# Patient Record
Sex: Female | Born: 1955
Health system: Southern US, Community
[De-identification: ages and names within clinical notes are randomized; demographics above are authoritative.]

## PROBLEM LIST (undated history)

## (undated) DIAGNOSIS — I499 Cardiac arrhythmia, unspecified: Secondary | ICD-10-CM

## (undated) DIAGNOSIS — K7581 Nonalcoholic steatohepatitis (NASH): Secondary | ICD-10-CM

## (undated) DIAGNOSIS — N390 Urinary tract infection, site not specified: Secondary | ICD-10-CM

## (undated) DIAGNOSIS — R200 Anesthesia of skin: Secondary | ICD-10-CM

## (undated) DIAGNOSIS — Z87442 Personal history of urinary calculi: Secondary | ICD-10-CM

## (undated) DIAGNOSIS — I10 Essential (primary) hypertension: Secondary | ICD-10-CM

## (undated) DIAGNOSIS — I8510 Secondary esophageal varices without bleeding: Secondary | ICD-10-CM

## (undated) DIAGNOSIS — N950 Postmenopausal bleeding: Secondary | ICD-10-CM

## (undated) DIAGNOSIS — R202 Paresthesia of skin: Secondary | ICD-10-CM

## (undated) DIAGNOSIS — M1990 Unspecified osteoarthritis, unspecified site: Secondary | ICD-10-CM

## (undated) DIAGNOSIS — M810 Age-related osteoporosis without current pathological fracture: Secondary | ICD-10-CM

## (undated) DIAGNOSIS — K7290 Hepatic failure, unspecified without coma: Secondary | ICD-10-CM

## (undated) DIAGNOSIS — E559 Vitamin D deficiency, unspecified: Secondary | ICD-10-CM

## (undated) DIAGNOSIS — J90 Pleural effusion, not elsewhere classified: Secondary | ICD-10-CM

## (undated) DIAGNOSIS — Z973 Presence of spectacles and contact lenses: Secondary | ICD-10-CM

## (undated) DIAGNOSIS — N200 Calculus of kidney: Secondary | ICD-10-CM

## (undated) DIAGNOSIS — K589 Irritable bowel syndrome without diarrhea: Secondary | ICD-10-CM

## (undated) DIAGNOSIS — Z8679 Personal history of other diseases of the circulatory system: Secondary | ICD-10-CM

## (undated) DIAGNOSIS — R112 Nausea with vomiting, unspecified: Secondary | ICD-10-CM

## (undated) DIAGNOSIS — R188 Other ascites: Secondary | ICD-10-CM

## (undated) DIAGNOSIS — K219 Gastro-esophageal reflux disease without esophagitis: Secondary | ICD-10-CM

## (undated) DIAGNOSIS — Z9889 Other specified postprocedural states: Secondary | ICD-10-CM

## (undated) DIAGNOSIS — Z9289 Personal history of other medical treatment: Secondary | ICD-10-CM

## (undated) HISTORY — DX: Other ascites: R18.8

## (undated) HISTORY — DX: Pleural effusion, not elsewhere classified: J90

## (undated) HISTORY — DX: Essential (primary) hypertension: I10

## (undated) HISTORY — PX: CARDIAC CATHETERIZATION: SHX172

## (undated) HISTORY — DX: Hepatic failure, unspecified without coma: K72.90

## (undated) HISTORY — DX: Secondary esophageal varices without bleeding: I85.10

## (undated) HISTORY — PX: UPPER GASTROINTESTINAL ENDOSCOPY: SHX188

## (undated) HISTORY — PX: TONSILLECTOMY AND ADENOIDECTOMY: SUR1326

## (undated) HISTORY — DX: Age-related osteoporosis without current pathological fracture: M81.0

---
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
## 1983-12-22 HISTORY — PX: TUBAL LIGATION: SHX77

## 1998-12-06 ENCOUNTER — Encounter: Payer: Self-pay | Admitting: Gastroenterology

## 1998-12-06 ENCOUNTER — Ambulatory Visit (HOSPITAL_COMMUNITY): Admission: RE | Admit: 1998-12-06 | Discharge: 1998-12-06 | Payer: Self-pay | Admitting: Gastroenterology

## 1999-11-28 ENCOUNTER — Encounter: Payer: Self-pay | Admitting: Family Medicine

## 1999-11-28 ENCOUNTER — Encounter: Admission: RE | Admit: 1999-11-28 | Discharge: 1999-11-28 | Payer: Self-pay | Admitting: Family Medicine

## 2002-09-29 ENCOUNTER — Encounter: Admission: RE | Admit: 2002-09-29 | Discharge: 2002-09-29 | Payer: Self-pay | Admitting: Family Medicine

## 2002-09-29 ENCOUNTER — Encounter: Payer: Self-pay | Admitting: Family Medicine

## 2003-12-22 HISTORY — PX: COLONOSCOPY: SHX174

## 2004-02-15 ENCOUNTER — Encounter: Admission: RE | Admit: 2004-02-15 | Discharge: 2004-02-15 | Payer: Self-pay | Admitting: Family Medicine

## 2004-02-26 ENCOUNTER — Ambulatory Visit (HOSPITAL_COMMUNITY): Admission: RE | Admit: 2004-02-26 | Discharge: 2004-02-26 | Payer: Self-pay | Admitting: Gastroenterology

## 2004-02-28 ENCOUNTER — Encounter: Admission: RE | Admit: 2004-02-28 | Discharge: 2004-02-28 | Payer: Self-pay | Admitting: Family Medicine

## 2005-06-30 ENCOUNTER — Ambulatory Visit (HOSPITAL_COMMUNITY): Admission: RE | Admit: 2005-06-30 | Discharge: 2005-06-30 | Payer: Self-pay | Admitting: Family Medicine

## 2006-07-27 ENCOUNTER — Encounter: Admission: RE | Admit: 2006-07-27 | Discharge: 2006-08-30 | Payer: Self-pay | Admitting: Family Medicine

## 2006-11-17 ENCOUNTER — Encounter: Admission: RE | Admit: 2006-11-17 | Discharge: 2006-11-17 | Payer: Self-pay | Admitting: Family Medicine

## 2008-12-21 HISTORY — PX: EXTRACORPOREAL SHOCK WAVE LITHOTRIPSY: SHX1557

## 2009-08-02 ENCOUNTER — Encounter: Admission: RE | Admit: 2009-08-02 | Discharge: 2009-08-02 | Payer: Self-pay | Admitting: Family Medicine

## 2009-08-07 ENCOUNTER — Encounter: Admission: RE | Admit: 2009-08-07 | Discharge: 2009-08-07 | Payer: Self-pay | Admitting: Family Medicine

## 2009-09-01 ENCOUNTER — Emergency Department (HOSPITAL_COMMUNITY): Admission: EM | Admit: 2009-09-01 | Discharge: 2009-09-01 | Payer: Self-pay | Admitting: Emergency Medicine

## 2009-09-05 ENCOUNTER — Ambulatory Visit (HOSPITAL_COMMUNITY): Admission: RE | Admit: 2009-09-05 | Discharge: 2009-09-05 | Payer: Self-pay | Admitting: Urology

## 2009-11-11 ENCOUNTER — Ambulatory Visit (HOSPITAL_COMMUNITY): Admission: RE | Admit: 2009-11-11 | Discharge: 2009-11-11 | Payer: Self-pay | Admitting: Urology

## 2010-02-13 DIAGNOSIS — I10 Essential (primary) hypertension: Secondary | ICD-10-CM | POA: Insufficient documentation

## 2010-02-14 ENCOUNTER — Ambulatory Visit: Payer: Self-pay

## 2010-02-14 ENCOUNTER — Ambulatory Visit: Payer: Self-pay | Admitting: Cardiology

## 2010-02-14 DIAGNOSIS — R002 Palpitations: Secondary | ICD-10-CM

## 2010-02-14 LAB — CONVERTED CEMR LAB
BUN: 9 mg/dL (ref 6–23)
Creatinine, Ser: 0.7 mg/dL (ref 0.4–1.2)
GFR calc non Af Amer: 92.87 mL/min (ref >60)
Glucose, Bld: 98 mg/dL (ref 70–99)
Potassium: 4.4 meq/L (ref 3.5–5.1)

## 2010-03-07 ENCOUNTER — Ambulatory Visit: Payer: Self-pay | Admitting: Cardiology

## 2010-03-07 ENCOUNTER — Encounter: Payer: Self-pay | Admitting: Cardiology

## 2010-03-07 ENCOUNTER — Ambulatory Visit: Payer: Self-pay

## 2010-03-07 ENCOUNTER — Ambulatory Visit (HOSPITAL_COMMUNITY): Admission: RE | Admit: 2010-03-07 | Discharge: 2010-03-07 | Payer: Self-pay | Admitting: Cardiology

## 2010-03-11 ENCOUNTER — Telehealth: Payer: Self-pay | Admitting: Cardiology

## 2010-03-17 ENCOUNTER — Encounter: Payer: Self-pay | Admitting: Internal Medicine

## 2010-03-17 ENCOUNTER — Telehealth (INDEPENDENT_AMBULATORY_CARE_PROVIDER_SITE_OTHER): Payer: Self-pay | Admitting: *Deleted

## 2010-04-04 ENCOUNTER — Ambulatory Visit: Payer: Self-pay | Admitting: Internal Medicine

## 2010-04-04 DIAGNOSIS — R0609 Other forms of dyspnea: Secondary | ICD-10-CM

## 2010-04-04 DIAGNOSIS — R0989 Other specified symptoms and signs involving the circulatory and respiratory systems: Secondary | ICD-10-CM

## 2010-04-07 ENCOUNTER — Ambulatory Visit: Payer: Self-pay | Admitting: Internal Medicine

## 2010-09-22 ENCOUNTER — Encounter: Admission: RE | Admit: 2010-09-22 | Discharge: 2010-09-22 | Payer: Self-pay | Admitting: Family Medicine

## 2010-10-14 ENCOUNTER — Emergency Department (HOSPITAL_BASED_OUTPATIENT_CLINIC_OR_DEPARTMENT_OTHER): Admission: EM | Admit: 2010-10-14 | Discharge: 2010-10-14 | Payer: Self-pay | Admitting: Emergency Medicine

## 2011-01-05 ENCOUNTER — Ambulatory Visit (HOSPITAL_COMMUNITY)
Admission: RE | Admit: 2011-01-05 | Discharge: 2011-01-05 | Payer: Self-pay | Source: Home / Self Care | Attending: Gastroenterology | Admitting: Gastroenterology

## 2011-01-12 ENCOUNTER — Encounter: Payer: Self-pay | Admitting: Family Medicine

## 2011-01-12 ENCOUNTER — Other Ambulatory Visit: Payer: Self-pay | Admitting: Gastroenterology

## 2011-01-20 NOTE — Progress Notes (Signed)
Summary: VT  Phone Note Outgoing Call Call back at [facility] Phone [phone]   Call placed by: Chanetta Marshall RN BSN,  March 11, 2010 5:14 PM Summary of Call: Patient had 4 beat run of NSVT on event monitor.  Dr Caryl Comes reviewed, patient to stop Amlodipine and start Verapamil ER 124m daily.  Pt to also have nightwatch monitor.  Pt aware of above. ACaremark RxRN BSN  March 11, 2010 5:15 PM   New Problems: PAROXYSMAL VENTRICULAR TACHYCARDIA (ICD-427.1)   New Problems: PAROXYSMAL VENTRICULAR TACHYCARDIA (ICD-427.1) New/Updated Medications: VERAPAMIL HCL CR 180 MG XR24H-CAP (VERAPAMIL HCL) Take one capsule by mouth daily Prescriptions: VERAPAMIL HCL CR 180 MG XR24H-CAP (VERAPAMIL HCL) Take one capsule by mouth daily  #30 x 11   Entered by:   AHotel manager  Authorized by:   BColin Mulders MD, [facility]  Signed by:   AChanetta MarshallRN BSN on 03/11/2010   Method used:   Electronically to        [facility]       [address]      [place]  [id number]      Ph: [phone]      Fax: [phone]  RxID:   [id number]

## 2011-01-20 NOTE — Assessment & Plan Note (Signed)
Summary: per crenshaw/amber   CC:  per crenshaw.  Pt put on verapamil but is still having irregular heartbeat per patient and this is making her very tired.  Marland Kitchen  History of Present Illness: Dawn Moon is seen at the request of Dr. Stanford Breed because of nonsustained ventricular tachycardia and atrial tachycardia associated with symptoms of lightheadedness.  She is a 55 year old woman with a 2-3 year history of irregular heartbeats. She describes these as floaters as well as some racing. Initially they would occur at a frequency of once every couple of months or so. Over recent months   there is an increasing frequency of these episodes occurring 3-4 days per week. They are associated with a sensation of being weak and tired. This can last for a number of hours after the  palpitations.  There is some flushing associated with this as well as weakness. There is no diaphoresis or nausea. Some lightheadedness accompanies this. She has some associations of pressure with her throat. She is also noted that these occur primarily in the afternoon and after eating. If she is eating a salad that day he is to have less symptoms. One of her more severe episodes recently occurred however, after having acute fillet grilled sandwich and a diet Coke. These episodes or not if it really associated with any chest discomfort with pressure both in her throat and going through to her back. Cardiac evaluation has included an ultrasound and a Myoview scan. both of which were negative. She also underwent a sleep study. This was negative event recording was also reviewed. She had multiple episodes of palpitations. Many of these were associated with sinus tachycardiaShe also had 2 episodes of tachycardia one nonsustained atrial tachycardia and the other nonsustained ventricular tachycardia. At least the latter of these was associated with weakness and fatigue that persisted for hours afterwards. She has some orthostatic intolerance.  She does not have shower intolerance. She has a history of syncopal  episode years and years ago when she had a viral illness/bronchitis. Her mom tried to help her with a steam bath and she passed out.  Also dysnpnea on exertion worseining    Current Medications (verified): 1)  Lisinopril 40 Mg Tabs (Lisinopril) .... Take One Tablet By Mouth Daily 2)  Verapamil Hcl Cr 180 Mg Xr24h-Cap (Verapamil Hcl) .... Take One Capsule By Mouth Daily 3)  Furosemide 20 Mg Tabs (Furosemide) .... Take One Tablet By Mouth Daily. 4)  Calcium 500 Mg Tabs (Calcium) .Marland Kitchen.. 1 Tab By Mouth Once Daily 5)  Potassium Chloride Crys Cr 20 Meq Cr-Tabs (Potassium Chloride Crys Cr) .Marland Kitchen.. 1 Tab By Mouth Once Daily  Allergies (verified): No Known Drug Allergies  Past History:  Past Medical History: Last updated: 02-20-2010 HYPERTENSION (ICD-401.9) Hyperlipidemia H/O elevated LFTs due to fatty infiltration Nephrolithiasis s/p lithotripsy  Past Surgical History: Last updated: 20-Feb-2010 Tonsillectomy and adenoidectomy Bilateral tubal ligation  Family History: Last updated: 20-Feb-2010 Mother died of lymphoma Father with CABG in his early 65s  Social History: Last updated: February 20, 2010 non-smoker no drug abuse Rare use of ETOH Full Time Single   Review of Systems       full review of systems was negative apart from a history of present illness and past medical history.   Vital Signs:  Patient profile:   55 year old female Height:      65 inches Weight:      190 pounds BMI:     31.73 Pulse rate:   83 / minute Pulse  rhythm:   regular BP sitting:   150 / 90  (left arm) Cuff size:   large  Vitals Entered By: Doug Sou CMA (April 04, 2010 4:15 PM)  Physical Exam  General:  Alert and oriented moderately obese Caucasian female appearing her stated age in no acute distress. HEENT  normal . Neck veins were flat; carotids brisk and full without bruits. No lymphadenopathy. Back without kyphosis. Lungs  clear. Heart sounds regular without murmurs or gallops. PMI nondisplaced. Abdomen soft with active bowel sounds without midline pulsation or hepatomegaly. Femoral pulses and distal pulses intact. Extremities were without clubbing cyanosis or edemaSkin warm and dry. Neurological exam grossly normal;  affect is engaging    EKG  Procedure date:  04/04/2010  Findings:      sinus rhythm at 83 Intervals 0.12/0.08/23 Poor R-wave progression   Impression & Recommendations:  Problem # 1:  PALPITATIONS (ICD-785.1) the patient has palpitations associated with monitoring demonstrating sinus rhythm as well as sinus tach nonsustained atrial tach and nonsustained ventricular tach. This raises the possibility that her palpitations and her accompanying weakness are related via the autonomic nervous system. she also has postprandial fatigue. If this is in fact the length of her symptoms antiadrenergic therapy might be more effective than calcium blocker therapy. To that end we will try her on beta blockers.  I've also asked her to try and see if she can clarify the time of day of her symptoms as the worst ones seem to be in the afternoon following eating and to that end I have suggested that she try to snack during the day as opposed to eat large meals.    Verapamil Hcl Cr 180 Mg Xr24h-cap (Verapamil hcl) .Marland Kitchen... Take one capsule by mouth daily Her updated medication list for this problem includes:    Lisinopril 40 Mg Tabs (Lisinopril) .Marland Kitchen... Take one tablet by mouth daily  Problem # 2:  DYSPNEA ON EXERTION (ICD-786.09) exertional dyspnea is also concerned. The nonsustained ventricular tachycardia systemic processes are of some consideration. Nothing was seen by echo or Myoview scanning. MRI scanning signal average ECG might be of some value to further demonstrate the absence of structural heart disease. I have suggested we get an chest x-ray looking both reported processes as well as hilar adenopathy. The  following medications were removed from the medication list:    Verapamil Hcl Cr 180 Mg Xr24h-cap (Verapamil hcl) .Marland Kitchen... Take one capsule by mouth daily Her updated medication list for this problem includes:    Lisinopril 40 Mg Tabs (Lisinopril) .Marland Kitchen... Take one tablet by mouth daily    Furosemide 20 Mg Tabs (Furosemide) .Marland Kitchen... Take one tablet by mouth daily.  Problem # 3:  VENTRICULAR TACHYCARDIA- NONSUSTAINED (ICD-427.1) I don't know the mechanism of this. The axis would suggest that it is superior in origin. It was for this reason of rhabdo was initially tried. At this point we have no underlying evidence of structural heart disease. Please see above discussion The following medications were removed from the medication list:    Verapamil Hcl Cr 180 Mg Xr24h-cap (Verapamil hcl) .Marland Kitchen... Take one capsule by mouth daily Her updated medication list for this problem includes:    Lisinopril 40 Mg Tabs (Lisinopril) .Marland Kitchen... Take one tablet by mouth daily  Problem # 4:  HYPERTENSION (ICD-401.9) beta blockers may augment blood pressure control. The following medications were removed from the medication list:    Verapamil Hcl Cr 180 Mg Xr24h-cap (Verapamil hcl) .Marland Kitchen... Take one capsule by mouth  daily Her updated medication list for this problem includes:    Lisinopril 40 Mg Tabs (Lisinopril) .Marland Kitchen... Take one tablet by mouth daily    Furosemide 20 Mg Tabs (Furosemide) .Marland Kitchen... Take one tablet by mouth daily.  Other Orders: EKG w/ Interpretation (93000)  Patient Instructions: 1)  A chest x-ray takes a picture of the organs and structures inside the chest, including the heart, lungs, and blood vessels. This test can show several things, including, whether the heart is enlarged; whether fluid is building up in the lungs; and whether pacemaker / defibrillator leads are still in place. 2)  Your physician has recommended you make the following change in your medication: Stop Verapamil.  Try the 3 beta blockers one at a  time.  Let us know which one you like best.    Appended Document: per crenshaw/amber aorhtostatics were done and were normal

## 2011-01-20 NOTE — Procedures (Signed)
Summary: Respiration Report  Respiration Report   Imported By: Sallee Provencal 04/28/2010 13:03:53  _____________________________________________________________________  External Attachment:    Type:   Image     Comment:   External Document

## 2011-01-20 NOTE — Miscellaneous (Signed)
Summary: Orders Update  Clinical Lists Changes  Orders: Added new Test order of T-2 View CXR (71020TC) - Signed 

## 2011-01-20 NOTE — Progress Notes (Signed)
  Faxed all Cardaic over to Lorin Glass w/ [facility] to fax [place]  March 17, 2010 10:51 AM

## 2011-01-20 NOTE — Assessment & Plan Note (Signed)
Summary: np6/heart fluttering/dm   CC:  pt complains of a flutter feeling.  History of Present Illness: 55 year old female with no prior cardiac history for evaluation of palpitations. She typically does not have dyspnea on exertion, orthopnea, or exertional chest pain. She does occasionally have mild pedal edema. For the past several years she has had intermittent palpitations. They are sudden in onset and described as her heart fluttering and racing. They last anywhere from seconds to one minute. They are becoming more frequent. There is associated throat tightness and shortness of breath as well as mild dizziness. She has not had syncope. They resolved spontaneously. She does feel somewhat tired and weak afterwards. Because of the above we were asked to further evaluate.  Current Medications (verified): 1)  Lisinopril 40 Mg Tabs (Lisinopril) .... Take One Tablet By Mouth Daily 2)  Amlodipine Besylate 2.5 Mg Tabs (Amlodipine Besylate) .... Take One Tablet By Mouth Daily 3)  Furosemide 20 Mg Tabs (Furosemide) .... Take One Tablet By Mouth Daily. 4)  Calcium 500 Mg Tabs (Calcium) .Marland Kitchen.. 1 Tab By Mouth Once Daily 5)  Potassium Chloride Crys Cr 20 Meq Cr-Tabs (Potassium Chloride Crys Cr) .Marland Kitchen.. 1 Tab By Mouth Once Daily  Past History:  Past Medical History: HYPERTENSION (ICD-401.9) Hyperlipidemia H/O elevated LFTs due to fatty infiltration Nephrolithiasis s/p lithotripsy  Past Surgical History: Tonsillectomy and adenoidectomy Bilateral tubal ligation  Family History: Reviewed history and no changes required. Mother died of lymphoma Father with CABG in his early 88s  Social History: Reviewed history from 02/13/2010 and no changes required. non-smoker no drug abuse Rare use of ETOH Full Time Single   Review of Systems       Occasional dry cough but no fevers or chills, productive cough, hemoptysis, dysphasia, odynophagia, melena, hematochezia, dysuria, hematuria, rash, seizure  activity, orthopnea, PND,  claudication. Remaining systems are negative.   Vital Signs:  Patient profile:   55 year old female Height:      65 inches Weight:      182 pounds BMI:     30.40 Pulse rate:   101 / minute Resp:     12 per minute BP sitting:   140 / 94  (left arm)  Vitals Entered By: Burnett Kanaris (February 14, 2010 9:22 AM)  Physical Exam  General:  Well developed/well nourished in NAD Skin warm/dry Patient not depressed No peripheral clubbing Back-normal HEENT-normal/normal eyelids Neck supple/normal carotid upstroke bilaterally; no bruits; no JVD; no thyromegaly chest - CTA/ normal expansion CV - RRR/normal S1 and S2; no murmurs, rubs or gallops;  PMI nondisplaced Abdomen -NT/ND, no HSM, no mass, + bowel sounds, no bruit 2+ femoral pulses, no bruits Ext-trace edema, chords, 2+ DP; some varicosities Neuro-grossly nonfocal     EKG  Procedure date:  02/14/2010  Findings:      Sinus rhythm at a rate of 95. No ST changes.  Impression & Recommendations:  Problem # 1:  PALPITATIONS (ICD-785.1) Etiology unclear. Symptoms sound potentially secondary to an SVT. I will schedule a CardioNet monitor to more fully evaluate. Check TSH. Her updated medication list for this problem includes:    Lisinopril 40 Mg Tabs (Lisinopril) .Marland Kitchen... Take one tablet by mouth daily    Amlodipine Besylate 2.5 Mg Tabs (Amlodipine besylate) .Marland Kitchen... Take one tablet by mouth daily  Orders: Stress Echo (Stress Echo) EKG w/ Interpretation (93000) TLB-TSH (Thyroid Stimulating Hormone) (84443-TSH) Event (Event)  Problem # 2:  CHEST PAIN (ICD-786.50) She does have some tightness in her throat with  these episodes. I will schedule a stress echocardiogram both to quantify LV function and to exclude ischemia. Her updated medication list for this problem includes:    Lisinopril 40 Mg Tabs (Lisinopril) .Marland Kitchen... Take one tablet by mouth daily    Amlodipine Besylate 2.5 Mg Tabs (Amlodipine  besylate) .Marland Kitchen... Take one tablet by mouth daily  Problem # 3:  HYPERTENSION (ICD-401.9) Blood pressure mildly elevated. She will follow this and we will add additional medications as needed. She may benefit from a beta blocker both for her hypertension and palpitations pending CardioNet results. Check bmet. Her updated medication list for this problem includes:    Lisinopril 40 Mg Tabs (Lisinopril) .Marland Kitchen... Take one tablet by mouth daily    Amlodipine Besylate 2.5 Mg Tabs (Amlodipine besylate) .Marland Kitchen... Take one tablet by mouth daily    Furosemide 20 Mg Tabs (Furosemide) .Marland Kitchen... Take one tablet by mouth daily.  Orders: TLB-BMP (Basic Metabolic Panel-BMET) ([id number]-[id number])  Patient Instructions: 1)  Your physician recommends that you schedule a follow-up appointment in: 6 WEEKS 2)  Your physician has requested that you have a stress echocardiogram. For further information please visit [email or web address].  Please follow instruction sheet as given. 3)  Your physician has recommended that you wear an event monitor.  Event monitors are medical devices that record the heart's electrical activity. Doctors most often use these monitors to diagnose arrhythmias. Arrhythmias are problems with the speed or rhythm of the heartbeat. The monitor is a small, portable device. You can wear one while you do your normal daily activities. This is usually used to diagnose what is causing palpitations/syncope (passing out).

## 2011-03-25 LAB — BASIC METABOLIC PANEL
BUN: 7 mg/dL (ref 6–23)
Creatinine, Ser: 0.63 mg/dL (ref 0.4–1.2)
GFR calc non Af Amer: >60 mL/min (ref >60)
Glucose, Bld: 103 mg/dL — ABNORMAL HIGH (ref 70–99)
Potassium: 3.4 mEq/L — ABNORMAL LOW (ref 3.5–5.1)

## 2011-03-25 LAB — CBC
HCT: 37 % (ref 36.0–46.0)
MCV: 78.6 fL (ref 78.0–100.0)
Platelets: 271 10*3/uL (ref 150–400)
RDW: 15.3 % (ref 11.5–15.5)

## 2011-03-27 LAB — DIFFERENTIAL
Basophils Relative: 0 % (ref 0–1)
Eosinophils Absolute: 0.2 10*3/uL (ref 0.0–0.7)
Monocytes Absolute: 0.9 10*3/uL (ref 0.1–1.0)
Monocytes Relative: 7 % (ref 3–12)
Neutro Abs: 9.3 10*3/uL — ABNORMAL HIGH (ref 1.7–7.7)

## 2011-03-27 LAB — CBC
Hemoglobin: 12.5 g/dL (ref 12.0–15.0)
MCHC: 32.2 g/dL (ref 30.0–36.0)
MCV: 80 fL (ref 78.0–100.0)
RBC: 4.84 MIL/uL (ref 3.87–5.11)

## 2011-03-27 LAB — BASIC METABOLIC PANEL
CO2: 28 mEq/L (ref 19–32)
Chloride: 108 mEq/L (ref 96–112)
GFR calc Af Amer: >60 mL/min (ref >60)
Potassium: 4 mEq/L (ref 3.5–5.1)
Sodium: 141 mEq/L (ref 135–145)

## 2011-03-27 LAB — URINALYSIS, ROUTINE W REFLEX MICROSCOPIC
Nitrite: NEGATIVE
Specific Gravity, Urine: 1.025 (ref 1.005–1.030)
Urobilinogen, UA: 0.2 mg/dL (ref 0.0–1.0)

## 2011-05-08 NOTE — Op Note (Signed)
NAME:  Dawn Moon, Dawn Moon                        ACCOUNT NO.:  [id number]   MEDICAL RECORD NO.:  [id number]                   PATIENT TYPE:  AMB   LOCATION:  ENDO                                 FACILITY:  [facility]   PHYSICIAN:  James L. Rolla Flatten., M.D.          DATE OF BIRTH:  1956/02/24   DATE OF PROCEDURE:  02/26/2004  DATE OF DISCHARGE:                                 OPERATIVE REPORT   PROCEDURE:  Colonoscopy.   MEDICATIONS:  Fentanyl 100 mcg, Versed 10 mg IV.   SCOPE:  Pediatric Olympus colonoscope.   INDICATIONS:  Family history of colon cancer.   DESCRIPTION OF PROCEDURE:  The procedure was explained to the patient and  consent obtained.  With the patient in the left lateral decubitus position,  an Olympus scope was inserted in advance.  Prep was excellent, and we were  able to reach the cecum easily.  The ileocecal valve and appendiceal orifice  were seen.  The scope was withdrawn, and the cecum, ascending colon,  transverse colon, descending and sigmoid colon were seen fine, and there  were no polyps or lesions.  No significant diverticular disease.  The scope  was withdrawn down into the rectum.  The rectum was free of polyps.  The  scope was withdrawn.  The patient tolerated the procedure well.  She was  monitored throughout and given supplemental oxygen.   ASSESSMENT:  1. Family history of colon cancer.  V16.0.  2. Normal colonoscopy.   PLAN:  We will recommend yearly Hemoccults.  Repeat the procedure in five  years.                                               James L. Rolla Flatten., M.D.    [name]  D:  02/26/2004  T:  02/26/2004  Job:  [id number]   cc:   Youlanda Roys. Deatra Ina, M.D.  [address]  [place]  [place] [address]  Fax: [phone]

## 2011-06-09 ENCOUNTER — Encounter: Payer: Self-pay | Admitting: Cardiology

## 2011-07-03 ENCOUNTER — Encounter: Payer: Self-pay | Admitting: Physician Assistant

## 2011-07-03 ENCOUNTER — Ambulatory Visit (INDEPENDENT_AMBULATORY_CARE_PROVIDER_SITE_OTHER): Payer: 59 | Admitting: Physician Assistant

## 2011-07-03 DIAGNOSIS — I10 Essential (primary) hypertension: Secondary | ICD-10-CM

## 2011-07-03 DIAGNOSIS — R42 Dizziness and giddiness: Secondary | ICD-10-CM | POA: Insufficient documentation

## 2011-07-03 MED ORDER — MECLIZINE HCL 25 MG PO TABS: ORAL_TABLET | ORAL | Status: DC

## 2011-07-03 MED ORDER — ATENOLOL 50 MG PO TABS: 50.0000 mg | ORAL_TABLET | Freq: Two times a day (BID) | ORAL | Status: DC

## 2011-07-03 NOTE — Assessment & Plan Note (Signed)
I suspect she has benign positional vertigo.  I will give her meclizine 25 mg 1/2-1 tab Q 8 hrs prn.  She will be referred to vestibular rehab at [facility] outpatient rehab.  She has an appointment with ENT already.  I have recommend she keep that appointment.

## 2011-07-03 NOTE — Patient Instructions (Signed)
Your physician recommends that you schedule a follow-up appointment in: [place] physician has recommended you make the following change in your medication: MECLIZINE 25 MG TAKE 1/2-1 TABLET EVERY 8 HOURS AS NEEDED FOR DIZZINESS; INCREASING ATENOLOL 50 MG 1 TABLET TWICE DAILY, IF YOU CANNOT TOLERATE THIS INCREASE GO BACK TO ONCE DAILY AND START NORVASC 5 MG ONCE DAILY.  WE WILL CALL YOU WITH AN APPOINTMENT FOR VESTIBULAR REHAB FOR VERTIGO.

## 2011-07-03 NOTE — Progress Notes (Signed)
History of Present Illness: Primary Cardiologist: Dr. Kirk Ruths  Dawn Moon is a 55 y.o. female with a h/o HTN and HLP who saw Dr. Stanford Breed for palpitations in 2/11.  GXT echo at that time was negative for ischemia.  Monitor demonstrated NSVT and atrial tachy.  She saw Dr. Caryl Comes who recommended putting her on a beta blocker.  She presents today for HTN and dizziness.  She has been dizzy for a couple months.  It continues to get worse.  She says she feels like she is on a boat.  Loses her balance.  Has not fallen.  No palpitations.  No syncope or near syncope.  No symptoms like she had previously with atrial tach and NSVT.  No dyspnea or chest pain.  No recent URI.  Did take a trip on a plane in May.  Symptoms started shortly after.  She has an appt with ENT later this month.  Recent labs from her PCP demonstrated a creatinine of 0.81, potassium 4.2, TSH 0.82. Running blood pressures brought in by the patient range from 120/80 to 188/108.  Past Medical History  Diagnosis Date  . HTN (hypertension)     GXT echo in 2/11: no ischemia, EF 55%  . HLD (hyperlipidemia)   . Fatty liver     elevated LFTs in past  . NSVT (nonsustained ventricular tachycardia)     seen by Dr. Caryl Comes in past  . Atrial tachycardia, paroxysmal     seen by Dr. Caryl Comes in past    Current Outpatient Prescriptions  Medication Sig Dispense Refill  . atenolol (TENORMIN) 50 MG tablet Take 50 mg by mouth daily.        . furosemide (LASIX) 20 MG tablet Take 20 mg by mouth daily.        Marland Kitchen lisinopril (PRINIVIL,ZESTRIL) 40 MG tablet Take 40 mg by mouth daily.        . potassium citrate (UROCIT-K) 10 MEQ (1080 MG) SR tablet Take 10 mEq by mouth 2 (two) times daily.          Allergies: No Known Allergies  Social history:  She is a Armed forces operational officer.  She is a nonsmoker.  ROS:  See history of present illness.  No unusual headaches.  No double vision or vision loss.  No tinnitus or hearing loss.  Denies fevers, chills,  cough.  Denies melena or hematochezia.  All other systems reviewed and negative.  Vital Signs: BP 153/94  Pulse 84  Resp 18  Ht 5' 4"  (1.626 m)  Wt 191 lb (86.637 kg)  BMI 32.79 kg/m2  PHYSICAL EXAM: Well nourished, well developed, in no acute distress HEENT: normal Neck: no JVD Vascular: No carotid bruits Endocrine: No thyromegaly Cardiac:  normal S1, S2; RRR; no murmur Lungs:  clear to auscultation bilaterally, no wheezing, rhonchi or rales Abd: soft, nontender, no hepatomegaly Ext: no edema Skin: warm and dry Neuro:  CNs 2-12 intact, no focal abnormalities noted;  I attempted a Dix-Hallpike maneuver.  The patient did not demonstrate any noticeable nystagmus.  She did not get dizzy with lying down.  However, she did get significantly dizzy with sitting up.  EKG:  Sinus rhythm, heart rate 83, normal axis, nonspecific ST-T wave changes  ASSESSMENT AND PLAN:

## 2011-07-03 NOTE — Assessment & Plan Note (Addendum)
I believe that her her elevated blood pressure is probably being driven by her dizziness.  I suspect her dizziness is benign positional vertigo.  She had trouble with increasing atenolol to 100 mg a day.  It made her more dizzy.  I will have her try 50 mg BID.  If she cannot tolerate this, she will go back to 50 mg QD and start on Norvasc 5 mg QD.  I gave her a prescription to take with her.  She is leaving for the beach tomorrow for a week.

## 2011-07-17 ENCOUNTER — Other Ambulatory Visit: Payer: Self-pay | Admitting: *Deleted

## 2011-07-17 NOTE — Telephone Encounter (Signed)
lmom for ptcb to very dose of norvasc being asked for, will talk to scott weaver, pa-c on 07/20/11. Julaine Hua

## 2011-07-20 ENCOUNTER — Other Ambulatory Visit: Payer: Self-pay | Admitting: *Deleted

## 2011-07-20 MED ORDER — AMLODIPINE BESYLATE 10 MG PO TABS: 10.0000 mg | ORAL_TABLET | Freq: Every day | ORAL | Status: DC

## 2011-07-20 NOTE — Telephone Encounter (Signed)
rx sent for amlodipine 10 mg to [facility] [address] today. Dawn Moon Pt aware of rx sent today. Dawn Moon

## 2011-07-23 ENCOUNTER — Ambulatory Visit: Payer: 59 | Admitting: Physical Therapy

## 2011-07-27 ENCOUNTER — Other Ambulatory Visit (INDEPENDENT_AMBULATORY_CARE_PROVIDER_SITE_OTHER): Payer: Self-pay | Admitting: Otolaryngology

## 2011-07-27 ENCOUNTER — Ambulatory Visit: Payer: 59 | Attending: Physician Assistant | Admitting: Physical Therapy

## 2011-07-27 DIAGNOSIS — R269 Unspecified abnormalities of gait and mobility: Secondary | ICD-10-CM | POA: Insufficient documentation

## 2011-07-27 DIAGNOSIS — H814 Vertigo of central origin: Secondary | ICD-10-CM

## 2011-07-27 DIAGNOSIS — R42 Dizziness and giddiness: Secondary | ICD-10-CM | POA: Insufficient documentation

## 2011-07-27 DIAGNOSIS — IMO0001 Reserved for inherently not codable concepts without codable children: Secondary | ICD-10-CM | POA: Insufficient documentation

## 2011-07-31 ENCOUNTER — Ambulatory Visit
Admission: RE | Admit: 2011-07-31 | Discharge: 2011-07-31 | Disposition: A | Discharge status: Home / Self Care | Payer: 59 | Source: Ambulatory Visit | Attending: Otolaryngology | Admitting: Otolaryngology

## 2011-07-31 DIAGNOSIS — H814 Vertigo of central origin: Secondary | ICD-10-CM

## 2011-07-31 MED ORDER — GADOBENATE DIMEGLUMINE 529 MG/ML IV SOLN
18.0000 mL | Freq: Once | INTRAVENOUS | Status: AC | PRN
  Administered 2011-07-31: 18 mL via INTRAVENOUS

## 2011-08-03 ENCOUNTER — Encounter: Payer: 59 | Admitting: Physical Therapy

## 2011-08-10 ENCOUNTER — Ambulatory Visit: Payer: 59 | Admitting: Physical Therapy

## 2011-08-19 ENCOUNTER — Ambulatory Visit: Payer: 59 | Admitting: Physical Therapy

## 2011-08-25 ENCOUNTER — Encounter: Payer: 59 | Admitting: Physical Therapy

## 2011-09-07 ENCOUNTER — Encounter: Payer: 59 | Admitting: Physical Therapy

## 2011-12-30 ENCOUNTER — Telehealth: Payer: Self-pay | Admitting: *Deleted

## 2011-12-30 NOTE — Telephone Encounter (Signed)
Pt dtr amber asked me to call the pt about symptoms she is having. Spoke with pt, she states she was having some occ chest pain but now it seems to be occuring daily. This can occur with rest or with exertion. She is having SOB. She also describes palpitations that occur 1-2 times weekly. Cough or holding her breath will help the palpitations. She reports this is similar to the problems she was having the last time she was seen and the atenolol helped at that time. She has an appt with dr Stanford Breed 01-19-12. Will discuss with dr Stanford Breed

## 2011-12-31 NOTE — Telephone Encounter (Signed)
Pt issues discussed with dr Stanford Breed, talked to pt and appt moved up to the 16th

## 2012-01-06 ENCOUNTER — Encounter: Payer: Self-pay | Admitting: Cardiology

## 2012-01-06 ENCOUNTER — Ambulatory Visit (INDEPENDENT_AMBULATORY_CARE_PROVIDER_SITE_OTHER): Payer: 59 | Admitting: Cardiology

## 2012-01-06 DIAGNOSIS — R079 Chest pain, unspecified: Secondary | ICD-10-CM

## 2012-01-06 DIAGNOSIS — I10 Essential (primary) hypertension: Secondary | ICD-10-CM

## 2012-01-06 DIAGNOSIS — R002 Palpitations: Secondary | ICD-10-CM

## 2012-01-06 LAB — BASIC METABOLIC PANEL
BUN: 11 mg/dL (ref 6–23)
Chloride: 108 mEq/L (ref 96–112)
Creatinine, Ser: 0.8 mg/dL (ref 0.4–1.2)

## 2012-01-06 MED ORDER — ATENOLOL 50 MG PO TABS: 50.0000 mg | ORAL_TABLET | Freq: Two times a day (BID) | ORAL | Status: DC

## 2012-01-06 NOTE — Assessment & Plan Note (Signed)
Symptoms somewhat atypical but multiple risk factors. Previous stress echocardiogram showed endocardium that was difficult to visualize and nuclear study recommended a followup needed. Will arrange Myoview for risk stratification and to quantify LV function.

## 2012-01-06 NOTE — Patient Instructions (Signed)
Your physician recommends that you schedule a follow-up appointment in: [place] has requested that you have en exercise stress myoview. For further information please visit [email or web address]. Please follow instruction sheet, as given.   INCREASE ATENOLOL TO 50 MG TWICE DAILY

## 2012-01-06 NOTE — Progress Notes (Signed)
   HPI: Pleasant female I initially saw in Feb of 2011 for palpitations. Stress echocardiogram in March of 2011 was normal. TSH and potassium normal. CardioNet showed 4 beats of nonsustained ventricular tachycardia; also brief atrial tachycardia; note some of symptoms also noted to be sinus tachycardia. Patient was seen by Dr. Caryl Comes and started on verapamil. Her symptoms did not improve and she was changed to a beta blocker. Her symptoms improved with atenolol. Note previous sleep study negative. Over the last 2 months her palpitations have worsened. They are more frequent but not as severe as previous. They are sudden in onset and described as a flutter. They last several seconds and resolves spontaneously. No associated presyncope or syncope. When she has her palpitations she also has chest tightness. She does not have this with exertion. She has some dyspnea on exertion with more extreme activities but denies orthopnea or PND. No pedal edema.  Current Outpatient Prescriptions  Medication Sig Dispense Refill  . atenolol (TENORMIN) 50 MG tablet Take 1 tablet (50 mg total) by mouth 2 (two) times daily.  180 tablet  4  . furosemide (LASIX) 20 MG tablet Take 20 mg by mouth daily.        Marland Kitchen lisinopril (PRINIVIL,ZESTRIL) 40 MG tablet Take 40 mg by mouth daily.        . potassium citrate (UROCIT-K) 10 MEQ (1080 MG) SR tablet Take 10 mEq by mouth 2 (two) times daily.           Past Medical History  Diagnosis Date  . HTN (hypertension)     GXT echo in 2/11: no ischemia, EF 55%  . HLD (hyperlipidemia)   . Fatty liver     elevated LFTs in past  . NSVT (nonsustained ventricular tachycardia)     seen by Dr. Caryl Comes in past  . Atrial tachycardia, paroxysmal     seen by Dr. Caryl Comes in past  . Nephrolithiasis     No past surgical history on file.  History   Social History  . Marital Status: Widowed    Spouse Name: N/A    Number of Children: N/A  . Years of Education: N/A   Occupational History  .  Not on file.   Social History Main Topics  . Smoking status: Never Smoker   . Smokeless tobacco: Not on file  . Alcohol Use: Yes     Rare  . Drug Use: No  . Sexually Active: Not on file   Other Topics Concern  . Not on file   Social History Narrative  . No narrative on file    ROS: no fevers or chills, productive cough, hemoptysis, dysphasia, odynophagia, melena, hematochezia, dysuria, hematuria, rash, seizure activity, orthopnea, PND, pedal edema, claudication. Remaining systems are negative.  Physical Exam: Well-developed well-nourished in no acute distress.  Skin is warm and dry.  HEENT is normal.  Neck is supple. No thyromegaly.  Chest is clear to auscultation with normal expansion.  Cardiovascular exam is regular rate and rhythm.  Abdominal exam nontender or distended. No masses palpated. Extremities show no edema. neuro grossly intact  ECG sinus rhythm at a rate of 69. Axis normal. No ST changes.

## 2012-01-06 NOTE — Assessment & Plan Note (Addendum)
Previous monitor showed sinus tachycardia, brief paroxysmal atrial tachycardia, and brief nonsustained ventricular tachycardia. Her symptoms are similar to previous but less severe. Plan increase atenolol to 50 mg twice a day. Note LV function normal previously. Check potassium and magnesium. Obtain most recent TSH from her primary care. If her symptoms persist will arrange repeat monitor. No history of syncope. No family history of sudden death. Echocardiogram to reassess LV function.

## 2012-01-06 NOTE — Assessment & Plan Note (Signed)
Blood pressure elevated. Increase atenolol to 50 mg twice a day.

## 2012-01-10 ENCOUNTER — Encounter (HOSPITAL_BASED_OUTPATIENT_CLINIC_OR_DEPARTMENT_OTHER): Payer: Self-pay | Admitting: Emergency Medicine

## 2012-01-10 ENCOUNTER — Other Ambulatory Visit: Payer: Self-pay

## 2012-01-10 ENCOUNTER — Inpatient Hospital Stay (HOSPITAL_BASED_OUTPATIENT_CLINIC_OR_DEPARTMENT_OTHER)
Admission: EM | Admit: 2012-01-10 | Discharge: 2012-01-11 | DRG: 287 | Disposition: A | Discharge status: Home / Self Care | Payer: 59 | Attending: Cardiology | Admitting: Cardiology

## 2012-01-10 ENCOUNTER — Emergency Department (INDEPENDENT_AMBULATORY_CARE_PROVIDER_SITE_OTHER): Payer: 59

## 2012-01-10 DIAGNOSIS — I10 Essential (primary) hypertension: Secondary | ICD-10-CM | POA: Insufficient documentation

## 2012-01-10 DIAGNOSIS — E785 Hyperlipidemia, unspecified: Secondary | ICD-10-CM | POA: Diagnosis present

## 2012-01-10 DIAGNOSIS — N200 Calculus of kidney: Secondary | ICD-10-CM | POA: Insufficient documentation

## 2012-01-10 DIAGNOSIS — R0789 Other chest pain: Principal | ICD-10-CM | POA: Diagnosis present

## 2012-01-10 DIAGNOSIS — D72829 Elevated white blood cell count, unspecified: Secondary | ICD-10-CM | POA: Diagnosis present

## 2012-01-10 DIAGNOSIS — R079 Chest pain, unspecified: Secondary | ICD-10-CM

## 2012-01-10 DIAGNOSIS — I472 Ventricular tachycardia: Secondary | ICD-10-CM | POA: Insufficient documentation

## 2012-01-10 DIAGNOSIS — I200 Unstable angina: Secondary | ICD-10-CM

## 2012-01-10 DIAGNOSIS — R7309 Other abnormal glucose: Secondary | ICD-10-CM | POA: Diagnosis present

## 2012-01-10 DIAGNOSIS — R002 Palpitations: Secondary | ICD-10-CM | POA: Insufficient documentation

## 2012-01-10 LAB — BASIC METABOLIC PANEL
BUN: 9 mg/dL (ref 6–23)
Calcium: 9.4 mg/dL (ref 8.4–10.5)
GFR calc Af Amer: >90 mL/min (ref >90)
GFR calc non Af Amer: 81 mL/min — ABNORMAL LOW (ref >90)
Potassium: 3.6 mEq/L (ref 3.5–5.1)
Sodium: 140 mEq/L (ref 135–145)

## 2012-01-10 LAB — URINALYSIS, ROUTINE W REFLEX MICROSCOPIC
Bilirubin Urine: NEGATIVE
Nitrite: NEGATIVE
Specific Gravity, Urine: 1.01 (ref 1.005–1.030)
Urobilinogen, UA: 0.2 mg/dL (ref 0.0–1.0)

## 2012-01-10 LAB — MRSA PCR SCREENING: MRSA by PCR: NEGATIVE

## 2012-01-10 LAB — CBC
MCH: 30 pg (ref 26.0–34.0)
MCHC: 35.2 g/dL (ref 30.0–36.0)
RDW: 13.7 % (ref 11.5–15.5)

## 2012-01-10 LAB — CARDIAC PANEL(CRET KIN+CKTOT+MB+TROPI): Troponin I: <0.3 ng/mL (ref <0.30)

## 2012-01-10 MED ORDER — ASPIRIN 81 MG PO TABS: 81.0000 mg | ORAL_TABLET | Freq: Every day | ORAL | Status: DC

## 2012-01-10 MED ORDER — NITROGLYCERIN 0.4 MG SL SUBL: 0.4000 mg | SUBLINGUAL_TABLET | SUBLINGUAL | Status: DC | PRN

## 2012-01-10 MED ORDER — SODIUM CHLORIDE 0.9 % IV SOLN
250.0000 mL | INTRAVENOUS | Status: DC | PRN
  Administered 2012-01-10: 250 mL via INTRAVENOUS

## 2012-01-10 MED ORDER — SODIUM CHLORIDE 0.9 % IJ SOLN: 3.0000 mL | Freq: Two times a day (BID) | INTRAMUSCULAR | Status: DC

## 2012-01-10 MED ORDER — ASPIRIN 81 MG PO CHEW
324.0000 mg | CHEWABLE_TABLET | Freq: Once | ORAL | Status: AC
  Administered 2012-01-10: 324 mg via ORAL
  Filled 2012-01-10: qty 4

## 2012-01-10 MED ORDER — POTASSIUM CITRATE ER 10 MEQ (1080 MG) PO TBCR
10.0000 meq | EXTENDED_RELEASE_TABLET | Freq: Two times a day (BID) | ORAL | Status: DC
  Administered 2012-01-10: 10 meq via ORAL
  Filled 2012-01-10 (×3): qty 1

## 2012-01-10 MED ORDER — NITROGLYCERIN IN D5W 200-5 MCG/ML-% IV SOLN: 5.0000 ug/min | INTRAVENOUS | Status: DC

## 2012-01-10 MED ORDER — ACETAMINOPHEN 325 MG PO TABS
650.0000 mg | ORAL_TABLET | Freq: Once | ORAL | Status: AC
  Administered 2012-01-10: 650 mg via ORAL
  Filled 2012-01-10: qty 2

## 2012-01-10 MED ORDER — LISINOPRIL 40 MG PO TABS
40.0000 mg | ORAL_TABLET | Freq: Every day | ORAL | Status: DC
  Administered 2012-01-11: 40 mg via ORAL
  Filled 2012-01-10 (×2): qty 1

## 2012-01-10 MED ORDER — NITROGLYCERIN 0.4 MG SL SUBL
0.4000 mg | SUBLINGUAL_TABLET | SUBLINGUAL | Status: DC | PRN
  Administered 2012-01-10 (×3): 0.4 mg via SUBLINGUAL
  Filled 2012-01-10: qty 25

## 2012-01-10 MED ORDER — ALPRAZOLAM 0.25 MG PO TABS: 0.2500 mg | ORAL_TABLET | Freq: Two times a day (BID) | ORAL | Status: DC | PRN

## 2012-01-10 MED ORDER — SODIUM CHLORIDE 0.9 % IV SOLN
1.0000 mL/kg/h | INTRAVENOUS | Status: DC
  Administered 2012-01-11: 1 mL/kg/h via INTRAVENOUS

## 2012-01-10 MED ORDER — MORPHINE SULFATE 2 MG/ML IJ SOLN: 2.0000 mg | INTRAMUSCULAR | Status: DC | PRN

## 2012-01-10 MED ORDER — ONDANSETRON HCL 4 MG/2ML IJ SOLN: 4.0000 mg | Freq: Four times a day (QID) | INTRAMUSCULAR | Status: DC | PRN

## 2012-01-10 MED ORDER — FUROSEMIDE 20 MG PO TABS
20.0000 mg | ORAL_TABLET | Freq: Every day | ORAL | Status: DC
  Filled 2012-01-10 (×2): qty 1

## 2012-01-10 MED ORDER — SODIUM CHLORIDE 0.9 % IJ SOLN
3.0000 mL | Freq: Two times a day (BID) | INTRAMUSCULAR | Status: DC
  Administered 2012-01-11 (×2): 3 mL via INTRAVENOUS

## 2012-01-10 MED ORDER — NITROGLYCERIN IN D5W 200-5 MCG/ML-% IV SOLN
INTRAVENOUS | Status: AC
  Administered 2012-01-10: 5 ug/min via INTRAVENOUS
  Filled 2012-01-10: qty 250

## 2012-01-10 MED ORDER — NITROGLYCERIN IN D5W 200-5 MCG/ML-% IV SOLN
2.0000 ug/min | INTRAVENOUS | Status: DC
  Administered 2012-01-10: 40 ug/min via INTRAVENOUS
  Administered 2012-01-11: 15 ug/min via INTRAVENOUS
  Filled 2012-01-10: qty 250

## 2012-01-10 MED ORDER — CYCLOBENZAPRINE HCL 10 MG PO TABS: 5.0000 mg | ORAL_TABLET | Freq: Three times a day (TID) | ORAL | Status: DC | PRN

## 2012-01-10 MED ORDER — NITROGLYCERIN IN D5W 200-5 MCG/ML-% IV SOLN
2.0000 ug/min | Freq: Once | INTRAVENOUS | Status: AC
  Administered 2012-01-10: 5 ug/min via INTRAVENOUS

## 2012-01-10 MED ORDER — SODIUM CHLORIDE 0.9 % IJ SOLN: 3.0000 mL | INTRAMUSCULAR | Status: DC | PRN

## 2012-01-10 MED ORDER — ACETAMINOPHEN 325 MG PO TABS
650.0000 mg | ORAL_TABLET | ORAL | Status: DC | PRN
  Administered 2012-01-10 – 2012-01-11 (×2): 650 mg via ORAL
  Filled 2012-01-10 (×2): qty 2

## 2012-01-10 MED ORDER — ZOLPIDEM TARTRATE 5 MG PO TABS: 10.0000 mg | ORAL_TABLET | Freq: Every evening | ORAL | Status: DC | PRN

## 2012-01-10 MED ORDER — POTASSIUM CITRATE ER 10 MEQ (1080 MG) PO TBCR: 10.0000 meq | EXTENDED_RELEASE_TABLET | Freq: Two times a day (BID) | ORAL | Status: DC

## 2012-01-10 MED ORDER — PANTOPRAZOLE SODIUM 40 MG PO TBEC
40.0000 mg | DELAYED_RELEASE_TABLET | Freq: Every day | ORAL | Status: DC
  Administered 2012-01-11: 40 mg via ORAL
  Filled 2012-01-10: qty 1

## 2012-01-10 MED ORDER — HEPARIN SOD (PORCINE) IN D5W 100 UNIT/ML IV SOLN
1100.0000 [IU]/h | INTRAVENOUS | Status: DC
  Administered 2012-01-10: 900 [IU]/h via INTRAVENOUS
  Filled 2012-01-10 (×3): qty 250

## 2012-01-10 MED ORDER — ASPIRIN EC 81 MG PO TBEC
81.0000 mg | DELAYED_RELEASE_TABLET | Freq: Every day | ORAL | Status: DC
  Administered 2012-01-11: 81 mg via ORAL
  Filled 2012-01-10: qty 1

## 2012-01-10 MED ORDER — ATENOLOL 50 MG PO TABS
50.0000 mg | ORAL_TABLET | Freq: Two times a day (BID) | ORAL | Status: DC
  Administered 2012-01-10 – 2012-01-11 (×2): 50 mg via ORAL
  Filled 2012-01-10 (×3): qty 1

## 2012-01-10 MED ORDER — SODIUM CHLORIDE 0.9 % IV SOLN: 250.0000 mL | INTRAVENOUS | Status: DC | PRN

## 2012-01-10 MED ORDER — HEPARIN BOLUS VIA INFUSION
4000.0000 [IU] | Freq: Once | INTRAVENOUS | Status: AC
  Administered 2012-01-10: 4000 [IU] via INTRAVENOUS
  Filled 2012-01-10: qty 4000

## 2012-01-10 NOTE — ED Notes (Signed)
Nitroglycerin drip increased to 10 mcg/min

## 2012-01-10 NOTE — ED Notes (Signed)
Intermittent chest pain x 2 weeks, worse this am.  Has seen MD and is scheduled for stress test this week.  Some nausea, no vomiting.  Some SOB and diaphoresis.  No diaphoresis currently.  No acute resp distress currently.  Pain is 4-5 out of 10.  Pain radiates up to neck and to back.  Pt also c/o RUQ intermittent abdominal pain.  No diff with eating.  No constipation or diarrhea.  No known fever.

## 2012-01-10 NOTE — Progress Notes (Signed)
ANTICOAGULATION CONSULT NOTE - Initial Consult  Pharmacy Consult for heparin Indication: chest pain/ACS  No Known Allergies  Patient Measurements: Height: 5' 4"  (162.6 cm) Weight: 185 lb (83.915 kg) IBW/kg (Calculated) : 54.7   Vital Signs: Temp: 98.5 F (36.9 C) (01/20 1534) Temp src: Oral (01/20 1534) BP: 113/67 mmHg (01/20 1330) Pulse Rate: 89  (01/20 1330)  Labs:  Basename 01/10/12 0900  HGB 16.1*  HCT 45.8  PLT 243  APTT --  LABPROT --  INR --  HEPARINUNFRC --  CREATININE 0.80  CKTOTAL --  CKMB --  TROPONINI <0.30   Estimated Creatinine Clearance: 83.3 ml/min (by C-G formula based on Cr of 0.8).  Medical History: Past Medical History  Diagnosis Date  . HTN (hypertension)   . HLD (hyperlipidemia)   . Fatty liver     elevated LFTs in past - negative Bx  . NSVT (nonsustained ventricular tachycardia)     seen by Dr. Caryl Comes in 2011  . Nephrolithiasis   . Chest pain at rest     A.  GXT echo in 2/11: no ischemia, EF 55%    Medications:  Prescriptions prior to admission  Medication Sig Dispense Refill  . aspirin 81 MG tablet Take 81 mg by mouth daily.      Marland Kitchen atenolol (TENORMIN) 50 MG tablet Take 1 tablet (50 mg total) by mouth 2 (two) times daily.  180 tablet  4  . furosemide (LASIX) 20 MG tablet Take 20 mg by mouth daily.        Marland Kitchen lisinopril (PRINIVIL,ZESTRIL) 40 MG tablet Take 40 mg by mouth daily.        . potassium citrate (UROCIT-K) 10 MEQ (1080 MG) SR tablet Take 10 mEq by mouth 2 (two) times daily.          Assessment: 65 yof with prior history of chest pain admitted with recurrent chest discomfort. Starting IV heparin for likely [place] with plan for diagnostic cardiac cath in the AM.   Goal of Therapy:  Heparin level 0.3-0.7 units/ml   Plan:  1. Heparin bolus 4000units IV x 1 2. Heparin gtt 900units/hr 3. Check 6 hour heparin level 4. Daily heparin level and CBC  Anupama Piehl, Rande Lawman 01/10/2012,4:47 PM

## 2012-01-10 NOTE — H&P (Signed)
Patient ID: Dawn Moon MRN: [id number], DOB/AGE: 1956/09/15   Admit date: 01/10/2012   Primary Physician: Vidal Schwalbe, MD, MD Primary Cardiologist: B. Crenshaw  Pt. Profile:   56 y/o female w/ prior h/o chest pain that presents with recurrent chest discomfort this am.  Problem List: Past Medical History  Diagnosis Date  . HTN (hypertension)   . HLD (hyperlipidemia)   . Fatty liver     elevated LFTs in past - negative Bx  . NSVT (nonsustained ventricular tachycardia)     seen by Dr. Caryl Comes in 2011  . Nephrolithiasis   . Chest pain at rest     A.  GXT echo in 2/11: no ischemia, EF 55%    Past Surgical History  Procedure Date  . Tubal ligation 1985  . Colonoscopy   . Lithotripsy      Allergies: No Known Allergies  HPI:   56 y/o female with prior h/o c/p and palpitations prev eval with holter monitor in 2011 revealing NSVT.  She subsequently underwent a stress echo showing NL LV fxn and no evidence of ischemia.  She  Has been managed with atenolol with improvement in Ss.  Over the past 2 months however, she has noted increasing frequency of tachypalps/fluttering, occurring approx 2x/wk, lasting a few secs and resolving spont.  No assoc presyncope/syncope/dyspnea/chest pain.  Separately however, she has also been experiencing intermittent chest pressure, also occurring 2x/wk, but not assoc with the palpitations.  C/P is most likely to occur @ rest and is not assoc with sob/diaph/nausea.  C/P usually lasts about 30-60 secs and resolves spont.  Last weekend, she felt more fatigued than usual when she was out with her grandson. She saw Dr. Stanford Breed on 1/16 and given Ss, her atenolol was titrated and she was scheduled for an outpt nuc study.  Unfortunately, this am, shortly after awakening, she began to experience recurrent sscp w/o assoc Ss.  When c/p persisted longer than usual, she called her DTR (a nurse with our practice) and she presented to [place].  After arrival, she  was given 1 SL NTG with immediate relief.  Approx 30 mins later, c/p recurred and again was relieved after 1 SL NTG.  She was then placed on a NTG infusion and Ss have been intermittent since.  Her troponin was NL this am.  Additional sets are pending.  She was transferred to [facility] for further cardiac eval.  She currently reports 2/10 chest discomfort.   Home Medications  Medications Prior to Admission  Medication Sig Dispense Refill  . atenolol (TENORMIN) 50 MG tablet Take 1 tablet (50 mg total) by mouth 2 (two) times daily.  180 tablet  4  . furosemide (LASIX) 20 MG tablet Take 20 mg by mouth daily.        Marland Kitchen lisinopril (PRINIVIL,ZESTRIL) 40 MG tablet Take 40 mg by mouth daily.        . potassium citrate (UROCIT-K) 10 MEQ (1080 MG) SR tablet Take 10 mEq by mouth 2 (two) times daily.              ASA 21m daily   Family History  Problem Relation Age of Onset  . Coronary artery disease Father     CABG in his 654s died @ 72 . Lymphoma Mother     non-hodgkins - died @ 849 . Coronary artery disease Mother      History   Social History  . Marital Status: Widowed  Spouse Name: N/A    Number of Children: N/A  . Years of Education: N/A   Occupational History  . Not on file.   Social History Main Topics  . Smoking status: Never Smoker   . Smokeless tobacco: Not on file  . Alcohol Use: Yes     Rare  . Drug Use: No  . Sexually Active: Not on file   Other Topics Concern  . Not on file   Social History Narrative   Does not routinely exercise.  Has to walk up stairs to work and can do w/o Ss.  Lives @ home in [place] with family.     Review of Systems: General: negative for chills, fever, night sweats or weight changes.  Cardiovascular:+++ for chest pain and palpitations as outlined in the HPI.  She had some fatigue with activity last weekend but denies dyspnea on exertion, edema, orthopnea, palpitations, paroxysmal nocturnal dyspnea. Dermatological: negative for  rash Respiratory: negative for cough or wheezing Urologic: negative for hematuria Abdominal: negative for nausea, vomiting, diarrhea, bright red blood per rectum, melena, or hematemesis Neurologic: negative for visual changes, syncope, or dizziness All other systems reviewed and are otherwise negative except as noted above.  Physical Exam: Blood pressure 113/67, pulse 89, temperature 98.5 F (36.9 C), temperature source Oral, resp. rate 16, height 5' 4"  (1.626 m), weight 185 lb (83.915 kg), SpO2 98.00%.  General: Well developed, well nourished, in no acute distress. Head: Normocephalic, atraumatic, sclera non-icteric, no xanthomas, nares are without discharge.  Neck: Supple without bruits or JVD. Lungs:  Resp regular and unlabored, CTA. Heart: RRR no s3, s4, or murmurs. Abdomen: Soft, non-tender, non-distended, BS + x 4.  Msk:  Strength and tone appears normal for age. Extremities: No clubbing, cyanosis or edema. DP/PT/Radials 2+ and equal bilaterally. Neuro: Alert and oriented X 3. Moves all extremities spontaneously. Psych: Normal affect.   Labs:   Results for orders placed during the hospital encounter of 01/10/12 (from the past 72 hour(s))  CBC     Status: Abnormal   Collection Time   01/10/12  9:00 AM      Component Value Range Comment   WBC 16.1 (*) 4.0 - 10.5 (K/uL)    RBC 5.37 (*) 3.87 - 5.11 (MIL/uL)    Hemoglobin 16.1 (*) 12.0 - 15.0 (g/dL)    HCT 45.8  36.0 - 46.0 (%)    MCV 85.3  78.0 - 100.0 (fL)    MCH 30.0  26.0 - 34.0 (pg)    MCHC 35.2  30.0 - 36.0 (g/dL)    RDW 13.7  11.5 - 15.5 (%)    Platelets 243  150 - 400 (K/uL)   BASIC METABOLIC PANEL     Status: Abnormal   Collection Time   01/10/12  9:00 AM      Component Value Range Comment   Sodium 140  135 - 145 (mEq/L)    Potassium 3.6  3.5 - 5.1 (mEq/L)    Chloride 103  96 - 112 (mEq/L)    CO2 26  19 - 32 (mEq/L)    Glucose, Bld 128 (*) 70 - 99 (mg/dL)    BUN 9  6 - 23 (mg/dL)    Creatinine, Ser 0.80  0.50 -  1.10 (mg/dL)    Calcium 9.4  8.4 - 10.5 (mg/dL)    GFR calc non Af Amer 81 (*) >90 (mL/min)    GFR calc Af Amer >90  >90 (mL/min)   TROPONIN I  Status: Normal   Collection Time   01/10/12  9:00 AM      Component Value Range Comment   Troponin I <0.30  <0.30 (ng/mL)      Radiology/Studies: Chest Portable 1 View  01/10/2012  *RADIOLOGY REPORT*  Clinical Data: Chest pain  PORTABLE CHEST - 1 VIEW  Comparison: 04/07/2010  Findings: Lungs are clear. No pleural effusion or pneumothorax.  Cardiomediastinal silhouette is within normal limits.  IMPRESSION: No evidence of acute cardiopulmonary disease.  Original Report Authenticated By: Julian Hy, M.D.    EKG:  RSR, no acute changes.  ASSESSMENT AND PLAN:   1.  [place]:  Pt presents with a several month h/o intermittent, fleeting c/p and more persistent, nitrate responsive chest pain this am.  First Trop was negative.  Admit to stepdown.  Cycle CE.  Cont iv ntg and add heparin.  Cont asa, bb and plan diagnostic cath in the am.  2.  HTN:  Stable.  Cont home meds and follow closely on IV NTG.  3.  HL:  Check lipids.  H/o elevated LFT's - fatty liver.   Signed, Murray Hodgkins, NP 01/10/2012, 4:08 PM   History reviewed with the patient, no changes to be made. Chest pain today.  Substernal heaviness at rest.  Not like previous symptoms.  Diaphoresis, no radiation.  Mild nausea.  In [facility] ER no acute ST T wave changes.  First set of enzymes negative The patient exam reveals no distress, lungs clear, RRR, no murmur.  All available labs, radiology testing, previous records reviewed. Agree with documented assessment and plan.  Given the description of the symptoms and unstable nature cardiac catheterization is indicated.   Dawn Moon  4:23 PM 11/06/2011

## 2012-01-10 NOTE — ED Notes (Signed)
Nitroglycerin drip increased to 15 mcg/min

## 2012-01-10 NOTE — ED Notes (Signed)
Increased nitroglycerin drip to 20 mcg/min

## 2012-01-10 NOTE — ED Provider Notes (Signed)
History     CSN: [id number]  Arrival date & time 01/10/12  1324   First MD Initiated Contact with Patient 01/10/12 [phone]      Chief Complaint  Patient presents with  . Chest Pain  . Shortness of Breath  . Nausea    (Consider location/radiation/quality/duration/timing/severity/associated sxs/prior treatment) HPI Comments: Has been having chest pains off and on for the past 2 1/2 weeks.  This morning woke up and felt "bad".  She feels tight in the chest, fatigued, and has no energy.    Patient is a 56 y.o. female presenting with chest pain and shortness of breath. The history is provided by the patient.  Chest Pain Episode onset: 2 1/2 weeks ago. Chest pain occurs intermittently. The chest pain is worsening. At its most intense, the pain is at 5/10. The severity of the pain is moderate. The quality of the pain is described as tightness. The pain does not radiate. Primary symptoms include fatigue and shortness of breath. Pertinent negatives for primary symptoms include no fever. She tried nothing for the symptoms.    Shortness of Breath  Associated symptoms include chest pain and shortness of breath. Pertinent negatives include no fever.    Past Medical History  Diagnosis Date  . HTN (hypertension)     GXT echo in 2/11: no ischemia, EF 55%  . HLD (hyperlipidemia)   . Fatty liver     elevated LFTs in past  . NSVT (nonsustained ventricular tachycardia)     seen by Dr. Caryl Comes in past  . Atrial tachycardia, paroxysmal     seen by Dr. Caryl Comes in past  . Nephrolithiasis     Past Surgical History  Procedure Date  . Tubal ligation 1985  . Colonoscopy   . Lithotripsy     Family History  Problem Relation Age of Onset  . Coronary artery disease Father     CABG in his 59s    History  Substance Use Topics  . Smoking status: Never Smoker   . Smokeless tobacco: Not on file  . Alcohol Use: Yes     Rare    OB History    Grav Para Term Preterm Abortions TAB SAB Ect Mult Living                    Review of Systems  Constitutional: Positive for fatigue. Negative for fever.  Respiratory: Positive for shortness of breath.   Cardiovascular: Positive for chest pain.  All other systems reviewed and are negative.    Allergies  Review of patient's allergies indicates no known allergies.  Home Medications   Current Outpatient Rx  Name Route Sig Dispense Refill  . ASPIRIN 81 MG PO TABS Oral Take 81 mg by mouth daily.    . ATENOLOL 50 MG PO TABS Oral Take 1 tablet (50 mg total) by mouth 2 (two) times daily. 180 tablet 4  . FUROSEMIDE 20 MG PO TABS Oral Take 20 mg by mouth daily.      Marland Kitchen LISINOPRIL 40 MG PO TABS Oral Take 40 mg by mouth daily.      Marland Kitchen POTASSIUM CITRATE ER 10 MEQ (1080 MG) PO TBCR Oral Take 10 mEq by mouth 2 (two) times daily.        BP 161/89  Pulse 94  Temp(Src) 98.6 F (37 C) (Oral)  Resp 22  Ht 5' 4"  (1.626 m)  Wt 185 lb (83.915 kg)  BMI 31.76 kg/m2  SpO2 95%  Physical Exam  Nursing note and vitals reviewed. Constitutional: She is oriented to person, place, and time. She appears well-developed and well-nourished. No distress.  HENT:  Head: Normocephalic and atraumatic.  Neck: Normal range of motion. Neck supple.  Cardiovascular: Normal rate and regular rhythm.  Exam reveals no gallop and no friction rub.   No murmur heard. Pulmonary/Chest: Effort normal and breath sounds normal. No respiratory distress. She has no wheezes.  Abdominal: Soft. Bowel sounds are normal. She exhibits no distension. There is no tenderness.  Musculoskeletal: Normal range of motion.  Neurological: She is alert and oriented to person, place, and time.  Skin: Skin is warm and dry. She is not diaphoretic.    ED Course  Procedures (including critical care time)  Labs Reviewed  CBC - Abnormal; Notable for the following:    WBC 16.1 (*)    RBC 5.37 (*)    Hemoglobin 16.1 (*)    All other components within normal limits  BASIC METABOLIC PANEL  TROPONIN I    No results found.   No diagnosis found.   Date: 01/10/2012  Rate: 92  Rhythm: normal sinus rhythm  QRS Axis: normal  Intervals: normal  ST/T Wave abnormalities: normal  Conduction Disutrbances:none  Narrative Interpretation:   Old EKG Reviewed: none available    MDM  The patient has been experiencing angina-like symtpoms that have been coming and going with SL ntg.  The ekg and labs are okay.  I called and spoke with Dr. Lattie Haw who agrees to accept the patient in transfer.  Will start a ntg drip at his request.         Veryl Speak, MD 01/10/12 1121

## 2012-01-11 ENCOUNTER — Encounter (HOSPITAL_COMMUNITY)
Admission: EM | Disposition: A | Discharge status: Home / Self Care | Payer: Self-pay | Source: Home / Self Care | Attending: Cardiology

## 2012-01-11 ENCOUNTER — Other Ambulatory Visit: Payer: Self-pay

## 2012-01-11 DIAGNOSIS — I200 Unstable angina: Secondary | ICD-10-CM

## 2012-01-11 HISTORY — PX: LEFT HEART CATHETERIZATION WITH CORONARY ANGIOGRAM: SHX5451

## 2012-01-11 LAB — BASIC METABOLIC PANEL
Calcium: 8.7 mg/dL (ref 8.4–10.5)
GFR calc non Af Amer: >90 mL/min (ref >90)
Glucose, Bld: 173 mg/dL — ABNORMAL HIGH (ref 70–99)
Sodium: 140 mEq/L (ref 135–145)

## 2012-01-11 LAB — LIPID PANEL
Cholesterol: 168 mg/dL (ref 0–200)
Total CHOL/HDL Ratio: 3 RATIO

## 2012-01-11 LAB — HEMOGLOBIN A1C
Hgb A1c MFr Bld: 5.6 % (ref <5.7)
Mean Plasma Glucose: 114 mg/dL (ref <117)

## 2012-01-11 LAB — TSH: TSH: 0.503 u[IU]/mL (ref 0.350–4.500)

## 2012-01-11 LAB — CBC
MCH: 30.4 pg (ref 26.0–34.0)
Platelets: 201 10*3/uL (ref 150–400)
RBC: 4.37 MIL/uL (ref 3.87–5.11)

## 2012-01-11 LAB — CARDIAC PANEL(CRET KIN+CKTOT+MB+TROPI)
CK, MB: 1.2 ng/mL (ref 0.3–4.0)
Troponin I: <0.3 ng/mL (ref <0.30)

## 2012-01-11 LAB — HEPARIN LEVEL (UNFRACTIONATED): Heparin Unfractionated: 0.22 IU/mL — ABNORMAL LOW (ref 0.30–0.70)

## 2012-01-11 SURGERY — LEFT HEART CATHETERIZATION WITH CORONARY ANGIOGRAM
Anesthesia: LOCAL

## 2012-01-11 MED ORDER — ACETAMINOPHEN 325 MG PO TABS: 650.0000 mg | ORAL_TABLET | ORAL | Status: DC | PRN

## 2012-01-11 MED ORDER — NITROGLYCERIN 0.2 MG/ML ON CALL CATH LAB
INTRAVENOUS | Status: AC
  Filled 2012-01-11: qty 1

## 2012-01-11 MED ORDER — HEPARIN (PORCINE) IN NACL 2-0.9 UNIT/ML-% IJ SOLN
INTRAMUSCULAR | Status: AC
  Filled 2012-01-11: qty 2000

## 2012-01-11 MED ORDER — VERAPAMIL HCL 2.5 MG/ML IV SOLN
INTRAVENOUS | Status: AC
  Filled 2012-01-11: qty 2

## 2012-01-11 MED ORDER — INFLUENZA VIRUS VACC SPLIT PF IM SUSP: 0.5000 mL | INTRAMUSCULAR | Status: DC

## 2012-01-11 MED ORDER — FENTANYL CITRATE 0.05 MG/ML IJ SOLN
INTRAMUSCULAR | Status: AC
  Filled 2012-01-11: qty 2

## 2012-01-11 MED ORDER — MIDAZOLAM HCL 2 MG/2ML IJ SOLN
INTRAMUSCULAR | Status: AC
  Filled 2012-01-11: qty 2

## 2012-01-11 MED ORDER — SODIUM CHLORIDE 0.9 % IV SOLN: 1.0000 mL/kg/h | INTRAVENOUS | Status: DC

## 2012-01-11 MED ORDER — HEPARIN SODIUM (PORCINE) 1000 UNIT/ML IJ SOLN
INTRAMUSCULAR | Status: AC
  Filled 2012-01-11: qty 1

## 2012-01-11 MED ORDER — ONDANSETRON HCL 4 MG/2ML IJ SOLN: 4.0000 mg | Freq: Four times a day (QID) | INTRAMUSCULAR | Status: DC | PRN

## 2012-01-11 MED ORDER — LIDOCAINE HCL (PF) 1 % IJ SOLN
INTRAMUSCULAR | Status: AC
  Filled 2012-01-11: qty 30

## 2012-01-11 MED ORDER — POTASSIUM CHLORIDE CRYS ER 20 MEQ PO TBCR
20.0000 meq | EXTENDED_RELEASE_TABLET | Freq: Once | ORAL | Status: AC
  Administered 2012-01-11: 20 meq via ORAL

## 2012-01-11 MED ORDER — POTASSIUM CHLORIDE CRYS ER 20 MEQ PO TBCR
EXTENDED_RELEASE_TABLET | ORAL | Status: AC
  Administered 2012-01-11: 20 meq via ORAL
  Filled 2012-01-11: qty 1

## 2012-01-11 NOTE — Discharge Summary (Signed)
Discharge Summary   Patient ID: Dawn Moon MRN: [id number], DOB/AGE: 1956/09/15 56 y.o.  Primary MD: Vidal Schwalbe, MD, MD Primary Cardiologist: Kirk Ruths MD  Admit date: 01/10/2012 D/C date:     01/11/2012      Primary Discharge Diagnoses:  1. Chest pain, likely noncardiac  - negative cardiac enzymes x 3   - Cath without significant obstructive CAD on 01/11/12; Normal LV function EF 55-65%   - For outpatient echocardiogram as she has a family history of cardiac lymphoma 2. Hyperglycemia  - A1C 5.6%  - Instructed to follow up with PCP for monitoring 3. Leukocytosis  - Trended down, no obvious source of infection  - For CBC next week  Secondary Discharge Diagnoses:  1. Hypertension 2. Hyperlipidemia 3. NSVT - by holter monitor, seen by Dr. Caryl Comes in 2011  4. Fatty Liver - elevated LFTs in past - negative Bx  5. Nephrolithiasis  Allergies No Known Allergies  Diagnostic Studies/Procedures:   01/11/12 - Left Heart Cath, Selective Coronary Angiography, LV angiography Hemodynamics:  AO 121/80 with a mean of 100  LV 128/8  Coronary dominance: right  Left mainstem: The left main is widely patent with no obstructive disease.  Left anterior descending (LAD): The proximal LAD is of large caliber. There is no significant obstructive disease. The first diagonal is widely patent. There is no stenosis throughout the mid or distal LAD.  Left circumflex (LCx): The left circumflex is patent throughout. There is a tiny first OM and a large second OM branch. There is nonobstructive disease throughout the distribution of the left circumflex.  Right coronary artery (RCA): The right coronary artery is free of any significant angiographic disease. The vessel is dominant. It supplies a large acute marginal branch as well as a PDA branch. There is no obstructive disease throughout.  Left ventriculography: Left ventricular systolic function is normal, LVEF is estimated at 55-65%, there  is no significant mitral regurgitation  Final Conclusions:  1. Widely patent coronary arteries without significant obstructive CAD  2. Normal left ventricular function  Recommendations: Reassurance. Suspect noncardiac chest pain.  History of Present Illness: 56 y.o. female w/ PMHx significant for HTN, HLD, NSVT, and chest pain (GXT echo 2/11 no ischemia) who presented to [facility] on 01/10/12 with complaints of chest pain after being evaluated at [facility].  Over the past 2 months she noted increasing frequency of tachypalps/fluttering, occurring approx 2x/wk, lasting a few secs and resolving spont. No assoc presyncope/syncope/dyspnea/chest pain. Separately however, she has also been experiencing intermittent chest pressure, also occurring 2x/wk, but not assoc with the palpitations. C/P is most likely to occur @ rest and is not assoc with sob/diaph/nausea. C/P usually lasts about 30-60 secs and resolves spont. Last weekend, she felt more fatigued than usual when she was out with her grandson. She saw Dr. Stanford Breed on 1/16 and given Ss, her atenolol was titrated and she was scheduled for an outpt nuc study. Unfortunately, on the morning of presentation, shortly after awakening, she began to experience recurrent sscp w/o assoc Ss. When c/p persisted longer than usual, she called her DTR (a nurse with our practice) and she presented to [place].   Hospital Course: In the [facility] ED her cp was relieved with SL NTG x1 but had subsequent cp and was placed on a NTG infusion. EKG revealed NSR with no significant ST changes. CXR was without acute cardiopulmonary abnormalities. Initial cardiac enzyme was negative. Labs were significant for  WBC 16.1. She was admitted for further evaluation and treatment.  Cardiac enzymes were cycled and remained negative and she had no further chest pain. WBC trended down to 12.0 and she remained afebrile. Cardiac catheterization was performed on 01/11/12 which  revealed widely patent coronary arteries without significant obstructive CAD and normal left ventricular function. She tolerated the procedure without complication. Her chest pain was thought to be noncardiac.  She was seen and examined by Dr. Burt Knack and felt stable for discharge with plans for outpatient echocardiogram as she has a family history of cardiac lymphoma. She will also have a repeat CBC as well as CMET at that time.  Discharge Vitals: Blood pressure 154/87, pulse 75, temperature 98.5 F (36.9 C), temperature source Oral, resp. rate 16, height 5' 4"  (1.626 m), weight 184 lb 15.5 oz (83.9 kg), SpO2 97.00%.  Labs: Lab Results  Component Value Date   WBC 12.0* 01/11/2012   HGB 13.3 01/11/2012   HCT 38.0 01/11/2012   MCV 87.0 01/11/2012   PLT 201 01/11/2012    Lab 01/11/12 0051  NA 140  K 3.4*  CL 104  CO2 25  BUN 7  CREATININE 0.65  CALCIUM 8.7  GLUCOSE 173*   Basename 01/11/12 0050 01/10/12 1712 01/10/12 0900  CKTOTAL 48 52 --  CKMB 1.2 1.2 --  TROPONINI <0.30 <0.30 <0.30   Lab Results  Component Value Date   CHOL 168 01/11/2012   HDL 56 01/11/2012   LDLCALC 71 01/11/2012   TRIG 205* 01/11/2012     01/10/2012 17:11  Hemoglobin A1C 5.6     01/10/2012 17:11  TSH 0.503    Discharge Medications   Medication List  As of 01/11/2012  3:28 PM   TAKE these medications         atenolol 50 MG tablet   Commonly known as: TENORMIN   Take 50 mg by mouth 2 (two) times daily.      FISH OIL PO   Take 1 capsule by mouth See admin instructions. Takes 1 capsule when she remembers      furosemide 20 MG tablet   Commonly known as: LASIX   Take 20 mg by mouth daily.      lisinopril 40 MG tablet   Commonly known as: PRINIVIL,ZESTRIL   Take 40 mg by mouth daily.      multivitamins ther. w/minerals Tabs   Take 1 tablet by mouth daily.      potassium citrate 10 MEQ (1080 MG) SR tablet   Commonly known as: UROCIT-K   Take 10 mEq by mouth 2 (two) times daily.         ASK  your doctor about these medications         aspirin 81 MG tablet   Take 81 mg by mouth daily.            Disposition   Discharge Orders    Future Appointments: Provider: Department: Dept Phone: Center:   01/18/2012 9:10 AM [phone] [facility]   01/18/2012 9:30 AM [facility]  None   02/24/2012 8:00 AM [facility] Perla, [place] [phone] [facility]     Future Orders Please Complete By Expires   Diet - low sodium heart healthy      Increase activity slowly      Discharge instructions      Comments:   **PLEASE REMEMBER TO BRING ALL OF YOUR MEDICATIONS TO EACH OF  YOUR FOLLOW-UP OFFICE VISITS.  * KEEP WRIST CATHETERIZATION SITE CLEAN AND DRY. Call the office for any signs of bleeding, pus, swelling, increased pain, or any other concerns. * NO HEAVY LIFTING (>10lbs) OR SEXUAL ACTIVITY X 7 DAYS. * NO DRIVING X 2-3 DAYS. * NO SOAKING BATHS, HOT TUBS, POOLS, ETC., X 7 DAYS.      Follow-up Information    Follow up with Vidal Schwalbe, MD .          Outstanding Labs/Studies:  1. Outpatient Echocardiogram 2. Follow up CBC & CMET  Duration of Discharge Encounter: Greater than 30 minutes including physician and PA time.  Signed, Mabel Unrein PA-C 01/11/2012, 3:28 PM

## 2012-01-11 NOTE — Interval H&P Note (Signed)
History and Physical Interval Note:  01/11/2012 2:01 PM  Dawn Moon  has presented today for surgery, with the diagnosis of cp  The various methods of treatment have been discussed with the patient and family. After consideration of risks, benefits and other options for treatment, the patient has consented to  Procedure(s): [place] as a surgical intervention .  The patients' history has been reviewed, patient examined, no change in status, stable for surgery.  I have reviewed the patients' chart and labs.  Questions were answered to the patient's satisfaction.     Sherren Mocha

## 2012-01-11 NOTE — Procedures (Signed)
Cardiac Catheterization Procedure Note  Name: NAYELLI INGLIS MRN: [id number] DOB: 03/12/1956  Procedure: Left Heart Cath, Selective Coronary Angiography, LV angiography  Indication: 56 year old woman presenting with symptoms typical of unstable angina. She was referred directly for cardiac catheterization.   Procedural Details: The right wrist was prepped, draped, and anesthetized with 1% lidocaine. Using the modified Seldinger technique, a 5 French sheath was introduced into the right radial artery. 3 mg of verapamil was administered through the sheath, weight-based unfractionated heparin was administered intravenously. Standard Judkins catheters were used for selective coronary angiography and left ventriculography. Catheter exchanges were performed over an exchange length guidewire. There were no immediate procedural complications. A TR band was used for radial hemostasis at the completion of the procedure.  The patient was transferred to the post catheterization recovery area for further monitoring.  Procedural Findings: Hemodynamics: AO 121/80 with a mean of 100 LV 128/8  Coronary angiography: Coronary dominance: right  Left mainstem: The left main is widely patent with no obstructive disease.  Left anterior descending (LAD): The proximal LAD is of large caliber. There is no significant obstructive disease. The first diagonal is widely patent. There is no stenosis throughout the mid or distal LAD.  Left circumflex (LCx): The left circumflex is patent throughout. There is a tiny first OM and a large second OM branch. There is nonobstructive disease throughout the distribution of the left circumflex.  Right coronary artery (RCA): The right coronary artery is free of any significant angiographic disease. The vessel is dominant. It supplies a large acute marginal branch as well as a PDA branch. There is no obstructive disease throughout.  Left ventriculography: Left ventricular systolic  function is normal, LVEF is estimated at 55-65%, there is no significant mitral regurgitation   Final Conclusions:   1. Widely patent coronary arteries without significant obstructive CAD 2. Normal left ventricular function  Recommendations: Reassurance. Suspect noncardiac chest pain.  Sherren Mocha 01/11/2012, 2:35 PM

## 2012-01-11 NOTE — Progress Notes (Signed)
@   Subjective:  Denies CP or dyspnea   Objective:  Filed Vitals:   01/11/12 0339 01/11/12 0400 01/11/12 0500 01/11/12 0600  BP: 116/56 101/57 104/57 119/68  Pulse: 85 81 77 79  Temp: 98.3 F (36.8 C)     TempSrc: Oral     Resp: 16     Height:      Weight: 184 lb 15.5 oz (83.9 kg)     SpO2: 96% 94% 94% 97%    Intake/Output from previous day:  Intake/Output Summary (Last 24 hours) at 01/11/12 0650 Last data filed at 01/11/12 0600  Gross per 24 hour  Intake  827.2 ml  Output    200 ml  Net  627.2 ml    Physical Exam: Physical exam: Well-developed well-nourished in no acute distress.  Skin is warm and dry.  HEENT is normal.  Neck is supple. No thyromegaly.  Chest is clear to auscultation with normal expansion.  Cardiovascular exam is regular rate and rhythm.  Abdominal exam nontender or distended. No masses palpated. Extremities show no edema. neuro grossly intact    Lab Results: Basic Metabolic Panel:  Basename 01/11/12 0051 01/10/12 0900  NA 140 140  K 3.4* 3.6  CL 104 103  CO2 25 26  GLUCOSE 173* 128*  BUN 7 9  CREATININE 0.65 0.80  CALCIUM 8.7 9.4  MG -- --  PHOS -- --   CBC:  Basename 01/11/12 0051 01/10/12 0900  WBC 12.0* 16.1*  NEUTROABS -- --  HGB 13.3 16.1*  HCT 38.0 45.8  MCV 87.0 85.3  PLT 201 243   Cardiac Enzymes:  Basename 01/11/12 0050 01/10/12 1712 01/10/12 0900  CKTOTAL 48 52 --  CKMB 1.2 1.2 --  CKMBINDEX -- -- --  TROPONINI <0.30 <0.30 <0.30     Assessment/Plan:  1) Chest pain - no ST changes; enzymes negative; for cath today; risks and benefits discussed and patient agrees to proceed. Echo to R/O pericardial effusion (family history of cardiac lymphoma). Hold lasix prior to cath; note no RUQ tenderness 2) Hypertension - continue present meds 3) palpitations - H/O NSVT; continue atenolol (recently increased in office). 4) Elevated WBC - repeat in AM or as outpatient.   Kirk Ruths 01/11/2012, 6:50 AM

## 2012-01-11 NOTE — H&P (View-Only) (Signed)
@   Subjective:  Denies CP or dyspnea   Objective:  Filed Vitals:   01/11/12 0339 01/11/12 0400 01/11/12 0500 01/11/12 0600  BP: 116/56 101/57 104/57 119/68  Pulse: 85 81 77 79  Temp: 98.3 F (36.8 C)     TempSrc: Oral     Resp: 16     Height:      Weight: 184 lb 15.5 oz (83.9 kg)     SpO2: 96% 94% 94% 97%    Intake/Output from previous day:  Intake/Output Summary (Last 24 hours) at 01/11/12 0650 Last data filed at 01/11/12 0600  Gross per 24 hour  Intake  827.2 ml  Output    200 ml  Net  627.2 ml    Physical Exam: Physical exam: Well-developed well-nourished in no acute distress.  Skin is warm and dry.  HEENT is normal.  Neck is supple. No thyromegaly.  Chest is clear to auscultation with normal expansion.  Cardiovascular exam is regular rate and rhythm.  Abdominal exam nontender or distended. No masses palpated. Extremities show no edema. neuro grossly intact    Lab Results: Basic Metabolic Panel:  Basename 01/11/12 0051 01/10/12 0900  NA 140 140  K 3.4* 3.6  CL 104 103  CO2 25 26  GLUCOSE 173* 128*  BUN 7 9  CREATININE 0.65 0.80  CALCIUM 8.7 9.4  MG -- --  PHOS -- --   CBC:  Basename 01/11/12 0051 01/10/12 0900  WBC 12.0* 16.1*  NEUTROABS -- --  HGB 13.3 16.1*  HCT 38.0 45.8  MCV 87.0 85.3  PLT 201 243   Cardiac Enzymes:  Basename 01/11/12 0050 01/10/12 1712 01/10/12 0900  CKTOTAL 48 52 --  CKMB 1.2 1.2 --  CKMBINDEX -- -- --  TROPONINI <0.30 <0.30 <0.30     Assessment/Plan:  1) Chest pain - no ST changes; enzymes negative; for cath today; risks and benefits discussed and patient agrees to proceed. Echo to R/O pericardial effusion (family history of cardiac lymphoma). Hold lasix prior to cath; note no RUQ tenderness 2) Hypertension - continue present meds 3) palpitations - H/O NSVT; continue atenolol (recently increased in office). 4) Elevated WBC - repeat in AM or as outpatient.   Kirk Ruths 01/11/2012, 6:50 AM

## 2012-01-11 NOTE — Progress Notes (Addendum)
ANTICOAGULATION CONSULT NOTE - Follow Up Consult  Pharmacy Consult for heparin Indication: chest pain/ACS  No Known Allergies  Patient Measurements: Height: 5' 4"  (162.6 cm) Weight: 182 lb 1.6 oz (82.6 kg) IBW/kg (Calculated) : 54.7   Vital Signs: Temp: 98.1 F (36.7 C) (01/20 2330) Temp src: Oral (01/20 2330) BP: 106/45 mmHg (01/20 2330) Pulse Rate: 84  (01/20 2330)  Labs:  Basename 01/11/12 0051 01/11/12 0050 01/10/12 1712 01/10/12 0900  HGB 13.3 -- -- 16.1*  HCT 38.0 -- -- 45.8  PLT 201 -- -- 243  APTT -- -- -- --  LABPROT -- -- -- --  INR -- -- -- --  HEPARINUNFRC 0.22* -- -- --  CREATININE 0.65 -- -- 0.80  CKTOTAL -- 48 52 --  CKMB -- 1.2 1.2 --  TROPONINI -- <0.30 <0.30 <0.30   Estimated Creatinine Clearance: 82.7 ml/min (by C-G formula based on Cr of 0.65).   Medications:  Scheduled:    . acetaminophen  650 mg Oral Once  . aspirin  324 mg Oral Once  . aspirin EC  81 mg Oral Daily  . atenolol  50 mg Oral BID  . furosemide  20 mg Oral Daily  . heparin  4,000 Units Intravenous Once  . lisinopril  40 mg Oral Daily  . nitroGLYCERIN  2-200 mcg/min Intravenous Once  . pantoprazole  40 mg Oral Q0600  . potassium citrate  10 mEq Oral BID  . sodium chloride  3 mL Intravenous Q12H  . DISCONTD: aspirin  81 mg Oral Daily  . DISCONTD: potassium citrate  10 mEq Oral BID  . DISCONTD: sodium chloride  3 mL Intravenous Q12H   Infusions:    . sodium chloride    . heparin 900 Units/hr (01/10/12 2024)  . nitroGLYCERIN 45 mcg/min (01/10/12 2000)  . DISCONTD: nitroGLYCERIN      Assessment: 56yo female subtherapeutic on heparin with initial dosing for CP.  Goal of Therapy:  Heparin level 0.3-0.7 units/ml   Plan:  Will increase heparin gtt by ~2 units/kg/hr to 1100 units/hr and check level in 6hr.  Rogue Bussing PharmD BCPS 01/11/2012,2:24 AM   Note: Learned that heparin was started late and level drawn just ~4.5hr after gtt started; would have  expected level to be higher given proximity to bolus.  VB

## 2012-01-12 ENCOUNTER — Other Ambulatory Visit (HOSPITAL_COMMUNITY): Payer: 59

## 2012-01-12 ENCOUNTER — Encounter (HOSPITAL_COMMUNITY): Payer: 59 | Admitting: Radiology

## 2012-01-12 ENCOUNTER — Other Ambulatory Visit: Payer: Self-pay | Admitting: Family Medicine

## 2012-01-12 ENCOUNTER — Other Ambulatory Visit (HOSPITAL_COMMUNITY): Payer: Self-pay | Admitting: Cardiology

## 2012-01-12 DIAGNOSIS — Z1231 Encounter for screening mammogram for malignant neoplasm of breast: Secondary | ICD-10-CM

## 2012-01-12 NOTE — Discharge Summary (Signed)
Patient seen, examined. Available data reviewed. Agree with findings, assessment, and plan as outlined by [facility], PA-C. See cath note this same date.  Sherren Mocha, M.D.

## 2012-01-18 ENCOUNTER — Ambulatory Visit
Admission: RE | Admit: 2012-01-18 | Discharge: 2012-01-18 | Disposition: A | Discharge status: Home / Self Care | Payer: 59 | Source: Ambulatory Visit | Attending: Family Medicine | Admitting: Family Medicine

## 2012-01-18 ENCOUNTER — Ambulatory Visit (INDEPENDENT_AMBULATORY_CARE_PROVIDER_SITE_OTHER): Payer: 59 | Admitting: *Deleted

## 2012-01-18 ENCOUNTER — Ambulatory Visit (HOSPITAL_COMMUNITY): Payer: 59 | Attending: Cardiology | Admitting: Radiology

## 2012-01-18 DIAGNOSIS — I10 Essential (primary) hypertension: Secondary | ICD-10-CM | POA: Insufficient documentation

## 2012-01-18 DIAGNOSIS — R079 Chest pain, unspecified: Secondary | ICD-10-CM | POA: Insufficient documentation

## 2012-01-18 DIAGNOSIS — R42 Dizziness and giddiness: Secondary | ICD-10-CM

## 2012-01-18 DIAGNOSIS — E785 Hyperlipidemia, unspecified: Secondary | ICD-10-CM | POA: Insufficient documentation

## 2012-01-18 DIAGNOSIS — R002 Palpitations: Secondary | ICD-10-CM | POA: Insufficient documentation

## 2012-01-18 DIAGNOSIS — R0989 Other specified symptoms and signs involving the circulatory and respiratory systems: Secondary | ICD-10-CM

## 2012-01-18 DIAGNOSIS — R072 Precordial pain: Secondary | ICD-10-CM

## 2012-01-18 DIAGNOSIS — R0609 Other forms of dyspnea: Secondary | ICD-10-CM

## 2012-01-18 DIAGNOSIS — Z1231 Encounter for screening mammogram for malignant neoplasm of breast: Secondary | ICD-10-CM

## 2012-01-18 HISTORY — PX: TRANSTHORACIC ECHOCARDIOGRAM: SHX275

## 2012-01-18 LAB — CBC WITH DIFFERENTIAL/PLATELET
Basophils Relative: 0.8 % (ref 0.0–3.0)
Eosinophils Absolute: 0.2 10*3/uL (ref 0.0–0.7)
HCT: 45.8 % (ref 36.0–46.0)
Hemoglobin: 15.5 g/dL — ABNORMAL HIGH (ref 12.0–15.0)
Lymphocytes Relative: 27.8 % (ref 12.0–46.0)
MCHC: 33.8 g/dL (ref 30.0–36.0)
Neutro Abs: 5 10*3/uL (ref 1.4–7.7)
RBC: 5.04 Mil/uL (ref 3.87–5.11)

## 2012-01-18 LAB — BASIC METABOLIC PANEL
BUN: 10 mg/dL (ref 6–23)
CO2: 29 mEq/L (ref 19–32)
Chloride: 104 mEq/L (ref 96–112)
Creatinine, Ser: 0.8 mg/dL (ref 0.4–1.2)

## 2012-01-19 ENCOUNTER — Ambulatory Visit: Payer: 59 | Admitting: Cardiology

## 2012-01-21 ENCOUNTER — Other Ambulatory Visit: Payer: Self-pay | Admitting: Family Medicine

## 2012-01-21 DIAGNOSIS — R928 Other abnormal and inconclusive findings on diagnostic imaging of breast: Secondary | ICD-10-CM

## 2012-01-29 ENCOUNTER — Ambulatory Visit
Admission: RE | Admit: 2012-01-29 | Discharge: 2012-01-29 | Disposition: A | Discharge status: Home / Self Care | Payer: 59 | Source: Ambulatory Visit | Attending: Family Medicine | Admitting: Family Medicine

## 2012-01-29 DIAGNOSIS — R928 Other abnormal and inconclusive findings on diagnostic imaging of breast: Secondary | ICD-10-CM

## 2012-02-24 ENCOUNTER — Ambulatory Visit: Payer: 59 | Admitting: Cardiology

## 2012-05-27 ENCOUNTER — Other Ambulatory Visit: Payer: Self-pay | Admitting: Family Medicine

## 2012-05-27 DIAGNOSIS — N6009 Solitary cyst of unspecified breast: Secondary | ICD-10-CM

## 2012-06-20 ENCOUNTER — Ambulatory Visit
Admission: RE | Admit: 2012-06-20 | Discharge: 2012-06-20 | Disposition: A | Discharge status: Home / Self Care | Payer: 59 | Source: Ambulatory Visit | Attending: Family Medicine | Admitting: Family Medicine

## 2012-06-20 DIAGNOSIS — N6009 Solitary cyst of unspecified breast: Secondary | ICD-10-CM

## 2012-08-24 ENCOUNTER — Other Ambulatory Visit: Payer: Self-pay

## 2012-11-24 ENCOUNTER — Other Ambulatory Visit: Payer: Self-pay | Admitting: Family Medicine

## 2012-11-24 DIAGNOSIS — N6009 Solitary cyst of unspecified breast: Secondary | ICD-10-CM

## 2013-01-18 ENCOUNTER — Other Ambulatory Visit: Payer: 59

## 2013-02-02 ENCOUNTER — Other Ambulatory Visit: Payer: 59

## 2013-02-10 ENCOUNTER — Ambulatory Visit
Admission: RE | Admit: 2013-02-10 | Discharge: 2013-02-10 | Disposition: A | Discharge status: Home / Self Care | Payer: 59 | Source: Ambulatory Visit | Attending: Family Medicine | Admitting: Family Medicine

## 2013-03-31 ENCOUNTER — Encounter: Payer: Self-pay | Admitting: Cardiology

## 2013-03-31 ENCOUNTER — Ambulatory Visit (INDEPENDENT_AMBULATORY_CARE_PROVIDER_SITE_OTHER): Payer: 59 | Admitting: Cardiology

## 2013-03-31 VITALS — BP 132/89 | HR 67 | Wt 200.0 lb

## 2013-03-31 DIAGNOSIS — R002 Palpitations: Secondary | ICD-10-CM

## 2013-03-31 DIAGNOSIS — I10 Essential (primary) hypertension: Secondary | ICD-10-CM

## 2013-03-31 DIAGNOSIS — I4729 Other ventricular tachycardia: Secondary | ICD-10-CM

## 2013-03-31 DIAGNOSIS — I472 Ventricular tachycardia: Secondary | ICD-10-CM

## 2013-03-31 DIAGNOSIS — E785 Hyperlipidemia, unspecified: Secondary | ICD-10-CM

## 2013-03-31 DIAGNOSIS — R079 Chest pain, unspecified: Secondary | ICD-10-CM

## 2013-03-31 MED ORDER — AMLODIPINE BESYLATE 2.5 MG PO TABS: 2.5000 mg | ORAL_TABLET | Freq: Every day | ORAL | Status: DC

## 2013-03-31 NOTE — Assessment & Plan Note (Signed)
Continue atenolol.

## 2013-03-31 NOTE — Assessment & Plan Note (Signed)
Previous catheterization showed no coronary disease. Electrocardiogram normal. Etiology unclear. She takes Norvasc 2.5 mg daily as needed in addition to her other medications as she feels there may be a component of vasospasm.

## 2013-03-31 NOTE — Patient Instructions (Addendum)
Your physician wants you to follow-up in: ONE YEAR WITH DR CRENSHAW You will receive a reminder letter in the mail two months in advance. If you don't receive a letter, please call our office to schedule the follow-up appointment.  

## 2013-03-31 NOTE — Assessment & Plan Note (Signed)
Management per primary care. 

## 2013-03-31 NOTE — Progress Notes (Signed)
HPI: Pleasant female I initially saw in Feb of 2011 for palpitations. Stress echocardiogram in March of 2011 was normal. TSH and potassium normal. CardioNet showed 4 beats of nonsustained ventricular tachycardia; also brief atrial tachycardia; note some of symptoms also noted to be sinus tachycardia. Patient was seen by Dr. Caryl Comes and started on verapamil. Her symptoms did not improve and she was changed to a beta blocker. Her symptoms improved with atenolol. Note previous sleep study negative. Admitted in January of 2013 with chest pain. Cardiac catheterization at that time revealed no obstructive coronary artery disease. Her ejection fraction was 55-65%. Echocardiogram in January of 2013 showed normal LV function, mild left ventricular hypertrophy, and trace tricuspid regurgitation. She was last seen in Jan 2013. Since then, there is no dyspnea on exertion, orthopnea, PND, pedal edema, syncope or exertional chest pain. She occasionally feels diffuse chest pain with stress. She also notes occasional brief palpitations and has chest pain during those times.   Current Outpatient Prescriptions  Medication Sig Dispense Refill  . amLODipine (NORVASC) 2.5 MG tablet Take 1 tablet by mouth daily.      Marland Kitchen atenolol (TENORMIN) 50 MG tablet Take 50 mg by mouth 2 (two) times daily.      . citalopram (CELEXA) 20 MG tablet Take 1 tablet by mouth daily.      . clonazePAM (KLONOPIN) 0.5 MG tablet Take 1 tablet by mouth 2 (two) times daily. prn      . furosemide (LASIX) 20 MG tablet Take 20 mg by mouth daily.        Marland Kitchen lisinopril (PRINIVIL,ZESTRIL) 40 MG tablet Take 40 mg by mouth daily.        Marland Kitchen omeprazole (PRILOSEC) 10 MG capsule Take 10 mg by mouth daily.      . potassium citrate (UROCIT-K) 10 MEQ (1080 MG) SR tablet Take 10 mEq by mouth 2 (two) times daily.         No current facility-administered medications for this visit.     Past Medical History  Diagnosis Date  . HTN (hypertension)   . HLD  (hyperlipidemia)   . Fatty liver     elevated LFTs in past - negative Bx  . NSVT (nonsustained ventricular tachycardia)     seen by Dr. Caryl Comes in 2011  . Nephrolithiasis   . Chest pain at rest     A.  GXT echo in 2/11: no ischemia, EF 55%    Past Surgical History  Procedure Laterality Date  . Tubal ligation  1985  . Colonoscopy    . Lithotripsy      History   Social History  . Marital Status: Widowed    Spouse Name: N/A    Number of Children: N/A  . Years of Education: N/A   Occupational History  . Not on file.   Social History Main Topics  . Smoking status: Never Smoker   . Smokeless tobacco: Not on file  . Alcohol Use: Yes     Comment: Rare  . Drug Use: No  . Sexually Active: Not on file   Other Topics Concern  . Not on file   Social History Narrative   Does not routinely exercise.  Has to walk up stairs to work and can do w/o Ss.  Lives @ home in [place] with family.    ROS: no fevers or chills, productive cough, hemoptysis, dysphasia, odynophagia, melena, hematochezia, dysuria, hematuria, rash, seizure activity, orthopnea, PND, pedal edema, claudication. Remaining systems are negative.  Physical  Exam: Well-developed well-nourished in no acute distress.  Skin is warm and dry.  HEENT is normal.  Neck is supple.  Chest is clear to auscultation with normal expansion.  Cardiovascular exam is regular rate and rhythm.  Abdominal exam nontender or distended. No masses palpated. Extremities show no edema. neuro grossly intact  ECG sinus rhythm at a rate of 67. No ST changes.

## 2013-03-31 NOTE — Assessment & Plan Note (Signed)
Continue present blood pressure medications. 

## 2013-09-14 ENCOUNTER — Other Ambulatory Visit: Payer: Self-pay | Admitting: Gastroenterology

## 2013-09-14 DIAGNOSIS — K7689 Other specified diseases of liver: Secondary | ICD-10-CM

## 2013-09-14 DIAGNOSIS — K824 Cholesterolosis of gallbladder: Secondary | ICD-10-CM

## 2013-09-25 ENCOUNTER — Ambulatory Visit
Admission: RE | Admit: 2013-09-25 | Discharge: 2013-09-25 | Disposition: A | Discharge status: Home / Self Care | Payer: 59 | Source: Ambulatory Visit | Attending: Gastroenterology | Admitting: Gastroenterology

## 2013-09-25 DIAGNOSIS — K7689 Other specified diseases of liver: Secondary | ICD-10-CM

## 2013-09-25 DIAGNOSIS — K824 Cholesterolosis of gallbladder: Secondary | ICD-10-CM

## 2014-01-01 ENCOUNTER — Encounter: Payer: Self-pay | Admitting: *Deleted

## 2014-01-01 ENCOUNTER — Ambulatory Visit (HOSPITAL_COMMUNITY)
Admission: AD | Admit: 2014-01-01 | Discharge: 2014-01-01 | Disposition: A | Discharge status: Home / Self Care | Payer: 59 | Source: Ambulatory Visit | Attending: Cardiovascular Disease | Admitting: Cardiovascular Disease

## 2014-01-01 ENCOUNTER — Encounter: Payer: Self-pay | Admitting: Internal Medicine

## 2014-01-01 ENCOUNTER — Ambulatory Visit (HOSPITAL_COMMUNITY): Payer: 59

## 2014-01-01 ENCOUNTER — Encounter (HOSPITAL_COMMUNITY): Payer: Self-pay | Admitting: *Deleted

## 2014-01-01 ENCOUNTER — Encounter (HOSPITAL_COMMUNITY)
Admission: AD | Disposition: A | Discharge status: Home / Self Care | Payer: Self-pay | Source: Ambulatory Visit | Attending: Cardiovascular Disease

## 2014-01-01 ENCOUNTER — Ambulatory Visit (INDEPENDENT_AMBULATORY_CARE_PROVIDER_SITE_OTHER): Payer: 59 | Admitting: Internal Medicine

## 2014-01-01 VITALS — BP 149/94 | HR 70 | Ht 64.0 in | Wt 204.6 lb

## 2014-01-01 DIAGNOSIS — R079 Chest pain, unspecified: Secondary | ICD-10-CM

## 2014-01-01 DIAGNOSIS — Z01812 Encounter for preprocedural laboratory examination: Secondary | ICD-10-CM

## 2014-01-01 HISTORY — PX: CT CTA CORONARY W/CA SCORE W/CM &/OR WO/CM: HXRAD787

## 2014-01-01 LAB — BASIC METABOLIC PANEL
BUN: 10 mg/dL (ref 6–23)
CO2: 27 mEq/L (ref 19–32)
Calcium: 9 mg/dL (ref 8.4–10.5)
Chloride: 102 mEq/L (ref 96–112)
Creatinine, Ser: 0.64 mg/dL (ref 0.50–1.10)
Glucose, Bld: 94 mg/dL (ref 70–99)
Potassium: 3.9 mEq/L (ref 3.7–5.3)
Sodium: 143 mEq/L (ref 137–147)

## 2014-01-01 LAB — CBC
HEMATOCRIT: 45.7 % (ref 36.0–46.0)
Hemoglobin: 16.2 g/dL — ABNORMAL HIGH (ref 12.0–15.0)
MCH: 31.5 pg (ref 26.0–34.0)
MCHC: 35.4 g/dL (ref 30.0–36.0)
MCV: 88.7 fL (ref 78.0–100.0)
PLATELETS: 266 10*3/uL (ref 150–400)
RBC: 5.15 MIL/uL — ABNORMAL HIGH (ref 3.87–5.11)
RDW: 14.5 % (ref 11.5–15.5)
WBC: 10.6 10*3/uL — AB (ref 4.0–10.5)

## 2014-01-01 LAB — PROTIME-INR
INR: 0.96 (ref 0.00–1.49)
Prothrombin Time: 12.6 seconds (ref 11.6–15.2)

## 2014-01-01 LAB — TROPONIN I: Troponin I: <0.3 ng/mL (ref <0.30)

## 2014-01-01 SURGERY — LEFT HEART CATHETERIZATION WITH CORONARY ANGIOGRAM
Anesthesia: LOCAL

## 2014-01-01 MED ORDER — METOPROLOL TARTRATE 1 MG/ML IV SOLN
INTRAVENOUS | Status: AC
  Administered 2014-01-01: 5 mg
  Filled 2014-01-01: qty 5

## 2014-01-01 MED ORDER — DIAZEPAM 5 MG PO TABS: 5.0000 mg | ORAL_TABLET | ORAL | Status: DC

## 2014-01-01 MED ORDER — ASPIRIN 81 MG PO CHEW: 81.0000 mg | CHEWABLE_TABLET | ORAL | Status: DC

## 2014-01-01 MED ORDER — METOPROLOL TARTRATE 1 MG/ML IV SOLN
INTRAVENOUS | Status: AC
  Filled 2014-01-01: qty 5

## 2014-01-01 MED ORDER — SODIUM CHLORIDE 0.9 % IV SOLN: 1.0000 mL/kg/h | INTRAVENOUS | Status: DC

## 2014-01-01 MED ORDER — SODIUM CHLORIDE 0.9 % IV SOLN
INTRAVENOUS | Status: DC
Start: 2014-01-01 — End: 2014-01-03
  Administered 2014-01-01: 100 mL/h via INTRAVENOUS

## 2014-01-01 MED ORDER — NITROGLYCERIN 0.4 MG SL SUBL
SUBLINGUAL_TABLET | SUBLINGUAL | Status: AC
  Filled 2014-01-01: qty 25

## 2014-01-01 MED ORDER — IOHEXOL 350 MG/ML SOLN
100.0000 mL | Freq: Once | INTRAVENOUS | Status: AC | PRN
  Administered 2014-01-01: 80 mL via INTRAVENOUS

## 2014-01-01 NOTE — Assessment & Plan Note (Signed)
Seen 2 years ago  with chest pain with typical and atypical features. Cath was not revealing of significant coronary disease.  She comes back in now with symptoms not the similar; she was also diaphoretic. I think given her age and risk factors, in patient monitoring is appropriate. I discussed this with Dr. London Sheer. Catheterization versus nuclear testing are both reasonable to pursue. We'll make a decision based on laboratory testing that I would prefer catheterization given the importance of her symptoms, the lack of systemic other symptoms and her physical signs on examination

## 2014-01-01 NOTE — Patient Instructions (Signed)
Your physician recommends that you return for lab work today: BMET, CBCD  Your physician has requested that you have a cardiac catheterization. Cardiac catheterization is used to diagnose and/or treat various heart conditions. Doctors may recommend this procedure for a number of different reasons. The most common reason is to evaluate chest pain. Chest pain can be a symptom of coronary artery disease (CAD), and cardiac catheterization can show whether plaque is narrowing or blocking your heart's arteries. This procedure is also used to evaluate the valves, as well as measure the blood flow and oxygen levels in different parts of your heart. For further information please visit [email or web address]. Please follow instruction sheet, as given.  Today 01/01/2014 with Dr. Burt Knack

## 2014-01-01 NOTE — Progress Notes (Signed)
      Patient Care Team: Vidal Schwalbe, MD as PCP - General (Family Medicine) Vidal Schwalbe, MD (Family Medicine)   HPI  Dawn Moon is a 58 y.o. female With a history of recalls chest pain. Stress echo March 2001 with. January 2013 catheterization demonstrated no obstruction to left ventricular function. Echo at that time showed mild left ventricular hypertrophy. She was seen last 4/14 by Dr.BC at that time there is no complaints of chest pain or shortness of breath  She comes in today with a 48 hour history of recurrent exertional chest discomfort with radiation into her jaw associated with diaphoresis and dyspnea. There has been no edema. There've been no fever or chills. She does have a history of GE reflux with symptoms are quite distinct from this.  Notably the symptoms are not dissimilar from what she had 2 years ago that prompted her catheterization. ECG today is unrevealing.  She also has noxious nonproductive cough     Past Medical History  Diagnosis Date  . HTN (hypertension)   . HLD (hyperlipidemia)   . Fatty liver     elevated LFTs in past - negative Bx  . NSVT (nonsustained ventricular tachycardia)     seen by Dr. Caryl Comes in 2011  . Nephrolithiasis   . Chest pain at rest     A.  GXT echo in 2/11: no ischemia, EF 55%    Past Surgical History  Procedure Laterality Date  . Tubal ligation  1985  . Colonoscopy    . Lithotripsy      Current Outpatient Prescriptions  Medication Sig Dispense Refill  . amLODipine (NORVASC) 2.5 MG tablet Take 1 tablet (2.5 mg total) by mouth daily.  90 tablet  4  . atenolol (TENORMIN) 50 MG tablet Take 50 mg by mouth 2 (two) times daily.      . furosemide (LASIX) 20 MG tablet Take 20 mg by mouth daily.        Marland Kitchen lisinopril (PRINIVIL,ZESTRIL) 40 MG tablet Take 40 mg by mouth daily.        Marland Kitchen omeprazole (PRILOSEC) 10 MG capsule Take 10 mg by mouth daily.      . potassium citrate (UROCIT-K) 10 MEQ (1080 MG) SR tablet Take 10 mEq  by mouth 2 (two) times daily.         No current facility-administered medications for this visit.    No Known Allergies  Review of Systems negative except from HPI and PMH  Physical Exam BP 149/94  Pulse 70  Ht 5' 4"  (1.626 m)  Wt 204 lb 9.6 oz (92.806 kg)  BMI 35.10 kg/m2 Well developed and nourished diaphoretic in no acute distress HENT normal Neck supple with JVP-flat Clear Regular rate and rhythm, no murmurs or gallops Abd-soft with active BS No Clubbing cyanosis edema Skin-warm and dry A & Oriented  Grossly normal sensory and motor function  ECG demonstrates sinus rhythm at 70 intervals 13/08/40 T-wave inversion V1 and V2 which is similar to   4/14 nodes T wave inversion in V2 is normal there is nonspecific ST changes in lead 3 that are unchanged Assessment and  Plan

## 2014-01-21 HISTORY — PX: ESOPHAGOGASTRODUODENOSCOPY: SHX1529

## 2014-01-22 ENCOUNTER — Other Ambulatory Visit: Payer: Self-pay | Admitting: Gastroenterology

## 2014-06-13 ENCOUNTER — Other Ambulatory Visit: Payer: Self-pay

## 2014-06-13 DIAGNOSIS — Z1231 Encounter for screening mammogram for malignant neoplasm of breast: Secondary | ICD-10-CM

## 2014-06-20 ENCOUNTER — Ambulatory Visit
Admission: RE | Admit: 2014-06-20 | Discharge: 2014-06-20 | Disposition: A | Discharge status: Home / Self Care | Payer: 59 | Source: Ambulatory Visit

## 2014-06-20 DIAGNOSIS — Z1231 Encounter for screening mammogram for malignant neoplasm of breast: Secondary | ICD-10-CM

## 2014-10-22 ENCOUNTER — Other Ambulatory Visit (HOSPITAL_COMMUNITY)
Admission: RE | Admit: 2014-10-22 | Discharge: 2014-10-22 | Disposition: A | Discharge status: Home / Self Care | Payer: 59 | Source: Ambulatory Visit | Attending: Family Medicine | Admitting: Family Medicine

## 2014-10-22 ENCOUNTER — Other Ambulatory Visit: Payer: Self-pay | Admitting: Family Medicine

## 2014-10-22 DIAGNOSIS — Z124 Encounter for screening for malignant neoplasm of cervix: Secondary | ICD-10-CM | POA: Insufficient documentation

## 2014-10-22 DIAGNOSIS — Z1151 Encounter for screening for human papillomavirus (HPV): Secondary | ICD-10-CM | POA: Diagnosis present

## 2014-10-24 LAB — CYTOLOGY - PAP

## 2014-11-29 ENCOUNTER — Encounter (HOSPITAL_COMMUNITY): Payer: Self-pay | Admitting: Cardiovascular Disease

## 2015-04-18 ENCOUNTER — Ambulatory Visit
Admission: RE | Admit: 2015-04-18 | Discharge: 2015-04-18 | Disposition: A | Discharge status: Home / Self Care | Payer: 59 | Source: Ambulatory Visit | Attending: Family Medicine | Admitting: Family Medicine

## 2015-04-18 ENCOUNTER — Other Ambulatory Visit: Payer: Self-pay | Admitting: Family Medicine

## 2015-04-18 DIAGNOSIS — S8002XA Contusion of left knee, initial encounter: Secondary | ICD-10-CM

## 2015-11-12 ENCOUNTER — Other Ambulatory Visit (HOSPITAL_BASED_OUTPATIENT_CLINIC_OR_DEPARTMENT_OTHER): Payer: Self-pay | Admitting: Emergency Medicine

## 2015-11-12 ENCOUNTER — Emergency Department (HOSPITAL_BASED_OUTPATIENT_CLINIC_OR_DEPARTMENT_OTHER)
Admission: EM | Admit: 2015-11-12 | Discharge: 2015-11-12 | Disposition: A | Discharge status: Home / Self Care | Payer: 59 | Attending: Emergency Medicine | Admitting: Emergency Medicine

## 2015-11-12 ENCOUNTER — Encounter (HOSPITAL_BASED_OUTPATIENT_CLINIC_OR_DEPARTMENT_OTHER): Payer: Self-pay | Admitting: Emergency Medicine

## 2015-11-12 DIAGNOSIS — Z791 Long term (current) use of non-steroidal anti-inflammatories (NSAID): Secondary | ICD-10-CM | POA: Diagnosis not present

## 2015-11-12 DIAGNOSIS — Z8719 Personal history of other diseases of the digestive system: Secondary | ICD-10-CM | POA: Diagnosis not present

## 2015-11-12 DIAGNOSIS — I82401 Acute embolism and thrombosis of unspecified deep veins of right lower extremity: Secondary | ICD-10-CM

## 2015-11-12 DIAGNOSIS — Z79899 Other long term (current) drug therapy: Secondary | ICD-10-CM | POA: Diagnosis not present

## 2015-11-12 DIAGNOSIS — M7989 Other specified soft tissue disorders: Secondary | ICD-10-CM | POA: Diagnosis present

## 2015-11-12 DIAGNOSIS — I10 Essential (primary) hypertension: Secondary | ICD-10-CM | POA: Diagnosis not present

## 2015-11-12 DIAGNOSIS — Z8639 Personal history of other endocrine, nutritional and metabolic disease: Secondary | ICD-10-CM | POA: Insufficient documentation

## 2015-11-12 DIAGNOSIS — Z87442 Personal history of urinary calculi: Secondary | ICD-10-CM | POA: Diagnosis not present

## 2015-11-12 DIAGNOSIS — Z9889 Other specified postprocedural states: Secondary | ICD-10-CM | POA: Diagnosis not present

## 2015-11-12 LAB — D-DIMER, QUANTITATIVE (NOT AT ARMC): D DIMER QUANT: 0.52 ug{FEU}/mL — AB (ref 0.00–0.50)

## 2015-11-12 MED ORDER — ENOXAPARIN SODIUM 100 MG/ML ~~LOC~~ SOLN
1.0000 mg/kg | Freq: Once | SUBCUTANEOUS | Status: AC
  Administered 2015-11-12: 95 mg via SUBCUTANEOUS
  Filled 2015-11-12: qty 1

## 2015-11-12 NOTE — ED Provider Notes (Signed)
CSN: [id number]     Arrival date & time 11/12/15  1740 History  By signing my name below, I, Arianna Nassar, attest that this documentation has been prepared under the direction and in the presence of Quintella Reichert, MD. Electronically Signed: Julien Nordmann, ED Scribe. 11/12/2015. 6:55 PM.    Chief Complaint  Patient presents with  . Leg Swelling    since Tuesday     The history is provided by the patient. No language interpreter was used.   HPI Comments: Dawn Moon is a 59 y.o. female who has a PMHx of HTN presents to the Emergency Department complaining of constant, moderate, gradual worsening right leg swelling onset one week ago. Pt states she started to have right knee pain one week ago and reports the swelling radiated to her whole entire leg over the week. Pt states when she applies pressures to her foot it is very painful and she is unable to bend her right knee. Pt denies fever, vomiting, shortness of breath, hx of leg swelling in the past, hx of blood clots, right leg surgery, and hx of birth control. Pt is not a smoker and has no known drug allergies.   Past Medical History  Diagnosis Date  . HTN (hypertension)   . HLD (hyperlipidemia)   . Fatty liver     elevated LFTs in past - negative Bx  . NSVT (nonsustained ventricular tachycardia) ([place])     seen by Dr. Caryl Comes in 2011  . Nephrolithiasis   . Chest pain at rest     A.  GXT echo in 2/11: no ischemia, EF 55%   Past Surgical History  Procedure Laterality Date  . Tubal ligation  1985  . Colonoscopy    . Lithotripsy    . Left heart catheterization with coronary angiogram N/A 01/11/2012    Procedure: LEFT HEART CATHETERIZATION WITH CORONARY ANGIOGRAM;  Surgeon: Sherren Mocha, MD;  Location: [facility] CATH LAB;  Service: Cardiovascular;  Laterality: N/A;   Family History  Problem Relation Age of Onset  . Coronary artery disease Father     CABG in his 52s, died @ 43  . Lymphoma Mother     non-hodgkins - died @ 47  .  Coronary artery disease Mother    Social History  Substance Use Topics  . Smoking status: Never Smoker   . Smokeless tobacco: None  . Alcohol Use: Yes     Comment: Rare   OB History    No data available     Review of Systems  Constitutional: Negative for fever.  Respiratory: Negative for shortness of breath.   Gastrointestinal: Negative for vomiting.  Musculoskeletal: Positive for joint swelling and arthralgias.  All other systems reviewed and are negative.     Allergies  Review of patient's allergies indicates no known allergies.  Home Medications   Prior to Admission medications   Medication Sig Start Date End Date Taking? Authorizing Provider  amLODipine (NORVASC) 2.5 MG tablet Take 1 tablet (2.5 mg total) by mouth daily. 03/31/13  Yes Lelon Perla, MD  atenolol (TENORMIN) 50 MG tablet Take 50 mg by mouth 2 (two) times daily.   Yes Historical Provider, MD  furosemide (LASIX) 20 MG tablet Take 20 mg by mouth daily.     Yes Historical Provider, MD  lisinopril (PRINIVIL,ZESTRIL) 40 MG tablet Take 40 mg by mouth daily.     Yes Historical Provider, MD  meloxicam (MOBIC) 15 MG tablet Take 15 mg by mouth daily.  Yes Historical Provider, MD  omeprazole (PRILOSEC) 10 MG capsule Take 10 mg by mouth daily.   Yes Historical Provider, MD  potassium citrate (UROCIT-K) 10 MEQ (1080 MG) SR tablet Take 10 mEq by mouth 2 (two) times daily.     Yes Historical Provider, MD   Triage vitals: BP 158/86 mmHg  Pulse 80  Temp(Src) 98.4 F (36.9 C) (Oral)  Resp 18  Ht 5' 4"  (1.626 m)  Wt 205 lb (92.987 kg)  BMI 35.17 kg/m2  SpO2 98% Physical Exam  Constitutional: She is oriented to person, place, and time. She appears well-developed and well-nourished.  HENT:  Head: Normocephalic and atraumatic.  Cardiovascular: Normal rate and regular rhythm.   No murmur heard. Pulmonary/Chest: Effort normal and breath sounds normal. No respiratory distress.  Abdominal: Soft.  Musculoskeletal: She  exhibits edema and tenderness.  Mild diffuse swelling and tenderness to the right lower extremity with flexion/extension intact at the knee, 2+ DP/PT pulses bilaterally  Neurological: She is alert and oriented to person, place, and time.  Skin: Skin is warm and dry.  Psychiatric: She has a normal mood and affect. Her behavior is normal.  Nursing note and vitals reviewed.   ED Course  Procedures  DIAGNOSTIC STUDIES: Oxygen Saturation is 98% on RA, normal by my interpretation.  COORDINATION OF CARE:  6:53 PM Discussed treatment plan which includes blood test with pt at bedside and pt agreed to plan.  Labs Review Labs Reviewed  D-DIMER, QUANTITATIVE (NOT AT [facility]) - Abnormal; Notable for the following:    D-Dimer, Quant 0.52 (*)    All other components within normal limits    Imaging Review No results found. I have personally reviewed and evaluated these images and lab results as part of my medical decision-making.   EKG Interpretation None      MDM   Final diagnoses:  Leg swelling   Patient here for nontraumatic leg swelling. There is no evidence of acute infection or gout on examination. D-dimer is minimally elevated, we'll treat with one-time dose of Lovenox with outpatient ultrasound to be performed in the morning. Discussed home care with rest.   I personally performed the services described in this documentation, which was scribed in my presence. The recorded information has been reviewed and is accurate.   Quintella Reichert, MD 11/13/15 0003

## 2015-11-12 NOTE — ED Notes (Signed)
Patient states she started having right knee pain and now whole leg pain and swelling since last Tuesday.  Patient denies heat to right leg.  She states she has had therapy on right ankle.

## 2015-11-12 NOTE — Discharge Instructions (Signed)
Edema °Edema is an abnormal buildup of fluids in your body tissues. Edema is somewhat dependent on gravity to pull the fluid to the lowest place in your body. That makes the condition more common in the legs and thighs (lower extremities). Painless swelling of the feet and ankles is common and becomes more likely as you get older. It is also common in looser tissues, like around your eyes.  °When the affected area is squeezed, the fluid may move out of that spot and leave a dent for a few moments. This dent is called pitting.  °CAUSES  °There are many possible causes of edema. Eating too much salt and being on your feet or sitting for a long time can cause edema in your legs and ankles. Hot weather may make edema worse. Common medical causes of edema include: °· Heart failure. °· Liver disease. °· Kidney disease. °· Weak blood vessels in your legs. °· Cancer. °· An injury. °· Pregnancy. °· Some medications. °· Obesity.  °SYMPTOMS  °Edema is usually painless. Your skin may look swollen or shiny.  °DIAGNOSIS  °Your health care provider may be able to diagnose edema by asking about your medical history and doing a physical exam. You may need to have tests such as X-rays, an electrocardiogram, or blood tests to check for medical conditions that may cause edema.  °TREATMENT  °Edema treatment depends on the cause. If you have heart, liver, or kidney disease, you need the treatment appropriate for these conditions. General treatment may include: °· Elevation of the affected body part above the level of your heart. °· Compression of the affected body part. Pressure from elastic bandages or support stockings squeezes the tissues and forces fluid back into the blood vessels. This keeps fluid from entering the tissues. °· Restriction of fluid and salt intake. °· Use of a water pill (diuretic). These medications are appropriate only for some types of edema. They pull fluid out of your body and make you urinate more often. This  gets rid of fluid and reduces swelling, but diuretics can have side effects. Only use diuretics as directed by your health care provider. °HOME CARE INSTRUCTIONS  °· Keep the affected body part above the level of your heart when you are lying down.   °· Do not sit still or stand for prolonged periods.   °· Do not put anything directly under your knees when lying down. °· Do not wear constricting clothing or garters on your upper legs.   °· Exercise your legs to work the fluid back into your blood vessels. This may help the swelling go down.   °· Wear elastic bandages or support stockings to reduce ankle swelling as directed by your health care provider.   °· Eat a low-salt diet to reduce fluid if your health care provider recommends it.   °· Only take medicines as directed by your health care provider.  °SEEK MEDICAL CARE IF:  °· Your edema is not responding to treatment. °· You have heart, liver, or kidney disease and notice symptoms of edema. °· You have edema in your legs that does not improve after elevating them.   °· You have sudden and unexplained weight gain. °SEEK IMMEDIATE MEDICAL CARE IF:  °· You develop shortness of breath or chest pain.   °· You cannot breathe when you lie down. °· You develop pain, redness, or warmth in the swollen areas.   °· You have heart, liver, or kidney disease and suddenly get edema. °· You have a fever and your symptoms suddenly get worse. °MAKE SURE YOU:  °·   Understand these instructions. °· Will watch your condition. °· Will get help right away if you are not doing well or get worse. °  °This information is not intended to replace advice given to you by your health care provider. Make sure you discuss any questions you have with your health care provider. °  °Document Released: 12/07/2005 Document Revised: 12/28/2014 Document Reviewed: 09/29/2013 °Elsevier Interactive Patient Education ©2016 [employer] ° °

## 2015-11-13 ENCOUNTER — Ambulatory Visit (HOSPITAL_BASED_OUTPATIENT_CLINIC_OR_DEPARTMENT_OTHER)
Admission: RE | Admit: 2015-11-13 | Discharge: 2015-11-13 | Disposition: A | Discharge status: Home / Self Care | Payer: 59 | Source: Ambulatory Visit | Attending: Emergency Medicine | Admitting: Emergency Medicine

## 2015-11-13 DIAGNOSIS — M25561 Pain in right knee: Secondary | ICD-10-CM | POA: Diagnosis present

## 2015-11-13 DIAGNOSIS — M7989 Other specified soft tissue disorders: Secondary | ICD-10-CM | POA: Diagnosis not present

## 2015-11-13 DIAGNOSIS — I82401 Acute embolism and thrombosis of unspecified deep veins of right lower extremity: Secondary | ICD-10-CM

## 2016-02-19 HISTORY — PX: BREAST BIOPSY: SHX20

## 2016-02-26 ENCOUNTER — Other Ambulatory Visit: Payer: Self-pay

## 2016-02-26 DIAGNOSIS — Z1231 Encounter for screening mammogram for malignant neoplasm of breast: Secondary | ICD-10-CM

## 2016-03-05 ENCOUNTER — Ambulatory Visit
Admission: RE | Admit: 2016-03-05 | Discharge: 2016-03-05 | Disposition: A | Discharge status: Home / Self Care | Payer: 59 | Source: Ambulatory Visit

## 2016-03-05 DIAGNOSIS — Z1231 Encounter for screening mammogram for malignant neoplasm of breast: Secondary | ICD-10-CM

## 2016-03-06 ENCOUNTER — Other Ambulatory Visit: Payer: Self-pay | Admitting: Family Medicine

## 2016-03-06 DIAGNOSIS — R928 Other abnormal and inconclusive findings on diagnostic imaging of breast: Secondary | ICD-10-CM

## 2016-03-11 ENCOUNTER — Ambulatory Visit
Admission: RE | Admit: 2016-03-11 | Discharge: 2016-03-11 | Disposition: A | Discharge status: Home / Self Care | Payer: 59 | Source: Ambulatory Visit | Attending: Family Medicine | Admitting: Family Medicine

## 2016-03-11 ENCOUNTER — Other Ambulatory Visit: Payer: Self-pay | Admitting: Family Medicine

## 2016-03-11 DIAGNOSIS — R928 Other abnormal and inconclusive findings on diagnostic imaging of breast: Secondary | ICD-10-CM

## 2016-03-11 DIAGNOSIS — N6320 Unspecified lump in the left breast, unspecified quadrant: Secondary | ICD-10-CM

## 2016-03-18 ENCOUNTER — Ambulatory Visit
Admission: RE | Admit: 2016-03-18 | Discharge: 2016-03-18 | Disposition: A | Discharge status: Home / Self Care | Payer: 59 | Source: Ambulatory Visit | Attending: Family Medicine | Admitting: Family Medicine

## 2016-03-18 ENCOUNTER — Other Ambulatory Visit: Payer: Self-pay | Admitting: Family Medicine

## 2016-03-18 DIAGNOSIS — N6320 Unspecified lump in the left breast, unspecified quadrant: Secondary | ICD-10-CM

## 2016-06-20 HISTORY — PX: ACHILLES TENDON SURGERY: SHX542

## 2016-11-18 ENCOUNTER — Ambulatory Visit (INDEPENDENT_AMBULATORY_CARE_PROVIDER_SITE_OTHER): Payer: 59 | Admitting: Physician Assistant

## 2016-11-18 ENCOUNTER — Encounter (INDEPENDENT_AMBULATORY_CARE_PROVIDER_SITE_OTHER): Payer: Self-pay

## 2016-11-18 VITALS — BP 128/84 | HR 75 | Ht 64.0 in | Wt 219.0 lb

## 2016-11-18 DIAGNOSIS — Z01818 Encounter for other preprocedural examination: Secondary | ICD-10-CM | POA: Diagnosis not present

## 2016-11-18 DIAGNOSIS — I10 Essential (primary) hypertension: Secondary | ICD-10-CM

## 2016-11-18 NOTE — Progress Notes (Signed)
Cardiology Office Note Date:  11/18/2016  Patient ID:  Dawn Moon September 19, 1956, MRN [id number] PCP:  Vidal Schwalbe, MD  Cardiologist:  Dr. Stanford Breed   Chief Complaint:  pre-op R knee surgery  History of Present Illness: Dawn Moon is a 60 y.o. female with history of HTN, HLD comes in to the office today for pre-operative risk assessment.  She comes today to be seen for Dr. Stanford Breed, though last seen by Dr. Caryl Comes in 2015, at that time with some c/o CP followed w/ coronary CTA as noted (no significant CAD).  She reports today feeling well outside if her knee pain which is her only limiting factor.  She had left achilles surgery only a couple months ago and developed right knee injury during her rehab.  She had no surgical complications with her achilles surgery.  She denies any kind of CP, no palpitations, no SOB, no near syncope or syncope.  She is feeling quite well.  Reports prior to her achilles injury she was walking several miles regularly with a portion of this including a short jog as well with no exertional intolerances.   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 Medical management is recommended. IMPRESSION: 1. No significant incidental noncardiac findings noted.  01/18/12: TTE Study Conclusions Left ventricle: The cavity size was normal. Wall thickness was increased in a pattern of mild LVH. The estimated ejection fraction was 60%. Wall motion was normal; there were no regional wall motion abnormalities.  No significant VHD described  01/11/12: LHC Final Conclusions:   1. Widely patent coronary arteries without significant obstructive CAD 2. Normal left ventricular function   Past Medical History:  Diagnosis Date  . Chest pain at rest    A.  GXT echo in 2/11: no ischemia, EF 55%  . Fatty liver    elevated LFTs in past - negative Bx  . HLD (hyperlipidemia)   . HTN (hypertension)   .  Nephrolithiasis   . NSVT (nonsustained ventricular tachycardia) ([facility])    seen by Dr. Caryl Comes in 2011    Past Surgical History:  Procedure Laterality Date  . COLONOSCOPY    . LEFT HEART CATHETERIZATION WITH CORONARY ANGIOGRAM N/A 01/11/2012   Procedure: LEFT HEART CATHETERIZATION WITH CORONARY ANGIOGRAM;  Surgeon: Sherren Mocha, MD;  Location: [facility] CATH LAB;  Service: Cardiovascular;  Laterality: N/A;  . LITHOTRIPSY    . TUBAL LIGATION  1985    Current Outpatient Prescriptions  Medication Sig Dispense Refill  . atenolol (TENORMIN) 50 MG tablet Take 50 mg by mouth daily.     . cholecalciferol (VITAMIN D) 1000 units tablet Take 1,000 Units by mouth daily.    . Cyanocobalamin (VITAMIN B-12 PO) Take by mouth daily.    . furosemide (LASIX) 20 MG tablet Take 20 mg by mouth daily.      . pantoprazole (PROTONIX) 40 MG tablet Take 40 mg by mouth daily.    . potassium citrate (UROCIT-K) 10 MEQ (1080 MG) SR tablet Take 10 mEq by mouth 2 (two) times daily.      . valsartan (DIOVAN) 320 MG tablet Take 320 mg by mouth daily.     No current facility-administered medications for this visit.     Allergies:   Patient has no known allergies.   Social History:  The patient  reports that she has never smoked. She does not have any smokeless tobacco history on file. She reports that she drinks alcohol. She reports that she does  not use drugs.   Family History:  The patient's family history includes Coronary artery disease in her father and mother; Lymphoma in her mother.  ROS:  Please see the history of present illness.  All other systems are reviewed and otherwise negative.   PHYSICAL EXAM:  VS:  BP 128/84   Pulse 75   Ht 5' 4"  (1.626 m)   Wt 219 lb (99.3 kg)   BMI 37.59 kg/m  BMI: Body mass index is 37.59 kg/m. Well nourished, well developed, in no acute distress  HEENT: normocephalic, atraumatic  Neck: no JVD, carotid bruits or masses Cardiac: RRR; no significant murmurs, no rubs, or  gallops Lungs:  clear to auscultation bilaterally, no wheezing, rhonchi or rales  Abd: soft, nontender MS: no deformity or atrophy Ext:  No edema  Skin: warm and dry, no rash Neuro:  No gross deficits appreciated Psych: euthymic mood, full affect   EKG:  Done today reviewed by myself and Dr. Marlou Porch shows SR, appears unchanged from previous  Recent Labs: No results found for requested labs within last 8760 hours.  No results found for requested labs within last 8760 hours.   CrCl cannot be calculated (Patient's most recent lab result is older than the maximum 21 days allowed.).   Wt Readings from Last 3 Encounters:  11/18/16 219 lb (99.3 kg)  11/12/15 205 lb (93 kg)  01/01/14 204 lb (92.5 kg)     Other studies reviewed: Additional studies/records reviewed today include: summarized above  ASSESSMENT AND PLAN:  1. Pre-op (right knee surgery, scheduling pending cardiac clearance)     She has no cardiac symptoms with CTa of coronaries in 2015c with no significant CAD     Reviewed with DOD, Dr. Marlou Porch, she is felt to be a low cardiac risk for her planned knee surgery  2. HTN     Discussed life style diet/weigt loss, and once able get started back on her exercise regime of regular walking that she was doing prior to her achilles injury  3. HLD     She reports monitored and managed with her PMD   Disposition: F/u with Dr. Stanford Breed in 1 year or PRN  Current medicines are reviewed at length with the patient today.  The patient did not have any concerns regarding medicines.  [name], PA-C 11/18/2016 2:18 PM     [address] [place] [address] [phone] (office)  [phone] (fax)

## 2016-11-18 NOTE — Patient Instructions (Signed)
Medication Instructions:   Your physician recommends that you continue on your current medications as directed. Please refer to the Current Medication list given to you today.   If you need a refill on your cardiac medications before your next appointment, please call your pharmacy.  Labwork:  NONE ORDERED  TODAY    Testing/Procedures:  NONE ORDERED  TODAY    Follow-Up:  Your physician wants you to follow-up in: [place] will receive a reminder letter in the mail two months in advance. If you don't receive a letter, please call our office to schedule the follow-up appointment.     Any Other Special Instructions Will Be Listed Below (If Applicable).

## 2016-11-20 ENCOUNTER — Ambulatory Visit: Payer: Self-pay | Admitting: Orthopedic Surgery

## 2016-11-26 ENCOUNTER — Institutional Professional Consult (permissible substitution): Payer: 59 | Admitting: Cardiology

## 2016-12-03 ENCOUNTER — Encounter (HOSPITAL_BASED_OUTPATIENT_CLINIC_OR_DEPARTMENT_OTHER): Payer: Self-pay | Admitting: *Deleted

## 2016-12-03 NOTE — Progress Notes (Signed)
NPO AFTER MN.  ARRIVE AT 0630.  NEEDS ISTAT 8.  CURRENT EKG IN CHART AND EPIC.  WILL TAKE AM MEDS DOS W/ SIPS OF WATER WITH EXCEPTION NO LASIX.

## 2016-12-04 ENCOUNTER — Encounter (HOSPITAL_BASED_OUTPATIENT_CLINIC_OR_DEPARTMENT_OTHER)
Admission: RE | Disposition: A | Discharge status: Home / Self Care | Payer: Self-pay | Source: Ambulatory Visit | Attending: Orthopedic Surgery

## 2016-12-04 ENCOUNTER — Ambulatory Visit (HOSPITAL_BASED_OUTPATIENT_CLINIC_OR_DEPARTMENT_OTHER)
Admission: RE | Admit: 2016-12-04 | Discharge: 2016-12-04 | Disposition: A | Discharge status: Home / Self Care | Payer: 59 | Source: Ambulatory Visit | Attending: Orthopedic Surgery | Admitting: Orthopedic Surgery

## 2016-12-04 ENCOUNTER — Ambulatory Visit (HOSPITAL_BASED_OUTPATIENT_CLINIC_OR_DEPARTMENT_OTHER): Payer: 59 | Admitting: Anesthesiology

## 2016-12-04 ENCOUNTER — Encounter (HOSPITAL_BASED_OUTPATIENT_CLINIC_OR_DEPARTMENT_OTHER): Payer: Self-pay | Admitting: *Deleted

## 2016-12-04 DIAGNOSIS — K589 Irritable bowel syndrome without diarrhea: Secondary | ICD-10-CM | POA: Diagnosis not present

## 2016-12-04 DIAGNOSIS — I10 Essential (primary) hypertension: Secondary | ICD-10-CM | POA: Diagnosis not present

## 2016-12-04 DIAGNOSIS — M2241 Chondromalacia patellae, right knee: Secondary | ICD-10-CM | POA: Diagnosis not present

## 2016-12-04 DIAGNOSIS — Y998 Other external cause status: Secondary | ICD-10-CM | POA: Diagnosis not present

## 2016-12-04 DIAGNOSIS — Z87442 Personal history of urinary calculi: Secondary | ICD-10-CM | POA: Diagnosis not present

## 2016-12-04 DIAGNOSIS — I471 Supraventricular tachycardia: Secondary | ICD-10-CM | POA: Diagnosis not present

## 2016-12-04 DIAGNOSIS — E559 Vitamin D deficiency, unspecified: Secondary | ICD-10-CM | POA: Diagnosis not present

## 2016-12-04 DIAGNOSIS — M722 Plantar fascial fibromatosis: Secondary | ICD-10-CM | POA: Insufficient documentation

## 2016-12-04 DIAGNOSIS — K7581 Nonalcoholic steatohepatitis (NASH): Secondary | ICD-10-CM | POA: Diagnosis not present

## 2016-12-04 DIAGNOSIS — Z8249 Family history of ischemic heart disease and other diseases of the circulatory system: Secondary | ICD-10-CM | POA: Insufficient documentation

## 2016-12-04 DIAGNOSIS — Y92531 Health care provider office as the place of occurrence of the external cause: Secondary | ICD-10-CM | POA: Insufficient documentation

## 2016-12-04 DIAGNOSIS — S83241A Other tear of medial meniscus, current injury, right knee, initial encounter: Secondary | ICD-10-CM | POA: Diagnosis not present

## 2016-12-04 DIAGNOSIS — Y93B9 Activity, other involving muscle strengthening exercises: Secondary | ICD-10-CM | POA: Insufficient documentation

## 2016-12-04 DIAGNOSIS — Z808 Family history of malignant neoplasm of other organs or systems: Secondary | ICD-10-CM | POA: Insufficient documentation

## 2016-12-04 DIAGNOSIS — K219 Gastro-esophageal reflux disease without esophagitis: Secondary | ICD-10-CM | POA: Insufficient documentation

## 2016-12-04 DIAGNOSIS — X501XXA Overexertion from prolonged static or awkward postures, initial encounter: Secondary | ICD-10-CM | POA: Insufficient documentation

## 2016-12-04 DIAGNOSIS — M170 Bilateral primary osteoarthritis of knee: Secondary | ICD-10-CM | POA: Insufficient documentation

## 2016-12-04 DIAGNOSIS — M7661 Achilles tendinitis, right leg: Secondary | ICD-10-CM | POA: Diagnosis not present

## 2016-12-04 HISTORY — DX: Personal history of other medical treatment: Z92.89

## 2016-12-04 HISTORY — DX: Other specified postprocedural states: Z98.890

## 2016-12-04 HISTORY — PX: KNEE ARTHROSCOPY: SHX127

## 2016-12-04 HISTORY — DX: Nonalcoholic steatohepatitis (NASH): K75.81

## 2016-12-04 HISTORY — DX: Vitamin D deficiency, unspecified: E55.9

## 2016-12-04 HISTORY — DX: Personal history of other diseases of the circulatory system: Z86.79

## 2016-12-04 HISTORY — DX: Personal history of urinary calculi: Z87.442

## 2016-12-04 HISTORY — DX: Nausea with vomiting, unspecified: R11.2

## 2016-12-04 HISTORY — DX: Gastro-esophageal reflux disease without esophagitis: K21.9

## 2016-12-04 HISTORY — DX: Irritable bowel syndrome, unspecified: K58.9

## 2016-12-04 HISTORY — DX: Unspecified osteoarthritis, unspecified site: M19.90

## 2016-12-04 LAB — POCT I-STAT 4, (NA,K, GLUC, HGB,HCT)
GLUCOSE: 102 mg/dL — AB (ref 65–99)
HEMATOCRIT: 39 % (ref 36.0–46.0)
Hemoglobin: 13.3 g/dL (ref 12.0–15.0)
POTASSIUM: 3.4 mmol/L — AB (ref 3.5–5.1)
SODIUM: 144 mmol/L (ref 135–145)

## 2016-12-04 SURGERY — ARTHROSCOPY, KNEE
Anesthesia: General | Site: Knee | Laterality: Right

## 2016-12-04 MED ORDER — LACTATED RINGERS IV SOLN
INTRAVENOUS | Status: DC
  Administered 2016-12-04 (×3): via INTRAVENOUS
  Filled 2016-12-04: qty 1000

## 2016-12-04 MED ORDER — FENTANYL CITRATE (PF) 100 MCG/2ML IJ SOLN
INTRAMUSCULAR | Status: AC
  Filled 2016-12-04: qty 2

## 2016-12-04 MED ORDER — CHLORHEXIDINE GLUCONATE 4 % EX LIQD
60.0000 mL | Freq: Once | CUTANEOUS | Status: DC
  Filled 2016-12-04: qty 118

## 2016-12-04 MED ORDER — LIDOCAINE 2% (20 MG/ML) 5 ML SYRINGE
INTRAMUSCULAR | Status: DC | PRN
  Administered 2016-12-04: 80 mg via INTRAVENOUS

## 2016-12-04 MED ORDER — KETOROLAC TROMETHAMINE 30 MG/ML IJ SOLN
INTRAMUSCULAR | Status: AC
  Filled 2016-12-04: qty 1

## 2016-12-04 MED ORDER — KETOROLAC TROMETHAMINE 30 MG/ML IJ SOLN
30.0000 mg | Freq: Once | INTRAMUSCULAR | Status: DC | PRN
  Filled 2016-12-04: qty 1

## 2016-12-04 MED ORDER — FENTANYL CITRATE (PF) 100 MCG/2ML IJ SOLN
INTRAMUSCULAR | Status: DC | PRN
  Administered 2016-12-04: 100 ug via INTRAVENOUS
  Administered 2016-12-04 (×2): 50 ug via INTRAVENOUS

## 2016-12-04 MED ORDER — OXYCODONE HCL 5 MG PO TABS
5.0000 mg | ORAL_TABLET | Freq: Once | ORAL | Status: AC | PRN
  Administered 2016-12-04: 5 mg via ORAL
  Filled 2016-12-04: qty 1

## 2016-12-04 MED ORDER — LIDOCAINE-EPINEPHRINE 1 %-1:100000 IJ SOLN
INTRAMUSCULAR | Status: AC
  Filled 2016-12-04: qty 1

## 2016-12-04 MED ORDER — HYDROCODONE-ACETAMINOPHEN 5-325 MG PO TABS: 1.0000 | ORAL_TABLET | ORAL | 0 refills | Status: DC | PRN

## 2016-12-04 MED ORDER — CEFAZOLIN SODIUM-DEXTROSE 2-4 GM/100ML-% IV SOLN
2.0000 g | INTRAVENOUS | Status: AC
  Administered 2016-12-04: 2 g via INTRAVENOUS
  Filled 2016-12-04: qty 100

## 2016-12-04 MED ORDER — ONDANSETRON HCL 4 MG/2ML IJ SOLN
INTRAMUSCULAR | Status: DC | PRN
  Administered 2016-12-04: 4 mg via INTRAVENOUS

## 2016-12-04 MED ORDER — PHENYLEPHRINE HCL 10 MG/ML IJ SOLN
INTRAMUSCULAR | Status: DC | PRN
  Administered 2016-12-04 (×3): 80 ug via INTRAVENOUS

## 2016-12-04 MED ORDER — FENTANYL CITRATE (PF) 100 MCG/2ML IJ SOLN
INTRAMUSCULAR | Status: AC
  Filled 2016-12-04: qty 4

## 2016-12-04 MED ORDER — PROPOFOL 10 MG/ML IV BOLUS
INTRAVENOUS | Status: AC
  Filled 2016-12-04: qty 40

## 2016-12-04 MED ORDER — PROMETHAZINE HCL 25 MG/ML IJ SOLN
6.2500 mg | INTRAMUSCULAR | Status: DC | PRN
  Filled 2016-12-04: qty 1

## 2016-12-04 MED ORDER — ONDANSETRON HCL 4 MG/2ML IJ SOLN
INTRAMUSCULAR | Status: AC
  Filled 2016-12-04: qty 2

## 2016-12-04 MED ORDER — EPINEPHRINE PF 1 MG/ML IJ SOLN
INTRAMUSCULAR | Status: AC
  Filled 2016-12-04: qty 1

## 2016-12-04 MED ORDER — LIDOCAINE 2% (20 MG/ML) 5 ML SYRINGE
INTRAMUSCULAR | Status: AC
  Filled 2016-12-04: qty 5

## 2016-12-04 MED ORDER — OXYCODONE HCL 5 MG/5ML PO SOLN
5.0000 mg | Freq: Once | ORAL | Status: AC | PRN
  Filled 2016-12-04: qty 5

## 2016-12-04 MED ORDER — PROPOFOL 10 MG/ML IV BOLUS
INTRAVENOUS | Status: DC | PRN
  Administered 2016-12-04: 270 mg via INTRAVENOUS

## 2016-12-04 MED ORDER — SODIUM CHLORIDE 0.9 % IV SOLN
INTRAVENOUS | Status: DC
  Filled 2016-12-04: qty 1000

## 2016-12-04 MED ORDER — PHENYLEPHRINE 40 MCG/ML (10ML) SYRINGE FOR IV PUSH (FOR BLOOD PRESSURE SUPPORT)
PREFILLED_SYRINGE | INTRAVENOUS | Status: AC
  Filled 2016-12-04: qty 10

## 2016-12-04 MED ORDER — BUPIVACAINE HCL (PF) 0.25 % IJ SOLN
INTRAMUSCULAR | Status: AC
  Filled 2016-12-04: qty 30

## 2016-12-04 MED ORDER — BUPIVACAINE-EPINEPHRINE 0.25% -1:200000 IJ SOLN
INTRAMUSCULAR | Status: DC | PRN
  Administered 2016-12-04: 20 mL

## 2016-12-04 MED ORDER — MIDAZOLAM HCL 5 MG/5ML IJ SOLN
INTRAMUSCULAR | Status: DC | PRN
  Administered 2016-12-04: 2 mg via INTRAVENOUS

## 2016-12-04 MED ORDER — ASPIRIN EC 325 MG PO TBEC: 325.0000 mg | DELAYED_RELEASE_TABLET | Freq: Two times a day (BID) | ORAL | 0 refills | Status: DC

## 2016-12-04 MED ORDER — ARTIFICIAL TEARS OP OINT
TOPICAL_OINTMENT | OPHTHALMIC | Status: AC
  Filled 2016-12-04: qty 3.5

## 2016-12-04 MED ORDER — OXYCODONE HCL 5 MG PO TABS
ORAL_TABLET | ORAL | Status: AC
  Filled 2016-12-04: qty 1

## 2016-12-04 MED ORDER — GLYCOPYRROLATE 0.2 MG/ML IV SOSY
PREFILLED_SYRINGE | INTRAVENOUS | Status: AC
  Filled 2016-12-04: qty 3

## 2016-12-04 MED ORDER — MIDAZOLAM HCL 2 MG/2ML IJ SOLN
INTRAMUSCULAR | Status: AC
  Filled 2016-12-04: qty 2

## 2016-12-04 MED ORDER — SUCCINYLCHOLINE CHLORIDE 200 MG/10ML IV SOSY
PREFILLED_SYRINGE | INTRAVENOUS | Status: AC
  Filled 2016-12-04: qty 10

## 2016-12-04 MED ORDER — CEFAZOLIN SODIUM-DEXTROSE 2-4 GM/100ML-% IV SOLN
INTRAVENOUS | Status: AC
  Filled 2016-12-04: qty 100

## 2016-12-04 MED ORDER — LIDOCAINE-EPINEPHRINE 1 %-1:100000 IJ SOLN
INTRAMUSCULAR | Status: DC | PRN
  Administered 2016-12-04: 20 mL

## 2016-12-04 MED ORDER — DEXAMETHASONE SODIUM PHOSPHATE 4 MG/ML IJ SOLN
INTRAMUSCULAR | Status: DC | PRN
  Administered 2016-12-04: 10 mg via INTRAVENOUS

## 2016-12-04 MED ORDER — POVIDONE-IODINE 10 % EX SWAB
2.0000 "application " | Freq: Once | CUTANEOUS | Status: DC
  Filled 2016-12-04: qty 2

## 2016-12-04 MED ORDER — FENTANYL CITRATE (PF) 100 MCG/2ML IJ SOLN
25.0000 ug | INTRAMUSCULAR | Status: DC | PRN
  Filled 2016-12-04: qty 1

## 2016-12-04 MED ORDER — KETOROLAC TROMETHAMINE 30 MG/ML IJ SOLN
INTRAMUSCULAR | Status: DC | PRN
  Administered 2016-12-04: 30 mg via INTRAVENOUS

## 2016-12-04 SURGICAL SUPPLY — 45 items
BANDAGE ACE 6X5 VEL STRL LF (GAUZE/BANDAGES/DRESSINGS) ×2 IMPLANT
BANDAGE ELASTIC 6 VELCRO ST LF (GAUZE/BANDAGES/DRESSINGS) ×3 IMPLANT
BLADE CUDA 4.2 (BLADE) IMPLANT
BLADE CUDA SHAVER 3.5 (BLADE) ×3 IMPLANT
BLADE GREAT WHITE 4.2 (BLADE) ×1 IMPLANT
BLADE GREAT WHITE 4.2MM (BLADE) ×1
CHLORAPREP W/TINT 26ML (MISCELLANEOUS) ×3 IMPLANT
DRAPE ARTHROSCOPY W/POUCH 114 (DRAPES) ×3 IMPLANT
DRAPE U-SHAPE 47X51 STRL (DRAPES) ×3 IMPLANT
DRSG EMULSION OIL 3X3 NADH (GAUZE/BANDAGES/DRESSINGS) ×3 IMPLANT
DRSG PAD ABDOMINAL 8X10 ST (GAUZE/BANDAGES/DRESSINGS) ×3 IMPLANT
ELECT MENISCUS 165MM 90D (ELECTRODE) IMPLANT
ELECT REM PT RETURN 9FT ADLT (ELECTROSURGICAL)
ELECTRODE REM PT RTRN 9FT ADLT (ELECTROSURGICAL) IMPLANT
GAUZE SPONGE 4X4 12PLY STRL (GAUZE/BANDAGES/DRESSINGS) ×3 IMPLANT
GAUZE XEROFORM 1X8 LF (GAUZE/BANDAGES/DRESSINGS) ×2 IMPLANT
GLOVE BIO SURGEON STRL SZ8.5 (GLOVE) ×3 IMPLANT
GLOVE INDICATOR 8.5 STRL (GLOVE) ×3 IMPLANT
GOWN STRL REUS W/TWL 2XL LVL3 (GOWN DISPOSABLE) ×3 IMPLANT
GOWN STRL REUS W/TWL XL LVL3 (GOWN DISPOSABLE) ×3 IMPLANT
HOLDER KNEE FOAM BLUE (MISCELLANEOUS) IMPLANT
IV NS IRRIG 3000ML ARTHROMATIC (IV SOLUTION) ×6 IMPLANT
KIT ROOM TURNOVER WOR (KITS) ×3 IMPLANT
KNEE WRAP E Z 3 GEL PACK (MISCELLANEOUS) ×3 IMPLANT
MANIFOLD NEPTUNE II (INSTRUMENTS) ×3 IMPLANT
NDL SAFETY ECLIPSE 18X1.5 (NEEDLE) ×1 IMPLANT
NEEDLE HYPO 18GX1.5 SHARP (NEEDLE) ×3
PACK ARTHROSCOPY DSU (CUSTOM PROCEDURE TRAY) ×3 IMPLANT
PACK BASIN DAY SURGERY FS (CUSTOM PROCEDURE TRAY) ×3 IMPLANT
PAD ABD 8X10 STRL (GAUZE/BANDAGES/DRESSINGS) ×3 IMPLANT
PAD FOR LEG HOLDER (MISCELLANEOUS) IMPLANT
PADDING CAST COTTON 6X4 STRL (CAST SUPPLIES) ×3 IMPLANT
PENCIL BUTTON HOLSTER BLD 10FT (ELECTRODE) IMPLANT
PROBE BIPOLAR 50 DEGREE SUCT (MISCELLANEOUS) ×2 IMPLANT
SET ARTHROSCOPY TUBING (MISCELLANEOUS) ×3
SET ARTHROSCOPY TUBING LN (MISCELLANEOUS) ×1 IMPLANT
SUT ETHILON 3 0 PS 1 (SUTURE) ×3 IMPLANT
SUT ETHILON 4 0 PS 2 18 (SUTURE) IMPLANT
SYR 30ML LL (SYRINGE) ×3 IMPLANT
TOWEL OR 17X24 6PK STRL BLUE (TOWEL DISPOSABLE) ×3 IMPLANT
TUBE CONNECTING 12'X1/4 (SUCTIONS) ×1
TUBE CONNECTING 12X1/4 (SUCTIONS) ×2 IMPLANT
WAND 30 DEG SABER W/CORD (SURGICAL WAND) IMPLANT
WAND 90 DEG TURBOVAC W/CORD (SURGICAL WAND) IMPLANT
WATER STERILE IRR 500ML POUR (IV SOLUTION) ×3 IMPLANT

## 2016-12-04 NOTE — Transfer of Care (Signed)
Immediate Anesthesia Transfer of Care Note  Patient: Dawn Moon  Procedure(s) Performed: Procedure(s): ARTHROSCOPY KNEE WITH PARTIAL MEDIAL MENISCECTOMY (Right)  Patient Location: PACU  Anesthesia Type:General  Level of Consciousness: awake, alert , oriented and patient cooperative  Airway & Oxygen Therapy: Patient Spontanous Breathing and Patient connected to nasal cannula oxygen  Post-op Assessment: Report given to RN and Post -op Vital signs reviewed and stable  Post vital signs: Reviewed and stable  Last Vitals:  Vitals:   12/04/16 0635 12/04/16 1108  BP: 138/76 (P) 140/70  Pulse: 80 (P) 84  Resp: 16 (!) (P) 8  Temp: 36.8 C (P) 36.7 C    Last Pain:  Vitals:   12/04/16 0706  TempSrc:   PainSc: 2          Complications: No apparent anesthesia complications

## 2016-12-04 NOTE — Anesthesia Preprocedure Evaluation (Addendum)
Anesthesia Evaluation  Patient identified by MRN, date of birth, ID band Patient awake    Reviewed: Allergy & Precautions, NPO status , Patient's Chart, lab work & pertinent test results  History of Anesthesia Complications (+) PONV  Airway Mallampati: III  TM Distance: <3 FB Neck ROM: Full    Dental no notable dental hx.    Pulmonary neg pulmonary ROS,    Pulmonary exam normal breath sounds clear to auscultation       Cardiovascular hypertension, Normal cardiovascular exam Rhythm:Regular Rate:Normal  Hx SVT on beta blocker   Neuro/Psych negative neurological ROS  negative psych ROS   GI/Hepatic GERD  Medicated,NASH   Endo/Other  negative endocrine ROS  Renal/GU negative Renal ROS  negative genitourinary   Musculoskeletal negative musculoskeletal ROS (+) Arthritis , Osteoarthritis,    Abdominal   Peds negative pediatric ROS (+)  Hematology negative hematology ROS (+)   Anesthesia Other Findings   Reproductive/Obstetrics negative OB ROS                            Anesthesia Physical Anesthesia Plan  ASA: III  Anesthesia Plan: General   Post-op Pain Management:    Induction: Intravenous  Airway Management Planned: LMA  Additional Equipment:   Intra-op Plan:   Post-operative Plan: Extubation in OR  Informed Consent: I have reviewed the patients History and Physical, chart, labs and discussed the procedure including the risks, benefits and alternatives for the proposed anesthesia with the patient or authorized representative who has indicated his/her understanding and acceptance.   Dental advisory given  Plan Discussed with: CRNA and Surgeon  Anesthesia Plan Comments:         Anesthesia Quick Evaluation

## 2016-12-04 NOTE — H&P (Signed)
PREOPERATIVE H&P  Chief Complaint: RIGHT KNEE MEDIAL MENISCUS TEAR  HPI: Dawn Moon is a 60 y.o. female who presents for preoperative history and physical with a diagnosis of RIGHT KNEE MEDIAL MENISCUS TEAR. Symptoms are rated as moderate to severe, and have been worsening.  This is significantly impairing activities of daily living.  She has elected for surgical management.   Past Medical History:  Diagnosis Date  . GERD (gastroesophageal reflux disease)   . History of exercise stress test    03-07-2010  Stress echo--- no arrhythmias or conduction abnormalilites and negative for ischemia or chest pain  . History of kidney stones   . History of paroxysmal supraventricular tachycardia    episode 2011  consult w/ dr Caryl Comes --  put on atenolol--  per pt no longer an issue  . HTN (hypertension)    cardiologist --  dr Stanford Breed  . IBS (irritable bowel syndrome)    diarrhea  . Nonalcoholic steatohepatitis (NASH)   . OA (osteoarthritis)    knees  . PONV (postoperative nausea and vomiting)   . Right knee meniscal tear   . Vitamin D deficiency    Past Surgical History:  Procedure Laterality Date  . ACHILLES TENDON SURGERY Left 06/2016  . BREAST BIOPSY Left 02/2016   benign  . COLONOSCOPY  2005  . CT CTA CORONARY W/CA SCORE W/CM &/OR WO/CM  01/01/2014   non-obstructive calcified plaque in pLAD (0-25%), no significant incidental noncardiac findings noted  . ESOPHAGOGASTRODUODENOSCOPY  01/2014  . EXTRACORPOREAL SHOCK WAVE LITHOTRIPSY  2010  . LEFT HEART CATHETERIZATION WITH CORONARY ANGIOGRAM N/A 01/11/2012   Procedure: LEFT HEART CATHETERIZATION WITH CORONARY ANGIOGRAM;  Surgeon: Sherren Mocha, MD;  Location: [facility] CATH LAB;  Service: Cardiovascular;  Laterality: N/A;  widely patent coronary arterires without significant obstructive CAD,  normal LVF, ef 55-56%  . TONSILLECTOMY AND ADENOIDECTOMY  child  . TRANSTHORACIC ECHOCARDIOGRAM  01/18/2012   mild LVH,  ef 60%/  trivial TR  .  TUBAL LIGATION  1985   Social History   Social History  . Marital status: Widowed    Spouse name: N/A  . Number of children: N/A  . Years of education: N/A   Social History Main Topics  . Smoking status: Never Smoker  . Smokeless tobacco: Never Used  . Alcohol use Yes     Comment: Rare  . Drug use: No  . Sexual activity: Not Asked   Other Topics Concern  . None   Social History Narrative   Does not routinely exercise.  Has to walk up stairs to work and can do w/o Ss.  Lives @ home in [place] with family.   Family History  Problem Relation Age of Onset  . Lymphoma Mother     non-hodgkins - died @ 7  . Coronary artery disease Mother   . Coronary artery disease Father     CABG in his 16s, died @ 63   No Known Allergies Prior to Admission medications   Medication Sig Start Date End Date Taking? Authorizing Provider  atenolol (TENORMIN) 50 MG tablet Take 50 mg by mouth every morning.    Yes Historical Provider, MD  Cholecalciferol (VITAMIN D3) 5000 units CAPS Take 1 capsule by mouth every morning.   Yes Historical Provider, MD  Cyanocobalamin (VITAMIN B-12 PO) Take by mouth daily.   Yes Historical Provider, MD  Eluxadoline (VIBERZI) 100 MG TABS Take 1 tablet by mouth 2 (two) times daily.    Yes Historical Provider, MD  furosemide (LASIX) 20 MG tablet Take 20 mg by mouth every morning.    Yes Historical Provider, MD  pantoprazole (PROTONIX) 40 MG tablet Take 40 mg by mouth every morning.    Yes Historical Provider, MD  potassium citrate (UROCIT-K) 10 MEQ (1080 MG) SR tablet Take 10 mEq by mouth 2 (two) times daily.     Yes Historical Provider, MD  valsartan (DIOVAN) 320 MG tablet Take 320 mg by mouth every morning.    Yes Historical Provider, MD     Positive ROS: All other systems have been reviewed and were otherwise negative with the exception of those mentioned in the HPI and as above.  Physical Exam: General: Alert, no acute distress Cardiovascular: No pedal  edema Respiratory: No cyanosis, no use of accessory musculature GI: No organomegaly, abdomen is soft and non-tender Skin: No lesions in the area of chief complaint Neurologic: Sensation intact distally Psychiatric: Patient is competent for consent with normal mood and affect Lymphatic: No axillary or cervical lymphadenopathy  MUSCULOSKELETAL: (+) TTP medial joint line with (+) McMurray.  Assessment: RIGHT KNEE MEDIAL MENISCUS TEAR  Plan: Plan for Procedure(s): ARTHROSCOPY KNEE WITH PARTIAL MEDIAL MENISCECTOMY  The risks benefits and alternatives were discussed with the patient including but not limited to the risks of nonoperative treatment, versus surgical intervention including infection, bleeding, nerve injury,  blood clots, cardiopulmonary complications, morbidity, mortality, among others, and they were willing to proceed.   Keonta Monceaux, Horald Pollen, MD Cell [phone]   12/04/2016 8:25 AM

## 2016-12-04 NOTE — Anesthesia Procedure Notes (Addendum)
Procedure Name: LMA Insertion Date/Time: 12/04/2016 9:35 AM Performed by: Wanita Chamberlain Pre-anesthesia Checklist: Patient identified, Timeout performed, Emergency Drugs available, Suction available and Patient being monitored Patient Re-evaluated:Patient Re-evaluated prior to inductionOxygen Delivery Method: Circle system utilized Preoxygenation: Pre-oxygenation with 100% oxygen Intubation Type: IV induction Ventilation: Mask ventilation without difficulty LMA: LMA inserted LMA Size: 3.0 Number of attempts: 1 Placement Confirmation: positive ETCO2 and breath sounds checked- equal and bilateral Tube secured with: Tape Dental Injury: Teeth and Oropharynx as per pre-operative assessment  Difficulty Due To: Difficulty was anticipated, Difficult Airway- due to limited oral opening and Difficult Airway- due to reduced neck mobility Comments: #3 LMA placed with ease

## 2016-12-04 NOTE — Anesthesia Postprocedure Evaluation (Signed)
Anesthesia Post Note  Patient: Dawn Moon  Procedure(s) Performed: Procedure(s) (LRB): ARTHROSCOPY KNEE WITH PARTIAL MEDIAL MENISCECTOMY (Right)  Patient location during evaluation: PACU Anesthesia Type: General Level of consciousness: awake and alert Pain management: pain level controlled Vital Signs Assessment: post-procedure vital signs reviewed and stable Respiratory status: spontaneous breathing, nonlabored ventilation, respiratory function stable and patient connected to nasal cannula oxygen Cardiovascular status: blood pressure returned to baseline and stable Postop Assessment: no signs of nausea or vomiting Anesthetic complications: no    Last Vitals:  Vitals:   12/04/16 0635 12/04/16 1108  BP: 138/76 (P) 140/70  Pulse: 80 (P) 84  Resp: 16 (!) (P) 8  Temp: 36.8 C (P) 36.7 C    Last Pain:  Vitals:   12/04/16 0706  TempSrc:   PainSc: 2                  Raysa Bosak S

## 2016-12-04 NOTE — Op Note (Signed)
Preoperative diagnosis-  Right knee medial meniscal tear  Postoperative diagnosis Right- knee medial meniscal tear plus extensive grade III chondromalacia of the medial and patellofemoral compartments  Procedure- Right knee arthroscopy with medial  meniscal debridement chondroplasty of medial and patellofemoral compartments.   Surgeon- Rod Can, MD  Anesthesia-General  EBL-  Minimal  Complications- None  Condition- PACU - hemodynamically stable.  Brief clinical note- the patient is a 60 year old female who sustained an acute right knee medial meniscal tear when she twisted her knee while in physical therapy for her Achilles tendinitis and plantar fasciitis. She had a symptomatic medial meniscal tear. MRI confirmed her meniscal tear as well as medial and patellofemoral arthritis. She failed conservative management and wished to pursue arthroscopic debridement. We discussed the risks, benefits, and alternatives.  Procedure in detail -       After successful administration of General anesthetic, a tourmiquet is placed high on the Right  thigh and the Right lower extremity is prepped and draped in the usual sterile fashion. Time out is performed by the surgical team. Standard inferolateral portal site was created with an 11 blade. I introduced the arthroscope into the inferolateral portal and inflow was initiated. Arthroscopic visualization proceeds.       The undersurface of the patella and trochlea are visualized and found to have extensive grade 3 changes to the trochlea and undersurface of the patella. The medial and lateral gutters are visualized and there are no loose bodies. Flexion and valgus force is applied to the knee and the medial compartment is entered. A spinal needle is passed into the joint through the site marked for the inferomedial portal. A small incision is made and the dilator passed into the joint. The findings for the medial compartment are large complex degenerative  meniscal tear involving the body of the medial meniscus, over 40% of the meniscus. She also had extensive grade 3 chondromalacia changes to the distal femur and proximal tibia. The tear is debrided to a stable base with baskets and a shaver and sealed off with the Arthrocare. The shaver is used to debride the unstable cartilage to a stable cartilaginous base with stable edges. It is probed and found to be stable.     The intercondylar notch is visualized and the ACL appears normal. The lateral compartment is entered and their is some grade 1 chondromalacia change. The meniscus was probed and found to be intact.      The joint is again inspected and there are no other tears, defects or loose bodies identified. The arthroscopic equipment is then removed from the portals which are closed with interrupted 3-0 nylon. 20 ml of .25% Marcaine with epinephrine was injected into the suprapatellar pouch. The incisions are cleaned and dried and a bulky sterile dressing is applied. The patient is then awakened and transported to recovery in stable condition.   12/04/2016, 10:54 AM

## 2016-12-04 NOTE — Discharge Instructions (Signed)
° °Dr. Brian Swinteck °Hip & Knee Reconstruction °[facility] °[address] °[place], [place] [address] °[phone] ° ° °Arthroscopic Procedure, Knee °An arthroscopic procedure can find what is wrong with your knee. °PROCEDURE °Arthroscopy is a surgical technique that allows your orthopedic surgeon to diagnose and treat your knee injury with accuracy. They will look into your knee through a small instrument. This is almost like a small (pencil sized) telescope. Because arthroscopy affects your knee less than open knee surgery, you can anticipate a more rapid recovery. Taking an active role by following your caregiver's instructions will help with rapid and complete recovery. Use crutches, rest, elevation, ice, and knee exercises as instructed. The length of recovery depends on various factors including type of injury, age, physical condition, medical conditions, and your rehabilitation. °Your knee is the joint between the large bones (femur and tibia) in your leg. Cartilage covers these bone ends which are smooth and slippery and allow your knee to bend and move smoothly. Two menisci, thick, semi-lunar shaped pads of cartilage which form a rim inside the joint, help absorb shock and stabilize your knee. Ligaments bind the bones together and support your knee joint. Muscles move the joint, help support your knee, and take stress off the joint itself. Because of this all programs and physical therapy to rehabilitate an injured or repaired knee require rebuilding and strengthening your muscles. °AFTER THE PROCEDURE °· After the procedure, you will be moved to a recovery area until most of the effects of the medication have worn off. Your caregiver will discuss the test results with you.  °· Only take over-the-counter or prescription medicines for pain, discomfort, or fever as directed by your caregiver.  °SEEK MEDICAL CARE IF:  °· You have increased bleeding from your wounds.  °· You see  redness, swelling, or have increasing pain in your wounds.  °· You have pus coming from your wound.  °· You have an oral temperature above 102° F (38.9° C).  °· You notice a bad smell coming from the wound or dressing.  °· You have severe pain with any motion of your knee.  °SEEK IMMEDIATE MEDICAL CARE IF:  °· You develop a rash.  °· You have difficulty breathing.  °· You have any allergic problems.  °FURTHER INSTRUCTIONS:  °· ICE to the affected knee every three hours for 30 minutes at a time and then as needed for pain and swelling.  Continue to use ice on the knee for pain and swelling from surgery. You may notice swelling that will progress down to the foot and ankle.  This is normal after surgery.  Elevate the leg when you are not up walking on it.   ° °DIET °You may resume your previous home diet once your are discharged from the hospital. ° °DRESSING / WOUND CARE / SHOWERING °You may change your dressing 3-5 days after surgery.  Then change the dressing every day with sterile gauze.  Please use good hand washing techniques before changing the dressing.  Do not use any lotions or creams on the incision until instructed by your surgeon. °You may start showering two days after being discharged home but do not submerge the incisions under water.  °Change dressing 48 hours after the procedure and then cover the small incisions with band aids until your follow up visit. °Change the surgical dressings daily and reapply a dry dressing each time.  ° °ACTIVITY °Walk with your walker as instructed. °Use walker as long   as suggested by your caregivers. °Avoid periods of inactivity such as sitting longer than an hour when not asleep. This helps prevent blood clots.  °You may resume a sexual relationship in one month or when given the OK by your doctor.  °You may return to work once you are cleared by your doctor.  °Do not drive a car for 6 weeks or until released by you surgeon.  °Do not drive while taking  narcotics. ° °WEIGHT BEARING °Weight bearing as tolerated with assist device (walker, cane, etc) as directed, use it as long as suggested by your surgeon or therapist, typically at least 4-6 weeks. ° °POSTOPERATIVE CONSTIPATION PROTOCOL °Constipation - defined medically as fewer than three stools per week and severe constipation as less than one stool per week. ° °One of the most common issues patients have following surgery is constipation.  Even if you have a regular bowel pattern at home, your normal regimen is likely to be disrupted due to multiple reasons following surgery.  Combination of anesthesia, postoperative narcotics, change in appetite and fluid intake all can affect your bowels.  In order to avoid complications following surgery, here are some recommendations in order to help you during your recovery period. ° °Colace (docusate) - Pick up an over-the-counter form of Colace or another stool softener and take twice a day as long as you are requiring postoperative pain medications.  Take with a full glass of water daily.  If you experience loose stools or diarrhea, hold the colace until you stool forms back up.  If your symptoms do not get better within 1 week or if they get worse, check with your doctor. ° °Dulcolax (bisacodyl) - Pick up over-the-counter and take as directed by the product packaging as needed to assist with the movement of your bowels.  Take with a full glass of water.  Use this product as needed if not relieved by Colace only.  ° °MiraLax (polyethylene glycol) - Pick up over-the-counter to have on hand.  MiraLax is a solution that will increase the amount of water in your bowels to assist with bowel movements.  Take as directed and can mix with a glass of water, juice, soda, coffee, or tea.  Take if you go more than two days without a movement. °Do not use MiraLax more than once per day. Call your doctor if you are still constipated or irregular after using this medication for 7 days  in a row. ° °If you continue to have problems with postoperative constipation, please contact the office for further assistance and recommendations.  If you experience "the worst abdominal pain ever" or develop nausea or vomiting, please contact the office immediatly for further recommendations for treatment. ° °ITCHING ° If you experience itching with your medications, try taking only a single pain pill, or even half a pain pill at a time.  You can also use Benadryl over the counter for itching or also to help with sleep.  ° °TED HOSE STOCKINGS °Wear the elastic stockings on both legs for three weeks following surgery during the day but you may remove then at night for sleeping. ° °MEDICATIONS °See your medication summary on the “After Visit Summary” that the nursing staff will review with you prior to discharge.  You may have some home medications which will be placed on hold until you complete the course of blood thinner medication.  It is important for you to complete the blood thinner medication as prescribed by your surgeon.  Continue   your approved medications as instructed at time of discharge. °Do not drive while taking narcotics.  ° °PRECAUTIONS °If you experience chest pain or shortness of breath - call 911 immediately for transfer to the hospital emergency department.  °If you develop a fever greater that 101 F, purulent drainage from wound, increased redness or drainage from wound, foul odor from the wound/dressing, or calf pain - CONTACT YOUR SURGEON.   °                                                °FOLLOW-UP APPOINTMENTS °Make sure you keep all of your appointments after your operation with your surgeon and caregivers. You should call the office at [phone]  and make an appointment for approximately one week after the date of your surgery or on the date instructed by your surgeon outlined in the "After Visit Summary". ° °RANGE OF MOTION AND STRENGTHENING EXERCISES  °Rehabilitation of the knee  is important following a knee injury or an operation. After just a few days of immobilization, the muscles of the thigh which control the knee become weakened and shrink (atrophy). Knee exercises are designed to build up the tone and strength of the thigh muscles and to improve knee motion. Often times heat used for twenty to thirty minutes before working out will loosen up your tissues and help with improving the range of motion but do not use heat for the first two weeks following surgery. These exercises can be done on a training (exercise) mat, on the floor, on a table or on a bed. Use what ever works the best and is most comfortable for you Knee exercises include: ° °QUAD STRENGTHENING EXERCISES °Strengthening Quadriceps Sets ° °Tighten muscles on top of thigh by pushing knees down into floor or table. °Hold for 20 seconds. Repeat 10 times. °Do 2 sessions per day. ° ° ° ° °Strengthening Terminal Knee Extension ° °With knee bent over bolster, straighten knee by tightening muscle on top of thigh. Be sure to keep bottom of knee on bolster. °Hold for 20 seconds. Repeat 10 times. °Do 2 sessions per day. ° ° °Straight Leg with Bent Knee ° °Lie on back with opposite leg bent. Keep involved knee slightly bent at knee and raise leg 4-6". Hold for 10 seconds. °Repeat 20 times per set. °Do 2 sets per session. °Do 2 sessions per day. ° ° °Post Anesthesia Home Care Instructions ° °Activity: °Get plenty of rest for the remainder of the day. A responsible adult should stay with you for 24 hours following the procedure.  °For the next 24 hours, DO NOT: °-Drive a car °-Operate machinery °-Drink alcoholic beverages °-Take any medication unless instructed by your physician °-Make any legal decisions or sign important papers. ° °Meals: °Start with liquid foods such as gelatin or soup. Progress to regular foods as tolerated. Avoid greasy, spicy, heavy foods. If nausea and/or vomiting occur, drink only clear liquids until the nausea  and/or vomiting subsides. Call your physician if vomiting continues. ° °Special Instructions/Symptoms: °Your throat may feel dry or sore from the anesthesia or the breathing tube placed in your throat during surgery. If this causes discomfort, gargle with warm salt water. The discomfort should disappear within 24 hours. ° °If you had a scopolamine patch placed behind your ear for the management of post- operative nausea and/or vomiting: ° °  1. The medication in the patch is effective for 72 hours, after which it should be removed.  Wrap patch in a tissue and discard in the trash. Wash hands thoroughly with soap and water. °2. You may remove the patch earlier than 72 hours if you experience unpleasant side effects which may include dry mouth, dizziness or visual disturbances. °3. Avoid touching the patch. Wash your hands with soap and water after contact with the patch. °  ° °

## 2016-12-07 ENCOUNTER — Encounter (HOSPITAL_BASED_OUTPATIENT_CLINIC_OR_DEPARTMENT_OTHER): Payer: Self-pay | Admitting: Orthopedic Surgery

## 2017-06-15 ENCOUNTER — Encounter: Payer: Self-pay | Admitting: Family

## 2017-06-15 ENCOUNTER — Ambulatory Visit (INDEPENDENT_AMBULATORY_CARE_PROVIDER_SITE_OTHER): Payer: 59 | Admitting: Family

## 2017-06-15 VITALS — BP 119/72 | HR 74 | Temp 98.1°F | Ht 64.0 in | Wt 219.8 lb

## 2017-06-15 DIAGNOSIS — K580 Irritable bowel syndrome with diarrhea: Secondary | ICD-10-CM

## 2017-06-15 DIAGNOSIS — I10 Essential (primary) hypertension: Secondary | ICD-10-CM

## 2017-06-15 DIAGNOSIS — E785 Hyperlipidemia, unspecified: Secondary | ICD-10-CM | POA: Diagnosis not present

## 2017-06-15 DIAGNOSIS — Z1159 Encounter for screening for other viral diseases: Secondary | ICD-10-CM | POA: Diagnosis not present

## 2017-06-15 DIAGNOSIS — K2100 Gastro-esophageal reflux disease with esophagitis, without bleeding: Secondary | ICD-10-CM

## 2017-06-15 DIAGNOSIS — K219 Gastro-esophageal reflux disease without esophagitis: Secondary | ICD-10-CM | POA: Insufficient documentation

## 2017-06-15 DIAGNOSIS — K589 Irritable bowel syndrome without diarrhea: Secondary | ICD-10-CM | POA: Insufficient documentation

## 2017-06-15 NOTE — Progress Notes (Signed)
Subjective:    Patient ID: Dawn Moon, female    DOB: 07/05/1956, 61 y.o.   MRN: [id number]  Pt presents to the office today establish care. Pt is followed by GI every 6 months for IBS. Pt has ventricular tachycardia and has seen Cardiologists. Currently taking atenolol 50 mg daily.  Hypertension  This is a chronic problem. The current episode started more than 1 year ago. The problem has been resolved since onset. The problem is controlled. Associated symptoms include peripheral edema. Pertinent negatives include no headaches. Risk factors for coronary artery disease include dyslipidemia, obesity and sedentary lifestyle. Past treatments include beta blockers. The current treatment provides moderate improvement. There is no history of CAD/MI, CVA or heart failure.  Gastroesophageal Reflux  She reports no belching or no heartburn. This is a chronic problem. The current episode started more than 1 year ago. The problem occurs occasionally. The problem has been gradually improving. She has tried a PPI for the symptoms. The treatment provided moderate relief.  Hyperlipidemia  This is a chronic problem. The current episode started more than 1 year ago. The problem is uncontrolled. Recent lipid tests were reviewed and are high. Exacerbating diseases include obesity. Current antihyperlipidemic treatment includes diet change. The current treatment provides no improvement of lipids. Risk factors for coronary artery disease include dyslipidemia, hypertension, obesity, post-menopausal, a sedentary lifestyle and family history.  [place] Edema Pt currently taking lasix. Wears compresses hose bilaterally.     Review of Systems  Cardiovascular: Positive for leg swelling.  Gastrointestinal: Negative for heartburn.  Neurological: Negative for headaches.  All other systems reviewed and are negative.  Social History   Social History  . Marital status: Widowed    Spouse name: N/A  . Number of  children: N/A  . Years of education: N/A   Social History Main Topics  . Smoking status: Never Smoker  . Smokeless tobacco: Never Used  . Alcohol use Yes     Comment: Rare  . Drug use: No  . Sexual activity: Not Asked   Other Topics Concern  . None   Social History Narrative   Does not routinely exercise.  Has to walk up stairs to work and can do w/o Ss.  Lives @ home in [place] with family.   Family History  Problem Relation Age of Onset  . Lymphoma Mother        non-hodgkins - died @ 93  . Coronary artery disease Mother   . Coronary artery disease Father        CABG in his 62s, died @ 72       Objective:   Physical Exam  Constitutional: She is oriented to person, place, and time. She appears well-developed and well-nourished. No distress.  HENT:  Head: Normocephalic and atraumatic.  Right Ear: External ear normal.  Left Ear: External ear normal.  Nose: Nose normal.  Mouth/Throat: Oropharynx is clear and moist.  Eyes: Pupils are equal, round, and reactive to light.  Neck: Normal range of motion. Neck supple. No thyromegaly present.  Cardiovascular: Normal rate, regular rhythm, normal heart sounds and intact distal pulses.   No murmur heard. Pulmonary/Chest: Effort normal and breath sounds normal. No respiratory distress. She has no wheezes.  Abdominal: Soft. Bowel sounds are normal. She exhibits no distension. There is no tenderness.  Musculoskeletal: Normal range of motion. She exhibits edema (2+ BLE). She exhibits no tenderness.  Neurological: She is alert and oriented to person, place, and time.  Skin: Skin  is warm and dry.  Psychiatric: She has a normal mood and affect. Her behavior is normal. Judgment and thought content normal.  Vitals reviewed.     BP 119/72   Pulse 74   Temp 98.1 F (36.7 C) (Oral)   Ht _0  (1.626 m)   Wt 219 lb 12.8 oz (99.7 kg)   BMI 37.73 kg/m       Assessment & Plan:  1. Essential hypertension - CMP14+EGFR - CBC with  Differential/Platelet  2. Hyperlipidemia, unspecified hyperlipidemia type - CMP14+EGFR - Lipid panel - CBC with Differential/Platelet  3. Irritable bowel syndrome with diarrhea - CMP14+EGFR - CBC with Differential/Platelet  4. Gastroesophageal reflux disease with esophagitis - CMP14+EGFR - CBC with Differential/Platelet  5. Morbid obesity ([place]) - CMP14+EGFR - CBC with Differential/Platelet  6. Need for hepatitis C screening test - Hepatitis C antibody - CBC with Differential/Platelet   Continue all meds Labs pending Health Maintenance reviewed Diet and exercise encouraged RTO 6 months   Evelina Dun, FNP

## 2017-06-15 NOTE — Patient Instructions (Signed)
Peripheral Edema Peripheral edema is swelling that is caused by a buildup of fluid. Peripheral edema most often affects the lower legs, ankles, and feet. It can also develop in the arms, hands, and face. The area of the body that has peripheral edema will look swollen. It may also feel heavy or warm. Your clothes may start to feel tight. Pressing on the area may make a temporary dent in your skin. You may not be able to move your arm or leg as much as usual. There are many causes of peripheral edema. It can be a complication of other diseases, such as congestive heart failure, kidney disease, or a problem with your blood circulation. It also can be a side effect of certain medicines. It often happens to women during pregnancy. Sometimes, the cause is not known. Treating the underlying condition is often the only treatment for peripheral edema. Follow these instructions at home: Pay attention to any changes in your symptoms. Take these actions to help with your discomfort:  Raise (elevate) your legs while you are sitting or lying down.  Move around often to prevent stiffness and to lessen swelling. Do not sit or stand for long periods of time.  Wear support stockings as told by your health care provider.  Follow instructions from your health care provider about limiting salt (sodium) in your diet. Sometimes eating less salt can reduce swelling.  Take over-the-counter and prescription medicines only as told by your health care provider. Your health care provider may prescribe medicine to help your body get rid of excess water (diuretic).  Keep all follow-up visits as told by your health care provider. This is important.  Contact a health care provider if:  You have a fever.  Your edema starts suddenly or is getting worse, especially if you are pregnant or have a medical condition.  You have swelling in only one leg.  You have increased swelling and pain in your legs. Get help right away  if:  You develop shortness of breath, especially when you are lying down.  You have pain in your chest or abdomen.  You feel weak.  You faint. This information is not intended to replace advice given to you by your health care provider. Make sure you discuss any questions you have with your health care provider. Document Released: 01/14/2005 Document Revised: 05/11/2016 Document Reviewed: 06/19/2015 Elsevier Interactive Patient Education  [employer].

## 2017-06-16 LAB — CBC WITH DIFFERENTIAL/PLATELET
BASOS: 1 %
Basophils Absolute: 0 10*3/uL (ref 0.0–0.2)
EOS (ABSOLUTE): 0.1 10*3/uL (ref 0.0–0.4)
EOS: 2 %
HEMATOCRIT: 41.8 % (ref 34.0–46.6)
Hemoglobin: 13.6 g/dL (ref 11.1–15.9)
Immature Grans (Abs): 0 10*3/uL (ref 0.0–0.1)
Immature Granulocytes: 0 %
LYMPHS ABS: 1.8 10*3/uL (ref 0.7–3.1)
Lymphs: 33 %
MCH: 29.1 pg (ref 26.6–33.0)
MCHC: 32.5 g/dL (ref 31.5–35.7)
MCV: 89 fL (ref 79–97)
Monocytes Absolute: 0.4 10*3/uL (ref 0.1–0.9)
Monocytes: 7 %
Neutrophils Absolute: 3.1 10*3/uL (ref 1.4–7.0)
Neutrophils: 57 %
Platelets: 143 10*3/uL — ABNORMAL LOW (ref 150–379)
RBC: 4.68 x10E6/uL (ref 3.77–5.28)
RDW: 16 % — AB (ref 12.3–15.4)
WBC: 5.3 10*3/uL (ref 3.4–10.8)

## 2017-06-16 LAB — LIPID PANEL
CHOL/HDL RATIO: 4 ratio (ref 0.0–4.4)
CHOLESTEROL TOTAL: 200 mg/dL — AB (ref 100–199)
HDL: 50 mg/dL (ref >39)
LDL CALC: 112 mg/dL — AB (ref 0–99)
Triglycerides: 192 mg/dL — ABNORMAL HIGH (ref 0–149)
VLDL CHOLESTEROL CAL: 38 mg/dL (ref 5–40)

## 2017-06-16 LAB — CMP14+EGFR
ALBUMIN: 3.6 g/dL (ref 3.6–4.8)
ALK PHOS: 103 IU/L (ref 39–117)
ALT: 26 IU/L (ref 0–32)
AST: 51 IU/L — ABNORMAL HIGH (ref 0–40)
Albumin/Globulin Ratio: 1.1 — ABNORMAL LOW (ref 1.2–2.2)
BUN / CREAT RATIO: 9 — AB (ref 12–28)
BUN: 6 mg/dL — AB (ref 8–27)
Bilirubin Total: 1.1 mg/dL (ref 0.0–1.2)
CALCIUM: 9 mg/dL (ref 8.7–10.3)
CO2: 26 mmol/L (ref 20–29)
CREATININE: 0.68 mg/dL (ref 0.57–1.00)
Chloride: 105 mmol/L (ref 96–106)
GFR calc Af Amer: 110 mL/min/{1.73_m2} (ref >59)
GFR, EST NON AFRICAN AMERICAN: 95 mL/min/{1.73_m2} (ref >59)
GLUCOSE: 101 mg/dL — AB (ref 65–99)
Globulin, Total: 3.3 g/dL (ref 1.5–4.5)
Potassium: 4 mmol/L (ref 3.5–5.2)
Sodium: 144 mmol/L (ref 134–144)
TOTAL PROTEIN: 6.9 g/dL (ref 6.0–8.5)

## 2017-06-16 LAB — HEPATITIS C ANTIBODY: Hep C Virus Ab: <0.1 s/co ratio (ref 0.0–0.9)

## 2017-11-29 ENCOUNTER — Encounter: Payer: Self-pay | Admitting: *Deleted

## 2017-11-30 ENCOUNTER — Ambulatory Visit: Payer: 59 | Admitting: Family

## 2017-12-03 ENCOUNTER — Ambulatory Visit: Payer: 59 | Admitting: Family

## 2017-12-10 ENCOUNTER — Encounter: Payer: Self-pay | Admitting: Family

## 2017-12-10 ENCOUNTER — Ambulatory Visit (INDEPENDENT_AMBULATORY_CARE_PROVIDER_SITE_OTHER): Payer: 59 | Admitting: Family

## 2017-12-10 VITALS — BP 132/83 | HR 94 | Temp 97.6°F | Ht 64.0 in | Wt 224.0 lb

## 2017-12-10 DIAGNOSIS — K2100 Gastro-esophageal reflux disease with esophagitis, without bleeding: Secondary | ICD-10-CM

## 2017-12-10 DIAGNOSIS — K580 Irritable bowel syndrome with diarrhea: Secondary | ICD-10-CM | POA: Diagnosis not present

## 2017-12-10 DIAGNOSIS — E785 Hyperlipidemia, unspecified: Secondary | ICD-10-CM

## 2017-12-10 DIAGNOSIS — I10 Essential (primary) hypertension: Secondary | ICD-10-CM

## 2017-12-10 DIAGNOSIS — I472 Ventricular tachycardia: Secondary | ICD-10-CM | POA: Diagnosis not present

## 2017-12-10 DIAGNOSIS — I4729 Other ventricular tachycardia: Secondary | ICD-10-CM

## 2017-12-10 MED ORDER — ATENOLOL 50 MG PO TABS: 50.0000 mg | ORAL_TABLET | Freq: Every morning | ORAL | 1 refills | Status: DC

## 2017-12-10 MED ORDER — FUROSEMIDE 20 MG PO TABS: 20.0000 mg | ORAL_TABLET | Freq: Every morning | ORAL | 1 refills | Status: DC

## 2017-12-10 MED ORDER — PANTOPRAZOLE SODIUM 40 MG PO TBEC: 40.0000 mg | DELAYED_RELEASE_TABLET | Freq: Every morning | ORAL | 1 refills | Status: DC

## 2017-12-10 MED ORDER — POTASSIUM CITRATE ER 10 MEQ (1080 MG) PO TBCR
10.0000 meq | EXTENDED_RELEASE_TABLET | Freq: Two times a day (BID) | ORAL | 1 refills | Status: DC
Start: 2017-12-10 — End: 2018-04-22

## 2017-12-10 MED ORDER — ELUXADOLINE 75 MG PO TABS: 75.0000 mg | ORAL_TABLET | Freq: Two times a day (BID) | ORAL | 5 refills | Status: DC

## 2017-12-10 NOTE — Patient Instructions (Signed)
Preparing for Knee Replacement Getting prepared before knee replacement surgery can make your recovery easier and more comfortable. This document provides some tips and guidelines that will help you prepare for your surgery. You can ease any concerns about your financial responsibilities by calling your insurance company after you decide to have surgery. In addition to asking about your surgery and hospital stay, you will want to ask about coverage for medical equipment, rehabilitation facilities, and home care. How should I arrange for help? You will be stronger and more mobile every day. However, in the first couple of weeks after surgery, it is unlikely that you will be able to do all your daily activities as easily as you did before your surgery. You may tire easily and will still have limited movement in your leg. Follow these guidelines to best arrange for the help you may need after your surgery:  Plan to have someone take you home after the procedure. Your health care provider will tell you how many days you can expect to be in the hospital.  Cancel all work, caregiving, and volunteer responsibilities for at least 4-6 weeks after your surgery.  If you live alone, arrange for someone to take care of your home and pets for the first 4-6 weeks after surgery.  Plan to have someone stay with you day and night for the first week. This person should be someone you are comfortable with. You may need this person to help you with your exercises and personal care, such as bathing and using the toilet.  Arrange for drivers to take you to and from your follow-up appointments, the grocery store, and other places you may need to go for at least 4-6 weeks.  How should I prepare my home?  Pick a recovery spot, but do not plan on recovering in bed. Sitting upright is better for your health. You may want to use a recliner with a small table nearby. Place the items you use most frequently on that table. These  may include the TV remote, a cordless phone, a cell phone, a book or laptop computer, a water glass, and any other items of your choice.  Remove all clutter from your floors. Also remove any throw rugs.  To see if you will be able to move in your home with a walker, hold your hands out about 6 inches (15 cm) from your sides. Walk from your recovery spot to your kitchen and bathroom. Then walk from your bed to the bathroom. If you do not hit anything with your hands, you'll know that you have enough room for a walker.  Move the items that you use most often from your kitchen, bathroom, and bedroom to shelves and drawers that are at countertop height.  Prepare a few meals to freeze and reheat later.  Consider adding grab bars in the shower and near the toilet.  While you are in the hospital, you will learn about equipment that can be helpful for your recovery. Equipment may include raised toilet seats, tub benches, and shower benches. How should I prepare my body?  Have a preoperative exam. ? During the exam, your health care provider will make sure that your body is healthy enough to safely have this surgery. ? To your exam, take a complete list of all your medicines and supplements, including herbs and vitamins. ? You may need to have additional tests to ensure your safety.  Have elective dental care and routine cleanings completed before your surgery. Germs  from anywhere in your body, including your mouth, can travel to your new joint and infect it. It is important that you do not have any dental work performed for at least 3 months after your surgery. After surgery, be sure to tell your dentist about your joint replacement.  Maintain a healthy diet. Do not change your diet before surgery unless your health care provider advised you to do that.  Do not use any products that contain nicotine or tobacco, such as cigarettes or e-cigarettes. Tobacco and nicotine can delay bone healing. If you  need help quitting, ask your health care provider.  The day before your surgery, follow instructions from your health care provider for showering, eating, drinking, and taking medicines. These directions are for your safety. What kinds of exercises should I do? Your health care provider may have you do the following exercises before your surgery. Be sure to follow the exercise program only as directed by your health care provider. You should not feel pain while performing these exercises. Ankle pumps ( Dorsiflexion/plantar flexion) 1. Sit on a firm surface with your __________ leg straight out in front of you. Do not rest your foot on anything. 2. Move your foot at the ankle joint to tilt the top of your foot toward your shin. 3. Hold this position for __________ seconds. 4. Reverse the motion by tilting the top of your foot away from your shin. 5. Hold this position for __________ seconds. Repeat __________ times, then do the exercise with your other leg. Complete this exercise __________ times a day. Heel slides ( Knee flexion) 1. Lie on your back with both knees straight. (If this causes back discomfort, bend your healthy knee so your foot is flat on the floor. Keep this leg in this position while doing heel slides with the other leg.) 2. Slowly slide your __________ heel back toward your buttocks until you feel a gentle stretch in the front of your knee or thigh. 3. Hold this position for __________ seconds. 4. Slowly slide your __________ heel to the starting position. Repeat __________ times, then do the exercise with your other leg. Complete this exercise __________ times a day. Quadriceps, isometric 1. Lie on your back with your __________ leg extended and your other knee bent. 2. Gradually tighten the muscles in the front of your __________ thigh. This motion will push the back of your knee down toward the surface that is under it. 3. For __________ seconds, keep the muscles as tight  as you can without causing or increasing pain. 4. Relax the muscles slowly and completely after each repetition. Repeat __________ times, then do the exercise with your other leg. Complete this exercise __________ times a day. Short arc kicks 1. Lie on your back. Place a rolled towel (about 4-6 inches [10-15 cm] in diameter) under one knee so the knee is slightly bent. 2. Tighten the muscles in the front of your __________ thigh to raise only the lower part of your slightly bent leg off the floor. Do not allow your thigh to rise. 3. Hold this position for __________ seconds. Repeat __________ times, then do the exercise with your other leg. Complete this exercise __________ times a day. Straight leg raises - quadriceps 1. Lie on your back with your __________ leg extended and your other knee bent. 2. Tighten the muscles in the front of your __________ thigh. Your thigh may shake slightly. 3. Tighten these muscles even more to raise your leg 4-6 inches (10-15 cm)  off the floor. 4. Hold this position for __________ seconds. 5. Keep these muscles tight as you lower your leg. 6. Relax the muscles slowly and completely after each repetition. Repeat __________ times, then do the exercise with your other leg. Complete this exercise __________ times a day. Hamstring curls, prone If told by your health care provider, do this exercise while wearing ankle weights. Begin with __________ weights. Then increase the weight by 1 lb (0.5 kg) increments. Do not wear ankle weights that are more than __________. 1. Lie on your abdomen with your legs straight. [place] your __________ knee as far as you can comfortably do that. Keep your hips flat against the surface that is under them. When you bend your knee, bring your foot straight toward your buttock. Do not let it fall in or out. 3. Hold this position for __________ seconds. 4. Slowly lower your leg to the starting position.  Repeat __________ times, then do  the exercise with your other leg. Complete this exercise __________ times a day. Armchair push-ups 1. Find a firm, non-wheeled chair that has solid armrests. 2. Sitting in the chair, extend your __________ leg straight out in front of you. 3. Use your arms and your other leg to lift your body weight off of the seat of the chair. 4. Slowly lower your body weight to the seat. Repeat __________ times, then do the exercise with your other leg. Complete this exercise __________ times a day. This information is not intended to replace advice given to you by your health care provider. Make sure you discuss any questions you have with your health care provider. Document Released: 03/13/2011 Document Revised: 01/09/2017 Document Reviewed: 03/01/2014 Elsevier Interactive Patient Education  [employer].

## 2017-12-10 NOTE — Progress Notes (Signed)
Subjective:    Patient ID: Dawn Moon, female    DOB: February 24, 1956, 61 y.o.   MRN: [id number]  Pt presents to the office today chronic follow up. Pt is followed by GI every annually for IBS. States she sometimes is having constipation and would like to try decreasing her viberzi.  Pt has ventricular tachycardia and has seen Cardiologists. Currently taking atenolol 50 mg daily.   Pt having right knee pain and is followed by Ortho. States she is planning on having knee replacement in March.  Hypertension  This is a chronic problem. The current episode started more than 1 year ago. The problem has been resolved since onset. The problem is controlled. Associated symptoms include peripheral edema. Pertinent negatives include no headaches or shortness of breath. Risk factors for coronary artery disease include dyslipidemia, obesity and sedentary lifestyle. The current treatment provides moderate improvement. There is no history of kidney disease or CAD/MI.  Gastroesophageal Reflux  She reports no belching, no coughing or no heartburn. This is a chronic problem. The current episode started more than 1 year ago. The problem occurs occasionally. The problem has been waxing and waning. She has tried a PPI for the symptoms. The treatment provided moderate relief.  Hyperlipidemia  This is a chronic problem. The current episode started more than 1 year ago. The problem is uncontrolled. Recent lipid tests were reviewed and are high. Exacerbating diseases include obesity. Pertinent negatives include no shortness of breath. Current antihyperlipidemic treatment includes statins. The current treatment provides moderate improvement of lipids. Risk factors for coronary artery disease include dyslipidemia, hypertension, obesity and post-menopausal.  Arthritis  Presents for follow-up visit. She complains of pain and stiffness. Affected locations include the right knee. Her pain is at a severity of 8/10.       Review of Systems  Respiratory: Negative for cough and shortness of breath.   Gastrointestinal: Negative for heartburn.  Musculoskeletal: Positive for arthralgias, arthritis and stiffness.  Neurological: Negative for headaches.  All other systems reviewed and are negative.      Objective:   Physical Exam  Constitutional: She is oriented to person, place, and time. She appears well-developed and well-nourished. No distress.  HENT:  Head: Normocephalic and atraumatic.  Right Ear: External ear normal.  Mouth/Throat: Oropharynx is clear and moist.  Eyes: Pupils are equal, round, and reactive to light.  Neck: Normal range of motion. Neck supple. No thyromegaly present.  Cardiovascular: Normal rate, regular rhythm, normal heart sounds and intact distal pulses.  No murmur heard. Pulmonary/Chest: Effort normal. No respiratory distress. She has decreased breath sounds. She has no wheezes.  Abdominal: Soft. Bowel sounds are normal. She exhibits no distension. There is no tenderness.  Musculoskeletal: Normal range of motion. She exhibits edema (trace BLE) and tenderness (with flexion and extension).  Neurological: She is alert and oriented to person, place, and time.  Skin: Skin is warm and dry.  Psychiatric: She has a normal mood and affect. Her behavior is normal. Judgment and thought content normal.  Vitals reviewed.    BP 132/83   Pulse 94   Temp 97.6 F (36.4 C) (Oral)   Ht 5' 4" (1.626 m)   Wt 224 lb (101.6 kg)   BMI 38.45 kg/m      Assessment & Plan:  1. Essential hypertension - furosemide (LASIX) 20 MG tablet; Take 1 tablet (20 mg total) by mouth every morning. Patient taking up to TID prn  Dispense: 90 tablet; Refill: 1 - atenolol (TENORMIN)  50 MG tablet; Take 1 tablet (50 mg total) by mouth every morning.  Dispense: 90 tablet; Refill: 1 - CMP14+EGFR  2. Gastroesophageal reflux disease with esophagitis - pantoprazole (PROTONIX) 40 MG tablet; Take 1 tablet (40 mg  total) by mouth every morning.  Dispense: 90 tablet; Refill: 1 - CMP14+EGFR  3. Hyperlipidemia, unspecified hyperlipidemia type  - CMP14+EGFR - Lipid panel  4. Morbid obesity ([place]) - CMP14+EGFR  5. Irritable bowel syndrome with diarrhea Will decrease Viberzi to 75 mg today - Eluxadoline (VIBERZI) 75 MG TABS; Take 75 mg by mouth 2 (two) times daily.  Dispense: 60 tablet; Refill: 5 - CMP14+EGFR  6. NSVT (nonsustained ventricular tachycardia) ([place])  - CMP14+EGFR   Continue all meds Labs pending Health Maintenance reviewed Diet and exercise encouraged RTO 6 months   Evelina Dun, FNP

## 2017-12-15 ENCOUNTER — Telehealth: Payer: Self-pay | Admitting: Physician Assistant

## 2017-12-15 NOTE — Telephone Encounter (Signed)
° °  [facility] Pre-operative Risk Assessment    1. Close encounter

## 2017-12-17 ENCOUNTER — Ambulatory Visit: Payer: 59 | Admitting: Family

## 2018-01-06 ENCOUNTER — Ambulatory Visit (INDEPENDENT_AMBULATORY_CARE_PROVIDER_SITE_OTHER): Payer: 59 | Admitting: Cardiology

## 2018-01-06 ENCOUNTER — Encounter: Payer: Self-pay | Admitting: Cardiology

## 2018-01-06 VITALS — BP 126/84 | HR 74 | Ht 64.0 in | Wt 217.0 lb

## 2018-01-06 DIAGNOSIS — Z01818 Encounter for other preprocedural examination: Secondary | ICD-10-CM | POA: Diagnosis not present

## 2018-01-06 NOTE — Patient Instructions (Signed)
Medication Instructions:  Your physician recommends that you continue on your current medications as directed. Please refer to the Current Medication list given to you today.   Labwork: None ordered  Testing/Procedures: None ordered  Follow-Up: Your physician recommends that you schedule a follow-up appointment in: AS NEEDED   Any Other Special Instructions Will Be Listed Below (If Applicable).     If you need a refill on your cardiac medications before your next appointment, please call your pharmacy.

## 2018-01-06 NOTE — Progress Notes (Signed)
01/06/2018 Dawn Moon   February 24, 1956  [id number]  Primary Physician Sharion Balloon, FNP Primary Cardiologist: Dr. Stanford Breed  Electrophysiologist: Dr. Caryl Comes  Reason for Visit/CC: Preoperative cardiac evaluation for right TKR  HPI:  Dawn Moon is a 62 y.o. female who is being seen today for  evaluation for preoperative cardiac evaluation prior to Rt TKR, at the request of Dr. Lyla Glassing, [place].   She was first evaluated in 2011 for palpitations. Outpatient cardiac monitor showed 4 beats of nonsustained ventricular tachycardia; also brief atrial tachycardia. TSH and K were normal. Pt placed on BB therapy and has tolerated well w/o any significant recurrence.  She has also undergone w/u to evaluate for CAD. In 2013, she had a cardiac cath which showed no obstructive coronary disease. LVEF was normal. Echocardiogram in January of 2013 showed normal LV function, mild left ventricular hypertrophy, and trace tricuspid regurgitation. In 2015, she had a coronary CTA for chest pain. 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 Medical management recommended.   She was last seen in 2017 for preoperative evaluation prior to orthoscopic knee surgery. She denied cardiac symptoms and was cleared for surgery. She did well w/o any perioperative issues.   She now presents back as she is in need for total TKR. She continues to deny any cardiac symptoms. Last month she went to [facility] and was able to walk several blocks (had just received steroid injection) and did not experience and exertional chest pain. She also denies palpitations, dizziness, syncope/ near syncope. She has mild chronic exertional dyspnea, which she relates to being deconditioned.   EKG today shows NSR. No ischemic abnormalities. CV exam is benign, w/o murmurs or carotid bruits. BP and HR both well controlled.   According to the Revised Cardiac Risk Index (RCRI), her Perioperative Risk of Major  Cardiac Event is (%): 0.4   RCRI High Risk Surgery: No  History of Ischemic Heart Disease: No  H/o CHF: No  H/o Cerebrovascular disease: No Pre-op Treatment w/ Insulin: No Pre-Operative Creatine > 2: No   Cardiac Studies:  Coronary CTA 2015 FINDINGS: Coronary Arteries: Originating in a normal position. There is right dominance.  Left main:  No plague.  Gives rise to LAD and LCX.  LAD is a large caliber vessel. LAD gives rise to a large diagonal branch. There is minimal calcified plague in the proximal LAD associated with 0-25% stenosis. The mid and distal LAD only have luminal irregularities.  The first diagonal branch has no significant plague.  LCX is a non-dominant vessel that gives rise to a medium size obtuse marginal branch. There is no obvious plague in the LCX territory.  RCA is a large dominant vessel. There is motion in the mid RCA, however only luminal irregularities are noted. RCA gives rise to both PDA and PLVB, both free of significant plague.  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 Medical management is recommended.    Current Meds  Medication Sig  . atenolol (TENORMIN) 50 MG tablet Take 1 tablet (50 mg total) by mouth every morning.  . Butalbital-APAP-Caffeine 50-300-40 MG CAPS Take by mouth.  . Cyanocobalamin (VITAMIN B-12 PO) Take by mouth daily.  . Eluxadoline (VIBERZI) 100 MG TABS Take 1 tablet by mouth 2 (two) times daily.   . Eluxadoline (VIBERZI) 75 MG TABS Take 75 mg by mouth 2 (two) times daily.  . furosemide (LASIX) 20 MG tablet Take 1 tablet (20 mg total) by mouth every morning. Patient taking up  to TID prn  . HYDROcodone-acetaminophen (NORCO) 5-325 MG tablet Take 1-2 tablets by mouth every 4 (four) hours as needed for moderate pain.  . pantoprazole (PROTONIX) 40 MG tablet Take 1 tablet (40 mg total) by mouth every morning.  . potassium citrate (UROCIT-K) 10 MEQ (1080 MG) SR tablet Take 1 tablet (10 mEq total)  by mouth 2 (two) times daily. Taking up to TID   No Known Allergies Past Medical History:  Diagnosis Date  . GERD (gastroesophageal reflux disease)   . History of exercise stress test    03-07-2010  Stress echo--- no arrhythmias or conduction abnormalilites and negative for ischemia or chest pain  . History of kidney stones   . History of paroxysmal supraventricular tachycardia    episode 2011  consult w/ dr Caryl Comes --  put on atenolol--  per pt no longer an issue  . HTN (hypertension)    cardiologist --  dr Stanford Breed  . IBS (irritable bowel syndrome)    diarrhea  . Nonalcoholic steatohepatitis (NASH)   . OA (osteoarthritis)    knees  . PONV (postoperative nausea and vomiting)   . Right knee meniscal tear   . Vitamin D deficiency    Family History  Problem Relation Age of Onset  . Lymphoma Mother        non-hodgkins - died @ 74  . Coronary artery disease Mother   . Coronary artery disease Father        CABG in his 54s, died @ 29   Past Surgical History:  Procedure Laterality Date  . ACHILLES TENDON SURGERY Left 06/2016  . BREAST BIOPSY Left 02/2016   benign  . COLONOSCOPY  2005  . CT CTA CORONARY W/CA SCORE W/CM &/OR WO/CM  01/01/2014   non-obstructive calcified plaque in pLAD (0-25%), no significant incidental noncardiac findings noted  . ESOPHAGOGASTRODUODENOSCOPY  01/2014  . EXTRACORPOREAL SHOCK WAVE LITHOTRIPSY  2010  . KNEE ARTHROSCOPY Right 12/04/2016   Procedure: ARTHROSCOPY KNEE WITH PARTIAL MEDIAL MENISCECTOMY;  Surgeon: Rod Can, MD;  Location: [facility];  Service: Orthopedics;  Laterality: Right;  . LEFT HEART CATHETERIZATION WITH CORONARY ANGIOGRAM N/A 01/11/2012   Procedure: LEFT HEART CATHETERIZATION WITH CORONARY ANGIOGRAM;  Surgeon: Sherren Mocha, MD;  Location: [facility] CATH LAB;  Service: Cardiovascular;  Laterality: N/A;  widely patent coronary arterires without significant obstructive CAD,  normal LVF, ef 55-56%  . TONSILLECTOMY AND  ADENOIDECTOMY  child  . TRANSTHORACIC ECHOCARDIOGRAM  01/18/2012   mild LVH,  ef 60%/  trivial TR  . TUBAL LIGATION  1985   Social History   Socioeconomic History  . Marital status: Widowed    Spouse name: Not on file  . Number of children: Not on file  . Years of education: Not on file  . Highest education level: Not on file  Social Needs  . Financial resource strain: Not on file  . Food insecurity - worry: Not on file  . Food insecurity - inability: Not on file  . Transportation needs - medical: Not on file  . Transportation needs - non-medical: Not on file  Occupational History  . Not on file  Tobacco Use  . Smoking status: Never Smoker  . Smokeless tobacco: Never Used  Substance and Sexual Activity  . Alcohol use: Yes    Comment: Rare  . Drug use: No  . Sexual activity: Not on file  Other Topics Concern  . Not on file  Social History Narrative   Does not routinely exercise.  Has to walk up stairs to work and can do w/o Ss.  Lives @ home in [place] with family.     Review of Systems: General: negative for chills, fever, night sweats or weight changes.  Cardiovascular: negative for chest pain, dyspnea on exertion, edema, orthopnea, palpitations, paroxysmal nocturnal dyspnea or shortness of breath Dermatological: negative for rash Respiratory: negative for cough or wheezing Urologic: negative for hematuria Abdominal: negative for nausea, vomiting, diarrhea, bright red blood per rectum, melena, or hematemesis Neurologic: negative for visual changes, syncope, or dizziness All other systems reviewed and are otherwise negative except as noted above.   Physical Exam:  Blood pressure 126/84, pulse 74, height 5' 4"  (1.626 m), weight 217 lb (98.4 kg), SpO2 95 %.  General appearance: alert, cooperative, no distress and morbidly obese Neck: no carotid bruit and no JVD Lungs: clear to auscultation bilaterally Heart: regular rate and rhythm, S1, S2 normal, no murmur, click, rub or  gallop Extremities: extremities normal, atraumatic, no cyanosis or edema Pulses: 2+ and symmetric Skin: Skin color, texture, turgor normal. No rashes or lesions Neurologic: Grossly normal  EKG NSR, 74 bpm -- personally reviewed   ASSESSMENT AND PLAN:   1. Preoperative CV Assessment: Pt with nonobstructive CAD, noted on cath in 2013 and coronary CTA in 2015, 0-25% proximal LAD disease, no significant disease in the RCA nor LCx. Normal EF. No anginal symptomatology. EKG NSR. Cardiovascular examination is benign, w/o cardiac murmur and carotid bruits. HR and BP well controlled. There is no h/o CHF, CVA, CKD or DM requiring insulin. According to the Revised Cardiac Risk Index (RCRI), her Perioperative Risk of Major Cardiac Event is (%): 0.4. Pt can be cleared for TKR w/o need for additional cardiac testing. We recommend continuation of BB during the perioperative period. We will fax clearance letter to [facility] orthopedics.    Dawn Moon, MHS [facility] 01/06/2018 4:27 PM

## 2018-01-07 ENCOUNTER — Other Ambulatory Visit: Payer: Self-pay | Admitting: Family

## 2018-01-07 NOTE — Telephone Encounter (Signed)
What is the name of the medication? Butalbital - APAP- 50 300-400 mg  Have you contacted your pharmacy to request a refill? NO  Which pharmacy would you like this sent to? [facility]   Patient notified that their request is being sent to the clinical staff for review and that they should receive a call once it is complete. If they do not receive a call within 24 hours they can check with their pharmacy or our office.

## 2018-01-07 NOTE — Telephone Encounter (Signed)
Please forward to [facility]

## 2018-01-10 MED ORDER — BUTALBITAL-APAP-CAFFEINE 50-300-40 MG PO CAPS: 1.0000 | ORAL_CAPSULE | Freq: Three times a day (TID) | ORAL | 2 refills | Status: DC | PRN

## 2018-01-14 ENCOUNTER — Other Ambulatory Visit: Payer: Self-pay | Admitting: Family

## 2018-01-19 ENCOUNTER — Telehealth: Payer: Self-pay | Admitting: Family

## 2018-01-19 NOTE — Telephone Encounter (Signed)
Patient states she has already got this taken care of.

## 2018-01-20 ENCOUNTER — Encounter: Payer: Self-pay | Admitting: Family

## 2018-01-20 ENCOUNTER — Ambulatory Visit (INDEPENDENT_AMBULATORY_CARE_PROVIDER_SITE_OTHER): Payer: 59 | Admitting: Family

## 2018-01-20 ENCOUNTER — Ambulatory Visit (INDEPENDENT_AMBULATORY_CARE_PROVIDER_SITE_OTHER): Payer: 59

## 2018-01-20 VITALS — BP 139/80 | HR 85 | Temp 99.7°F | Ht 64.0 in | Wt 224.0 lb

## 2018-01-20 DIAGNOSIS — M25561 Pain in right knee: Secondary | ICD-10-CM | POA: Diagnosis not present

## 2018-01-20 DIAGNOSIS — G8929 Other chronic pain: Secondary | ICD-10-CM | POA: Diagnosis not present

## 2018-01-20 DIAGNOSIS — Z01811 Encounter for preprocedural respiratory examination: Secondary | ICD-10-CM

## 2018-01-20 NOTE — Progress Notes (Signed)
   Subjective:    Patient ID: Dawn Moon, female    DOB: Jan 30, 1956, 62 y.o.   MRN: [id number]  HPI Pt presents to the office today for surgical clearance for right total knee. Pt states she has follow up with Ortho on 01/24/18 to have her surgery date scheduled.   Complaining of constant aching pain of 3 out 10 when sitting and a 5-6  When she starts walking.   PT had Cardiologists appt on 01/06/18 who cleared her from a cardiac standpoint.    Review of Systems  All other systems reviewed and are negative.      Objective:   Physical Exam  Constitutional: She is oriented to person, place, and time. She appears well-developed and well-nourished. No distress.  HENT:  Head: Normocephalic and atraumatic.  Right Ear: External ear normal.  Left Ear: External ear normal.  Nose: Nose normal.  Mouth/Throat: Posterior oropharyngeal erythema present.  Eyes: Pupils are equal, round, and reactive to light.  Neck: Normal range of motion. Neck supple. No thyromegaly present.  Cardiovascular: Normal rate, regular rhythm, normal heart sounds and intact distal pulses.  No murmur heard. Pulmonary/Chest: Effort normal and breath sounds normal. No respiratory distress. She has no wheezes.  Abdominal: Soft. Bowel sounds are normal. She exhibits no distension. There is no tenderness.  Musculoskeletal: Normal range of motion. She exhibits edema (2+ right knee) and tenderness (pain with flexion and extension).  Neurological: She is alert and oriented to person, place, and time. No cranial nerve deficit.  Skin: Skin is warm and dry.  Psychiatric: She has a normal mood and affect. Her behavior is normal. Judgment and thought content normal.  Vitals reviewed.  Chest x-ray- Normal Preliminary reading by Evelina Dun, FNP [facility]    BP 139/80   Pulse 85   Temp 99.7 F (37.6 C)   Ht 5' 4"  (1.626 m)   Wt 224 lb (101.6 kg)   BMI 38.45 kg/m      Assessment & Plan:  1. Pre-op chest exam - DG  Chest 2 View; Future - BMP8+EGFR - CBC with Differential/Platelet  2. Chronic pain of right knee - BMP8+EGFR - CBC with Differential/Platelet  Will sign form once labs are back   Evelina Dun, FNP

## 2018-01-20 NOTE — Patient Instructions (Signed)
Total Knee Replacement, Care After These instructions give you information about caring for yourself after your procedure. Your doctor may also give you more specific instructions. Call your doctor if you have any problems or questions after your procedure. Follow these instructions at home: Medicines  Take over-the-counter and prescription medicines only as told by your doctor.  If you were prescribed an antibiotic medicine, take it as told by your doctor. Do not stop taking the antibiotic even if you start to feel better.  If you were prescribed a blood thinner (anticoagulant), take it as told by your doctor. If you have a splint or brace:  Wear the splint or brace as told by your doctor. Remove it only as told by your doctor.  Loosen the splint or brace if your toes tingle, get numb, or turn cold and blue.  Do not let your splint or brace get wet if it is not waterproof.  Keep the splint or brace clean. Bathing   Do not take baths, swim, or use a hot tub until your doctor says it is okay. Ask your doctor if you can take showers. You may only be allowed to take sponge baths for bathing.  If you have a splint or brace that is not waterproof, cover it with a watertight covering when you take a bath or a shower.  Keep your bandage (dressing) dry until your doctor says it can be taken off. Incision care and drain care  Check your cut from surgery (incision) and your drain every day for signs of infection. Check for: ? More redness, swelling, or pain. ? More fluid or blood. ? Warmth. ? Pus or a bad smell.  Follow instructions from your doctor about how to take care of your cut from surgery. Make sure you: ? Wash your hands with soap and water before you change your bandage. If you cannot use soap and water, use hand sanitizer. ? Change your bandage as told by your doctor. ? Leave stitches (sutures), skin glue, or skin tape (adhesive) strips in place. They may need to stay in place  for 2 weeks or longer. If tape strips get loose and curl up, you may trim the loose edges. Do not remove tape strips completely unless your doctor says it is okay.  If you have a drain, follow instructions from your doctor about caring for it. Do not remove the drain tube or any bandages unless your doctor says it is okay. Managing pain, stiffness, and swelling   If directed, put ice on your knee. ? Put ice in a plastic bag. ? Place a towel between your skin and the bag. ? Leave the ice on for 20 minutes, 2-3 times per day.  If directed, apply heat to the affected area as often as told by your doctor. Use the heat source that your doctor recommends, such as a moist heat pack or a heating pad. ? Place a towel between your skin and the heat source. ? Leave the heat on for 20-30 minutes. ? Remove the heat if your skin turns bright red. This is especially important if you are unable to feel pain, heat, or cold. You may have a greater risk of getting burned.  Move your toes often to avoid stiffness and to lessen swelling.  Raise (elevate) your knee above the level of your heart while you are sitting or lying down.  Wear elastic knee support for as long as told by your doctor. Driving   Do not  drive until your doctor says it is okay. Ask your doctor when it is safe to drive if you have a splint or brace on your knee.  Do not drive or use heavy machinery while taking prescription pain medicine.  Do not drive for 24 hours if you received a sedative. Activity  Do not lift anything that is heavier than 10 lb (4.5 kg) until your doctor says it is okay.  Do not play contact sports until your doctor says it is okay.  Avoid high-impact activities, including running, jumping rope, and jumping jacks.  Avoid sitting for a long time without moving. Get up and move around at least every few hours.  If physical therapy was prescribed, do exercises as told by your doctor.  Return to your normal  activities as told by your doctor. Ask your doctor what activities are safe for you. Safety  Do not use your leg to support your body weight until your doctor says that you can. Use crutches or a walker as told by your doctor. General instructions   Do not have any dental work done for at least 3 months after your surgery. When you do have dental work done, tell your dentist about your joint replacement.  Do not use any tobacco products, such as cigarettes, chewing tobacco, or e-cigarettes. If you need help quitting, ask your doctor.  Wear special socks (compression stockings) as told by your doctor.  If you have been sent home with a knee joint motion machine (continuous passive motion machine), use it as told by your doctor.  Drink enough fluid to keep your pee (urine) clear or pale yellow.  If you have been told to lose weight, follow instructions from your doctor about how to do this safely.  Keep all follow-up visits as told by your doctor. This is important. Contact a doctor if:  You have more redness, swelling, or pain around your cut from surgery or your drain.  You have more fluid or blood coming from your cut from surgery or your drain.  Your cut from surgery or your drain area feels warm to the touch.  You have pus or a bad smell coming from your cut from surgery or your drain.  You have a fever.  Your cut breaks open after your doctor removes your stitches, skin glue, or skin tape strips.  Your new joint feels loose.  You have knee pain that does not go away. Get help right away if:  You have a rash.  You have pain in your calf or thigh.  You have swelling in your calf or thigh.  You have shortness of breath.  You have trouble breathing.  You have chest pain.  Your ability to move your knee is getting worse. This information is not intended to replace advice given to you by your health care provider. Make sure you discuss any questions you have with  your health care provider. Document Released: 02/29/2012 Document Revised: 08/10/2016 Document Reviewed: 11/13/2015 Elsevier Interactive Patient Education  2017 [employer].

## 2018-01-21 ENCOUNTER — Other Ambulatory Visit: Payer: Self-pay | Admitting: Family

## 2018-01-21 LAB — BMP8+EGFR
BUN/Creatinine Ratio: 11 — ABNORMAL LOW (ref 12–28)
BUN: 8 mg/dL (ref 8–27)
CHLORIDE: 104 mmol/L (ref 96–106)
CO2: 23 mmol/L (ref 20–29)
Calcium: 8.3 mg/dL — ABNORMAL LOW (ref 8.7–10.3)
Creatinine, Ser: 0.7 mg/dL (ref 0.57–1.00)
GFR calc Af Amer: 108 mL/min/{1.73_m2} (ref >59)
GFR calc non Af Amer: 94 mL/min/{1.73_m2} (ref >59)
GLUCOSE: 89 mg/dL (ref 65–99)
POTASSIUM: 3.9 mmol/L (ref 3.5–5.2)
Sodium: 143 mmol/L (ref 134–144)

## 2018-01-21 LAB — CBC WITH DIFFERENTIAL/PLATELET
BASOS ABS: 0 10*3/uL (ref 0.0–0.2)
Basos: 1 %
EOS (ABSOLUTE): 0 10*3/uL (ref 0.0–0.4)
Eos: 1 %
Hematocrit: 39.3 % (ref 34.0–46.6)
Hemoglobin: 13.1 g/dL (ref 11.1–15.9)
IMMATURE GRANS (ABS): 0 10*3/uL (ref 0.0–0.1)
IMMATURE GRANULOCYTES: 0 %
LYMPHS: 28 %
Lymphocytes Absolute: 1.2 10*3/uL (ref 0.7–3.1)
MCH: 28.1 pg (ref 26.6–33.0)
MCHC: 33.3 g/dL (ref 31.5–35.7)
MCV: 84 fL (ref 79–97)
Monocytes Absolute: 0.5 10*3/uL (ref 0.1–0.9)
Monocytes: 12 %
NEUTROS ABS: 2.4 10*3/uL (ref 1.4–7.0)
NEUTROS PCT: 58 %
PLATELETS: 118 10*3/uL — AB (ref 150–379)
RBC: 4.66 x10E6/uL (ref 3.77–5.28)
RDW: 17.5 % — AB (ref 12.3–15.4)
WBC: 4.1 10*3/uL (ref 3.4–10.8)

## 2018-01-21 MED ORDER — AZITHROMYCIN 250 MG PO TABS: ORAL_TABLET | ORAL | 0 refills | Status: DC

## 2018-01-28 ENCOUNTER — Encounter: Payer: Self-pay | Admitting: Family

## 2018-02-20 ENCOUNTER — Encounter: Payer: Self-pay | Admitting: Family Medicine

## 2018-02-21 ENCOUNTER — Other Ambulatory Visit: Payer: Self-pay

## 2018-02-21 ENCOUNTER — Encounter: Payer: Self-pay | Admitting: Family Medicine

## 2018-02-21 ENCOUNTER — Ambulatory Visit (INDEPENDENT_AMBULATORY_CARE_PROVIDER_SITE_OTHER): Payer: 59 | Admitting: Family Medicine

## 2018-02-21 VITALS — BP 140/86 | HR 112 | Temp 98.0°F | Resp 17 | Ht 63.5 in | Wt 212.4 lb

## 2018-02-21 DIAGNOSIS — M1711 Unilateral primary osteoarthritis, right knee: Secondary | ICD-10-CM | POA: Diagnosis not present

## 2018-02-21 DIAGNOSIS — I472 Ventricular tachycardia: Secondary | ICD-10-CM | POA: Diagnosis not present

## 2018-02-21 DIAGNOSIS — Z23 Encounter for immunization: Secondary | ICD-10-CM

## 2018-02-21 DIAGNOSIS — K760 Fatty (change of) liver, not elsewhere classified: Secondary | ICD-10-CM | POA: Diagnosis not present

## 2018-02-21 DIAGNOSIS — I10 Essential (primary) hypertension: Secondary | ICD-10-CM | POA: Diagnosis not present

## 2018-02-21 DIAGNOSIS — I4729 Other ventricular tachycardia: Secondary | ICD-10-CM

## 2018-02-21 NOTE — Progress Notes (Signed)
Subjective  CC:  Chief Complaint  Patient presents with  . Establish Care    HPI: Dawn Moon is a 62 y.o. female who presents to [place] at [facility] today to establish care with me as a new patient.   She has the following concerns or needs:   Has severe OA right knee and needs TKR; here for pre-op clearance. I reviewed cardiology pre-op notes. Problems listed below are well controlled. No cp or sob. Reviewed lab work.   We updated and reviewed the patient's past history in detail and it is documented below.  Patient Active Problem List   Diagnosis Date Noted  . IBS (irritable bowel syndrome) 06/15/2017  . GERD (gastroesophageal reflux disease) 06/15/2017  . HLD (hyperlipidemia)   . Fatty liver   . NSVT (nonsustained ventricular tachycardia) ([place])   . Nephrolithiasis   . Essential hypertension 02/13/2010   Health Maintenance  Topic Date Due  . PAP SMEAR  10/22/2017  . INFLUENZA VACCINE  08/12/2018 (Originally 07/21/2017)  . MAMMOGRAM  03/05/2018  . COLONOSCOPY  01/12/2021  . TETANUS/TDAP  01/24/2023  . Hepatitis C Screening  Completed  . HIV Screening  Completed   Immunization History  Administered Date(s) Administered  . Influenza,inj,Quad PF,6+ Mos 02/21/2018   Current Meds  Medication Sig  . atenolol (TENORMIN) 50 MG tablet Take 1 tablet (50 mg total) by mouth every morning.  . Butalbital-APAP-Caffeine 50-300-40 MG CAPS Take 1 tablet by mouth every 8 (eight) hours as needed.  . Eluxadoline (VIBERZI) 75 MG TABS Take 75 mg by mouth 2 (two) times daily.  . furosemide (LASIX) 20 MG tablet Take 1 tablet (20 mg total) by mouth every morning. Patient taking up to TID prn (Patient taking differently: Take 20 mg by mouth every morning. Patient may take 2 capsules (40 mg ) if needed)  . pantoprazole (PROTONIX) 40 MG tablet Take 1 tablet (40 mg total) by mouth every morning.  . potassium citrate (UROCIT-K) 10 MEQ (1080 MG) SR tablet Take 1 tablet  (10 mEq total) by mouth 2 (two) times daily. Taking up to TID  . promethazine (PHENERGAN) 12.5 MG tablet Take 12.5 mg by mouth every 6 (six) hours as needed for nausea or vomiting.    Allergies: Patient has No Known Allergies. Past Medical History Patient  has a past medical history of GERD (gastroesophageal reflux disease), History of exercise stress test, History of kidney stones, History of paroxysmal supraventricular tachycardia, HTN (hypertension), IBS (irritable bowel syndrome), Nonalcoholic steatohepatitis (NASH), OA (osteoarthritis), PONV (postoperative nausea and vomiting), Right knee meniscal tear, and Vitamin D deficiency. Past Surgical History Patient  has a past surgical history that includes Colonoscopy (2005); left heart catheterization with coronary angiogram (N/A, 01/11/2012); transthoracic echocardiogram (01/18/2012); CT CTA CORONARY W/CA SCORE W/CM &/OR WO/CM (01/01/2014); Tubal ligation (1985); Tonsillectomy and adenoidectomy (child); Achilles tendon surgery (Left, 06/2016); Breast biopsy (Left, 02/2016); Extracorporeal shock wave lithotripsy (2010); Esophagogastroduodenoscopy (01/2014); and Knee arthroscopy (Right, 12/04/2016). Family History: Patient family history includes Arthritis in her sister; Celiac disease in her daughter; Coronary artery disease in her father and mother; Diabetes in her father; Healthy in her brother; Hypertension in her sister; IgA nephropathy in her daughter; Lymphoma in her mother. Social History:  Patient  reports that  has never smoked. she has never used smokeless tobacco. She reports that she drinks alcohol. She reports that she does not use drugs.  Review of Systems: Constitutional: negative for fever or malaise Ophthalmic: negative for photophobia, double  vision or loss of vision Cardiovascular: negative for chest pain, dyspnea on exertion, or new LE swelling Respiratory: negative for SOB or persistent cough Gastrointestinal: negative for  abdominal pain, change in bowel habits or melena Genitourinary: negative for dysuria or gross hematuria Musculoskeletal: negative for new gait disturbance or muscular weakness Integumentary: negative for new or persistent rashes Neurological: negative for TIA or stroke symptoms Psychiatric: negative for SI or delusions Allergic/Immunologic: negative for hives  Patient Care Team    Relationship Specialty Notifications Start End  Leamon Arnt, MD PCP - General Family Medicine  02/21/18   Lelon Perla, MD Consulting Physician Cardiology  02/21/18   Rod Can, MD Consulting Physician Orthopedic Surgery  02/21/18      Objective  Vitals: BP 140/86   Pulse (!) 112   Temp 98 F (36.7 C) (Oral)   Resp 17   Ht 5' 3.5" (1.613 m)   Wt 212 lb 6.4 oz (96.3 kg)   SpO2 98%   BMI 37.03 kg/m  General:  Well developed, well nourished, no acute distress  Psych:  Alert and oriented,normal mood and affect HEENT:  Normocephalic, atraumatic, non-icteric sclera, PERRL, oropharynx is without mass or exudate, supple neck without adenopathy, mass or thyromegaly Cardiovascular:  RRR without gallop, + systolic murmur, nondisplaced PMI Respiratory:  Good breath sounds bilaterally, CTAB with normal respiratory effort Gastrointestinal: normal bowel sounds, soft, non-tender, no noted masses. No HSM MSK: no deformities, contusions.  Skin:  Warm, no rashes or suspicious lesions noted Neurologic:    Mental status is normal. Gross motor and sensory exams are normal. antalgic gait  Assessment  1. Essential hypertension   2. Fatty liver   3. NSVT (nonsustained ventricular tachycardia) ([place])   4. Primary osteoarthritis of right knee   5. Need for influenza vaccination      Plan   Htn. This medical condition is well controlled. There are no signs of complications, medication side effects, or red flags. Patient is instructed to continue the current treatment plan without change in therapies or  medications.   preop clearance: cleared for surgery. Form completed.   HM visit due afterward, pt to get mammogram prior. To get records on last pap to see if had HR HPV screen.   Flu shot today  Follow up:  6 months for cpe  Commons side effects, risks, benefits, and alternatives for medications and treatment plan prescribed today were discussed, and the patient expressed understanding of the given instructions. Patient is instructed to call or message via MyChart if he/she has any questions or concerns regarding our treatment plan. No barriers to understanding were identified. We discussed Red Flag symptoms and signs in detail. Patient expressed understanding regarding what to do in case of urgent or emergency type symptoms.   Medication list was reconciled, printed and provided to the patient in AVS. Patient instructions and summary information was reviewed with the patient as documented in the AVS. This note was prepared with assistance of Dragon voice recognition software. Occasional wrong-word or sound-a-like substitutions may have occurred due to the inherent limitations of voice recognition software  Orders Placed This Encounter  Procedures  . Flu Vaccine QUAD 36+ mos IM   No orders of the defined types were placed in this encounter.

## 2018-02-21 NOTE — Patient Instructions (Signed)
Please return in 6 months for your annual complete physical; please come fasting.  Please call and schedule your mammogram ASAP. :)  Please sign a release of records form for your pap smear from Dr. Harlan Stains.  It was a pleasure meeting you today! Thank you for choosing [place] to meet your healthcare needs! I truly look forward to working with you. If you have any questions or concerns, please send me a message via Mychart or call the office at [phone]. Best of luck on your surgery.

## 2018-02-23 ENCOUNTER — Ambulatory Visit: Payer: Self-pay | Admitting: Orthopedic Surgery

## 2018-03-02 ENCOUNTER — Ambulatory Visit: Payer: Self-pay | Admitting: Orthopedic Surgery

## 2018-03-02 NOTE — H&P (Signed)
TOTAL KNEE ADMISSION H&P  Patient is being admitted for right total knee arthroplasty.  Subjective:  Chief Complaint:right knee pain.  HPI: Dawn Moon, 62 y.o. female, has a history of pain and functional disability in the right knee due to arthritis and has failed non-surgical conservative treatments for greater than 12 weeks to includeNSAID's and/or analgesics, corticosteriod injections, viscosupplementation injections, flexibility and strengthening excercises, use of assistive devices, weight reduction as appropriate and activity modification.  Onset of symptoms was gradual, starting 2 years ago with rapidlly worsening course since that time. The patient noted prior procedures on the knee to include  arthroscopy on the right knee(s).  Patient currently rates pain in the right knee(s) at 10 out of 10 with activity. Patient has night pain, worsening of pain with activity and weight bearing, pain that interferes with activities of daily living, pain with passive range of motion, crepitus and joint swelling.  Patient has evidence of subchondral cysts, subchondral sclerosis, periarticular osteophytes and joint space narrowing by imaging studies. There is no active infection.  Patient Active Problem List   Diagnosis Date Noted  . IBS (irritable bowel syndrome) 06/15/2017  . GERD (gastroesophageal reflux disease) 06/15/2017  . HLD (hyperlipidemia)   . Fatty liver   . NSVT (nonsustained ventricular tachycardia) ([place])   . Nephrolithiasis   . Essential hypertension 02/13/2010   Past Medical History:  Diagnosis Date  . GERD (gastroesophageal reflux disease)   . History of exercise stress test    03-07-2010  Stress echo--- no arrhythmias or conduction abnormalilites and negative for ischemia or chest pain  . History of kidney stones   . History of paroxysmal supraventricular tachycardia    episode 2011  consult w/ dr Caryl Comes --  put on atenolol--  per pt no longer an issue  . HTN  (hypertension)    cardiologist --  dr Stanford Breed  . IBS (irritable bowel syndrome)    diarrhea  . Nonalcoholic steatohepatitis (NASH)   . OA (osteoarthritis)    knees  . PONV (postoperative nausea and vomiting)   . Right knee meniscal tear   . Vitamin D deficiency     Past Surgical History:  Procedure Laterality Date  . ACHILLES TENDON SURGERY Left 06/2016  . BREAST BIOPSY Left 02/2016   benign  . COLONOSCOPY  2005  . CT CTA CORONARY W/CA SCORE W/CM &/OR WO/CM  01/01/2014   non-obstructive calcified plaque in pLAD (0-25%), no significant incidental noncardiac findings noted  . ESOPHAGOGASTRODUODENOSCOPY  01/2014  . EXTRACORPOREAL SHOCK WAVE LITHOTRIPSY  2010  . KNEE ARTHROSCOPY Right 12/04/2016   Procedure: ARTHROSCOPY KNEE WITH PARTIAL MEDIAL MENISCECTOMY;  Surgeon: Rod Can, MD;  Location: [place];  Service: Orthopedics;  Laterality: Right;  . LEFT HEART CATHETERIZATION WITH CORONARY ANGIOGRAM N/A 01/11/2012   Procedure: LEFT HEART CATHETERIZATION WITH CORONARY ANGIOGRAM;  Surgeon: Sherren Mocha, MD;  Location: [facility] CATH LAB;  Service: Cardiovascular;  Laterality: N/A;  widely patent coronary arterires without significant obstructive CAD,  normal LVF, ef 55-56%  . TONSILLECTOMY AND ADENOIDECTOMY  child  . TRANSTHORACIC ECHOCARDIOGRAM  01/18/2012   mild LVH,  ef 60%/  trivial TR  . TUBAL LIGATION  1985    Current Outpatient Medications  Medication Sig Dispense Refill Last Dose  . albuterol (PROVENTIL HFA;VENTOLIN HFA) 108 (90 Base) MCG/ACT inhaler Inhale into the lungs every 6 (six) hours as needed for wheezing or shortness of breath.   Not Taking  . atenolol (TENORMIN) 50 MG tablet Take 1 tablet (  50 mg total) by mouth every morning. 90 tablet 1 Taking  . Butalbital-APAP-Caffeine 50-300-40 MG CAPS Take 1 tablet by mouth every 8 (eight) hours as needed. 84 capsule 2 Taking  . Eluxadoline (VIBERZI) 75 MG TABS Take 75 mg by mouth 2 (two) times daily. 60 tablet 5  Taking  . furosemide (LASIX) 20 MG tablet Take 1 tablet (20 mg total) by mouth every morning. Patient taking up to TID prn (Patient taking differently: Take 20 mg by mouth every morning. Patient may take 2 capsules (40 mg ) if needed) 90 tablet 1 Taking  . pantoprazole (PROTONIX) 40 MG tablet Take 1 tablet (40 mg total) by mouth every morning. 90 tablet 1 Taking  . potassium citrate (UROCIT-K) 10 MEQ (1080 MG) SR tablet Take 1 tablet (10 mEq total) by mouth 2 (two) times daily. Taking up to TID 90 tablet 1 Taking  . promethazine (PHENERGAN) 12.5 MG tablet Take 12.5 mg by mouth every 6 (six) hours as needed for nausea or vomiting.   Taking   No current facility-administered medications for this visit.    No Known Allergies  Social History   Tobacco Use  . Smoking status: Never Smoker  . Smokeless tobacco: Never Used  Substance Use Topics  . Alcohol use: Yes    Comment: Rare    Family History  Problem Relation Age of Onset  . Lymphoma Mother        non-hodgkins - died @ 53  . Coronary artery disease Mother   . Coronary artery disease Father        CABG in his 66s, died @ 53  . Diabetes Father   . Healthy Brother   . Hypertension Sister   . Arthritis Sister   . IgA nephropathy Daughter   . Celiac disease Daughter      Review of Systems  Constitutional: Negative.   HENT: Negative.   Eyes: Negative.   Respiratory: Negative.   Cardiovascular: Negative.   Gastrointestinal: Negative.   Genitourinary: Negative.   Musculoskeletal: Positive for back pain, joint pain and myalgias.  Skin: Negative.   Neurological: Negative.   Endo/Heme/Allergies: Negative.   Psychiatric/Behavioral: Negative.     Objective:  Physical Exam  Vitals reviewed. Constitutional: She is oriented to person, place, and time. She appears well-developed and well-nourished.  HENT:  Head: Normocephalic and atraumatic.  Eyes: Conjunctivae and EOM are normal. Pupils are equal, round, and reactive to light.   Neck: Normal range of motion. Neck supple.  Cardiovascular: Normal rate, regular rhythm and intact distal pulses.  Respiratory: Effort normal. No respiratory distress.  GI: Soft. She exhibits no distension.  Genitourinary:  Genitourinary Comments: deferred  Musculoskeletal:       Right knee: She exhibits decreased range of motion, swelling, abnormal alignment and bony tenderness. Tenderness found. Medial joint line and lateral joint line tenderness noted.  Neurological: She is alert and oriented to person, place, and time. She has normal reflexes.  Skin: Skin is warm and dry.  Psychiatric: She has a normal mood and affect. Her behavior is normal. Judgment and thought content normal.    Vital signs in last 24 hours: @VSRANGES @  Labs:   Estimated body mass index is 37.03 kg/m as calculated from the following:   Height as of 02/21/18: 5' 3.5" (1.613 m).   Weight as of 02/21/18: 96.3 kg (212 lb 6.4 oz).   Imaging Review Plain radiographs demonstrate severe degenerative joint disease of the right knee(s). The overall alignment issignificant varus. The  bone quality appears to be adequate for age and reported activity level.  Assessment/Plan:  End stage arthritis, right knee   The patient history, physical examination, clinical judgment of the provider and imaging studies are consistent with end stage degenerative joint disease of the right knee(s) and total knee arthroplasty is deemed medically necessary. The treatment options including medical management, injection therapy arthroscopy and arthroplasty were discussed at length. The risks and benefits of total knee arthroplasty were presented and reviewed. The risks due to aseptic loosening, infection, stiffness, patella tracking problems, thromboembolic complications and other imponderables were discussed. The patient acknowledged the explanation, agreed to proceed with the plan and consent was signed. Patient is being admitted for inpatient  treatment for surgery, pain control, PT, OT, prophylactic antibiotics, VTE prophylaxis, progressive ambulation and ADL's and discharge planning. The patient is planning to be discharged home with outpatient PT [facility]). Has DME.

## 2018-03-02 NOTE — H&P (View-Only) (Signed)
TOTAL KNEE ADMISSION H&P  Patient is being admitted for right total knee arthroplasty.  Subjective:  Chief Complaint:right knee pain.  HPI: Dawn Moon, 62 y.o. female, has a history of pain and functional disability in the right knee due to arthritis and has failed non-surgical conservative treatments for greater than 12 weeks to includeNSAID's and/or analgesics, corticosteriod injections, viscosupplementation injections, flexibility and strengthening excercises, use of assistive devices, weight reduction as appropriate and activity modification.  Onset of symptoms was gradual, starting 2 years ago with rapidlly worsening course since that time. The patient noted prior procedures on the knee to include  arthroscopy on the right knee(s).  Patient currently rates pain in the right knee(s) at 10 out of 10 with activity. Patient has night pain, worsening of pain with activity and weight bearing, pain that interferes with activities of daily living, pain with passive range of motion, crepitus and joint swelling.  Patient has evidence of subchondral cysts, subchondral sclerosis, periarticular osteophytes and joint space narrowing by imaging studies. There is no active infection.  Patient Active Problem List   Diagnosis Date Noted  . IBS (irritable bowel syndrome) 06/15/2017  . GERD (gastroesophageal reflux disease) 06/15/2017  . HLD (hyperlipidemia)   . Fatty liver   . NSVT (nonsustained ventricular tachycardia) ([place])   . Nephrolithiasis   . Essential hypertension 02/13/2010   Past Medical History:  Diagnosis Date  . GERD (gastroesophageal reflux disease)   . History of exercise stress test    03-07-2010  Stress echo--- no arrhythmias or conduction abnormalilites and negative for ischemia or chest pain  . History of kidney stones   . History of paroxysmal supraventricular tachycardia    episode 2011  consult w/ dr Caryl Comes --  put on atenolol--  per pt no longer an issue  . HTN  (hypertension)    cardiologist --  dr Stanford Breed  . IBS (irritable bowel syndrome)    diarrhea  . Nonalcoholic steatohepatitis (NASH)   . OA (osteoarthritis)    knees  . PONV (postoperative nausea and vomiting)   . Right knee meniscal tear   . Vitamin D deficiency     Past Surgical History:  Procedure Laterality Date  . ACHILLES TENDON SURGERY Left 06/2016  . BREAST BIOPSY Left 02/2016   benign  . COLONOSCOPY  2005  . CT CTA CORONARY W/CA SCORE W/CM &/OR WO/CM  01/01/2014   non-obstructive calcified plaque in pLAD (0-25%), no significant incidental noncardiac findings noted  . ESOPHAGOGASTRODUODENOSCOPY  01/2014  . EXTRACORPOREAL SHOCK WAVE LITHOTRIPSY  2010  . KNEE ARTHROSCOPY Right 12/04/2016   Procedure: ARTHROSCOPY KNEE WITH PARTIAL MEDIAL MENISCECTOMY;  Surgeon: Rod Can, MD;  Location: [place];  Service: Orthopedics;  Laterality: Right;  . LEFT HEART CATHETERIZATION WITH CORONARY ANGIOGRAM N/A 01/11/2012   Procedure: LEFT HEART CATHETERIZATION WITH CORONARY ANGIOGRAM;  Surgeon: Sherren Mocha, MD;  Location: [facility] CATH LAB;  Service: Cardiovascular;  Laterality: N/A;  widely patent coronary arterires without significant obstructive CAD,  normal LVF, ef 55-56%  . TONSILLECTOMY AND ADENOIDECTOMY  child  . TRANSTHORACIC ECHOCARDIOGRAM  01/18/2012   mild LVH,  ef 60%/  trivial TR  . TUBAL LIGATION  1985    Current Outpatient Medications  Medication Sig Dispense Refill Last Dose  . albuterol (PROVENTIL HFA;VENTOLIN HFA) 108 (90 Base) MCG/ACT inhaler Inhale into the lungs every 6 (six) hours as needed for wheezing or shortness of breath.   Not Taking  . atenolol (TENORMIN) 50 MG tablet Take 1 tablet (  50 mg total) by mouth every morning. 90 tablet 1 Taking  . Butalbital-APAP-Caffeine 50-300-40 MG CAPS Take 1 tablet by mouth every 8 (eight) hours as needed. 84 capsule 2 Taking  . Eluxadoline (VIBERZI) 75 MG TABS Take 75 mg by mouth 2 (two) times daily. 60 tablet 5  Taking  . furosemide (LASIX) 20 MG tablet Take 1 tablet (20 mg total) by mouth every morning. Patient taking up to TID prn (Patient taking differently: Take 20 mg by mouth every morning. Patient may take 2 capsules (40 mg ) if needed) 90 tablet 1 Taking  . pantoprazole (PROTONIX) 40 MG tablet Take 1 tablet (40 mg total) by mouth every morning. 90 tablet 1 Taking  . potassium citrate (UROCIT-K) 10 MEQ (1080 MG) SR tablet Take 1 tablet (10 mEq total) by mouth 2 (two) times daily. Taking up to TID 90 tablet 1 Taking  . promethazine (PHENERGAN) 12.5 MG tablet Take 12.5 mg by mouth every 6 (six) hours as needed for nausea or vomiting.   Taking   No current facility-administered medications for this visit.    No Known Allergies  Social History   Tobacco Use  . Smoking status: Never Smoker  . Smokeless tobacco: Never Used  Substance Use Topics  . Alcohol use: Yes    Comment: Rare    Family History  Problem Relation Age of Onset  . Lymphoma Mother        non-hodgkins - died @ 11  . Coronary artery disease Mother   . Coronary artery disease Father        CABG in his 16s, died @ 61  . Diabetes Father   . Healthy Brother   . Hypertension Sister   . Arthritis Sister   . IgA nephropathy Daughter   . Celiac disease Daughter      Review of Systems  Constitutional: Negative.   HENT: Negative.   Eyes: Negative.   Respiratory: Negative.   Cardiovascular: Negative.   Gastrointestinal: Negative.   Genitourinary: Negative.   Musculoskeletal: Positive for back pain, joint pain and myalgias.  Skin: Negative.   Neurological: Negative.   Endo/Heme/Allergies: Negative.   Psychiatric/Behavioral: Negative.     Objective:  Physical Exam  Vitals reviewed. Constitutional: She is oriented to person, place, and time. She appears well-developed and well-nourished.  HENT:  Head: Normocephalic and atraumatic.  Eyes: Conjunctivae and EOM are normal. Pupils are equal, round, and reactive to light.   Neck: Normal range of motion. Neck supple.  Cardiovascular: Normal rate, regular rhythm and intact distal pulses.  Respiratory: Effort normal. No respiratory distress.  GI: Soft. She exhibits no distension.  Genitourinary:  Genitourinary Comments: deferred  Musculoskeletal:       Right knee: She exhibits decreased range of motion, swelling, abnormal alignment and bony tenderness. Tenderness found. Medial joint line and lateral joint line tenderness noted.  Neurological: She is alert and oriented to person, place, and time. She has normal reflexes.  Skin: Skin is warm and dry.  Psychiatric: She has a normal mood and affect. Her behavior is normal. Judgment and thought content normal.    Vital signs in last 24 hours: @VSRANGES @  Labs:   Estimated body mass index is 37.03 kg/m as calculated from the following:   Height as of 02/21/18: 5' 3.5" (1.613 m).   Weight as of 02/21/18: 96.3 kg (212 lb 6.4 oz).   Imaging Review Plain radiographs demonstrate severe degenerative joint disease of the right knee(s). The overall alignment issignificant varus. The  bone quality appears to be adequate for age and reported activity level.  Assessment/Plan:  End stage arthritis, right knee   The patient history, physical examination, clinical judgment of the provider and imaging studies are consistent with end stage degenerative joint disease of the right knee(s) and total knee arthroplasty is deemed medically necessary. The treatment options including medical management, injection therapy arthroscopy and arthroplasty were discussed at length. The risks and benefits of total knee arthroplasty were presented and reviewed. The risks due to aseptic loosening, infection, stiffness, patella tracking problems, thromboembolic complications and other imponderables were discussed. The patient acknowledged the explanation, agreed to proceed with the plan and consent was signed. Patient is being admitted for inpatient  treatment for surgery, pain control, PT, OT, prophylactic antibiotics, VTE prophylaxis, progressive ambulation and ADL's and discharge planning. The patient is planning to be discharged home with outpatient PT [facility]). Has DME.

## 2018-03-17 ENCOUNTER — Encounter (HOSPITAL_COMMUNITY): Payer: Self-pay

## 2018-03-17 NOTE — Patient Instructions (Signed)
Dawn Moon  03/17/2018   Your procedure is scheduled on:  04/ 04/ 2019   (7:30am to 10:00am)  Report to [place] AM. Have a seat in the Main Lobby. Please note there is a phone at the [employer]. Please call [phone] on that phone. Someone from Short Stay will come and get you from the Main Lobby and take you to Short Stay.  Call this number if you have problems the morning of surgery [phone]   Remember: Do not eat food or drink liquids :After Midnight.     Take these medicines the morning of surgery with A SIP OF WATER:  ATENOLOL, PROTONIX, TRAMADOL IF NEEDED                                You may not have any metal on your body including hair pins and              piercings  Do not wear jewelry, make-up, lotions, powders or perfumes, deodorant             Do not wear nail polish.  Do not shave  48 hours prior to surgery.                 Do not bring valuables to the hospital. Dawn Moon.  Contacts, dentures or bridgework may not be worn into surgery.  Leave suitcase in the car. After surgery it may be brought to your room.     Special Instructions:  Deep Breathing and Leg Exercises              Please read over the following fact sheets you were given: _____________________________________________________________________             [facility] - Preparing for Surgery Before surgery, you can play an important role.  Because skin is not sterile, your skin needs to be as free of germs as possible.  You can reduce the number of germs on your skin by washing with CHG (chlorahexidine gluconate) soap before surgery.  CHG is an antiseptic cleaner which kills germs and bonds with the skin to continue killing germs even after washing. Please DO NOT use if you have an allergy to CHG or antibacterial soaps.  If your skin becomes reddened/irritated stop  using the CHG and inform your nurse when you arrive at Short Stay. Do not shave (including legs and underarms) for at least 48 hours prior to the first CHG shower.  You may shave your face/neck. Please follow these instructions carefully:  1.  Shower with CHG Soap the night before surgery and the  morning of Surgery.  2.  If you choose to wash your hair, wash your hair first as usual with your  normal  shampoo.  3.  After you shampoo, rinse your hair and body thoroughly to remove the  shampoo.                           4.  Use CHG as you would any other liquid soap.  You can apply chg directly  to the skin and wash  Gently with a scrungie or clean washcloth.  5.  Apply the CHG Soap to your body ONLY FROM THE NECK DOWN.   Do not use on face/ open                           Wound or open sores. Avoid contact with eyes, ears mouth and genitals (private parts).                       Wash face,  Genitals (private parts) with your normal soap.             6.  Wash thoroughly, paying special attention to the area where your surgery  will be performed.  7.  Thoroughly rinse your body with warm water from the neck down.  8.  DO NOT shower/wash with your normal soap after using and rinsing off  the CHG Soap.                9.  Pat yourself dry with a clean towel.            10.  Wear clean pajamas.            11.  Place clean sheets on your bed the night of your first shower and do not  sleep with pets. Day of Surgery : Do not apply any lotions/deodorants the morning of surgery.  Please wear clean clothes to the hospital/surgery center.  FAILURE TO FOLLOW THESE INSTRUCTIONS MAY RESULT IN THE CANCELLATION OF YOUR SURGERY PATIENT SIGNATURE_________________________________  NURSE SIGNATURE__________________________________  ________________________________________________________________________   Dawn Moon  An incentive spirometer is a tool that can help keep your  lungs clear and active. This tool measures how well you are filling your lungs with each breath. Taking long deep breaths may help reverse or decrease the chance of developing breathing (pulmonary) problems (especially infection) following:  A long period of time when you are unable to move or be active. BEFORE THE PROCEDURE   If the spirometer includes an indicator to show your best effort, your nurse or respiratory therapist will set it to a desired goal.  If possible, sit up straight or lean slightly forward. Try not to slouch.  Hold the incentive spirometer in an upright position. INSTRUCTIONS FOR USE  1. Sit on the edge of your bed if possible, or sit up as far as you can in bed or on a chair. 2. Hold the incentive spirometer in an upright position. 3. Breathe out normally. 4. Place the mouthpiece in your mouth and seal your lips tightly around it. 5. Breathe in slowly and as deeply as possible, raising the piston or the ball toward the top of the column. 6. Hold your breath for 3-5 seconds or for as long as possible. Allow the piston or ball to fall to the bottom of the column. 7. Remove the mouthpiece from your mouth and breathe out normally. 8. Rest for a few seconds and repeat Steps 1 through 7 at least 10 times every 1-2 hours when you are awake. Take your time and take a few normal breaths between deep breaths. 9. The spirometer may include an indicator to show your best effort. Use the indicator as a goal to work toward during each repetition. 10. After each set of 10 deep breaths, practice coughing to be sure your lungs are clear. If you have an incision (the cut made at the time of surgery),  support your incision when coughing by placing a pillow or rolled up towels firmly against it. Once you are able to get out of bed, walk around indoors and cough well. You may stop using the incentive spirometer when instructed by your caregiver.  RISKS AND COMPLICATIONS  Take your time so  you do not get dizzy or light-headed.  If you are in pain, you may need to take or ask for pain medication before doing incentive spirometry. It is harder to take a deep breath if you are having pain. AFTER USE  Rest and breathe slowly and easily.  It can be helpful to keep track of a log of your progress. Your caregiver can provide you with a simple table to help with this. If you are using the spirometer at home, follow these instructions: [place] IF:   You are having difficultly using the spirometer.  You have trouble using the spirometer as often as instructed.  Your pain medication is not giving enough relief while using the spirometer.  You develop fever of 100.5 F (38.1 C) or higher. SEEK IMMEDIATE MEDICAL CARE IF:   You cough up bloody sputum that had not been present before.  You develop fever of 102 F (38.9 C) or greater.  You develop worsening pain at or near the incision site. MAKE SURE YOU:   Understand these instructions.  Will watch your condition.  Will get help right away if you are not doing well or get worse. Document Released: 04/19/2007 Document Revised: 02/29/2012 Document Reviewed: 06/20/2007 ExitCare Patient Information 2014 Exit[employer], [place].   ________________________________________________________________________  WHAT IS A BLOOD TRANSFUSION? Blood Transfusion Information  A transfusion is the replacement of blood or some of its parts. Blood is made up of multiple cells which provide different functions.  Red blood cells carry oxygen and are used for blood loss replacement.  White blood cells fight against infection.  Platelets control bleeding.  Plasma helps clot blood.  Other blood products are available for specialized needs, such as hemophilia or other clotting disorders. BEFORE THE TRANSFUSION  Who gives blood for transfusions?   Healthy volunteers who are fully evaluated to make sure their blood is safe. This is blood  bank blood. Transfusion therapy is the safest it has ever been in the practice of medicine. Before blood is taken from a donor, a complete history is taken to make sure that person has no history of diseases nor engages in risky social behavior (examples are intravenous drug use or sexual activity with multiple partners). The donor's travel history is screened to minimize risk of transmitting infections, such as malaria. The donated blood is tested for signs of infectious diseases, such as HIV and hepatitis. The blood is then tested to be sure it is compatible with you in order to minimize the chance of a transfusion reaction. If you or a relative donates blood, this is often done in anticipation of surgery and is not appropriate for emergency situations. It takes many days to process the donated blood. RISKS AND COMPLICATIONS Although transfusion therapy is very safe and saves many lives, the main dangers of transfusion include:   Getting an infectious disease.  Developing a transfusion reaction. This is an allergic reaction to something in the blood you were given. Every precaution is taken to prevent this. The decision to have a blood transfusion has been considered carefully by your caregiver before blood is given. Blood is not given unless the benefits outweigh the risks. AFTER THE TRANSFUSION  Right after receiving a blood transfusion, you will usually feel much better and more energetic. This is especially true if your red blood cells have gotten low (anemic). The transfusion raises the level of the red blood cells which carry oxygen, and this usually causes an energy increase.  The nurse administering the transfusion will monitor you carefully for complications. HOME CARE INSTRUCTIONS  No special instructions are needed after a transfusion. You may find your energy is better. Speak with your caregiver about any limitations on activity for underlying diseases you may have. SEEK MEDICAL CARE  IF:   Your condition is not improving after your transfusion.  You develop redness or irritation at the intravenous (IV) site. SEEK IMMEDIATE MEDICAL CARE IF:  Any of the following symptoms occur over the next 12 hours:  Shaking chills.  You have a temperature by mouth above 102 F (38.9 C), not controlled by medicine.  Chest, back, or muscle pain.  People around you feel you are not acting correctly or are confused.  Shortness of breath or difficulty breathing.  Dizziness and fainting.  You get a rash or develop hives.  You have a decrease in urine output.  Your urine turns a dark color or changes to pink, red, or brown. Any of the following symptoms occur over the next 10 days:  You have a temperature by mouth above 102 F (38.9 C), not controlled by medicine.  Shortness of breath.  Weakness after normal activity.  The white part of the eye turns yellow (jaundice).  You have a decrease in the amount of urine or are urinating less often.  Your urine turns a dark color or changes to pink, red, or brown. Document Released: 12/04/2000 Document Revised: 02/29/2012 Document Reviewed: 07/23/2008 [facility] Patient Information 2014 [place], [place].  _______________________________________________________________________

## 2018-03-18 ENCOUNTER — Encounter (HOSPITAL_COMMUNITY): Payer: Self-pay

## 2018-03-18 ENCOUNTER — Encounter (HOSPITAL_COMMUNITY)
Admission: RE | Admit: 2018-03-18 | Discharge: 2018-03-18 | Disposition: A | Discharge status: Home / Self Care | Payer: 59 | Source: Ambulatory Visit | Attending: Orthopedic Surgery | Admitting: Orthopedic Surgery

## 2018-03-18 ENCOUNTER — Other Ambulatory Visit: Payer: Self-pay

## 2018-03-18 DIAGNOSIS — Z01812 Encounter for preprocedural laboratory examination: Secondary | ICD-10-CM | POA: Insufficient documentation

## 2018-03-18 DIAGNOSIS — M1711 Unilateral primary osteoarthritis, right knee: Secondary | ICD-10-CM | POA: Diagnosis not present

## 2018-03-18 HISTORY — DX: Anesthesia of skin: R20.0

## 2018-03-18 HISTORY — DX: Paresthesia of skin: R20.2

## 2018-03-18 HISTORY — DX: Presence of spectacles and contact lenses: Z97.3

## 2018-03-18 LAB — BASIC METABOLIC PANEL
ANION GAP: 8 (ref 5–15)
BUN: <5 mg/dL — ABNORMAL LOW (ref 6–20)
CALCIUM: 8.4 mg/dL — AB (ref 8.9–10.3)
CO2: 27 mmol/L (ref 22–32)
Chloride: 107 mmol/L (ref 101–111)
Creatinine, Ser: 0.67 mg/dL (ref 0.44–1.00)
Glucose, Bld: 101 mg/dL — ABNORMAL HIGH (ref 65–99)
Potassium: 3.7 mmol/L (ref 3.5–5.1)
Sodium: 142 mmol/L (ref 135–145)

## 2018-03-18 LAB — CBC
HCT: 40.7 % (ref 36.0–46.0)
HEMOGLOBIN: 13.6 g/dL (ref 12.0–15.0)
MCH: 29.5 pg (ref 26.0–34.0)
MCHC: 33.4 g/dL (ref 30.0–36.0)
MCV: 88.3 fL (ref 78.0–100.0)
Platelets: 130 10*3/uL — ABNORMAL LOW (ref 150–400)
RBC: 4.61 MIL/uL (ref 3.87–5.11)
RDW: 16.2 % — ABNORMAL HIGH (ref 11.5–15.5)
WBC: 5.7 10*3/uL (ref 4.0–10.5)

## 2018-03-18 LAB — SURGICAL PCR SCREEN
MRSA, PCR: POSITIVE — AB
STAPHYLOCOCCUS AUREUS: POSITIVE — AB

## 2018-03-18 LAB — ABO/RH: ABO/RH(D): B POS

## 2018-03-18 NOTE — Progress Notes (Signed)
Cardiac clearance by brittany simmons PA (primary cardiologist, dr Stanford Breed) dated 01-06-2018 in chart and lov note dated 01-06-2018 in epic. PCP clearance by dr Rosendo Gros andy dated 02-21-2018 in chart and lov note dated 02-21-2018 in epic. EKG dated 01-06-2018 in epic. CXR dated 01-20-2018 in epic.

## 2018-03-18 NOTE — Progress Notes (Signed)
BMET result dated 03-18-2018 sent to dr swinteck in epic.

## 2018-03-18 NOTE — Progress Notes (Signed)
PCR screen result, positive for mrsa and staph, dated 03-18-2018 routed to dr swinteck in epic.

## 2018-03-23 MED ORDER — SODIUM CHLORIDE 0.9 % IV SOLN
1000.0000 mg | INTRAVENOUS | Status: AC
  Administered 2018-03-24: 1000 mg via INTRAVENOUS
  Filled 2018-03-23: qty 1100

## 2018-03-23 NOTE — Anesthesia Preprocedure Evaluation (Addendum)
Anesthesia Evaluation  Patient identified by MRN, date of birth, ID band Patient awake    Reviewed: Allergy & Precautions, Patient's Chart, lab work & pertinent test results  History of Anesthesia Complications (+) PONV  Airway Mallampati: II  TM Distance: >3 FB Neck ROM: Full    Dental no notable dental hx.    Pulmonary neg pulmonary ROS,    Pulmonary exam normal breath sounds clear to auscultation       Cardiovascular Exercise Tolerance: Good hypertension, Pt. on home beta blockers Normal cardiovascular exam Rhythm:Regular Rate:Normal     Neuro/Psych negative neurological ROS  negative psych ROS   GI/Hepatic GERD  ,  Endo/Other    Renal/GU negative Renal ROS     Musculoskeletal  (+) Arthritis , Osteoarthritis,    Abdominal (+) + obese,   Peds  Hematology   Anesthesia Other Findings   Reproductive/Obstetrics                           Lab Results  Component Value Date   CREATININE 0.67 03/18/2018   BUN <5 (L) 03/18/2018   NA 142 03/18/2018   K 3.7 03/18/2018   CL 107 03/18/2018   CO2 27 03/18/2018    Lab Results  Component Value Date   WBC 5.7 03/18/2018   HGB 13.6 03/18/2018   HCT 40.7 03/18/2018   MCV 88.3 03/18/2018   PLT 130 (L) 03/18/2018    Anesthesia Physical Anesthesia Plan  ASA: III  Anesthesia Plan: Regional and Spinal   Post-op Pain Management:    Induction:   PONV Risk Score and Plan: Treatment may vary due to age or medical condition  Airway Management Planned: Mask, Natural Airway and Nasal Cannula  Additional Equipment:   Intra-op Plan:   Post-operative Plan:   Informed Consent: I have reviewed the patients History and Physical, chart, labs and discussed the procedure including the risks, benefits and alternatives for the proposed anesthesia with the patient or authorized representative who has indicated his/her understanding and acceptance.    Dental advisory given  Plan Discussed with: CRNA  Anesthesia Plan Comments:         Anesthesia Quick Evaluation

## 2018-03-24 ENCOUNTER — Inpatient Hospital Stay (HOSPITAL_COMMUNITY): Payer: 59 | Admitting: Anesthesiology

## 2018-03-24 ENCOUNTER — Encounter (HOSPITAL_COMMUNITY): Payer: Self-pay | Admitting: Anesthesiology

## 2018-03-24 ENCOUNTER — Encounter (HOSPITAL_COMMUNITY)
Admission: RE | Disposition: A | Discharge status: Home / Self Care | Payer: Self-pay | Source: Ambulatory Visit | Attending: Orthopedic Surgery

## 2018-03-24 ENCOUNTER — Inpatient Hospital Stay (HOSPITAL_COMMUNITY): Payer: 59

## 2018-03-24 ENCOUNTER — Inpatient Hospital Stay (HOSPITAL_COMMUNITY)
Admission: RE | Admit: 2018-03-24 | Discharge: 2018-03-25 | DRG: 470 | Disposition: A | Discharge status: Home / Self Care | Payer: 59 | Source: Ambulatory Visit | Attending: Orthopedic Surgery | Admitting: Orthopedic Surgery

## 2018-03-24 ENCOUNTER — Other Ambulatory Visit: Payer: Self-pay

## 2018-03-24 DIAGNOSIS — K219 Gastro-esophageal reflux disease without esophagitis: Secondary | ICD-10-CM | POA: Diagnosis present

## 2018-03-24 DIAGNOSIS — K589 Irritable bowel syndrome without diarrhea: Secondary | ICD-10-CM | POA: Diagnosis present

## 2018-03-24 DIAGNOSIS — E669 Obesity, unspecified: Secondary | ICD-10-CM | POA: Diagnosis present

## 2018-03-24 DIAGNOSIS — I10 Essential (primary) hypertension: Secondary | ICD-10-CM | POA: Diagnosis present

## 2018-03-24 DIAGNOSIS — Z6836 Body mass index (BMI) 36.0-36.9, adult: Secondary | ICD-10-CM

## 2018-03-24 DIAGNOSIS — Z96651 Presence of right artificial knee joint: Secondary | ICD-10-CM

## 2018-03-24 DIAGNOSIS — M1711 Unilateral primary osteoarthritis, right knee: Principal | ICD-10-CM | POA: Diagnosis present

## 2018-03-24 DIAGNOSIS — Z79899 Other long term (current) drug therapy: Secondary | ICD-10-CM

## 2018-03-24 HISTORY — PX: KNEE ARTHROPLASTY: SHX992

## 2018-03-24 LAB — TYPE AND SCREEN
ABO/RH(D): B POS
Antibody Screen: NEGATIVE

## 2018-03-24 SURGERY — ARTHROPLASTY, KNEE, TOTAL, USING IMAGELESS COMPUTER-ASSISTED NAVIGATION
Anesthesia: Regional | Site: Hip | Laterality: Right

## 2018-03-24 MED ORDER — SODIUM CHLORIDE 0.9 % IR SOLN
Status: DC | PRN
  Administered 2018-03-24: 1000 mL

## 2018-03-24 MED ORDER — PROPOFOL 10 MG/ML IV BOLUS
INTRAVENOUS | Status: AC
  Filled 2018-03-24: qty 20

## 2018-03-24 MED ORDER — ACETAMINOPHEN 10 MG/ML IV SOLN
INTRAVENOUS | Status: AC
  Filled 2018-03-24: qty 100

## 2018-03-24 MED ORDER — HYDROMORPHONE HCL 1 MG/ML IJ SOLN
0.2500 mg | INTRAMUSCULAR | Status: DC | PRN
  Administered 2018-03-24 (×2): 0.25 mg via INTRAVENOUS

## 2018-03-24 MED ORDER — ACETAMINOPHEN 325 MG PO TABS: 325.0000 mg | ORAL_TABLET | Freq: Four times a day (QID) | ORAL | Status: DC | PRN

## 2018-03-24 MED ORDER — MIDAZOLAM HCL 2 MG/2ML IJ SOLN
INTRAMUSCULAR | Status: AC
  Filled 2018-03-24: qty 2

## 2018-03-24 MED ORDER — PANTOPRAZOLE SODIUM 40 MG PO TBEC
40.0000 mg | DELAYED_RELEASE_TABLET | Freq: Every morning | ORAL | Status: DC
  Administered 2018-03-25: 40 mg via ORAL
  Filled 2018-03-24: qty 1

## 2018-03-24 MED ORDER — POVIDONE-IODINE 10 % EX SWAB
2.0000 "application " | Freq: Once | CUTANEOUS | Status: AC
  Administered 2018-03-24: 2 via TOPICAL

## 2018-03-24 MED ORDER — PHENYLEPHRINE 40 MCG/ML (10ML) SYRINGE FOR IV PUSH (FOR BLOOD PRESSURE SUPPORT)
PREFILLED_SYRINGE | INTRAVENOUS | Status: DC | PRN
  Administered 2018-03-24 (×2): 80 ug via INTRAVENOUS

## 2018-03-24 MED ORDER — ONDANSETRON HCL 4 MG/2ML IJ SOLN: 4.0000 mg | Freq: Four times a day (QID) | INTRAMUSCULAR | Status: DC | PRN

## 2018-03-24 MED ORDER — ISOPROPYL ALCOHOL 70 % SOLN
Status: DC | PRN
  Administered 2018-03-24: 1 via TOPICAL

## 2018-03-24 MED ORDER — DOCUSATE SODIUM 100 MG PO CAPS
100.0000 mg | ORAL_CAPSULE | Freq: Two times a day (BID) | ORAL | Status: DC
  Administered 2018-03-24: 100 mg via ORAL
  Filled 2018-03-24 (×2): qty 1

## 2018-03-24 MED ORDER — PROPOFOL 10 MG/ML IV BOLUS
INTRAVENOUS | Status: DC | PRN
  Administered 2018-03-24: 20 mg via INTRAVENOUS

## 2018-03-24 MED ORDER — METOCLOPRAMIDE HCL 5 MG/ML IJ SOLN
5.0000 mg | Freq: Three times a day (TID) | INTRAMUSCULAR | Status: DC | PRN
  Administered 2018-03-24: 10 mg via INTRAVENOUS
  Filled 2018-03-24: qty 2

## 2018-03-24 MED ORDER — ACETAMINOPHEN 10 MG/ML IV SOLN: 1000.0000 mg | Freq: Once | INTRAVENOUS | Status: DC | PRN

## 2018-03-24 MED ORDER — SODIUM CHLORIDE 0.9 % IV SOLN
INTRAVENOUS | Status: DC
  Administered 2018-03-24 (×2): via INTRAVENOUS

## 2018-03-24 MED ORDER — PHENYLEPHRINE HCL 10 MG/ML IJ SOLN
INTRAMUSCULAR | Status: DC | PRN
  Administered 2018-03-24: 50 ug/min via INTRAVENOUS

## 2018-03-24 MED ORDER — PROMETHAZINE HCL 25 MG/ML IJ SOLN: 6.2500 mg | INTRAMUSCULAR | Status: DC | PRN

## 2018-03-24 MED ORDER — DEXAMETHASONE SODIUM PHOSPHATE 10 MG/ML IJ SOLN
INTRAMUSCULAR | Status: DC | PRN
Start: 2018-03-24 — End: 2018-03-24
  Administered 2018-03-24: 10 mg via INTRAVENOUS

## 2018-03-24 MED ORDER — DIPHENHYDRAMINE HCL 12.5 MG/5ML PO ELIX: 12.5000 mg | ORAL_SOLUTION | ORAL | Status: DC | PRN

## 2018-03-24 MED ORDER — HYDROMORPHONE HCL 1 MG/ML IJ SOLN
INTRAMUSCULAR | Status: AC
  Filled 2018-03-24: qty 1

## 2018-03-24 MED ORDER — POTASSIUM CITRATE ER 10 MEQ (1080 MG) PO TBCR
10.0000 meq | EXTENDED_RELEASE_TABLET | Freq: Two times a day (BID) | ORAL | Status: DC
  Administered 2018-03-24 – 2018-03-25 (×2): 10 meq via ORAL
  Filled 2018-03-24 (×2): qty 1

## 2018-03-24 MED ORDER — HYDROCODONE-ACETAMINOPHEN 7.5-325 MG PO TABS: 1.0000 | ORAL_TABLET | Freq: Once | ORAL | Status: DC | PRN

## 2018-03-24 MED ORDER — SODIUM CHLORIDE 0.9 % IR SOLN
Status: DC | PRN
Start: 2018-03-24 — End: 2018-03-24
  Administered 2018-03-24: 1000 mL

## 2018-03-24 MED ORDER — PHENOL 1.4 % MT LIQD: 1.0000 | OROMUCOSAL | Status: DC | PRN

## 2018-03-24 MED ORDER — VANCOMYCIN HCL IN DEXTROSE 1-5 GM/200ML-% IV SOLN
1000.0000 mg | INTRAVENOUS | Status: AC
  Filled 2018-03-24: qty 200

## 2018-03-24 MED ORDER — PROPOFOL 10 MG/ML IV BOLUS
INTRAVENOUS | Status: AC
  Filled 2018-03-24: qty 60

## 2018-03-24 MED ORDER — KETOROLAC TROMETHAMINE 30 MG/ML IJ SOLN
INTRAMUSCULAR | Status: DC | PRN
  Administered 2018-03-24: 30 mg via INTRAVENOUS

## 2018-03-24 MED ORDER — BUPIVACAINE HCL (PF) 0.25 % IJ SOLN
INTRAMUSCULAR | Status: DC | PRN
  Administered 2018-03-24: 30 mL

## 2018-03-24 MED ORDER — ONDANSETRON HCL 4 MG/2ML IJ SOLN
INTRAMUSCULAR | Status: AC
  Filled 2018-03-24: qty 2

## 2018-03-24 MED ORDER — ONDANSETRON HCL 4 MG/2ML IJ SOLN
INTRAMUSCULAR | Status: DC | PRN
  Administered 2018-03-24: 4 mg via INTRAVENOUS

## 2018-03-24 MED ORDER — MIDAZOLAM HCL 5 MG/5ML IJ SOLN
INTRAMUSCULAR | Status: DC | PRN
  Administered 2018-03-24 (×2): 1 mg via INTRAVENOUS

## 2018-03-24 MED ORDER — FENTANYL CITRATE (PF) 100 MCG/2ML IJ SOLN
INTRAMUSCULAR | Status: AC
  Filled 2018-03-24: qty 2

## 2018-03-24 MED ORDER — CHLORHEXIDINE GLUCONATE 4 % EX LIQD: 60.0000 mL | Freq: Once | CUTANEOUS | Status: DC

## 2018-03-24 MED ORDER — PHENYLEPHRINE HCL 10 MG/ML IJ SOLN
INTRAMUSCULAR | Status: AC
  Filled 2018-03-24: qty 1

## 2018-03-24 MED ORDER — DEXAMETHASONE SODIUM PHOSPHATE 10 MG/ML IJ SOLN
INTRAMUSCULAR | Status: AC
  Filled 2018-03-24: qty 1

## 2018-03-24 MED ORDER — KETOROLAC TROMETHAMINE 15 MG/ML IJ SOLN: 15.0000 mg | Freq: Four times a day (QID) | INTRAMUSCULAR | Status: DC

## 2018-03-24 MED ORDER — PHENYLEPHRINE 40 MCG/ML (10ML) SYRINGE FOR IV PUSH (FOR BLOOD PRESSURE SUPPORT)
PREFILLED_SYRINGE | INTRAVENOUS | Status: AC
Start: 2018-03-24 — End: 2018-03-24
  Filled 2018-03-24: qty 10

## 2018-03-24 MED ORDER — POLYETHYLENE GLYCOL 3350 17 G PO PACK: 17.0000 g | PACK | Freq: Every day | ORAL | Status: DC | PRN

## 2018-03-24 MED ORDER — PROMETHAZINE HCL 25 MG PO TABS
25.0000 mg | ORAL_TABLET | Freq: Four times a day (QID) | ORAL | Status: DC | PRN
  Administered 2018-03-24: 25 mg via ORAL
  Filled 2018-03-24: qty 1

## 2018-03-24 MED ORDER — FUROSEMIDE 20 MG PO TABS
20.0000 mg | ORAL_TABLET | Freq: Every morning | ORAL | Status: DC
  Administered 2018-03-25: 10:00:00 20 mg via ORAL
  Filled 2018-03-24: qty 1

## 2018-03-24 MED ORDER — METHOCARBAMOL 1000 MG/10ML IJ SOLN
500.0000 mg | Freq: Four times a day (QID) | INTRAVENOUS | Status: DC | PRN
  Administered 2018-03-24: 500 mg via INTRAVENOUS
  Filled 2018-03-24: qty 550

## 2018-03-24 MED ORDER — KETOROLAC TROMETHAMINE 30 MG/ML IJ SOLN
INTRAMUSCULAR | Status: AC
  Filled 2018-03-24: qty 1

## 2018-03-24 MED ORDER — SODIUM CHLORIDE 0.9 % IJ SOLN
INTRAMUSCULAR | Status: DC | PRN
  Administered 2018-03-24: 30 mL via INTRAVENOUS

## 2018-03-24 MED ORDER — KETOROLAC TROMETHAMINE 15 MG/ML IJ SOLN
15.0000 mg | Freq: Four times a day (QID) | INTRAMUSCULAR | Status: AC
  Administered 2018-03-24 – 2018-03-25 (×4): 15 mg via INTRAVENOUS
  Filled 2018-03-24 (×3): qty 1

## 2018-03-24 MED ORDER — OXYCODONE HCL ER 10 MG PO T12A
10.0000 mg | EXTENDED_RELEASE_TABLET | Freq: Two times a day (BID) | ORAL | Status: DC
  Administered 2018-03-24 (×2): 10 mg via ORAL
  Filled 2018-03-24 (×3): qty 1

## 2018-03-24 MED ORDER — ASPIRIN 81 MG PO CHEW
81.0000 mg | CHEWABLE_TABLET | Freq: Two times a day (BID) | ORAL | Status: DC
  Administered 2018-03-24 – 2018-03-25 (×2): 81 mg via ORAL
  Filled 2018-03-24 (×2): qty 1

## 2018-03-24 MED ORDER — PROPOFOL 500 MG/50ML IV EMUL
INTRAVENOUS | Status: DC | PRN
  Administered 2018-03-24: 120 ug/kg/min via INTRAVENOUS

## 2018-03-24 MED ORDER — METOCLOPRAMIDE HCL 5 MG PO TABS: 5.0000 mg | ORAL_TABLET | Freq: Three times a day (TID) | ORAL | Status: DC | PRN

## 2018-03-24 MED ORDER — VANCOMYCIN HCL IN DEXTROSE 1-5 GM/200ML-% IV SOLN
1000.0000 mg | Freq: Two times a day (BID) | INTRAVENOUS | Status: AC
  Administered 2018-03-24: 18:00:00 1000 mg via INTRAVENOUS
  Filled 2018-03-24: qty 200

## 2018-03-24 MED ORDER — MENTHOL 3 MG MT LOZG: 1.0000 | LOZENGE | OROMUCOSAL | Status: DC | PRN

## 2018-03-24 MED ORDER — BUPIVACAINE HCL (PF) 0.25 % IJ SOLN
INTRAMUSCULAR | Status: AC
  Filled 2018-03-24: qty 30

## 2018-03-24 MED ORDER — METHOCARBAMOL 500 MG PO TABS
500.0000 mg | ORAL_TABLET | Freq: Four times a day (QID) | ORAL | Status: DC | PRN
  Administered 2018-03-24: 19:00:00 500 mg via ORAL
  Filled 2018-03-24: qty 1

## 2018-03-24 MED ORDER — SODIUM CHLORIDE 0.9 % IJ SOLN
INTRAMUSCULAR | Status: AC
  Filled 2018-03-24: qty 50

## 2018-03-24 MED ORDER — ACETAMINOPHEN 10 MG/ML IV SOLN
1000.0000 mg | INTRAVENOUS | Status: AC
  Administered 2018-03-24: 1000 mg via INTRAVENOUS
  Filled 2018-03-24: qty 100

## 2018-03-24 MED ORDER — ROPIVACAINE HCL 7.5 MG/ML IJ SOLN
INTRAMUSCULAR | Status: DC | PRN
  Administered 2018-03-24: 20 mL via PERINEURAL

## 2018-03-24 MED ORDER — ONDANSETRON HCL 4 MG PO TABS: 4.0000 mg | ORAL_TABLET | Freq: Four times a day (QID) | ORAL | Status: DC | PRN

## 2018-03-24 MED ORDER — LACTATED RINGERS IV SOLN
INTRAVENOUS | Status: DC
  Administered 2018-03-24 (×2): via INTRAVENOUS

## 2018-03-24 MED ORDER — FENTANYL CITRATE (PF) 100 MCG/2ML IJ SOLN
INTRAMUSCULAR | Status: DC | PRN
  Administered 2018-03-24 (×2): 50 ug via INTRAVENOUS

## 2018-03-24 MED ORDER — MEPERIDINE HCL 50 MG/ML IJ SOLN: 6.2500 mg | INTRAMUSCULAR | Status: DC | PRN

## 2018-03-24 MED ORDER — ELUXADOLINE 75 MG PO TABS
75.0000 mg | ORAL_TABLET | Freq: Two times a day (BID) | ORAL | Status: DC
  Administered 2018-03-25: 10:00:00 75 mg via ORAL

## 2018-03-24 MED ORDER — BUPIVACAINE IN DEXTROSE 0.75-8.25 % IT SOLN
INTRATHECAL | Status: DC | PRN
  Administered 2018-03-24: 2 mL via INTRATHECAL

## 2018-03-24 MED ORDER — VANCOMYCIN HCL IN DEXTROSE 1-5 GM/200ML-% IV SOLN
1000.0000 mg | Freq: Once | INTRAVENOUS | Status: AC
  Administered 2018-03-24: 1000 mg via INTRAVENOUS

## 2018-03-24 MED ORDER — DEXAMETHASONE SODIUM PHOSPHATE 10 MG/ML IJ SOLN
10.0000 mg | Freq: Once | INTRAMUSCULAR | Status: AC
  Administered 2018-03-25: 10 mg via INTRAVENOUS
  Filled 2018-03-24: qty 1

## 2018-03-24 MED ORDER — LIDOCAINE 2% (20 MG/ML) 5 ML SYRINGE
INTRAMUSCULAR | Status: AC
  Filled 2018-03-24: qty 5

## 2018-03-24 MED ORDER — HYDROCODONE-ACETAMINOPHEN 5-325 MG PO TABS
1.0000 | ORAL_TABLET | ORAL | Status: DC | PRN
  Administered 2018-03-24 (×2): 2 via ORAL
  Filled 2018-03-24 (×2): qty 2

## 2018-03-24 MED ORDER — ISOPROPYL ALCOHOL 70 % SOLN
Status: AC
  Filled 2018-03-24: qty 480

## 2018-03-24 MED ORDER — ALUM & MAG HYDROXIDE-SIMETH 200-200-20 MG/5ML PO SUSP: 30.0000 mL | ORAL | Status: DC | PRN

## 2018-03-24 MED ORDER — MORPHINE SULFATE (PF) 2 MG/ML IV SOLN: 0.5000 mg | INTRAVENOUS | Status: DC | PRN

## 2018-03-24 MED ORDER — SODIUM CHLORIDE 0.9 % IV SOLN: INTRAVENOUS | Status: DC

## 2018-03-24 MED ORDER — ATENOLOL 50 MG PO TABS
50.0000 mg | ORAL_TABLET | Freq: Every morning | ORAL | Status: DC
  Administered 2018-03-25: 10:00:00 50 mg via ORAL
  Filled 2018-03-24: qty 1

## 2018-03-24 SURGICAL SUPPLY — 69 items
ADH SKN CLS APL DERMABOND .7 (GAUZE/BANDAGES/DRESSINGS) ×1
BAG SPEC THK2 15X12 ZIP CLS (MISCELLANEOUS)
BAG ZIPLOCK 12X15 (MISCELLANEOUS) IMPLANT
BANDAGE ACE 4X5 VEL STRL LF (GAUZE/BANDAGES/DRESSINGS) ×3 IMPLANT
BANDAGE ACE 6X5 VEL STRL LF (GAUZE/BANDAGES/DRESSINGS) ×3 IMPLANT
BASEPLATE TIBIAL TRIATH (Orthopedic Implant) ×3 IMPLANT
BLADE SAW RECIPROCATING 77.5 (BLADE) ×3 IMPLANT
BONE CEMENT SIMPLEX TOBRAMYCIN (Cement) ×9 IMPLANT
BSPLAT TIB 4 UNV CMNT TL STAB (Orthopedic Implant) ×1 IMPLANT
CEMENT BONE SIMPLEX TOBRAMYCIN (Cement) ×3 IMPLANT
CHLORAPREP W/TINT 26ML (MISCELLANEOUS) ×6 IMPLANT
CMPNT FEM 4 RT CRCTE RTN CMNT (Joint) ×1 IMPLANT
COMPONENT FEM 4 RT CRT RTN CMN (Joint) ×1 IMPLANT
COVER SURGICAL LIGHT HANDLE (MISCELLANEOUS) ×3 IMPLANT
CUFF TOURN SGL QUICK 34 (TOURNIQUET CUFF) ×3
CUFF TRNQT CYL 34X4X40X1 (TOURNIQUET CUFF) ×1 IMPLANT
DECANTER SPIKE VIAL GLASS SM (MISCELLANEOUS) ×4 IMPLANT
DERMABOND ADVANCED (GAUZE/BANDAGES/DRESSINGS) ×2
DERMABOND ADVANCED .7 DNX12 (GAUZE/BANDAGES/DRESSINGS) ×2 IMPLANT
DRAPE SHEET LG 3/4 BI-LAMINATE (DRAPES) ×6 IMPLANT
DRAPE U-SHAPE 47X51 STRL (DRAPES) ×3 IMPLANT
DRSG AQUACEL AG ADV 3.5X10 (GAUZE/BANDAGES/DRESSINGS) ×3 IMPLANT
DRSG TEGADERM 4X4.75 (GAUZE/BANDAGES/DRESSINGS) IMPLANT
ELECT BLADE TIP CTD 4 INCH (ELECTRODE) ×3 IMPLANT
ELECT REM PT RETURN 15FT ADLT (MISCELLANEOUS) ×3 IMPLANT
EVACUATOR 1/8 PVC DRAIN (DRAIN) IMPLANT
GAUZE SPONGE 4X4 12PLY STRL (GAUZE/BANDAGES/DRESSINGS) ×3 IMPLANT
GLOVE BIO SURGEON STRL SZ8.5 (GLOVE) ×6 IMPLANT
GLOVE BIOGEL M STRL SZ7.5 (GLOVE) ×3 IMPLANT
GLOVE BIOGEL PI IND STRL 7.5 (GLOVE) ×7 IMPLANT
GLOVE BIOGEL PI IND STRL 8.5 (GLOVE) ×1 IMPLANT
GLOVE BIOGEL PI INDICATOR 7.5 (GLOVE) ×14
GLOVE BIOGEL PI INDICATOR 8.5 (GLOVE) ×2
GOWN SPEC L3 XXLG W/TWL (GOWN DISPOSABLE) ×3 IMPLANT
GOWN STRL REUS W/ TWL XL LVL3 (GOWN DISPOSABLE) ×4 IMPLANT
GOWN STRL REUS W/TWL XL LVL3 (GOWN DISPOSABLE) ×12
HANDPIECE INTERPULSE COAX TIP (DISPOSABLE) ×3
HOOD PEEL AWAY FLYTE STAYCOOL (MISCELLANEOUS) ×8 IMPLANT
INSERT TIBIAL KNEE BRG SZ4 9MM (Insert) ×2 IMPLANT
KNEE FEM COMP 4 RT (Joint) ×3 IMPLANT
MARKER SKIN DUAL TIP RULER LAB (MISCELLANEOUS) ×3 IMPLANT
NEEDLE SPNL 18GX3.5 QUINCKE PK (NEEDLE) ×3 IMPLANT
NS IRRIG 1000ML POUR BTL (IV SOLUTION) ×3 IMPLANT
PACK TOTAL KNEE CUSTOM (KITS) ×3 IMPLANT
PADDING CAST COTTON 6X4 STRL (CAST SUPPLIES) ×3 IMPLANT
PATELLA 32MMX10MM (Knees) ×3 IMPLANT
POSITIONER SURGICAL ARM (MISCELLANEOUS) ×3 IMPLANT
SAW OSC TIP CART 19.5X105X1.3 (SAW) ×3 IMPLANT
SEALER BIPOLAR AQUA 6.0 (INSTRUMENTS) ×3 IMPLANT
SET HNDPC FAN SPRY TIP SCT (DISPOSABLE) ×1 IMPLANT
SET PAD KNEE POSITIONER (MISCELLANEOUS) ×3 IMPLANT
SPONGE DRAIN TRACH 4X4 STRL 2S (GAUZE/BANDAGES/DRESSINGS) IMPLANT
SPONGE LAP 18X18 X RAY DECT (DISPOSABLE) IMPLANT
SUCTION FRAZIER HANDLE 12FR (TUBING) ×2
SUCTION TUBE FRAZIER 12FR DISP (TUBING) ×1 IMPLANT
SUT MNCRL AB 3-0 PS2 18 (SUTURE) ×3 IMPLANT
SUT MON AB 2-0 CT1 36 (SUTURE) ×6 IMPLANT
SUT STRATAFIX PDO 1 14 VIOLET (SUTURE) ×3
SUT STRATFX PDO 1 14 VIOLET (SUTURE) ×1
SUT VIC AB 1 CT1 36 (SUTURE) ×9 IMPLANT
SUT VIC AB 2-0 CT1 27 (SUTURE) ×3
SUT VIC AB 2-0 CT1 TAPERPNT 27 (SUTURE) ×1 IMPLANT
SUTURE STRATFX PDO 1 14 VIOLET (SUTURE) ×1 IMPLANT
SYR 50ML LL SCALE MARK (SYRINGE) ×3 IMPLANT
TOWER CARTRIDGE SMART MIX (DISPOSABLE) IMPLANT
TRAY FOLEY CATH 14FR (SET/KITS/TRAYS/PACK) ×3 IMPLANT
WATER STERILE IRR 1000ML POUR (IV SOLUTION) ×6 IMPLANT
WRAP KNEE MAXI GEL POST OP (GAUZE/BANDAGES/DRESSINGS) ×3 IMPLANT
YANKAUER SUCT BULB TIP 10FT TU (MISCELLANEOUS) ×3 IMPLANT

## 2018-03-24 NOTE — Evaluation (Signed)
Physical Therapy Evaluation Patient Details Name: Dawn Moon MRN: [id number] DOB: 03/14/1956 Today's Date: 03/24/2018   History of Present Illness  62 yo female s/p R TKA 03/24/18  Clinical Impression  On eval POD 0, pt required Min assist for mobility. She walked ~60 feet with a RW. Pain rated 5/10 with activity. Will follow and progress activity as tolerated. Plan is for possible d/c home tomorrow. Per chart, plan is for OP PT.     Follow Up Recommendations Follow surgeon's recommendation for DC plan and follow-up therapies    Equipment Recommendations  None recommended by PT    Recommendations for Other Services       Precautions / Restrictions Precautions Precautions: Fall Restrictions Weight Bearing Restrictions: No Other Position/Activity Restrictions: WBAT      Mobility  Bed Mobility Overal bed mobility: Needs Assistance Bed Mobility: Supine to Sit     Supine to sit: Min guard;HOB elevated     General bed mobility comments: close guard for safety. Increased time.   Transfers Overall transfer level: Needs assistance Equipment used: Rolling walker (2 wheeled) Transfers: Sit to/from Stand Sit to Stand: Min assist         General transfer comment: Assist to rise, stabilize, control descent. VCs safety, technique, hand/LE placement.   Ambulation/Gait Ambulation/Gait assistance: Min assist Ambulation Distance (Feet): 55 Feet Assistive device: Rolling walker (2 wheeled) Gait Pattern/deviations: Step-to pattern     General Gait Details: VCS safety, technique, sequence. Assist to stabilize throughout ambulation distance.   Stairs            Wheelchair Mobility    Modified Rankin (Stroke Patients Only)       Balance                                             Pertinent Vitals/Pain Pain Assessment: 0-10 Pain Score: 5  Pain Location: R knee Pain Descriptors / Indicators: Aching;Sore Pain Intervention(s): Limited  activity within patient's tolerance;Repositioned;Ice applied    Home Living Family/patient expects to be discharged to:: Private residence Living Arrangements: Spouse/significant other Available Help at Discharge: Family Type of Home: House Home Access: Stairs to enter Entrance Stairs-Rails: Psychiatric nurse of Steps: 3 Home Layout: One level Home Equipment: Environmental consultant - 2 wheels;Cane - single point      Prior Function Level of Independence: Independent               Hand Dominance        Extremity/Trunk Assessment        Lower Extremity Assessment Lower Extremity Assessment: Generalized weakness(s/p R TKA)    Cervical / Trunk Assessment Cervical / Trunk Assessment: Normal  Communication   Communication: No difficulties  Cognition Arousal/Alertness: Awake/alert Behavior During Therapy: WFL for tasks assessed/performed Overall Cognitive Status: Within Functional Limits for tasks assessed                                        General Comments      Exercises     Assessment/Plan    PT Assessment Patient needs continued PT services  PT Problem List Decreased strength;Decreased mobility;Decreased range of motion;Decreased activity tolerance;Decreased balance;Decreased knowledge of use of DME;Pain;Decreased knowledge of precautions       PT Treatment Interventions DME instruction;Gait training;Therapeutic exercise;Therapeutic  activities;Patient/family education;Balance training;Functional mobility training;Stair training    PT Goals (Current goals can be found in the Care Plan section)  Acute Rehab PT Goals Patient Stated Goal: regain plof.  PT Goal Formulation: With patient Time For Goal Achievement: 04/07/18 Potential to Achieve Goals: Good    Frequency 7X/week   Barriers to discharge        Co-evaluation               AM-PAC PT "6 Clicks" Daily Activity  Outcome Measure Difficulty turning over in bed  (including adjusting bedclothes, sheets and blankets)?: A Lot Difficulty moving from lying on back to sitting on the side of the bed? : A Lot Difficulty sitting down on and standing up from a chair with arms (e.g., wheelchair, bedside commode, etc,.)?: Unable Help needed moving to and from a bed to chair (including a wheelchair)?: A Little Help needed walking in hospital room?: A Little Help needed climbing 3-5 steps with a railing? : A Little 6 Click Score: 14    End of Session Equipment Utilized During Treatment: Gait belt Activity Tolerance: Patient tolerated treatment well Patient left: in chair;with call bell/phone within reach;with chair alarm set;with family/visitor present   PT Visit Diagnosis: Difficulty in walking, not elsewhere classified (R26.2);Other abnormalities of gait and mobility (R26.89);Pain Pain - Right/Left: Right Pain - part of body: Knee    Time: 0092-3300 PT Time Calculation (min) (ACUTE ONLY): 22 min   Charges:   PT Evaluation $PT Eval Low Complexity: 1 Low     PT G Codes:          Weston Anna, MPT Pager: [phone]

## 2018-03-24 NOTE — Anesthesia Postprocedure Evaluation (Signed)
Anesthesia Post Note  Patient: Dawn Moon  Procedure(s) Performed: RIGHT TOTAL KNEE ARTHROPLASTY WITH COMPUTER NAVIGATION (Right Hip)     Patient location during evaluation: PACU Anesthesia Type: Regional and Spinal Level of consciousness: oriented and awake and alert Pain management: pain level controlled Vital Signs Assessment: post-procedure vital signs reviewed and stable Respiratory status: spontaneous breathing, respiratory function stable and patient connected to nasal cannula oxygen Cardiovascular status: blood pressure returned to baseline and stable Postop Assessment: no headache, no backache and no apparent nausea or vomiting Anesthetic complications: no    Last Vitals:  Vitals:   03/24/18 1100 03/24/18 1154  BP:  (!) 143/80  Pulse:    Resp:    Temp: 37.1 C 36.7 C  SpO2:      Last Pain:  Vitals:   03/24/18 1154  TempSrc:   PainSc: 2     LLE Motor Response: Purposeful movement;Responds to commands (03/24/18 1154) LLE Sensation: Full sensation (03/24/18 1154) RLE Motor Response: Purposeful movement;Responds to commands (03/24/18 1154) RLE Sensation: Full sensation (03/24/18 1154) L Sensory Level: S1-Sole of foot, small toes (03/24/18 1154) R Sensory Level: S1-Sole of foot, small toes (03/24/18 1154)  Barnet [place]

## 2018-03-24 NOTE — Op Note (Signed)
OPERATIVE REPORT  SURGEON: Rod Can, MD   ASSISTANT: Nehemiah Massed, PA-C.  PREOPERATIVE DIAGNOSIS: Right knee arthritis.   POSTOPERATIVE DIAGNOSIS: Right knee arthritis.   PROCEDURE: Right total knee arthroplasty.   IMPLANTS: Stryker Triathlon CR femur, size 4. Stryker universal tibia, size 4. X3 polyethelyene insert, size 9 mm, CR. 3 button asymmetric patella, size 32 mm. Simplex P bone cement.  ANESTHESIA:  Regional and Spinal  TOURNIQUET TIME: 30 min at 366m Hg.  ESTIMATED BLOOD LOSS: -350 mL  ANTIBIOTICS: 1 g vancomycin.  DRAINS: Medium HV x1.  COMPLICATIONS: None   CONDITION: PACU - hemodynamically stable.   BRIEF CLINICAL NOTE: Dawn TUSINGis a 62y.o. female with a long-standing history of Right knee arthritis. After failing conservative management, the patient was indicated for total knee arthroplasty. The risks, benefits, and alternatives to the procedure were explained, and the patient elected to proceed.  PROCEDURE IN DETAIL: Adductor canal block was obtained in the pre-op holding area by  anesthesia . Once inside the operative room, spinal anesthesia was obtained, and a foley catheter was inserted. The patient was then positioned, a nonsterile tourniquet was placed, and the lower extremity was prepped and draped in the normal sterile surgical fashion. A time-out was called verifying side and site of surgery. The patient received IV antibiotics within 60 minutes of beginning the procedure.   An anterior approach to the knee was performed utilizing a midvastus arthrotomy. A medial release was performed and the patellar fat pad was excised. Stryker navigation was used to cut the distal femur perpendicular to the mechanical axis. A freehand patellar resection was performed, and the patella was sized an prepared with 3 lug holes.  Nagivation was used to make  a neutral proximal tibia resection, taking 4 mm of bone from the less affected lateral side with 3 degrees of slope. The menisci were excised. A spacer block was placed, and the alignment and balance in extension were confirmed.   The distal femur was sized using the 3-degree external rotation guide referencing the posterior femoral cortex. The appropriate 4-in-1 cutting block was pinned into place. Rotation was checked using Whiteside's line, the epicondylar axis, and then confirmed with a spacer block in flexion. The remaining femoral cuts were performed, taking care to protect the MCL.  The tibia was sized and the trial tray was pinned into place. The remaining trail components were inserted. The knee was stable to varus and valgus stress through a full range of motion. The patella tracked centrally, and the PCL was well balanced. The trial components were removed, and the proximal tibial surface was prepared. The extremity was exsanguinated with an Esmarch, and the tourniquet was inflated. Small drill holes were made in the sclerotic subchondral bone.The cut bony surfaces were irrigated with pulse lavage. Final components were cemented into place and excess cement was cleared. The trial insert was placed, and the knee was brought into extension while the cement polymerized. Once the cement was hard, the knee was tested for a final time and found to be well balanced. The trial insert was exchanged for the real polyethylene insert.  The wound was copiously irrigated with normal saline with pulse lavage. Marcaine solution was injected into the periarticular soft tissue. The wound was closed in layers using #1 Vicryl and Stratafix for the fascia, 2-0 Vicryl for the subcutaneous fat, 2-0 Monocryl for the deep dermal layer, 3-0 running Monocryl subcuticular Stitch, and Dermabond for the skin. Once the glue was fully dried, an  Aquacell Ag and compressive dressing were applied. The tourniquet was let down,  and the patient was transported to the recovery room in stable ondition. Sponge, needle, and instrument counts were correct at the end of the case x2. The patient tolerated the procedure well and there were no known complications.  Please note that a surgical assistant was a medical necessity for this procedure in order to perform it in a safe and expeditious manner. Surgical assistant was necessary to retract the ligaments and vital neurovascular structures to prevent injury to them and also necessary for proper positioning of the limb to allow for anatomic placement of the prosthesis.

## 2018-03-24 NOTE — Anesthesia Procedure Notes (Signed)
Date/Time: 03/24/2018 7:34 AM Performed by: Sharlette Dense, CRNA Oxygen Delivery Method: Simple face mask

## 2018-03-24 NOTE — Interval H&P Note (Signed)
History and Physical Interval Note:  03/24/2018 7:27 AM  Dawn Moon  has presented today for surgery, with the diagnosis of Degenerative joint disease right knee  The various methods of treatment have been discussed with the patient and family. After consideration of risks, benefits and other options for treatment, the patient has consented to  Procedure(s) with comments: RIGHT TOTAL KNEE ARTHROPLASTY WITH COMPUTER NAVIGATION (Right) - Needs RNFA as a surgical intervention .  The patient's history has been reviewed, patient examined, no change in status, stable for surgery.  I have reviewed the patient's chart and labs.  Questions were answered to the patient's satisfaction.     Hilton Cork Tishawna Larouche

## 2018-03-24 NOTE — Anesthesia Procedure Notes (Signed)
Spinal  Patient location during procedure: OB Start time: 03/24/2018 7:47 AM End time: 03/24/2018 7:59 AM Reason for block: at surgeon's request Staffing Anesthesiologist: Barnet Glasgow, MD Performed: anesthesiologist  Preanesthetic Checklist Completed: patient identified, surgical consent, pre-op evaluation, timeout performed, IV checked, risks and benefits discussed and monitors and equipment checked Spinal Block Patient position: sitting Prep: site prepped and draped and DuraPrep Patient monitoring: heart rate, cardiac monitor, continuous pulse ox and blood pressure Approach: midline Location: L3-4 Injection technique: single-shot Needle Needle type: Pencan  Needle gauge: 24 G Needle length: 10 cm Assessment Sensory level: T4

## 2018-03-24 NOTE — Anesthesia Procedure Notes (Signed)
Date/Time: 03/24/2018 6:43 AM Performed by: Sharlette Dense, CRNA Oxygen Delivery Method: Nasal cannula

## 2018-03-24 NOTE — Discharge Instructions (Signed)
Dr. Rod Can Total Joint Specialist [facility] [address], [place], [place] [address] [phone]  TOTAL KNEE REPLACEMENT POSTOPERATIVE DIRECTIONS    Knee Rehabilitation, Guidelines Following Surgery  Results after knee surgery are often greatly improved when you follow the exercise, range of motion and muscle strengthening exercises prescribed by your doctor. Safety measures are also important to protect the knee from further injury. Any time any of these exercises cause you to have increased pain or swelling in your knee joint, decrease the amount until you are comfortable again and slowly increase them. If you have problems or questions, call your caregiver or physical therapist for advice.   WEIGHT BEARING Weight bearing as tolerated with assist device (walker, cane, etc) as directed, use it as long as suggested by your surgeon or therapist, typically at least 4-6 weeks.  HOME CARE INSTRUCTIONS  Remove items at home which could result in a fall. This includes throw rugs or furniture in walking pathways.  Continue medications as instructed at time of discharge. You may have some home medications which will be placed on hold until you complete the course of blood thinner medication.  You may start showering once you are discharged home but do not submerge the incision under water. Just pat the incision dry and apply a dry gauze dressing on daily. Walk with walker as instructed.  You may resume a sexual relationship in one month or when given the OK by your doctor.   Use walker as long as suggested by your caregivers.  Avoid periods of inactivity such as sitting longer than an hour when not asleep. This helps prevent blood clots.  You may put full weight on your legs and walk as much as is comfortable.  You may return to work once you are cleared by your doctor.  Do not drive a car for 6 weeks or until released by you surgeon.   Do not drive  while taking narcotics.  Wear the elastic stockings for three weeks following surgery during the day but you may remove then at night. Make sure you keep all of your appointments after your operation with all of your doctors and caregivers. You should call the office at the above phone number and make an appointment for approximately two weeks after the date of your surgery. Do not remove your surgical dressing. The dressing is waterproof; you may take showers in 3 days, but do not take tub baths or submerge the dressing. Please pick up a stool softener and laxative for home use as long as you are requiring pain medications.  ICE to the affected knee every three hours for 30 minutes at a time and then as needed for pain and swelling.  Continue to use ice on the knee for pain and swelling from surgery. You may notice swelling that will progress down to the foot and ankle.  This is normal after surgery.  Elevate the leg when you are not up walking on it.   It is important for you to complete the blood thinner medication as prescribed by your doctor.  Continue to use the breathing machine which will help keep your temperature down.  It is common for your temperature to cycle up and down following surgery, especially at night when you are not up moving around and exerting yourself.  The breathing machine keeps your lungs expanded and your temperature down.  RANGE OF MOTION AND STRENGTHENING EXERCISES  Rehabilitation of the knee is important following  a knee injury or an operation. After just a few days of immobilization, the muscles of the thigh which control the knee become weakened and shrink (atrophy). Knee exercises are designed to build up the tone and strength of the thigh muscles and to improve knee motion. Often times heat used for twenty to thirty minutes before working out will loosen up your tissues and help with improving the range of motion but do not use heat for the first two weeks following  surgery. These exercises can be done on a training (exercise) mat, on the floor, on a table or on a bed. Use what ever works the best and is most comfortable for you Knee exercises include:  Leg Lifts - While your knee is still immobilized in a splint or cast, you can do straight leg raises. Lift the leg to 60 degrees, hold for 3 sec, and slowly lower the leg. Repeat 10-20 times 2-3 times daily. Perform this exercise against resistance later as your knee gets better.  Quad and Hamstring Sets - Tighten up the muscle on the front of the thigh (Quad) and hold for 5-10 sec. Repeat this 10-20 times hourly. Hamstring sets are done by pushing the foot backward against an object and holding for 5-10 sec. Repeat as with quad sets.  A rehabilitation program following serious knee injuries can speed recovery and prevent re-injury in the future due to weakened muscles. Contact your doctor or a physical therapist for more information on knee rehabilitation.   SKILLED REHAB INSTRUCTIONS: If the patient is transferred to a skilled rehab facility following release from the hospital, a list of the current medications will be sent to the facility for the patient to continue.  When discharged from the skilled rehab facility, please have the facility set up the patient's [place] prior to being released. Also, the skilled facility will be responsible for providing the patient with their medications at time of release from the facility to include their pain medication, the muscle relaxants, and their blood thinner medication. If the patient is still at the rehab facility at time of the two week follow up appointment, the skilled rehab facility will also need to assist the patient in arranging follow up appointment in our office and any transportation needs.  MAKE SURE YOU:  Understand these instructions.  Will watch your condition.  Will get help right away if you are not doing well or get worse.     Pick up stool softner and laxative for home use following surgery while on pain medications. Do NOT remove your dressing. You may shower.  Do not take tub baths or submerge incision under water. May shower starting three days after surgery. Please use a clean towel to pat the incision dry following showers. Continue to use ice for pain and swelling after surgery. Do not use any lotions or creams on the incision until instructed by your surgeon.

## 2018-03-24 NOTE — Anesthesia Procedure Notes (Addendum)
Anesthesia Regional Block: Adductor canal block   Pre-Anesthetic Checklist: ,, timeout performed, Correct Patient, Correct Site, Correct Laterality, Correct Procedure, Correct Position, site marked, Risks and benefits discussed,  Surgical consent,  Pre-op evaluation,  At surgeon's request and post-op pain management  Laterality: Right  Prep: chloraprep       Needles:  Injection technique: Single-shot      Additional Needles:   Procedures:,,,, ultrasound used (permanent image in chart),,,,  Narrative:  Start time: 03/24/2018 6:58 AM End time: 03/24/2018 7:06 AM Injection made incrementally with aspirations every 5 mL.  Performed by: Personally  Anesthesiologist: Barnet Glasgow, MD

## 2018-03-24 NOTE — Transfer of Care (Signed)
Immediate Anesthesia Transfer of Care Note  Patient: Dawn Moon  Procedure(s) Performed: RIGHT TOTAL KNEE ARTHROPLASTY WITH COMPUTER NAVIGATION (Right Hip)  Patient Location: PACU  Anesthesia Type:Spinal  Level of Consciousness: awake  Airway & Oxygen Therapy: Patient Spontanous Breathing and Patient connected to face mask oxygen  Post-op Assessment: Report given to RN and Post -op Vital signs reviewed and stable  Post vital signs: Reviewed and stable  Last Vitals:  Vitals Value Taken Time  BP 133/75 03/24/2018 10:57 AM  Temp    Pulse 88 03/24/2018 10:58 AM  Resp 15 03/24/2018 10:58 AM  SpO2 100 % 03/24/2018 10:58 AM  Vitals shown include unvalidated device data.  Last Pain:  Vitals:   03/24/18 0558  TempSrc:   PainSc: 0-No pain         Complications: No apparent anesthesia complications

## 2018-03-25 LAB — CBC
HCT: 29.6 % — ABNORMAL LOW (ref 36.0–46.0)
HEMOGLOBIN: 9.8 g/dL — AB (ref 12.0–15.0)
MCH: 29.3 pg (ref 26.0–34.0)
MCHC: 33.1 g/dL (ref 30.0–36.0)
MCV: 88.6 fL (ref 78.0–100.0)
Platelets: 112 10*3/uL — ABNORMAL LOW (ref 150–400)
RBC: 3.34 MIL/uL — ABNORMAL LOW (ref 3.87–5.11)
RDW: 15.9 % — ABNORMAL HIGH (ref 11.5–15.5)
WBC: 9.5 10*3/uL (ref 4.0–10.5)

## 2018-03-25 LAB — BASIC METABOLIC PANEL
Anion gap: 6 (ref 5–15)
BUN: 8 mg/dL (ref 6–20)
CHLORIDE: 111 mmol/L (ref 101–111)
CO2: 25 mmol/L (ref 22–32)
CREATININE: 0.64 mg/dL (ref 0.44–1.00)
Calcium: 7.8 mg/dL — ABNORMAL LOW (ref 8.9–10.3)
GFR calc Af Amer: >60 mL/min (ref >60)
GFR calc non Af Amer: >60 mL/min (ref >60)
GLUCOSE: 144 mg/dL — AB (ref 65–99)
Potassium: 3.8 mmol/L (ref 3.5–5.1)
Sodium: 142 mmol/L (ref 135–145)

## 2018-03-25 MED ORDER — ASPIRIN 81 MG PO CHEW: 81.0000 mg | CHEWABLE_TABLET | Freq: Two times a day (BID) | ORAL | 1 refills | Status: DC

## 2018-03-25 MED ORDER — OXYCODONE HCL ER 10 MG PO T12A: 10.0000 mg | EXTENDED_RELEASE_TABLET | ORAL | 0 refills | Status: DC

## 2018-03-25 MED ORDER — HYDROCODONE-ACETAMINOPHEN 5-325 MG PO TABS: 1.0000 | ORAL_TABLET | ORAL | 0 refills | Status: DC | PRN

## 2018-03-25 MED ORDER — SENNA 8.6 MG PO TABS: 2.0000 | ORAL_TABLET | Freq: Every day | ORAL | 3 refills | Status: DC

## 2018-03-25 MED ORDER — DOCUSATE SODIUM 100 MG PO CAPS: 100.0000 mg | ORAL_CAPSULE | Freq: Two times a day (BID) | ORAL | 1 refills | Status: DC

## 2018-03-25 NOTE — Progress Notes (Signed)
    Subjective:  Patient reports pain as mild to moderate.  Denies N/V/CP/SOB.   Objective:   VITALS:   Vitals:   03/24/18 2036 03/25/18 0007 03/25/18 0423 03/25/18 0909  BP: (!) 142/68 136/72 132/60 137/71  Pulse: 85 83 81 88  Resp: 15 15 15 16   Temp: 98.5 F (36.9 C) 98.2 F (36.8 C) 98.6 F (37 C) 97.7 F (36.5 C)  TempSrc: Oral Oral Oral Oral  SpO2: 98% 98% 98% 99%  Weight:      Height:        NAD ABD soft Sensation intact distally Intact pulses distally Dorsiflexion/Plantar flexion intact Incision: dressing C/D/I Compartment soft   Lab Results  Component Value Date   WBC 9.5 03/25/2018   HGB 9.8 (L) 03/25/2018   HCT 29.6 (L) 03/25/2018   MCV 88.6 03/25/2018   PLT 112 (L) 03/25/2018   BMET    Component Value Date/Time   NA 142 03/25/2018 0520   NA 143 01/20/2018 1148   K 3.8 03/25/2018 0520   CL 111 03/25/2018 0520   CO2 25 03/25/2018 0520   GLUCOSE 144 (H) 03/25/2018 0520   BUN 8 03/25/2018 0520   BUN 8 01/20/2018 1148   CREATININE 0.64 03/25/2018 0520   CALCIUM 7.8 (L) 03/25/2018 0520   GFRNONAA >60 03/25/2018 0520   GFRAA >60 03/25/2018 0520     Assessment/Plan: 1 Day Post-Op   Principal Problem:   Osteoarthritis of right knee   WBAT with walker DVT ppx: ASA, SCDs, TEDS PO pain control PT/OT Dispo: D/C home with outpatient PT - already set up    [place] 03/25/2018, 9:40 AM   Rod Can, MD Cell [phone]

## 2018-03-25 NOTE — Discharge Summary (Signed)
Physician Discharge Summary  Patient ID: Dawn Moon MRN: [id number] DOB/AGE: 62-Jun-1957 62 y.o.  Admit date: 03/24/2018 Discharge date: 03/25/2018  Admission Diagnoses:  Osteoarthritis of right knee  Discharge Diagnoses:  Principal Problem:   Osteoarthritis of right knee   Past Medical History:  Diagnosis Date  . GERD (gastroesophageal reflux disease)   . History of exercise stress test    03-07-2010  Stress echo--- no arrhythmias or conduction abnormalilites and negative for ischemia or chest pain  . History of kidney stones   . History of paroxysmal supraventricular tachycardia    episode 2011  consult w/ dr Caryl Comes --  put on atenolol--  per pt no longer an issue  . HTN (hypertension)    cardiologist --  dr Stanford Breed  . IBS (irritable bowel syndrome)    diarrhea  . Nonalcoholic steatohepatitis (NASH)   . Numbness and tingling of left lower extremity    post achilles tendon repair  . OA (osteoarthritis)    knees  . PONV (postoperative nausea and vomiting)   . Vitamin D deficiency   . Wears glasses     Surgeries: Procedure(s): RIGHT TOTAL KNEE ARTHROPLASTY WITH COMPUTER NAVIGATION on 03/24/2018   Consultants (if any):   Discharged Condition: Improved  Hospital Course: Dawn Moon is an 62 y.o. female who was admitted 03/24/2018 with a diagnosis of Osteoarthritis of right knee and went to the operating room on 03/24/2018 and underwent the above named procedures.    She was given perioperative antibiotics:  Anti-infectives (From admission, onward)   Start     Dose/Rate Route Frequency Ordered Stop   03/24/18 1800  vancomycin (VANCOCIN) IVPB 1000 mg/200 mL premix     1,000 mg 200 mL/hr over 60 Minutes Intravenous Every 12 hours 03/24/18 1226 03/24/18 1901   03/24/18 0600  vancomycin (VANCOCIN) IVPB 1000 mg/200 mL premix     1,000 mg 200 mL/hr over 60 Minutes Intravenous On call to O.R. 03/24/18 0537 03/24/18 0700   03/24/18 0545  vancomycin (VANCOCIN) IVPB 1000  mg/200 mL premix     1,000 mg 200 mL/hr over 60 Minutes Intravenous  Once 03/24/18 0537 03/24/18 0739    .  She was given sequential compression devices, early ambulation, and ASA for DVT prophylaxis.  She benefited maximally from the hospital stay and there were no complications.    Recent vital signs:  Vitals:   03/25/18 0909 03/25/18 1357  BP: 137/71 (!) 123/52  Pulse: 88 84  Resp: 16 16  Temp: 97.7 F (36.5 C) 97.7 F (36.5 C)  SpO2: 99% 100%    Recent laboratory studies:  Lab Results  Component Value Date   HGB 9.8 (L) 03/25/2018   HGB 13.6 03/18/2018   HGB 13.1 01/20/2018   Lab Results  Component Value Date   WBC 9.5 03/25/2018   PLT 112 (L) 03/25/2018   Lab Results  Component Value Date   INR 0.96 01/01/2014   Lab Results  Component Value Date   NA 142 03/25/2018   K 3.8 03/25/2018   CL 111 03/25/2018   CO2 25 03/25/2018   BUN 8 03/25/2018   CREATININE 0.64 03/25/2018   GLUCOSE 144 (H) 03/25/2018    Discharge Medications:   Allergies as of 03/25/2018   No Known Allergies     Medication List    STOP taking these medications   traMADol 50 MG tablet Commonly known as:  ULTRAM     TAKE these medications   aspirin 81 MG chewable  tablet Chew 1 tablet (81 mg total) by mouth 2 (two) times daily.   atenolol 50 MG tablet Commonly known as:  TENORMIN Take 1 tablet (50 mg total) by mouth every morning.   Butalbital-APAP-Caffeine 50-300-40 MG Caps Take 1 tablet by mouth every 8 (eight) hours as needed. What changed:  reasons to take this   docusate sodium 100 MG capsule Commonly known as:  COLACE Take 1 capsule (100 mg total) by mouth 2 (two) times daily.   Eluxadoline 75 MG Tabs Commonly known as:  VIBERZI Take 75 mg by mouth 2 (two) times daily.   furosemide 20 MG tablet Commonly known as:  LASIX Take 1 tablet (20 mg total) by mouth every morning. Patient taking up to TID prn What changed:    when to take this  additional  instructions   HYDROcodone-acetaminophen 5-325 MG tablet Commonly known as:  NORCO/VICODIN Take 1-2 tablets by mouth every 4 (four) hours as needed.   oxyCODONE 10 mg 12 hr tablet Commonly known as:  OXYCONTIN Take 1 tablet (10 mg total) by mouth PRO. 1 tab PO every 12 hours for 3 days, then 1 tab PO daily for 4 days   pantoprazole 40 MG tablet Commonly known as:  PROTONIX Take 1 tablet (40 mg total) by mouth every morning.   potassium citrate 10 MEQ (1080 MG) SR tablet Commonly known as:  UROCIT-K Take 1 tablet (10 mEq total) by mouth 2 (two) times daily. Taking up to TID What changed:    when to take this  additional instructions   promethazine 12.5 MG tablet Commonly known as:  PHENERGAN Take 12.5 mg by mouth every 6 (six) hours as needed for nausea or vomiting (per pt takes w/ tramadol to prevent nausea).   senna 8.6 MG Tabs tablet Commonly known as:  SENOKOT Take 2 tablets (17.2 mg total) by mouth at bedtime.       Diagnostic Studies: Dg Knee Right Port  Result Date: 03/24/2018 CLINICAL DATA:  Status post right total knee arthroplasty. EXAM: PORTABLE RIGHT KNEE - 1-2 VIEW COMPARISON:  None. FINDINGS: The femoral and tibial components appear to be well situated. No fracture or dislocation is noted. Expected postoperative changes are seen in the anterior soft tissues. IMPRESSION: Status post right total knee arthroplasty. Electronically Signed   By: Marijo Conception, M.D.   On: 03/24/2018 13:36    Disposition:    Discharge Instructions    Call MD / Call 911   Complete by:  As directed    If you experience chest pain or shortness of breath, CALL 911 and be transported to the hospital emergency room.  If you develope a fever above 101 F, pus (white drainage) or increased drainage or redness at the wound, or calf pain, call your surgeon's office.   Constipation Prevention   Complete by:  As directed    Drink plenty of fluids.  Prune juice may be helpful.  You may use a  stool softener, such as Colace (over the counter) 100 mg twice a day.  Use MiraLax (over the counter) for constipation as needed.   Diet - low sodium heart healthy   Complete by:  As directed    Do not put a pillow under the knee. Place it under the heel.   Complete by:  As directed    Driving restrictions   Complete by:  As directed    No driving for 6 weeks   Increase activity slowly as tolerated  Complete by:  As directed    Lifting restrictions   Complete by:  As directed    No lifting for 6 weeks   TED hose   Complete by:  As directed    Use stockings (TED hose) for 2 weeks on both leg(s).  You may remove them at night for sleeping.      Follow-up Information    Mckinzy Fuller, Aaron Edelman, MD. Schedule an appointment as soon as possible for a visit in 2 weeks.   Specialty:  Orthopedic Surgery Why:  For wound re-check Contact information: [address] [place] [address] [phone]            Signed: Hilton Cork Janaya Broy 03/26/2018, 8:22 AM

## 2018-03-25 NOTE — Progress Notes (Signed)
DC plan:  Pt has outpatient PT set up already and has RW and 3in1 at home. Marney Doctor RN,BSN,NCM [phone]

## 2018-03-25 NOTE — Progress Notes (Signed)
Pt instructed in removal of isol;ation precautions due to colonization. Pt completed 5 days of nasal antibiotic ointment required. 03-25-2018 Caffie Pinto RN

## 2018-03-25 NOTE — Progress Notes (Signed)
Pt instructed in opoid dependency/help sheet, verbalized understanding. Pt given prescription for oxycodone and other routine meds. Instructed pt in no driving/lifting x 6 weeks and of need to arrange appt with Dr Lyla Glassing within 2 weeks. Pt verb understanding of above and to seek medical assistance if fever/uncontrolled pain or any acute changes. 16:27 03-25-2018 Caffie Pinto RN

## 2018-03-25 NOTE — Progress Notes (Signed)
Physical Therapy Treatment Patient Details Name: Dawn Moon MRN: [id number] DOB: 1956-11-02 Today's Date: 03/25/2018    History of Present Illness 62 yo female s/p R TKA 03/24/18    PT Comments    Progressing well with mobility. Reviewed/praticed exercises, gait training, and stair training. Issued HEP for pt to perform 2x/day until she begins OP PT. All education completed. Okay to d/c from PT standpoint-made RN aware.     Follow Up Recommendations  Follow surgeon's recommendation for DC plan and follow-up therapies     Equipment Recommendations  None recommended by PT    Recommendations for Other Services       Precautions / Restrictions Precautions Precautions: Fall Restrictions Weight Bearing Restrictions: No Other Position/Activity Restrictions: WBAT    Mobility  Bed Mobility               General bed mobility comments: oob in recliner  Transfers Overall transfer level: Needs assistance Equipment used: Rolling walker (2 wheeled) Transfers: Sit to/from Stand Sit to Stand: Min guard         General transfer comment: close guard for safety. VCs hand placement  Ambulation/Gait Ambulation/Gait assistance: Min guard Ambulation Distance (Feet): 100 Feet Assistive device: Rolling walker (2 wheeled) Gait Pattern/deviations: Step-to pattern     General Gait Details: close guard for safety. VCs safety, sequence.    Stairs Stairs: Yes Min guard Assist  Stair Management: Two rails;Step to pattern;Forwards Number of Stairs: 2 General stair comments: VCs safety, sequence, technique. Close guard for safety.   Wheelchair Mobility    Modified Rankin (Stroke Patients Only)       Balance                                            Cognition Arousal/Alertness: Awake/alert Behavior During Therapy: WFL for tasks assessed/performed Overall Cognitive Status: Within Functional Limits for tasks assessed                                         Exercises Total Joint Exercises Ankle Circles/Pumps: AROM;Both;10 reps;Seated Quad Sets: AROM;Both;10 reps;Seated Hip ABduction/ADduction: AROM;Right;10 reps;Supine Straight Leg Raises: AROM;Right;10 reps;Supine Knee Flexion: AAROM;10 reps;Right;Seated Goniometric ROM: ~10-90 degrees    General Comments        Pertinent Vitals/Pain Pain Assessment: 0-10 Pain Score: 5  Pain Location: R knee with activity Pain Descriptors / Indicators: Aching;Sore Pain Intervention(s): Monitored during session;Repositioned;Ice applied    Home Living                      Prior Function            PT Goals (current goals can now be found in the care plan section) Progress towards PT goals: Progressing toward goals    Frequency    7X/week      PT Plan Current plan remains appropriate    Co-evaluation              AM-PAC PT "6 Clicks" Daily Activity  Outcome Measure  Difficulty turning over in bed (including adjusting bedclothes, sheets and blankets)?: A Little Difficulty moving from lying on back to sitting on the side of the bed? : A Little Difficulty sitting down on and standing up from a chair with arms (e.g., wheelchair, bedside  commode, etc,.)?: A Little Help needed moving to and from a bed to chair (including a wheelchair)?: A Little Help needed walking in hospital room?: A Little Help needed climbing 3-5 steps with a railing? : A Little 6 Click Score: 18    End of Session Equipment Utilized During Treatment: Gait belt Activity Tolerance: Patient tolerated treatment well Patient left: in chair;with call bell/phone within reach;with family/visitor present   PT Visit Diagnosis: Difficulty in walking, not elsewhere classified (R26.2);Other abnormalities of gait and mobility (R26.89);Pain Pain - Right/Left: Right Pain - part of body: Knee     Time: 3532-9924 PT Time Calculation (min) (ACUTE ONLY): 31 min  Charges:  $Gait  Training: 8-22 mins $Therapeutic Exercise: 8-22 mins                    G Codes:          Weston Anna, MPT Pager: [phone]

## 2018-03-28 ENCOUNTER — Ambulatory Visit: Payer: 59 | Admitting: Physical Therapy

## 2018-03-28 NOTE — Anesthesia Postprocedure Evaluation (Signed)
Anesthesia Post Note  Patient: Dawn Moon  Procedure(s) Performed: RIGHT TOTAL KNEE ARTHROPLASTY WITH COMPUTER NAVIGATION (Right Hip)     Patient location during evaluation: PACU Anesthesia Type: Regional and General Level of consciousness: awake and alert Pain management: pain level controlled Vital Signs Assessment: post-procedure vital signs reviewed and stable Respiratory status: spontaneous breathing, nonlabored ventilation, respiratory function stable and patient connected to nasal cannula oxygen Cardiovascular status: blood pressure returned to baseline and stable Postop Assessment: no apparent nausea or vomiting Anesthetic complications: no    Last Vitals:  Vitals:   03/25/18 0909 03/25/18 1357  BP: 137/71 (!) 123/52  Pulse: 88 84  Resp: 16 16  Temp: 36.5 C 36.5 C  SpO2: 99% 100%    Last Pain:  Vitals:   03/25/18 1357  TempSrc: Oral  PainSc:                  Barnet Glasgow

## 2018-03-28 NOTE — Addendum Note (Signed)
Addendum  created 03/28/18 1343 by Barnet Glasgow, MD   Intraprocedure Blocks edited, Sign clinical note

## 2018-03-29 ENCOUNTER — Other Ambulatory Visit: Payer: Self-pay

## 2018-03-29 ENCOUNTER — Ambulatory Visit: Payer: 59 | Attending: Orthopedic Surgery | Admitting: Physical Therapy

## 2018-03-29 ENCOUNTER — Encounter: Payer: Self-pay | Admitting: Physical Therapy

## 2018-03-29 DIAGNOSIS — G8929 Other chronic pain: Secondary | ICD-10-CM

## 2018-03-29 DIAGNOSIS — M6281 Muscle weakness (generalized): Secondary | ICD-10-CM | POA: Diagnosis present

## 2018-03-29 DIAGNOSIS — M25561 Pain in right knee: Secondary | ICD-10-CM | POA: Insufficient documentation

## 2018-03-29 DIAGNOSIS — M25661 Stiffness of right knee, not elsewhere classified: Secondary | ICD-10-CM | POA: Diagnosis present

## 2018-03-29 DIAGNOSIS — R600 Localized edema: Secondary | ICD-10-CM | POA: Diagnosis present

## 2018-03-29 NOTE — Therapy (Signed)
[facility] [place], [place], [address] Phone: [phone]   Fax:  [phone]  Physical Therapy Treatment  Patient Details  Name: Dawn Moon MRN: [id number] Date of Birth: May 03, 1956 Referring Provider: Rod Can MD   Encounter Date: 03/29/2018  PT End of Session - 03/29/18 1059    Visit Number  1    Number of Visits  18    Date for PT Re-Evaluation  05/10/18    Authorization Type  FOTO AT LEAST EVERY 5TH VISIT.    PT Start Time  1030    PT Stop Time  1113    PT Time Calculation (min)  43 min    Activity Tolerance  Patient tolerated treatment well    Behavior During Therapy  WFL for tasks assessed/performed       Past Medical History:  Diagnosis Date  . GERD (gastroesophageal reflux disease)   . History of exercise stress test    03-07-2010  Stress echo--- no arrhythmias or conduction abnormalilites and negative for ischemia or chest pain  . History of kidney stones   . History of paroxysmal supraventricular tachycardia    episode 2011  consult w/ dr Caryl Comes --  put on atenolol--  per pt no longer an issue  . HTN (hypertension)    cardiologist --  dr Stanford Breed  . IBS (irritable bowel syndrome)    diarrhea  . Nonalcoholic steatohepatitis (NASH)   . Numbness and tingling of left lower extremity    post achilles tendon repair  . OA (osteoarthritis)    knees  . PONV (postoperative nausea and vomiting)   . Vitamin D deficiency   . Wears glasses     Past Surgical History:  Procedure Laterality Date  . ACHILLES TENDON SURGERY Left 06/2016  . BREAST BIOPSY Left 02/2016   benign  . COLONOSCOPY  2005  . CT CTA CORONARY W/CA SCORE W/CM &/OR WO/CM  01/01/2014   non-obstructive calcified plaque in pLAD (0-25%), no significant incidental noncardiac findings noted  . ESOPHAGOGASTRODUODENOSCOPY  01/2014  . EXTRACORPOREAL SHOCK WAVE LITHOTRIPSY  2010  . KNEE ARTHROPLASTY Right 03/24/2018   Procedure: RIGHT  TOTAL KNEE ARTHROPLASTY WITH COMPUTER NAVIGATION;  Surgeon: Rod Can, MD;  Location: [facility];  Service: Orthopedics;  Laterality: Right;  Needs RNFA  . KNEE ARTHROSCOPY Right 12/04/2016   Procedure: ARTHROSCOPY KNEE WITH PARTIAL MEDIAL MENISCECTOMY;  Surgeon: Rod Can, MD;  Location: [place];  Service: Orthopedics;  Laterality: Right;  . LEFT HEART CATHETERIZATION WITH CORONARY ANGIOGRAM N/A 01/11/2012   Procedure: LEFT HEART CATHETERIZATION WITH CORONARY ANGIOGRAM;  Surgeon: Sherren Mocha, MD;  Location: [facility] CATH LAB;  Service: Cardiovascular;  Laterality: N/A;  widely patent coronary arterires without significant obstructive CAD,  normal LVF, ef 55-56%  . TONSILLECTOMY AND ADENOIDECTOMY  child  . TRANSTHORACIC ECHOCARDIOGRAM  01/18/2012   mild LVH,  ef 60%/  trivial TR  . TUBAL LIGATION  1985    There were no vitals filed for this visit.  Subjective Assessment - 03/29/18 1103    Subjective  The patient underwent a right total knee replacement on 03/24/18.  She is currently using a walker for safe ambulation and is compliant to her HEP.  Her resting pain-level is a 4/10 today and higher with range of motion activites.  Pain medication and ice decrease her pain.    Pertinent History  Left Achilles repair.    How long can you walk comfortably?  Short distances around house.  Patient Stated Goals  Get out of pain.    Pain Score  4     Pain Location  Knee    Pain Orientation  Right    Pain Descriptors / Indicators  Aching    Pain Type  Surgical pain    Pain Frequency  Constant         [facility] PT Assessment - 03/29/18 0001      Assessment   Medical Diagnosis  Right total knee replacement.    Referring Provider  Rod Can MD    Onset Date/Surgical Date  -- 03/24/18 (surgery date).      Precautions   Precautions  -- No ultrasound.      Restrictions   Weight Bearing Restrictions  No      Balance Screen   Has the patient fallen in the past 6 months  No     Has the patient had a decrease in activity level because of a fear of falling?   No    Is the patient reluctant to leave their home because of a fear of falling?   No      Home Environment   Living Environment  Private residence      Prior Function   Level of Independence  Independent      Observation/Other Assessments   Observations  Aquacel intact.      Observation/Other Assessments-Edema    Edema  -- 7 cms right > left mid-patellar position.      ROM / Strength   AROM / PROM / Strength  AROM;Strength      AROM   Overall AROM Comments  -10 degrees of right knee extension and -5 degrees passively with active right knee flexion= 70 degrees and passive to 75 degrees.      Strength   Overall Strength Comments  Right hip flexion and abduction= 3+/5 and right knee= 4-/5.      Palpation   Palpation comment  Tender to palpation all around patient's right knee anteriorly.      Bed Mobility   Bed Mobility  -- Independent.      Ambulation/Gait   Gait Comments  Very good pattern with a FWW.  Patient actually excessively externally rotates her left LE.                   [facility] Adult PT Treatment/Exercise - 03/29/18 0001      Exercises   Exercises  Knee/Hip      Knee/Hip Exercises: Aerobic   Nustep  Level 1 x 8 minutes moving forward x 1 to increase flexion.      Modalities   Modalities  Vasopneumatic      Vasopneumatic   Number Minutes Vasopneumatic   10 minutes    Vasopnuematic Location   -- Right.    Vasopneumatic Pressure  Low               PT Short Term Goals - 03/29/18 1128      PT SHORT TERM GOAL #1   Title  STG's=LTG's.        PT Long Term Goals - 03/29/18 1128      PT LONG TERM GOAL #1   Title  Ind with HEP.    Time  6    Period  Weeks    Status  New      PT LONG TERM GOAL #2   Title  Full right active knee extension in order to normalize gait.    Time  6    Period  Weeks    Status  New      PT LONG TERM GOAL #3   Title   Active knee flexion to 115 degrees+ so the patient can perform functional tasks and do so with pain not > 2-3/10.    Time  6    Period  Weeks    Status  New      PT LONG TERM GOAL #4   Title  Increase right knee strength to a solid 4+/5 to provide good stability for accomplishment of functional activities.    Time  6    Period  Weeks    Status  New      PT LONG TERM GOAL #5   Title  Decrease edema to within 2.5 cms of non-affected side to assist with pain reduction and range of motion gains.    Time  6    Period  Weeks    Status  New      PT LONG TERM GOAL #6   Title  Perform a reciprocating stair gait with one railing with pain not > 2-3/10.    Time  6    Period  Weeks    Status  New            Plan - 03/29/18 1139    Clinical Impression Statement  The patient underwent a right total knee replacement 5 days ago.  She has a significant loss of range of motion and a great deal of edema currently.  Her deficits and pain impair her functional mobility.  Patient will benefit from skilled PT with progression through a TKA protocol.    Clinical Presentation  Stable    Clinical Presentation due to:  Good surgical outcome.    Clinical Decision Making  Low    Rehab Potential  Excellent    PT Frequency  3x / week    PT Duration  6 weeks    PT Treatment/Interventions  ADLs/Self Care Home Management;Cryotherapy;Electrical Stimulation;Gait training;Stair training;Functional mobility training;Therapeutic activities;Therapeutic exercise;Neuromuscular re-education;Patient/family education;Passive range of motion;Manual techniques;Vasopneumatic Device    PT Next Visit Plan  TKA protocol.  Vasopneumatic and e'stim stim.    Consulted and Agree with Plan of Care  Patient       Patient will benefit from skilled therapeutic intervention in order to improve the following deficits and impairments:  Decreased activity tolerance, Decreased range of motion, Difficulty walking, Increased edema, Pain,  Decreased strength  Visit Diagnosis: Chronic pain of right knee  Stiffness of right knee, not elsewhere classified  Muscle weakness (generalized)  Localized edema     Problem List Patient Active Problem List   Diagnosis Date Noted  . Osteoarthritis of right knee 03/24/2018  . IBS (irritable bowel syndrome) 06/15/2017  . GERD (gastroesophageal reflux disease) 06/15/2017  . HLD (hyperlipidemia)   . Fatty liver   . NSVT (nonsustained ventricular tachycardia) ([place])   . Nephrolithiasis   . Essential hypertension 02/13/2010    APPLE[place], [place] MPT 03/29/2018, 12:04 PM  [facility] [address] [place], [place], [address] Phone: [phone]   Fax:  [phone]  Name: Dawn Moon MRN: [id number] Date of Birth: August 19, 1956

## 2018-03-31 ENCOUNTER — Ambulatory Visit: Payer: 59 | Admitting: Physical Therapy

## 2018-03-31 ENCOUNTER — Encounter: Payer: Self-pay | Admitting: Physical Therapy

## 2018-03-31 DIAGNOSIS — M25561 Pain in right knee: Secondary | ICD-10-CM | POA: Diagnosis not present

## 2018-03-31 DIAGNOSIS — R600 Localized edema: Secondary | ICD-10-CM

## 2018-03-31 DIAGNOSIS — M25661 Stiffness of right knee, not elsewhere classified: Secondary | ICD-10-CM

## 2018-03-31 DIAGNOSIS — M6281 Muscle weakness (generalized): Secondary | ICD-10-CM

## 2018-03-31 DIAGNOSIS — G8929 Other chronic pain: Secondary | ICD-10-CM

## 2018-03-31 NOTE — Therapy (Signed)
[facility] [place], [place], [address] Phone: [phone]   Fax:  [phone]  Physical Therapy Treatment  Patient Details  Name: Dawn Moon MRN: [id number] Date of Birth: 10/17/1956 Referring Provider: Rod Can MD   Encounter Date: 03/31/2018  PT End of Session - 03/31/18 1102    Visit Number  2    Number of Visits  18    Date for PT Re-Evaluation  05/10/18    Authorization Type  FOTO AT LEAST EVERY 5TH VISIT.    PT Start Time  0945    PT Stop Time  1042    PT Time Calculation (min)  57 min    Activity Tolerance  Patient tolerated treatment well    Behavior During Therapy  WFL for tasks assessed/performed       Past Medical History:  Diagnosis Date  . GERD (gastroesophageal reflux disease)   . History of exercise stress test    03-07-2010  Stress echo--- no arrhythmias or conduction abnormalilites and negative for ischemia or chest pain  . History of kidney stones   . History of paroxysmal supraventricular tachycardia    episode 2011  consult w/ dr Caryl Comes --  put on atenolol--  per pt no longer an issue  . HTN (hypertension)    cardiologist --  dr Stanford Breed  . IBS (irritable bowel syndrome)    diarrhea  . Nonalcoholic steatohepatitis (NASH)   . Numbness and tingling of left lower extremity    post achilles tendon repair  . OA (osteoarthritis)    knees  . PONV (postoperative nausea and vomiting)   . Vitamin D deficiency   . Wears glasses     Past Surgical History:  Procedure Laterality Date  . ACHILLES TENDON SURGERY Left 06/2016  . BREAST BIOPSY Left 02/2016   benign  . COLONOSCOPY  2005  . CT CTA CORONARY W/CA SCORE W/CM &/OR WO/CM  01/01/2014   non-obstructive calcified plaque in pLAD (0-25%), no significant incidental noncardiac findings noted  . ESOPHAGOGASTRODUODENOSCOPY  01/2014  . EXTRACORPOREAL SHOCK WAVE LITHOTRIPSY  2010  . KNEE ARTHROPLASTY Right 03/24/2018   Procedure: RIGHT  TOTAL KNEE ARTHROPLASTY WITH COMPUTER NAVIGATION;  Surgeon: Rod Can, MD;  Location: [facility];  Service: Orthopedics;  Laterality: Right;  Needs RNFA  . KNEE ARTHROSCOPY Right 12/04/2016   Procedure: ARTHROSCOPY KNEE WITH PARTIAL MEDIAL MENISCECTOMY;  Surgeon: Rod Can, MD;  Location: [place];  Service: Orthopedics;  Laterality: Right;  . LEFT HEART CATHETERIZATION WITH CORONARY ANGIOGRAM N/A 01/11/2012   Procedure: LEFT HEART CATHETERIZATION WITH CORONARY ANGIOGRAM;  Surgeon: Sherren Mocha, MD;  Location: [facility] CATH LAB;  Service: Cardiovascular;  Laterality: N/A;  widely patent coronary arterires without significant obstructive CAD,  normal LVF, ef 55-56%  . TONSILLECTOMY AND ADENOIDECTOMY  child  . TRANSTHORACIC ECHOCARDIOGRAM  01/18/2012   mild LVH,  ef 60%/  trivial TR  . TUBAL LIGATION  1985    There were no vitals filed for this visit.  Subjective Assessment - 03/31/18 1034    Subjective  No new complaints.    Pain Score  4     Pain Location  Knee    Pain Orientation  Right    Pain Descriptors / Indicators  Aching                       [facility] Adult PT Treatment/Exercise - 03/31/18 0001      Exercises   Exercises  Knee/Hip      Knee/Hip Exercises: Aerobic   Nustep  Level 1 x 15 minutes moving forward x 2 to increase knee flexion.      Modalities   Modalities  Vasopneumatic      Vasopneumatic   Number Minutes Vasopneumatic   20 minutes    Vasopnuematic Location   -- Right knee.    Vasopneumatic Pressure  Low      Manual Therapy   Manual Therapy  Passive ROM    Passive ROM  In supine:  PROM x 10 minutes into flexion and extension also included calf stretching and patellar mobility.             PT Education - 03/31/18 1059    Education provided  Yes    Education Details  Patient's husband present for instruct in PROM.    Person(s) Educated  Patient;Spouse    Methods  Explanation;Demonstration    Comprehension   Verbalized understanding       PT Short Term Goals - 03/29/18 1128      PT SHORT TERM GOAL #1   Title  STG's=LTG's.        PT Long Term Goals - 03/29/18 1128      PT LONG TERM GOAL #1   Title  Ind with HEP.    Time  6    Period  Weeks    Status  New      PT LONG TERM GOAL #2   Title  Full right active knee extension in order to normalize gait.    Time  6    Period  Weeks    Status  New      PT LONG TERM GOAL #3   Title  Active knee flexion to 115 degrees+ so the patient can perform functional tasks and do so with pain not > 2-3/10.    Time  6    Period  Weeks    Status  New      PT LONG TERM GOAL #4   Title  Increase right knee strength to a solid 4+/5 to provide good stability for accomplishment of functional activities.    Time  6    Period  Weeks    Status  New      PT LONG TERM GOAL #5   Title  Decrease edema to within 2.5 cms of non-affected side to assist with pain reduction and range of motion gains.    Time  6    Period  Weeks    Status  New      PT LONG TERM GOAL #6   Title  Perform a reciprocating stair gait with one railing with pain not > 2-3/10.    Time  6    Period  Weeks    Status  New            Plan - 03/31/18 1100    Clinical Impression Statement  The patient did very well today.  She states she is compliant to her HEP.  Her husband was present today for instruction in PROM.    PT Treatment/Interventions  ADLs/Self Care Home Management;Cryotherapy;Electrical Stimulation;Gait training;Stair training;Functional mobility training;Therapeutic activities;Therapeutic exercise;Neuromuscular re-education;Patient/family education;Passive range of motion;Manual techniques;Vasopneumatic Device    PT Next Visit Plan  TKA protocol.  Vasopneumatic and e'stim stim.    Consulted and Agree with Plan of Care  Patient       Patient will benefit from skilled therapeutic intervention in order to improve the following deficits  and impairments:     Visit  Diagnosis: Stiffness of right knee, not elsewhere classified  Chronic pain of right knee  Muscle weakness (generalized)  Localized edema     Problem List Patient Active Problem List   Diagnosis Date Noted  . Osteoarthritis of right knee 03/24/2018  . IBS (irritable bowel syndrome) 06/15/2017  . GERD (gastroesophageal reflux disease) 06/15/2017  . HLD (hyperlipidemia)   . Fatty liver   . NSVT (nonsustained ventricular tachycardia) ([place])   . Nephrolithiasis   . Essential hypertension 02/13/2010    Muzamil Harker, [place] MPT 03/31/2018, 11:04 AM  [facility] [address] [place], [place], [address] Phone: [phone]   Fax:  [phone]  Name: Dawn Moon MRN: [id number] Date of Birth: 03-21-1956

## 2018-04-01 ENCOUNTER — Ambulatory Visit: Payer: 59 | Admitting: Physical Therapy

## 2018-04-01 ENCOUNTER — Encounter: Payer: Self-pay | Admitting: Physical Therapy

## 2018-04-01 DIAGNOSIS — M25561 Pain in right knee: Secondary | ICD-10-CM | POA: Diagnosis not present

## 2018-04-01 DIAGNOSIS — M25661 Stiffness of right knee, not elsewhere classified: Secondary | ICD-10-CM

## 2018-04-01 DIAGNOSIS — R600 Localized edema: Secondary | ICD-10-CM

## 2018-04-01 DIAGNOSIS — M6281 Muscle weakness (generalized): Secondary | ICD-10-CM

## 2018-04-01 DIAGNOSIS — G8929 Other chronic pain: Secondary | ICD-10-CM

## 2018-04-01 NOTE — Therapy (Signed)
[facility] [place], [place], [address] Phone: [phone]   Fax:  [phone]  Physical Therapy Treatment  Patient Details  Name: Dawn Moon MRN: [id number] Date of Birth: 1956/05/30 Referring Provider: Rod Can MD   Encounter Date: 04/01/2018  PT End of Session - 04/01/18 0954    Visit Number  3    Number of Visits  18    Date for PT Re-Evaluation  05/10/18    Authorization Type  FOTO AT LEAST EVERY 5TH VISIT.    PT Start Time  [phone]    PT Stop Time  1036    PT Time Calculation (min)  49 min    Activity Tolerance  Patient tolerated treatment well    Behavior During Therapy  WFL for tasks assessed/performed       Past Medical History:  Diagnosis Date  . GERD (gastroesophageal reflux disease)   . History of exercise stress test    03-07-2010  Stress echo--- no arrhythmias or conduction abnormalilites and negative for ischemia or chest pain  . History of kidney stones   . History of paroxysmal supraventricular tachycardia    episode 2011  consult w/ dr Caryl Comes --  put on atenolol--  per pt no longer an issue  . HTN (hypertension)    cardiologist --  dr Stanford Breed  . IBS (irritable bowel syndrome)    diarrhea  . Nonalcoholic steatohepatitis (NASH)   . Numbness and tingling of left lower extremity    post achilles tendon repair  . OA (osteoarthritis)    knees  . PONV (postoperative nausea and vomiting)   . Vitamin D deficiency   . Wears glasses     Past Surgical History:  Procedure Laterality Date  . ACHILLES TENDON SURGERY Left 06/2016  . BREAST BIOPSY Left 02/2016   benign  . COLONOSCOPY  2005  . CT CTA CORONARY W/CA SCORE W/CM &/OR WO/CM  01/01/2014   non-obstructive calcified plaque in pLAD (0-25%), no significant incidental noncardiac findings noted  . ESOPHAGOGASTRODUODENOSCOPY  01/2014  . EXTRACORPOREAL SHOCK WAVE LITHOTRIPSY  2010  . KNEE ARTHROPLASTY Right 03/24/2018   Procedure: RIGHT  TOTAL KNEE ARTHROPLASTY WITH COMPUTER NAVIGATION;  Surgeon: Rod Can, MD;  Location: [facility];  Service: Orthopedics;  Laterality: Right;  Needs RNFA  . KNEE ARTHROSCOPY Right 12/04/2016   Procedure: ARTHROSCOPY KNEE WITH PARTIAL MEDIAL MENISCECTOMY;  Surgeon: Rod Can, MD;  Location: [place];  Service: Orthopedics;  Laterality: Right;  . LEFT HEART CATHETERIZATION WITH CORONARY ANGIOGRAM N/A 01/11/2012   Procedure: LEFT HEART CATHETERIZATION WITH CORONARY ANGIOGRAM;  Surgeon: Sherren Mocha, MD;  Location: [facility] CATH LAB;  Service: Cardiovascular;  Laterality: N/A;  widely patent coronary arterires without significant obstructive CAD,  normal LVF, ef 55-56%  . TONSILLECTOMY AND ADENOIDECTOMY  child  . TRANSTHORACIC ECHOCARDIOGRAM  01/18/2012   mild LVH,  ef 60%/  trivial TR  . TUBAL LIGATION  1985    There were no vitals filed for this visit.  Subjective Assessment - 04/01/18 0949    Subjective  Reports that she had a lot of pain yesterday and last night.    Pertinent History  Left Achilles repair.    How long can you walk comfortably?  Short distances around house.    Patient Stated Goals  Get out of pain.    Currently in Pain?  Yes    Pain Score  3     Pain Location  Knee    Pain  Orientation  Right    Pain Descriptors / Indicators  Sore    Pain Type  Surgical pain    Pain Onset  1 to 4 weeks ago    Aggravating Factors   Standing, prolonged positioning    Pain Relieving Factors  Movement, ice, prescribed meds         [facility] PT Assessment - 04/01/18 0001      Assessment   Medical Diagnosis  Right total knee replacement.    Onset Date/Surgical Date  03/24/18    Next MD Visit  04/06/2018      Restrictions   Weight Bearing Restrictions  No                   [facility] Adult PT Treatment/Exercise - 04/01/18 0001      Knee/Hip Exercises: Aerobic   Nustep  Level 1 x 15 minutes, seat 11      Modalities   Modalities  Electrical  Stimulation;Vasopneumatic      Electrical Stimulation   Electrical Stimulation Location  R knee    Electrical Stimulation Action  IFC    Electrical Stimulation Parameters  1-10 hz x15 min    Electrical Stimulation Goals  Pain;Edema      Vasopneumatic   Number Minutes Vasopneumatic   15 minutes    Vasopnuematic Location   Knee    Vasopneumatic Pressure  Low    Vasopneumatic Temperature   34      Manual Therapy   Manual Therapy  Passive ROM;Soft tissue mobilization    Soft tissue mobilization  R patella mobilizations to promote proper patella mobility    Passive ROM  PROM of R knee into flexion, ext with gentle holds at end range             PT Education - 03/31/18 1059    Education provided  Yes    Education Details  Patient's husband present for instruct in PROM.    Person(s) Educated  Patient;Spouse    Methods  Explanation;Demonstration    Comprehension  Verbalized understanding       PT Short Term Goals - 03/29/18 1128      PT SHORT TERM GOAL #1   Title  STG's=LTG's.        PT Long Term Goals - 03/29/18 1128      PT LONG TERM GOAL #1   Title  Ind with HEP.    Time  6    Period  Weeks    Status  New      PT LONG TERM GOAL #2   Title  Full right active knee extension in order to normalize gait.    Time  6    Period  Weeks    Status  New      PT LONG TERM GOAL #3   Title  Active knee flexion to 115 degrees+ so the patient can perform functional tasks and do so with pain not > 2-3/10.    Time  6    Period  Weeks    Status  New      PT LONG TERM GOAL #4   Title  Increase right knee strength to a solid 4+/5 to provide good stability for accomplishment of functional activities.    Time  6    Period  Weeks    Status  New      PT LONG TERM GOAL #5   Title  Decrease edema to within 2.5 cms of non-affected side to assist  with pain reduction and range of motion gains.    Time  6    Period  Weeks    Status  New      PT LONG TERM GOAL #6   Title   Perform a reciprocating stair gait with one railing with pain not > 2-3/10.    Time  6    Period  Weeks    Status  New            Plan - 04/01/18 1025    Clinical Impression Statement  Patient tolerated today's treatment well although still guarded secondary to swelling and pain. Patient experienced increased pain yesterday and into last night as well per patient  report. Patient presented with FWW and B TED hose donned.  Very gentle PROM completed into flexion and extension with patella mobilziations. Patient's R knee observed with increased edema throughout R knee. Normal modalities response noted following removal of the modalities.    Rehab Potential  Excellent    PT Frequency  3x / week    PT Duration  6 weeks    PT Treatment/Interventions  ADLs/Self Care Home Management;Cryotherapy;Electrical Stimulation;Gait training;Stair training;Functional mobility training;Therapeutic activities;Therapeutic exercise;Neuromuscular re-education;Patient/family education;Passive range of motion;Manual techniques;Vasopneumatic Device    PT Next Visit Plan  TKA protocol.  Vasopneumatic and e'stim stim.    Consulted and Agree with Plan of Care  Patient       Patient will benefit from skilled therapeutic intervention in order to improve the following deficits and impairments:  Decreased activity tolerance, Decreased range of motion, Difficulty walking, Increased edema, Pain, Decreased strength  Visit Diagnosis: Chronic pain of right knee  Stiffness of right knee, not elsewhere classified  Muscle weakness (generalized)  Localized edema     Problem List Patient Active Problem List   Diagnosis Date Noted  . Osteoarthritis of right knee 03/24/2018  . IBS (irritable bowel syndrome) 06/15/2017  . GERD (gastroesophageal reflux disease) 06/15/2017  . HLD (hyperlipidemia)   . Fatty liver   . NSVT (nonsustained ventricular tachycardia) ([place])   . Nephrolithiasis   . Essential hypertension  02/13/2010    Standley Brooking, PTA 04/01/2018, 10:46 AM  [facility] [address] [place], [place], [address] Phone: [phone]   Fax:  [phone]  Name: AMYLIA COLLAZOS MRN: [id number] Date of Birth: January 22, 1956

## 2018-04-04 ENCOUNTER — Encounter: Payer: 59 | Admitting: Physical Therapy

## 2018-04-05 ENCOUNTER — Ambulatory Visit: Payer: 59 | Admitting: Physical Therapy

## 2018-04-05 ENCOUNTER — Encounter: Payer: Self-pay | Admitting: Physical Therapy

## 2018-04-05 DIAGNOSIS — M25661 Stiffness of right knee, not elsewhere classified: Secondary | ICD-10-CM

## 2018-04-05 DIAGNOSIS — G8929 Other chronic pain: Secondary | ICD-10-CM

## 2018-04-05 DIAGNOSIS — M25561 Pain in right knee: Principal | ICD-10-CM

## 2018-04-05 DIAGNOSIS — R600 Localized edema: Secondary | ICD-10-CM

## 2018-04-05 DIAGNOSIS — M6281 Muscle weakness (generalized): Secondary | ICD-10-CM

## 2018-04-05 NOTE — Therapy (Signed)
[facility] [place], [place], [address] Phone: [phone]   Fax:  [phone]  Physical Therapy Evaluation  Patient Details  Name: Dawn Moon MRN: [id number] Date of Birth: 1956/10/21 Referring Provider: Rod Can MD   Encounter Date: 04/05/2018  PT End of Session - 04/05/18 1107    Visit Number  4    Number of Visits  18    Date for PT Re-Evaluation  05/10/18    Authorization Type  FOTO AT LEAST EVERY 5TH VISIT.    PT Start Time  0945    PT Stop Time  1042    PT Time Calculation (min)  57 min    Activity Tolerance  Patient tolerated treatment well    Behavior During Therapy  WFL for tasks assessed/performed       Past Medical History:  Diagnosis Date  . GERD (gastroesophageal reflux disease)   . History of exercise stress test    03-07-2010  Stress echo--- no arrhythmias or conduction abnormalilites and negative for ischemia or chest pain  . History of kidney stones   . History of paroxysmal supraventricular tachycardia    episode 2011  consult w/ dr Caryl Comes --  put on atenolol--  per pt no longer an issue  . HTN (hypertension)    cardiologist --  dr Stanford Breed  . IBS (irritable bowel syndrome)    diarrhea  . Nonalcoholic steatohepatitis (NASH)   . Numbness and tingling of left lower extremity    post achilles tendon repair  . OA (osteoarthritis)    knees  . PONV (postoperative nausea and vomiting)   . Vitamin D deficiency   . Wears glasses     Past Surgical History:  Procedure Laterality Date  . ACHILLES TENDON SURGERY Left 06/2016  . BREAST BIOPSY Left 02/2016   benign  . COLONOSCOPY  2005  . CT CTA CORONARY W/CA SCORE W/CM &/OR WO/CM  01/01/2014   non-obstructive calcified plaque in pLAD (0-25%), no significant incidental noncardiac findings noted  . ESOPHAGOGASTRODUODENOSCOPY  01/2014  . EXTRACORPOREAL SHOCK WAVE LITHOTRIPSY  2010  . KNEE ARTHROPLASTY Right 03/24/2018   Procedure: RIGHT  TOTAL KNEE ARTHROPLASTY WITH COMPUTER NAVIGATION;  Surgeon: Rod Can, MD;  Location: [facility];  Service: Orthopedics;  Laterality: Right;  Needs RNFA  . KNEE ARTHROSCOPY Right 12/04/2016   Procedure: ARTHROSCOPY KNEE WITH PARTIAL MEDIAL MENISCECTOMY;  Surgeon: Rod Can, MD;  Location: [place];  Service: Orthopedics;  Laterality: Right;  . LEFT HEART CATHETERIZATION WITH CORONARY ANGIOGRAM N/A 01/11/2012   Procedure: LEFT HEART CATHETERIZATION WITH CORONARY ANGIOGRAM;  Surgeon: Sherren Mocha, MD;  Location: [facility] C[facility];  Service: Cardiovascular;  Laterality: N/A;  widely patent coronary arterires without significant obstructive CAD,  normal LVF, ef 55-56%  . TONSILLECTOMY AND ADENOIDECTOMY  child  . TRANSTHORACIC ECHOCARDIOGRAM  01/18/2012   mild LVH,  ef 60%/  trivial TR  . TUBAL LIGATION  1985    There were no vitals filed for this visit.   Subjective Assessment - 04/05/18 1103    Subjective  I'm okay.      Currently in Pain?  Yes    Pain Score  3     Pain Location  Knee    Pain Orientation  Right    Pain Type  Surgical pain                    Objective measurements completed on examination: See above findings.  [facility] Adult PT Treatment/Exercise - 04/05/18 0001      Exercises   Exercises  Knee/Hip;Ankle      Knee/Hip Exercises: Aerobic   Nustep  Level 1 x 15 minutes moving forward x 2 to increase flexion.      Knee/Hip Exercises: Standing   Other Standing Knee Exercises  14 inch box lunges x 3 minutes.      Modalities   Modalities  Vasopneumatic      Vasopneumatic   Number Minutes Vasopneumatic   15 minutes    Vasopnuematic Location   -- Right knee.    Vasopneumatic Pressure  Low      Manual Therapy   Passive ROM  PROM to patient tolerance into flexion x 3 minutes.      Ankle Exercises: Standing   Rocker Board Limitations  Right calf stretching on rockerboard x 3 minutes.               PT Short Term Goals -  03/29/18 1128      PT SHORT TERM GOAL #1   Title  STG's=LTG's.        PT Long Term Goals - 03/29/18 1128      PT LONG TERM GOAL #1   Title  Ind with HEP.    Time  6    Period  Weeks    Status  New      PT LONG TERM GOAL #2   Title  Full right active knee extension in order to normalize gait.    Time  6    Period  Weeks    Status  New      PT LONG TERM GOAL #3   Title  Active knee flexion to 115 degrees+ so the patient can perform functional tasks and do so with pain not > 2-3/10.    Time  6    Period  Weeks    Status  New      PT LONG TERM GOAL #4   Title  Increase right knee strength to a solid 4+/5 to provide good stability for accomplishment of functional activities.    Time  6    Period  Weeks    Status  New      PT LONG TERM GOAL #5   Title  Decrease edema to within 2.5 cms of non-affected side to assist with pain reduction and range of motion gains.    Time  6    Period  Weeks    Status  New      PT LONG TERM GOAL #6   Title  Perform a reciprocating stair gait with one railing with pain not > 2-3/10.    Time  6    Period  Weeks    Status  New             Plan - 04/05/18 1107    Clinical Impression Statement  Patient did well today.  Current ROM is -10 to 90 degrees.       Patient will benefit from skilled therapeutic intervention in order to improve the following deficits and impairments:     Visit Diagnosis: Chronic pain of right knee  Stiffness of right knee, not elsewhere classified  Muscle weakness (generalized)  Localized edema     Problem List Patient Active Problem List   Diagnosis Date Noted  . Osteoarthritis of right knee 03/24/2018  . IBS (irritable bowel syndrome) 06/15/2017  . GERD (gastroesophageal reflux disease) 06/15/2017  . HLD (hyperlipidemia)   .  Fatty liver   . NSVT (nonsustained ventricular tachycardia) ([place])   . Nephrolithiasis   . Essential hypertension 02/13/2010    Dawn Moon, [place] MPT 04/05/2018,  11:09 AM  [facility] [address] [place], [place], [address] Phone: [phone]   Fax:  [phone]  Name: Dawn Moon MRN: [id number] Date of Birth: 1956/10/07

## 2018-04-06 ENCOUNTER — Ambulatory Visit: Payer: 59 | Admitting: Physical Therapy

## 2018-04-06 DIAGNOSIS — R600 Localized edema: Secondary | ICD-10-CM

## 2018-04-06 DIAGNOSIS — G8929 Other chronic pain: Secondary | ICD-10-CM

## 2018-04-06 DIAGNOSIS — M25661 Stiffness of right knee, not elsewhere classified: Secondary | ICD-10-CM

## 2018-04-06 DIAGNOSIS — M25561 Pain in right knee: Principal | ICD-10-CM

## 2018-04-06 DIAGNOSIS — M6281 Muscle weakness (generalized): Secondary | ICD-10-CM

## 2018-04-06 NOTE — Therapy (Signed)
[facility] [place], [place], [address] Phone: [phone]   Fax:  [phone]  Physical Therapy Treatment  Patient Details  Name: Dawn Moon MRN: [id number] Date of Birth: July 31, 1956 Referring Provider: Rod Can MD   Encounter Date: 04/06/2018  PT End of Session - 04/06/18 1001    Visit Number  5    Number of Visits  18    Date for PT Re-Evaluation  05/10/18    Authorization Type  FOTO AT LEAST EVERY 5TH VISIT.    PT Start Time  [phone]    PT Stop Time  1042    PT Time Calculation (min)  55 min    Activity Tolerance  Patient tolerated treatment well    Behavior During Therapy  WFL for tasks assessed/performed       Past Medical History:  Diagnosis Date  . GERD (gastroesophageal reflux disease)   . History of exercise stress test    03-07-2010  Stress echo--- no arrhythmias or conduction abnormalilites and negative for ischemia or chest pain  . History of kidney stones   . History of paroxysmal supraventricular tachycardia    episode 2011  consult w/ dr Caryl Comes --  put on atenolol--  per pt no longer an issue  . HTN (hypertension)    cardiologist --  dr Stanford Breed  . IBS (irritable bowel syndrome)    diarrhea  . Nonalcoholic steatohepatitis (NASH)   . Numbness and tingling of left lower extremity    post achilles tendon repair  . OA (osteoarthritis)    knees  . PONV (postoperative nausea and vomiting)   . Vitamin D deficiency   . Wears glasses     Past Surgical History:  Procedure Laterality Date  . ACHILLES TENDON SURGERY Left 06/2016  . BREAST BIOPSY Left 02/2016   benign  . COLONOSCOPY  2005  . CT CTA CORONARY W/CA SCORE W/CM &/OR WO/CM  01/01/2014   non-obstructive calcified plaque in pLAD (0-25%), no significant incidental noncardiac findings noted  . ESOPHAGOGASTRODUODENOSCOPY  01/2014  . EXTRACORPOREAL SHOCK WAVE LITHOTRIPSY  2010  . KNEE ARTHROPLASTY Right 03/24/2018   Procedure: RIGHT  TOTAL KNEE ARTHROPLASTY WITH COMPUTER NAVIGATION;  Surgeon: Rod Can, MD;  Location: [facility];  Service: Orthopedics;  Laterality: Right;  Needs RNFA  . KNEE ARTHROSCOPY Right 12/04/2016   Procedure: ARTHROSCOPY KNEE WITH PARTIAL MEDIAL MENISCECTOMY;  Surgeon: Rod Can, MD;  Location: [facility];  Service: Orthopedics;  Laterality: Right;  . LEFT HEART CATHETERIZATION WITH CORONARY ANGIOGRAM N/A 01/11/2012   Procedure: LEFT HEART CATHETERIZATION WITH CORONARY ANGIOGRAM;  Surgeon: Sherren Mocha, MD;  Location: [facility] CATH LAB;  Service: Cardiovascular;  Laterality: N/A;  widely patent coronary arterires without significant obstructive CAD,  normal LVF, ef 55-56%  . TONSILLECTOMY AND ADENOIDECTOMY  child  . TRANSTHORACIC ECHOCARDIOGRAM  01/18/2012   mild LVH,  ef 60%/  trivial TR  . TUBAL LIGATION  1985    There were no vitals filed for this visit.  Subjective Assessment - 04/06/18 0952    Subjective  Reports that her husband donned a thigh high TED hose to her RLE  and she had her sister to remove it secondary to uncomfortable and intolerable.    Pertinent History  Left Achilles repair.    How long can you walk comfortably?  Short distances around house.    Patient Stated Goals  Get out of pain.    Currently in Pain?  Yes    Pain  Score  3     Pain Location  Knee    Pain Orientation  Right    Pain Descriptors / Indicators  Sore;Other (Comment) hot, itchy    Pain Type  Surgical pain    Pain Onset  1 to 4 weeks ago         [facility] PT Assessment - 04/06/18 0001      Assessment   Medical Diagnosis  Right total knee replacement.    Onset Date/Surgical Date  03/24/18    Next MD Visit  04/06/2018      Restrictions   Weight Bearing Restrictions  No                   [facility] Adult PT Treatment/Exercise - 04/06/18 0001      Knee/Hip Exercises: Stretches   Gastroc Stretch  Right;3 reps;30 seconds      Knee/Hip Exercises: Aerobic   Nustep  L3 x15 min       Knee/Hip Exercises: Standing   Forward Lunges  Right;15 reps;5 seconds    Hip Abduction  AROM;Right;15 reps;Knee straight      Modalities   Modalities  Psychologist, educational Location  R knee    Electrical Stimulation Action  IFC    Electrical Stimulation Parameters  1-10 hz x15 min    Electrical Stimulation Goals  Pain;Edema      Vasopneumatic   Number Minutes Vasopneumatic   15 minutes    Vasopnuematic Location   Knee    Vasopneumatic Pressure  Low    Vasopneumatic Temperature   34      Manual Therapy   Manual Therapy  Passive ROM    Passive ROM  PROM of R knee into flexion and extension gently with oscillations to promote relaxation               PT Short Term Goals - 03/29/18 1128      PT SHORT TERM GOAL #1   Title  STG's=LTG's.        PT Long Term Goals - 03/29/18 1128      PT LONG TERM GOAL #1   Title  Ind with HEP.    Time  6    Period  Weeks    Status  New      PT LONG TERM GOAL #2   Title  Full right active knee extension in order to normalize gait.    Time  6    Period  Weeks    Status  New      PT LONG TERM GOAL #3   Title  Active knee flexion to 115 degrees+ so the patient can perform functional tasks and do so with pain not > 2-3/10.    Time  6    Period  Weeks    Status  New      PT LONG TERM GOAL #4   Title  Increase right knee strength to a solid 4+/5 to provide good stability for accomplishment of functional activities.    Time  6    Period  Weeks    Status  New      PT LONG TERM GOAL #5   Title  Decrease edema to within 2.5 cms of non-affected side to assist with pain reduction and range of motion gains.    Time  6    Period  Weeks    Status  New  PT LONG TERM GOAL #6   Title  Perform a reciprocating stair gait with one railing with pain not > 2-3/10.    Time  6    Period  Weeks    Status  New            Plan - 04/06/18 1051    Clinical  Impression Statement  Patient tolerated today's treatment fairly well as she arrived with reports of discomfort and wanting to get the post surgical bandage removed. Patient had B knee hight TED hose donned and continues to use FWW for ambulation. Patient able to slowly complete exercises for ROM and stretching as directed. Patient limited with PROM secondary to pain and R knee observed with increased inflammation throughout today. Patient reports compliance with icing as directed. Normal modalities response noted following removal of the modalities.    Rehab Potential  Excellent    PT Frequency  3x / week    PT Duration  6 weeks    PT Treatment/Interventions  ADLs/Self Care Home Management;Cryotherapy;Electrical Stimulation;Gait training;Stair training;Functional mobility training;Therapeutic activities;Therapeutic exercise;Neuromuscular re-education;Patient/family education;Passive range of motion;Manual techniques;Vasopneumatic Device    PT Next Visit Plan  TKA protocol.  Vasopneumatic and e'stim stim.    Consulted and Agree with Plan of Care  Patient       Patient will benefit from skilled therapeutic intervention in order to improve the following deficits and impairments:  Decreased activity tolerance, Decreased range of motion, Difficulty walking, Increased edema, Pain, Decreased strength  Visit Diagnosis: Chronic pain of right knee  Stiffness of right knee, not elsewhere classified  Muscle weakness (generalized)  Localized edema     Problem List Patient Active Problem List   Diagnosis Date Noted  . Osteoarthritis of right knee 03/24/2018  . IBS (irritable bowel syndrome) 06/15/2017  . GERD (gastroesophageal reflux disease) 06/15/2017  . HLD (hyperlipidemia)   . Fatty liver   . NSVT (nonsustained ventricular tachycardia) ([place])   . Nephrolithiasis   . Essential hypertension 02/13/2010    Standley Brooking, PTA 04/06/2018, 10:55 AM  [facility] [address] [place], [place], [address] Phone: [phone]   Fax:  [phone]  Name: Dawn Moon MRN: [id number] Date of Birth: [birth year]

## 2018-04-11 ENCOUNTER — Ambulatory Visit: Payer: 59 | Admitting: Physical Therapy

## 2018-04-11 ENCOUNTER — Encounter: Payer: Self-pay | Admitting: Physical Therapy

## 2018-04-11 DIAGNOSIS — M6281 Muscle weakness (generalized): Secondary | ICD-10-CM

## 2018-04-11 DIAGNOSIS — M25561 Pain in right knee: Secondary | ICD-10-CM | POA: Diagnosis not present

## 2018-04-11 DIAGNOSIS — G8929 Other chronic pain: Secondary | ICD-10-CM

## 2018-04-11 DIAGNOSIS — M25661 Stiffness of right knee, not elsewhere classified: Secondary | ICD-10-CM

## 2018-04-11 DIAGNOSIS — R600 Localized edema: Secondary | ICD-10-CM

## 2018-04-11 NOTE — Therapy (Signed)
[facility] [place], [place], [address] Phone: [phone]   Fax:  [phone]  Physical Therapy Treatment  Patient Details  Name: Dawn Moon MRN: [id number] Date of Birth: [birth year] Referring Provider: Rod Can MD   Encounter Date: 04/11/2018  PT End of Session - 04/11/18 0828    Visit Number  6    Number of Visits  18    Date for PT Re-Evaluation  05/10/18    Authorization Type  FOTO AT LEAST EVERY 5TH VISIT.    PT Start Time  0815    PT Stop Time  0911    PT Time Calculation (min)  56 min    Activity Tolerance  Patient tolerated treatment well    Behavior During Therapy  [facility] for tasks assessed/performed       Past Medical History:  Diagnosis Date  . GERD (gastroesophageal reflux disease)   . History of exercise stress test    03-07-2010  Stress echo--- no arrhythmias or conduction abnormalilites and negative for ischemia or chest pain  . History of kidney stones   . History of paroxysmal supraventricular tachycardia    episode 2011  consult w/ dr Caryl Comes --  put on atenolol--  per pt no longer an issue  . HTN (hypertension)    cardiologist --  dr Stanford Breed  . IBS (irritable bowel syndrome)    diarrhea  . Nonalcoholic steatohepatitis (NASH)   . Numbness and tingling of left lower extremity    post achilles tendon repair  . OA (osteoarthritis)    knees  . PONV (postoperative nausea and vomiting)   . Vitamin D deficiency   . Wears glasses     Past Surgical History:  Procedure Laterality Date  . ACHILLES TENDON SURGERY Left 06/2016  . BREAST BIOPSY Left 02/2016   benign  . COLONOSCOPY  2005  . CT CTA CORONARY W/CA SCORE W/CM &/OR WO/CM  01/01/2014   non-obstructive calcified plaque in pLAD (0-25%), no significant incidental noncardiac findings noted  . ESOPHAGOGASTRODUODENOSCOPY  01/2014  . EXTRACORPOREAL SHOCK WAVE LITHOTRIPSY  2010  . KNEE ARTHROPLASTY Right 03/24/2018   Procedure: RIGHT  TOTAL KNEE ARTHROPLASTY WITH COMPUTER NAVIGATION;  Surgeon: Rod Can, MD;  Location: [facility];  Service: Orthopedics;  Laterality: Right;  Needs RNFA  . KNEE ARTHROSCOPY Right 12/04/2016   Procedure: ARTHROSCOPY KNEE WITH PARTIAL MEDIAL MENISCECTOMY;  Surgeon: Rod Can, MD;  Location: [place];  Service: Orthopedics;  Laterality: Right;  . LEFT HEART CATHETERIZATION WITH CORONARY ANGIOGRAM N/A 01/11/2012   Procedure: LEFT HEART CATHETERIZATION WITH CORONARY ANGIOGRAM;  Surgeon: Sherren Mocha, MD;  Location: [facility];  Service: Cardiovascular;  Laterality: N/A;  widely patent coronary arterires without significant obstructive CAD,  normal LVF, ef 55-56%  . TONSILLECTOMY AND ADENOIDECTOMY  child  . TRANSTHORACIC ECHOCARDIOGRAM  01/18/2012   mild LVH,  ef 60%/  trivial TR  . TUBAL LIGATION  1985    There were no vitals filed for this visit.  Subjective Assessment - 04/11/18 0827    Subjective  Reports that MD wants her to elevate her knee as well as gave her new prescription for pain medication.    Pertinent History  Left Achilles repair.    How long can you walk comfortably?  Short distances around house.    Patient Stated Goals  Get out of pain.    Currently in Pain?  Yes    Pain Score  2     Pain  Location  Knee    Pain Orientation  Right    Pain Descriptors / Indicators  Discomfort    Pain Type  Surgical pain    Pain Onset  1 to 4 weeks ago    Pain Frequency  Constant         [facility] PT Assessment - 04/11/18 0001      Assessment   Medical Diagnosis  Right total knee replacement.    Onset Date/Surgical Date  03/24/18      Restrictions   Weight Bearing Restrictions  No                   [facility] Adult PT Treatment/Exercise - 04/11/18 0001      Knee/Hip Exercises: Stretches   Gastroc Stretch  Right;3 reps;30 seconds      Knee/Hip Exercises: Aerobic   Nustep  L3 x18 min      Knee/Hip Exercises: Standing   Forward Lunges  Right;10  reps;5 seconds    Hip Abduction  AROM;Right;15 reps;Knee straight      Modalities   Modalities  Psychologist, educational Location  R knee    Electrical Stimulation Action  IFC    Electrical Stimulation Parameters  1-10 hz x15 min    Electrical Stimulation Goals  Pain;Edema      Vasopneumatic   Number Minutes Vasopneumatic   15 minutes    Vasopnuematic Location   Knee    Vasopneumatic Pressure  Low    Vasopneumatic Temperature   34      Manual Therapy   Manual Therapy  Passive ROM    Passive ROM  PROM of R knee into flexion and extension gently with oscillations to promote relaxation               PT Short Term Goals - 03/29/18 1128      PT SHORT TERM GOAL #1   Title  STG's=LTG's.        PT Long Term Goals - 03/29/18 1128      PT LONG TERM GOAL #1   Title  Ind with HEP.    Time  6    Period  Weeks    Status  New      PT LONG TERM GOAL #2   Title  Full right active knee extension in order to normalize gait.    Time  6    Period  Weeks    Status  New      PT LONG TERM GOAL #3   Title  Active knee flexion to 115 degrees+ so the patient can perform functional tasks and do so with pain not > 2-3/10.    Time  6    Period  Weeks    Status  New      PT LONG TERM GOAL #4   Title  Increase right knee strength to a solid 4+/5 to provide good stability for accomplishment of functional activities.    Time  6    Period  Weeks    Status  New      PT LONG TERM GOAL #5   Title  Decrease edema to within 2.5 cms of non-affected side to assist with pain reduction and range of motion gains.    Time  6    Period  Weeks    Status  New      PT LONG TERM GOAL #6   Title  Perform a reciprocating  stair gait with one railing with pain not > 2-3/10.    Time  6    Period  Weeks    Status  New            Plan - 04/11/18 0909    Clinical Impression Statement  Patient tolerated today's treatment  fairly well although still limited due to discomfort and pain. Post surgical aquacel donned to R knee was absent with normalized appearance and minimal scabbing present. Light exercises completed today with patient limited by discomfort. Patient demonstrated a resting position in elevation to promote knee extension with wedge and pillows under ankle. Normal modalities response noted following removal of the modalities.    Rehab Potential  Excellent    PT Frequency  3x / week    PT Duration  6 weeks    PT Treatment/Interventions  ADLs/Self Care Home Management;Cryotherapy;Electrical Stimulation;Gait training;Stair training;Functional mobility training;Therapeutic activities;Therapeutic exercise;Neuromuscular re-education;Patient/family education;Passive range of motion;Manual techniques;Vasopneumatic Device    PT Next Visit Plan  TKA protocol.  Vasopneumatic and e'stim stim.    Consulted and Agree with Plan of Care  Patient       Patient will benefit from skilled therapeutic intervention in order to improve the following deficits and impairments:  Decreased activity tolerance, Decreased range of motion, Difficulty walking, Increased edema, Pain, Decreased strength  Visit Diagnosis: Chronic pain of right knee  Stiffness of right knee, not elsewhere classified  Muscle weakness (generalized)  Localized edema     Problem List Patient Active Problem List   Diagnosis Date Noted  . Osteoarthritis of right knee 03/24/2018  . IBS (irritable bowel syndrome) 06/15/2017  . GERD (gastroesophageal reflux disease) 06/15/2017  . HLD (hyperlipidemia)   . Fatty liver   . NSVT (nonsustained ventricular tachycardia) ([place])   . Nephrolithiasis   . Essential hypertension 02/13/2010    Standley Brooking, PTA 04/11/2018, 9:44 AM  [facility] [address] [place], [place], [address] Phone: [phone]   Fax:  [phone]  Name: Dawn Moon MRN:  [id number] Date of Birth: 09-09-1956

## 2018-04-13 ENCOUNTER — Ambulatory Visit: Payer: 59 | Admitting: Physical Therapy

## 2018-04-13 ENCOUNTER — Encounter: Payer: Self-pay | Admitting: Physical Therapy

## 2018-04-13 DIAGNOSIS — M25561 Pain in right knee: Principal | ICD-10-CM

## 2018-04-13 DIAGNOSIS — M25661 Stiffness of right knee, not elsewhere classified: Secondary | ICD-10-CM

## 2018-04-13 DIAGNOSIS — R600 Localized edema: Secondary | ICD-10-CM

## 2018-04-13 DIAGNOSIS — M6281 Muscle weakness (generalized): Secondary | ICD-10-CM

## 2018-04-13 DIAGNOSIS — G8929 Other chronic pain: Secondary | ICD-10-CM

## 2018-04-13 NOTE — Therapy (Signed)
[facility] [place], [place], [address] Phone: [phone]   Fax:  [phone]  Physical Therapy Treatment  Patient Details  Name: Dawn Moon MRN: [id number] Date of Birth: 1956-04-30 Referring Provider: Rod Can MD   Encounter Date: 04/13/2018  PT End of Session - 04/13/18 1026    Visit Number  7    Number of Visits  18    Date for PT Re-Evaluation  05/10/18    Authorization Type  FOTO AT LEAST EVERY 5TH VISIT.    PT Start Time  0900    PT Stop Time  0958    PT Time Calculation (min)  58 min    Activity Tolerance  Patient tolerated treatment well    Behavior During Therapy  WFL for tasks assessed/performed       Past Medical History:  Diagnosis Date  . GERD (gastroesophageal reflux disease)   . History of exercise stress test    03-07-2010  Stress echo--- no arrhythmias or conduction abnormalilites and negative for ischemia or chest pain  . History of kidney stones   . History of paroxysmal supraventricular tachycardia    episode 2011  consult w/ dr Caryl Comes --  put on atenolol--  per pt no longer an issue  . HTN (hypertension)    cardiologist --  dr Stanford Breed  . IBS (irritable bowel syndrome)    diarrhea  . Nonalcoholic steatohepatitis (NASH)   . Numbness and tingling of left lower extremity    post achilles tendon repair  . OA (osteoarthritis)    knees  . PONV (postoperative nausea and vomiting)   . Vitamin D deficiency   . Wears glasses     Past Surgical History:  Procedure Laterality Date  . ACHILLES TENDON SURGERY Left 06/2016  . BREAST BIOPSY Left 02/2016   benign  . COLONOSCOPY  2005  . CT CTA CORONARY W/CA SCORE W/CM &/OR WO/CM  01/01/2014   non-obstructive calcified plaque in pLAD (0-25%), no significant incidental noncardiac findings noted  . ESOPHAGOGASTRODUODENOSCOPY  01/2014  . EXTRACORPOREAL SHOCK WAVE LITHOTRIPSY  2010  . KNEE ARTHROPLASTY Right 03/24/2018   Procedure: RIGHT  TOTAL KNEE ARTHROPLASTY WITH COMPUTER NAVIGATION;  Surgeon: Rod Can, MD;  Location: [facility];  Service: Orthopedics;  Laterality: Right;  Needs RNFA  . KNEE ARTHROSCOPY Right 12/04/2016   Procedure: ARTHROSCOPY KNEE WITH PARTIAL MEDIAL MENISCECTOMY;  Surgeon: Rod Can, MD;  Location: [facility];  Service: Orthopedics;  Laterality: Right;  . LEFT HEART CATHETERIZATION WITH CORONARY ANGIOGRAM N/A 01/11/2012   Procedure: LEFT HEART CATHETERIZATION WITH CORONARY ANGIOGRAM;  Surgeon: Sherren Mocha, MD;  Location: [facility] CATH LAB;  Service: Cardiovascular;  Laterality: N/A;  widely patent coronary arterires without significant obstructive CAD,  normal LVF, ef 55-56%  . TONSILLECTOMY AND ADENOIDECTOMY  child  . TRANSTHORACIC ECHOCARDIOGRAM  01/18/2012   mild LVH,  ef 60%/  trivial TR  . TUBAL LIGATION  1985    There were no vitals filed for this visit.  Subjective Assessment - 04/13/18 1021    Subjective  No new complaints today.    Currently in Pain?  Yes    Pain Score  2     Pain Orientation  Right    Pain Type  Surgical pain    Pain Onset  1 to 4 weeks ago                       [facility] Adult PT Treatment/Exercise -  04/13/18 0001      Exercises   Exercises  Knee/Hip      Knee/Hip Exercises: Stretches   Soleus Stretch Limitations  2 minutes right calf stretch on Rockerboard.    Other Knee/Hip Stretches  14 inch box lunges x 3 minutes.      Knee/Hip Exercises: Aerobic   Nustep  Level 3 x 15 minutes moving forward x 3 to increase knee flexion.      Knee/Hip Exercises: Machines for Strengthening   Cybex Knee Extension  10# with endrange flexion stretch and then with slow reps into both flexion and extension over 5 minutes.      Modalities   Modalities  Teacher, English as a foreign language Location  -- Right knee.    Electrical Stimulation Action  IFC    Electrical Stimulation Parameters  1-10 hz x 15  minutes.    Electrical Stimulation Goals  Edema;Pain      Vasopneumatic   Number Minutes Vasopneumatic   15 minutes    Vasopnuematic Location   -- Right knee.    Vasopneumatic Pressure  Low               PT Short Term Goals - 03/29/18 1128      PT SHORT TERM GOAL #1   Title  STG's=LTG's.        PT Long Term Goals - 03/29/18 1128      PT LONG TERM GOAL #1   Title  Ind with HEP.    Time  6    Period  Weeks    Status  New      PT LONG TERM GOAL #2   Title  Full right active knee extension in order to normalize gait.    Time  6    Period  Weeks    Status  New      PT LONG TERM GOAL #3   Title  Active knee flexion to 115 degrees+ so the patient can perform functional tasks and do so with pain not > 2-3/10.    Time  6    Period  Weeks    Status  New      PT LONG TERM GOAL #4   Title  Increase right knee strength to a solid 4+/5 to provide good stability for accomplishment of functional activities.    Time  6    Period  Weeks    Status  New      PT LONG TERM GOAL #5   Title  Decrease edema to within 2.5 cms of non-affected side to assist with pain reduction and range of motion gains.    Time  6    Period  Weeks    Status  New      PT LONG TERM GOAL #6   Title  Perform a reciprocating stair gait with one railing with pain not > 2-3/10.    Time  6    Period  Weeks    Status  New            Plan - 04/13/18 1025    Clinical Impression Statement  Patient did great today with passive right knee flexion to 103 degrees.    PT Treatment/Interventions  ADLs/Self Care Home Management;Cryotherapy;Electrical Stimulation;Gait training;Stair training;Functional mobility training;Therapeutic activities;Therapeutic exercise;Neuromuscular re-education;Patient/family education;Passive range of motion;Manual techniques;Vasopneumatic Device    PT Next Visit Plan  TKA protocol.  Vasopneumatic and e'stim stim.  Patient will benefit from skilled therapeutic  intervention in order to improve the following deficits and impairments:  Decreased activity tolerance, Decreased range of motion, Difficulty walking, Increased edema, Pain, Decreased strength  Visit Diagnosis: Chronic pain of right knee  Stiffness of right knee, not elsewhere classified  Muscle weakness (generalized)  Localized edema     Problem List Patient Active Problem List   Diagnosis Date Noted  . Osteoarthritis of right knee 03/24/2018  . IBS (irritable bowel syndrome) 06/15/2017  . GERD (gastroesophageal reflux disease) 06/15/2017  . HLD (hyperlipidemia)   . Fatty liver   . NSVT (nonsustained ventricular tachycardia) ([place])   . Nephrolithiasis   . Essential hypertension 02/13/2010    Georgiann Neider, [place] MPT 04/13/2018, 10:27 AM  [facility] [address] [place], [place], [address] Phone: [phone]   Fax:  [phone]  Name: JAYONNA MEYERING MRN: [id number] Date of Birth: 08-24-1956

## 2018-04-15 ENCOUNTER — Ambulatory Visit: Payer: 59 | Admitting: Physical Therapy

## 2018-04-15 ENCOUNTER — Encounter: Payer: Self-pay | Admitting: Physical Therapy

## 2018-04-15 DIAGNOSIS — G8929 Other chronic pain: Secondary | ICD-10-CM

## 2018-04-15 DIAGNOSIS — M25561 Pain in right knee: Principal | ICD-10-CM

## 2018-04-15 DIAGNOSIS — M6281 Muscle weakness (generalized): Secondary | ICD-10-CM

## 2018-04-15 DIAGNOSIS — R600 Localized edema: Secondary | ICD-10-CM

## 2018-04-15 DIAGNOSIS — M25661 Stiffness of right knee, not elsewhere classified: Secondary | ICD-10-CM

## 2018-04-15 NOTE — Therapy (Signed)
[facility] [place], [place], [address] Phone: [phone]   Fax:  [phone]  Physical Therapy Treatment  Patient Details  Name: Dawn Moon MRN: [id number] Date of Birth: Mar 10, 1956 Referring Provider: Rod Can MD   Encounter Date: 04/15/2018  PT End of Session - 04/15/18 1140    Visit Number  8    Number of Visits  18    Date for PT Re-Evaluation  05/10/18    Authorization Type  FOTO AT LEAST EVERY 5TH VISIT.    PT Start Time  0945    PT Stop Time  1044    PT Time Calculation (min)  59 min    Activity Tolerance  Patient tolerated treatment well    Behavior During Therapy  WFL for tasks assessed/performed       Past Medical History:  Diagnosis Date  . GERD (gastroesophageal reflux disease)   . History of exercise stress test    03-07-2010  Stress echo--- no arrhythmias or conduction abnormalilites and negative for ischemia or chest pain  . History of kidney stones   . History of paroxysmal supraventricular tachycardia    episode 2011  consult w/ dr Caryl Comes --  put on atenolol--  per pt no longer an issue  . HTN (hypertension)    cardiologist --  dr Stanford Breed  . IBS (irritable bowel syndrome)    diarrhea  . Nonalcoholic steatohepatitis (NASH)   . Numbness and tingling of left lower extremity    post achilles tendon repair  . OA (osteoarthritis)    knees  . PONV (postoperative nausea and vomiting)   . Vitamin D deficiency   . Wears glasses     Past Surgical History:  Procedure Laterality Date  . ACHILLES TENDON SURGERY Left 06/2016  . BREAST BIOPSY Left 02/2016   benign  . COLONOSCOPY  2005  . CT CTA CORONARY W/CA SCORE W/CM &/OR WO/CM  01/01/2014   non-obstructive calcified plaque in pLAD (0-25%), no significant incidental noncardiac findings noted  . ESOPHAGOGASTRODUODENOSCOPY  01/2014  . EXTRACORPOREAL SHOCK WAVE LITHOTRIPSY  2010  . KNEE ARTHROPLASTY Right 03/24/2018   Procedure: RIGHT  TOTAL KNEE ARTHROPLASTY WITH COMPUTER NAVIGATION;  Surgeon: Rod Can, MD;  Location: [facility];  Service: Orthopedics;  Laterality: Right;  Needs RNFA  . KNEE ARTHROSCOPY Right 12/04/2016   Procedure: ARTHROSCOPY KNEE WITH PARTIAL MEDIAL MENISCECTOMY;  Surgeon: Rod Can, MD;  Location: [facility];  Service: Orthopedics;  Laterality: Right;  . LEFT HEART CATHETERIZATION WITH CORONARY ANGIOGRAM N/A 01/11/2012   Procedure: LEFT HEART CATHETERIZATION WITH CORONARY ANGIOGRAM;  Surgeon: Sherren Mocha, MD;  Location: [facility] CATH LAB;  Service: Cardiovascular;  Laterality: N/A;  widely patent coronary arterires without significant obstructive CAD,  normal LVF, ef 55-56%  . TONSILLECTOMY AND ADENOIDECTOMY  child  . TRANSTHORACIC ECHOCARDIOGRAM  01/18/2012   mild LVH,  ef 60%/  trivial TR  . TUBAL LIGATION  1985    There were no vitals filed for this visit.  Subjective Assessment - 04/15/18 1140    Subjective  I'm using a cane now.    Pain Score  2     Pain Location  Knee    Pain Orientation  Right    Pain Descriptors / Indicators  Discomfort    Pain Type  Surgical pain    Pain Onset  1 to 4 weeks ago  [facility] Adult PT Treatment/Exercise - 04/15/18 0001      Exercises   Exercises  Knee/Hip      Knee/Hip Exercises: Aerobic   Nustep  Level 3 x 17 minutes moving forward x 4 to increase flexion.      Knee/Hip Exercises: Machines for Strengthening   Cybex Knee Extension  10# x 5 minutes.      Knee/Hip Exercises: Standing   Other Standing Knee Exercises  14 inch box lunges x 3 minutes and calf stretch on Rockerborad x 2 minutes.      Knee/Hip Exercises: Supine   Other Supine Knee/Hip Exercises  5# overpressure stretch x 2 minutes.      Acupuncturist Location  -- Right knee.    Electrical Stimulation Action  IFC x 20 minutes.    Electrical Stimulation Parameters  1-10 Hz.    Electrical Stimulation  Goals  Edema;Pain      Vasopneumatic   Number Minutes Vasopneumatic   20 minutes    Vasopnuematic Location   -- Right knee.    Vasopneumatic Pressure  Medium               PT Short Term Goals - 03/29/18 1128      PT SHORT TERM GOAL #1   Title  STG's=LTG's.        PT Long Term Goals - 03/29/18 1128      PT LONG TERM GOAL #1   Title  Ind with HEP.    Time  6    Period  Weeks    Status  New      PT LONG TERM GOAL #2   Title  Full right active knee extension in order to normalize gait.    Time  6    Period  Weeks    Status  New      PT LONG TERM GOAL #3   Title  Active knee flexion to 115 degrees+ so the patient can perform functional tasks and do so with pain not > 2-3/10.    Time  6    Period  Weeks    Status  New      PT LONG TERM GOAL #4   Title  Increase right knee strength to a solid 4+/5 to provide good stability for accomplishment of functional activities.    Time  6    Period  Weeks    Status  New      PT LONG TERM GOAL #5   Title  Decrease edema to within 2.5 cms of non-affected side to assist with pain reduction and range of motion gains.    Time  6    Period  Weeks    Status  New      PT LONG TERM GOAL #6   Title  Perform a reciprocating stair gait with one railing with pain not > 2-3/10.    Time  6    Period  Weeks    Status  New            Plan - 04/15/18 1146    Clinical Impression Statement  The patient has transitioned to a cane now.  Patient to now start supine overpressure stretch at home.  She tolerated 5# x 2 minutes today.    PT Treatment/Interventions  ADLs/Self Care Home Management;Cryotherapy;Electrical Stimulation;Gait training;Stair training;Functional mobility training;Therapeutic activities;Therapeutic exercise;Neuromuscular re-education;Patient/family education;Passive range of motion;Manual techniques;Vasopneumatic Device    PT Next Visit Plan  TKA protocol.  Vasopneumatic and  e'stim stim.    Consulted and Agree with  Plan of Care  Patient       Patient will benefit from skilled therapeutic intervention in order to improve the following deficits and impairments:  Decreased activity tolerance, Decreased range of motion, Difficulty walking, Increased edema, Pain, Decreased strength  Visit Diagnosis: Chronic pain of right knee  Stiffness of right knee, not elsewhere classified  Muscle weakness (generalized)  Localized edema     Problem List Patient Active Problem List   Diagnosis Date Noted  . Osteoarthritis of right knee 03/24/2018  . IBS (irritable bowel syndrome) 06/15/2017  . GERD (gastroesophageal reflux disease) 06/15/2017  . HLD (hyperlipidemia)   . Fatty liver   . NSVT (nonsustained ventricular tachycardia) ([place])   . Nephrolithiasis   . Essential hypertension 02/13/2010    APPLE[place]ATE, [place] MPT 04/15/2018, 11:49 AM  [facility] [address] [place], [place], [address] Phone: [phone]   Fax:  [phone]  Name: DARLENY SEM MRN: [id number] Date of Birth: 06-28-1956

## 2018-04-18 ENCOUNTER — Ambulatory Visit: Payer: 59 | Admitting: Physical Therapy

## 2018-04-18 ENCOUNTER — Encounter: Payer: Self-pay | Admitting: Physical Therapy

## 2018-04-18 DIAGNOSIS — M6281 Muscle weakness (generalized): Secondary | ICD-10-CM

## 2018-04-18 DIAGNOSIS — M25661 Stiffness of right knee, not elsewhere classified: Secondary | ICD-10-CM

## 2018-04-18 DIAGNOSIS — M25561 Pain in right knee: Principal | ICD-10-CM

## 2018-04-18 DIAGNOSIS — G8929 Other chronic pain: Secondary | ICD-10-CM

## 2018-04-18 DIAGNOSIS — R600 Localized edema: Secondary | ICD-10-CM

## 2018-04-18 NOTE — Therapy (Signed)
[facility] [place], [place], [address] Phone: [phone]   Fax:  [phone]  Physical Therapy Treatment  Patient Details  Name: Dawn Moon MRN: [id number] Date of Birth: 1956/06/20 Referring Provider: Rod Can MD   Encounter Date: 04/18/2018  PT End of Session - 04/18/18 1221    Visit Number  9    Number of Visits  18    Date for PT Re-Evaluation  05/10/18    Authorization Type  FOTO AT LEAST EVERY 5TH VISIT.    PT Start Time  0900    PT Stop Time  1001    PT Time Calculation (min)  61 min       Past Medical History:  Diagnosis Date  . GERD (gastroesophageal reflux disease)   . History of exercise stress test    03-07-2010  Stress echo--- no arrhythmias or conduction abnormalilites and negative for ischemia or chest pain  . History of kidney stones   . History of paroxysmal supraventricular tachycardia    episode 2011  consult w/ dr Caryl Comes --  put on atenolol--  per pt no longer an issue  . HTN (hypertension)    cardiologist --  dr Stanford Breed  . IBS (irritable bowel syndrome)    diarrhea  . Nonalcoholic steatohepatitis (NASH)   . Numbness and tingling of left lower extremity    post achilles tendon repair  . OA (osteoarthritis)    knees  . PONV (postoperative nausea and vomiting)   . Vitamin D deficiency   . Wears glasses     Past Surgical History:  Procedure Laterality Date  . ACHILLES TENDON SURGERY Left 06/2016  . BREAST BIOPSY Left 02/2016   benign  . COLONOSCOPY  2005  . CT CTA CORONARY W/CA SCORE W/CM &/OR WO/CM  01/01/2014   non-obstructive calcified plaque in pLAD (0-25%), no significant incidental noncardiac findings noted  . ESOPHAGOGASTRODUODENOSCOPY  01/2014  . EXTRACORPOREAL SHOCK WAVE LITHOTRIPSY  2010  . KNEE ARTHROPLASTY Right 03/24/2018   Procedure: RIGHT TOTAL KNEE ARTHROPLASTY WITH COMPUTER NAVIGATION;  Surgeon: Rod Can, MD;  Location: [facility];  Service: Orthopedics;   Laterality: Right;  Needs RNFA  . KNEE ARTHROSCOPY Right 12/04/2016   Procedure: ARTHROSCOPY KNEE WITH PARTIAL MEDIAL MENISCECTOMY;  Surgeon: Rod Can, MD;  Location: [facility];  Service: Orthopedics;  Laterality: Right;  . LEFT HEART CATHETERIZATION WITH CORONARY ANGIOGRAM N/A 01/11/2012   Procedure: LEFT HEART CATHETERIZATION WITH CORONARY ANGIOGRAM;  Surgeon: Sherren Mocha, MD;  Location: [facility] CATH LAB;  Service: Cardiovascular;  Laterality: N/A;  widely patent coronary arterires without significant obstructive CAD,  normal LVF, ef 55-56%  . TONSILLECTOMY AND ADENOIDECTOMY  child  . TRANSTHORACIC ECHOCARDIOGRAM  01/18/2012   mild LVH,  ef 60%/  trivial TR  . TUBAL LIGATION  1985    There were no vitals filed for this visit.  Subjective Assessment - 04/18/18 1218    Subjective  I'm doing good.    Pain Score  2     Pain Location  Knee    Pain Orientation  Right    Pain Descriptors / Indicators  Discomfort    Pain Onset  1 to 4 weeks ago                       [facility] Adult PT Treatment/Exercise - 04/18/18 0001      Exercises   Exercises  Knee/Hip      Knee/Hip Exercises: Aerobic  Nustep  Level 3 x 15 minutes moving forward x 3 to increase flexion.      Knee/Hip Exercises: Machines for Strengthening   Cybex Knee Extension  10# x 4 minutes.    Cybex Knee Flexion  30# x 4 minutes.      Knee/Hip Exercises: Supine   Other Supine Knee/Hip Exercises  2# overpressure stretch to right knee x 3 minutes.      Acupuncturist Location  -- Right knee.    Electrical Stimulation Action  IFC x 20 minutes.    Electrical Stimulation Goals  Edema;Pain      Vasopneumatic   Number Minutes Vasopneumatic   20 minutes    Vasopnuematic Location   -- Right knee.    Vasopneumatic Pressure  Medium      Manual Therapy   Manual Therapy  Passive ROM    Passive ROM  PROM to patient's right knee into flexion and extension x 8  minutes.               PT Short Term Goals - 03/29/18 1128      PT SHORT TERM GOAL #1   Title  STG's=LTG's.        PT Long Term Goals - 03/29/18 1128      PT LONG TERM GOAL #1   Title  Ind with HEP.    Time  6    Period  Weeks    Status  New      PT LONG TERM GOAL #2   Title  Full right active knee extension in order to normalize gait.    Time  6    Period  Weeks    Status  New      PT LONG TERM GOAL #3   Title  Active knee flexion to 115 degrees+ so the patient can perform functional tasks and do so with pain not > 2-3/10.    Time  6    Period  Weeks    Status  New      PT LONG TERM GOAL #4   Title  Increase right knee strength to a solid 4+/5 to provide good stability for accomplishment of functional activities.    Time  6    Period  Weeks    Status  New      PT LONG TERM GOAL #5   Title  Decrease edema to within 2.5 cms of non-affected side to assist with pain reduction and range of motion gains.    Time  6    Period  Weeks    Status  New      PT LONG TERM GOAL #6   Title  Perform a reciprocating stair gait with one railing with pain not > 2-3/10.    Time  6    Period  Weeks    Status  New            Plan - 04/18/18 1222    Clinical Impression Statement  Patient did very well today.  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  She did state, however, that her need was bent for many years prior to surgery.    PT Frequency  3x / week    PT Duration  6 weeks    PT Treatment/Interventions  ADLs/Self Care Home Management;Cryotherapy;Electrical Stimulation;Gait training;Stair training;Functional mobility training;Therapeutic activities;Therapeutic exercise;Neuromuscular re-education;Patient/family education;Passive range of motion;Manual techniques;Vasopneumatic Device    PT Next Visit Plan  TKA protocol.  Vasopneumatic  and e'stim stim.    Consulted and Agree with Plan of Care  Patient       Patient will benefit from skilled therapeutic  intervention in order to improve the following deficits and impairments:  Decreased activity tolerance, Decreased range of motion, Difficulty walking, Increased edema, Pain, Decreased strength  Visit Diagnosis: Chronic pain of right knee  Stiffness of right knee, not elsewhere classified  Muscle weakness (generalized)  Localized edema     Problem List Patient Active Problem List   Diagnosis Date Noted  . Osteoarthritis of right knee 03/24/2018  . IBS (irritable bowel syndrome) 06/15/2017  . GERD (gastroesophageal reflux disease) 06/15/2017  . HLD (hyperlipidemia)   . Fatty liver   . NSVT (nonsustained ventricular tachycardia) ([place])   . Nephrolithiasis   . Essential hypertension 02/13/2010    Lamyah Creed, [place] MPT 04/18/2018, 12:25 PM  [facility] [address] [place], [place], [address] Phone: [phone]   Fax:  [phone]  Name: Dawn Moon MRN: [id number] Date of Birth: 21-Feb-1956

## 2018-04-19 ENCOUNTER — Ambulatory Visit: Payer: 59 | Admitting: Physical Therapy

## 2018-04-19 DIAGNOSIS — M25561 Pain in right knee: Secondary | ICD-10-CM | POA: Diagnosis not present

## 2018-04-19 DIAGNOSIS — R600 Localized edema: Secondary | ICD-10-CM

## 2018-04-19 DIAGNOSIS — G8929 Other chronic pain: Secondary | ICD-10-CM

## 2018-04-19 DIAGNOSIS — M6281 Muscle weakness (generalized): Secondary | ICD-10-CM

## 2018-04-19 DIAGNOSIS — M25661 Stiffness of right knee, not elsewhere classified: Secondary | ICD-10-CM

## 2018-04-19 NOTE — Therapy (Signed)
[facility] [place], [place], [address] Phone: [phone]   Fax:  [phone]  Physical Therapy Treatment  Patient Details  Name: Dawn Moon MRN: [id number] Date of Birth: 10-20-1956 Referring Provider: Rod Can MD   Encounter Date: 04/19/2018  PT End of Session - 04/19/18 0858    Visit Number  10    Number of Visits  18    Date for PT Re-Evaluation  05/10/18    Authorization Type  FOTO AT LEAST EVERY 5TH VISIT.    PT Start Time  0900    PT Stop Time  0955    PT Time Calculation (min)  55 min    Activity Tolerance  Patient tolerated treatment well    Behavior During Therapy  WFL for tasks assessed/performed       Past Medical History:  Diagnosis Date  . GERD (gastroesophageal reflux disease)   . History of exercise stress test    03-07-2010  Stress echo--- no arrhythmias or conduction abnormalilites and negative for ischemia or chest pain  . History of kidney stones   . History of paroxysmal supraventricular tachycardia    episode 2011  consult w/ dr Caryl Comes --  put on atenolol--  per pt no longer an issue  . HTN (hypertension)    cardiologist --  dr Stanford Breed  . IBS (irritable bowel syndrome)    diarrhea  . Nonalcoholic steatohepatitis (NASH)   . Numbness and tingling of left lower extremity    post achilles tendon repair  . OA (osteoarthritis)    knees  . PONV (postoperative nausea and vomiting)   . Vitamin D deficiency   . Wears glasses     Past Surgical History:  Procedure Laterality Date  . ACHILLES TENDON SURGERY Left 06/2016  . BREAST BIOPSY Left 02/2016   benign  . COLONOSCOPY  2005  . CT CTA CORONARY W/CA SCORE W/CM &/OR WO/CM  01/01/2014   non-obstructive calcified plaque in pLAD (0-25%), no significant incidental noncardiac findings noted  . ESOPHAGOGASTRODUODENOSCOPY  01/2014  . EXTRACORPOREAL SHOCK WAVE LITHOTRIPSY  2010  . KNEE ARTHROPLASTY Right 03/24/2018   Procedure: RIGHT  TOTAL KNEE ARTHROPLASTY WITH COMPUTER NAVIGATION;  Surgeon: Rod Can, MD;  Location: [facility];  Service: Orthopedics;  Laterality: Right;  Needs RNFA  . KNEE ARTHROSCOPY Right 12/04/2016   Procedure: ARTHROSCOPY KNEE WITH PARTIAL MEDIAL MENISCECTOMY;  Surgeon: Rod Can, MD;  Location: [facility];  Service: Orthopedics;  Laterality: Right;  . LEFT HEART CATHETERIZATION WITH CORONARY ANGIOGRAM N/A 01/11/2012   Procedure: LEFT HEART CATHETERIZATION WITH CORONARY ANGIOGRAM;  Surgeon: Sherren Mocha, MD;  Location: [facility] CATH LAB;  Service: Cardiovascular;  Laterality: N/A;  widely patent coronary arterires without significant obstructive CAD,  normal LVF, ef 55-56%  . TONSILLECTOMY AND ADENOIDECTOMY  child  . TRANSTHORACIC ECHOCARDIOGRAM  01/18/2012   mild LVH,  ef 60%/  trivial TR  . TUBAL LIGATION  1985   Progress Note Reporting Period 03/29/18 to 04/19/18  See note below for Objective Data and Assessment of Progress/Goals.      There were no vitals filed for this visit.  Subjective Assessment - 04/19/18 0902    Subjective  Patient states she is still having trouble sleeping. She has called Dr. Lyla Glassing and they advised Benadryl, but that's not helping. Pain is in shin and calf.    Patient Stated Goals  Get out of pain.    Currently in Pain?  Yes    Pain Score  3     Pain Location  Knee    Pain Orientation  Right    Pain Descriptors / Indicators  Discomfort    Pain Type  Surgical pain    Pain Onset  1 to 4 weeks ago    Pain Frequency  Constant    Aggravating Factors   sleeping, standing    Pain Relieving Factors  rest ice         [facility] PT Assessment - 04/19/18 0001      Observation/Other Assessments   Focus on Therapeutic Outcomes (FOTO)   67% limited      Observation/Other Assessments-Edema    Edema  Circumferential      Circumferential Edema   Circumferential - Right  47 cm      ROM / Strength   AROM / PROM / Strength  AROM;PROM;Strength       AROM   AROM Assessment Site  Knee    Right/Left Knee  Right    Right Knee Extension  -16    Right Knee Flexion  97      PROM   PROM Assessment Site  Knee    Right/Left Knee  Right    Right Knee Extension  -13    Right Knee Flexion  106                   [facility] Adult PT Treatment/Exercise - 04/19/18 0001      Exercises   Exercises  Knee/Hip      Knee/Hip Exercises: Aerobic   Nustep  Level 3 x 15 minutes moving forward x 3 to increase flexion.      Modalities   Modalities  Psychologist, educational Location  R knee    Electrical Stimulation Action  IFC x 15 min 1-10 Hz     Electrical Stimulation Goals  Edema;Pain      Vasopneumatic   Number Minutes Vasopneumatic   15 minutes    Vasopnuematic Location   Knee    Vasopneumatic Pressure  Medium    Vasopneumatic Temperature   34      Manual Therapy   Manual Therapy  Joint mobilization;Passive ROM;Soft tissue mobilization;Taping    Joint Mobilization  for R knee relaxation    Soft tissue mobilization  to R quads and HS in SDLY    Passive ROM  to R knee in supine into flex and extension with active quad sets during extension stretch    Kinesiotex  Edema               PT Short Term Goals - 03/29/18 1128      PT SHORT TERM GOAL #1   Title  STG's=LTG's.        PT Long Term Goals - 04/19/18 0909      PT LONG TERM GOAL #1   Title  Ind with HEP.    Time  6    Period  Weeks    Status  On-going      PT LONG TERM GOAL #2   Title  Full right active knee extension in order to normalize gait.    Time  6    Period  Weeks    Status  On-going      PT LONG TERM GOAL #3   Title  Active knee flexion to 115 degrees+ so the patient can perform functional tasks and do so with pain not > 2-3/10.  Time  6    Status  On-going      PT LONG TERM GOAL #4   Title  Increase right knee strength to a solid 4+/5 to provide good stability for  accomplishment of functional activities.    Time  6    Period  Weeks    Status  On-going      PT LONG TERM GOAL #5   Title  Decrease edema to within 2.5 cms of non-affected side to assist with pain reduction and range of motion gains.    Time  6    Period  Weeks    Status  On-going      PT LONG TERM GOAL #6   Title  Perform a reciprocating stair gait with one railing with pain not > 2-3/10.    Baseline  step to pattern     Time  6    Period  Weeks    Status  On-going            Plan - 04/19/18 1254    Clinical Impression Statement  Patient continues to c/o pain in knee. She tolerated STW well and it was encouraged for home using dowel. Additionally kinesiotape was applied at end of session for edema.     Rehab Potential  Excellent    PT Frequency  3x / week    PT Duration  6 weeks    PT Treatment/Interventions  ADLs/Self Care Home Management;Cryotherapy;Electrical Stimulation;Gait training;Stair training;Functional mobility training;Therapeutic activities;Therapeutic exercise;Neuromuscular re-education;Patient/family education;Passive range of motion;Manual techniques;Vasopneumatic Device    PT Next Visit Plan  Assess taping, continue with STW to quads and HS. TKA protocol.  Vasopneumatic and e'stim stim.       Patient will benefit from skilled therapeutic intervention in order to improve the following deficits and impairments:  Decreased activity tolerance, Decreased range of motion, Difficulty walking, Increased edema, Pain, Decreased strength  Visit Diagnosis: Chronic pain of right knee  Stiffness of right knee, not elsewhere classified  Muscle weakness (generalized)  Localized edema     Problem List Patient Active Problem List   Diagnosis Date Noted  . Osteoarthritis of right knee 03/24/2018  . IBS (irritable bowel syndrome) 06/15/2017  . GERD (gastroesophageal reflux disease) 06/15/2017  . HLD (hyperlipidemia)   . Fatty liver   . NSVT (nonsustained  ventricular tachycardia) ([place])   . Nephrolithiasis   . Essential hypertension 02/13/2010    Madelyn Flavors PT 04/19/2018, 1:00 PM  [facility] [place], [place], [address] Phone: [phone]   Fax:  [phone]  Name: Dawn Moon MRN: [id number] Date of Birth: 03/04/1956

## 2018-04-20 ENCOUNTER — Encounter: Payer: 59 | Admitting: Physical Therapy

## 2018-04-22 ENCOUNTER — Encounter: Payer: Self-pay | Admitting: Physical Therapy

## 2018-04-22 ENCOUNTER — Other Ambulatory Visit: Payer: Self-pay | Admitting: *Deleted

## 2018-04-22 ENCOUNTER — Ambulatory Visit: Payer: 59 | Attending: Orthopedic Surgery | Admitting: Physical Therapy

## 2018-04-22 ENCOUNTER — Other Ambulatory Visit: Payer: Self-pay | Admitting: Family

## 2018-04-22 DIAGNOSIS — R600 Localized edema: Secondary | ICD-10-CM | POA: Diagnosis present

## 2018-04-22 DIAGNOSIS — M6281 Muscle weakness (generalized): Secondary | ICD-10-CM | POA: Diagnosis present

## 2018-04-22 DIAGNOSIS — M25661 Stiffness of right knee, not elsewhere classified: Secondary | ICD-10-CM | POA: Diagnosis present

## 2018-04-22 DIAGNOSIS — G8929 Other chronic pain: Secondary | ICD-10-CM

## 2018-04-22 DIAGNOSIS — I10 Essential (primary) hypertension: Secondary | ICD-10-CM

## 2018-04-22 DIAGNOSIS — M25561 Pain in right knee: Secondary | ICD-10-CM | POA: Insufficient documentation

## 2018-04-22 MED ORDER — POTASSIUM CITRATE ER 10 MEQ (1080 MG) PO TBCR: 10.0000 meq | EXTENDED_RELEASE_TABLET | ORAL | 1 refills | Status: DC

## 2018-04-22 MED ORDER — FUROSEMIDE 20 MG PO TABS: 20.0000 mg | ORAL_TABLET | Freq: Every morning | ORAL | 1 refills | Status: DC

## 2018-04-22 NOTE — Therapy (Addendum)
[facility] [place], [place], [address] Phone: [phone]   Fax:  [phone]  Physical Therapy Treatment  Patient Details  Name: Dawn Moon MRN: [id number] Date of Birth: November 03, 1956 Referring Provider: Rod Can MD   Encounter Date: 04/22/2018  PT End of Session - 04/22/18 0918    Visit Number  11    Number of Visits  18    Date for PT Re-Evaluation  05/10/18    Authorization Type  FOTO AT LEAST EVERY 5TH VISIT.    PT Start Time  0904    PT Stop Time  1004    PT Time Calculation (min)  60 min    Activity Tolerance  Patient tolerated treatment well    Behavior During Therapy  WFL for tasks assessed/performed       Past Medical History:  Diagnosis Date  . GERD (gastroesophageal reflux disease)   . History of exercise stress test    03-07-2010  Stress echo--- no arrhythmias or conduction abnormalilites and negative for ischemia or chest pain  . History of kidney stones   . History of paroxysmal supraventricular tachycardia    episode 2011  consult w/ dr Caryl Comes --  put on atenolol--  per pt no longer an issue  . HTN (hypertension)    cardiologist --  dr Stanford Breed  . IBS (irritable bowel syndrome)    diarrhea  . Nonalcoholic steatohepatitis (NASH)   . Numbness and tingling of left lower extremity    post achilles tendon repair  . OA (osteoarthritis)    knees  . PONV (postoperative nausea and vomiting)   . Vitamin D deficiency   . Wears glasses     Past Surgical History:  Procedure Laterality Date  . ACHILLES TENDON SURGERY Left 06/2016  . BREAST BIOPSY Left 02/2016   benign  . COLONOSCOPY  2005  . CT CTA CORONARY W/CA SCORE W/CM &/OR WO/CM  01/01/2014   non-obstructive calcified plaque in pLAD (0-25%), no significant incidental noncardiac findings noted  . ESOPHAGOGASTRODUODENOSCOPY  01/2014  . EXTRACORPOREAL SHOCK WAVE LITHOTRIPSY  2010  . KNEE ARTHROPLASTY Right 03/24/2018   Procedure: RIGHT  TOTAL KNEE ARTHROPLASTY WITH COMPUTER NAVIGATION;  Surgeon: Rod Can, MD;  Location: [facility];  Service: Orthopedics;  Laterality: Right;  Needs RNFA  . KNEE ARTHROSCOPY Right 12/04/2016   Procedure: ARTHROSCOPY KNEE WITH PARTIAL MEDIAL MENISCECTOMY;  Surgeon: Rod Can, MD;  Location: [facility];  Service: Orthopedics;  Laterality: Right;  . LEFT HEART CATHETERIZATION WITH CORONARY ANGIOGRAM N/A 01/11/2012   Procedure: LEFT HEART CATHETERIZATION WITH CORONARY ANGIOGRAM;  Surgeon: Sherren Mocha, MD;  Location: [facility] CATH LAB;  Service: Cardiovascular;  Laterality: N/A;  widely patent coronary arterires without significant obstructive CAD,  normal LVF, ef 55-56%  . TONSILLECTOMY AND ADENOIDECTOMY  child  . TRANSTHORACIC ECHOCARDIOGRAM  01/18/2012   mild LVH,  ef 60%/  trivial TR  . TUBAL LIGATION  1985    There were no vitals filed for this visit.  Subjective Assessment - 04/22/18 0913    Subjective  Reports that her R calf has been very sore. States that her tape began coming off last night and went ahead and took it off. Reports she is a little concerned about the R calf tightness and tenderness.    Pertinent History  Left Achilles repair.    How long can you walk comfortably?  Short distances around house.    Currently in Pain?  Yes    Pain  Score  3     Pain Location  Knee    Pain Orientation  Right    Pain Descriptors / Indicators  Sore;Discomfort    Pain Type  Surgical pain    Pain Onset  1 to 4 weeks ago    Pain Frequency  Constant         [facility] PT Assessment - 04/22/18 0001      Assessment   Medical Diagnosis  Right total knee replacement.    Onset Date/Surgical Date  03/24/18    Next MD Visit  05/04/2018      Restrictions   Weight Bearing Restrictions  No      ROM / Strength   AROM / PROM / Strength  AROM      AROM   Overall AROM   Deficits    AROM Assessment Site  Knee    Right/Left Knee  Right    Right Knee Flexion  105                    [facility] Adult PT Treatment/Exercise - 04/22/18 0001      Knee/Hip Exercises: Stretches   Gastroc Stretch  Right;4 reps;20 seconds      Knee/Hip Exercises: Aerobic   Nustep  L5, seat 9 x15 min      Knee/Hip Exercises: Standing   Forward Lunges  Right;20 reps off 8" box      Modalities   Modalities  Psychologist, educational Location  R knee     Electrical Stimulation Action  IFC    Electrical Stimulation Parameters  80-150 hz x10 min    Electrical Stimulation Goals  Edema;Pain      Vasopneumatic   Number Minutes Vasopneumatic   10 minutes    Vasopnuematic Location   Knee    Vasopneumatic Pressure  Medium    Vasopneumatic Temperature   34      Manual Therapy   Manual Therapy  Passive ROM;Taping;Soft tissue mobilization    Soft tissue mobilization  R incision mobilizations to promote proper scar mobility    Passive ROM  PROM of R knee into flexion, extension to improve ROM    Kinesiotex  Edema      Kinesiotix   Edema  Attach along lateral and medial knee down into calf to reduce calf edema and tightness               PT Short Term Goals - 03/29/18 1128      PT SHORT TERM GOAL #1   Title  STG's=LTG's.        PT Long Term Goals - 04/19/18 0909      PT LONG TERM GOAL #1   Title  Ind with HEP.    Time  6    Period  Weeks    Status  On-going      PT LONG TERM GOAL #2   Title  Full right active knee extension in order to normalize gait.    Time  6    Period  Weeks    Status  On-going      PT LONG TERM GOAL #3   Title  Active knee flexion to 115 degrees+ so the patient can perform functional tasks and do so with pain not > 2-3/10.    Time  6    Status  On-going      PT LONG TERM GOAL #4   Title  Increase right knee strength to a solid 4+/5 to provide good stability for accomplishment of functional activities.    Time  6    Period  Weeks    Status  On-going       PT LONG TERM GOAL #5   Title  Decrease edema to within 2.5 cms of non-affected side to assist with pain reduction and range of motion gains.    Time  6    Period  Weeks    Status  On-going      PT LONG TERM GOAL #6   Title  Perform a reciprocating stair gait with one railing with pain not > 2-3/10.    Baseline  step to pattern     Time  6    Period  Weeks    Status  On-going            Plan - 04/22/18 1039    Clinical Impression Statement  Patient presented in clinic with continued reports of R calf tightness and [place] Applegate, MPT assessed R calf due to DVT worries. No abnormal redness or heat palpated in the R calf although edema and tenderness present. Bruising also noted in R calf as well. Patient guided through low level ROM exercises and stretching to reduce tightness and improve ROM. Normal modalities response noted following removal of the modalities. Patient required bathroom break during the treatment. Taping for edema completed to R calf more today.     Rehab Potential  Excellent    PT Frequency  3x / week    PT Duration  6 weeks    PT Treatment/Interventions  ADLs/Self Care Home Management;Cryotherapy;Electrical Stimulation;Gait training;Stair training;Functional mobility training;Therapeutic activities;Therapeutic exercise;Neuromuscular re-education;Patient/family education;Passive range of motion;Manual techniques;Vasopneumatic Device    PT Next Visit Plan  Assess taping, continue with STW to quads and HS. TKA protocol.  Vasopneumatic and e'stim stim.    Consulted and Agree with Plan of Care  Patient       Patient will benefit from skilled therapeutic intervention in order to improve the following deficits and impairments:  Decreased activity tolerance, Decreased range of motion, Difficulty walking, Increased edema, Pain, Decreased strength  Visit Diagnosis: Chronic pain of right knee  Stiffness of right knee, not elsewhere classified  Muscle weakness  (generalized)  Localized edema     Problem List Patient Active Problem List   Diagnosis Date Noted  . Osteoarthritis of right knee 03/24/2018  . IBS (irritable bowel syndrome) 06/15/2017  . GERD (gastroesophageal reflux disease) 06/15/2017  . HLD (hyperlipidemia)   . Fatty liver   . NSVT (nonsustained ventricular tachycardia) ([place])   . Nephrolithiasis   . Essential hypertension 02/13/2010    Standley Brooking, PTA 04/22/2018, 10:43 AM  [facility] [address] [place], [place], [address] Phone: [phone]   Fax:  [phone]  Name: Dawn Moon MRN: [id number] Date of Birth: [birth year]  PHYSICAL THERAPY DISCHARGE SUMMARY  Visits from Start of Care: 11.  Current functional level related to goals / functional outcomes: See above.   Remaining deficits: See above.   Education / Equipment: HEP. Plan: Patient agrees to discharge.  Patient goals were not met. Patient is being discharged due to not returning since the last visit.  ?????         Mali Applegate MPT

## 2018-04-23 ENCOUNTER — Other Ambulatory Visit: Payer: Self-pay

## 2018-04-23 ENCOUNTER — Emergency Department (HOSPITAL_BASED_OUTPATIENT_CLINIC_OR_DEPARTMENT_OTHER): Payer: 59

## 2018-04-23 ENCOUNTER — Inpatient Hospital Stay (HOSPITAL_BASED_OUTPATIENT_CLINIC_OR_DEPARTMENT_OTHER)
Admission: EM | Admit: 2018-04-23 | Discharge: 2018-05-03 | DRG: 189 | Disposition: A | Discharge status: Home / Self Care | Payer: 59 | Attending: Internal Medicine | Admitting: Internal Medicine

## 2018-04-23 ENCOUNTER — Encounter (HOSPITAL_BASED_OUTPATIENT_CLINIC_OR_DEPARTMENT_OTHER): Payer: Self-pay | Admitting: *Deleted

## 2018-04-23 DIAGNOSIS — K7460 Unspecified cirrhosis of liver: Secondary | ICD-10-CM | POA: Diagnosis present

## 2018-04-23 DIAGNOSIS — Z8261 Family history of arthritis: Secondary | ICD-10-CM

## 2018-04-23 DIAGNOSIS — E876 Hypokalemia: Secondary | ICD-10-CM | POA: Diagnosis present

## 2018-04-23 DIAGNOSIS — E559 Vitamin D deficiency, unspecified: Secondary | ICD-10-CM | POA: Diagnosis present

## 2018-04-23 DIAGNOSIS — M1990 Unspecified osteoarthritis, unspecified site: Secondary | ICD-10-CM | POA: Diagnosis present

## 2018-04-23 DIAGNOSIS — I4729 Other ventricular tachycardia: Secondary | ICD-10-CM

## 2018-04-23 DIAGNOSIS — E785 Hyperlipidemia, unspecified: Secondary | ICD-10-CM | POA: Diagnosis present

## 2018-04-23 DIAGNOSIS — K580 Irritable bowel syndrome with diarrhea: Secondary | ICD-10-CM | POA: Diagnosis present

## 2018-04-23 DIAGNOSIS — I471 Supraventricular tachycardia: Secondary | ICD-10-CM | POA: Diagnosis present

## 2018-04-23 DIAGNOSIS — R600 Localized edema: Secondary | ICD-10-CM

## 2018-04-23 DIAGNOSIS — K2100 Gastro-esophageal reflux disease with esophagitis, without bleeding: Secondary | ICD-10-CM

## 2018-04-23 DIAGNOSIS — K7581 Nonalcoholic steatohepatitis (NASH): Secondary | ICD-10-CM | POA: Diagnosis present

## 2018-04-23 DIAGNOSIS — E8770 Fluid overload, unspecified: Secondary | ICD-10-CM | POA: Diagnosis present

## 2018-04-23 DIAGNOSIS — J948 Other specified pleural conditions: Secondary | ICD-10-CM | POA: Diagnosis present

## 2018-04-23 DIAGNOSIS — Z9889 Other specified postprocedural states: Secondary | ICD-10-CM

## 2018-04-23 DIAGNOSIS — Z8379 Family history of other diseases of the digestive system: Secondary | ICD-10-CM

## 2018-04-23 DIAGNOSIS — Z96651 Presence of right artificial knee joint: Secondary | ICD-10-CM | POA: Diagnosis present

## 2018-04-23 DIAGNOSIS — R52 Pain, unspecified: Secondary | ICD-10-CM

## 2018-04-23 DIAGNOSIS — R0602 Shortness of breath: Secondary | ICD-10-CM

## 2018-04-23 DIAGNOSIS — K219 Gastro-esophageal reflux disease without esophagitis: Secondary | ICD-10-CM | POA: Diagnosis present

## 2018-04-23 DIAGNOSIS — J90 Pleural effusion, not elsewhere classified: Secondary | ICD-10-CM

## 2018-04-23 DIAGNOSIS — Z807 Family history of other malignant neoplasms of lymphoid, hematopoietic and related tissues: Secondary | ICD-10-CM

## 2018-04-23 DIAGNOSIS — R0902 Hypoxemia: Secondary | ICD-10-CM

## 2018-04-23 DIAGNOSIS — I10 Essential (primary) hypertension: Secondary | ICD-10-CM

## 2018-04-23 DIAGNOSIS — Z87442 Personal history of urinary calculi: Secondary | ICD-10-CM

## 2018-04-23 DIAGNOSIS — J9601 Acute respiratory failure with hypoxia: Secondary | ICD-10-CM | POA: Diagnosis not present

## 2018-04-23 DIAGNOSIS — K589 Irritable bowel syndrome without diarrhea: Secondary | ICD-10-CM | POA: Diagnosis present

## 2018-04-23 DIAGNOSIS — Z7982 Long term (current) use of aspirin: Secondary | ICD-10-CM

## 2018-04-23 DIAGNOSIS — Z8249 Family history of ischemic heart disease and other diseases of the circulatory system: Secondary | ICD-10-CM

## 2018-04-23 DIAGNOSIS — I472 Ventricular tachycardia: Secondary | ICD-10-CM

## 2018-04-23 DIAGNOSIS — R188 Other ascites: Secondary | ICD-10-CM

## 2018-04-23 DIAGNOSIS — Z833 Family history of diabetes mellitus: Secondary | ICD-10-CM

## 2018-04-23 DIAGNOSIS — J918 Pleural effusion in other conditions classified elsewhere: Secondary | ICD-10-CM | POA: Diagnosis present

## 2018-04-23 LAB — COMPREHENSIVE METABOLIC PANEL
ALBUMIN: 2.9 g/dL — AB (ref 3.5–5.0)
ALK PHOS: 94 U/L (ref 38–126)
ALT: 15 U/L (ref 14–54)
AST: 47 U/L — AB (ref 15–41)
Anion gap: 11 (ref 5–15)
BILIRUBIN TOTAL: 1.9 mg/dL — AB (ref 0.3–1.2)
BUN: 8 mg/dL (ref 6–20)
CALCIUM: 8.6 mg/dL — AB (ref 8.9–10.3)
CO2: 25 mmol/L (ref 22–32)
Chloride: 102 mmol/L (ref 101–111)
Creatinine, Ser: 0.7 mg/dL (ref 0.44–1.00)
GFR calc Af Amer: >60 mL/min (ref >60)
GFR calc non Af Amer: >60 mL/min (ref >60)
GLUCOSE: 126 mg/dL — AB (ref 65–99)
POTASSIUM: 3.2 mmol/L — AB (ref 3.5–5.1)
Sodium: 138 mmol/L (ref 135–145)
TOTAL PROTEIN: 6.7 g/dL (ref 6.5–8.1)

## 2018-04-23 LAB — CBC WITH DIFFERENTIAL/PLATELET
BASOS ABS: 0 10*3/uL (ref 0.0–0.1)
Basophils Relative: 1 %
EOS PCT: 3 %
Eosinophils Absolute: 0.2 10*3/uL (ref 0.0–0.7)
HCT: 39.6 % (ref 36.0–46.0)
Hemoglobin: 13 g/dL (ref 12.0–15.0)
Lymphocytes Relative: 30 %
Lymphs Abs: 2 10*3/uL (ref 0.7–4.0)
MCH: 30 pg (ref 26.0–34.0)
MCHC: 32.8 g/dL (ref 30.0–36.0)
MCV: 91.5 fL (ref 78.0–100.0)
MONO ABS: 0.8 10*3/uL (ref 0.1–1.0)
Monocytes Relative: 11 %
Neutro Abs: 3.7 10*3/uL (ref 1.7–7.7)
Neutrophils Relative %: 55 %
PLATELETS: 227 10*3/uL (ref 150–400)
RBC: 4.33 MIL/uL (ref 3.87–5.11)
RDW: 16 % — AB (ref 11.5–15.5)
WBC: 6.7 10*3/uL (ref 4.0–10.5)

## 2018-04-23 LAB — I-STAT CG4 LACTIC ACID, ED
Lactic Acid, Venous: 3.15 mmol/L (ref 0.5–1.9)
Lactic Acid, Venous: 1.93 mmol/L — ABNORMAL HIGH (ref 0.5–1.9)

## 2018-04-23 MED ORDER — IOPAMIDOL (ISOVUE-370) INJECTION 76%
100.0000 mL | Freq: Once | INTRAVENOUS | Status: AC | PRN
  Administered 2018-04-23: 65 mL via INTRAVENOUS

## 2018-04-23 MED ORDER — VANCOMYCIN HCL 10 G IV SOLR
1250.0000 mg | Freq: Two times a day (BID) | INTRAVENOUS | Status: DC
  Filled 2018-04-23: qty 1250

## 2018-04-23 MED ORDER — VANCOMYCIN HCL 1000 MG IV SOLR
INTRAVENOUS | Status: AC
  Filled 2018-04-23: qty 2000

## 2018-04-23 MED ORDER — SODIUM CHLORIDE 0.9 % IV SOLN
2.0000 g | Freq: Three times a day (TID) | INTRAVENOUS | Status: DC
  Filled 2018-04-23: qty 2

## 2018-04-23 MED ORDER — VANCOMYCIN HCL 10 G IV SOLR
2000.0000 mg | Freq: Once | INTRAVENOUS | Status: AC
  Administered 2018-04-23: 2000 mg via INTRAVENOUS
  Filled 2018-04-23: qty 2000

## 2018-04-23 MED ORDER — SODIUM CHLORIDE 0.9 % IV SOLN
2.0000 g | Freq: Once | INTRAVENOUS | Status: AC
  Administered 2018-04-23: 2 g via INTRAVENOUS

## 2018-04-23 MED ORDER — CEFEPIME HCL 2 G IJ SOLR
INTRAMUSCULAR | Status: AC
  Filled 2018-04-23: qty 2

## 2018-04-23 MED ORDER — VANCOMYCIN HCL IN DEXTROSE 1-5 GM/200ML-% IV SOLN: 1000.0000 mg | Freq: Once | INTRAVENOUS | Status: DC

## 2018-04-23 NOTE — ED Notes (Signed)
Panic LAC of 3.15 hand deliveres to Dr. Tamera Punt @ 2115

## 2018-04-23 NOTE — ED Notes (Signed)
This RT saw pt in triage, BLB clear on left diminished on right all fields. SATS 88-93% on room air.

## 2018-04-23 NOTE — ED Triage Notes (Signed)
Pt reports SOB today, worse with exertion. Assessed by RT in triage

## 2018-04-23 NOTE — ED Notes (Signed)
States last BM yesterday; states normal bm

## 2018-04-23 NOTE — ED Notes (Signed)
Transported to XR with RN at bedside.

## 2018-04-23 NOTE — ED Notes (Signed)
ED Provider at bedside. 

## 2018-04-23 NOTE — ED Notes (Signed)
Patient transported to X-ray 

## 2018-04-23 NOTE — Plan of Care (Addendum)
62 yo F, h/o NASH, knee replacement last month.  Never had ascites before.  Presents to ED with SOB.  Found to have large volume ascites and large R pleural effusion, atelectasis of all 3 lobes, especially lower, no PE.  No fever, no WBC.  EDP started HCAP ABx.  Lactate 3.  Mild tachycardia.  Will send to tele.  Have ordered pro calcitonin to help decide on wether to cont abx.  Have ordered IR thoracentesis.  Addendum: patient apparently with increased WOB, they are trying BIPAP and I am asked to put her in SDU by EDP.  Will order SDU bed.  If not stable then may require more urgent thoracentesis for the very large effusion tonight.  Did make PCCM aware of this possibility.

## 2018-04-23 NOTE — Progress Notes (Signed)
Pharmacy Antibiotic Note  Dawn Moon is a 62 y.o. female admitted on 04/23/2018 with pneumonia.  Pharmacy has been consulted for vancomycin and cefepime dosing. Cefepime 2gm ordered in the ED  Plan: Change initial vancomycin dose to 2gm x 1 then 1250 IV q12 hours Continue cefepime 2gm IV q8 hours F/u renal function, cultures and clinical course  Height: 5' 4"  (162.6 cm) Weight: 217 lb (98.4 kg) IBW/kg (Calculated) : 54.7  Temp (24hrs), Avg:98.4 F (36.9 C), Min:98.3 F (36.8 C), Max:98.5 F (36.9 C)  Recent Labs  Lab 04/23/18 2111 04/23/18 2112  WBC 6.7  --   CREATININE 0.70  --   LATICACIDVEN  --  3.15*    Estimated Creatinine Clearance: 84.2 mL/min (by C-G formula based on SCr of 0.7 mg/dL).    No Known Allergies   Thank you for allowing pharmacy to be a part of this patient's care.  Beverlee Nims 04/23/2018 10:38 PM

## 2018-04-23 NOTE — ED Provider Notes (Signed)
[facility] EMERGENCY DEPARTMENT Provider Note   CSN: [id number] Arrival date & time: 04/23/18  2041     History   Chief Complaint Chief Complaint  Patient presents with  . Shortness of Breath    HPI Dawn Moon is a 62 y.o. female.  Patient is a 62 year old female here to has a history of hypertension, irritable bowel syndrome and NASH who presents with shortness of breath.  She had a recent total knee replacement on April 4.  She states her knee has been doing better.  She states today she started feeling suddenly short of breath.  It progressed throughout the day.  She denies any symptoms yesterday.  She has a little bit of a nonproductive cough.  No fevers.  No nausea or vomiting.  Her abdomen appears to be distended but she states that normally similar to what it is today.  She denies any change in bowel habits.  No urinary symptoms.  She did have some pain in her right chest earlier today, somewhat worse with deep breathing.  He denies any history of underlying lung disease.  She is not on oxygen at home.     Past Medical History:  Diagnosis Date  . GERD (gastroesophageal reflux disease)   . History of exercise stress test    03-07-2010  Stress echo--- no arrhythmias or conduction abnormalilites and negative for ischemia or chest pain  . History of kidney stones   . History of paroxysmal supraventricular tachycardia    episode 2011  consult w/ dr Caryl Comes --  put on atenolol--  per pt no longer an issue  . HTN (hypertension)    cardiologist --  dr Stanford Breed  . IBS (irritable bowel syndrome)    diarrhea  . Nonalcoholic steatohepatitis (NASH)   . Numbness and tingling of left lower extremity    post achilles tendon repair  . OA (osteoarthritis)    knees  . PONV (postoperative nausea and vomiting)   . Vitamin D deficiency   . Wears glasses     Patient Active Problem List   Diagnosis Date Noted  . Cirrhosis of liver with ascites ([place]) 04/23/2018  .  Osteoarthritis of right knee 03/24/2018  . IBS (irritable bowel syndrome) 06/15/2017  . GERD (gastroesophageal reflux disease) 06/15/2017  . HLD (hyperlipidemia)   . Fatty liver   . NSVT (nonsustained ventricular tachycardia) ([place])   . Nephrolithiasis   . Essential hypertension 02/13/2010    Past Surgical History:  Procedure Laterality Date  . ACHILLES TENDON SURGERY Left 06/2016  . BREAST BIOPSY Left 02/2016   benign  . COLONOSCOPY  2005  . CT CTA CORONARY W/CA SCORE W/CM &/OR WO/CM  01/01/2014   non-obstructive calcified plaque in pLAD (0-25%), no significant incidental noncardiac findings noted  . ESOPHAGOGASTRODUODENOSCOPY  01/2014  . EXTRACORPOREAL SHOCK WAVE LITHOTRIPSY  2010  . KNEE ARTHROPLASTY Right 03/24/2018   Procedure: RIGHT TOTAL KNEE ARTHROPLASTY WITH COMPUTER NAVIGATION;  Surgeon: Rod Can, MD;  Location: [facility];  Service: Orthopedics;  Laterality: Right;  Needs RNFA  . KNEE ARTHROSCOPY Right 12/04/2016   Procedure: ARTHROSCOPY KNEE WITH PARTIAL MEDIAL MENISCECTOMY;  Surgeon: Rod Can, MD;  Location: [facility];  Service: Orthopedics;  Laterality: Right;  . LEFT HEART CATHETERIZATION WITH CORONARY ANGIOGRAM N/A 01/11/2012   Procedure: LEFT HEART CATHETERIZATION WITH CORONARY ANGIOGRAM;  Surgeon: Sherren Mocha, MD;  Location: [facility] CATH LAB;  Service: Cardiovascular;  Laterality: N/A;  widely patent coronary arterires without significant obstructive CAD,  normal LVF, ef 55-56%  . TONSILLECTOMY AND ADENOIDECTOMY  child  . TRANSTHORACIC ECHOCARDIOGRAM  01/18/2012   mild LVH,  ef 60%/  trivial TR  . TUBAL LIGATION  1985     OB History   None      Home Medications    Prior to Admission medications   Medication Sig Start Date End Date Taking? Authorizing Provider  aspirin 81 MG chewable tablet Chew 1 tablet (81 mg total) by mouth 2 (two) times daily. 03/25/18   Swinteck, Aaron Edelman, MD  atenolol (TENORMIN) 50 MG tablet Take 1 tablet (50 mg total)  by mouth every morning. Patient taking differently: Take 50 mg by mouth every morning.  12/10/17   Hawks, Christy A, FNP  Butalbital-APAP-Caffeine 50-300-40 MG CAPS Take 1 tablet by mouth every 8 (eight) hours as needed. Patient taking differently: Take 1 tablet by mouth every 8 (eight) hours as needed (for pain.).  01/10/18   Sharion Balloon, FNP  docusate sodium (COLACE) 100 MG capsule Take 1 capsule (100 mg total) by mouth 2 (two) times daily. 03/25/18   Swinteck, Aaron Edelman, MD  Eluxadoline (VIBERZI) 75 MG TABS Take 75 mg by mouth 2 (two) times daily. Patient taking differently: Take 75 mg by mouth 2 (two) times daily.  12/10/17   Sharion Balloon, FNP  furosemide (LASIX) 20 MG tablet Take 1 tablet (20 mg total) by mouth every morning. Patient taking up to TID prn Patient taking differently: Take 40 mg by mouth daily. Patient taking up to TID prn 04/22/18   Leamon Arnt, MD  HYDROcodone-acetaminophen (NORCO/VICODIN) 5-325 MG tablet Take 1-2 tablets by mouth every 4 (four) hours as needed. 03/25/18   Swinteck, Aaron Edelman, MD  oxyCODONE (OXYCONTIN) 10 mg 12 hr tablet Take 1 tablet (10 mg total) by mouth PRO. 1 tab PO every 12 hours for 3 days, then 1 tab PO daily for 4 days 03/25/18   Rod Can, MD  pantoprazole (PROTONIX) 40 MG tablet Take 1 tablet (40 mg total) by mouth every morning. 12/10/17   Evelina Dun A, FNP  potassium citrate (UROCIT-K) 10 MEQ (1080 MG) SR tablet Take 1 tablet (10 mEq total) by mouth See admin instructions. Take 1 tablet (20 mg) by mouth scheduled in the morning, & may repeat 1 tablet (20 mg) up to twice daily if needed for fluid retention. 04/22/18   Leamon Arnt, MD  promethazine (PHENERGAN) 12.5 MG tablet Take 12.5 mg by mouth every 6 (six) hours as needed for nausea or vomiting (per pt takes w/ tramadol to prevent nausea).     [provider]  senna (SENOKOT) 8.6 MG TABS tablet Take 2 tablets (17.2 mg total) by mouth at bedtime. 03/25/18   Rod Can, MD     Family History Family History  Problem Relation Age of Onset  . Lymphoma Mother        non-hodgkins - died @ 55  . Coronary artery disease Mother   . Coronary artery disease Father        CABG in his 29s, died @ 67  . Diabetes Father   . Healthy Brother   . Hypertension Sister   . Arthritis Sister   . IgA nephropathy Daughter   . Celiac disease Daughter     Social History Social History   Tobacco Use  . Smoking status: Never Smoker  . Smokeless tobacco: Never Used  Substance Use Topics  . Alcohol use: Yes    Comment: Rare  . Drug use:  No     Allergies   Patient has no known allergies.   Review of Systems Review of Systems  Constitutional: Positive for fatigue. Negative for chills, diaphoresis and fever.  HENT: Negative for congestion, rhinorrhea and sneezing.   Eyes: Negative.   Respiratory: Positive for shortness of breath. Negative for cough and chest tightness.   Cardiovascular: Positive for chest pain. Negative for leg swelling.  Gastrointestinal: Negative for abdominal pain, blood in stool, diarrhea, nausea and vomiting.  Genitourinary: Negative for difficulty urinating, flank pain, frequency and hematuria.  Musculoskeletal: Negative for arthralgias and back pain.  Skin: Negative for rash.  Neurological: Negative for dizziness, speech difficulty, weakness, numbness and headaches.     Physical Exam Updated Vital Signs BP (!) 156/96 (BP Location: Right Arm)   Pulse (!) 105   Temp 98.7 F (37.1 C) (Oral)   Resp (!) 32   Ht 5' 4"  (1.626 m)   Wt 98.4 kg (217 lb)   SpO2 95%   BMI 37.25 kg/m   Physical Exam  Constitutional: She is oriented to person, place, and time. She appears well-developed and well-nourished.  HENT:  Head: Normocephalic and atraumatic.  Eyes: Pupils are equal, round, and reactive to light.  Neck: Normal range of motion. Neck supple.  Cardiovascular: Regular rhythm and normal heart sounds. Tachycardia present.   Pulmonary/Chest: Accessory muscle usage present. Tachypnea noted. No respiratory distress. She has no wheezes. She has no rales. She exhibits no tenderness.  Diminished breath sounds to the right lung  Abdominal: Soft. Bowel sounds are normal. She exhibits distension. There is no tenderness. There is no rebound and no guarding.  Musculoskeletal: Normal range of motion. She exhibits edema.  Patient has some mild tenderness in palpation of the right knee.  There is no warmth or erythema.  There is a healing surgical incision over the anterior portion of the knee  Lymphadenopathy:    She has no cervical adenopathy.  Neurological: She is alert and oriented to person, place, and time.  Skin: Skin is warm and dry. No rash noted.  Psychiatric: She has a normal mood and affect.     ED Treatments / Results  Labs (all labs ordered are listed, but only abnormal results are displayed) Labs Reviewed  COMPREHENSIVE METABOLIC PANEL - Abnormal; Notable for the following components:      Result Value   Potassium 3.2 (*)    Glucose, Bld 126 (*)    Calcium 8.6 (*)    Albumin 2.9 (*)    AST 47 (*)    Total Bilirubin 1.9 (*)    All other components within normal limits  CBC WITH DIFFERENTIAL/PLATELET - Abnormal; Notable for the following components:   RDW 16.0 (*)    All other components within normal limits  I-STAT CG4 LACTIC ACID, ED - Abnormal; Notable for the following components:   Lactic Acid, Venous 3.15 (*)    All other components within normal limits  I-STAT CG4 LACTIC ACID, ED - Abnormal; Notable for the following components:   Lactic Acid, Venous 1.93 (*)    All other components within normal limits  CULTURE, BLOOD (ROUTINE X 2)  CULTURE, BLOOD (ROUTINE X 2)  URINALYSIS, ROUTINE W REFLEX MICROSCOPIC  PROCALCITONIN  I-STAT CG4 LACTIC ACID, ED    EKG EKG Interpretation  Date/Time:  Saturday Apr 23 2018 21:09:14 EDT Ventricular Rate:  102 PR Interval:    QRS Duration: 74 QT  Interval:  356 QTC Calculation: 464 R Axis:   27  Text Interpretation:  Sinus tachycardia Low voltage, precordial leads Minimal ST depression SINCE LAST TRACING HEART RATE HAS INCREASED Confirmed by Malvin Johns [phone]) on 04/23/2018 10:31:21 PM   Radiology Dg Chest 2 View  Result Date: 04/23/2018 CLINICAL DATA:  62 year old female with acute onset of cough and right-sided chest pain and shortness of breath. EXAM: CHEST - 2 VIEW COMPARISON:  Chest radiograph dated 01/20/2018 FINDINGS: There is a moderate size right-sided pleural effusion, new compared to the prior radiograph. There is associated compressive atelectasis of the majority of the right lower and right middle lobes. There is a small aerated portion of the lung in the right upper lobe. The left lung is clear. There is no pneumothorax. The right cardiac borders are silhouetted. No acute osseous pathology. IMPRESSION: Moderate right pleural effusion with compressive atelectasis of the majority of the right lung, new compared to the prior radiograph. Findings may be related to an infectious etiology although underlying mass is not entirely excluded. Clinical correlation and follow-up to resolution recommended. CT may provide additional evaluation if thoracentesis is considered for symptom relief. Electronically Signed   By: Anner Crete M.D.   On: 04/23/2018 21:36   Ct Angio Chest Pe W/cm &/or Wo Cm  Result Date: 04/23/2018 CLINICAL DATA:  62 y/o F; shortness of breath and abdominal pain with bloating. Knee surgery 1 month ago. EXAM: CT ANGIOGRAPHY CHEST CT ABDOMEN AND PELVIS WITH CONTRAST TECHNIQUE: Multidetector CT imaging of the chest was performed using the standard protocol during bolus administration of intravenous contrast. Multiplanar CT image reconstructions and MIPs were obtained to evaluate the vascular anatomy. Multidetector CT imaging of the abdomen and pelvis was performed using the standard protocol during bolus administration of  intravenous contrast. CONTRAST:  22m ISOVUE-370 IOPAMIDOL (ISOVUE-370) INJECTION 76% COMPARISON:  06/10/2011 CT abdomen and pelvis. FINDINGS: CTA CHEST FINDINGS Cardiovascular: Satisfactory opacification of pulmonary arteries. Respiratory motion artifact. No pulmonary embolus identified. Mild coronary artery calcification. Normal heart size. No pericardial effusion. Normal caliber thoracic aorta. Mediastinum/Nodes: No enlarged mediastinal, hilar, or axillary lymph nodes. Thyroid gland, trachea, and esophagus demonstrate no significant findings. Lungs/Pleura: Large right pleural effusion with atelectasis of right lower lobe and partial atelectasis of the right upper lobes and middle lobe. Clear left lung. Musculoskeletal: No chest wall abnormality. No acute or significant osseous findings. Review of the MIP images confirms the above findings. CT ABDOMEN and PELVIS FINDINGS Hepatobiliary: Lobulation of liver contour with enlargement of the left lobe compatible with cirrhosis. Normal gallbladder. No biliary ductal dilatation. Pancreas: Unremarkable. No pancreatic ductal dilatation or surrounding inflammatory changes. Spleen: Normal in size without focal abnormality. Adrenals/Urinary Tract: Adrenal glands are unremarkable. Kidneys are normal, without renal calculi, focal lesion, or hydronephrosis. Bladder is unremarkable. Stomach/Bowel: Stomach is within normal limits. Appendix appears normal. No evidence of bowel wall thickening, distention, or inflammatory changes. Vascular/Lymphatic: No significant vascular findings are present. Enlargement of mesenteric lymph nodes measuring up to 18 x 14 mm (series 13, image 41). Reproductive: Uterus and bilateral adnexa are unremarkable. Other: Large volume of ascites. Musculoskeletal: No fracture is seen. Stable L1 mild anterior compression deformity. Review of the MIP images confirms the above findings. IMPRESSION: 1. Respiratory motion artifact.  No pulmonary embolus  identified. 2. Large right pleural effusion with atelectasis of right lower lobe and partial atelectasis of right upper and middle lobes. 3. Lobulated liver with enlarged left lobe compatible with cirrhosis. 4. Large volume of ascites. 5. Mild enlargement of mesenteric lymph nodes may represent underlying inflammation or be  due to venous congestion. Electronically Signed   By: Kristine Garbe M.D.   On: 04/23/2018 22:12   Ct Abdomen Pelvis W Contrast  Result Date: 04/23/2018 CLINICAL DATA:  62 y/o F; shortness of breath and abdominal pain with bloating. Knee surgery 1 month ago. EXAM: CT ANGIOGRAPHY CHEST CT ABDOMEN AND PELVIS WITH CONTRAST TECHNIQUE: Multidetector CT imaging of the chest was performed using the standard protocol during bolus administration of intravenous contrast. Multiplanar CT image reconstructions and MIPs were obtained to evaluate the vascular anatomy. Multidetector CT imaging of the abdomen and pelvis was performed using the standard protocol during bolus administration of intravenous contrast. CONTRAST:  66m ISOVUE-370 IOPAMIDOL (ISOVUE-370) INJECTION 76% COMPARISON:  06/10/2011 CT abdomen and pelvis. FINDINGS: CTA CHEST FINDINGS Cardiovascular: Satisfactory opacification of pulmonary arteries. Respiratory motion artifact. No pulmonary embolus identified. Mild coronary artery calcification. Normal heart size. No pericardial effusion. Normal caliber thoracic aorta. Mediastinum/Nodes: No enlarged mediastinal, hilar, or axillary lymph nodes. Thyroid gland, trachea, and esophagus demonstrate no significant findings. Lungs/Pleura: Large right pleural effusion with atelectasis of right lower lobe and partial atelectasis of the right upper lobes and middle lobe. Clear left lung. Musculoskeletal: No chest wall abnormality. No acute or significant osseous findings. Review of the MIP images confirms the above findings. CT ABDOMEN and PELVIS FINDINGS Hepatobiliary: Lobulation of liver  contour with enlargement of the left lobe compatible with cirrhosis. Normal gallbladder. No biliary ductal dilatation. Pancreas: Unremarkable. No pancreatic ductal dilatation or surrounding inflammatory changes. Spleen: Normal in size without focal abnormality. Adrenals/Urinary Tract: Adrenal glands are unremarkable. Kidneys are normal, without renal calculi, focal lesion, or hydronephrosis. Bladder is unremarkable. Stomach/Bowel: Stomach is within normal limits. Appendix appears normal. No evidence of bowel wall thickening, distention, or inflammatory changes. Vascular/Lymphatic: No significant vascular findings are present. Enlargement of mesenteric lymph nodes measuring up to 18 x 14 mm (series 13, image 41). Reproductive: Uterus and bilateral adnexa are unremarkable. Other: Large volume of ascites. Musculoskeletal: No fracture is seen. Stable L1 mild anterior compression deformity. Review of the MIP images confirms the above findings. IMPRESSION: 1. Respiratory motion artifact.  No pulmonary embolus identified. 2. Large right pleural effusion with atelectasis of right lower lobe and partial atelectasis of right upper and middle lobes. 3. Lobulated liver with enlarged left lobe compatible with cirrhosis. 4. Large volume of ascites. 5. Mild enlargement of mesenteric lymph nodes may represent underlying inflammation or be due to venous congestion. Electronically Signed   By: LKristine GarbeM.D.   On: 04/23/2018 22:12    Procedures Procedures (including critical care time)  Medications Ordered in ED Medications  vancomycin (VANCOCIN) 2,000 mg in sodium chloride 0.9 % 500 mL IVPB (has no administration in time range)  ceFEPIme (MAXIPIME) 2 g in sodium chloride 0.9 % 100 mL IVPB (has no administration in time range)  vancomycin (VANCOCIN) 1,250 mg in sodium chloride 0.9 % 250 mL IVPB (has no administration in time range)  ceFEPIme (MAXIPIME) 2 g injection (  Not Given 04/23/18 2251)  iopamidol  (ISOVUE-370) 76 % injection 100 mL (65 mLs Intravenous Contrast Given 04/23/18 2135)  ceFEPIme (MAXIPIME) 2 g in sodium chloride 0.9 % 100 mL IVPB (2 g Intravenous New Bag/Given 04/23/18 2251)     Initial Impression / Assessment and Plan / ED Course  I have reviewed the triage vital signs and the nursing notes.  Pertinent labs & imaging results that were available during my care of the patient were reviewed by me and considered in my medical  decision making (see chart for details).     Patient is a 62 year old female who presents with shortness of breath.  Chest x-ray shows a large right pleural effusion.  She had a CT scan of her chest and abdomen which shows no evidence of pulmonary embolus.  No evidence of underlying mass or consolidation.  She does have a large amount of ascites.  Her white count is normal.  She has minimal elevation in her LFTs.  She has an elevated lactate but no fever.  She does have technically sirs criteria for sepsis with tachycardia and tachypnea although I feel this is likely related to her pleural effusion rather than sepsis.  However given the current completely rule out underlying infection with the effusion, she was given IV antibiotics to cover for healthcare associated pneumonia.  She is feeling better on oxygen with nasal cannula.  She still has some mild tachypnea but no significant increased work of breathing.  I spoke with Dr. Alcario Drought with the hospitalist service who has accepted the patient for transfer to [facility].  Final Clinical Impressions(s) / ED Diagnoses   Final diagnoses:  Pleural effusion  SOB (shortness of breath)  Hypoxia  Other ascites    ED Discharge Orders    None       Malvin Johns, MD 04/23/18 2338

## 2018-04-24 ENCOUNTER — Inpatient Hospital Stay (HOSPITAL_COMMUNITY): Payer: 59

## 2018-04-24 ENCOUNTER — Encounter (HOSPITAL_COMMUNITY): Payer: Self-pay | Admitting: Internal Medicine

## 2018-04-24 DIAGNOSIS — Z833 Family history of diabetes mellitus: Secondary | ICD-10-CM | POA: Diagnosis not present

## 2018-04-24 DIAGNOSIS — J90 Pleural effusion, not elsewhere classified: Secondary | ICD-10-CM | POA: Diagnosis not present

## 2018-04-24 DIAGNOSIS — I10 Essential (primary) hypertension: Secondary | ICD-10-CM | POA: Diagnosis not present

## 2018-04-24 DIAGNOSIS — Z96651 Presence of right artificial knee joint: Secondary | ICD-10-CM | POA: Diagnosis present

## 2018-04-24 DIAGNOSIS — J918 Pleural effusion in other conditions classified elsewhere: Secondary | ICD-10-CM | POA: Diagnosis present

## 2018-04-24 DIAGNOSIS — Z8249 Family history of ischemic heart disease and other diseases of the circulatory system: Secondary | ICD-10-CM | POA: Diagnosis not present

## 2018-04-24 DIAGNOSIS — Z807 Family history of other malignant neoplasms of lymphoid, hematopoietic and related tissues: Secondary | ICD-10-CM | POA: Diagnosis not present

## 2018-04-24 DIAGNOSIS — E559 Vitamin D deficiency, unspecified: Secondary | ICD-10-CM | POA: Diagnosis present

## 2018-04-24 DIAGNOSIS — E785 Hyperlipidemia, unspecified: Secondary | ICD-10-CM | POA: Diagnosis present

## 2018-04-24 DIAGNOSIS — K580 Irritable bowel syndrome with diarrhea: Secondary | ICD-10-CM

## 2018-04-24 DIAGNOSIS — Z87442 Personal history of urinary calculi: Secondary | ICD-10-CM | POA: Diagnosis not present

## 2018-04-24 DIAGNOSIS — M1990 Unspecified osteoarthritis, unspecified site: Secondary | ICD-10-CM | POA: Diagnosis present

## 2018-04-24 DIAGNOSIS — I472 Ventricular tachycardia: Secondary | ICD-10-CM | POA: Diagnosis not present

## 2018-04-24 DIAGNOSIS — E8770 Fluid overload, unspecified: Secondary | ICD-10-CM | POA: Diagnosis present

## 2018-04-24 DIAGNOSIS — Z9889 Other specified postprocedural states: Secondary | ICD-10-CM | POA: Diagnosis not present

## 2018-04-24 DIAGNOSIS — R0902 Hypoxemia: Secondary | ICD-10-CM

## 2018-04-24 DIAGNOSIS — J9601 Acute respiratory failure with hypoxia: Secondary | ICD-10-CM | POA: Diagnosis present

## 2018-04-24 DIAGNOSIS — K7581 Nonalcoholic steatohepatitis (NASH): Secondary | ICD-10-CM

## 2018-04-24 DIAGNOSIS — Z8261 Family history of arthritis: Secondary | ICD-10-CM | POA: Diagnosis not present

## 2018-04-24 DIAGNOSIS — J948 Other specified pleural conditions: Secondary | ICD-10-CM | POA: Diagnosis present

## 2018-04-24 DIAGNOSIS — K7460 Unspecified cirrhosis of liver: Secondary | ICD-10-CM

## 2018-04-24 DIAGNOSIS — Z8379 Family history of other diseases of the digestive system: Secondary | ICD-10-CM | POA: Diagnosis not present

## 2018-04-24 DIAGNOSIS — E876 Hypokalemia: Secondary | ICD-10-CM | POA: Diagnosis present

## 2018-04-24 DIAGNOSIS — R188 Other ascites: Secondary | ICD-10-CM

## 2018-04-24 DIAGNOSIS — R600 Localized edema: Secondary | ICD-10-CM | POA: Diagnosis not present

## 2018-04-24 DIAGNOSIS — R0600 Dyspnea, unspecified: Secondary | ICD-10-CM | POA: Diagnosis not present

## 2018-04-24 DIAGNOSIS — R0602 Shortness of breath: Secondary | ICD-10-CM | POA: Diagnosis not present

## 2018-04-24 DIAGNOSIS — Z7982 Long term (current) use of aspirin: Secondary | ICD-10-CM | POA: Diagnosis not present

## 2018-04-24 DIAGNOSIS — K219 Gastro-esophageal reflux disease without esophagitis: Secondary | ICD-10-CM | POA: Diagnosis present

## 2018-04-24 DIAGNOSIS — I471 Supraventricular tachycardia: Secondary | ICD-10-CM | POA: Diagnosis present

## 2018-04-24 LAB — BASIC METABOLIC PANEL
Anion gap: 12 (ref 5–15)
BUN: 8 mg/dL (ref 6–20)
CHLORIDE: 104 mmol/L (ref 101–111)
CO2: 24 mmol/L (ref 22–32)
Calcium: 8.6 mg/dL — ABNORMAL LOW (ref 8.9–10.3)
Creatinine, Ser: 0.55 mg/dL (ref 0.44–1.00)
GFR calc Af Amer: >60 mL/min (ref >60)
Glucose, Bld: 103 mg/dL — ABNORMAL HIGH (ref 65–99)
POTASSIUM: 3.3 mmol/L — AB (ref 3.5–5.1)
SODIUM: 140 mmol/L (ref 135–145)

## 2018-04-24 LAB — MAGNESIUM: MAGNESIUM: 1.6 mg/dL — AB (ref 1.7–2.4)

## 2018-04-24 LAB — BODY FLUID CELL COUNT WITH DIFFERENTIAL
EOS FL: 0 %
LYMPHS FL: 40 %
MONOCYTE-MACROPHAGE-SEROUS FLUID: 56 % (ref 50–90)
NEUTROPHIL FLUID: 4 % (ref 0–25)
WBC FLUID: 724 uL (ref 0–1000)

## 2018-04-24 LAB — PROTIME-INR
INR: 1.31
Prothrombin Time: 16.1 seconds — ABNORMAL HIGH (ref 11.4–15.2)

## 2018-04-24 LAB — PROTEIN, PLEURAL OR PERITONEAL FLUID

## 2018-04-24 LAB — GRAM STAIN

## 2018-04-24 LAB — LACTATE DEHYDROGENASE, PLEURAL OR PERITONEAL FLUID: LD, Fluid: 69 U/L — ABNORMAL HIGH (ref 3–23)

## 2018-04-24 LAB — GLUCOSE, PLEURAL OR PERITONEAL FLUID: Glucose, Fluid: 113 mg/dL

## 2018-04-24 LAB — LACTATE DEHYDROGENASE: LDH: 229 U/L — ABNORMAL HIGH (ref 98–192)

## 2018-04-24 LAB — MRSA PCR SCREENING: MRSA BY PCR: NEGATIVE

## 2018-04-24 LAB — PROCALCITONIN: Procalcitonin: <0.1 ng/mL

## 2018-04-24 LAB — ALBUMIN, PLEURAL OR PERITONEAL FLUID: Albumin, Fluid: 1 g/dL

## 2018-04-24 LAB — LACTIC ACID, PLASMA: Lactic Acid, Venous: 2.1 mmol/L (ref 0.5–1.9)

## 2018-04-24 LAB — HIV ANTIBODY (ROUTINE TESTING W REFLEX): HIV SCREEN 4TH GENERATION: NONREACTIVE

## 2018-04-24 MED ORDER — VANCOMYCIN HCL IN DEXTROSE 1-5 GM/200ML-% IV SOLN: 1000.0000 mg | INTRAVENOUS | Status: DC

## 2018-04-24 MED ORDER — FUROSEMIDE 40 MG PO TABS: 40.0000 mg | ORAL_TABLET | Freq: Every day | ORAL | Status: DC

## 2018-04-24 MED ORDER — ALBUMIN HUMAN 25 % IV SOLN
12.5000 g | Freq: Four times a day (QID) | INTRAVENOUS | Status: AC
  Administered 2018-04-25 – 2018-04-26 (×3): 12.5 g via INTRAVENOUS
  Filled 2018-04-24 (×2): qty 50
  Filled 2018-04-24: qty 100

## 2018-04-24 MED ORDER — ATENOLOL 25 MG PO TABS
50.0000 mg | ORAL_TABLET | Freq: Every morning | ORAL | Status: DC
  Administered 2018-04-24 – 2018-05-01 (×8): 50 mg via ORAL
  Filled 2018-04-24 (×9): qty 2

## 2018-04-24 MED ORDER — ASPIRIN 81 MG PO CHEW
81.0000 mg | CHEWABLE_TABLET | Freq: Two times a day (BID) | ORAL | Status: DC
  Administered 2018-04-24 – 2018-04-27 (×7): 81 mg via ORAL
  Filled 2018-04-24 (×7): qty 1

## 2018-04-24 MED ORDER — FUROSEMIDE 10 MG/ML IJ SOLN
40.0000 mg | Freq: Two times a day (BID) | INTRAMUSCULAR | Status: DC
  Administered 2018-04-24 – 2018-04-26 (×4): 40 mg via INTRAVENOUS
  Filled 2018-04-24 (×4): qty 4

## 2018-04-24 MED ORDER — ELUXADOLINE 75 MG PO TABS
75.0000 mg | ORAL_TABLET | Freq: Two times a day (BID) | ORAL | Status: DC
Start: 2018-04-24 — End: 2018-04-29
  Administered 2018-04-24 – 2018-04-28 (×6): 75 mg via ORAL

## 2018-04-24 MED ORDER — FUROSEMIDE 40 MG PO TABS
40.0000 mg | ORAL_TABLET | Freq: Two times a day (BID) | ORAL | Status: DC
  Administered 2018-04-24: 40 mg via ORAL
  Filled 2018-04-24: qty 1

## 2018-04-24 MED ORDER — SODIUM CHLORIDE 0.9 % IV SOLN
2.0000 g | Freq: Three times a day (TID) | INTRAVENOUS | Status: DC
  Administered 2018-04-24: 2 g via INTRAVENOUS
  Filled 2018-04-24 (×2): qty 2

## 2018-04-24 MED ORDER — ENOXAPARIN SODIUM 40 MG/0.4ML ~~LOC~~ SOLN
40.0000 mg | SUBCUTANEOUS | Status: DC
  Administered 2018-04-24 – 2018-05-03 (×10): 40 mg via SUBCUTANEOUS
  Filled 2018-04-24 (×10): qty 0.4

## 2018-04-24 MED ORDER — ACETAMINOPHEN 325 MG PO TABS
650.0000 mg | ORAL_TABLET | Freq: Four times a day (QID) | ORAL | Status: DC | PRN
  Administered 2018-04-25 – 2018-04-26 (×2): 650 mg via ORAL
  Filled 2018-04-24 (×2): qty 2

## 2018-04-24 MED ORDER — ACETAMINOPHEN 650 MG RE SUPP: 650.0000 mg | Freq: Four times a day (QID) | RECTAL | Status: DC | PRN

## 2018-04-24 MED ORDER — ONDANSETRON HCL 4 MG PO TABS: 4.0000 mg | ORAL_TABLET | Freq: Four times a day (QID) | ORAL | Status: DC | PRN

## 2018-04-24 MED ORDER — SPIRONOLACTONE 25 MG PO TABS
100.0000 mg | ORAL_TABLET | Freq: Every day | ORAL | Status: DC
  Administered 2018-04-25 – 2018-04-26 (×2): 100 mg via ORAL
  Filled 2018-04-24 (×2): qty 4

## 2018-04-24 MED ORDER — SPIRONOLACTONE 25 MG PO TABS
50.0000 mg | ORAL_TABLET | Freq: Every day | ORAL | Status: DC
  Administered 2018-04-24: 50 mg via ORAL
  Filled 2018-04-24: qty 2

## 2018-04-24 MED ORDER — FENTANYL CITRATE (PF) 100 MCG/2ML IJ SOLN
25.0000 ug | INTRAMUSCULAR | Status: DC | PRN
  Administered 2018-04-24 (×2): 50 ug via INTRAVENOUS
  Filled 2018-04-24 (×2): qty 2

## 2018-04-24 MED ORDER — PANTOPRAZOLE SODIUM 40 MG PO TBEC
40.0000 mg | DELAYED_RELEASE_TABLET | Freq: Every morning | ORAL | Status: DC
  Administered 2018-04-24 – 2018-05-03 (×10): 40 mg via ORAL
  Filled 2018-04-24 (×10): qty 1

## 2018-04-24 MED ORDER — PROMETHAZINE HCL 25 MG PO TABS
12.5000 mg | ORAL_TABLET | Freq: Four times a day (QID) | ORAL | Status: DC | PRN
Start: 2018-04-24 — End: 2018-05-03
  Administered 2018-04-24: 12.5 mg via ORAL
  Filled 2018-04-24: qty 1

## 2018-04-24 MED ORDER — ONDANSETRON HCL 4 MG/2ML IJ SOLN
4.0000 mg | Freq: Four times a day (QID) | INTRAMUSCULAR | Status: DC | PRN
  Administered 2018-05-02: 4 mg via INTRAVENOUS
  Filled 2018-04-24 (×2): qty 2

## 2018-04-24 MED ORDER — LIDOCAINE HCL 1 % IJ SOLN
INTRAMUSCULAR | Status: AC
  Filled 2018-04-24: qty 10

## 2018-04-24 NOTE — Procedures (Signed)
PROCEDURE SUMMARY:  Successful US guided right thoracentesis. Yielded 2 liters of cloudy yellow fluid. Patient tolerated procedure well. No immediate complications.  Specimen was sent for labs.  Post procedure chest X-ray reveals no pneumothorax  Calder Oblinger S Shawn Dannenberg PA-C 04/24/2018 11:49 AM

## 2018-04-24 NOTE — Progress Notes (Signed)
Pharmacy Antibiotic Note  Dawn Moon is a 62 y.o. female admitted on 04/23/2018 with pneumonia.  Pharmacy has been consulted for vancomycin and cefepime dosing. Cefepime 2gm ordered in the ED  Plan: Vancomycin 2 gm x1 (Given ED) then 1 Gm IV q24h for est AUC=461 Goal AUC = 400-500 Continue cefepime 2gm IV q8 hours F/u renal function, cultures and clinical course  Height: 5' 4"  (162.6 cm) Weight: 217 lb (98.4 kg) IBW/kg (Calculated) : 54.7  Temp (24hrs), Avg:98.6 F (37 C), Min:98.3 F (36.8 C), Max:99 F (37.2 C)  Recent Labs  Lab 04/23/18 2111 04/23/18 2112 04/23/18 2324  WBC 6.7  --   --   CREATININE 0.70  --   --   LATICACIDVEN  --  3.15* 1.93*    Estimated Creatinine Clearance: 84.2 mL/min (by C-G formula based on SCr of 0.7 mg/dL).    No Known Allergies   Thank you for allowing pharmacy to be a part of this patient's care.  Dorrene German 04/24/2018 2:51 AM

## 2018-04-24 NOTE — Progress Notes (Signed)
MD notified of lactic level of 2.1 at 1156. New orders placed. Hale Bogus.

## 2018-04-24 NOTE — Progress Notes (Signed)
Per Dr. Wynelle Cleveland, it's OK for pt to take home dose of viberzi.

## 2018-04-24 NOTE — ED Notes (Signed)
EDP made aware of CXR appearance. New orders placed for CT angio.

## 2018-04-24 NOTE — ED Notes (Addendum)
Pt continues to have dyspnea at rest. States " I am getting very tired." RRT called to bedside to assess. EDP also called to bedside to assess. Pt pulled up in bed. No new orders given. Will continue to monitor.

## 2018-04-24 NOTE — ED Notes (Signed)
[facility] on site. Pt unable to say >1 word without taking a breath. EDP called in to assess. EDP to address bed status. V/O to start Bipap if patient is agreeable for comfort/work of breathing.

## 2018-04-24 NOTE — Consult Note (Signed)
Cardiology Consultation:   Patient ID: Dawn Moon; [id number]; Apr 24, 1956   Admit date: 04/23/2018 Date of Consult: 04/24/2018  Primary Care Provider: Leamon Arnt, MD Primary Cardiologist: Stanford Breed Primary Electrophysiologist:  Allred    Patient Profile:   Dawn Moon is a 62 y.o. female with a hx of HTN who is being seen today for the evaluation of  SOB at the request of Dr Tamera Punt  History of Present Illness:   Ms. Dawn Moon is a 62 yo with hsitory of HTN and NASH Last seen by GI about 10 years ago On April 4, she underwent R TKA  Did receive tx of blood with procedure After discharge the pt developed significant sweling of R leg   Went up on lasix to 40 daily    Pt was going through rehab for knee  The pt did say she was tired often, attrib to being deconditioned  Yesterday she became acutely SOB  Denied cough or fever. She did complain of some R sided pleuritic CP  Symptoms continued to worsen and 911 called  Brought to [facility] Treated with BiPAP   Today underwent thoracenteis of approximately 1.7 L pleural fluid.  Breathing is much better    Past Medical History:  Diagnosis Date  . GERD (gastroesophageal reflux disease)   . History of exercise stress test    03-07-2010  Stress echo--- no arrhythmias or conduction abnormalilites and negative for ischemia or chest pain  . History of kidney stones   . History of paroxysmal supraventricular tachycardia    episode 2011  consult w/ dr Caryl Comes --  put on atenolol--  per pt no longer an issue  . HTN (hypertension)    cardiologist --  dr Stanford Breed  . IBS (irritable bowel syndrome)    diarrhea  . Nonalcoholic steatohepatitis (NASH)   . Numbness and tingling of left lower extremity    post achilles tendon repair  . OA (osteoarthritis)    knees  . PONV (postoperative nausea and vomiting)   . Vitamin D deficiency   . Wears glasses     Past Surgical History:  Procedure Laterality Date  . ACHILLES TENDON SURGERY Left  06/2016  . BREAST BIOPSY Left 02/2016   benign  . COLONOSCOPY  2005  . CT CTA CORONARY W/CA SCORE W/CM &/OR WO/CM  01/01/2014   non-obstructive calcified plaque in pLAD (0-25%), no significant incidental noncardiac findings noted  . ESOPHAGOGASTRODUODENOSCOPY  01/2014  . EXTRACORPOREAL SHOCK WAVE LITHOTRIPSY  2010  . KNEE ARTHROPLASTY Right 03/24/2018   Procedure: RIGHT TOTAL KNEE ARTHROPLASTY WITH COMPUTER NAVIGATION;  Surgeon: Rod Can, MD;  Location: [facility];  Service: Orthopedics;  Laterality: Right;  Needs RNFA  . KNEE ARTHROSCOPY Right 12/04/2016   Procedure: ARTHROSCOPY KNEE WITH PARTIAL MEDIAL MENISCECTOMY;  Surgeon: Rod Can, MD;  Location: [facility];  Service: Orthopedics;  Laterality: Right;  . LEFT HEART CATHETERIZATION WITH CORONARY ANGIOGRAM N/A 01/11/2012   Procedure: LEFT HEART CATHETERIZATION WITH CORONARY ANGIOGRAM;  Surgeon: Sherren Mocha, MD;  Location: [facility] CATH LAB;  Service: Cardiovascular;  Laterality: N/A;  widely patent coronary arterires without significant obstructive CAD,  normal LVF, ef 55-56%  . TONSILLECTOMY AND ADENOIDECTOMY  child  . TRANSTHORACIC ECHOCARDIOGRAM  01/18/2012   mild LVH,  ef 60%/  trivial TR  . TUBAL LIGATION  1985      Inpatient Medications: Scheduled Meds: . aspirin  81 mg Oral BID  . atenolol  50 mg Oral q morning - 10a  .  Eluxadoline  75 mg Oral BID  . enoxaparin (LOVENOX) injection  40 mg Subcutaneous Q24H  . furosemide  40 mg Oral Daily  . pantoprazole  40 mg Oral q morning - 10a   Continuous Infusions: . ceFEPime (MAXIPIME) IV    . vancomycin     PRN Meds: acetaminophen **OR** acetaminophen, fentaNYL (SUBLIMAZE) injection, ondansetron **OR** ondansetron (ZOFRAN) IV, promethazine  Allergies:   No Known Allergies  Social History:   Social History   Socioeconomic History  . Marital status: Widowed    Spouse name: Not on file  . Number of children: Not on file  . Years of education: Not on file    . Highest education level: Not on file  Occupational History  . 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 Financial resource strain: Not on file  . Food insecurity:    Worry: Not on file    Inability: Not on file  . Transportation needs:    Medical: Not on file    Non-medical: Not on file  Tobacco Use  . Smoking status: Never Smoker  . Smokeless tobacco: Never Used  Substance and Sexual Activity  . Alcohol use: Yes    Comment: Rare  . Drug use: No  . Sexual activity: Yes  Lifestyle  . Physical activity:    Days per week: Not on file    Minutes per session: Not on file  . Stress: Not on file  Relationships  . Social connections:    Talks on phone: Not on file    Gets together: Not on file    Attends religious service: Not on file    Active member of club or organization: Not on file    Attends meetings of clubs or organizations: Not on file    Relationship status: Not on file  . Intimate partner violence:    Fear of current or ex partner: Not on file    Emotionally abused: Not on file    Physically abused: Not on file    Forced sexual activity: Not on file  Other Topics Concern  . Not on file  Social History Narrative   Does not routinely exercise.  Has to walk up stairs to work and can do w/o Ss.  Lives @ home in [place] with family.    Family History:    Family History  Problem Relation Age of Onset  . Lymphoma Mother        non-hodgkins - died @ 3  . Coronary artery disease Mother   . Coronary artery disease Father        CABG in his 80s, died @ 73  . Diabetes Father   . Healthy Brother   . Hypertension Sister   . Arthritis Sister   . IgA nephropathy Daughter   . Celiac disease Daughter      ROS:  Please see the history of present illness.  All other ROS reviewed and negative.     Physical Exam/Data:   Vitals:   04/24/18 0358 04/24/18 0400 04/24/18 0500 04/24/18 0600  BP:  (!) 185/96 (!) 186/81 (!) 170/89   Pulse:  95 99 99  Resp:  19 20 19   Temp: 98.9 F (37.2 C)     TempSrc: Axillary     SpO2:  95% 96% 95%  Weight:      Height:       No intake or output data in the 24 hours ending 04/24/18 0736  Filed Weights   04/23/18 2046 04/24/18 0210  Weight: 217 lb (98.4 kg) 226 lb 3.1 oz (102.6 kg)   Body mass index is 38.83 kg/m.  General:  Well nourished, well developed, in no acute distress  Breathin is a little short HEENT: normal Lymph: no adenopathy Neck: Neck full Endocrine:  No thryomegaly Vascular: No carotid bruits; FA pulses 2+ bilaterally without bruits  Cardiac:  normal S1, S2; RRR; no murmur  Lungs: Decreased BS  R base    Abd: Sl distended   Very mild diffuse tenderness Ext: 2+ edema R leg  Incision dry   Tr LLE Musculoskeletal:  No deformities, BUE and BLE strength normal and equal Skin: warm and dry  Neuro:  CNs 2-12 intact, no focal abnormalities noted Psych:  Normal affect   EKG:  The EKG was personally reviewed and demonstrates:  ST 102 bpm  Nonspecific ST changes  Telemetry:  Telemetry was personally reviewed and demonstrates:  SR/ST  Relevant CV Studies:   Laboratory Data:  Chemistry Recent Labs  Lab 04/23/18 2111  NA 138  K 3.2*  CL 102  CO2 25  GLUCOSE 126*  BUN 8  CREATININE 0.70  CALCIUM 8.6*  GFRNONAA >60  GFRAA >60  ANIONGAP 11    Recent Labs  Lab 04/23/18 2111  PROT 6.7  ALBUMIN 2.9*  AST 47*  ALT 15  ALKPHOS 94  BILITOT 1.9*   Hematology Recent Labs  Lab 04/23/18 2111  WBC 6.7  RBC 4.33  HGB 13.0  HCT 39.6  MCV 91.5  MCH 30.0  MCHC 32.8  RDW 16.0*  PLT 227   Cardiac EnzymesNo results for input(s): TROPONINI in the last 168 hours. No results for input(s): TROPIPOC in the last 168 hours.  BNPNo results for input(s): BNP, PROBNP in the last 168 hours.  DDimer No results for input(s): DDIMER in the last 168 hours.  Radiology/Studies:  Dg Chest 2 View  Result Date: 04/23/2018 CLINICAL DATA:  62 year old female with  acute onset of cough and right-sided chest pain and shortness of breath. EXAM: CHEST - 2 VIEW COMPARISON:  Chest radiograph dated 01/20/2018 FINDINGS: There is a moderate size right-sided pleural effusion, new compared to the prior radiograph. There is associated compressive atelectasis of the majority of the right lower and right middle lobes. There is a small aerated portion of the lung in the right upper lobe. The left lung is clear. There is no pneumothorax. The right cardiac borders are silhouetted. No acute osseous pathology. IMPRESSION: Moderate right pleural effusion with compressive atelectasis of the majority of the right lung, new compared to the prior radiograph. Findings may be related to an infectious etiology although underlying mass is not entirely excluded. Clinical correlation and follow-up to resolution recommended. CT may provide additional evaluation if thoracentesis is considered for symptom relief. Electronically Signed   By: Anner Crete M.D.   On: 04/23/2018 21:36   Ct Angio Chest Pe W/cm &/or Wo Cm  Result Date: 04/23/2018 CLINICAL DATA:  62 y/o F; shortness of breath and abdominal pain with bloating. Knee surgery 1 month ago. EXAM: CT ANGIOGRAPHY CHEST CT ABDOMEN AND PELVIS WITH CONTRAST TECHNIQUE: Multidetector CT imaging of the chest was performed using the standard protocol during bolus administration of intravenous contrast. Multiplanar CT image reconstructions and MIPs were obtained to evaluate the vascular anatomy. Multidetector CT imaging of the abdomen and pelvis was performed using the standard protocol during bolus administration of intravenous contrast. CONTRAST:  38m ISOVUE-370 IOPAMIDOL (ISOVUE-370)  INJECTION 76% COMPARISON:  06/10/2011 CT abdomen and pelvis. FINDINGS: CTA CHEST FINDINGS Cardiovascular: Satisfactory opacification of pulmonary arteries. Respiratory motion artifact. No pulmonary embolus identified. Mild coronary artery calcification. Normal heart size.  No pericardial effusion. Normal caliber thoracic aorta. Mediastinum/Nodes: No enlarged mediastinal, hilar, or axillary lymph nodes. Thyroid gland, trachea, and esophagus demonstrate no significant findings. Lungs/Pleura: Large right pleural effusion with atelectasis of right lower lobe and partial atelectasis of the right upper lobes and middle lobe. Clear left lung. Musculoskeletal: No chest wall abnormality. No acute or significant osseous findings. Review of the MIP images confirms the above findings. CT ABDOMEN and PELVIS FINDINGS Hepatobiliary: Lobulation of liver contour with enlargement of the left lobe compatible with cirrhosis. Normal gallbladder. No biliary ductal dilatation. Pancreas: Unremarkable. No pancreatic ductal dilatation or surrounding inflammatory changes. Spleen: Normal in size without focal abnormality. Adrenals/Urinary Tract: Adrenal glands are unremarkable. Kidneys are normal, without renal calculi, focal lesion, or hydronephrosis. Bladder is unremarkable. Stomach/Bowel: Stomach is within normal limits. Appendix appears normal. No evidence of bowel wall thickening, distention, or inflammatory changes. Vascular/Lymphatic: No significant vascular findings are present. Enlargement of mesenteric lymph nodes measuring up to 18 x 14 mm (series 13, image 41). Reproductive: Uterus and bilateral adnexa are unremarkable. Other: Large volume of ascites. Musculoskeletal: No fracture is seen. Stable L1 mild anterior compression deformity. Review of the MIP images confirms the above findings. IMPRESSION: 1. Respiratory motion artifact.  No pulmonary embolus identified. 2. Large right pleural effusion with atelectasis of right lower lobe and partial atelectasis of right upper and middle lobes. 3. Lobulated liver with enlarged left lobe compatible with cirrhosis. 4. Large volume of ascites. 5. Mild enlargement of mesenteric lymph nodes may represent underlying inflammation or be due to venous congestion.  Electronically Signed   By: Kristine Garbe M.D.   On: 04/23/2018 22:12   Ct Abdomen Pelvis W Contrast  Result Date: 04/23/2018 CLINICAL DATA:  62 y/o F; shortness of breath and abdominal pain with bloating. Knee surgery 1 month ago. EXAM: CT ANGIOGRAPHY CHEST CT ABDOMEN AND PELVIS WITH CONTRAST TECHNIQUE: Multidetector CT imaging of the chest was performed using the standard protocol during bolus administration of intravenous contrast. Multiplanar CT image reconstructions and MIPs were obtained to evaluate the vascular anatomy. Multidetector CT imaging of the abdomen and pelvis was performed using the standard protocol during bolus administration of intravenous contrast. CONTRAST:  51m ISOVUE-370 IOPAMIDOL (ISOVUE-370) INJECTION 76% COMPARISON:  06/10/2011 CT abdomen and pelvis. FINDINGS: CTA CHEST FINDINGS Cardiovascular: Satisfactory opacification of pulmonary arteries. Respiratory motion artifact. No pulmonary embolus identified. Mild coronary artery calcification. Normal heart size. No pericardial effusion. Normal caliber thoracic aorta. Mediastinum/Nodes: No enlarged mediastinal, hilar, or axillary lymph nodes. Thyroid gland, trachea, and esophagus demonstrate no significant findings. Lungs/Pleura: Large right pleural effusion with atelectasis of right lower lobe and partial atelectasis of the right upper lobes and middle lobe. Clear left lung. Musculoskeletal: No chest wall abnormality. No acute or significant osseous findings. Review of the MIP images confirms the above findings. CT ABDOMEN and PELVIS FINDINGS Hepatobiliary: Lobulation of liver contour with enlargement of the left lobe compatible with cirrhosis. Normal gallbladder. No biliary ductal dilatation. Pancreas: Unremarkable. No pancreatic ductal dilatation or surrounding inflammatory changes. Spleen: Normal in size without focal abnormality. Adrenals/Urinary Tract: Adrenal glands are unremarkable. Kidneys are normal, without renal  calculi, focal lesion, or hydronephrosis. Bladder is unremarkable. Stomach/Bowel: Stomach is within normal limits. Appendix appears normal. No evidence of bowel wall thickening, distention, or inflammatory changes. Vascular/Lymphatic: No  significant vascular findings are present. Enlargement of mesenteric lymph nodes measuring up to 18 x 14 mm (series 13, image 41). Reproductive: Uterus and bilateral adnexa are unremarkable. Other: Large volume of ascites. Musculoskeletal: No fracture is seen. Stable L1 mild anterior compression deformity. Review of the MIP images confirms the above findings. IMPRESSION: 1. Respiratory motion artifact.  No pulmonary embolus identified. 2. Large right pleural effusion with atelectasis of right lower lobe and partial atelectasis of right upper and middle lobes. 3. Lobulated liver with enlarged left lobe compatible with cirrhosis. 4. Large volume of ascites. 5. Mild enlargement of mesenteric lymph nodes may represent underlying inflammation or be due to venous congestion. Electronically Signed   By: Kristine Garbe M.D.   On: 04/23/2018 22:12    Assessment and Plan:   Pt is a 62 yo who presented with respiratory distress  Found to have large R pleural effusion and ascites.  Doing some better after thoracentesis. CT with evid of cirrhosis On exam, pt hypertensive   Still with evid of volume increase on exam   I am not convinced of cardiac contribution to above but would recomm echo to eval LV and RVEF  Would initiate lasix and aldactone  Follow BP, urine output and Cr  GI to see pt     2.  HTN  As above  Should improve with diuresis  3   Coronary calcifications   No Rx for now  No evid of active ischemia  4  Hx SVT Continue b blocker    For questions or updates, please contact [place] Please consult [email or web address] for contact info under Cardiology/STEMI.   Signed, Dorris Carnes, MD  04/24/2018 7:36 AM

## 2018-04-24 NOTE — ED Provider Notes (Signed)
1:00 AM Asked to assess the patient by [facility].  They were concerned given her respiratory status.  On my evaluation, she is mildly tachycardic in the low 100s.  O2 sats are 93% with nasal cannula.  She is speaking in short sentences.  She states that she feels "about the same or may be a little bit worse."  I reviewed her chart.  She has a large pleural effusion.  She would like to avoid intubation if possible.  I have offered her BiPAP for work of breathing.  I do think she would benefit from a higher level of care in a stepdown bed in case she were to decompensate.  I discussed this with the admitting hospitalist who is concerned regarding the need for an emergent thoracentesis overnight.  I discussed with him that that is not a procedure that would at [facility].  I encouraged him to consult critical care or interventional radiology.  He is concerned that he will not be able to utilize these resources overnight.  I stressed with him that I have no specialty care at [facility] and it is most important to get her to a higher level of care.  Critical care medicine, Dr. Jimmy Footman was placed on the line.  She has agreed that if the patient needs critical care evaluation and possible thoracentesis overnight, they will evaluate.  Patient to be admitted to the stepdown unit under the hospitalist.   Merryl Hacker, MD 04/24/18 [phone]

## 2018-04-24 NOTE — H&P (Signed)
History and Physical    HAWLEY PAVIA TKZ:[id number] DOB: [birth year] DOA: 04/23/2018  PCP: Leamon Arnt, MD  Patient coming from: Home  I have personally briefly reviewed patient's old medical records in [place]  Chief Complaint: SOB  HPI: Dawn Moon is a 62 y.o. female with medical history significant of NASH diagnosed in 2000, HTN, recent knee replacement about a month ago.  Patient had been having slowly progressive and worsening abdominal distention over the course of the past month.  Also had been SOB since surgery, but thought it was just related to surgery.  Today developed worsening SOB over the course of the day prompting her to come in to [facility] ED for evaluation.  Symptoms are severe, constant, worsening, nothing makes better or worse.  Does have associated chest tightness.   ED Course: Given empiric cefepime and vanc doses for possible HCAP.  WBC nl, no fever.  CTA performed: no PE, does have large volume R pleural effusion with atelectatics of much of the R lung (most of the lung looks collapsed to me), cirrhotic appearance of the liver, large volume abdominal ascites.  Patient's breathing stabilized on rescue BIPAP, transferred to [facility] for further care.   Review of Systems: As per HPI otherwise 10 point review of systems negative.   Past Medical History:  Diagnosis Date  . GERD (gastroesophageal reflux disease)   . History of exercise stress test    03-07-2010  Stress echo--- no arrhythmias or conduction abnormalilites and negative for ischemia or chest pain  . History of kidney stones   . History of paroxysmal supraventricular tachycardia    episode 2011  consult w/ dr Caryl Comes --  put on atenolol--  per pt no longer an issue  . HTN (hypertension)    cardiologist --  dr Stanford Breed  . IBS (irritable bowel syndrome)    diarrhea  . Nonalcoholic steatohepatitis (NASH)   . Numbness and tingling of left lower extremity    post achilles tendon repair  .  OA (osteoarthritis)    knees  . PONV (postoperative nausea and vomiting)   . Vitamin D deficiency   . Wears glasses     Past Surgical History:  Procedure Laterality Date  . ACHILLES TENDON SURGERY Left 06/2016  . BREAST BIOPSY Left 02/2016   benign  . COLONOSCOPY  2005  . CT CTA CORONARY W/CA SCORE W/CM &/OR WO/CM  01/01/2014   non-obstructive calcified plaque in pLAD (0-25%), no significant incidental noncardiac findings noted  . ESOPHAGOGASTRODUODENOSCOPY  01/2014  . EXTRACORPOREAL SHOCK WAVE LITHOTRIPSY  2010  . KNEE ARTHROPLASTY Right 03/24/2018   Procedure: RIGHT TOTAL KNEE ARTHROPLASTY WITH COMPUTER NAVIGATION;  Surgeon: Rod Can, MD;  Location: [facility];  Service: Orthopedics;  Laterality: Right;  Needs RNFA  . KNEE ARTHROSCOPY Right 12/04/2016   Procedure: ARTHROSCOPY KNEE WITH PARTIAL MEDIAL MENISCECTOMY;  Surgeon: Rod Can, MD;  Location: [place];  Service: Orthopedics;  Laterality: Right;  . LEFT HEART CATHETERIZATION WITH CORONARY ANGIOGRAM N/A 01/11/2012   Procedure: LEFT HEART CATHETERIZATION WITH CORONARY ANGIOGRAM;  Surgeon: Sherren Mocha, MD;  Location: [facility] C[facility];  Service: Cardiovascular;  Laterality: N/A;  widely patent coronary arterires without significant obstructive CAD,  normal LVF, ef 55-56%  . TONSILLECTOMY AND ADENOIDECTOMY  child  . TRANSTHORACIC ECHOCARDIOGRAM  01/18/2012   mild LVH,  ef 60%/  trivial TR  . TUBAL LIGATION  1985     reports that she has never smoked. She has never  used smokeless tobacco. She reports that she drinks alcohol. She reports that she does not use drugs.  No Known Allergies  Family History  Problem Relation Age of Onset  . Lymphoma Mother        non-hodgkins - died @ 75  . Coronary artery disease Mother   . Coronary artery disease Father        CABG in his 58s, died @ 106  . Diabetes Father   . Healthy Brother   . Hypertension Sister   . Arthritis Sister   . IgA nephropathy Daughter   .  Celiac disease Daughter      Prior to Admission medications   Medication Sig Start Date End Date Taking? Authorizing Provider  aspirin 81 MG chewable tablet Chew 1 tablet (81 mg total) by mouth 2 (two) times daily. 03/25/18   Swinteck, Aaron Edelman, MD  atenolol (TENORMIN) 50 MG tablet Take 1 tablet (50 mg total) by mouth every morning. Patient taking differently: Take 50 mg by mouth every morning.  12/10/17   Hawks, Christy A, FNP  Butalbital-APAP-Caffeine 50-300-40 MG CAPS Take 1 tablet by mouth every 8 (eight) hours as needed. Patient taking differently: Take 1 tablet by mouth every 8 (eight) hours as needed (for pain.).  01/10/18   Evelina Dun A, FNP  Eluxadoline (VIBERZI) 75 MG TABS Take 75 mg by mouth 2 (two) times daily. Patient taking differently: Take 75 mg by mouth 2 (two) times daily.  12/10/17   Sharion Balloon, FNP  furosemide (LASIX) 20 MG tablet Take 1 tablet (20 mg total) by mouth every morning. Patient taking up to TID prn Patient taking differently: Take 40 mg by mouth daily. Patient taking up to TID prn 04/22/18   Leamon Arnt, MD  pantoprazole (PROTONIX) 40 MG tablet Take 1 tablet (40 mg total) by mouth every morning. 12/10/17   Evelina Dun A, FNP  potassium citrate (UROCIT-K) 10 MEQ (1080 MG) SR tablet Take 1 tablet (10 mEq total) by mouth See admin instructions. Take 1 tablet (20 mg) by mouth scheduled in the morning, & may repeat 1 tablet (20 mg) up to twice daily if needed for fluid retention. 04/22/18   Leamon Arnt, MD  promethazine (PHENERGAN) 12.5 MG tablet Take 12.5 mg by mouth every 6 (six) hours as needed for nausea or vomiting (per pt takes w/ tramadol to prevent nausea).     [provider]    Physical Exam: Vitals:   04/24/18 0000 04/24/18 0030 04/24/18 0200 04/24/18 0214  BP: (!) 162/82 (!) 158/81    Pulse: (!) 101 (!) 105    Resp: (!) 28 (!) 28    Temp:    99 F (37.2 C)  TempSrc:    Axillary  SpO2: 95% 94% 98%   Weight:      Height:         Constitutional: NAD, calm, comfortable Eyes: PERRL, lids and conjunctivae normal ENMT: Mucous membranes are moist. Posterior pharynx clear of any exudate or lesions.Normal dentition.  Neck: normal, supple, no masses, no thyromegaly Respiratory: Absent / diminished over most of the R lung fields. Cardiovascular: Regular rate and rhythm, no murmurs / rubs / gallops. No extremity edema. 2+ pedal pulses. No carotid bruits.  Abdomen: no tenderness, no masses palpated. No hepatosplenomegaly. Bowel sounds positive.  Musculoskeletal: no clubbing / cyanosis. No joint deformity upper and lower extremities. Good ROM, no contractures. Normal muscle tone.  Skin: no rashes, lesions, ulcers. No induration Neurologic: CN 2-12 grossly  intact. Sensation intact, DTR normal. Strength 5/5 in all 4.  Psychiatric: Normal judgment and insight. Alert and oriented x 3. Normal mood.    Labs on Admission: I have personally reviewed following labs and imaging studies  CBC: Recent Labs  Lab 04/23/18 2111  WBC 6.7  NEUTROABS 3.7  HGB 13.0  HCT 39.6  MCV 91.5  PLT 412   Basic Metabolic Panel: Recent Labs  Lab 04/23/18 2111  NA 138  K 3.2*  CL 102  CO2 25  GLUCOSE 126*  BUN 8  CREATININE 0.70  CALCIUM 8.6*   GFR: Estimated Creatinine Clearance: 84.2 mL/min (by C-G formula based on SCr of 0.7 mg/dL). Liver Function Tests: Recent Labs  Lab 04/23/18 2111  AST 47*  ALT 15  ALKPHOS 94  BILITOT 1.9*  PROT 6.7  ALBUMIN 2.9*   No results for input(s): LIPASE, AMYLASE in the last 168 hours. No results for input(s): AMMONIA in the last 168 hours. Coagulation Profile: No results for input(s): INR, PROTIME in the last 168 hours. Cardiac Enzymes: No results for input(s): CKTOTAL, CKMB, CKMBINDEX, TROPONINI in the last 168 hours. BNP (last 3 results) No results for input(s): PROBNP in the last 8760 hours. HbA1C: No results for input(s): HGBA1C in the last 72 hours. CBG: No results for input(s):  GLUCAP in the last 168 hours. Lipid Profile: No results for input(s): CHOL, HDL, LDLCALC, TRIG, CHOLHDL, LDLDIRECT in the last 72 hours. Thyroid Function Tests: No results for input(s): TSH, T4TOTAL, FREET4, T3FREE, THYROIDAB in the last 72 hours. Anemia Panel: No results for input(s): VITAMINB12, FOLATE, FERRITIN, TIBC, IRON, RETICCTPCT in the last 72 hours. Urine analysis:    Component Value Date/Time   COLORURINE YELLOW 01/10/2012 1844   APPEARANCEUR CLEAR 01/10/2012 1844   LABSPEC 1.010 01/10/2012 1844   PHURINE 7.0 01/10/2012 1844   GLUCOSEU NEGATIVE 01/10/2012 1844   HGBUR NEGATIVE 01/10/2012 1844   BILIRUBINUR NEGATIVE 01/10/2012 1844   KETONESUR NEGATIVE 01/10/2012 1844   PROTEINUR NEGATIVE 01/10/2012 1844   UROBILINOGEN 0.2 01/10/2012 1844   NITRITE NEGATIVE 01/10/2012 1844   LEUKOCYTESUR NEGATIVE 01/10/2012 1844    Radiological Exams on Admission: Dg Chest 2 View  Result Date: 04/23/2018 CLINICAL DATA:  62 year old female with acute onset of cough and right-sided chest pain and shortness of breath. EXAM: CHEST - 2 VIEW COMPARISON:  Chest radiograph dated 01/20/2018 FINDINGS: There is a moderate size right-sided pleural effusion, new compared to the prior radiograph. There is associated compressive atelectasis of the majority of the right lower and right middle lobes. There is a small aerated portion of the lung in the right upper lobe. The left lung is clear. There is no pneumothorax. The right cardiac borders are silhouetted. No acute osseous pathology. IMPRESSION: Moderate right pleural effusion with compressive atelectasis of the majority of the right lung, new compared to the prior radiograph. Findings may be related to an infectious etiology although underlying mass is not entirely excluded. Clinical correlation and follow-up to resolution recommended. CT may provide additional evaluation if thoracentesis is considered for symptom relief. Electronically Signed   By: Anner Crete M.D.   On: 04/23/2018 21:36   Ct Angio Chest Pe W/cm &/or Wo Cm  Result Date: 04/23/2018 CLINICAL DATA:  62 y/o F; shortness of breath and abdominal pain with bloating. Knee surgery 1 month ago. EXAM: CT ANGIOGRAPHY CHEST CT ABDOMEN AND PELVIS WITH CONTRAST TECHNIQUE: Multidetector CT imaging of the chest was performed using the standard protocol during bolus administration of  intravenous contrast. Multiplanar CT image reconstructions and MIPs were obtained to evaluate the vascular anatomy. Multidetector CT imaging of the abdomen and pelvis was performed using the standard protocol during bolus administration of intravenous contrast. CONTRAST:  53m ISOVUE-370 IOPAMIDOL (ISOVUE-370) INJECTION 76% COMPARISON:  06/10/2011 CT abdomen and pelvis. FINDINGS: CTA CHEST FINDINGS Cardiovascular: Satisfactory opacification of pulmonary arteries. Respiratory motion artifact. No pulmonary embolus identified. Mild coronary artery calcification. Normal heart size. No pericardial effusion. Normal caliber thoracic aorta. Mediastinum/Nodes: No enlarged mediastinal, hilar, or axillary lymph nodes. Thyroid gland, trachea, and esophagus demonstrate no significant findings. Lungs/Pleura: Large right pleural effusion with atelectasis of right lower lobe and partial atelectasis of the right upper lobes and middle lobe. Clear left lung. Musculoskeletal: No chest wall abnormality. No acute or significant osseous findings. Review of the MIP images confirms the above findings. CT ABDOMEN and PELVIS FINDINGS Hepatobiliary: Lobulation of liver contour with enlargement of the left lobe compatible with cirrhosis. Normal gallbladder. No biliary ductal dilatation. Pancreas: Unremarkable. No pancreatic ductal dilatation or surrounding inflammatory changes. Spleen: Normal in size without focal abnormality. Adrenals/Urinary Tract: Adrenal glands are unremarkable. Kidneys are normal, without renal calculi, focal lesion, or hydronephrosis.  Bladder is unremarkable. Stomach/Bowel: Stomach is within normal limits. Appendix appears normal. No evidence of bowel wall thickening, distention, or inflammatory changes. Vascular/Lymphatic: No significant vascular findings are present. Enlargement of mesenteric lymph nodes measuring up to 18 x 14 mm (series 13, image 41). Reproductive: Uterus and bilateral adnexa are unremarkable. Other: Large volume of ascites. Musculoskeletal: No fracture is seen. Stable L1 mild anterior compression deformity. Review of the MIP images confirms the above findings. IMPRESSION: 1. Respiratory motion artifact.  No pulmonary embolus identified. 2. Large right pleural effusion with atelectasis of right lower lobe and partial atelectasis of right upper and middle lobes. 3. Lobulated liver with enlarged left lobe compatible with cirrhosis. 4. Large volume of ascites. 5. Mild enlargement of mesenteric lymph nodes may represent underlying inflammation or be due to venous congestion. Electronically Signed   By: LKristine GarbeM.D.   On: 04/23/2018 22:12   Ct Abdomen Pelvis W Contrast  Result Date: 04/23/2018 CLINICAL DATA:  62y/o F; shortness of breath and abdominal pain with bloating. Knee surgery 1 month ago. EXAM: CT ANGIOGRAPHY CHEST CT ABDOMEN AND PELVIS WITH CONTRAST TECHNIQUE: Multidetector CT imaging of the chest was performed using the standard protocol during bolus administration of intravenous contrast. Multiplanar CT image reconstructions and MIPs were obtained to evaluate the vascular anatomy. Multidetector CT imaging of the abdomen and pelvis was performed using the standard protocol during bolus administration of intravenous contrast. CONTRAST:  660mISOVUE-370 IOPAMIDOL (ISOVUE-370) INJECTION 76% COMPARISON:  06/10/2011 CT abdomen and pelvis. FINDINGS: CTA CHEST FINDINGS Cardiovascular: Satisfactory opacification of pulmonary arteries. Respiratory motion artifact. No pulmonary embolus identified. Mild  coronary artery calcification. Normal heart size. No pericardial effusion. Normal caliber thoracic aorta. Mediastinum/Nodes: No enlarged mediastinal, hilar, or axillary lymph nodes. Thyroid gland, trachea, and esophagus demonstrate no significant findings. Lungs/Pleura: Large right pleural effusion with atelectasis of right lower lobe and partial atelectasis of the right upper lobes and middle lobe. Clear left lung. Musculoskeletal: No chest wall abnormality. No acute or significant osseous findings. Review of the MIP images confirms the above findings. CT ABDOMEN and PELVIS FINDINGS Hepatobiliary: Lobulation of liver contour with enlargement of the left lobe compatible with cirrhosis. Normal gallbladder. No biliary ductal dilatation. Pancreas: Unremarkable. No pancreatic ductal dilatation or surrounding inflammatory changes. Spleen: Normal in size without focal abnormality.  Adrenals/Urinary Tract: Adrenal glands are unremarkable. Kidneys are normal, without renal calculi, focal lesion, or hydronephrosis. Bladder is unremarkable. Stomach/Bowel: Stomach is within normal limits. Appendix appears normal. No evidence of bowel wall thickening, distention, or inflammatory changes. Vascular/Lymphatic: No significant vascular findings are present. Enlargement of mesenteric lymph nodes measuring up to 18 x 14 mm (series 13, image 41). Reproductive: Uterus and bilateral adnexa are unremarkable. Other: Large volume of ascites. Musculoskeletal: No fracture is seen. Stable L1 mild anterior compression deformity. Review of the MIP images confirms the above findings. IMPRESSION: 1. Respiratory motion artifact.  No pulmonary embolus identified. 2. Large right pleural effusion with atelectasis of right lower lobe and partial atelectasis of right upper and middle lobes. 3. Lobulated liver with enlarged left lobe compatible with cirrhosis. 4. Large volume of ascites. 5. Mild enlargement of mesenteric lymph nodes may represent  underlying inflammation or be due to venous congestion. Electronically Signed   By: Kristine Garbe M.D.   On: 04/23/2018 22:12    EKG: Independently reviewed.  Assessment/Plan Principal Problem:   Acute respiratory failure (HCC) Active Problems:   Essential hypertension   Cirrhosis of liver with ascites (HCC)   Pleural effusion, right    1. Acute respiratory failure - due to large R pleural effusion collapsing most of the R lung as seen on CT 1. Stabilized on rescue BIPAP 2. IR to drain in AM hopefully 3. Labs ordered on fluid 4. But I strongly suspect that this is hepatic hydrothorax in this patient with h/o nash who also has large volume abd ascites and cirrhotic appearing liver on CT scan, HPI c/w progressive ascites over past few weeks. 2. Cirrhosis of liver with ascites - 1. Call GI in AM 2. Continue home lasix for now 3. HTN - continue home BP meds  DVT prophylaxis: Lovenox Code Status: Full Family Communication: Family at bedside Disposition Plan: Home after admit Consults called: None called, call GI in AM. Admission status: Admit to inpatient - IP status for rescue BIPAP and respiratory failure.   Etta Quill DO [facility] Pager [phone]  If 7AM-7PM, please contact day team taking care of patient [email or web address] Password [email or web address]  04/24/2018, 2:48 AM

## 2018-04-24 NOTE — Consult Note (Addendum)
Referring Provider: Dr. Wynelle Cleveland, [facility] Primary Care Physician:  Leamon Arnt, MD Primary Gastroenterologist:  [facility]  Reason for Consultation:  NASH cirrhosis  IMPRESSION:  *NASH cirrhosis:  Has ascites and right pleural effusion (? Hepatic hydrothorax).  S/p thoracentesis.  Studies pending.  On lasix 40 mg BID and spironolactone 50 mg daily.  ? If acute decompensation due to her recent surgery (TKA).   PLAN: *Will check Hep A and B status. *Adjust of diuretics per Dr. Carlean Purl. *Check PT/INR. *2 gram sodium diet and monitor weights. *Will request GI records from [facility] when we see her as outpatient.   Laban Emperor. Zehr  04/24/2018, 10:08 AM     [facility] Attending   I have taken an interval history, reviewed the chart and examined the patient. I agree with the Advanced Practitioner's note, impression and recommendations.   NASH Cirrhosis decompensated w/ Ascites, pleural effusion related to # 1  - probably triggered by knee surgery - probably Child's B - do not have INR back yet but she is at 8 pts w/o  IBS-D on Viberzi - ok to use in [place] B but contraindicated Child's C   1) Change diuretics to furosemide 40 mg IV bid (when goes home to qd dosing) and spironolactone 100 mg qam (need to be aggressive and titrate upwards as needed) - if she fails maximal diuretic regimen then a TIPS would be next step but we have a lot of Tx we can do w/ meds first  2) I ordered a paracentesis for tomorrow and will do albumin infusion post (ordered)  3) Eventual outpt f/u me and EGD to screen for varices  4) needs an echocardiogram - I think Dr. Harrington Challenger plans to order one  5) I do not see a clear need to continue Abx much longer as expect atelectasis over PNA  6) Await R LE doppler though expect R leg edema from TKR and fluid overload but have to check for DVT as you are  7) FHx celiac dz noted - if she has not been checked for celiac dz as part of her w/u (will need to ask - see  records) would do so but that can wait - I did not speak to her about that today   I called her daughter Chanetta Marshall, NP also   Gatha Mayer, MD, [facility] 04/24/2018 12:49 PM            HPI: DE LIBMAN is a 62 y.o. female with medical history significant of NASH diagnosed in 2000 by iver bx, HTN, recent R knee replacement about a month ago.  Patient had been having slowly progressive and worsening abdominal distention and LE edema over the course of the past month.  Also had been SOB since surgery, but thought it was just related to surgery and deconditioning.  On 5/4 she developed worsening SOB over the course of the day prompting her to go to [facility] ED for evaluation.  ED Course: Given empiric cefepime and vanc doses for possible HCAP.  WBC nl, no fever.  CTA performed: no PE, does have large volume R pleural effusion with atelectatics of much of the R lung (most of the lung looks collapsed), cirrhotic appearance of the liver, large volume abdominal ascites.  Underwent thoracentesis and SOB is much better.  As outpatient she was on lasix 20 mg daily and potassium supplement.  Here she is on lasix 40 mg BID and spironolactone 50 mg daily.  Has not seen GI in over a year.  Last EGD and colonoscopy were 01/2014.  Reports they were normal.  Takes Viberzi 75 mg BID for IBS-D and has been on that for 1.5 years with good results.   Past Medical History:  Diagnosis Date  . GERD (gastroesophageal reflux disease)   . History of exercise stress test    03-07-2010  Stress echo--- no arrhythmias or conduction abnormalilites and negative for ischemia or chest pain  . History of kidney stones   . History of paroxysmal supraventricular tachycardia    episode 2011  consult w/ dr Caryl Comes --  put on atenolol--  per pt no longer an issue  . HTN (hypertension)    cardiologist --  dr Stanford Breed  . IBS (irritable bowel syndrome)    diarrhea  . Nonalcoholic  steatohepatitis (NASH)   . Numbness and tingling of left lower extremity    post achilles tendon repair  . OA (osteoarthritis)    knees  . PONV (postoperative nausea and vomiting)   . Vitamin D deficiency   . Wears glasses     Past Surgical History:  Procedure Laterality Date  . ACHILLES TENDON SURGERY Left 06/2016  . BREAST BIOPSY Left 02/2016   benign  . COLONOSCOPY  2005  . CT CTA CORONARY W/CA SCORE W/CM &/OR WO/CM  01/01/2014   non-obstructive calcified plaque in pLAD (0-25%), no significant incidental noncardiac findings noted  . ESOPHAGOGASTRODUODENOSCOPY  01/2014  . EXTRACORPOREAL SHOCK WAVE LITHOTRIPSY  2010  . KNEE ARTHROPLASTY Right 03/24/2018   Procedure: RIGHT TOTAL KNEE ARTHROPLASTY WITH COMPUTER NAVIGATION;  Surgeon: Rod Can, MD;  Location: [facility];  Service: Orthopedics;  Laterality: Right;  Needs RNFA  . KNEE ARTHROSCOPY Right 12/04/2016   Procedure: ARTHROSCOPY KNEE WITH PARTIAL MEDIAL MENISCECTOMY;  Surgeon: Rod Can, MD;  Location: [place];  Service: Orthopedics;  Laterality: Right;  . LEFT HEART CATHETERIZATION WITH CORONARY ANGIOGRAM N/A 01/11/2012   Procedure: LEFT HEART CATHETERIZATION WITH CORONARY ANGIOGRAM;  Surgeon: Sherren Mocha, MD;  Location: [facility] CATH LAB;  Service: Cardiovascular;  Laterality: N/A;  widely patent coronary arterires without significant obstructive CAD,  normal LVF, ef 55-56%  . TONSILLECTOMY AND ADENOIDECTOMY  child  . TRANSTHORACIC ECHOCARDIOGRAM  01/18/2012   mild LVH,  ef 60%/  trivial TR  . TUBAL LIGATION  1985    Prior to Admission medications   Medication Sig Start Date End Date Taking? Authorizing Provider  aspirin 81 MG chewable tablet Chew 1 tablet (81 mg total) by mouth 2 (two) times daily. 03/25/18  Yes Swinteck, Aaron Edelman, MD  atenolol (TENORMIN) 50 MG tablet Take 1 tablet (50 mg total) by mouth every morning. Patient taking differently: Take 50 mg by mouth every morning.  12/10/17  Yes Hawks,  Christy A, FNP  Butalbital-APAP-Caffeine 50-300-40 MG CAPS Take 1 tablet by mouth every 8 (eight) hours as needed. Patient taking differently: Take 1 tablet by mouth every 8 (eight) hours as needed (for pain.).  01/10/18  Yes Hawks, Christy A, FNP  Eluxadoline (VIBERZI) 75 MG TABS Take 75 mg by mouth 2 (two) times daily. Patient taking differently: Take 75 mg by mouth 2 (two) times daily.  12/10/17  Yes Hawks, Christy A, FNP  furosemide (LASIX) 20 MG tablet Take 1 tablet (20 mg total) by mouth every morning. Patient taking up to TID prn Patient taking differently: Take 40 mg by mouth daily. Patient taking up to TID prn 04/22/18  Yes Leamon Arnt, MD  potassium  citrate (UROCIT-K) 10 MEQ (1080 MG) SR tablet Take 1 tablet (10 mEq total) by mouth See admin instructions. Take 1 tablet (20 mg) by mouth scheduled in the morning, & may repeat 1 tablet (20 mg) up to twice daily if needed for fluid retention. Patient taking differently: Take 10 mEq by mouth See admin instructions. Take 1 tablet (10 meq) by mouth scheduled in the morning, & may repeat 1 tablet (10 meq mg) up to twice daily if needed for fluid retention. 04/22/18  Yes Leamon Arnt, MD  pantoprazole (PROTONIX) 40 MG tablet Take 1 tablet (40 mg total) by mouth every morning. Patient not taking: Reported on 04/24/2018 12/10/17   Sharion Balloon, FNP    Current Facility-Administered Medications  Medication Dose Route Frequency Provider Last Rate Last Dose  . acetaminophen (TYLENOL) tablet 650 mg  650 mg Oral Q6H PRN Etta Quill, DO       Or  . acetaminophen (TYLENOL) suppository 650 mg  650 mg Rectal Q6H PRN Etta Quill, DO      . aspirin chewable tablet 81 mg  81 mg Oral BID Etta Quill, DO   81 mg at 04/24/18 0916  . atenolol (TENORMIN) tablet 50 mg  50 mg Oral q morning - 10a Etta Quill, DO   50 mg at 04/24/18 5465  . ceFEPIme (MAXIPIME) 2 g in sodium chloride 0.9 % 100 mL IVPB  2 g Intravenous Q8H Dorrene German, [place]    Stopped at 04/24/18 6812  . Eluxadoline TABS 75 mg  75 mg Oral BID Etta Quill, DO      . enoxaparin (LOVENOX) injection 40 mg  40 mg Subcutaneous Q24H Jennette Kettle M, DO   40 mg at 04/24/18 7517  . fentaNYL (SUBLIMAZE) injection 25-50 mcg  25-50 mcg Intravenous Q2H PRN Etta Quill, DO   50 mcg at 04/24/18 0816  . furosemide (LASIX) tablet 40 mg  40 mg Oral BID Debbe Odea, MD   40 mg at 04/24/18 0017  . lidocaine (XYLOCAINE) 1 % (with pres) injection           . ondansetron (ZOFRAN) tablet 4 mg  4 mg Oral Q6H PRN Etta Quill, DO       Or  . ondansetron [facility]) injection 4 mg  4 mg Intravenous Q6H PRN Etta Quill, DO      . pantoprazole (PROTONIX) EC tablet 40 mg  40 mg Oral q morning - 10a Etta Quill, DO   40 mg at 04/24/18 4944  . promethazine (PHENERGAN) tablet 12.5 mg  12.5 mg Oral Q6H PRN Etta Quill, DO   12.5 mg at 04/24/18 0323  . spironolactone (ALDACTONE) tablet 50 mg  50 mg Oral Daily Debbe Odea, MD   50 mg at 04/24/18 0916  . vancomycin (VANCOCIN) IVPB 1000 mg/200 mL premix  1,000 mg Intravenous Q24H Dorrene German, [facility]        Allergies as of 04/23/2018  . (No Known Allergies)    Family History  Problem Relation Age of Onset  . Lymphoma Mother        non-hodgkins - died @ 46  . Coronary artery disease Mother   . Coronary artery disease Father        CABG in his 71s, died @ 82  . Diabetes Father   . Healthy Brother   . Hypertension Sister   . Arthritis Sister   . IgA nephropathy Daughter   .  Celiac disease Daughter       Review of Systems: Recuperating from Right TKR ROS is O/W negative except as mentioned in HPI.  Physical Exam: Vital signs in last 24 hours: Temp:  [98.3 F (36.8 C)-99 F (37.2 C)] 98.9 F (37.2 C) (05/05 0358) Pulse Rate:  [95-115] 99 (05/05 0600) Resp:  [18-34] 19 (05/05 0600) BP: (143-186)/(70-98) 170/89 (05/05 0600) SpO2:  [89 %-98 %] 95 % (05/05 0600) FiO2 (%):  [40 %] 40 % (05/05  0347) Weight:  [217 lb (98.4 kg)-226 lb 3.1 oz (102.6 kg)] 226 lb 3.1 oz (102.6 kg) (05/05 0210)   General:  Alert, Well-developed, well-nourished, pleasant and cooperative in NAD Head:  Normocephalic and atraumatic. Eyes:  Sclera clear, no icterus.  Conjunctiva pink. Ears:  Normal auditory acuity. Mouth:  No deformity or lesions.   Lungs:  Decreased breat sounds 1/3 up on R clear on left  Heart:  Regular rate and rhythm; no murmurs, clicks, rubs, or gallops. CEG - ? gallop Abdomen:  Soft, moderately distended + ascites.  BS present.  Non-tender. No detectable HSM./mass Msk:  Symmetrical without gross deformities except R TKR changes Extremities:  Trace edema in B/L LE's., R knee is swollen s/p TKR - overall R> L edema Neurologic:  Alert and oriented x 4;  grossly normal neurologically. Skin:  Intact without significant lesions or rashes. Do not see spider angiomata or other stigmata chronic liver dz Psych:  Alert and cooperative. Normal mood and affect.  No asterixis   Lab Results: Recent Labs    04/23/18 2111  WBC 6.7  HGB 13.0  HCT 39.6  PLT 227   BMET Recent Labs    04/23/18 2111  NA 138  K 3.2*  CL 102  CO2 25  GLUCOSE 126*  BUN 8  CREATININE 0.70  CALCIUM 8.6*   LFT Recent Labs    04/23/18 2111  PROT 6.7  ALBUMIN 2.9*  AST 47*  ALT 15  ALKPHOS 94  BILITOT 1.9*    HCV Ab neg 2018  Studies/Results: Dg Chest 1 View  Result Date: 04/24/2018 CLINICAL DATA:  Status post right thoracentesis. EXAM: CHEST  1 VIEW COMPARISON:  04/23/2018 FINDINGS: Significant reduction in right pleural fluid volume with underlying airspace disease of the right lower lung representing either pneumonia or residual atelectasis. No pneumothorax. No pulmonary edema. Stable mild cardiac enlargement. IMPRESSION: No pneumothorax with significant reduction in right pleural fluid volume after thoracentesis. Underlying pneumonia versus atelectasis of the right lower lung. Electronically  Signed   By: Aletta Edouard M.D.   On: 04/24/2018 09:13   Dg Chest 2 View  Result Date: 04/23/2018 CLINICAL DATA:  62 year old female with acute onset of cough and right-sided chest pain and shortness of breath. EXAM: CHEST - 2 VIEW COMPARISON:  Chest radiograph dated 01/20/2018 FINDINGS: There is a moderate size right-sided pleural effusion, new compared to the prior radiograph. There is associated compressive atelectasis of the majority of the right lower and right middle lobes. There is a small aerated portion of the lung in the right upper lobe. The left lung is clear. There is no pneumothorax. The right cardiac borders are silhouetted. No acute osseous pathology. IMPRESSION: Moderate right pleural effusion with compressive atelectasis of the majority of the right lung, new compared to the prior radiograph. Findings may be related to an infectious etiology although underlying mass is not entirely excluded. Clinical correlation and follow-up to resolution recommended. CT may provide additional evaluation if thoracentesis is considered for  symptom relief. Electronically Signed   By: Anner Crete M.D.   On: 04/23/2018 21:36   Ct Angio Chest Pe W/cm &/or Wo Cm Ct Abdomen Pelvis W Contrast  Images viewed by Gatha Mayer, MD, [facility]   Result Date: 04/23/2018 CLINICAL DATA:  61 y/o F; shortness of breath and abdominal pain with bloating. Knee surgery 1 month ago. EXAM: CT ANGIOGRAPHY CHEST CT ABDOMEN AND PELVIS WITH CONTRAST TECHNIQUE: .   IMPRESSION: 1. Respiratory motion artifact.  No pulmonary embolus identified. 2. Large right pleural effusion with atelectasis of right lower lobe and partial atelectasis of right upper and middle lobes. 3. Lobulated liver with enlarged left lobe compatible with cirrhosis. 4. Large volume of ascites. 5. Mild enlargement of mesenteric lymph nodes may represent underlying inflammation or be due to venous congestion. Electronically Signed   By: Kristine Garbe  M.D.   On: 04/23/2018 22:12

## 2018-04-24 NOTE — Plan of Care (Signed)
  Problem: Pain Managment: Goal: General experience of comfort will improve Outcome: Progressing   

## 2018-04-24 NOTE — ED Notes (Signed)
Pt on bipap, states less WOB, able to speak in clearer sentences.

## 2018-04-24 NOTE — Progress Notes (Addendum)
PROGRESS NOTE    Dawn Moon   KXF:[id number]  DOB: [birth year]  DOA: 04/23/2018 PCP: Leamon Arnt, MD   Brief Narrative:  Dawn Moon is a 62 year old female with IBS, paroxysmal SVT, hypertension, [name] who underwent a right knee replacement on 4/4 and was discharged from the hospital on 4/5. She presents for shortness of breath steadily worsening since her surgery.  She had doubled her Lasix to 40 mg daily because her right leg has been swollen as well.  She became suddenly more short of breath and presented to the ER.  CTA was done to rule out PE and showed large right-sided pleural effusion with with atelectasis of the right lower lobe and partial atelectasis of the right mid and upper lobes, lobulated liver with enlarged left lobe  compatible with cirrhosis, large volume ascites and mild enlargement of mesenteric lymph nodes. She was admitted to the stepdown unit and an IR consult for thoracentesis was placed.   Subjective: Evaluated after thoracentesis.  She has a dry cough.  At rest she is not short of breath.  She is concerned about her right leg swelling and states that it occurred after her knee surgery and was extending up to her thigh. However, thigh swelling has improved but lower leg remains quite swollen. ROS: no complaints of nausea, vomiting, constipation diarrhea, cough, dyspnea or dysuria. No other complaints.   Assessment & Plan:   Principal Problem:   Acute hypoxic respiratory failure  -Secondary to right pleural effusion She states she is feeling much better after the thoracentesis 2 L of fluid - initial results reveal turbid fluid with mesothelial cells, 724 WBCs 40% lymphocytes and 56% monocytes  Active Problems: Ascites/right pleural effusion -Most likely secondary to cirrhosis and hepatic hydrothorax -have started Lasix 40 twice daily (she was taking 20 once a day and had increased it to 40 once a day) and Aldactone 50 mg daily and placed her on a  low-sodium diet -Follow I&O's and daily weights  Cirrhosis of the liver -She has had nonalcoholic steatohepatitis -Hep C antibody has been checked in the past and is noted to be negative in our computer system. -She is followed w by [facility] but the patient would like another GI opinion-  have asked for [facility] to evaluate- Dr. Carlean Purl and his PA will see the patient  Right leg edema -Occurring in relation to right knee surgery-we will obtain venous duplex    Essential hypertension -Continue atenolol- Lasix and Aldactone added    NSVT (nonsustained ventricular tachycardia) -Was started on atenolol by Dr. 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 Disposition Plan: Continue to follow in stepdown unit Consultants:   GI Procedures:   Right thoracentesis Antimicrobials:  Anti-infectives (From admission, onward)   Start     Dose/Rate Route Frequency Ordered Stop   04/24/18 2200  vancomycin (VANCOCIN) IVPB 1000 mg/200 mL premix     1,000 mg 200 mL/hr over 60 Minutes Intravenous Every 24 hours 04/24/18 0256     04/24/18 1100  vancomycin (VANCOCIN) 1,250 mg in sodium chloride 0.9 % 250 mL IVPB  Status:  Discontinued     1,250 mg 166.7 mL/hr over 90 Minutes Intravenous Every 12 hours 04/23/18 2242 04/24/18 0215   04/24/18 0800  ceFEPIme (MAXIPIME) 2 g in sodium chloride 0.9 %  100 mL IVPB  Status:  Discontinued     2 g 200 mL/hr over 30 Minutes Intravenous Every 8 hours 04/23/18 2242 04/24/18 0215   04/24/18 0800  ceFEPIme (MAXIPIME) 2 g in sodium chloride 0.9 % 100 mL IVPB     2 g 200 mL/hr over 30 Minutes Intravenous Every 8 hours 04/24/18 0256     04/23/18 2340  vancomycin (VANCOCIN) 1000 MG powder  Status:  Discontinued    Note to Pharmacy:  Jaci Carrel   : cabinet override      04/23/18 2340  04/24/18 0246   04/23/18 2245  vancomycin (VANCOCIN) 2,000 mg in sodium chloride 0.9 % 500 mL IVPB     2,000 mg 250 mL/hr over 120 Minutes Intravenous  Once 04/23/18 2238 04/24/18 0239   04/23/18 2245  ceFEPIme (MAXIPIME) 2 g injection  Status:  Discontinued    Note to Pharmacy:  Percell Miller   : cabinet override      04/23/18 2245 04/24/18 0246   04/23/18 2230  ceFEPIme (MAXIPIME) 2 g in sodium chloride 0.9 % 100 mL IVPB     2 g 200 mL/hr over 30 Minutes Intravenous  Once 04/23/18 2229 04/23/18 2330   04/23/18 2230  vancomycin (VANCOCIN) IVPB 1000 mg/200 mL premix  Status:  Discontinued     1,000 mg 200 mL/hr over 60 Minutes Intravenous  Once 04/23/18 2229 04/23/18 2237       Objective: Vitals:   04/24/18 0358 04/24/18 0400 04/24/18 0500 04/24/18 0600  BP:  (!) 185/96 (!) 186/81 (!) 170/89  Pulse:  95 99 99  Resp:  19 20 19   Temp: 98.9 F (37.2 C)     TempSrc: Axillary     SpO2:  95% 96% 95%  Weight:      Height:       No intake or output data in the 24 hours ending 04/24/18 1107 Filed Weights   04/23/18 2046 04/24/18 0210  Weight: 98.4 kg (217 lb) 102.6 kg (226 lb 3.1 oz)    Examination: General exam: Appears comfortable  HEENT: PERRLA, oral mucosa moist, no sclera icterus or thrush Respiratory system: Clear to auscultation. Respiratory effort normal.  Decreased breath sounds in right lower lung field Cardiovascular system: S1 & S2 heard, RRR.   Gastrointestinal system: Abdomen soft, non-tender, moderately distended. Normal bowel sound. No organomegaly Central nervous system: Alert and oriented. No focal neurological deficits. Extremities: No cyanosis, clubbing- edema extending from right foot to knee Skin: No rashes or ulcers-incision on the right knee appears to be healing well Psychiatry:  Mood & affect appropriate.     Data Reviewed: I have personally reviewed following labs and imaging studies  CBC: Recent Labs  Lab 04/23/18 2111  WBC 6.7  NEUTROABS  3.7  HGB 13.0  HCT 39.6  MCV 91.5  PLT 539   Basic Metabolic Panel: Recent Labs  Lab 04/23/18 2111  NA 138  K 3.2*  CL 102  CO2 25  GLUCOSE 126*  BUN 8  CREATININE 0.70  CALCIUM 8.6*   GFR: Estimated Creatinine Clearance: 86.2 mL/min (by C-G formula based on SCr of 0.7 mg/dL). Liver Function Tests: Recent Labs  Lab 04/23/18 2111  AST 47*  ALT 15  ALKPHOS 94  BILITOT 1.9*  PROT 6.7  ALBUMIN 2.9*   No results for input(s): LIPASE, AMYLASE in the last 168 hours. No results for input(s): AMMONIA in the last 168 hours. Coagulation Profile: No results for input(s): INR, PROTIME in the last  168 hours. Cardiac Enzymes: No results for input(s): CKTOTAL, CKMB, CKMBINDEX, TROPONINI in the last 168 hours. BNP (last 3 results) No results for input(s): PROBNP in the last 8760 hours. HbA1C: No results for input(s): HGBA1C in the last 72 hours. CBG: No results for input(s): GLUCAP in the last 168 hours. Lipid Profile: No results for input(s): CHOL, HDL, LDLCALC, TRIG, CHOLHDL, LDLDIRECT in the last 72 hours. Thyroid Function Tests: No results for input(s): TSH, T4TOTAL, FREET4, T3FREE, THYROIDAB in the last 72 hours. Anemia Panel: No results for input(s): VITAMINB12, FOLATE, FERRITIN, TIBC, IRON, RETICCTPCT in the last 72 hours. Urine analysis:    Component Value Date/Time   COLORURINE YELLOW 01/10/2012 1844   APPEARANCEUR CLEAR 01/10/2012 1844   LABSPEC 1.010 01/10/2012 1844   PHURINE 7.0 01/10/2012 1844   GLUCOSEU NEGATIVE 01/10/2012 1844   HGBUR NEGATIVE 01/10/2012 1844   BILIRUBINUR NEGATIVE 01/10/2012 1844   KETONESUR NEGATIVE 01/10/2012 1844   PROTEINUR NEGATIVE 01/10/2012 1844   UROBILINOGEN 0.2 01/10/2012 1844   NITRITE NEGATIVE 01/10/2012 1844   LEUKOCYTESUR NEGATIVE 01/10/2012 1844   Sepsis Labs: @LABRCNTIP (procalcitonin:4,lacticidven:4) )No results found for this or any previous visit (from the past 240 hour(s)).       Radiology Studies: Dg Chest  1 View  Result Date: 04/24/2018 CLINICAL DATA:  Status post right thoracentesis. EXAM: CHEST  1 VIEW COMPARISON:  04/23/2018 FINDINGS: Significant reduction in right pleural fluid volume with underlying airspace disease of the right lower lung representing either pneumonia or residual atelectasis. No pneumothorax. No pulmonary edema. Stable mild cardiac enlargement. IMPRESSION: No pneumothorax with significant reduction in right pleural fluid volume after thoracentesis. Underlying pneumonia versus atelectasis of the right lower lung. Electronically Signed   By: Aletta Edouard M.D.   On: 04/24/2018 09:13   Dg Chest 2 View  Result Date: 04/23/2018 CLINICAL DATA:  62 year old female with acute onset of cough and right-sided chest pain and shortness of breath. EXAM: CHEST - 2 VIEW COMPARISON:  Chest radiograph dated 01/20/2018 FINDINGS: There is a moderate size right-sided pleural effusion, new compared to the prior radiograph. There is associated compressive atelectasis of the majority of the right lower and right middle lobes. There is a small aerated portion of the lung in the right upper lobe. The left lung is clear. There is no pneumothorax. The right cardiac borders are silhouetted. No acute osseous pathology. IMPRESSION: Moderate right pleural effusion with compressive atelectasis of the majority of the right lung, new compared to the prior radiograph. Findings may be related to an infectious etiology although underlying mass is not entirely excluded. Clinical correlation and follow-up to resolution recommended. CT may provide additional evaluation if thoracentesis is considered for symptom relief. Electronically Signed   By: Anner Crete M.D.   On: 04/23/2018 21:36   Ct Angio Chest Pe W/cm &/or Wo Cm  Result Date: 04/23/2018 CLINICAL DATA:  62 y/o F; shortness of breath and abdominal pain with bloating. Knee surgery 1 month ago. EXAM: CT ANGIOGRAPHY CHEST CT ABDOMEN AND PELVIS WITH CONTRAST  TECHNIQUE: Multidetector CT imaging of the chest was performed using the standard protocol during bolus administration of intravenous contrast. Multiplanar CT image reconstructions and MIPs were obtained to evaluate the vascular anatomy. Multidetector CT imaging of the abdomen and pelvis was performed using the standard protocol during bolus administration of intravenous contrast. CONTRAST:  72m ISOVUE-370 IOPAMIDOL (ISOVUE-370) INJECTION 76% COMPARISON:  06/10/2011 CT abdomen and pelvis. FINDINGS: CTA CHEST FINDINGS Cardiovascular: Satisfactory opacification of pulmonary arteries. Respiratory motion artifact. No  pulmonary embolus identified. Mild coronary artery calcification. Normal heart size. No pericardial effusion. Normal caliber thoracic aorta. Mediastinum/Nodes: No enlarged mediastinal, hilar, or axillary lymph nodes. Thyroid gland, trachea, and esophagus demonstrate no significant findings. Lungs/Pleura: Large right pleural effusion with atelectasis of right lower lobe and partial atelectasis of the right upper lobes and middle lobe. Clear left lung. Musculoskeletal: No chest wall abnormality. No acute or significant osseous findings. Review of the MIP images confirms the above findings. CT ABDOMEN and PELVIS FINDINGS Hepatobiliary: Lobulation of liver contour with enlargement of the left lobe compatible with cirrhosis. Normal gallbladder. No biliary ductal dilatation. Pancreas: Unremarkable. No pancreatic ductal dilatation or surrounding inflammatory changes. Spleen: Normal in size without focal abnormality. Adrenals/Urinary Tract: Adrenal glands are unremarkable. Kidneys are normal, without renal calculi, focal lesion, or hydronephrosis. Bladder is unremarkable. Stomach/Bowel: Stomach is within normal limits. Appendix appears normal. No evidence of bowel wall thickening, distention, or inflammatory changes. Vascular/Lymphatic: No significant vascular findings are present. Enlargement of mesenteric lymph  nodes measuring up to 18 x 14 mm (series 13, image 41). Reproductive: Uterus and bilateral adnexa are unremarkable. Other: Large volume of ascites. Musculoskeletal: No fracture is seen. Stable L1 mild anterior compression deformity. Review of the MIP images confirms the above findings. IMPRESSION: 1. Respiratory motion artifact.  No pulmonary embolus identified. 2. Large right pleural effusion with atelectasis of right lower lobe and partial atelectasis of right upper and middle lobes. 3. Lobulated liver with enlarged left lobe compatible with cirrhosis. 4. Large volume of ascites. 5. Mild enlargement of mesenteric lymph nodes may represent underlying inflammation or be due to venous congestion. Electronically Signed   By: Kristine Garbe M.D.   On: 04/23/2018 22:12   Ct Abdomen Pelvis W Contrast  Result Date: 04/23/2018 CLINICAL DATA:  62 y/o F; shortness of breath and abdominal pain with bloating. Knee surgery 1 month ago. EXAM: CT ANGIOGRAPHY CHEST CT ABDOMEN AND PELVIS WITH CONTRAST TECHNIQUE: Multidetector CT imaging of the chest was performed using the standard protocol during bolus administration of intravenous contrast. Multiplanar CT image reconstructions and MIPs were obtained to evaluate the vascular anatomy. Multidetector CT imaging of the abdomen and pelvis was performed using the standard protocol during bolus administration of intravenous contrast. CONTRAST:  50m ISOVUE-370 IOPAMIDOL (ISOVUE-370) INJECTION 76% COMPARISON:  06/10/2011 CT abdomen and pelvis. FINDINGS: CTA CHEST FINDINGS Cardiovascular: Satisfactory opacification of pulmonary arteries. Respiratory motion artifact. No pulmonary embolus identified. Mild coronary artery calcification. Normal heart size. No pericardial effusion. Normal caliber thoracic aorta. Mediastinum/Nodes: No enlarged mediastinal, hilar, or axillary lymph nodes. Thyroid gland, trachea, and esophagus demonstrate no significant findings. Lungs/Pleura: Large  right pleural effusion with atelectasis of right lower lobe and partial atelectasis of the right upper lobes and middle lobe. Clear left lung. Musculoskeletal: No chest wall abnormality. No acute or significant osseous findings. Review of the MIP images confirms the above findings. CT ABDOMEN and PELVIS FINDINGS Hepatobiliary: Lobulation of liver contour with enlargement of the left lobe compatible with cirrhosis. Normal gallbladder. No biliary ductal dilatation. Pancreas: Unremarkable. No pancreatic ductal dilatation or surrounding inflammatory changes. Spleen: Normal in size without focal abnormality. Adrenals/Urinary Tract: Adrenal glands are unremarkable. Kidneys are normal, without renal calculi, focal lesion, or hydronephrosis. Bladder is unremarkable. Stomach/Bowel: Stomach is within normal limits. Appendix appears normal. No evidence of bowel wall thickening, distention, or inflammatory changes. Vascular/Lymphatic: No significant vascular findings are present. Enlargement of mesenteric lymph nodes measuring up to 18 x 14 mm (series 13, image 41). Reproductive: Uterus and  bilateral adnexa are unremarkable. Other: Large volume of ascites. Musculoskeletal: No fracture is seen. Stable L1 mild anterior compression deformity. Review of the MIP images confirms the above findings. IMPRESSION: 1. Respiratory motion artifact.  No pulmonary embolus identified. 2. Large right pleural effusion with atelectasis of right lower lobe and partial atelectasis of right upper and middle lobes. 3. Lobulated liver with enlarged left lobe compatible with cirrhosis. 4. Large volume of ascites. 5. Mild enlargement of mesenteric lymph nodes may represent underlying inflammation or be due to venous congestion. Electronically Signed   By: Kristine Garbe M.D.   On: 04/23/2018 22:12      Scheduled Meds: . aspirin  81 mg Oral BID  . atenolol  50 mg Oral q morning - 10a  . Eluxadoline  75 mg Oral BID  . enoxaparin  (LOVENOX) injection  40 mg Subcutaneous Q24H  . furosemide  40 mg Oral BID  . lidocaine      . pantoprazole  40 mg Oral q morning - 10a  . spironolactone  50 mg Oral Daily   Continuous Infusions: . ceFEPime (MAXIPIME) IV Stopped (04/24/18 0850)  . vancomycin       LOS: 0 days    Time spent in minutes: Towner, MD [facility] Pager: [email or web address] Password [email or web address] 04/24/2018, 11:07 AM

## 2018-04-25 ENCOUNTER — Inpatient Hospital Stay (HOSPITAL_COMMUNITY): Payer: 59

## 2018-04-25 ENCOUNTER — Encounter: Payer: 59 | Admitting: Physical Therapy

## 2018-04-25 ENCOUNTER — Encounter (HOSPITAL_COMMUNITY): Payer: Self-pay | Admitting: Cardiology

## 2018-04-25 DIAGNOSIS — J9601 Acute respiratory failure with hypoxia: Principal | ICD-10-CM

## 2018-04-25 DIAGNOSIS — Z9889 Other specified postprocedural states: Secondary | ICD-10-CM

## 2018-04-25 DIAGNOSIS — R0602 Shortness of breath: Secondary | ICD-10-CM

## 2018-04-25 DIAGNOSIS — R600 Localized edema: Secondary | ICD-10-CM

## 2018-04-25 DIAGNOSIS — R0600 Dyspnea, unspecified: Secondary | ICD-10-CM

## 2018-04-25 LAB — BODY FLUID CELL COUNT WITH DIFFERENTIAL
EOS FL: 0 %
Lymphs, Fluid: 66 %
Monocyte-Macrophage-Serous Fluid: 29 % — ABNORMAL LOW (ref 50–90)
Neutrophil Count, Fluid: 5 % (ref 0–25)
WBC FLUID: 640 uL (ref 0–1000)

## 2018-04-25 LAB — GRAM STAIN

## 2018-04-25 LAB — ECHOCARDIOGRAM COMPLETE
HEIGHTINCHES: 64 in
Weight: 3541.47 oz

## 2018-04-25 LAB — BASIC METABOLIC PANEL
Anion gap: 8 (ref 5–15)
BUN: 8 mg/dL (ref 6–20)
CO2: 28 mmol/L (ref 22–32)
Calcium: 8.1 mg/dL — ABNORMAL LOW (ref 8.9–10.3)
Chloride: 104 mmol/L (ref 101–111)
Creatinine, Ser: 0.66 mg/dL (ref 0.44–1.00)
GFR calc non Af Amer: >60 mL/min (ref >60)
Glucose, Bld: 101 mg/dL — ABNORMAL HIGH (ref 65–99)
POTASSIUM: 3.2 mmol/L — AB (ref 3.5–5.1)
SODIUM: 140 mmol/L (ref 135–145)

## 2018-04-25 LAB — ALBUMIN: ALBUMIN: 2.4 g/dL — AB (ref 3.5–5.0)

## 2018-04-25 LAB — ALBUMIN, PLEURAL OR PERITONEAL FLUID: Albumin, Fluid: <3 g/dL

## 2018-04-25 MED ORDER — MAGNESIUM SULFATE 4 GM/100ML IV SOLN
4.0000 g | Freq: Once | INTRAVENOUS | Status: AC
Start: 2018-04-25 — End: 2018-04-25
  Administered 2018-04-25: 4 g via INTRAVENOUS
  Filled 2018-04-25: qty 100

## 2018-04-25 MED ORDER — POTASSIUM CHLORIDE CRYS ER 20 MEQ PO TBCR
40.0000 meq | EXTENDED_RELEASE_TABLET | ORAL | Status: AC
  Administered 2018-04-25 (×2): 40 meq via ORAL
  Filled 2018-04-25 (×2): qty 2

## 2018-04-25 MED ORDER — LIDOCAINE HCL 1 % IJ SOLN
INTRAMUSCULAR | Status: AC
  Filled 2018-04-25: qty 10

## 2018-04-25 NOTE — Progress Notes (Signed)
Preliminary notes--Right lower extremity venous study completed. Negative for DVT.    Limited study due to patient body habitus and post surgical edema.   Hongying Trent Gabler (RDMS RVT) 04/25/18 11:28 AM

## 2018-04-25 NOTE — Progress Notes (Signed)
  Echocardiogram 2D Echocardiogram has been performed.  Merrie Roof F 04/25/2018, 11:31 AM

## 2018-04-25 NOTE — Progress Notes (Signed)
SATURATION QUALIFICATIONS: (This note is used to comply with regulatory documentation for home oxygen)   Patient Saturations on Room Air while Ambulating = 85%    Please briefly explain why patient needs home oxygen:

## 2018-04-25 NOTE — Progress Notes (Addendum)
PROGRESS NOTE    Dawn Moon   OVZ:[id number]  DOB: [birth year]  DOA: 04/23/2018 PCP: Leamon Arnt, MD   Brief Narrative:  Dawn Moon is a 62 year old female with IBS, paroxysmal SVT, hypertension, [name] who underwent a right knee replacement on 4/4 and was discharged from the hospital on 4/5. She presents for shortness of breath steadily worsening since her surgery.  She had doubled her Lasix to 40 mg daily because her right leg has been swollen as well.  She became suddenly more short of breath and presented to the ER.  CTA was done to rule out PE and showed large right-sided pleural effusion with with atelectasis of the right lower lobe and partial atelectasis of the right mid and upper lobes, lobulated liver with enlarged left lobe  compatible with cirrhosis, large volume ascites and mild enlargement of mesenteric lymph nodes. She was admitted to the stepdown unit and an IR consult for thoracentesis was placed.   Subjective: No new complaints.   Feels that she did not have much urine output yesterday.  Abdomen is still quite swollen.  Awaiting 2D echo, venous duplex, paracentesis awaiting to ambulate.  Assessment & Plan:   Principal Problem:   Acute hypoxic respiratory failure  -Secondary to right pleural effusion which is likely hepatic hydrothorax She states she is feeling much better after the thoracentesis 2 L of fluid - initial results reveal turbid fluid with mesothelial cells, 724 WBCs 40% lymphocytes and 56% monocytes -Fluid is transudate  -  2D echo does not show any heart failure Has been weaned to 2 L which we may be able to wean further-once paracentesis done  Active Problems: Ascites  -Most likely secondary to cirrhosis and hepatic hydrothorax -have started Lasix 40 twice daily (she was taking 20 once a day and had increased it to 40 once a day) and Aldactone 50 mg daily and placed her on a low-sodium diet -Follow I&O's and daily weights -1 L negative  balance yesterday We will have paracentesis and albumin today as ordered by GI   Cirrhosis of the liver -She has had nonalcoholic steatohepatitis -Hep C antibody has been checked in the past and is noted to be negative in our computer system. -She is followed w by [facility] but the patient would like another GI opinion-[facility] will obtain records from [place] as far as what work-up she is already had and will further milligrams work-up as an outpatient  Hypokalemia and hypomagnesemia -Replacing-continue to follow  Right leg edema -Occurring in relation to right knee surgery-venous duplex negative-Place teds    Essential hypertension -Continue atenolol- Lasix and Aldactone added    NSVT (nonsustained ventricular tachycardia) -Was started on atenolol by Dr. 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 Wall thickness was   normal. Systolic function was normal. The estimated ejection   fraction was in the range of 60% to 65%. Wall motion was normal;   there were no regional wall motion abnormalities. There was no   evidence of elevated ventricular filling pressure by Doppler   parameters. - Left atrium: The atrium  was mildly dilated.  Antimicrobials:  Anti-infectives (From admission, onward)   Start     Dose/Rate Route Frequency Ordered Stop   04/24/18 2200  vancomycin (VANCOCIN) IVPB 1000 mg/200 mL premix  Status:  Discontinued     1,000 mg 200 mL/hr over 60 Minutes Intravenous Every 24 hours 04/24/18 0256 04/24/18 1444   04/24/18 1100  vancomycin (VANCOCIN) 1,250 mg  in sodium chloride 0.9 % 250 mL IVPB  Status:  Discontinued     1,250 mg 166.7 mL/hr over 90 Minutes Intravenous Every 12 hours 04/23/18 2242 04/24/18 0215   04/24/18 0800  ceFEPIme (MAXIPIME) 2 g in sodium chloride 0.9 % 100 mL IVPB  Status:  Discontinued     2 g 200 mL/hr over 30 Minutes Intravenous Every 8 hours 04/23/18 2242 04/24/18 0215   04/24/18 0800  ceFEPIme (MAXIPIME) 2 g in sodium chloride 0.9 % 100 mL IVPB  Status:  Discontinued     2 g 200 mL/hr over 30 Minutes Intravenous Every 8 hours 04/24/18 0256 04/24/18 1444   04/23/18 2340  vancomycin (VANCOCIN) 1000 MG powder  Status:  Discontinued    Note to Pharmacy:  Jaci Carrel   : cabinet override      04/23/18 2340 04/24/18 0246   04/23/18 2245  vancomycin (VANCOCIN) 2,000 mg in sodium chloride 0.9 % 500 mL IVPB     2,000 mg 250 mL/hr over 120 Minutes Intravenous  Once 04/23/18 2238 04/24/18 0239   04/23/18 2245  ceFEPIme (MAXIPIME) 2 g injection  Status:  Discontinued    Note to Pharmacy:  Percell Miller   : cabinet override      04/23/18 2245 04/24/18 0246   04/23/18 2230  ceFEPIme (MAXIPIME) 2 g in sodium chloride 0.9 % 100 mL IVPB     2 g 200 mL/hr over 30 Minutes Intravenous  Once 04/23/18 2229 04/23/18 2330   04/23/18 2230  vancomycin (VANCOCIN) IVPB 1000 mg/200 mL premix  Status:  Discontinued     1,000 mg 200 mL/hr over 60 Minutes Intravenous  Once 04/23/18 2229 04/23/18 2237       Objective: Vitals:   04/25/18 0500 04/25/18 0600 04/25/18 0800 04/25/18 1200  BP:  126/63 (!) 149/81 (!) 122/52  Pulse:  81 85 79  Resp:  14 (!) 22 (!) 21  Temp:   98.1 F (36.7 C) 98.3 F (36.8 C)  TempSrc:   [place]  SpO2:  98% 99% 98%  Weight: 100.4 kg (221 lb 5.5 oz)     Height:        Intake/Output Summary (Last 24 hours) at 04/25/2018 1450 Last data filed at 04/25/2018 1130 Gross per 24 hour  Intake 100 ml  Output 350 ml  Net -250 ml   Filed Weights   04/23/18 2046 04/24/18 0210 04/25/18 0500  Weight: 98.4 kg  (217 lb) 102.6 kg (226 lb 3.1 oz) 100.4 kg (221 lb 5.5 oz)    Examination: General exam: Appears comfortable  HEENT: PERRLA, oral mucosa moist, no sclera icterus or thrush Respiratory system: Clear to auscultation. Respiratory effort normal.  Decreased breath sounds in right lower lung field Cardiovascular system: S1 & S2 heard, RRR.   Gastrointestinal system: Abdomen soft, non-tender, moderately distended. Normal bowel sound. No organomegaly Central nervous system: Alert and oriented. No focal neurological deficits. Extremities: No cyanosis, clubbing- less edema in right leg today Skin: No rashes or ulcers-incision on the right knee appears to be healing well Psychiatry:  Mood & affect appropriate.  Data Reviewed: I have personally reviewed following labs and imaging studies  CBC: Recent Labs  Lab 04/23/18 2111  WBC 6.7  NEUTROABS 3.7  HGB 13.0  HCT 39.6  MCV 91.5  PLT 657   Basic Metabolic Panel: Recent Labs  Lab 04/23/18 2111 04/24/18 0939 04/25/18 0432  NA 138 140 140  K 3.2* 3.3* 3.2*  CL 102 104 104  CO2 25 24 28   GLUCOSE 126* 103* 101*  BUN 8 8 8   CREATININE 0.70 0.55 0.66  CALCIUM 8.6* 8.6* 8.1*  MG  --  1.6*  --    GFR: Estimated Creatinine Clearance: 85.1 mL/min (by C-G formula based on SCr of 0.66 mg/dL). Liver Function Tests: Recent Labs  Lab 04/23/18 2111 04/25/18 0432  AST 47*  --   ALT 15  --   ALKPHOS 94  --   BILITOT 1.9*  --   PROT 6.7  --   ALBUMIN 2.9* 2.4*   No results for input(s): LIPASE, AMYLASE in the last 168 hours. No results for input(s): AMMONIA in the last 168 hours. Coagulation Profile: Recent Labs  Lab 04/24/18 1101  INR 1.31   Cardiac Enzymes: No results for input(s): CKTOTAL, CKMB, CKMBINDEX, TROPONINI in the last 168 hours. BNP (last 3 results) No results for input(s): PROBNP in the last 8760 hours. HbA1C: No results for input(s): HGBA1C in the last 72 hours. CBG: No results for input(s): GLUCAP in the  last 168 hours. Lipid Profile: No results for input(s): CHOL, HDL, LDLCALC, TRIG, CHOLHDL, LDLDIRECT in the last 72 hours. Thyroid Function Tests: No results for input(s): TSH, T4TOTAL, FREET4, T3FREE, THYROIDAB in the last 72 hours. Anemia Panel: No results for input(s): VITAMINB12, FOLATE, FERRITIN, TIBC, IRON, RETICCTPCT in the last 72 hours. Urine analysis:    Component Value Date/Time   COLORURINE YELLOW 01/10/2012 1844   APPEARANCEUR CLEAR 01/10/2012 1844   LABSPEC 1.010 01/10/2012 1844   PHURINE 7.0 01/10/2012 1844   GLUCOSEU NEGATIVE 01/10/2012 1844   HGBUR NEGATIVE 01/10/2012 1844   BILIRUBINUR NEGATIVE 01/10/2012 [place] 01/10/2012 1844   PROTEINUR NEGATIVE 01/10/2012 1844   UROBILINOGEN 0.2 01/10/2012 1844   NITRITE NEGATIVE 01/10/2012 1844   LEUKOCYTESUR NEGATIVE 01/10/2012 1844   Sepsis Labs: @LABRCNTIP (procalcitonin:4,lacticidven:4) ) Recent Results (from the past 240 hour(s))  Blood Culture (routine x 2)     Status: None (Preliminary result)   Collection Time: 04/23/18 10:35 PM  Result Value Ref Range Status   Specimen Description   Final    BLOOD LEFT ARM Performed at [facility], [address] [place], [place] [address]    Special Requests   Final    BOTTLES DRAWN AEROBIC AND ANAEROBIC Blood Culture adequate volume Performed at [facility], [address] [place], [place] [address]    Culture   Final    NO GROWTH 1 DAY Performed at [facility], [place] [address], [place], [place] [address]    Report Status PENDING  Incomplete  Blood Culture (routine x 2)     Status: None (Preliminary result)   Collection Time: 04/23/18 10:45 PM  Result Value Ref Range Status   Specimen Description   Final    BLOOD RIGHT FOREARM Performed at [facility], [address] [place], [place] [address]    Special Requests   Final    BOTTLES DRAWN AEROBIC AND ANAEROBIC Blood Culture results may not be optimal due  to an inadequate  volume of blood received in culture bottles Performed at [facility], [address] [place], [place] [address]    Culture   Final    NO GROWTH 1 DAY Performed at [facility], [place] [address], [place], [place] [address]    Report Status PENDING  Incomplete  Culture, body fluid-bottle     Status: None (Preliminary result)   Collection Time: 04/24/18  8:45 AM  Result Value Ref Range Status   Specimen Description PLEURAL FLUID  Final   Special Requests BOTTLES DRAWN AEROBIC AND ANAEROBIC  Final   Culture   Final    NO GROWTH < 24 HOURS Performed at [facility], [place] [address], [place], [place] [address]    Report Status PENDING  Incomplete  Gram stain     Status: None   Collection Time: 04/24/18  8:45 AM  Result Value Ref Range Status   Specimen Description PLEURAL FLUID  Final   Special Requests NONE  Final   Gram Stain   Final    WBC PRESENT,BOTH PMN AND MONONUCLEAR NO ORGANISMS SEEN CYTOSPIN SMEAR Performed at [facility], [address], [place], [place] [address]    Report Status 04/24/2018 FINAL  Final  MRSA PCR Screening     Status: None   Collection Time: 04/24/18 11:06 AM  Result Value Ref Range Status   MRSA by PCR NEGATIVE NEGATIVE Final    Comment:        The GeneXpert MRSA Assay (FDA approved for NASAL specimens only), is one component of a comprehensive MRSA colonization surveillance program. It is not intended to diagnose MRSA infection nor to guide or monitor treatment for MRSA infections. Performed at [facility], [place] [address] [place], [place] [address]          Radiology Studies: Dg Chest 1 View  Result Date: 04/24/2018 CLINICAL DATA:  Status post right thoracentesis. EXAM: CHEST  1 VIEW COMPARISON:  04/23/2018 FINDINGS: Significant reduction in right pleural fluid volume with underlying airspace disease of the right lower lung representing either pneumonia or residual  atelectasis. No pneumothorax. No pulmonary edema. Stable mild cardiac enlargement. IMPRESSION: No pneumothorax with significant reduction in right pleural fluid volume after thoracentesis. Underlying pneumonia versus atelectasis of the right lower lung. Electronically Signed   By: Aletta Edouard M.D.   On: 04/24/2018 09:13   Dg Chest 2 View  Result Date: 04/23/2018 CLINICAL DATA:  62 year old female with acute onset of cough and right-sided chest pain and shortness of breath. EXAM: CHEST - 2 VIEW COMPARISON:  Chest radiograph dated 01/20/2018 FINDINGS: There is a moderate size right-sided pleural effusion, new compared to the prior radiograph. There is associated compressive atelectasis of the majority of the right lower and right middle lobes. There is a small aerated portion of the lung in the right upper lobe. The left lung is clear. There is no pneumothorax. The right cardiac borders are silhouetted. No acute osseous pathology. IMPRESSION: Moderate right pleural effusion with compressive atelectasis of the majority of the right lung, new compared to the prior radiograph. Findings may be related to an infectious etiology although underlying mass is not entirely excluded. Clinical correlation and follow-up to resolution recommended. CT may provide additional evaluation if thoracentesis is considered for symptom relief. Electronically Signed   By: Anner Crete M.D.   On: 04/23/2018 21:36   Ct Angio Chest Pe W/cm &/or Wo Cm  Result Date: 04/23/2018 CLINICAL DATA:  62 y/o F; shortness of breath and abdominal pain with bloating. Knee surgery 1 month ago. EXAM: CT ANGIOGRAPHY CHEST CT ABDOMEN AND PELVIS WITH CONTRAST TECHNIQUE: Multidetector CT imaging of the chest was performed using the standard protocol during bolus administration of intravenous contrast. Multiplanar CT image reconstructions and MIPs were obtained to evaluate the vascular anatomy. Multidetector CT imaging of the abdomen and pelvis was  performed using the standard protocol during bolus administration of intravenous contrast. CONTRAST:  45m ISOVUE-370 IOPAMIDOL (ISOVUE-370) INJECTION 76% COMPARISON:  06/10/2011 CT abdomen and pelvis. FINDINGS: CTA CHEST FINDINGS Cardiovascular: Satisfactory opacification of pulmonary arteries. Respiratory motion artifact. No pulmonary embolus identified. Mild coronary artery calcification. Normal heart size. No pericardial effusion. Normal caliber thoracic aorta. Mediastinum/Nodes: No enlarged mediastinal, hilar, or axillary lymph nodes. Thyroid gland, trachea, and esophagus demonstrate no significant findings. Lungs/Pleura: Large right pleural effusion with atelectasis of right lower lobe and partial atelectasis of the right upper lobes and middle lobe. Clear left lung. Musculoskeletal: No chest wall abnormality. No acute or significant osseous findings. Review of the MIP images confirms the above findings. CT ABDOMEN and PELVIS FINDINGS Hepatobiliary: Lobulation of liver contour with enlargement of the left lobe compatible with cirrhosis. Normal gallbladder. No biliary ductal dilatation. Pancreas: Unremarkable. No pancreatic ductal dilatation or surrounding inflammatory changes. Spleen: Normal in size without focal abnormality. Adrenals/Urinary Tract: Adrenal glands are unremarkable. Kidneys are normal, without renal calculi, focal lesion, or hydronephrosis. Bladder is unremarkable. Stomach/Bowel: Stomach is within normal limits. Appendix appears normal. No evidence of bowel wall thickening, distention, or inflammatory changes. Vascular/Lymphatic: No significant vascular findings are present. Enlargement of mesenteric lymph nodes measuring up to 18 x 14 mm (series 13, image 41). Reproductive: Uterus and bilateral adnexa are unremarkable. Other: Large volume of ascites. Musculoskeletal: No fracture is seen. Stable L1 mild anterior compression deformity. Review of the MIP images confirms the above findings.  IMPRESSION: 1. Respiratory motion artifact.  No pulmonary embolus identified. 2. Large right pleural effusion with atelectasis of right lower lobe and partial atelectasis of right upper and middle lobes. 3. Lobulated liver with enlarged left lobe compatible with cirrhosis. 4. Large volume of ascites. 5. Mild enlargement of mesenteric lymph nodes may represent underlying inflammation or be due to venous congestion. Electronically Signed   By: LKristine GarbeM.D.   On: 04/23/2018 22:12   Ct Abdomen Pelvis W Contrast  Result Date: 04/23/2018 CLINICAL DATA:  62y/o F; shortness of breath and abdominal pain with bloating. Knee surgery 1 month ago. EXAM: CT ANGIOGRAPHY CHEST CT ABDOMEN AND PELVIS WITH CONTRAST TECHNIQUE: Multidetector CT imaging of the chest was performed using the standard protocol during bolus administration of intravenous contrast. Multiplanar CT image reconstructions and MIPs were obtained to evaluate the vascular anatomy. Multidetector CT imaging of the abdomen and pelvis was performed using the standard protocol during bolus administration of intravenous contrast. CONTRAST:  668mISOVUE-370 IOPAMIDOL (ISOVUE-370) INJECTION 76% COMPARISON:  06/10/2011 CT abdomen and pelvis. FINDINGS: CTA CHEST FINDINGS Cardiovascular: Satisfactory opacification of pulmonary arteries. Respiratory motion artifact. No pulmonary embolus identified. Mild coronary artery calcification. Normal heart size. No pericardial effusion. Normal caliber thoracic aorta. Mediastinum/Nodes: No enlarged mediastinal, hilar, or axillary lymph nodes. Thyroid gland, trachea, and esophagus demonstrate no significant findings. Lungs/Pleura: Large right pleural effusion with atelectasis of right lower lobe and partial atelectasis of the right upper lobes and middle lobe. Clear left lung. Musculoskeletal: No chest wall abnormality. No acute or significant osseous findings. Review of the MIP images confirms the above  findings. CT  ABDOMEN and PELVIS FINDINGS Hepatobiliary: Lobulation of liver contour with enlargement of the left lobe compatible with cirrhosis. Normal gallbladder. No biliary ductal dilatation. Pancreas: Unremarkable. No pancreatic ductal dilatation or surrounding inflammatory changes. Spleen: Normal in size without focal abnormality. Adrenals/Urinary Tract: Adrenal glands are unremarkable. Kidneys are normal, without renal calculi, focal lesion, or hydronephrosis. Bladder is unremarkable. Stomach/Bowel: Stomach is within normal limits. Appendix appears normal. No evidence of bowel wall thickening, distention, or inflammatory changes. Vascular/Lymphatic: No significant vascular findings are present. Enlargement of mesenteric lymph nodes measuring up to 18 x 14 mm (series 13, image 41). Reproductive: Uterus and bilateral adnexa are unremarkable. Other: Large volume of ascites. Musculoskeletal: No fracture is seen. Stable L1 mild anterior compression deformity. Review of the MIP images confirms the above findings. IMPRESSION: 1. Respiratory motion artifact.  No pulmonary embolus identified. 2. Large right pleural effusion with atelectasis of right lower lobe and partial atelectasis of right upper and middle lobes. 3. Lobulated liver with enlarged left lobe compatible with cirrhosis. 4. Large volume of ascites. 5. Mild enlargement of mesenteric lymph nodes may represent underlying inflammation or be due to venous congestion. Electronically Signed   By: Kristine Garbe M.D.   On: 04/23/2018 22:12   US Thoracentesis Asp Pleural Space W/img Guide  Result Date: 04/24/2018 INDICATION: History of NASH cirrhosis. Shortness of breath. Right pleural effusion. Request for diagnostic and therapeutic thoracentesis. EXAM: ULTRASOUND GUIDED RIGHT THORACENTESIS MEDICATIONS: 1% Lidocaine = 10 mL COMPLICATIONS: None immediate. PROCEDURE: An ultrasound guided thoracentesis was thoroughly discussed with the patient and questions  answered. The benefits, risks, alternatives and complications were also discussed. The patient understands and wishes to proceed with the procedure. Written consent was obtained. Ultrasound was performed to localize and mark an adequate pocket of fluid in the right chest. The area was then prepped and draped in the normal sterile fashion. 1% Lidocaine was used for local anesthesia. Under ultrasound guidance a 6 Fr Safe-T-Centesis catheter was introduced. Thoracentesis was performed. The catheter was removed and a dressing applied. FINDINGS: A total of approximately 2 liters of cloudy yellow fluid was removed. Samples were sent to the laboratory as requested by the clinical team. IMPRESSION: Successful ultrasound guided right thoracentesis yielding 2 liters of pleural fluid. No pneumothorax on post procedure chest X-ray. Read by: Gareth Eagle, PA-C Electronically Signed   By: Aletta Edouard M.D.   On: 04/24/2018 12:56      Scheduled Meds: . aspirin  81 mg Oral BID  . atenolol  50 mg Oral q morning - 10a  . Eluxadoline  75 mg Oral BID  . enoxaparin (LOVENOX) injection  40 mg Subcutaneous Q24H  . furosemide  40 mg Intravenous BID  . lidocaine      . pantoprazole  40 mg Oral q morning - 10a  . potassium chloride  40 mEq Oral Q4H  . spironolactone  100 mg Oral Daily   Continuous Infusions: . albumin human       LOS: 1 day    Time spent in minutes: 40    Debbe Odea, MD [facility] Pager: [email or web address] Password [email or web address] 04/25/2018, 2:50 PM

## 2018-04-25 NOTE — Plan of Care (Signed)
  Problem: Nutrition: Goal: Adequate nutrition will be maintained Outcome: Progressing   Problem: Elimination: Goal: Will not experience complications related to bowel motility Outcome: Progressing   Problem: Safety: Goal: Ability to remain free from injury will improve Outcome: Progressing   

## 2018-04-25 NOTE — Progress Notes (Addendum)
[facility] Progress Note   Chief Complaint:  Decompensated cirrhosis    SUBJECTIVE:    breathing much better since thoracentesis yesterday.   ASSESSMENT AND PLAN:   1. 62 yo female admitted 04/23/18 with respiratory failure from large right pleural effusion. She had ascites and cirrhotic liver on imaging. Cardiology consulted, didn't feel problems cardiac related but getting echo. She seems to have NASH cirrhosis, decompensated after recent knee surgery.  -2 Liter right thoracentesis yesterday. For LVP with albumin today. Feels much better after  thoracentesis.  -we changed lasix to IV BID yesterday. Renal function remains normal today. Weight down 5 pounds overnight after thoracentesis and changing lasix to IV BID.  -am BMET -continue low sodium diet, I&0, daily weights  -eventual outpatient EGD for varices screening with Dr. Carlean Purl  2. RLE swelling discomfort, post right TKA recently. Just had doppler study to rule out DVT  3. FHx of celiac, will make sure she has been screened when we get her records from [place].     OBJECTIVE:      Vital signs in last 24 hours: Temp:  [98 F (36.7 C)-98.6 F (37 C)] 98.1 F (36.7 C) (05/06 0800) Pulse Rate:  [81-87] 85 (05/06 0800) Resp:  [14-22] 22 (05/06 0800) BP: (120-153)/(63-81) 149/81 (05/06 0800) SpO2:  [98 %-100 %] 99 % (05/06 0800) Weight:  [221 lb 5.5 oz (100.4 kg)] 221 lb 5.5 oz (100.4 kg) (05/06 0500)   General:   Alert, pleasant white female in NAD EENT:  Normal hearing, non icteric sclera, conjunctive pink.  Heart:  Regular rate and rhythm, 2+ RLE edema  Pulm: Normal respiratory effort, decrease breath soundsi n RLL Abdomen:  Soft, mildly distended, nontender.  Normal bowel sounds Neurologic:  Alert and  oriented x4;  grossly normal neurologically. Psych:  Pleasant, cooperative.  Normal mood and affect.   Intake/Output from previous day: 05/05 0701 - 05/06 0700 In: -  Out: 650  [Urine:650] Intake/Output this shift: No intake/output data recorded.  Lab Results: Recent Labs    04/23/18 2111  WBC 6.7  HGB 13.0  HCT 39.6  PLT 227   BMET Recent Labs    04/23/18 2111 04/24/18 0939 04/25/18 0432  NA 138 140 140  K 3.2* 3.3* 3.2*  CL 102 104 104  CO2 25 24 28   GLUCOSE 126* 103* 101*  BUN 8 8 8   CREATININE 0.70 0.55 0.66  CALCIUM 8.6* 8.6* 8.1*   LFT Recent Labs    04/23/18 2111  PROT 6.7  ALBUMIN 2.9*  AST 47*  ALT 15  ALKPHOS 94  BILITOT 1.9*   PT/INR Recent Labs    04/24/18 1101  LABPROT 16.1*  INR 1.31    Principal Problem:   Acute respiratory failure (HCC) Active Problems:   Essential hypertension   NSVT (nonsustained ventricular tachycardia) (HCC)   IBS (irritable bowel syndrome)   Cirrhosis of liver with ascites (HCC)   Pleural effusion, right   Leg edema, right   Ascites   Hypoxia     LOS: 1 day   Tye Savoy ,NP 04/25/2018, 11:15 AM  Pager number [phone]    Attending physician's note   I have taken an interval history, reviewed the chart and examined the patient. I agree with the Advanced Practitioner's note, impression and recommendations.  62 year old female with new diagnosis of cirrhosis decompensated with ascites and pleural effusion status post right TKA.  Hep A& B pending Hep C negative s/p thoracentesis and paracentesis, ascitic  fluid cell count and SAAG pending Check ANA, anti sm AB, AMA, alpha1 antitrypsin level to exclude other etiology of chronic liver disease Likely [name] cirrhosis with acute decompensation post surgery Continue Lasix and Aldactone Follow-up I & O Monitor daily BMP   Damaris Hippo , MD [phone]

## 2018-04-25 NOTE — Progress Notes (Addendum)
Progress Note  Patient Name: Dawn Moon Date of Encounter: 04/25/2018  Primary Cardiologist: Kirk Ruths, MD   Subjective   No chest pain, pain rt lateral area, + SOB due to ascites.  For paracentesis soon.    Inpatient Medications    Scheduled Meds: . aspirin  81 mg Oral BID  . atenolol  50 mg Oral q morning - 10a  . Eluxadoline  75 mg Oral BID  . enoxaparin (LOVENOX) injection  40 mg Subcutaneous Q24H  . furosemide  40 mg Intravenous BID  . pantoprazole  40 mg Oral q morning - 10a  . potassium chloride  40 mEq Oral Q4H  . spironolactone  100 mg Oral Daily   Continuous Infusions: . albumin human    . magnesium sulfate 1 - 4 g bolus IVPB     PRN Meds: acetaminophen **OR** acetaminophen, ondansetron **OR** ondansetron (ZOFRAN) IV, promethazine   Vital Signs    Vitals:   04/25/18 0354 04/25/18 0500 04/25/18 0600 04/25/18 0800  BP:   126/63 (!) 149/81  Pulse:   81 85  Resp:   14 (!) 22  Temp: 98.5 F (36.9 C)   98.1 F (36.7 C)  TempSrc: Oral   Oral  SpO2:   98% 99%  Weight:  221 lb 5.5 oz (100.4 kg)    Height:        Intake/Output Summary (Last 24 hours) at 04/25/2018 0919 Last data filed at 04/24/2018 2039 Gross per 24 hour  Intake -  Output 650 ml  Net -650 ml   Filed Weights   04/23/18 2046 04/24/18 0210 04/25/18 0500  Weight: 217 lb (98.4 kg) 226 lb 3.1 oz (102.6 kg) 221 lb 5.5 oz (100.4 kg)    Telemetry    SR with PACs. - Personally Reviewed  ECG    No new - Personally Reviewed  Physical Exam   GEN: No acute distress.   Neck: No JVD Cardiac: RRR, no murmurs, rubs, or gallops.  Respiratory: Clear to auscultation bilaterally though diminished Rt base.Marland Kitchen GI: Soft, +tender, distended with ascites MS: No to tr lower ext edema; No deformity. Rt knee incision healed,  Neuro:  Nonfocal  Psych: Normal affect   Labs    Chemistry Recent Labs  Lab 04/23/18 2111 04/24/18 0939 04/25/18 0432  NA 138 140 140  K 3.2* 3.3* 3.2*  CL 102  104 104  CO2 25 24 28   GLUCOSE 126* 103* 101*  BUN 8 8 8   CREATININE 0.70 0.55 0.66  CALCIUM 8.6* 8.6* 8.1*  PROT 6.7  --   --   ALBUMIN 2.9*  --   --   AST 47*  --   --   ALT 15  --   --   ALKPHOS 94  --   --   BILITOT 1.9*  --   --   GFRNONAA >60 >60 >60  GFRAA >60 >60 >60  ANIONGAP 11 12 8      Hematology Recent Labs  Lab 04/23/18 2111  WBC 6.7  RBC 4.33  HGB 13.0  HCT 39.6  MCV 91.5  MCH 30.0  MCHC 32.8  RDW 16.0*  PLT 227    Cardiac EnzymesNo results for input(s): TROPONINI in the last 168 hours. No results for input(s): TROPIPOC in the last 168 hours.   BNPNo results for input(s): BNP, PROBNP in the last 168 hours.   DDimer No results for input(s): DDIMER in the last 168 hours.   Radiology    Dg  Chest 1 View  Result Date: 04/24/2018 CLINICAL DATA:  Status post right thoracentesis. EXAM: CHEST  1 VIEW COMPARISON:  04/23/2018 FINDINGS: Significant reduction in right pleural fluid volume with underlying airspace disease of the right lower lung representing either pneumonia or residual atelectasis. No pneumothorax. No pulmonary edema. Stable mild cardiac enlargement. IMPRESSION: No pneumothorax with significant reduction in right pleural fluid volume after thoracentesis. Underlying pneumonia versus atelectasis of the right lower lung. Electronically Signed   By: Aletta Edouard M.D.   On: 04/24/2018 09:13   Dg Chest 2 View  Result Date: 04/23/2018 CLINICAL DATA:  62 year old female with acute onset of cough and right-sided chest pain and shortness of breath. EXAM: CHEST - 2 VIEW COMPARISON:  Chest radiograph dated 01/20/2018 FINDINGS: There is a moderate size right-sided pleural effusion, new compared to the prior radiograph. There is associated compressive atelectasis of the majority of the right lower and right middle lobes. There is a small aerated portion of the lung in the right upper lobe. The left lung is clear. There is no pneumothorax. The right cardiac borders  are silhouetted. No acute osseous pathology. IMPRESSION: Moderate right pleural effusion with compressive atelectasis of the majority of the right lung, new compared to the prior radiograph. Findings may be related to an infectious etiology although underlying mass is not entirely excluded. Clinical correlation and follow-up to resolution recommended. CT may provide additional evaluation if thoracentesis is considered for symptom relief. Electronically Signed   By: Anner Crete M.D.   On: 04/23/2018 21:36   Ct Angio Chest Pe W/cm &/or Wo Cm  Result Date: 04/23/2018 CLINICAL DATA:  62 y/o F; shortness of breath and abdominal pain with bloating. Knee surgery 1 month ago. EXAM: CT ANGIOGRAPHY CHEST CT ABDOMEN AND PELVIS WITH CONTRAST TECHNIQUE: Multidetector CT imaging of the chest was performed using the standard protocol during bolus administration of intravenous contrast. Multiplanar CT image reconstructions and MIPs were obtained to evaluate the vascular anatomy. Multidetector CT imaging of the abdomen and pelvis was performed using the standard protocol during bolus administration of intravenous contrast. CONTRAST:  30m ISOVUE-370 IOPAMIDOL (ISOVUE-370) INJECTION 76% COMPARISON:  06/10/2011 CT abdomen and pelvis. FINDINGS: CTA CHEST FINDINGS Cardiovascular: Satisfactory opacification of pulmonary arteries. Respiratory motion artifact. No pulmonary embolus identified. Mild coronary artery calcification. Normal heart size. No pericardial effusion. Normal caliber thoracic aorta. Mediastinum/Nodes: No enlarged mediastinal, hilar, or axillary lymph nodes. Thyroid gland, trachea, and esophagus demonstrate no significant findings. Lungs/Pleura: Large right pleural effusion with atelectasis of right lower lobe and partial atelectasis of the right upper lobes and middle lobe. Clear left lung. Musculoskeletal: No chest wall abnormality. No acute or significant osseous findings. Review of the MIP images confirms the  above findings. CT ABDOMEN and PELVIS FINDINGS Hepatobiliary: Lobulation of liver contour with enlargement of the left lobe compatible with cirrhosis. Normal gallbladder. No biliary ductal dilatation. Pancreas: Unremarkable. No pancreatic ductal dilatation or surrounding inflammatory changes. Spleen: Normal in size without focal abnormality. Adrenals/Urinary Tract: Adrenal glands are unremarkable. Kidneys are normal, without renal calculi, focal lesion, or hydronephrosis. Bladder is unremarkable. Stomach/Bowel: Stomach is within normal limits. Appendix appears normal. No evidence of bowel wall thickening, distention, or inflammatory changes. Vascular/Lymphatic: No significant vascular findings are present. Enlargement of mesenteric lymph nodes measuring up to 18 x 14 mm (series 13, image 41). Reproductive: Uterus and bilateral adnexa are unremarkable. Other: Large volume of ascites. Musculoskeletal: No fracture is seen. Stable L1 mild anterior compression deformity. Review of the MIP images confirms  the above findings. IMPRESSION: 1. Respiratory motion artifact.  No pulmonary embolus identified. 2. Large right pleural effusion with atelectasis of right lower lobe and partial atelectasis of right upper and middle lobes. 3. Lobulated liver with enlarged left lobe compatible with cirrhosis. 4. Large volume of ascites. 5. Mild enlargement of mesenteric lymph nodes may represent underlying inflammation or be due to venous congestion. Electronically Signed   By: Kristine Garbe M.D.   On: 04/23/2018 22:12   Ct Abdomen Pelvis W Contrast  Result Date: 04/23/2018 CLINICAL DATA:  62 y/o F; shortness of breath and abdominal pain with bloating. Knee surgery 1 month ago. EXAM: CT ANGIOGRAPHY CHEST CT ABDOMEN AND PELVIS WITH CONTRAST TECHNIQUE: Multidetector CT imaging of the chest was performed using the standard protocol during bolus administration of intravenous contrast. Multiplanar CT image reconstructions and  MIPs were obtained to evaluate the vascular anatomy. Multidetector CT imaging of the abdomen and pelvis was performed using the standard protocol during bolus administration of intravenous contrast. CONTRAST:  26m ISOVUE-370 IOPAMIDOL (ISOVUE-370) INJECTION 76% COMPARISON:  06/10/2011 CT abdomen and pelvis. FINDINGS: CTA CHEST FINDINGS Cardiovascular: Satisfactory opacification of pulmonary arteries. Respiratory motion artifact. No pulmonary embolus identified. Mild coronary artery calcification. Normal heart size. No pericardial effusion. Normal caliber thoracic aorta. Mediastinum/Nodes: No enlarged mediastinal, hilar, or axillary lymph nodes. Thyroid gland, trachea, and esophagus demonstrate no significant findings. Lungs/Pleura: Large right pleural effusion with atelectasis of right lower lobe and partial atelectasis of the right upper lobes and middle lobe. Clear left lung. Musculoskeletal: No chest wall abnormality. No acute or significant osseous findings. Review of the MIP images confirms the above findings. CT ABDOMEN and PELVIS FINDINGS Hepatobiliary: Lobulation of liver contour with enlargement of the left lobe compatible with cirrhosis. Normal gallbladder. No biliary ductal dilatation. Pancreas: Unremarkable. No pancreatic ductal dilatation or surrounding inflammatory changes. Spleen: Normal in size without focal abnormality. Adrenals/Urinary Tract: Adrenal glands are unremarkable. Kidneys are normal, without renal calculi, focal lesion, or hydronephrosis. Bladder is unremarkable. Stomach/Bowel: Stomach is within normal limits. Appendix appears normal. No evidence of bowel wall thickening, distention, or inflammatory changes. Vascular/Lymphatic: No significant vascular findings are present. Enlargement of mesenteric lymph nodes measuring up to 18 x 14 mm (series 13, image 41). Reproductive: Uterus and bilateral adnexa are unremarkable. Other: Large volume of ascites. Musculoskeletal: No fracture is seen.  Stable L1 mild anterior compression deformity. Review of the MIP images confirms the above findings. IMPRESSION: 1. Respiratory motion artifact.  No pulmonary embolus identified. 2. Large right pleural effusion with atelectasis of right lower lobe and partial atelectasis of right upper and middle lobes. 3. Lobulated liver with enlarged left lobe compatible with cirrhosis. 4. Large volume of ascites. 5. Mild enlargement of mesenteric lymph nodes may represent underlying inflammation or be due to venous congestion. Electronically Signed   By: LKristine GarbeM.D.   On: 04/23/2018 22:12   [place]orea[place]oracentesis Asp Pleural Space W/img Guide  Result Date: 04/24/2018 INDICATION: History of NASH cirrhosis. Shortness of breath. Right pleural effusion. Request for diagnostic and therapeutic thoracentesis. EXAM: ULTRASOUND GUIDED RIGHT THORACENTESIS MEDICATIONS: 1% Lidocaine = 10 mL COMPLICATIONS: None immediate. PROCEDURE: An ultrasound guided thoracentesis was thoroughly discussed with the patient and questions answered. The benefits, risks, alternatives and complications were also discussed. The patient understands and wishes to proceed with the procedure. Written consent was obtained. Ultrasound was performed to localize and mark an adequate pocket of fluid in the right chest. The area was then prepped and draped in the normal  sterile fashion. 1% Lidocaine was used for local anesthesia. Under ultrasound guidance a 6 Fr Safe-T-Centesis catheter was introduced. Thoracentesis was performed. The catheter was removed and a dressing applied. FINDINGS: A total of approximately 2 liters of cloudy yellow fluid was removed. Samples were sent to the laboratory as requested by the clinical team. IMPRESSION: Successful ultrasound guided right thoracentesis yielding 2 liters of pleural fluid. No pneumothorax on post procedure chest X-ray. Read by: Gareth Eagle, PA-C Electronically Signed   By: Aletta Edouard M.D.   On:  04/24/2018 12:56    Cardiac Studies   Echo 2013  Study Conclusions  Left ventricle: The cavity size was normal. Wall thickness was increased in a pattern of mild LVH. The estimated ejection fraction was 60%. Wall motion was normal; there were no regional wall motion abnormalities.  Patient Profile     62 y.o. female with a hx of HTN, NASH diagnosed in 2000, knee replacement about a month ago and now admitted with SOB.   Large Rt pl effusion.  On admit required rescue BIPAP.      Assessment & Plan    SOB/ large Rt pl effusion  04/24/18 had thoracentesis 2L   --neg PE on CTA of chest  --today SOB but may be from ascites.    --echo pending  NASH cirrhosis also with ascites GI has seen, ? Acute decompensation due to recent surgery  On lasix IV BID and spironolactone 100 mg  --for paracentesis for today with albumin infusion post  Rt leg swelling awaiting venous dopplers improved though after lasix   Hypokalemia at 3.2 being replaced and Magnesium at 1.6 will replace post procedure  HTN up some this AM at 149/81 most likely due to SOB at other times 126/63  For questions or updates, please contact [facility] Please consult [email or web address] for contact info under Cardiology/STEMI.      Signed, Cecilie Kicks, NP  04/25/2018, 9:19 AM    I have seen and examined the patient along with Cecilie Kicks, NP .  I have reviewed the chart, notes and new data.  I agree with NP's note.  Key new complaints: breathing is pretty good since thoracentesis, no chest discomfort. Key examination changes: edematous, ascites Key new findings / data: no DVT of LE on Doppler. Preliminary echo review at bedside with normal LV and RV size and systolic function, some signs of mild LV diastolic dysfunction. PAP not determined on images I was able to review, but no indirect signs of PAH. Mild abnormalities of hepatic synthetic function (albumin 2.9, INR 1.3, TBili 1.9).  PLAN: Will review full echo once  completed. Will need to identify a regimen of diuretics (furosemide + spironolactone) to keep fluid from reaccummulating.  So far, low suspicion for cardiac cirrhosis or for portal hypertension-associated PAH.  Sanda Klein, MD, [place] [phone] 04/25/2018, 11:29 AM

## 2018-04-26 ENCOUNTER — Inpatient Hospital Stay (HOSPITAL_COMMUNITY): Payer: 59

## 2018-04-26 ENCOUNTER — Ambulatory Visit: Payer: 59 | Admitting: Family Medicine

## 2018-04-26 LAB — BASIC METABOLIC PANEL
ANION GAP: 7 (ref 5–15)
BUN: 9 mg/dL (ref 6–20)
CHLORIDE: 105 mmol/L (ref 101–111)
CO2: 26 mmol/L (ref 22–32)
CREATININE: 0.51 mg/dL (ref 0.44–1.00)
Calcium: 8.4 mg/dL — ABNORMAL LOW (ref 8.9–10.3)
GFR calc Af Amer: >60 mL/min (ref >60)
GFR calc non Af Amer: >60 mL/min (ref >60)
Glucose, Bld: 101 mg/dL — ABNORMAL HIGH (ref 65–99)
Potassium: 3.6 mmol/L (ref 3.5–5.1)
SODIUM: 138 mmol/L (ref 135–145)

## 2018-04-26 LAB — PH, BODY FLUID: pH, Body Fluid: 7.5

## 2018-04-26 LAB — HEPATITIS B SURFACE ANTIGEN: Hepatitis B Surface Ag: NEGATIVE

## 2018-04-26 LAB — MAGNESIUM: Magnesium: 2 mg/dL (ref 1.7–2.4)

## 2018-04-26 LAB — HEPATITIS B CORE ANTIBODY, TOTAL: Hep B Core Total Ab: NEGATIVE

## 2018-04-26 LAB — HEPATITIS B SURFACE ANTIBODY, QUANTITATIVE: Hepatitis B-Post: 168.6 m[IU]/mL (ref >9.9)

## 2018-04-26 LAB — PATHOLOGIST SMEAR REVIEW

## 2018-04-26 LAB — HEPATITIS A ANTIBODY, TOTAL: Hep A Total Ab: NEGATIVE

## 2018-04-26 MED ORDER — SPIRONOLACTONE 25 MG PO TABS: 100.0000 mg | ORAL_TABLET | Freq: Two times a day (BID) | ORAL | Status: DC

## 2018-04-26 MED ORDER — FUROSEMIDE 10 MG/ML IJ SOLN
40.0000 mg | Freq: Once | INTRAMUSCULAR | Status: AC
  Administered 2018-04-26: 40 mg via INTRAVENOUS
  Filled 2018-04-26: qty 4

## 2018-04-26 MED ORDER — SPIRONOLACTONE 100 MG PO TABS
200.0000 mg | ORAL_TABLET | Freq: Every day | ORAL | Status: DC
  Administered 2018-04-26 – 2018-05-03 (×8): 200 mg via ORAL
  Filled 2018-04-26: qty 2
  Filled 2018-04-26 (×2): qty 8
  Filled 2018-04-26: qty 2
  Filled 2018-04-26 (×5): qty 8

## 2018-04-26 MED ORDER — FUROSEMIDE 10 MG/ML IJ SOLN
80.0000 mg | Freq: Every day | INTRAMUSCULAR | Status: DC
  Administered 2018-04-27 – 2018-04-28 (×2): 80 mg via INTRAVENOUS
  Filled 2018-04-26 (×2): qty 8

## 2018-04-26 MED ORDER — FUROSEMIDE 10 MG/ML IJ SOLN: 40.0000 mg | Freq: Three times a day (TID) | INTRAMUSCULAR | Status: DC

## 2018-04-26 MED ORDER — POTASSIUM CHLORIDE CRYS ER 10 MEQ PO TBCR
40.0000 meq | EXTENDED_RELEASE_TABLET | Freq: Every day | ORAL | Status: DC
  Administered 2018-04-26 – 2018-04-27 (×2): 40 meq via ORAL
  Filled 2018-04-26 (×2): qty 4

## 2018-04-26 MED ORDER — POTASSIUM CHLORIDE CRYS ER 10 MEQ PO TBCR: 40.0000 meq | EXTENDED_RELEASE_TABLET | Freq: Once | ORAL | Status: DC

## 2018-04-26 NOTE — Progress Notes (Addendum)
PROGRESS NOTE    Dawn Moon   BJY:[id number]  DOB: April 01, 1956  DOA: 04/23/2018 PCP: Leamon Arnt, MD   Brief Narrative:  Dawn Moon is a 62 year old female with IBS, paroxysmal SVT, hypertension, [name] who underwent a right knee replacement on 4/4 and was discharged from the hospital on 4/5. She presents for shortness of breath steadily worsening since her surgery.  She had doubled her Lasix to 40 mg daily because her right leg has been swollen as well.  She became suddenly more short of breath and presented to the ER.  CTA was done to rule out PE and showed large right-sided pleural effusion with with atelectasis of the right lower lobe and partial atelectasis of the right mid and upper lobes, lobulated liver with enlarged left lobe  compatible with cirrhosis, large volume ascites and mild enlargement of mesenteric lymph nodes. She was admitted to the stepdown unit and an IR consult for thoracentesis was placed.   Subjective: She had a great deal of urine output last night but it is not being measured.  She feels that she is breathing better and her abdomen is less distended after paracentesis.  At home she has been feeling full and has been unable to finish her meals which continues to be a problem today.  Assessment & Plan:   Principal Problem:   Acute hypoxic respiratory failure  -Secondary to right pleural effusion which is hepatic hydrothorax She states she is feeling much better after the thoracentesis 2 L of fluid - initial results reveal turbid fluid with mesothelial cells, 724 WBCs 40% lymphocytes and 56% monocytes -Fluid is transudate  -  2D echo does not show any heart failure -On exam, she has poor lung sounds again in the right lung field- I have repeated a chest x-ray today which shows that not only has the effusion recurred, but it is more than before the thoracentesis -Repeat thoracentesis are unlikely to help this situation and is not recommended -RN has  started measuring urine output and will reweigh the patient has weight today shows that her weight has gone up -Dietitian to discuss low-sodium diet - increase Lasix to 40 TID from BID and increase Aldactone to 100 BID  Active Problems: Ascites  - secondary to cirrhosis   -Follow I&O's and daily weights -Underwent paracentesis of 2.9L on 5/7 and received 4 doses of 12.5 g albumin -See above in regards to management  Cirrhosis of the liver -She has had nonalcoholic steatohepatitis -Hep C antibody has been checked in the past and is noted to be negative in our computer system. -She is followed w by [facility] but the patient would like another GI opinion-[facility] will obtain records from [facility] (as outpt) as far as what work-up she is already had    Hypokalemia and hypomagnesemia -Replacing-continue to follow  Right leg edema -Occurring in relation to right knee surgery-venous duplex negative-Place teds    Essential hypertension -Continue atenolol- Lasix and Aldactone added    NSVT (nonsustained ventricular tachycardia) -Was started on atenolol by Dr. 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 Wall thickness was   normal. Systolic function was normal. The estimated ejection   fraction was in the range of 60% to 65%. Wall motion was normal;   there were no regional wall motion abnormalities. There was no   evidence of elevated ventricular filling pressure by Doppler   parameters. - Left atrium: The atrium was mildly  dilated.  Antimicrobials:  Anti-infectives (From admission, onward)   Start     Dose/Rate Route Frequency Ordered Stop   04/24/18 2200  vancomycin (VANCOCIN) IVPB 1000 mg/200 mL premix  Status:  Discontinued     1,000 mg 200 mL/hr over 60 Minutes Intravenous Every 24 hours 04/24/18 0256 04/24/18 1444   04/24/18 1100  vancomycin (VANCOCIN) 1,250 mg in sodium chloride 0.9 % 250 mL IVPB  Status:  Discontinued     1,250 mg 166.7 mL/hr over 90 Minutes Intravenous Every 12 hours 04/23/18 2242 04/24/18 0215   04/24/18 0800  ceFEPIme (MAXIPIME) 2 g in sodium chloride 0.9 % 100 mL IVPB  Status:  Discontinued     2 g 200 mL/hr over 30 Minutes Intravenous Every 8 hours 04/23/18 2242 04/24/18 0215   04/24/18 0800  ceFEPIme (MAXIPIME) 2 g in sodium chloride 0.9 % 100 mL IVPB  Status:  Discontinued     2 g 200 mL/hr over 30 Minutes Intravenous Every 8 hours 04/24/18 0256 04/24/18 1444   04/23/18 2340  vancomycin (VANCOCIN) 1000 MG powder  Status:  Discontinued    Note to Pharmacy:  Jaci Carrel   : cabinet override      04/23/18 2340 04/24/18 0246   04/23/18 2245  vancomycin (VANCOCIN) 2,000 mg in sodium chloride 0.9 % 500 mL IVPB     2,000 mg 250 mL/hr over 120 Minutes Intravenous  Once 04/23/18 2238 04/24/18 0239   04/23/18 2245  ceFEPIme (MAXIPIME) 2 g injection  Status:  Discontinued    Note to Pharmacy:  Percell Miller   : cabinet override      04/23/18 2245 04/24/18 0246   04/23/18 2230  ceFEPIme (MAXIPIME) 2 g in sodium chloride 0.9 % 100 mL IVPB     2 g 200 mL/hr over 30 Minutes Intravenous  Once 04/23/18 2229 04/23/18 2330   04/23/18 2230  vancomycin (VANCOCIN) IVPB 1000 mg/200 mL premix  Status:  Discontinued     1,000 mg 200 mL/hr over 60 Minutes Intravenous  Once 04/23/18 2229 04/23/18 2237       Objective: Vitals:   04/26/18 0455 04/26/18 0518 04/26/18 1008 04/26/18 1010  BP: (!) 113/54     Pulse: 86     Resp: 16     Temp: 97.7 F (36.5 C)     TempSrc: Oral     SpO2:  92%  98% 93%  Weight:  99.1 kg (218 lb 7.6 oz)    Height:        Intake/Output Summary (Last 24 hours) at 04/26/2018 1114 Last data filed at 04/26/2018 1110 Gross per 24 hour  Intake 610 ml  Output 1350 ml  Net -740 ml   Filed Weights   04/25/18 0500 04/25/18 1955 04/26/18 0518  Weight: 100.4 kg (221 lb 5.5 oz) 95.4 kg (210 lb 5.1 oz) 99.1 kg (218 lb 7.6 oz)    Examination: General exam: Appears comfortable  HEENT: PERRLA, oral mucosa moist, no sclera icterus or thrush Respiratory system: Clear to auscultation. Respiratory effort normal.  Decreased breath sounds in right lower and mid lung field  Cardiovascular system: S1 & S2 heard, RRR.   Gastrointestinal system: Abdomen soft, non-tender, moderately distended. Normal bowel sound. No organomegaly Central nervous system: Alert and oriented. No focal neurological deficits. Extremities: No cyanosis, clubbing- less edema in right leg today Skin: No rashes or ulcers-incision on the right knee appears to be healing well Psychiatry:  Mood & affect appropriate.     Data Reviewed: I have personally reviewed following labs and imaging studies  CBC: Recent Labs  Lab 04/23/18 2111  WBC 6.7  NEUTROABS 3.7  HGB 13.0  HCT 39.6  MCV 91.5  PLT 416   Basic Metabolic Panel: Recent Labs  Lab 04/23/18 2111 04/24/18 0939 04/25/18 0432 04/26/18 0636  NA 138 140 140 138  K 3.2* 3.3* 3.2* 3.6  CL 102 104 104 105  CO2 25 24 28 26   GLUCOSE 126* 103* 101* 101*  BUN 8 8 8 9   CREATININE 0.70 0.55 0.66 0.51  CALCIUM 8.6* 8.6* 8.1* 8.4*  MG  --  1.6*  --  2.0   GFR: Estimated Creatinine Clearance: 84.5 mL/min (by C-G formula based on SCr of 0.51 mg/dL). Liver Function Tests: Recent Labs  Lab 04/23/18 2111 04/25/18 0432  AST 47*  --   ALT 15  --   ALKPHOS 94  --   BILITOT 1.9*  --   PROT 6.7  --   ALBUMIN 2.9* 2.4*   No results for input(s): LIPASE, AMYLASE in the last 168 hours. No results for input(s): AMMONIA in the last 168  hours. Coagulation Profile: Recent Labs  Lab 04/24/18 1101  INR 1.31   Cardiac Enzymes: No results for input(s): CKTOTAL, CKMB, CKMBINDEX, TROPONINI in the last 168 hours. BNP (last 3 results) No results for input(s): PROBNP in the last 8760 hours. HbA1C: No results for input(s): HGBA1C in the last 72 hours. CBG: No results for input(s): GLUCAP in the last 168 hours. Lipid Profile: No results for input(s): CHOL, HDL, LDLCALC, TRIG, CHOLHDL, LDLDIRECT in the last 72 hours. Thyroid Function Tests: No results for input(s): TSH, T4TOTAL, FREET4, T3FREE, THYROIDAB in the last 72 hours. Anemia Panel: No results for input(s): VITAMINB12, FOLATE, FERRITIN, TIBC, IRON, RETICCTPCT in the last 72 hours. Urine analysis:    Component Value Date/Time   COLORURINE YELLOW 01/10/2012 1844   APPEARANCEUR CLEAR 01/10/2012 1844   LABSPEC 1.010 01/10/2012 1844   PHURINE 7.0 01/10/2012 1844   GLUCOSEU NEGATIVE 01/10/2012 1844   HGBUR NEGATIVE 01/10/2012 1844   BILIRUBINUR NEGATIVE 01/10/2012 [place] 01/10/2012 1844   PROTEINUR NEGATIVE 01/10/2012 1844   UROBILINOGEN 0.2 01/10/2012 1844   NITRITE NEGATIVE 01/10/2012 1844   LEUKOCYTESUR NEGATIVE 01/10/2012 1844   Sepsis Labs: @LABRCNTIP (procalcitonin:4,lacticidven:4) ) Recent Results (from the past 240 hour(s))  Blood Culture (routine x 2)     Status: None (Preliminary result)   Collection Time: 04/23/18 10:35 PM  Result Value Ref Range Status   Specimen Description   Final    BLOOD LEFT ARM Performed at [facility], [address] [place], [place] [address]    Special Requests   Final    BOTTLES DRAWN AEROBIC AND ANAEROBIC Blood Culture adequate volume Performed at [facility], [address] [place], [place] [address]    Culture   Final    NO GROWTH 1 DAY Performed at [facility], [place] [address] [place], [place] [address]    Report Status PENDING  Incomplete  Blood Culture  (routine x  2)     Status: None (Preliminary result)   Collection Time: 04/23/18 10:45 PM  Result Value Ref Range Status   Specimen Description   Final    BLOOD RIGHT FOREARM Performed at [facility], [address] [place], [place] [address]    Special Requests   Final    BOTTLES DRAWN AEROBIC AND ANAEROBIC Blood Culture results may not be optimal due to an inadequate volume of blood received in culture bottles Performed at [facility], [address] [place], [place] [address]    Culture   Final    NO GROWTH 1 DAY Performed at [facility], [place] [address], [place], [place] [address]    Report Status PENDING  Incomplete  Culture, body fluid-bottle     Status: None (Preliminary result)   Collection Time: 04/24/18  8:45 AM  Result Value Ref Range Status   Specimen Description PLEURAL FLUID  Final   Special Requests BOTTLES DRAWN AEROBIC AND ANAEROBIC  Final   Culture   Final    NO GROWTH < 24 HOURS Performed at [facility], [place] [address] [place], [place] [address]    Report Status PENDING  Incomplete  Gram stain     Status: None   Collection Time: 04/24/18  8:45 AM  Result Value Ref Range Status   Specimen Description PLEURAL FLUID  Final   Special Requests NONE  Final   Gram Stain   Final    WBC PRESENT,BOTH PMN AND MONONUCLEAR NO ORGANISMS SEEN CYTOSPIN SMEAR Performed at [facility], [address], [place], [place] [address]    Report Status 04/24/2018 FINAL  Final  MRSA PCR Screening     Status: None   Collection Time: 04/24/18 11:06 AM  Result Value Ref Range Status   MRSA by PCR NEGATIVE NEGATIVE Final    Comment:        The GeneXpert MRSA Assay (FDA approved for NASAL specimens only), is one component of a comprehensive MRSA colonization surveillance program. It is not intended to diagnose MRSA infection nor to guide or monitor treatment for MRSA infections. Performed at [facility], [place]  [address], [place], [place] [address]   Gram stain     Status: None   Collection Time: 04/25/18  1:53 PM  Result Value Ref Range Status   Specimen Description PERITONEAL  Final   Special Requests NONE  Final   Gram Stain   Final    WBC PRESENT, PREDOMINANTLY MONONUCLEAR NO ORGANISMS SEEN Performed at [facility], [address] [place], [place] [address]    Report Status 04/25/2018 FINAL  Final         Radiology Studies: Dg Chest 2 View  Result Date: 04/26/2018 CLINICAL DATA:  Follow-up right pleural effusion EXAM: CHEST - 2 VIEW COMPARISON:  04/24/2018 FINDINGS: There is been significant increase in the degree of right-sided pleural effusion when compare with the prior exam. Cardiac shadow remains enlarged. Left lung remains clear. IMPRESSION: Significant recurrent right-sided pleural effusion greater than that seen on the pre-thoracentesis images. Electronically Signed   By: Inez Catalina M.D.   On: 04/26/2018 11:05   US Paracentesis  Result Date: 04/25/2018 INDICATION: Patient with history of NASH cirrhosis, ascites. Request made for diagnostic and therapeutic paracentesis. EXAM: ULTRASOUND GUIDED DIAGNOSTIC AND THERAPEUTIC PARACENTESIS MEDICATIONS: None COMPLICATIONS: None immediate. PROCEDURE: Informed written consent was obtained from the patient after a discussion of the risks,  benefits and alternatives to treatment. A timeout was performed prior to the initiation of the procedure. Initial ultrasound scanning demonstrates a small to moderate amount of ascites within the right lower abdominal quadrant. The right lower abdomen was prepped and draped in the usual sterile fashion. 1% lidocaine was used for local anesthesia. Following this, a 6 Fr Safe-T-Centesis catheter was introduced. An ultrasound image was saved for documentation purposes. The paracentesis was performed. The catheter was removed and a dressing was applied. The patient tolerated the procedure well without immediate  post procedural complication. FINDINGS: A total of approximately 2.9 liters of hazy, amber fluid was removed. Samples were sent to the laboratory as requested by the clinical team. IMPRESSION: Successful ultrasound-guided diagnostic and therapeutic paracentesis yielding 2.9 liters of peritoneal fluid. Read by: Rowe Robert, PA-C Electronically Signed   By: Lucrezia Europe M.D.   On: 04/25/2018 14:19      Scheduled Meds: . aspirin  81 mg Oral BID  . atenolol  50 mg Oral q morning - 10a  . Eluxadoline  75 mg Oral BID  . enoxaparin (LOVENOX) injection  40 mg Subcutaneous Q24H  . furosemide  40 mg Intravenous BID  . pantoprazole  40 mg Oral q morning - 10a  . potassium chloride  40 mEq Oral Daily  . spironolactone  100 mg Oral Daily   Continuous Infusions:    LOS: 2 days    Time spent in minutes: 35   Debbe Odea, MD [facility] Pager: [email or web address] Password [email or web address] 04/26/2018, 11:14 AM

## 2018-04-26 NOTE — Progress Notes (Addendum)
[facility] Progress Note   Chief Complaint:   Decompensated cirrhosis    SUBJECTIVE:    overall feels okay, a little short of breath this am.    ASSESSMENT AND PLAN:   1. Decompensated cirrhosis following knee surgery, probably NASH but other etiologies being rule out. Admitted with ascites and respiratory failure from large right pleural effusion (probably hepatic hydrothorax) . Echo yesterday - EF 60-65% . Cardiology feels low liklihood of cardiac etiology of volume overload -she is s/p thoracentesis 3 days ago.  She had a thoracentesis done a couple of days again but it has recurred on CXR today and she is having some mild SOB, especially when ambulating. -She is s/p 2.9 liter LVP yesterday, no SBP. Slowly diuresing, negative 1,300 ml balance -lasix has just been increased to 85m IV TID and aldactone 200 mg daily -check bmet in am. Continue I+O, daily weights, low sodium diet -may need repeat thoracentesis at some point -she also may need TIPS at some point -outpatient screening for varices  2. RLE swelling. Doppler negative for DVT    OBJECTIVE:    Vital signs in last 24 hours: Temp:  [97.7 F (36.5 C)-98.3 F (36.8 C)] 97.7 F (36.5 C) (05/07 0455) Pulse Rate:  [79-89] 86 (05/07 0455) Resp:  [16-22] 16 (05/07 0455) BP: (109-142)/(51-68) 113/54 (05/07 0455) SpO2:  [88 %-98 %] 93 % (05/07 1010) Weight:  [210 lb 5.1 oz (95.4 kg)-218 lb 7.6 oz (99.1 kg)] 218 lb 7.6 oz (99.1 kg) (05/07 0518) Last BM Date: 04/25/18 General:   Pleasant white female in NAD EENT:  Normal hearing, non icteric sclera, conjunctive pink.  Heart:  Regular rate and rhythm. Mild RLE edema Pulm:Normal respiratory effort, decreased breath sounds RLL. Abdomen:  Soft, nondistended, nontender.  Normal bowel sounds, no masses felt. No hepatomegaly.    Neurologic:  Alert and  oriented x4;  grossly normal neurologically. Psych:  Pleasant, cooperative.  Normal mood and  affect.   Intake/Output from previous day: 05/06 0701 - 05/07 0700 In: 610 [P.O.:300; IV Piggyback:250] Out: 350 [Urine:350] Intake/Output this shift: Total I/O In: -  Out: 1000 [Urine:1000]  Lab Results: Recent Labs    04/23/18 2111  WBC 6.7  HGB 13.0  HCT 39.6  PLT 227   BMET Recent Labs    04/24/18 0939 04/25/18 0432 04/26/18 0636  NA 140 140 138  K 3.3* 3.2* 3.6  CL 104 104 105  CO2 24 28 26   GLUCOSE 103* 101* 101*  BUN 8 8 9   CREATININE 0.55 0.66 0.51  CALCIUM 8.6* 8.1* 8.4*   LFT Recent Labs    04/23/18 2111 04/25/18 0432  PROT 6.7  --   ALBUMIN 2.9* 2.4*  AST 47*  --   ALT 15  --   ALKPHOS 94  --   BILITOT 1.9*  --    PT/INR Recent Labs    04/24/18 1101  LABPROT 16.1*  INR 1.31   Hepatitis Panel Recent Labs    04/25/18 0432  HEPBSAG Negative    Dg Chest 2 View  Result Date: 04/26/2018 CLINICAL DATA:  Follow-up right pleural effusion EXAM: CHEST - 2 VIEW COMPARISON:  04/24/2018 FINDINGS: There is been significant increase in the degree of right-sided pleural effusion when compare with the prior exam. Cardiac shadow remains enlarged. Left lung remains clear. IMPRESSION: Significant recurrent right-sided pleural effusion greater than that seen on the pre-thoracentesis images. Electronically Signed   By: MInez CatalinaM.D.   On:  04/26/2018 11:05   US Paracentesis  Result Date: 04/25/2018 INDICATION: Patient with history of NASH cirrhosis, ascites. Request made for diagnostic and therapeutic paracentesis. EXAM: ULTRASOUND GUIDED DIAGNOSTIC AND THERAPEUTIC PARACENTESIS MEDICATIONS: None COMPLICATIONS: None immediate. PROCEDURE: Informed written consent was obtained from the patient after a discussion of the risks, benefits and alternatives to treatment. A timeout was performed prior to the initiation of the procedure. Initial ultrasound scanning demonstrates a small to moderate amount of ascites within the right lower abdominal quadrant. The right  lower abdomen was prepped and draped in the usual sterile fashion. 1% lidocaine was used for local anesthesia. Following this, a 6 Fr Safe-T-Centesis catheter was introduced. An ultrasound image was saved for documentation purposes. The paracentesis was performed. The catheter was removed and a dressing was applied. The patient tolerated the procedure well without immediate post procedural complication. FINDINGS: A total of approximately 2.9 liters of hazy, amber fluid was removed. Samples were sent to the laboratory as requested by the clinical team. IMPRESSION: Successful ultrasound-guided diagnostic and therapeutic paracentesis yielding 2.9 liters of peritoneal fluid. Read by: Rowe Robert, PA-C Electronically Signed   By: Lucrezia Europe M.D.   On: 04/25/2018 14:19    Principal Problem:   Acute respiratory failure ([facility]) Active Problems:   Essential hypertension   NSVT (nonsustained ventricular tachycardia) (HCC)   IBS (irritable bowel syndrome)   Cirrhosis of liver with ascites (HCC)   Pleural effusion, right   Leg edema, right   Ascites   Hypoxia   SOB (shortness of breath)     LOS: 2 days   Tye Savoy ,NP 04/26/2018, 11:15 AM  Pager number [phone]   Attending physician's note   I have taken an interval history, reviewed the chart and examined the patient. I agree with the Advanced Practitioner's note, impression and recommendations.  Re accumulated pleural effusion and ascites also appreas slightly worse compared to yesterday Agree with increase in dose of diuretics.  She may have more diuretic effect by doing lasix 39m and 2056maldactone daily instead of splitting lasix dose.  Monitor renal function and electrolytes Low sodium diet Low MELD 12, TIPS only if patient fails diuretics with persistent pleural effusion requiring multiple thoracentesis.  Continue aggressive diuresis if renal function is stable.   K.Damaris Hippo MD [phone]

## 2018-04-26 NOTE — Progress Notes (Addendum)
Progress Note  Patient Name: Dawn Moon Date of Encounter: 04/26/2018  Primary Cardiologist: Kirk Ruths, MD   Subjective   Feeling much improved since she arrived to the hospital. Still with DOE and some SOB at rest. Notes some wheezing this morning. Still with RLE pain. Echo results review with patient. She denies CP or palpitations.  Inpatient Medications    Scheduled Meds: . aspirin  81 mg Oral BID  . atenolol  50 mg Oral q morning - 10a  . Eluxadoline  75 mg Oral BID  . enoxaparin (LOVENOX) injection  40 mg Subcutaneous Q24H  . furosemide  40 mg Intravenous BID  . pantoprazole  40 mg Oral q morning - 10a  . spironolactone  100 mg Oral Daily   Continuous Infusions: . albumin human Stopped (04/26/18 0600)   PRN Meds: acetaminophen **OR** acetaminophen, ondansetron **OR** ondansetron (ZOFRAN) IV, promethazine   Vital Signs    Vitals:   04/25/18 2223 04/25/18 2323 04/26/18 0455 04/26/18 0518  BP: 120/67 (!) 109/51 (!) 113/54   Pulse: 86 84 86   Resp: 18 16 16    Temp: 98 F (36.7 C) 97.9 F (36.6 C) 97.7 F (36.5 C)   TempSrc: Oral Oral Oral   SpO2: 95% 94% 92%   Weight:    218 lb 7.6 oz (99.1 kg)  Height:        Intake/Output Summary (Last 24 hours) at 04/26/2018 0802 Last data filed at 04/26/2018 0600 Gross per 24 hour  Intake 610 ml  Output 350 ml  Net 260 ml   Filed Weights   04/25/18 0500 04/25/18 1955 04/26/18 0518  Weight: 221 lb 5.5 oz (100.4 kg) 210 lb 5.1 oz (95.4 kg) 218 lb 7.6 oz (99.1 kg)    Telemetry    NSR - Personally Reviewed  Physical Exam   GEN: No acute distress. Ambulated from wheelchair to bed during exam with moderate DOE observed. Neck: No JVD, no carotid bruits Cardiac: RRR, no murmurs, rubs, or gallops.  Respiratory: scattered wheezing throughout  GI: NABS, Soft, mild diffuse TTP  MS: no pitting LE edema; No deformity. R knee with well healing incision site Neuro:  Nonfocal, moving all extremities  spontaneously Psych: Normal affect   Labs    Chemistry Recent Labs  Lab 04/23/18 2111 04/24/18 0939 04/25/18 0432 04/26/18 0636  NA 138 140 140 138  K 3.2* 3.3* 3.2* 3.6  CL 102 104 104 105  CO2 25 24 28 26   GLUCOSE 126* 103* 101* 101*  BUN 8 8 8 9   CREATININE 0.70 0.55 0.66 0.51  CALCIUM 8.6* 8.6* 8.1* 8.4*  PROT 6.7  --   --   --   ALBUMIN 2.9*  --  2.4*  --   AST 47*  --   --   --   ALT 15  --   --   --   ALKPHOS 94  --   --   --   BILITOT 1.9*  --   --   --   GFRNONAA >60 >60 >60 >60  GFRAA >60 >60 >60 >60  ANIONGAP 11 12 8 7      Hematology Recent Labs  Lab 04/23/18 2111  WBC 6.7  RBC 4.33  HGB 13.0  HCT 39.6  MCV 91.5  MCH 30.0  MCHC 32.8  RDW 16.0*  PLT 227    Cardiac EnzymesNo results for input(s): TROPONINI in the last 168 hours. No results for input(s): TROPIPOC in the last 168 hours.  BNPNo results for input(s): BNP, PROBNP in the last 168 hours.   DDimer No results for input(s): DDIMER in the last 168 hours.   Radiology    Dg Chest 1 View  Result Date: 04/24/2018 CLINICAL DATA:  Status post right thoracentesis. EXAM: CHEST  1 VIEW COMPARISON:  04/23/2018 FINDINGS: Significant reduction in right pleural fluid volume with underlying airspace disease of the right lower lung representing either pneumonia or residual atelectasis. No pneumothorax. No pulmonary edema. Stable mild cardiac enlargement. IMPRESSION: No pneumothorax with significant reduction in right pleural fluid volume after thoracentesis. Underlying pneumonia versus atelectasis of the right lower lung. Electronically Signed   By: Aletta Edouard M.D.   On: 04/24/2018 09:13   US Paracentesis  Result Date: 04/25/2018 INDICATION: Patient with history of NASH cirrhosis, ascites. Request made for diagnostic and therapeutic paracentesis. EXAM: ULTRASOUND GUIDED DIAGNOSTIC AND THERAPEUTIC PARACENTESIS MEDICATIONS: None COMPLICATIONS: None immediate. PROCEDURE: Informed written consent was  obtained from the patient after a discussion of the risks, benefits and alternatives to treatment. A timeout was performed prior to the initiation of the procedure. Initial ultrasound scanning demonstrates a small to moderate amount of ascites within the right lower abdominal quadrant. The right lower abdomen was prepped and draped in the usual sterile fashion. 1% lidocaine was used for local anesthesia. Following this, a 6 Fr Safe-T-Centesis catheter was introduced. An ultrasound image was saved for documentation purposes. The paracentesis was performed. The catheter was removed and a dressing was applied. The patient tolerated the procedure well without immediate post procedural complication. FINDINGS: A total of approximately 2.9 liters of hazy, amber fluid was removed. Samples were sent to the laboratory as requested by the clinical team. IMPRESSION: Successful ultrasound-guided diagnostic and therapeutic paracentesis yielding 2.9 liters of peritoneal fluid. Read by: Rowe Robert, PA-C Electronically Signed   By: Lucrezia Europe M.D.   On: 04/25/2018 14:19   US Thoracentesis Asp Pleural Space W/img Guide  Result Date: 04/24/2018 INDICATION: History of NASH cirrhosis. Shortness of breath. Right pleural effusion. Request for diagnostic and therapeutic thoracentesis. EXAM: ULTRASOUND GUIDED RIGHT THORACENTESIS MEDICATIONS: 1% Lidocaine = 10 mL COMPLICATIONS: None immediate. PROCEDURE: An ultrasound guided thoracentesis was thoroughly discussed with the patient and questions answered. The benefits, risks, alternatives and complications were also discussed. The patient understands and wishes to proceed with the procedure. Written consent was obtained. Ultrasound was performed to localize and mark an adequate pocket of fluid in the right chest. The area was then prepped and draped in the normal sterile fashion. 1% Lidocaine was used for local anesthesia. Under ultrasound guidance a 6 Fr Safe-T-Centesis catheter was  introduced. Thoracentesis was performed. The catheter was removed and a dressing applied. FINDINGS: A total of approximately 2 liters of cloudy yellow fluid was removed. Samples were sent to the laboratory as requested by the clinical team. IMPRESSION: Successful ultrasound guided right thoracentesis yielding 2 liters of pleural fluid. No pneumothorax on post procedure chest X-ray. Read by: Gareth Eagle, PA-C Electronically Signed   By: Aletta Edouard M.D.   On: 04/24/2018 12:56    Cardiac Studies   Echocardiogram 04/25/18: Study Conclusions  - Left ventricle: The cavity size was normal. Wall thickness was   normal. Systolic function was normal. The estimated ejection   fraction was in the range of 60% to 65%. Wall motion was normal;   there were no regional wall motion abnormalities. There was no   evidence of elevated ventricular filling pressure by Doppler   parameters. - Left atrium:  The atrium was mildly dilated.  Patient Profile     62 y.o. female with PMH of HTN, NSVT, NASH diagnosed in 2000, s/p knee replacement 1 mo ago, who presented with SOB and was found to have a large right pleural effusion and ascites.   Assessment & Plan    1. Acute hypoxic respiratory failure: found to have large right pleural effusion. Underwent thoracentesis with 2L fluid removal consistent with transudative fluid. Echo 5/6 with EF 60-65% and no evidence of diastolic dysfunction, wall motion abnormalities, or valvular abnormalities. Pleural effusion likely 2/2 NASH. Continues to require O2 via [place] - Given normal echo, doubtful there is a cardiac component to her ascites and acute hypoxic respiratory failure.  - Continue management per primary team  2. Ascites: again, most likely 2/2 NASH with acute decompensation in the perioperative setting. Receiving Lasix IV 62m BID and spironolactone. Marginal UOP documented (?accuracy) and weight trend somewhat erratic 221 yesterday am >210 yesterday pm >218lbs today.  Patient underwent paracentesis 5/6 with 3L fluid removal followed by albumin x3 doses.  - Continue management per primary team and GI team  3. HTN: BP mostly stable with intermittent mildly elevated BP's - Continue current regimen  4. Hypokalemia: K 3.6 today - Will replete 40 mEq today  5. S/p R knee replacement with RLE pain: Doppler negative.  - Continue supportive care per primary team     For questions or updates, please contact CAtlanticPlease consult [email or web address] for contact info under Cardiology/STEMI.      Signed, KAbigail Butts PA-C  04/26/2018, 8:02 AM   [phone] I have seen and examined the patient along with KAbigail Butts PA-C.  I have reviewed the chart, notes and new data.  I agree with PA's note.  Key new complaints: feels well, but is frustrated that fluid is reaccummulating on CXR. No dyspnea. Key examination changes: still with evidence of ascites and R pleural effusion despite taps. Key new findings / data: creat 0.5 I do not think the weights are reliable (used bed scale today), but she reports that her preop weight was 217 lb (218 today).  PLAN: Continue diuretics, in recently increased doses. She saw a dietitian for Na restricted diet today. Explained fluid will keep reaccummulating until we achieve a negative fluid balance with our diuretics.  MSanda Klein MD, [place]([phone]/06/2018, 1:52 PM

## 2018-04-26 NOTE — Plan of Care (Addendum)
Nutrition Education Note  RD received consult for pt regarding cirrhosis education.  Pt was given "Low Sodium Nutrition Therapy" handout from the [employer]. Pt was educated today on limiting sodium from the diet. Pt was provided with examples of high sodium foods and provided with names of salt-free alternatives. Pt was also educated on the importance of regular weights and limiting fluid to 1.2 L/day per MD recommendations.   RD discussed why it is important for patient to adhere to diet recommendations, and emphasized the role of fluids, foods to avoid, and importance of weighing self daily. Teach back method used.  Expect great compliance.  Body mass index is 37.5 kg/m. Pt meets criteria for obese based on current BMI.  Current diet order is 2g sodium. Labs and medications reviewed. No further nutrition interventions warranted at this time. RD contact information provided. If additional nutrition issues arise, please re-consult RD.   Mariana Single RD, LDN Clinical Nutrition Pager # [phone]

## 2018-04-27 ENCOUNTER — Encounter: Payer: 59 | Admitting: Physical Therapy

## 2018-04-27 LAB — BASIC METABOLIC PANEL
ANION GAP: 10 (ref 5–15)
BUN: 10 mg/dL (ref 6–20)
CHLORIDE: 104 mmol/L (ref 101–111)
CO2: 27 mmol/L (ref 22–32)
Calcium: 8.8 mg/dL — ABNORMAL LOW (ref 8.9–10.3)
Creatinine, Ser: 0.57 mg/dL (ref 0.44–1.00)
GFR calc Af Amer: >60 mL/min (ref >60)
GFR calc non Af Amer: >60 mL/min (ref >60)
Glucose, Bld: 99 mg/dL (ref 65–99)
POTASSIUM: 4.2 mmol/L (ref 3.5–5.1)
SODIUM: 141 mmol/L (ref 135–145)

## 2018-04-27 LAB — MAGNESIUM: MAGNESIUM: 1.7 mg/dL (ref 1.7–2.4)

## 2018-04-27 LAB — MITOCHONDRIAL ANTIBODIES: Mitochondrial M2 Ab, IgG: <20 Units (ref 0.0–20.0)

## 2018-04-27 LAB — ANTI-SMOOTH MUSCLE ANTIBODY, IGG: F-Actin IgG: 14 Units (ref 0–19)

## 2018-04-27 NOTE — Progress Notes (Signed)
[facility] ROUNDING NOTE   Subjective: Breathing better, feels tired   Objective: Vital signs in last 24 hours: Temp:  [97.5 F (36.4 C)-97.6 F (36.4 C)] 97.5 F (36.4 C) (05/08 1455) Pulse Rate:  [81-84] 81 (05/08 1455) Resp:  [18] 18 (05/08 1455) BP: (112)/(58-59) 112/59 (05/08 1455) SpO2:  [93 %-95 %] 95 % (05/08 1455) FiO2 (%):  [24 %] 24 % (05/08 1332) Weight:  [93.9 kg (207 lb)] 93.9 kg (207 lb) (05/08 0500) Last BM Date: 04/25/18 General: NAD Lungs: decreased breath sounds Heart: s1s2 Abdomen: distended, soft, less tense compared to yesterday, fluid shift+ Ext: mild edema    Intake/Output from previous day: 05/07 0701 - 05/08 0700 In: 940 [P.O.:940] Out: 2101 [Urine:2100; Stool:1] Intake/Output this shift: No intake/output data recorded.   Lab Results: No results for input(s): WBC, HGB, PLT, MCV in the last 72 hours. BMET Recent Labs    04/25/18 0432 04/26/18 0636 04/27/18 0634  NA 140 138 141  K 3.2* 3.6 4.2  CL 104 105 104  CO2 28 26 27   GLUCOSE 101* 101* 99  BUN 8 9 10   CREATININE 0.66 0.51 0.57  CALCIUM 8.1* 8.4* 8.8*   LFT Recent Labs    04/25/18 0432  ALBUMIN 2.4*   PT/INR No results for input(s): INR in the last 72 hours.    Imaging/Other results: Dg Chest 2 View  Result Date: 04/26/2018 CLINICAL DATA:  Follow-up right pleural effusion EXAM: CHEST - 2 VIEW COMPARISON:  04/24/2018 FINDINGS: There is been significant increase in the degree of right-sided pleural effusion when compare with the prior exam. Cardiac shadow remains enlarged. Left lung remains clear. IMPRESSION: Significant recurrent right-sided pleural effusion greater than that seen on the pre-thoracentesis images. Electronically Signed   By: Inez Catalina M.D.   On: 04/26/2018 11:05      Assessment &Plan  20 yr F with NASH cirrhosis decompensated with ascites and R pleural effusion after R TKA S/p thoracentesis and paracentesis consistent with  transudative fluid secondary to portal HTN Better diuresis with Lasix 67m and Aldactone 2073mdaily Renal function and electrolytes stable Follow up CXR tomorrow, I&O and daily weight     K.Raliegh IpeDenzil Magnuson MD [phone] [facility] Eye Cor Incastroenterology

## 2018-04-27 NOTE — Progress Notes (Signed)
PROGRESS NOTE    [name]  OEU:[id number] DOB: 15-Apr-1956 DOA: 04/23/2018 PCP: Leamon Arnt, MD     Brief Narrative:  Dawn Moon is a 62 year old female with IBS, paroxysmal SVT, hypertension, NASH who underwent a right knee replacement on 4/4 and was discharged from the hospital on 4/5. She presents for shortness of breath steadily worsening since her surgery.  She had doubled her Lasix to 40 mg daily because her right leg has been swollen as well.  She became suddenly more short of breath and presented to the ER.  CTA was done to rule out PE and showed large right-sided pleural effusion with with atelectasis of the right lower lobe and partial atelectasis of the right mid and upper lobes, lobulated liver with enlarged left lobe compatible with cirrhosis, large volume ascites and mild enlargement of mesenteric lymph nodes. She was admitted to the stepdown unit and underwent right thoracentesis as well as paracentesis.   Assessment & Plan:   Principal Problem:   Acute respiratory failure (HCC) Active Problems:   Essential hypertension   NSVT (nonsustained ventricular tachycardia) (HCC)   IBS (irritable bowel syndrome)   Cirrhosis of liver with ascites (HCC)   Pleural effusion, right   Leg edema, right   Ascites   Hypoxia   SOB (shortness of breath)   Acute hypoxic respiratory failure  -Secondary to right pleural effusion due to hepatic hydrothorax -S/p right thoracentesis 5/5 with removal of 2 L of fluid, transudative, gram stain negative   -Repeat CXR 5/7 showed significant recurrent right-sided pleural effusion greater than that seen on the pre-thoracentesis images. -Echo EF 60-65% without evidence of diastolic dysfunction, cardiology following  -Continue Lasix to 30m QD and Aldactone to 2038mQD -Wean [place] O2 as tolerated    Cirrhosis secondary to NASH with ascites  -S/p paracentesis 5/7 with removal of 2.9 L, culture negative to date  -GI following   Right  leg edema -Occurring in relation to right knee surgery early April  -Venous duplex negative  Essential hypertension -Continue atenolol  NSVT (nonsustained ventricular tachycardia) -Was started on atenolol by Dr. KlCaryl ComesTelemetry   IBS (irritable bowel syndrome) -Cont Eluxadoline as prescribed by GI  GERD -Continue Protonix   DVT prophylaxis: Lovenox (has been on aspirin BID post-op for DVT ppx) Code Status: Full Family Communication: Family at bedside Disposition Plan: Pending improvement   Consultants:   GI  Cardiology  Procedures:   Right thoracentesis 5/5  Paracentesis 5/7  Antimicrobials:  Anti-infectives (From admission, onward)   Start     Dose/Rate Route Frequency Ordered Stop   04/24/18 2200  vancomycin (VANCOCIN) IVPB 1000 mg/200 mL premix  Status:  Discontinued     1,000 mg 200 mL/hr over 60 Minutes Intravenous Every 24 hours 04/24/18 0256 04/24/18 1444   04/24/18 1100  vancomycin (VANCOCIN) 1,250 mg in sodium chloride 0.9 % 250 mL IVPB  Status:  Discontinued     1,250 mg 166.7 mL/hr over 90 Minutes Intravenous Every 12 hours 04/23/18 2242 04/24/18 0215   04/24/18 0800  ceFEPIme (MAXIPIME) 2 g in sodium chloride 0.9 % 100 mL IVPB  Status:  Discontinued     2 g 200 mL/hr over 30 Minutes Intravenous Every 8 hours 04/23/18 2242 04/24/18 0215   04/24/18 0800  ceFEPIme (MAXIPIME) 2 g in sodium chloride 0.9 % 100 mL IVPB  Status:  Discontinued     2 g 200 mL/hr over 30 Minutes Intravenous Every 8 hours 04/24/18 0256 04/24/18 1444  04/23/18 2340  vancomycin (VANCOCIN) 1000 MG powder  Status:  Discontinued    Note to Pharmacy:  Jaci Carrel   : cabinet override      04/23/18 2340 04/24/18 0246   04/23/18 2245  vancomycin (VANCOCIN) 2,000 mg in sodium chloride 0.9 % 500 mL IVPB     2,000 mg 250 mL/hr over 120 Minutes Intravenous  Once 04/23/18 2238 04/24/18 0239   04/23/18 2245  ceFEPIme (MAXIPIME) 2 g injection  Status:  Discontinued    Note to  Pharmacy:  Percell Miller   : cabinet override      04/23/18 2245 04/24/18 0246   04/23/18 2230  ceFEPIme (MAXIPIME) 2 g in sodium chloride 0.9 % 100 mL IVPB     2 g 200 mL/hr over 30 Minutes Intravenous  Once 04/23/18 2229 04/23/18 2330   04/23/18 2230  vancomycin (VANCOCIN) IVPB 1000 mg/200 mL premix  Status:  Discontinued     1,000 mg 200 mL/hr over 60 Minutes Intravenous  Once 04/23/18 2229 04/23/18 2237       Subjective: No complaints, continues to be short of breath. Has been ambulating which makes her very winded   Objective: Vitals:   04/26/18 1505 04/26/18 2006 04/27/18 0422 04/27/18 0500  BP: 124/72 126/61 (!) 112/58   Pulse: 82 82 84   Resp: 20 18 18    Temp: 98.2 F (36.8 C) 98.4 F (36.9 C) 97.6 F (36.4 C)   TempSrc: Oral Oral Oral   SpO2: 100% 95% 93%   Weight:    93.9 kg (207 lb)  Height:        Intake/Output Summary (Last 24 hours) at 04/27/2018 1333 Last data filed at 04/27/2018 1133 Gross per 24 hour  Intake 460 ml  Output 2902 ml  Net -2442 ml   Filed Weights   04/25/18 1955 04/26/18 0518 04/27/18 0500  Weight: 95.4 kg (210 lb 5.1 oz) 99.1 kg (218 lb 7.6 oz) 93.9 kg (207 lb)    Examination:  General exam: Appears calm and comfortable  Respiratory system: Diminished right lung field. Respiratory effort normal. On [place] O2  Cardiovascular system: S1 & S2 heard, RRR. No JVD, murmurs, rubs, gallops or clicks. No pedal edema. Gastrointestinal system: Abdomen is mildlydistended, soft and nontender. No organomegaly or masses felt. Normal bowel sounds heard. Central nervous system: Alert and oriented. No focal neurological deficits. Extremities: Symmetric 5 x 5 power. Skin: No rashes, lesions or ulcers Psychiatry: Judgement and insight appear normal. Mood & affect appropriate.   Data Reviewed: I have personally reviewed following labs and imaging studies  CBC: Recent Labs  Lab 04/23/18 2111  WBC 6.7  NEUTROABS 3.7  HGB 13.0  HCT 39.6  MCV 91.5  PLT  599   Basic Metabolic Panel: Recent Labs  Lab 04/23/18 2111 04/24/18 0939 04/25/18 0432 04/26/18 0636 04/27/18 0634  NA 138 140 140 138 141  K 3.2* 3.3* 3.2* 3.6 4.2  CL 102 104 104 105 104  CO2 25 24 28 26 27   GLUCOSE 126* 103* 101* 101* 99  BUN 8 8 8 9 10   CREATININE 0.70 0.55 0.66 0.51 0.57  CALCIUM 8.6* 8.6* 8.1* 8.4* 8.8*  MG  --  1.6*  --  2.0 1.7   GFR: Estimated Creatinine Clearance: 82.1 mL/min (by C-G formula based on SCr of 0.57 mg/dL). Liver Function Tests: Recent Labs  Lab 04/23/18 2111 04/25/18 0432  AST 47*  --   ALT 15  --   ALKPHOS 94  --  BILITOT 1.9*  --   PROT 6.7  --   ALBUMIN 2.9* 2.4*   No results for input(s): LIPASE, AMYLASE in the last 168 hours. No results for input(s): AMMONIA in the last 168 hours. Coagulation Profile: Recent Labs  Lab 04/24/18 1101  INR 1.31   Cardiac Enzymes: No results for input(s): CKTOTAL, CKMB, CKMBINDEX, TROPONINI in the last 168 hours. BNP (last 3 results) No results for input(s): PROBNP in the last 8760 hours. HbA1C: No results for input(s): HGBA1C in the last 72 hours. CBG: No results for input(s): GLUCAP in the last 168 hours. Lipid Profile: No results for input(s): CHOL, HDL, LDLCALC, TRIG, CHOLHDL, LDLDIRECT in the last 72 hours. Thyroid Function Tests: No results for input(s): TSH, T4TOTAL, FREET4, T3FREE, THYROIDAB in the last 72 hours. Anemia Panel: No results for input(s): VITAMINB12, FOLATE, FERRITIN, TIBC, IRON, RETICCTPCT in the last 72 hours. Sepsis Labs: Recent Labs  Lab 04/23/18 2112 04/23/18 2306 04/23/18 2324 04/24/18 0939  PROCALCITON  --  <0.10  --   --   LATICACIDVEN 3.15*  --  1.93* 2.1*    Recent Results (from the past 240 hour(s))  Blood Culture (routine x 2)     Status: None (Preliminary result)   Collection Time: 04/23/18 10:35 PM  Result Value Ref Range Status   Specimen Description   Final    BLOOD LEFT ARM Performed at [facility], [address] [place], [place] [address]    Special Requests   Final    BOTTLES DRAWN AEROBIC AND ANAEROBIC Blood Culture adequate volume Performed at [facility], [address] [place], [place] [address]    Culture   Final    NO GROWTH 3 DAYS Performed at [facility], [place] [address], [place], [place] [address]    Report Status PENDING  Incomplete  Blood Culture (routine x 2)     Status: None (Preliminary result)   Collection Time: 04/23/18 10:45 PM  Result Value Ref Range Status   Specimen Description   Final    BLOOD RIGHT FOREARM Performed at [facility], [address] [place], [place] [address]    Special Requests   Final    BOTTLES DRAWN AEROBIC AND ANAEROBIC Blood Culture results may not be optimal due to an inadequate volume of blood received in culture bottles Performed at [facility], [address] [place], [place] [address]    Culture   Final    NO GROWTH 3 DAYS Performed at [facility], [place] [address] [place], [place] [address]    Report Status PENDING  Incomplete  Culture, body fluid-bottle     Status: None (Preliminary result)   Collection Time: 04/24/18  8:45 AM  Result Value Ref Range Status   Specimen Description PLEURAL FLUID  Final   Special Requests BOTTLES DRAWN AEROBIC AND ANAEROBIC  Final   Culture   Final    NO GROWTH 3 DAYS Performed at [facility], [place] [address], [place],  [address]    Report Status PENDING  Incomplete  Gram stain     Status: None   Collection Time: 04/24/18  8:45 AM  Result Value Ref Range Status   Specimen Description PLEURAL FLUID  Final   Special Requests NONE  Final   Gram Stain   Final    WBC PRESENT,BOTH PMN AND MONONUCLEAR NO ORGANISMS SEEN CYTOSPIN SMEAR Performed at [facility], [address], [place], [place] [address]    Report Status 04/24/2018 FINAL  Final  MRSA PCR Screening     Status: None   Collection Time: 04/24/18 11:06 AM  Result Value Ref  Range Status   MRSA by PCR NEGATIVE NEGATIVE Final    Comment:        The GeneXpert MRSA Assay (FDA approved for NASAL specimens only), is one component of a comprehensive MRSA colonization surveillance program. It is not intended to diagnose MRSA infection nor to guide or monitor treatment for MRSA infections. Performed at [facility], [place] [address] [place], [place] [address]   Culture, body fluid-bottle     Status: None (Preliminary result)   Collection Time: 04/25/18  1:53 PM  Result Value Ref Range Status   Specimen Description PERITONEAL  Final   Special Requests NONE  Final   Culture   Final    NO GROWTH 2 DAYS Performed at [facility], [address], [place], [place] [address]    Report Status PENDING  Incomplete  Gram stain     Status: None   Collection Time: 04/25/18  1:53 PM  Result Value Ref Range Status   Specimen Description PERITONEAL  Final   Special Requests NONE  Final   Gram Stain   Final    WBC PRESENT, PREDOMINANTLY MONONUCLEAR NO ORGANISMS SEEN Performed at [facility], [address], [place], [place] [address]    Report Status 04/25/2018 FINAL  Final       Radiology Studies: Dg Chest 2 View  Result Date: 04/26/2018 CLINICAL DATA:  Follow-up right pleural effusion EXAM: CHEST - 2 VIEW COMPARISON:  04/24/2018 FINDINGS: There is been significant increase in the degree of right-sided pleural effusion when compare with the prior exam. Cardiac shadow remains enlarged. Left lung remains clear. IMPRESSION: Significant recurrent right-sided pleural effusion greater than that seen on the pre-thoracentesis images. Electronically Signed   By: Inez Catalina M.D.   On: 04/26/2018 11:05   US Paracentesis  Result Date: 04/25/2018 INDICATION: Patient with history of NASH cirrhosis, ascites. Request made for diagnostic and therapeutic paracentesis. EXAM: ULTRASOUND GUIDED DIAGNOSTIC AND THERAPEUTIC PARACENTESIS MEDICATIONS: None  COMPLICATIONS: None immediate. PROCEDURE: Informed written consent was obtained from the patient after a discussion of the risks, benefits and alternatives to treatment. A timeout was performed prior to the initiation of the procedure. Initial ultrasound scanning demonstrates a small to moderate amount of ascites within the right lower abdominal quadrant. The right lower abdomen was prepped and draped in the usual sterile fashion. 1% lidocaine was used for local anesthesia. Following this, a 6 Fr Safe-T-Centesis catheter was introduced. An ultrasound image was saved for documentation purposes. The paracentesis was performed. The catheter was removed and a dressing was applied. The patient tolerated the procedure well without immediate post procedural complication. FINDINGS: A total of approximately 2.9 liters of hazy, amber fluid was removed. Samples were sent to the laboratory as requested by the clinical team. IMPRESSION: Successful ultrasound-guided diagnostic and therapeutic paracentesis yielding 2.9 liters of peritoneal fluid. Read by: Rowe Robert, PA-C Electronically Signed   By: Lucrezia Europe M.D.   On: 04/25/2018 14:19      Scheduled Meds: . atenolol  50 mg Oral q morning - 10a  . Eluxadoline  75 mg Oral BID  . enoxaparin (LOVENOX) injection  40 mg Subcutaneous Q24H  . furosemide  80 mg Intravenous Daily  . pantoprazole  40 mg Oral q  morning - 10a  . spironolactone  200 mg Oral Daily   Continuous Infusions:   LOS: 3 days    Time spent: 35 minutes   Dessa Phi, DO [facility] [email or web address] Password [email or web address] 04/27/2018, 1:33 PM

## 2018-04-28 ENCOUNTER — Inpatient Hospital Stay (HOSPITAL_COMMUNITY): Payer: 59

## 2018-04-28 LAB — CBC
HEMATOCRIT: 36.9 % (ref 36.0–46.0)
Hemoglobin: 11.7 g/dL — ABNORMAL LOW (ref 12.0–15.0)
MCH: 29.5 pg (ref 26.0–34.0)
MCHC: 31.7 g/dL (ref 30.0–36.0)
MCV: 93.2 fL (ref 78.0–100.0)
PLATELETS: 156 10*3/uL (ref 150–400)
RBC: 3.96 MIL/uL (ref 3.87–5.11)
RDW: 15.9 % — AB (ref 11.5–15.5)
WBC: 4.6 10*3/uL (ref 4.0–10.5)

## 2018-04-28 LAB — BASIC METABOLIC PANEL
Anion gap: 9 (ref 5–15)
BUN: 10 mg/dL (ref 6–20)
CO2: 24 mmol/L (ref 22–32)
CREATININE: 0.55 mg/dL (ref 0.44–1.00)
Calcium: 8.8 mg/dL — ABNORMAL LOW (ref 8.9–10.3)
Chloride: 104 mmol/L (ref 101–111)
GFR calc Af Amer: >60 mL/min (ref >60)
Glucose, Bld: 98 mg/dL (ref 65–99)
Potassium: 4.2 mmol/L (ref 3.5–5.1)
SODIUM: 137 mmol/L (ref 135–145)

## 2018-04-28 LAB — ALPHA-1 ANTITRYPSIN PHENOTYPE: A1 ANTITRYPSIN SER: 150 mg/dL (ref 90–200)

## 2018-04-28 LAB — ANTINUCLEAR ANTIBODIES, IFA: ANTINUCLEAR ANTIBODIES, IFA: NEGATIVE

## 2018-04-28 MED ORDER — TRAMADOL HCL 50 MG PO TABS
50.0000 mg | ORAL_TABLET | Freq: Two times a day (BID) | ORAL | Status: DC | PRN
  Administered 2018-04-28 – 2018-05-02 (×2): 50 mg via ORAL
  Filled 2018-04-28 (×2): qty 1

## 2018-04-28 MED ORDER — OXYCODONE HCL 5 MG PO TABS
5.0000 mg | ORAL_TABLET | Freq: Four times a day (QID) | ORAL | Status: DC | PRN
  Administered 2018-04-28: 5 mg via ORAL
  Filled 2018-04-28: qty 1

## 2018-04-28 MED ORDER — FUROSEMIDE 10 MG/ML IJ SOLN
80.0000 mg | Freq: Two times a day (BID) | INTRAMUSCULAR | Status: DC
  Administered 2018-04-28 – 2018-05-01 (×6): 80 mg via INTRAVENOUS
  Filled 2018-04-28 (×6): qty 8

## 2018-04-28 NOTE — Progress Notes (Addendum)
Progress Note  Patient Name: Dawn Moon Date of Encounter: 04/28/2018  Primary Cardiologist: Kirk Ruths, MD   Subjective   Did not urinate all night. Am urine was dark, is clear after am Lasix and she is breathing better.  Inpatient Medications    Scheduled Meds: . atenolol  50 mg Oral q morning - 10a  . Eluxadoline  75 mg Oral BID  . enoxaparin (LOVENOX) injection  40 mg Subcutaneous Q24H  . furosemide  80 mg Intravenous Daily  . pantoprazole  40 mg Oral q morning - 10a  . spironolactone  200 mg Oral Daily   Continuous Infusions:  PRN Meds: ondansetron **OR** ondansetron (ZOFRAN) IV, oxyCODONE, promethazine, traMADol   Vital Signs    Vitals:   04/27/18 2050 04/28/18 0409 04/28/18 0414 04/28/18 0943  BP: 126/64 123/61    Pulse: 87 84    Resp: (!) 22 (!) 21    Temp: 98.2 F (36.8 C) 97.8 F (36.6 C)    TempSrc: Oral Oral    SpO2: 94% 93%    Weight:   208 lb 8.9 oz (94.6 kg) 205 lb 9.6 oz (93.3 kg)  Height:        Intake/Output Summary (Last 24 hours) at 04/28/2018 1058 Last data filed at 04/28/2018 0943 Gross per 24 hour  Intake 470 ml  Output 3550 ml  Net -3080 ml   Filed Weights   04/27/18 0500 04/28/18 0414 04/28/18 0943  Weight: 207 lb (93.9 kg) 208 lb 8.9 oz (94.6 kg) 205 lb 9.6 oz (93.3 kg)    Telemetry    NSR - Personally Reviewed  Physical Exam   GEN: Moderate SOB at rest observed. Neck: No JVD, no carotid bruits Cardiac: RRR, no murmurs, rubs, or gallops.  Respiratory: few rales L, decreased BS R  GI: NABS, distended, soft  MS: no pitting LE edema; No deformity. R knee with well healing incision site Neuro:  Nonfocal, moving all extremities spontaneously Psych: Normal affect   Labs    Chemistry Recent Labs  Lab 04/23/18 2111  04/25/18 0432 04/26/18 0636 04/27/18 0634 04/28/18 0552  NA 138   < > 140 138 141 137  K 3.2*   < > 3.2* 3.6 4.2 4.2  CL 102   < > 104 105 104 104  CO2 25   < > 28 26 27 24   GLUCOSE 126*   < >  101* 101* 99 98  BUN 8   < > 8 9 10 10   CREATININE 0.70   < > 0.66 0.51 0.57 0.55  CALCIUM 8.6*   < > 8.1* 8.4* 8.8* 8.8*  PROT 6.7  --   --   --   --   --   ALBUMIN 2.9*  --  2.4*  --   --   --   AST 47*  --   --   --   --   --   ALT 15  --   --   --   --   --   ALKPHOS 94  --   --   --   --   --   BILITOT 1.9*  --   --   --   --   --   GFRNONAA >60   < > >60 >60 >60 >60  GFRAA >60   < > >60 >60 >60 >60  ANIONGAP 11   < > 8 7 10 9    < > = values in this interval  not displayed.     Hematology Recent Labs  Lab 04/23/18 2111 04/28/18 0552  WBC 6.7 4.6  RBC 4.33 3.96  HGB 13.0 11.7*  HCT 39.6 36.9  MCV 91.5 93.2  MCH 30.0 29.5  MCHC 32.8 31.7  RDW 16.0* 15.9*  PLT 227 156   BNP No results found for: BNP   Radiology    Dg Chest 2 View  Result Date: 04/26/2018 CLINICAL DATA:  Follow-up right pleural effusion EXAM: CHEST - 2 VIEW COMPARISON:  04/24/2018 FINDINGS: There is been significant increase in the degree of right-sided pleural effusion when compare with the prior exam. Cardiac shadow remains enlarged. Left lung remains clear. IMPRESSION: Significant recurrent right-sided pleural effusion greater than that seen on the pre-thoracentesis images. Electronically Signed   By: Inez Catalina M.D.   On: 04/26/2018 11:05   US Paracentesis  Result Date: 04/25/2018 INDICATION: Patient with history of NASH cirrhosis, ascites. Request made for diagnostic and therapeutic paracentesis. EXAM: ULTRASOUND GUIDED DIAGNOSTIC AND THERAPEUTIC PARACENTESIS MEDICATIONS: None COMPLICATIONS: None immediate. PROCEDURE: Informed written consent was obtained from the patient after a discussion of the risks, benefits and alternatives to treatment. A timeout was performed prior to the initiation of the procedure. Initial ultrasound scanning demonstrates a small to moderate amount of ascites within the right lower abdominal quadrant. The right lower abdomen was prepped and draped in the usual sterile  fashion. 1% lidocaine was used for local anesthesia. Following this, a 6 Fr Safe-T-Centesis catheter was introduced. An ultrasound image was saved for documentation purposes. The paracentesis was performed. The catheter was removed and a dressing was applied. The patient tolerated the procedure well without immediate post procedural complication. FINDINGS: A total of approximately 2.9 liters of hazy, amber fluid was removed. Samples were sent to the laboratory as requested by the clinical team. IMPRESSION: Successful ultrasound-guided diagnostic and therapeutic paracentesis yielding 2.9 liters of peritoneal fluid. Read by: Rowe Robert, PA-C Electronically Signed   By: Lucrezia Europe M.D.   On: 04/25/2018 14:19    Cardiac Studies   Echocardiogram 04/25/18: Study Conclusions  - Left ventricle: The cavity size was normal. Wall thickness was   normal. Systolic function was normal. The estimated ejection   fraction was in the range of 60% to 65%. Wall motion was normal;   there were no regional wall motion abnormalities. There was no   evidence of elevated ventricular filling pressure by Doppler   parameters. - Left atrium: The atrium was mildly dilated.  Patient Profile     62 y.o. female with PMH of HTN, NSVT, NASH diagnosed in 2000, s/p knee replacement 1 mo ago, who presented with SOB and was found to have a large right pleural effusion and ascites.   Assessment & Plan    1. Acute hypoxic respiratory failure: found to have large right pleural effusion. Underwent thoracentesis 05/05 with 2L fluid removal consistent with transudative fluid.  - per GI NP note, for repeat thoracentesis today - repeat CXR performed, results pending -  Echo 5/6 with EF 60-65% and no evidence of diastolic dysfunction, wall motion abnormalities, or valvular abnormalities. - Pleural effusion likely 2/2 NASH. Continues to require O2 via [place] - I/O net neg 4.7 L, wt 205 today - Continue management per primary team  2.  Ascites: - most likely 2/2 NASH with acute decompensation in the perioperative setting -  Receiving Lasix IV 80 mg qd and spironolactone 200 mg qd.  - Patient underwent paracentesis 5/6 with 3L fluid removal followed  by albumin x3 doses.  - Continue management per primary team and GI team  3. HTN: SBP 110-126 last 48 hr - Continue current regimen  4. Hypokalemia: K 4.2 today - no standing Kdur ordered, follow  5. S/p R knee replacement with RLE pain: Doppler negative.  - Continue supportive care per primary team    For questions or updates, please contact [place] Please consult [email or web address] for contact info under Cardiology/STEMI.      Signed, Rosaria Ferries, PA-C  04/28/2018, 10:58 AM   [phone]  I have seen and examined the patient along with Rosaria Ferries, PA-C.  I have reviewed the chart, notes and new data.  I agree with PA's note.  Key new complaints: Dyspnea is much worse than it was yesterday afternoon.  Finds it difficult to eat due to dyspnea. Key examination changes: Absent breath sounds entire right lung, dullness to percussion Key new findings / data: Chest x-ray shows virtually complete whiteout of right thorax due to a large pleural effusion.  Threatening remains excellent at 0.55.  Potassium 4.2  PLAN: She will need therapeutic thoracentesis for symptom relief and we have to enhance diuretics.  Will double the dose of intravenous furosemide.  Already on a hefty dose of spironolactone.  May need to add an intermittently dosed thiazide diuretic  Sanda Klein, MD, [facility] [phone] 04/28/2018, 2:20 PM

## 2018-04-28 NOTE — Progress Notes (Addendum)
     [facility] Progress Note   Chief Complaint:   decompensated cirrhosis    SUBJECTIVE:    more short of breath this am and feels like abdomen a little tighter   ASSESSMENT AND PLAN:   62 yo female with decompensated cirrhosis following knee surgery. She presented with ascites and R pleural effusion. S/p thoracentesis and LVP. Fluid transudate from portal HTN.  -R pleural effusion reaccumulated on cxr a couple of days ago, diuretics increased. For repeat CXR today. She is negative 4,000 ml fluid balance  but I suspect pleural effusion progressing given degree of her SOB. I discussed with Hospitalist Choi who will order repeat thoracentesis. Renal function / electrolytes remains normal. -no focal liver lesions on imaging. Check AFP -eventual varices screening outpatient   OBJECTIVE:     Vital signs in last 24 hours: Temp:  [97.5 F (36.4 C)-98.2 F (36.8 C)] 97.8 F (36.6 C) (05/09 0409) Pulse Rate:  [81-87] 84 (05/09 0409) Resp:  [18-22] 21 (05/09 0409) BP: (112-126)/(59-64) 123/61 (05/09 0409) SpO2:  [93 %-95 %] 93 % (05/09 0409) FiO2 (%):  [24 %] 24 % (05/08 1332) Weight:  [208 lb 8.9 oz (94.6 kg)] 208 lb 8.9 oz (94.6 kg) (05/09 0414) Last BM Date: 04/27/18 General:   Alert, well-developed,white female in NAD EENT:  Normal hearing, non icteric sclera, conjunctive pink.  Heart:  Regular rate and rhythm, no significant lower extremity edema Pulm: labored breathing at rest. Diminished breath sounds right lobe. Left lobe clear. Abdomen:  Soft, mild-mod ditended, nontender.  Normal bowel sounds,Neurologic:  Alert and  oriented x4;  grossly normal neurologically. Psych:  Pleasant, cooperative.  Normal mood and affect.   Intake/Output from previous day: 05/08 0701 - 05/09 0700 In: 470 [P.O.:470] Out: 3051 [Urine:3050; Stool:1] Intake/Output this shift: Total I/O In: 120 [P.O.:120] Out: -   Lab Results: Recent Labs    04/28/18 0552  WBC 4.6  HGB  11.7*  HCT 36.9  PLT 156   BMET Recent Labs    04/26/18 0636 04/27/18 0634 04/28/18 0552  NA 138 141 137  K 3.6 4.2 4.2  CL 105 104 104  CO2 26 27 24   GLUCOSE 101* 99 98  BUN 9 10 10   CREATININE 0.51 0.57 0.55  CALCIUM 8.4* 8.8* 8.8*    Principal Problem:   Acute hypoxemic respiratory failure (HCC) Active Problems:   Essential hypertension   NSVT (nonsustained ventricular tachycardia) (HCC)   IBS (irritable bowel syndrome)   Cirrhosis of liver with ascites (HCC)   Pleural effusion on right   Leg edema, right   Ascites   Hypoxia   SOB (shortness of breath)     LOS: 4 days   Tye Savoy ,NP 04/28/2018, 9:36 AM    Attending physician's note   I have taken an interval history, reviewed the chart and examined the patient. I agree with the Advanced Practitioner's note, impression and recommendations.  Worsening right pleural effusion, plan for thoracentesis today as patient is very symptomatic BUN and creatinine stable, she is diuresing with total negative fluid balance over 4 L Will increase Lasix to 80 mg twice daily and continue Aldactone 200 mg daily If continues to reaccumulate fluid and has hydrothorax requiring multiple thoracentesis, unable to tolerate diuresis with worsening renal function, will need to consider TIPS  K. Denzil Magnuson , MD [phone]

## 2018-04-28 NOTE — Progress Notes (Signed)
Pt c/o mild leg pain, has taken Tylenol in the past for it. Has no prn ordered for discomfort. Paged on call provider to see if patient can have something. Will c/t monitor.

## 2018-04-28 NOTE — Progress Notes (Signed)
Patient left unit to have thoracentesis at 1442.

## 2018-04-28 NOTE — Progress Notes (Addendum)
PROGRESS NOTE    Dawn Moon  OTL:[id number] DOB: September 27, 1956 DOA: 04/23/2018 PCP: Leamon Arnt, MD     Brief Narrative:  Dawn Moon is a 62 year old female with IBS, paroxysmal SVT, hypertension, NASH who underwent a right knee replacement on 4/4 and was discharged from the hospital on 4/5. She presents for shortness of breath steadily worsening since her surgery.  She had doubled her Lasix to 40 mg daily because her right leg has been swollen as well.  She became suddenly more short of breath and presented to the ER.  CTA was done to rule out PE and showed large right-sided pleural effusion with with atelectasis of the right lower lobe and partial atelectasis of the right mid and upper lobes, lobulated liver with enlarged left lobe compatible with cirrhosis, large volume ascites and mild enlargement of mesenteric lymph nodes. She was admitted to the stepdown unit and underwent right thoracentesis 5/5 as well as paracentesis 5/7.   Assessment & Plan:   Principal Problem:   Acute hypoxemic respiratory failure (HCC) Active Problems:   Essential hypertension   NSVT (nonsustained ventricular tachycardia) (HCC)   IBS (irritable bowel syndrome)   Cirrhosis of liver with ascites (HCC)   Pleural effusion on right   Leg edema, right   Ascites   Hypoxia   SOB (shortness of breath)   Acute hypoxic respiratory failure  -Secondary to right pleural effusion due to hepatic hydrothorax -S/p right thoracentesis 5/5 with removal of 2 L of fluid, transudative, gram stain negative   -Repeat CXR 5/7 showed significant recurrent right-sided pleural effusion greater than that seen on the pre-thoracentesis images. -Echo EF 60-65% without evidence of diastolic dysfunction, cardiology following  -Continue Lasix to 33m BID and Aldactone to 2053mQD -CXR 5/9 reviewed independently. Revealed complete opacification of right hemithorax -Thoracentesis ordered today   Cirrhosis secondary to NASH  with ascites  -S/p paracentesis 5/7 with removal of 2.9 L, culture negative to date  -GI following   Right leg edema -Occurring in relation to right knee surgery early April  -Venous duplex negative   Essential hypertension -Continue atenolol  NSVT (nonsustained ventricular tachycardia) -Was started on atenolol by Dr. KlCaryl ComesTelemetry   IBS (irritable bowel syndrome) -Cont Eluxadoline as prescribed by GI  GERD -Continue Protonix   DVT prophylaxis: Lovenox (has been on aspirin BID post-op for DVT ppx) Code Status: Full Family Communication: Family at bedside Disposition Plan: Pending improvement, thoracentesis today    Consultants:   GI  Cardiology  Procedures:   Right thoracentesis 5/5  Paracentesis 5/7  Antimicrobials:  Anti-infectives (From admission, onward)   Start     Dose/Rate Route Frequency Ordered Stop   04/24/18 2200  vancomycin (VANCOCIN) IVPB 1000 mg/200 mL premix  Status:  Discontinued     1,000 mg 200 mL/hr over 60 Minutes Intravenous Every 24 hours 04/24/18 0256 04/24/18 1444   04/24/18 1100  vancomycin (VANCOCIN) 1,250 mg in sodium chloride 0.9 % 250 mL IVPB  Status:  Discontinued     1,250 mg 166.7 mL/hr over 90 Minutes Intravenous Every 12 hours 04/23/18 2242 04/24/18 0215   04/24/18 0800  ceFEPIme (MAXIPIME) 2 g in sodium chloride 0.9 % 100 mL IVPB  Status:  Discontinued     2 g 200 mL/hr over 30 Minutes Intravenous Every 8 hours 04/23/18 2242 04/24/18 0215   04/24/18 0800  ceFEPIme (MAXIPIME) 2 g in sodium chloride 0.9 % 100 mL IVPB  Status:  Discontinued  2 g 200 mL/hr over 30 Minutes Intravenous Every 8 hours 04/24/18 0256 04/24/18 1444   04/23/18 2340  vancomycin (VANCOCIN) 1000 MG powder  Status:  Discontinued    Note to Pharmacy:  Jaci Carrel   : cabinet override      04/23/18 2340 04/24/18 0246   04/23/18 2245  vancomycin (VANCOCIN) 2,000 mg in sodium chloride 0.9 % 500 mL IVPB     2,000 mg 250 mL/hr over 120 Minutes  Intravenous  Once 04/23/18 2238 04/24/18 0239   04/23/18 2245  ceFEPIme (MAXIPIME) 2 g injection  Status:  Discontinued    Note to Pharmacy:  Percell Miller   : cabinet override      04/23/18 2245 04/24/18 0246   04/23/18 2230  ceFEPIme (MAXIPIME) 2 g in sodium chloride 0.9 % 100 mL IVPB     2 g 200 mL/hr over 30 Minutes Intravenous  Once 04/23/18 2229 04/23/18 2330   04/23/18 2230  vancomycin (VANCOCIN) IVPB 1000 mg/200 mL premix  Status:  Discontinued     1,000 mg 200 mL/hr over 60 Minutes Intravenous  Once 04/23/18 2229 04/23/18 2237       Subjective: Has been very short of breath.  Denies any chest pain.  Objective: Vitals:   04/27/18 2050 04/28/18 0409 04/28/18 0414 04/28/18 0943  BP: 126/64 123/61    Pulse: 87 84    Resp: (!) 22 (!) 21    Temp: 98.2 F (36.8 C) 97.8 F (36.6 C)    TempSrc: Oral Oral    SpO2: 94% 93%    Weight:   94.6 kg (208 lb 8.9 oz) 93.3 kg (205 lb 9.6 oz)  Height:        Intake/Output Summary (Last 24 hours) at 04/28/2018 1252 Last data filed at 04/28/2018 1134 Gross per 24 hour  Intake 470 ml  Output 2850 ml  Net -2380 ml   Filed Weights   04/27/18 0500 04/28/18 0414 04/28/18 0943  Weight: 93.9 kg (207 lb) 94.6 kg (208 lb 8.9 oz) 93.3 kg (205 lb 9.6 oz)    Examination: General exam: Appears calm and comfortable  Respiratory system: Diminished breath sounds in the right lung field.  Continues to be on nasal cannula O2. Respiratory effort normal. Cardiovascular system: S1 & S2 heard, RRR. No JVD, murmurs, rubs, gallops or clicks. No pedal edema. Gastrointestinal system: Abdomen is nondistended, soft and nontender. No organomegaly or masses felt. Normal bowel sounds heard. Central nervous system: Alert and oriented. No focal neurological deficits. Extremities: Symmetric 5 x 5 power. Skin: No rashes, lesions or ulcers Psychiatry: Judgement and insight appear normal. Mood & affect appropriate.    Data Reviewed: I have personally reviewed  following labs and imaging studies  CBC: Recent Labs  Lab 04/23/18 2111 04/28/18 0552  WBC 6.7 4.6  NEUTROABS 3.7  --   HGB 13.0 11.7*  HCT 39.6 36.9  MCV 91.5 93.2  PLT 227 641   Basic Metabolic Panel: Recent Labs  Lab 04/24/18 0939 04/25/18 0432 04/26/18 0636 04/27/18 0634 04/28/18 0552  NA 140 140 138 141 137  K 3.3* 3.2* 3.6 4.2 4.2  CL 104 104 105 104 104  CO2 24 28 26 27 24   GLUCOSE 103* 101* 101* 99 98  BUN 8 8 9 10 10   CREATININE 0.55 0.66 0.51 0.57 0.55  CALCIUM 8.6* 8.1* 8.4* 8.8* 8.8*  MG 1.6*  --  2.0 1.7  --    GFR: Estimated Creatinine Clearance: 81.7 mL/min (by C-G formula  based on SCr of 0.55 mg/dL). Liver Function Tests: Recent Labs  Lab 04/23/18 2111 04/25/18 0432  AST 47*  --   ALT 15  --   ALKPHOS 94  --   BILITOT 1.9*  --   PROT 6.7  --   ALBUMIN 2.9* 2.4*   No results for input(s): LIPASE, AMYLASE in the last 168 hours. No results for input(s): AMMONIA in the last 168 hours. Coagulation Profile: Recent Labs  Lab 04/24/18 1101  INR 1.31   Cardiac Enzymes: No results for input(s): CKTOTAL, CKMB, CKMBINDEX, TROPONINI in the last 168 hours. BNP (last 3 results) No results for input(s): PROBNP in the last 8760 hours. HbA1C: No results for input(s): HGBA1C in the last 72 hours. CBG: No results for input(s): GLUCAP in the last 168 hours. Lipid Profile: No results for input(s): CHOL, HDL, LDLCALC, TRIG, CHOLHDL, LDLDIRECT in the last 72 hours. Thyroid Function Tests: No results for input(s): TSH, T4TOTAL, FREET4, T3FREE, THYROIDAB in the last 72 hours. Anemia Panel: No results for input(s): VITAMINB12, FOLATE, FERRITIN, TIBC, IRON, RETICCTPCT in the last 72 hours. Sepsis Labs: Recent Labs  Lab 04/23/18 2112 04/23/18 2306 04/23/18 2324 04/24/18 0939  PROCALCITON  --  <0.10  --   --   LATICACIDVEN 3.15*  --  1.93* 2.1*    Recent Results (from the past 240 hour(s))  Blood Culture (routine x 2)     Status: None (Preliminary  result)   Collection Time: 04/23/18 10:35 PM  Result Value Ref Range Status   Specimen Description   Final    BLOOD LEFT ARM Performed at [facility], [address] [place], [place] [address]    Special Requests   Final    BOTTLES DRAWN AEROBIC AND ANAEROBIC Blood Culture adequate volume Performed at [facility], [address] [place], [place] [address]    Culture   Final    NO GROWTH 3 DAYS Performed at [facility], [place] [address] [place], [place] [address]    Report Status PENDING  Incomplete  Blood Culture (routine x 2)     Status: None (Preliminary result)   Collection Time: 04/23/18 10:45 PM  Result Value Ref Range Status   Specimen Description   Final    BLOOD RIGHT FOREARM Performed at [facility], [address] [place], [place] [address]    Special Requests   Final    BOTTLES DRAWN AEROBIC AND ANAEROBIC Blood Culture results may not be optimal due to an inadequate volume of blood received in culture bottles Performed at [facility], [address] [place], [place] [address]    Culture   Final    NO GROWTH 3 DAYS Performed at [facility], [place] [address], [place], [place] [address]    Report Status PENDING  Incomplete  Culture, body fluid-bottle     Status: None (Preliminary result)   Collection Time: 04/24/18  8:45 AM  Result Value Ref Range Status   Specimen Description PLEURAL FLUID  Final   Special Requests BOTTLES DRAWN AEROBIC AND ANAEROBIC  Final   Culture   Final    NO GROWTH 3 DAYS Performed at [facility], [place] [address], [place], [place] [address]    Report Status PENDING  Incomplete  Gram stain     Status: None   Collection Time: 04/24/18  8:45 AM  Result Value Ref Range Status   Specimen Description  PLEURAL FLUID  Final   Special Requests NONE  Final   Gram Stain   Final    WBC PRESENT,BOTH PMN AND MONONUCLEAR NO ORGANISMS SEEN CYTOSPIN SMEAR Performed at [facility], [address], [place], [place] [address]    Report Status 04/24/2018 FINAL  Final  MRSA PCR Screening     Status: None   Collection Time: 04/24/18 11:06 AM  Result Value Ref Range Status   MRSA by PCR NEGATIVE NEGATIVE Final    Comment:        The GeneXpert MRSA Assay (FDA approved for NASAL specimens only), is one component of a comprehensive MRSA colonization surveillance program. It is not intended to diagnose MRSA infection nor to guide or monitor treatment for MRSA infections. Performed at [facility], [place] [address], [place], [place] [address]   Culture, body fluid-bottle     Status: None (Preliminary result)   Collection Time: 04/25/18  1:53 PM  Result Value Ref Range Status   Specimen Description PERITONEAL  Final   Special Requests NONE  Final   Culture   Final    NO GROWTH 2 DAYS Performed at [facility], [address] [place], [place] [address]    Report Status PENDING  Incomplete  Gram stain     Status: None   Collection Time: 04/25/18  1:53 PM  Result Value Ref Range Status   Specimen Description PERITONEAL  Final   Special Requests NONE  Final   Gram Stain   Final    WBC PRESENT, PREDOMINANTLY MONONUCLEAR NO ORGANISMS SEEN Performed at [facility], [address] [place], [place] [address]    Report Status 04/25/2018 FINAL  Final       Radiology Studies: Dg Chest 2 View  Result Date: 04/28/2018 CLINICAL DATA:  62 year old female with a history of increased shortness of breath, with recurrent pleural effusion and cirrhosis EXAM: CHEST - 2 VIEW COMPARISON:  04/26/2018, 04/24/2018 FINDINGS: Cardiomediastinal silhouette likely unchanged, with the right heart border partially obscured by overlying lung and pleural disease. Increasing opacity of the right chest with complete opacification no residual aeration. Left lung relatively well aerated. IMPRESSION: Increasing right-sided pleural effusion with complete  opacification of the right hemithorax in this patient with known hepatic hydrothorax. If a candidate, referral for TIPS evaluation may be considered. Electronically Signed   By: Corrie Mckusick D.O.   On: 04/28/2018 12:14      Scheduled Meds: . atenolol  50 mg Oral q morning - 10a  . Eluxadoline  75 mg Oral BID  . enoxaparin (LOVENOX) injection  40 mg Subcutaneous Q24H  . furosemide  80 mg Intravenous BID  . pantoprazole  40 mg Oral q morning - 10a  . spironolactone  200 mg Oral Daily   Continuous Infusions:   LOS: 4 days    Time spent: 35 minutes   Dessa Phi, DO [facility] [email or web address] Password [email or web address] 04/28/2018, 12:52 PM

## 2018-04-28 NOTE — Procedures (Signed)
Ultrasound-guided  therapeutic right thoracentesis performed yielding 1.9 liters of turbid, amber fluid. No immediate complications. Follow-up chest x-ray pending.Due to pt coughing/chest discomfort only the above amount of fluid was removed today.

## 2018-04-28 NOTE — Progress Notes (Signed)
Patient's home medication was sent to pharmacy on 04/28/18. Pharmacy will send it up as scheduled dose.

## 2018-04-29 ENCOUNTER — Inpatient Hospital Stay: Payer: 59 | Admitting: Family Medicine

## 2018-04-29 ENCOUNTER — Encounter: Payer: 59 | Admitting: Physical Therapy

## 2018-04-29 DIAGNOSIS — R0602 Shortness of breath: Secondary | ICD-10-CM

## 2018-04-29 LAB — CBC
HCT: 39.8 % (ref 36.0–46.0)
Hemoglobin: 12.5 g/dL (ref 12.0–15.0)
MCH: 29.1 pg (ref 26.0–34.0)
MCHC: 31.4 g/dL (ref 30.0–36.0)
MCV: 92.6 fL (ref 78.0–100.0)
PLATELETS: 150 10*3/uL (ref 150–400)
RBC: 4.3 MIL/uL (ref 3.87–5.11)
RDW: 15.6 % — AB (ref 11.5–15.5)
WBC: 5.4 10*3/uL (ref 4.0–10.5)

## 2018-04-29 LAB — CULTURE, BODY FLUID W GRAM STAIN -BOTTLE: Culture: NO GROWTH

## 2018-04-29 LAB — BASIC METABOLIC PANEL
Anion gap: 11 (ref 5–15)
BUN: 9 mg/dL (ref 6–20)
CALCIUM: 9 mg/dL (ref 8.9–10.3)
CO2: 27 mmol/L (ref 22–32)
CREATININE: 0.69 mg/dL (ref 0.44–1.00)
Chloride: 99 mmol/L — ABNORMAL LOW (ref 101–111)
GFR calc Af Amer: >60 mL/min (ref >60)
Glucose, Bld: 97 mg/dL (ref 65–99)
Potassium: 3.9 mmol/L (ref 3.5–5.1)
Sodium: 137 mmol/L (ref 135–145)

## 2018-04-29 LAB — CULTURE, BLOOD (ROUTINE X 2)
CULTURE: NO GROWTH
Culture: NO GROWTH
Special Requests: ADEQUATE

## 2018-04-29 MED ORDER — ELUXADOLINE 75 MG PO TABS
75.0000 mg | ORAL_TABLET | Freq: Two times a day (BID) | ORAL | Status: DC
  Administered 2018-04-29 – 2018-05-03 (×9): 75 mg via ORAL

## 2018-04-29 NOTE — Evaluation (Signed)
Physical Therapy Evaluation Patient Details Name: Dawn Moon MRN: [id number] DOB: 26-Aug-1956 Today's Date: 04/29/2018   History of Present Illness  61 yo female s/p RTKA 03/24/18 , HH/O NASH admitted 04/23/18 with SOM. found to have right pleural effusion with atelectasis, large volume ascites. S/P thoracentesis x 2  and paracentesis  Clinical Impression  The patient ambulated on RA x 400' with saturation > 95% with drop to 90 briefly. Right knee ROM 10-115 degrees knee flexion. Patient reports improved due to decreased fluid. Pt admitted with above diagnosis. Pt currently with functional limitations due to the deficits listed below (see PT Problem List). Pt will benefit from skilled PT to increase their independence and safety with mobility to allow discharge to the venue listed below.       Follow Up Recommendations Outpatient PT(when able to return)    Equipment Recommendations  None recommended by PT    Recommendations for Other Services       Precautions / Restrictions Precautions Precautions: Fall Restrictions Weight Bearing Restrictions: No      Mobility  Bed Mobility Overal bed mobility: Independent                Transfers Overall transfer level: Independent                  Ambulation/Gait Ambulation/Gait assistance: Supervision Ambulation Distance (Feet): 400 Feet Assistive device: None Gait Pattern/deviations: Step-to pattern;Step-through pattern;Decreased stance time - right;Antalgic;Wide base of support     General Gait Details: slow gait speed  Stairs            Wheelchair Mobility    Modified Rankin (Stroke Patients Only)       Balance                                             Pertinent Vitals/Pain Pain Assessment: Faces Faces Pain Scale: Hurts a little bit Pain Location: right knee Pain Descriptors / Indicators: Sore;Tightness Pain Intervention(s): Monitored during session    Home Living  Family/patient expects to be discharged to:: Private residence Living Arrangements: Spouse/significant other Available Help at Discharge: Family Type of Home: House Home Access: Stairs to enter Entrance Stairs-Rails: Psychiatric nurse of Steps: 3 Home Layout: One level Home Equipment: Environmental consultant - 2 wheels;Cane - single point Additional Comments: has been going to OPPT 3x wk    Prior Function Level of Independence: Independent with assistive device(s)               Hand Dominance        Extremity/Trunk Assessment   Upper Extremity Assessment Upper Extremity Assessment: Overall WFL for tasks assessed    Lower Extremity Assessment Lower Extremity Assessment: RLE deficits/detail RLE Deficits / Details: 10-115 knee flexion    Cervical / Trunk Assessment Cervical / Trunk Assessment: Normal  Communication   Communication: No difficulties  Cognition Arousal/Alertness: Awake/alert Behavior During Therapy: WFL for tasks assessed/performed Overall Cognitive Status: Within Functional Limits for tasks assessed                                        General Comments      Exercises Total Joint Exercises Quad Sets: AROM;Right;20 reps Heel Slides: AROM;Right;20 reps Straight Leg Raises: AROM;Right;20 reps Long Arc Quad: AROM;Right;20 reps Knee  Flexion: AROM;Right;20 reps   Assessment/Plan    PT Assessment Patient needs continued PT services  PT Problem List Decreased strength;Decreased range of motion;Decreased activity tolerance;Decreased mobility       PT Treatment Interventions DME instruction;Therapeutic exercise;Gait training;Functional mobility training;Therapeutic activities;Patient/family education    PT Goals (Current goals can be found in the Care Plan section)  Acute Rehab PT Goals Patient Stated Goal: go home PT Goal Formulation: With patient Time For Goal Achievement: 05/06/18 Potential to Achieve Goals: Good     Frequency Min 3X/week   Barriers to discharge        Co-evaluation               AM-PAC PT "6 Clicks" Daily Activity  Outcome Measure Difficulty turning over in bed (including adjusting bedclothes, sheets and blankets)?: None Difficulty moving from lying on back to sitting on the side of the bed? : None Difficulty sitting down on and standing up from a chair with arms (e.g., wheelchair, bedside commode, etc,.)?: None Help needed moving to and from a bed to chair (including a wheelchair)?: None Help needed walking in hospital room?: A Little Help needed climbing 3-5 steps with a railing? : A Lot 6 Click Score: 21    End of Session   Activity Tolerance: Patient tolerated treatment well Patient left: in bed;with call bell/phone within reach;with family/visitor present Nurse Communication: Mobility status PT Visit Diagnosis: Unsteadiness on feet (R26.81)    Time: 1455-1540 PT Time Calculation (min) (ACUTE ONLY): 45 min   Charges:   PT Evaluation $PT Eval Low Complexity: 1 Low PT Treatments $Gait Training: 8-22 mins $Therapeutic Exercise: 8-22 mins   PT G CodesTresa Endo PT [phone]   Claretha Cooper 04/29/2018, 4:33 PM

## 2018-04-29 NOTE — Progress Notes (Addendum)
[facility] Progress Note   Chief Complaint:   Decompensated cirrhosis  SUBJECTIVE:    feels much better today, breathing much easier   ASSESSMENT AND PLAN:   62 yo F with decompensated cirrhosis (likely NASH)  following knee surgery. Presented with ascites and R pleural effusion. Cardiology evaluated but so far no evidence for cardiac cirrhosis  ANA, AMA, A-1 antitrypsin negative. Viral hepatitis studies negative.  -More SOB yesterday and CXR revealed complete opacification of R hemithorax, s/p 1.9 liter R thoracentesis yesterday. Follow up CXR showed residual mod R effusion. Breathing much easier today  -weight down 10 pounds overnight but need to factor in ~ 2 liters removed ( R paracentesis yesterday). She has 4200 ml urine output yesterday and is overall  neg 8,000 ml fluid balance.  -Continue lasix 80 mg IV BID and aldacton 200 mg daily. Renal function remains normal.   OBJECTIVE:     Vital signs in last 24 hours: Temp:  [98.1 F (36.7 C)-98.6 F (37 C)] 98.5 F (36.9 C) (05/10 0456) Pulse Rate:  [78-85] 78 (05/10 0456) Resp:  [18] 18 (05/10 0456) BP: (104-130)/(56-79) 120/66 (05/10 0456) SpO2:  [97 %-98 %] 97 % (05/10 0456) Weight:  [195 lb 5.2 oz (88.6 kg)] 195 lb 5.2 oz (88.6 kg) (05/10 0617) Last BM Date: 04/28/18 General:   Alert, female in chair in NAD. Looks much better today EENT:  Normal hearing, non icteric sclera, conjunctive pink.  Heart:  Regular rate and rhythm, no lower extremity edema Pulm: Normal respiratory effort. Still some decreased breath sound in RLL but much better than yesterday. A few LLL crackles. . Abdomen:  Soft, nondistended.  Normal bowel sounds, no masses felt. No hepatomegaly.    Neurologic:  Alert and  oriented x4;  grossly normal neurologically. Psych:  Pleasant, cooperative.  Normal mood and affect.   Intake/Output from previous day: 05/09 0701 - 05/10 0700 In: 240 [P.O.:240] Out: 4225  [Urine:4225] Intake/Output this shift: Total I/O In: 240 [P.O.:240] Out: 200 [Urine:200]  Lab Results: Recent Labs    04/28/18 0552 04/29/18 0610  WBC 4.6 5.4  HGB 11.7* 12.5  HCT 36.9 39.8  PLT 156 150   BMET Recent Labs    04/27/18 0634 04/28/18 0552 04/29/18 0610  NA 141 137 137  K 4.2 4.2 3.9  CL 104 104 99*  CO2 27 24 27   GLUCOSE 99 98 97  BUN 10 10 9   CREATININE 0.57 0.55 0.69  CALCIUM 8.8* 8.8* 9.0    Dg Chest 1 View  Result Date: 04/28/2018 CLINICAL DATA:  Status post right thoracentesis. EXAM: CHEST  1 VIEW COMPARISON:  04/28/2018 at 10:12 a.m. FINDINGS: Since the prior exam, there has been a reduction in the right-sided pleural effusion. There is still opacity that extends from the right mid to lower lung obscuring the right heart border and right hemidiaphragm consistent with residual pleural fluid and either pneumonia or atelectasis. No pneumothorax. Left lung remains clear.  No left pleural effusion. IMPRESSION: 1. Significant decrease in right pleural fluid following thoracentesis. No pneumothorax. 2. Moderate residual pleural effusion with associated lung base opacity consistent with atelectasis or pneumonia. Electronically Signed   By: Lajean Manes M.D.   On: 04/28/2018 16:12   Dg Chest 2 View  Result Date: 04/28/2018 CLINICAL DATA:  62 year old female with a history of increased shortness of breath, with recurrent pleural effusion and cirrhosis EXAM: CHEST - 2 VIEW COMPARISON:  04/26/2018, 04/24/2018 FINDINGS: Cardiomediastinal silhouette likely unchanged,  with the right heart border partially obscured by overlying lung and pleural disease. Increasing opacity of the right chest with complete opacification no residual aeration. Left lung relatively well aerated. IMPRESSION: Increasing right-sided pleural effusion with complete opacification of the right hemithorax in this patient with known hepatic hydrothorax. If a candidate, referral for TIPS evaluation may be  considered. Electronically Signed   By: Corrie Mckusick D.O.   On: 04/28/2018 12:14   US Thoracentesis Asp Pleural Space W/img Guide  Result Date: 04/28/2018 INDICATION: NASH cirrhosis, dyspnea, recurrent right pleural effusion. Request made for therapeutic right thoracentesis. EXAM: ULTRASOUND GUIDED THERAPEUTIC RIGHT THORACENTESIS MEDICATIONS: None COMPLICATIONS: None immediate. PROCEDURE: An ultrasound guided thoracentesis was thoroughly discussed with the patient and questions answered. The benefits, risks, alternatives and complications were also discussed. The patient understands and wishes to proceed with the procedure. Written consent was obtained. Ultrasound was performed to localize and mark an adequate pocket of fluid in the right chest. The area was then prepped and draped in the normal sterile fashion. 1% Lidocaine was used for local anesthesia. Under ultrasound guidance a 6 Fr Safe-T-Centesis catheter was introduced. Thoracentesis was performed. The catheter was removed and a dressing applied. FINDINGS: A total of approximately 1.9 liters of turbid, amber fluid was removed. IMPRESSION: Successful ultrasound guided therapeutic right thoracentesis yielding 1.9 liters of pleural fluid. Read by: Rowe Robert, PA-C Electronically Signed   By: Sandi Mariscal M.D.   On: 04/28/2018 15:51    Principal Problem:   Acute hypoxemic respiratory failure (HCC) Active Problems:   Essential hypertension   NSVT (nonsustained ventricular tachycardia) (HCC)   IBS (irritable bowel syndrome)   Cirrhosis of liver with ascites (HCC)   Pleural effusion on right   Leg edema, right   Ascites   Hypoxia   SOB (shortness of breath)    LOS: 5 days   Tye Savoy ,NP 04/29/2018, 10:01 AM    Attending physician's note   I have taken a history, examined the patient and reviewed the chart. I agree with the Advanced Practitioner's note, impression and recommendations. Feeling better after thoracentesis BUN and  creatinines stable, over 4 L urine output is more 4000 and weight down by 10 pounds in the past 24 hours Continue IV lasxi 57m BID and Aldactone 2024mdaily for now Monitor daily BMP Dr MaCollene Maresill be rounding this weekend  K. VeDenzil Magnuson MD [phone]

## 2018-04-29 NOTE — Progress Notes (Addendum)
Progress Note  Patient Name: Dawn Moon Date of Encounter: 04/29/2018  Primary Cardiologist: Kirk Ruths, MD   Subjective   Breathing so much better today, felt like eating. Had some abd cramps and a HA last pm, ok now.  Inpatient Medications    Scheduled Meds: . atenolol  50 mg Oral q morning - 10a  . Eluxadoline  75 mg Oral BID  . enoxaparin (LOVENOX) injection  40 mg Subcutaneous Q24H  . furosemide  80 mg Intravenous BID  . pantoprazole  40 mg Oral q morning - 10a  . spironolactone  200 mg Oral Daily   Continuous Infusions:  PRN Meds: ondansetron **OR** ondansetron (ZOFRAN) IV, oxyCODONE, promethazine, traMADol   Vital Signs    Vitals:   04/28/18 1527 04/28/18 2059 04/29/18 0456 04/29/18 0617  BP: (!) 104/56 125/63 120/66   Pulse:  85 78   Resp:  18 18   Temp:  98.6 F (37 C) 98.5 F (36.9 C)   TempSrc:  Oral Oral   SpO2:  97% 97%   Weight:    195 lb 5.2 oz (88.6 kg)  Height:        Intake/Output Summary (Last 24 hours) at 04/29/2018 0809 Last data filed at 04/29/2018 0617 Gross per 24 hour  Intake 240 ml  Output 4225 ml  Net -3985 ml   Filed Weights   04/28/18 0414 04/28/18 0943 04/29/18 0617  Weight: 208 lb 8.9 oz (94.6 kg) 205 lb 9.6 oz (93.3 kg) 195 lb 5.2 oz (88.6 kg)    Telemetry    SR, occ PACs - Personally Reviewed  Physical Exam   GEN: Moderate SOB at rest observed. Neck: No JVD, no carotid bruits Cardiac: RRR, no murmurs, rubs, or gallops.  Respiratory: few rales L base, decreased BS R  GI: NABS, distended, soft  MS: no pitting LE edema; No deformity. R knee with well healing incision site, scattered ecchymosis Neuro:  Nonfocal, moving all extremities spontaneously Psych: Normal affect   Labs    Chemistry Recent Labs  Lab 04/23/18 2111  04/25/18 0432  04/27/18 0634 04/28/18 0552 04/29/18 0610  NA 138   < > 140   < > 141 137 137  K 3.2*   < > 3.2*   < > 4.2 4.2 3.9  CL 102   < > 104   < > 104 104 99*  CO2 25   <  > 28   < > 27 24 27   GLUCOSE 126*   < > 101*   < > 99 98 97  BUN 8   < > 8   < > 10 10 9   CREATININE 0.70   < > 0.66   < > 0.57 0.55 0.69  CALCIUM 8.6*   < > 8.1*   < > 8.8* 8.8* 9.0  PROT 6.7  --   --   --   --   --   --   ALBUMIN 2.9*  --  2.4*  --   --   --   --   AST 47*  --   --   --   --   --   --   ALT 15  --   --   --   --   --   --   ALKPHOS 94  --   --   --   --   --   --   BILITOT 1.9*  --   --   --   --   --   --  GFRNONAA >60   < > >60   < > >60 >60 >60  GFRAA >60   < > >60   < > >60 >60 >60  ANIONGAP 11   < > 8   < > 10 9 11    < > = values in this interval not displayed.     Hematology Recent Labs  Lab 04/23/18 2111 04/28/18 0552 04/29/18 0610  WBC 6.7 4.6 5.4  RBC 4.33 3.96 4.30  HGB 13.0 11.7* 12.5  HCT 39.6 36.9 39.8  MCV 91.5 93.2 92.6  MCH 30.0 29.5 29.1  MCHC 32.8 31.7 31.4  RDW 16.0* 15.9* 15.6*  PLT 227 156 150    Radiology    Dg Chest 1 View  Result Date: 04/28/2018 CLINICAL DATA:  Status post right thoracentesis. EXAM: CHEST  1 VIEW COMPARISON:  04/28/2018 at 10:12 a.m. FINDINGS: Since the prior exam, there has been a reduction in the right-sided pleural effusion. There is still opacity that extends from the right mid to lower lung obscuring the right heart border and right hemidiaphragm consistent with residual pleural fluid and either pneumonia or atelectasis. No pneumothorax. Left lung remains clear.  No left pleural effusion. IMPRESSION: 1. Significant decrease in right pleural fluid following thoracentesis. No pneumothorax. 2. Moderate residual pleural effusion with associated lung base opacity consistent with atelectasis or pneumonia. Electronically Signed   By: Lajean Manes M.D.   On: 04/28/2018 16:12   Dg Chest 2 View  Result Date: 04/28/2018 CLINICAL DATA:  62 year old female with a history of increased shortness of breath, with recurrent pleural effusion and cirrhosis EXAM: CHEST - 2 VIEW COMPARISON:  04/26/2018, 04/24/2018 FINDINGS:  Cardiomediastinal silhouette likely unchanged, with the right heart border partially obscured by overlying lung and pleural disease. Increasing opacity of the right chest with complete opacification no residual aeration. Left lung relatively well aerated. IMPRESSION: Increasing right-sided pleural effusion with complete opacification of the right hemithorax in this patient with known hepatic hydrothorax. If a candidate, referral for TIPS evaluation may be considered. Electronically Signed   By: Corrie Mckusick D.O.   On: 04/28/2018 12:14   Dg Chest 2 View  Result Date: 04/26/2018 CLINICAL DATA:  Follow-up right pleural effusion EXAM: CHEST - 2 VIEW COMPARISON:  04/24/2018 FINDINGS: There is been significant increase in the degree of right-sided pleural effusion when compare with the prior exam. Cardiac shadow remains enlarged. Left lung remains clear. IMPRESSION: Significant recurrent right-sided pleural effusion greater than that seen on the pre-thoracentesis images. Electronically Signed   By: Inez Catalina M.D.   On: 04/26/2018 11:05   US Paracentesis  Result Date: 04/25/2018 INDICATION: Patient with history of NASH cirrhosis, ascites. Request made for diagnostic and therapeutic paracentesis. EXAM: ULTRASOUND GUIDED DIAGNOSTIC AND THERAPEUTIC PARACENTESIS MEDICATIONS: None COMPLICATIONS: None immediate. PROCEDURE: Informed written consent was obtained from the patient after a discussion of the risks, benefits and alternatives to treatment. A timeout was performed prior to the initiation of the procedure. Initial ultrasound scanning demonstrates a small to moderate amount of ascites within the right lower abdominal quadrant. The right lower abdomen was prepped and draped in the usual sterile fashion. 1% lidocaine was used for local anesthesia. Following this, a 6 Fr Safe-T-Centesis catheter was introduced. An ultrasound image was saved for documentation purposes. The paracentesis was performed. The catheter was  removed and a dressing was applied. The patient tolerated the procedure well without immediate post procedural complication. FINDINGS: A total of approximately 2.9 liters of hazy, amber fluid was  removed. Samples were sent to the laboratory as requested by the clinical team. IMPRESSION: Successful ultrasound-guided diagnostic and therapeutic paracentesis yielding 2.9 liters of peritoneal fluid. Read by: Rowe Robert, PA-C Electronically Signed   By: Lucrezia Europe M.D.   On: 04/25/2018 14:19   US Thoracentesis Asp Pleural Space W/img Guide  Result Date: 04/28/2018 INDICATION: NASH cirrhosis, dyspnea, recurrent right pleural effusion. Request made for therapeutic right thoracentesis. EXAM: ULTRASOUND GUIDED THERAPEUTIC RIGHT THORACENTESIS MEDICATIONS: None COMPLICATIONS: None immediate. PROCEDURE: An ultrasound guided thoracentesis was thoroughly discussed with the patient and questions answered. The benefits, risks, alternatives and complications were also discussed. The patient understands and wishes to proceed with the procedure. Written consent was obtained. Ultrasound was performed to localize and mark an adequate pocket of fluid in the right chest. The area was then prepped and draped in the normal sterile fashion. 1% Lidocaine was used for local anesthesia. Under ultrasound guidance a 6 Fr Safe-T-Centesis catheter was introduced. Thoracentesis was performed. The catheter was removed and a dressing applied. FINDINGS: A total of approximately 1.9 liters of turbid, amber fluid was removed. IMPRESSION: Successful ultrasound guided therapeutic right thoracentesis yielding 1.9 liters of pleural fluid. Read by: Rowe Robert, PA-C Electronically Signed   By: Sandi Mariscal M.D.   On: 04/28/2018 15:51    Cardiac Studies   Echocardiogram 04/25/18: Study Conclusions  - Left ventricle: The cavity size was normal. Wall thickness was   normal. Systolic function was normal. The estimated ejection   fraction was in the  range of 60% to 65%. Wall motion was normal;   there were no regional wall motion abnormalities. There was no   evidence of elevated ventricular filling pressure by Doppler   parameters. - Left atrium: The atrium was mildly dilated.  Patient Profile     62 y.o. female with PMH of HTN, NSVT, NASH diagnosed in 2000, s/p knee replacement 1 mo ago, who presented with SOB and was found to have a large right pleural effusion and ascites.   Assessment & Plan    1. Acute hypoxic respiratory failure: large right pleural effusion, recurrent. - thoracentesis 05/05 with 2L fluid removal, transudative   - thoracentesis 05/09 w/ 1.9 L fluid removed - CXR improved after that -  Echo 5/6 with EF 60-65% and no evidence of diastolic dysfunction, wall motion abnormalities, or valvular abnormalities. - Pleural effusion likely 2/2 NASH. Continues to require O2 via [place] - I/O not accurate, wt 195 today - Continue management per primary team  2. Ascites: - most likely 2/2 NASH with acute decompensation in the perioperative setting -  Receiving Lasix IV 80 mg bid and spironolactone 200 mg qd.  - Patient underwent paracentesis 5/6 with 3L fluid removal followed by albumin x3 doses.  - Continue management per primary team and GI team  3. HTN: SBP 104-122 last 24 hr - Continue current regimen  4. Hypokalemia: K 4.2 today - no standing Kdur ordered, follow  5. S/p R knee replacement with RLE pain: Doppler negative.  - Continue supportive care per primary team    For questions or updates, please contact [place] Please consult [email or web address] for contact info under Cardiology/STEMI.      Signed, Rosaria Ferries, PA-C  04/29/2018, 8:09 AM   [phone]  I have seen and examined the patient along with Rosaria Ferries , PA-C.  I have reviewed the chart, notes and new data.  I agree with PA/NP's note.  Key new complaints: breathing improved Key examination changes: dullness  R lung base. -4L  yesterday did not take into account 1.9L thoracentesis (but also probably underestimated PO intake). Weight down 10 lb in 24 h Key new findings / data: BUN 9, creat 0.69, K 3.9  PLAN: Continue IV diuretics until we see further improvement in pleural effusion.  Sanda Klein, MD, [place] [phone] 04/29/2018, 11:15 AM

## 2018-04-29 NOTE — Progress Notes (Signed)
PROGRESS NOTE    Dawn Moon  RKY:[id number] DOB: 24-Dec-1955 DOA: 04/23/2018 PCP: Leamon Arnt, MD     Brief Narrative:  Dawn Moon is a 62 year old female with IBS, paroxysmal SVT, hypertension, NASH who underwent a right knee replacement on 4/4 and was discharged from the hospital on 4/5. She presents for shortness of breath steadily worsening since her surgery.  She had doubled her Lasix to 40 mg daily because her right leg has been swollen as well.  She became suddenly more short of breath and presented to the ER.  CTA was done to rule out PE and showed large right-sided pleural effusion with with atelectasis of the right lower lobe and partial atelectasis of the right mid and upper lobes, lobulated liver with enlarged left lobe compatible with cirrhosis, large volume ascites and mild enlargement of mesenteric lymph nodes. She was admitted to the stepdown unit and underwent right thoracentesis 5/5 as well as paracentesis 5/7.  Despite diuresis, patient developed recurrent right-sided pleural effusion and underwent second right thoracentesis 5/9.  Assessment & Plan:   Principal Problem:   Acute hypoxemic respiratory failure (HCC) Active Problems:   Essential hypertension   NSVT (nonsustained ventricular tachycardia) (HCC)   IBS (irritable bowel syndrome)   Cirrhosis of liver with ascites (HCC)   Pleural effusion on right   Leg edema, right   Ascites   Hypoxia   SOB (shortness of breath)   Acute hypoxic respiratory failure  -Secondary to right pleural effusion due to hepatic hydrothorax -S/p right thoracentesis 5/5 with removal of 2 L of fluid, transudative, gram stain negative   -Repeat CXR 5/7 showed significant recurrent right-sided pleural effusion greater than that seen on the pre-thoracentesis images. -Echo EF 60-65% without evidence of diastolic dysfunction, cardiology following  -Repeat CXR 5/9 showed increasing right-sided pleural effusion with complete  opacification of the right hemithorax -S/p right thoracentesis 5/9 with removal of 1.9 L of fluid.  Postthoracentesis chest x-ray showed moderate residual pleural effusion on right -Continue Lasix IV 72m BID and Aldactone to 2044mQD -Continues to diurese well  Cirrhosis secondary to NASH with ascites  -S/p paracentesis 5/7 with removal of 2.9 L, culture negative to date  -GI following   Right leg edema -Occurring in relation to right knee surgery early April  -Venous duplex negative   Essential hypertension -Continue atenolol  NSVT (nonsustained ventricular tachycardia) -Was started on atenolol by Dr. KlCaryl ComesTelemetry   IBS (irritable bowel syndrome) -Cont Eluxadoline as prescribed by GI  GERD -Continue Protonix   DVT prophylaxis: Lovenox (has been on aspirin BID post-op for DVT ppx) Code Status: Full Family Communication: Family at bedside Disposition Plan: Pending improvement, continued diuresis    Consultants:   GI  Cardiology  Procedures:   Right thoracentesis 5/5  Paracentesis 5/7  Right thoracentesis 5/9   Antimicrobials:  Anti-infectives (From admission, onward)   Start     Dose/Rate Route Frequency Ordered Stop   04/24/18 2200  vancomycin (VANCOCIN) IVPB 1000 mg/200 mL premix  Status:  Discontinued     1,000 mg 200 mL/hr over 60 Minutes Intravenous Every 24 hours 04/24/18 0256 04/24/18 1444   04/24/18 1100  vancomycin (VANCOCIN) 1,250 mg in sodium chloride 0.9 % 250 mL IVPB  Status:  Discontinued     1,250 mg 166.7 mL/hr over 90 Minutes Intravenous Every 12 hours 04/23/18 2242 04/24/18 0215   04/24/18 0800  ceFEPIme (MAXIPIME) 2 g in sodium chloride 0.9 % 100 mL IVPB  Status:  Discontinued     2 g 200 mL/hr over 30 Minutes Intravenous Every 8 hours 04/23/18 2242 04/24/18 0215   04/24/18 0800  ceFEPIme (MAXIPIME) 2 g in sodium chloride 0.9 % 100 mL IVPB  Status:  Discontinued     2 g 200 mL/hr over 30 Minutes Intravenous Every 8 hours  04/24/18 0256 04/24/18 1444   04/23/18 2340  vancomycin (VANCOCIN) 1000 MG powder  Status:  Discontinued    Note to Pharmacy:  Jaci Carrel   : cabinet override      04/23/18 2340 04/24/18 0246   04/23/18 2245  vancomycin (VANCOCIN) 2,000 mg in sodium chloride 0.9 % 500 mL IVPB     2,000 mg 250 mL/hr over 120 Minutes Intravenous  Once 04/23/18 2238 04/24/18 0239   04/23/18 2245  ceFEPIme (MAXIPIME) 2 g injection  Status:  Discontinued    Note to Pharmacy:  Percell Miller   : cabinet override      04/23/18 2245 04/24/18 0246   04/23/18 2230  ceFEPIme (MAXIPIME) 2 g in sodium chloride 0.9 % 100 mL IVPB     2 g 200 mL/hr over 30 Minutes Intravenous  Once 04/23/18 2229 04/23/18 2330   04/23/18 2230  vancomycin (VANCOCIN) IVPB 1000 mg/200 mL premix  Status:  Discontinued     1,000 mg 200 mL/hr over 60 Minutes Intravenous  Once 04/23/18 2229 04/23/18 2237       Subjective: Feeling much better today.  Denies any new complaints.  Her right knee feels stiff due to lack of physical therapy.  Objective: Vitals:   04/28/18 1527 04/28/18 2059 04/29/18 0456 04/29/18 0617  BP: (!) 104/56 125/63 120/66   Pulse:  85 78   Resp:  18 18   Temp:  98.6 F (37 C) 98.5 F (36.9 C)   TempSrc:  Oral Oral   SpO2:  97% 97%   Weight:    88.6 kg (195 lb 5.2 oz)  Height:        Intake/Output Summary (Last 24 hours) at 04/29/2018 1246 Last data filed at 04/29/2018 0831 Gross per 24 hour  Intake 360 ml  Output 2825 ml  Net -2465 ml   Filed Weights   04/28/18 0414 04/28/18 0943 04/29/18 0617  Weight: 94.6 kg (208 lb 8.9 oz) 93.3 kg (205 lb 9.6 oz) 88.6 kg (195 lb 5.2 oz)     Examination: General exam: Appears calm and comfortable  Respiratory system: Decreased breath sounds right lower base. Respiratory effort normal.  Continues to be on nasal cannula O2, no respiratory distress, no conversational dyspnea Cardiovascular system: S1 & S2 heard, RRR. No JVD, murmurs, rubs, gallops or clicks. No  pedal edema. Gastrointestinal system: Abdomen is nondistended, soft and nontender. No organomegaly or masses felt. Normal bowel sounds heard. Central nervous system: Alert and oriented. No focal neurological deficits. Extremities: Symmetric 5 x 5 power.  Skin: No rashes, lesions or ulcers Psychiatry: Judgement and insight appear normal. Mood & affect appropriate.    Data Reviewed: I have personally reviewed following labs and imaging studies  CBC: Recent Labs  Lab 04/23/18 2111 04/28/18 0552 04/29/18 0610  WBC 6.7 4.6 5.4  NEUTROABS 3.7  --   --   HGB 13.0 11.7* 12.5  HCT 39.6 36.9 39.8  MCV 91.5 93.2 92.6  PLT 227 156 568   Basic Metabolic Panel: Recent Labs  Lab 04/24/18 0939 04/25/18 0432 04/26/18 0636 04/27/18 0634 04/28/18 0552 04/29/18 0610  NA 140 140 138 141 137 137  K 3.3* 3.2* 3.6 4.2 4.2 3.9  CL 104 104 105 104 104 99*  CO2 24 28 26 27 24 27   GLUCOSE 103* 101* 101* 99 98 97  BUN 8 8 9 10 10 9   CREATININE 0.55 0.66 0.51 0.57 0.55 0.69  CALCIUM 8.6* 8.1* 8.4* 8.8* 8.8* 9.0  MG 1.6*  --  2.0 1.7  --   --    GFR: Estimated Creatinine Clearance: 79.6 mL/min (by C-G formula based on SCr of 0.69 mg/dL). Liver Function Tests: Recent Labs  Lab 04/23/18 2111 04/25/18 0432  AST 47*  --   ALT 15  --   ALKPHOS 94  --   BILITOT 1.9*  --   PROT 6.7  --   ALBUMIN 2.9* 2.4*   No results for input(s): LIPASE, AMYLASE in the last 168 hours. No results for input(s): AMMONIA in the last 168 hours. Coagulation Profile: Recent Labs  Lab 04/24/18 1101  INR 1.31   Cardiac Enzymes: No results for input(s): CKTOTAL, CKMB, CKMBINDEX, TROPONINI in the last 168 hours. BNP (last 3 results) No results for input(s): PROBNP in the last 8760 hours. HbA1C: No results for input(s): HGBA1C in the last 72 hours. CBG: No results for input(s): GLUCAP in the last 168 hours. Lipid Profile: No results for input(s): CHOL, HDL, LDLCALC, TRIG, CHOLHDL, LDLDIRECT in the last 72  hours. Thyroid Function Tests: No results for input(s): TSH, T4TOTAL, FREET4, T3FREE, THYROIDAB in the last 72 hours. Anemia Panel: No results for input(s): VITAMINB12, FOLATE, FERRITIN, TIBC, IRON, RETICCTPCT in the last 72 hours. Sepsis Labs: Recent Labs  Lab 04/23/18 2112 04/23/18 2306 04/23/18 2324 04/24/18 0939  PROCALCITON  --  <0.10  --   --   LATICACIDVEN 3.15*  --  1.93* 2.1*    Recent Results (from the past 240 hour(s))  Blood Culture (routine x 2)     Status: None   Collection Time: 04/23/18 10:35 PM  Result Value Ref Range Status   Specimen Description   Final    BLOOD LEFT ARM Performed at [facility], [address] [place], [place] [address]    Special Requests   Final    BOTTLES DRAWN AEROBIC AND ANAEROBIC Blood Culture adequate volume Performed at [facility], [address] [place], [place] [address]    Culture   Final    NO GROWTH 5 DAYS Performed at [facility], [place] [address] [place], [place] [address]    Report Status 04/29/2018 FINAL  Final  Blood Culture (routine x 2)     Status: None   Collection Time: 04/23/18 10:45 PM  Result Value Ref Range Status   Specimen Description   Final    BLOOD RIGHT FOREARM Performed at [facility], [address] [place], [place] [address]    Special Requests   Final    BOTTLES DRAWN AEROBIC AND ANAEROBIC Blood Culture results may not be optimal due to an inadequate volume of blood received in culture bottles Performed at [facility], [address] [place], [place] [address]    Culture   Final    NO GROWTH 5 DAYS Performed at [facility], [place] [address], [place], [place] [address]    Report Status 04/29/2018 FINAL  Final  Culture, body fluid-bottle     Status: None   Collection Time: 04/24/18  8:45 AM  Result Value Ref Range Status   Specimen Description  PLEURAL FLUID  Final   Special Requests BOTTLES DRAWN AEROBIC AND ANAEROBIC  Final    Culture   Final    NO GROWTH 5 DAYS Performed at [facility], [place] [address], [place], [place] [address]    Report Status 04/29/2018 FINAL  Final  Gram stain     Status: None   Collection Time: 04/24/18  8:45 AM  Result Value Ref Range Status   Specimen Description PLEURAL FLUID  Final   Special Requests NONE  Final   Gram Stain   Final    WBC PRESENT,BOTH PMN AND MONONUCLEAR NO ORGANISMS SEEN CYTOSPIN SMEAR Performed at [facility], [address] [place], [place] [address]    Report Status 04/24/2018 FINAL  Final  MRSA PCR Screening     Status: None   Collection Time: 04/24/18 11:06 AM  Result Value Ref Range Status   MRSA by PCR NEGATIVE NEGATIVE Final    Comment:        The GeneXpert MRSA Assay (FDA approved for NASAL specimens only), is one component of a comprehensive MRSA colonization surveillance program. It is not intended to diagnose MRSA infection nor to guide or monitor treatment for MRSA infections. Performed at [facility], [place] [address] [place], [place] [address]   Culture, body fluid-bottle     Status: None (Preliminary result)   Collection Time: 04/25/18  1:53 PM  Result Value Ref Range Status   Specimen Description PERITONEAL  Final   Special Requests NONE  Final   Culture   Final    NO GROWTH 4 DAYS Performed at [facility], [address] [place], [place] [address]    Report Status PENDING  Incomplete  Gram stain     Status: None   Collection Time: 04/25/18  1:53 PM  Result Value Ref Range Status   Specimen Description PERITONEAL  Final   Special Requests NONE  Final   Gram Stain   Final    WBC PRESENT, PREDOMINANTLY MONONUCLEAR NO ORGANISMS SEEN Performed at [facility], [address], [place], [place] [address]    Report Status 04/25/2018 FINAL  Final       Radiology Studies: Dg Chest 1 View  Result Date: 04/28/2018 CLINICAL DATA:  Status post right thoracentesis. EXAM: CHEST  1 VIEW  COMPARISON:  04/28/2018 at 10:12 a.m. FINDINGS: Since the prior exam, there has been a reduction in the right-sided pleural effusion. There is still opacity that extends from the right mid to lower lung obscuring the right heart border and right hemidiaphragm consistent with residual pleural fluid and either pneumonia or atelectasis. No pneumothorax. Left lung remains clear.  No left pleural effusion. IMPRESSION: 1. Significant decrease in right pleural fluid following thoracentesis. No pneumothorax. 2. Moderate residual pleural effusion with associated lung base opacity consistent with atelectasis or pneumonia. Electronically Signed   By: Lajean Manes M.D.   On: 04/28/2018 16:12   Dg Chest 2 View  Result Date: 04/28/2018 CLINICAL DATA:  62 year old female with a history of increased shortness of breath, with recurrent pleural effusion and cirrhosis EXAM: CHEST - 2 VIEW COMPARISON:  04/26/2018, 04/24/2018 FINDINGS: Cardiomediastinal silhouette likely unchanged, with the right heart border partially obscured by overlying lung and pleural disease. Increasing opacity of the right chest with complete opacification no residual aeration. Left lung relatively well aerated. IMPRESSION: Increasing right-sided pleural effusion with complete opacification of the right hemithorax in this patient with known hepatic hydrothorax. If  a candidate, referral for TIPS evaluation may be considered. Electronically Signed   By: Corrie Mckusick D.O.   On: 04/28/2018 12:14   US Thoracentesis Asp Pleural Space W/img Guide  Result Date: 04/28/2018 INDICATION: NASH cirrhosis, dyspnea, recurrent right pleural effusion. Request made for therapeutic right thoracentesis. EXAM: ULTRASOUND GUIDED THERAPEUTIC RIGHT THORACENTESIS MEDICATIONS: None COMPLICATIONS: None immediate. PROCEDURE: An ultrasound guided thoracentesis was thoroughly discussed with the patient and questions answered. The benefits, risks, alternatives and complications were  also discussed. The patient understands and wishes to proceed with the procedure. Written consent was obtained. Ultrasound was performed to localize and mark an adequate pocket of fluid in the right chest. The area was then prepped and draped in the normal sterile fashion. 1% Lidocaine was used for local anesthesia. Under ultrasound guidance a 6 Fr Safe-T-Centesis catheter was introduced. Thoracentesis was performed. The catheter was removed and a dressing applied. FINDINGS: A total of approximately 1.9 liters of turbid, amber fluid was removed. IMPRESSION: Successful ultrasound guided therapeutic right thoracentesis yielding 1.9 liters of pleural fluid. Read by: Rowe Robert, PA-C Electronically Signed   By: Sandi Mariscal M.D.   On: 04/28/2018 15:51      Scheduled Meds: . atenolol  50 mg Oral q morning - 10a  . Eluxadoline  75 mg Oral BID  . enoxaparin (LOVENOX) injection  40 mg Subcutaneous Q24H  . furosemide  80 mg Intravenous BID  . pantoprazole  40 mg Oral q morning - 10a  . spironolactone  200 mg Oral Daily   Continuous Infusions:   LOS: 5 days    Time spent: 25 minutes   Dessa Phi, DO [facility] [email or web address] Password [facility] 04/29/2018, 12:46 PM

## 2018-04-30 ENCOUNTER — Inpatient Hospital Stay (HOSPITAL_COMMUNITY): Payer: 59

## 2018-04-30 LAB — BASIC METABOLIC PANEL
ANION GAP: 10 (ref 5–15)
BUN: 11 mg/dL (ref 6–20)
CALCIUM: 8.8 mg/dL — AB (ref 8.9–10.3)
CO2: 28 mmol/L (ref 22–32)
Chloride: 99 mmol/L — ABNORMAL LOW (ref 101–111)
Creatinine, Ser: 0.75 mg/dL (ref 0.44–1.00)
GLUCOSE: 96 mg/dL (ref 65–99)
Potassium: 3.8 mmol/L (ref 3.5–5.1)
Sodium: 137 mmol/L (ref 135–145)

## 2018-04-30 LAB — CBC
HCT: 37.6 % (ref 36.0–46.0)
Hemoglobin: 11.7 g/dL — ABNORMAL LOW (ref 12.0–15.0)
MCH: 28.3 pg (ref 26.0–34.0)
MCHC: 31.1 g/dL (ref 30.0–36.0)
MCV: 91 fL (ref 78.0–100.0)
Platelets: 154 10*3/uL (ref 150–400)
RBC: 4.13 MIL/uL (ref 3.87–5.11)
RDW: 15.7 % — AB (ref 11.5–15.5)
WBC: 5.2 10*3/uL (ref 4.0–10.5)

## 2018-04-30 LAB — CULTURE, BODY FLUID W GRAM STAIN -BOTTLE: Culture: NO GROWTH

## 2018-04-30 NOTE — Progress Notes (Signed)
Progress Note  Patient Name: Dawn Moon Date of Encounter: 04/30/2018  Primary Cardiologist: Dr. Kirk Ruths  Subjective   Feels somewhat "tired" today but still feels like her breathing is much better.  No chest pain or palpitations.  Inpatient Medications    Scheduled Meds: . atenolol  50 mg Oral q morning - 10a  . Eluxadoline  75 mg Oral BID  . enoxaparin (LOVENOX) injection  40 mg Subcutaneous Q24H  . furosemide  80 mg Intravenous BID  . pantoprazole  40 mg Oral q morning - 10a  . spironolactone  200 mg Oral Daily    PRN Meds: ondansetron **OR** ondansetron (ZOFRAN) IV, oxyCODONE, promethazine, traMADol   Vital Signs    Vitals:   04/29/18 1457 04/29/18 2004 04/30/18 0539 04/30/18 0546  BP: 121/65 (!) 107/57 109/72   Pulse: 78 80 79   Resp: 16 18 12    Temp: 97.7 F (36.5 C) 98.3 F (36.8 C) 98.3 F (36.8 C)   TempSrc: Oral Oral Oral   SpO2: 99% 96% 98%   Weight:    194 lb (88 kg)  Height:        Intake/Output Summary (Last 24 hours) at 04/30/2018 1018 Last data filed at 04/30/2018 0549 Gross per 24 hour  Intake 120 ml  Output 1750 ml  Net -1630 ml   Filed Weights   04/28/18 0943 04/29/18 0617 04/30/18 0546  Weight: 205 lb 9.6 oz (93.3 kg) 195 lb 5.2 oz (88.6 kg) 194 lb (88 kg)    Telemetry    Sinus rhythm.  Personally reviewed.  ECG    Tracing from 04/23/2018 showed sinus tachycardia with low voltage.  Personally reviewed.  Physical Exam   GEN: No acute distress.   Neck: No JVD. Cardiac: RRR, no murmur, rub, or gallop.  Respiratory: Nonlabored.  Decreased breath sounds both bases.Marland Kitchen GI: Soft, nontender, bowel sounds present. MS:  Right knee with healed surgical incision, scattered ecchymosis.. Neuro:  Nonfocal. Psych: Alert and oriented x 3. Normal affect.  Labs    Chemistry Recent Labs  Lab 04/23/18 2111  04/25/18 0432  04/28/18 0552 04/29/18 0610 04/30/18 0536  NA 138   < > 140   < > 137 137 137  K 3.2*   < > 3.2*   < >  4.2 3.9 3.8  CL 102   < > 104   < > 104 99* 99*  CO2 25   < > 28   < > 24 27 28   GLUCOSE 126*   < > 101*   < > 98 97 96  BUN 8   < > 8   < > 10 9 11   CREATININE 0.70   < > 0.66   < > 0.55 0.69 0.75  CALCIUM 8.6*   < > 8.1*   < > 8.8* 9.0 8.8*  PROT 6.7  --   --   --   --   --   --   ALBUMIN 2.9*  --  2.4*  --   --   --   --   AST 47*  --   --   --   --   --   --   ALT 15  --   --   --   --   --   --   ALKPHOS 94  --   --   --   --   --   --   BILITOT 1.9*  --   --   --   --   --   --  GFRNONAA >60   < > >60   < > >60 >60 >60  GFRAA >60   < > >60   < > >60 >60 >60  ANIONGAP 11   < > 8   < > 9 11 10    < > = values in this interval not displayed.     Hematology Recent Labs  Lab 04/28/18 0552 04/29/18 0610 04/30/18 0536  WBC 4.6 5.4 5.2  RBC 3.96 4.30 4.13  HGB 11.7* 12.5 11.7*  HCT 36.9 39.8 37.6  MCV 93.2 92.6 91.0  MCH 29.5 29.1 28.3  MCHC 31.7 31.4 31.1  RDW 15.9* 15.6* 15.7*  PLT 156 150 154    Radiology    Dg Chest 1 View  Result Date: 04/28/2018 CLINICAL DATA:  Status post right thoracentesis. EXAM: CHEST  1 VIEW COMPARISON:  04/28/2018 at 10:12 a.m. FINDINGS: Since the prior exam, there has been a reduction in the right-sided pleural effusion. There is still opacity that extends from the right mid to lower lung obscuring the right heart border and right hemidiaphragm consistent with residual pleural fluid and either pneumonia or atelectasis. No pneumothorax. Left lung remains clear.  No left pleural effusion. IMPRESSION: 1. Significant decrease in right pleural fluid following thoracentesis. No pneumothorax. 2. Moderate residual pleural effusion with associated lung base opacity consistent with atelectasis or pneumonia. Electronically Signed   By: Lajean Manes M.D.   On: 04/28/2018 16:12   US Thoracentesis Asp Pleural Space W/img Guide  Result Date: 04/28/2018 INDICATION: NASH cirrhosis, dyspnea, recurrent right pleural effusion. Request made for therapeutic right  thoracentesis. EXAM: ULTRASOUND GUIDED THERAPEUTIC RIGHT THORACENTESIS MEDICATIONS: None COMPLICATIONS: None immediate. PROCEDURE: An ultrasound guided thoracentesis was thoroughly discussed with the patient and questions answered. The benefits, risks, alternatives and complications were also discussed. The patient understands and wishes to proceed with the procedure. Written consent was obtained. Ultrasound was performed to localize and mark an adequate pocket of fluid in the right chest. The area was then prepped and draped in the normal sterile fashion. 1% Lidocaine was used for local anesthesia. Under ultrasound guidance a 6 Fr Safe-T-Centesis catheter was introduced. Thoracentesis was performed. The catheter was removed and a dressing applied. FINDINGS: A total of approximately 1.9 liters of turbid, amber fluid was removed. IMPRESSION: Successful ultrasound guided therapeutic right thoracentesis yielding 1.9 liters of pleural fluid. Read by: Rowe Robert, PA-C Electronically Signed   By: Sandi Mariscal M.D.   On: 04/28/2018 15:51    Cardiac Studies   Echocardiogram 04/25/2018: Study Conclusions  - Left ventricle: The cavity size was normal. Wall thickness was   normal. Systolic function was normal. The estimated ejection   fraction was in the range of 60% to 65%. Wall motion was normal;   there were no regional wall motion abnormalities. There was no   evidence of elevated ventricular filling pressure by Doppler   parameters. - Left atrium: The atrium was mildly dilated.  Patient Profile     62 y.o. female with a history of hypertension, NSVT, NASH, right knee replacement within the last month, presenting with large right pleural effusion and ascites.  She is status post thoracentesis and continues on IV diuresis.  Assessment & Plan    1.  Hypoxic respiratory failure in the setting of large right pleural effusion.  She has had improvement following thoracentesis and continues to diuresis with  approximately net of 1600 cc out more than in last 24 hours.  Weight down about 1 pound.  Follow-up  chest x-ray is pending for today.  2.  NASH with ascites.  3.  Status post recent right knee replacement.  She is working with physical therapy.  Follow-up chest x-ray is planned for today.  Would continue with present dose of IV Lasix.  She is also on Aldactone.  Renal function and potassium are normal.  Signed, Rozann Lesches, MD  04/30/2018, 10:18 AM

## 2018-04-30 NOTE — Progress Notes (Signed)
Cross cover [facility] Subjective: Patient seems to be doing today.She has had him 2 L of ascitic fluid removed by paracentesis day before yesterday and 1.9 L of fluid removed by thoracentesis from the right lung yesterday. She denies having any shortness of breath abdominal pain chest pain nausea melena or hematochezia. She feels more fatigued and tired today but denies any active GI issues at this time. Her chest x-ray for today is pending. She seems to be diuresing well on Aldactone and Lasix. As per my conversation with her she was diagnosed with fatty liver over 20 years ago and has follow-up with Dr. Oletta Lamas and subsequent with Dr. Adrian [facility] at [place]. She plans to switch her care to Dr. Diona Fanti after discharge from the hospital.   Objective: Vital signs in last 24 hours: Temp:  [97.7 F (36.5 C)-98.3 F (36.8 C)] 98.3 F (36.8 C) (05/11 0539) Pulse Rate:  [78-80] 79 (05/11 0539) Resp:  [12-18] 12 (05/11 0539) BP: (107-121)/(57-72) 109/72 (05/11 0539) SpO2:  [96 %-99 %] 98 % (05/11 0539) Weight:  [88 kg (194 lb)] 88 kg (194 lb) (05/11 0546) Last BM Date: 04/29/18  Intake/Output from previous day: 05/10 0701 - 05/11 0700 In: 360 [P.O.:360] Out: 1950 [Urine:1950] Intake/Output this shift: No intake/output data recorded.  General appearance: alert, cooperative, fatigued and no distress Resp: CTA except for diminished breath sounds bibasilar bilaterally Cardio: regular rate and rhythm, S1, S2 normal, no murmur, click, rub or gallop GI: soft, non-tender; bowel sounds normal; no masses,  no organomegaly Extremities: surgical incision over the right knee from recent total knee replacement  Lab Results: Recent Labs    04/28/18 0552 04/29/18 0610 04/30/18 0536  WBC 4.6 5.4 5.2  HGB 11.7* 12.5 11.7*  HCT 36.9 39.8 37.6  PLT 156 150 154   BMET Recent Labs    04/28/18 0552 04/29/18 0610 04/30/18 0536  NA 137 137 137  K 4.2 3.9 3.8  CL 104 99* 99*  CO2 24 27 28    GLUCOSE 98 97 96  BUN 10 9 11   CREATININE 0.55 0.69 0.75  CALCIUM 8.8* 9.0 8.8*   Studies/Results: Dg Chest 1 View  Result Date: 04/28/2018 CLINICAL DATA:  Status post right thoracentesis. EXAM: CHEST  1 VIEW COMPARISON:  04/28/2018 at 10:12 a.m. FINDINGS: Since the prior exam, there has been a reduction in the right-sided pleural effusion. There is still opacity that extends from the right mid to lower lung obscuring the right heart border and right hemidiaphragm consistent with residual pleural fluid and either pneumonia or atelectasis. No pneumothorax. Left lung remains clear.  No left pleural effusion. IMPRESSION: 1. Significant decrease in right pleural fluid following thoracentesis. No pneumothorax. 2. Moderate residual pleural effusion with associated lung base opacity consistent with atelectasis or pneumonia. Electronically Signed   By: Lajean Manes M.D.   On: 04/28/2018 16:12   Dg Chest 2 View  Result Date: 04/28/2018 CLINICAL DATA:  62 year old female with a history of increased shortness of breath, with recurrent pleural effusion and cirrhosis EXAM: CHEST - 2 VIEW COMPARISON:  04/26/2018, 04/24/2018 FINDINGS: Cardiomediastinal silhouette likely unchanged, with the right heart border partially obscured by overlying lung and pleural disease. Increasing opacity of the right chest with complete opacification no residual aeration. Left lung relatively well aerated. IMPRESSION: Increasing right-sided pleural effusion with complete opacification of the right hemithorax in this patient with known hepatic hydrothorax. If a candidate, referral for TIPS evaluation may be considered. Electronically Signed   By: York Cerise  Earleen [place] D.O.   On: 04/28/2018 12:14   US Thoracentesis Asp Pleural Space W/img Guide  Result Date: 04/28/2018 INDICATION: NASH cirrhosis, dyspnea, recurrent right pleural effusion. Request made for therapeutic right thoracentesis. EXAM: ULTRASOUND GUIDED THERAPEUTIC RIGHT THORACENTESIS  MEDICATIONS: None COMPLICATIONS: None immediate. PROCEDURE: An ultrasound guided thoracentesis was thoroughly discussed with the patient and questions answered. The benefits, risks, alternatives and complications were also discussed. The patient understands and wishes to proceed with the procedure. Written consent was obtained. Ultrasound was performed to localize and mark an adequate pocket of fluid in the right chest. The area was then prepped and draped in the normal sterile fashion. 1% Lidocaine was used for local anesthesia. Under ultrasound guidance a 6 Fr Safe-T-Centesis catheter was introduced. Thoracentesis was performed. The catheter was removed and a dressing applied. FINDINGS: A total of approximately 1.9 liters of turbid, amber fluid was removed. IMPRESSION: Successful ultrasound guided therapeutic right thoracentesis yielding 1.9 liters of pleural fluid. Read by: Rowe Robert, PA-C Electronically Signed   By: Sandi Mariscal M.D.   On: 04/28/2018 15:51   Medications: I have reviewed the patient's current medications.  Assessment/Plan: 1) Decompensated NASH cirrhosis with ascites and hydrothorax patient is status post thoracentesis and paracentesis on diuretics with Aldactone and Lasix currently doing well. BUN/creatinine are stable at this time. Continue present care.  2) HTN/NSVT   LOS: 6 days   Davona Kinoshita 04/30/2018, 9:17 AM

## 2018-04-30 NOTE — Progress Notes (Signed)
PROGRESS NOTE    Dawn ANDREASON  Moon:[id number] DOB: December 17, 1956 DOA: 04/23/2018 PCP: Leamon Arnt, MD     Brief Narrative:  Dawn Moon is a 62 year old female with IBS, paroxysmal SVT, hypertension, NASH who underwent a right knee replacement on 4/4 and was discharged from the hospital on 4/5. She presents for shortness of breath steadily worsening since her surgery.  She had doubled her Lasix to 40 mg daily because her right leg has been swollen as well.  She became suddenly more short of breath and presented to the ER.  CTA was done to rule out PE and showed large right-sided pleural effusion with with atelectasis of the right lower lobe and partial atelectasis of the right mid and upper lobes, lobulated liver with enlarged left lobe compatible with cirrhosis, large volume ascites and mild enlargement of mesenteric lymph nodes. She was admitted to the stepdown unit and underwent right thoracentesis 5/5 as well as paracentesis 5/7.  Despite diuresis, patient developed recurrent right-sided pleural effusion and underwent second right thoracentesis 5/9.  Assessment & Plan:   Principal Problem:   Acute hypoxemic respiratory failure (HCC) Active Problems:   Essential hypertension   NSVT (nonsustained ventricular tachycardia) (HCC)   IBS (irritable bowel syndrome)   Cirrhosis of liver with ascites (HCC)   Pleural effusion on right   Leg edema, right   Ascites   Hypoxia   SOB (shortness of breath)   Acute hypoxic respiratory failure  -Secondary to right pleural effusion due to hepatic hydrothorax -S/p right thoracentesis 5/5 with removal of 2 L of fluid, transudative, gram stain negative   -Repeat CXR 5/7 showed significant recurrent right-sided pleural effusion greater than that seen on the pre-thoracentesis images. -Echo EF 60-65% without evidence of diastolic dysfunction, cardiology following  -Repeat CXR 5/9 showed increasing right-sided pleural effusion with complete  opacification of the right hemithorax -S/p right thoracentesis 5/9 with removal of 1.9 L of fluid.  Postthoracentesis chest x-ray showed moderate residual pleural effusion on right -Continue Lasix IV 27m BID and Aldactone to 2040mQD -Continues to diurese well -Repeat CXR 5/11 pending final report, it was reviewed independently and she continues to have moderate right sided pleural effusion, not worse than previous exam   Cirrhosis secondary to NASH with ascites  -S/p paracentesis 5/7 with removal of 2.9 L, culture negative to date  -GI following   Right leg edema -Occurring in relation to right knee surgery early April  -Venous duplex negative   Essential hypertension -Continue atenolol  NSVT (nonsustained ventricular tachycardia) -Was started on atenolol by Dr. KlCaryl ComesTelemetry   IBS (irritable bowel syndrome) -Cont Eluxadoline as prescribed by GI  GERD -Continue Protonix   DVT prophylaxis: Lovenox (has been on aspirin BID post-op for DVT ppx) Code Status: Full Family Communication: Family at bedside Disposition Plan: Pending improvement, continued diuresis    Consultants:   GI  Cardiology  Procedures:   Right thoracentesis 5/5  Paracentesis 5/7  Right thoracentesis 5/9   Antimicrobials:  Anti-infectives (From admission, onward)   Start     Dose/Rate Route Frequency Ordered Stop   04/24/18 2200  vancomycin (VANCOCIN) IVPB 1000 mg/200 mL premix  Status:  Discontinued     1,000 mg 200 mL/hr over 60 Minutes Intravenous Every 24 hours 04/24/18 0256 04/24/18 1444   04/24/18 1100  vancomycin (VANCOCIN) 1,250 mg in sodium chloride 0.9 % 250 mL IVPB  Status:  Discontinued     1,250 mg 166.7 mL/hr over 90 Minutes Intravenous  Every 12 hours 04/23/18 2242 04/24/18 0215   04/24/18 0800  ceFEPIme (MAXIPIME) 2 g in sodium chloride 0.9 % 100 mL IVPB  Status:  Discontinued     2 g 200 mL/hr over 30 Minutes Intravenous Every 8 hours 04/23/18 2242 04/24/18 0215    04/24/18 0800  ceFEPIme (MAXIPIME) 2 g in sodium chloride 0.9 % 100 mL IVPB  Status:  Discontinued     2 g 200 mL/hr over 30 Minutes Intravenous Every 8 hours 04/24/18 0256 04/24/18 1444   04/23/18 2340  vancomycin (VANCOCIN) 1000 MG powder  Status:  Discontinued    Note to Pharmacy:  Jaci Carrel   : cabinet override      04/23/18 2340 04/24/18 0246   04/23/18 2245  vancomycin (VANCOCIN) 2,000 mg in sodium chloride 0.9 % 500 mL IVPB     2,000 mg 250 mL/hr over 120 Minutes Intravenous  Once 04/23/18 2238 04/24/18 0239   04/23/18 2245  ceFEPIme (MAXIPIME) 2 g injection  Status:  Discontinued    Note to Pharmacy:  Percell Miller   : cabinet override      04/23/18 2245 04/24/18 0246   04/23/18 2230  ceFEPIme (MAXIPIME) 2 g in sodium chloride 0.9 % 100 mL IVPB     2 g 200 mL/hr over 30 Minutes Intravenous  Once 04/23/18 2229 04/23/18 2330   04/23/18 2230  vancomycin (VANCOCIN) IVPB 1000 mg/200 mL premix  Status:  Discontinued     1,000 mg 200 mL/hr over 60 Minutes Intravenous  Once 04/23/18 2229 04/23/18 2237       Subjective: She was weaned off oxygen intermittently yesterday.  Feeling well overall.  States that she is very fatigued.  Objective: Vitals:   04/29/18 1457 04/29/18 2004 04/30/18 0539 04/30/18 0546  BP: 121/65 (!) 107/57 109/72   Pulse: 78 80 79   Resp: 16 18 12    Temp: 97.7 F (36.5 C) 98.3 F (36.8 C) 98.3 F (36.8 C)   TempSrc: Oral Oral Oral   SpO2: 99% 96% 98%   Weight:    88 kg (194 lb)  Height:        Intake/Output Summary (Last 24 hours) at 04/30/2018 1240 Last data filed at 04/30/2018 1055 Gross per 24 hour  Intake 120 ml  Output 2750 ml  Net -2630 ml   Filed Weights   04/28/18 0943 04/29/18 0617 04/30/18 0546  Weight: 93.3 kg (205 lb 9.6 oz) 88.6 kg (195 lb 5.2 oz) 88 kg (194 lb)    Examination: General exam: Appears calm and comfortable  Respiratory system: Diminished breath sounds in right lower base. Respiratory effort normal.   Conversational dyspnea or distress Cardiovascular system: S1 & S2 heard, RRR. No JVD, murmurs, rubs, gallops or clicks. No pedal edema. Gastrointestinal system: Abdomen is nondistended, soft and nontender. No organomegaly or masses felt. Normal bowel sounds heard. Central nervous system: Alert and oriented. No focal neurological deficits. Extremities: Symmetric 5 x 5 power. Skin: No rashes, lesions or ulcers Psychiatry: Judgement and insight appear normal. Mood & affect appropriate.    Data Reviewed: I have personally reviewed following labs and imaging studies  CBC: Recent Labs  Lab 04/23/18 2111 04/28/18 0552 04/29/18 0610 04/30/18 0536  WBC 6.7 4.6 5.4 5.2  NEUTROABS 3.7  --   --   --   HGB 13.0 11.7* 12.5 11.7*  HCT 39.6 36.9 39.8 37.6  MCV 91.5 93.2 92.6 91.0  PLT 227 156 150 188   Basic Metabolic Panel: Recent Labs  Lab 04/24/18 0939  04/26/18 0636 04/27/18 0634 04/28/18 0552 04/29/18 0610 04/30/18 0536  NA 140   < > 138 141 137 137 137  K 3.3*   < > 3.6 4.2 4.2 3.9 3.8  CL 104   < > 105 104 104 99* 99*  CO2 24   < > 26 27 24 27 28   GLUCOSE 103*   < > 101* 99 98 97 96  BUN 8   < > 9 10 10 9 11   CREATININE 0.55   < > 0.51 0.57 0.55 0.69 0.75  CALCIUM 8.6*   < > 8.4* 8.8* 8.8* 9.0 8.8*  MG 1.6*  --  2.0 1.7  --   --   --    < > = values in this interval not displayed.   GFR: Estimated Creatinine Clearance: 79.3 mL/min (by C-G formula based on SCr of 0.75 mg/dL). Liver Function Tests: Recent Labs  Lab 04/23/18 2111 04/25/18 0432  AST 47*  --   ALT 15  --   ALKPHOS 94  --   BILITOT 1.9*  --   PROT 6.7  --   ALBUMIN 2.9* 2.4*   No results for input(s): LIPASE, AMYLASE in the last 168 hours. No results for input(s): AMMONIA in the last 168 hours. Coagulation Profile: Recent Labs  Lab 04/24/18 1101  INR 1.31   Cardiac Enzymes: No results for input(s): CKTOTAL, CKMB, CKMBINDEX, TROPONINI in the last 168 hours. BNP (last 3 results) No results for  input(s): PROBNP in the last 8760 hours. HbA1C: No results for input(s): HGBA1C in the last 72 hours. CBG: No results for input(s): GLUCAP in the last 168 hours. Lipid Profile: No results for input(s): CHOL, HDL, LDLCALC, TRIG, CHOLHDL, LDLDIRECT in the last 72 hours. Thyroid Function Tests: No results for input(s): TSH, T4TOTAL, FREET4, T3FREE, THYROIDAB in the last 72 hours. Anemia Panel: No results for input(s): VITAMINB12, FOLATE, FERRITIN, TIBC, IRON, RETICCTPCT in the last 72 hours. Sepsis Labs: Recent Labs  Lab 04/23/18 2112 04/23/18 2306 04/23/18 2324 04/24/18 0939  PROCALCITON  --  <0.10  --   --   LATICACIDVEN 3.15*  --  1.93* 2.1*    Recent Results (from the past 240 hour(s))  Blood Culture (routine x 2)     Status: None   Collection Time: 04/23/18 10:35 PM  Result Value Ref Range Status   Specimen Description   Final    BLOOD LEFT ARM Performed at [facility], [address] [place], [place] [address]    Special Requests   Final    BOTTLES DRAWN AEROBIC AND ANAEROBIC Blood Culture adequate volume Performed at [facility], [address], [place], [place] [address]    Culture   Final    NO GROWTH 5 DAYS Performed at [facility], [place] [address] [place], [place] [address]    Report Status 04/29/2018 FINAL  Final  Blood Culture (routine x 2)     Status: None   Collection Time: 04/23/18 10:45 PM  Result Value Ref Range Status   Specimen Description   Final    BLOOD RIGHT FOREARM Performed at [facility], [address] [place], [place] [address]    Special Requests   Final    BOTTLES DRAWN AEROBIC AND ANAEROBIC Blood Culture results may not be optimal due to an inadequate volume of blood received in culture bottles Performed at [facility], [address] [place], [place] [address]    Culture   Final    NO GROWTH 5 DAYS Performed at [facility], [place] [address], [place], [place] [address]     Report Status 04/29/2018 FINAL  Final  Culture, body fluid-bottle     Status: None   Collection Time: 04/24/18  8:45 AM  Result Value Ref Range Status   Specimen Description PLEURAL FLUID  Final   Special Requests BOTTLES DRAWN AEROBIC AND ANAEROBIC  Final   Culture   Final    NO GROWTH 5 DAYS Performed at [facility], [place] [address] [place], [place] [address]    Report Status 04/29/2018 FINAL  Final  Gram stain     Status: None   Collection Time: 04/24/18  8:45 AM  Result Value Ref Range Status   Specimen Description PLEURAL FLUID  Final   Special Requests NONE  Final   Gram Stain   Final    WBC PRESENT,BOTH PMN AND MONONUCLEAR NO ORGANISMS SEEN CYTOSPIN SMEAR Performed at [facility], [address], [place], [place] [address]    Report Status 04/24/2018 FINAL  Final  MRSA PCR Screening     Status: None   Collection Time: 04/24/18 11:06 AM  Result Value Ref Range Status   MRSA by PCR NEGATIVE NEGATIVE Final    Comment:        The GeneXpert MRSA Assay (FDA approved for NASAL specimens only), is one component of a comprehensive MRSA colonization surveillance program. It is not intended to diagnose MRSA infection nor to guide or monitor treatment for MRSA infections. Performed at [facility], [place] [address], [place], [place] [address]   Culture, body fluid-bottle     Status: None   Collection Time: 04/25/18  1:53 PM  Result Value Ref Range Status   Specimen Description PERITONEAL  Final   Special Requests NONE  Final   Culture   Final    NO GROWTH 5 DAYS Performed at [facility], [address], [place], [place] [address]    Report Status 04/30/2018 FINAL  Final  Gram stain     Status: None   Collection Time: 04/25/18  1:53 PM  Result Value Ref Range Status   Specimen Description PERITONEAL  Final   Special Requests NONE  Final   Gram Stain   Final    WBC PRESENT, PREDOMINANTLY MONONUCLEAR NO ORGANISMS SEEN Performed at  [facility], [place] [address] [place], [place] [address]    Report Status 04/25/2018 FINAL  Final       Radiology Studies: Dg Chest 1 View  Result Date: 04/28/2018 CLINICAL DATA:  Status post right thoracentesis. EXAM: CHEST  1 VIEW COMPARISON:  04/28/2018 at 10:12 a.m. FINDINGS: Since the prior exam, there has been a reduction in the right-sided pleural effusion. There is still opacity that extends from the right mid to lower lung obscuring the right heart border and right hemidiaphragm consistent with residual pleural fluid and either pneumonia or atelectasis. No pneumothorax. Left lung remains clear.  No left pleural effusion. IMPRESSION: 1. Significant decrease in right pleural fluid following thoracentesis. No pneumothorax. 2. Moderate residual pleural effusion with associated lung base opacity consistent with atelectasis or pneumonia. Electronically Signed   By: Lajean Manes M.D.   On: 04/28/2018 16:12   US Thoracentesis Asp Pleural Space W/img Guide  Result Date: 04/28/2018 INDICATION: NASH cirrhosis, dyspnea, recurrent right pleural effusion. Request made for therapeutic  right thoracentesis. EXAM: ULTRASOUND GUIDED THERAPEUTIC RIGHT THORACENTESIS MEDICATIONS: None COMPLICATIONS: None immediate. PROCEDURE: An ultrasound guided thoracentesis was thoroughly discussed with the patient and questions answered. The benefits, risks, alternatives and complications were also discussed. The patient understands and wishes to proceed with the procedure. Written consent was obtained. Ultrasound was performed to localize and mark an adequate pocket of fluid in the right chest. The area was then prepped and draped in the normal sterile fashion. 1% Lidocaine was used for local anesthesia. Under ultrasound guidance a 6 Fr Safe-T-Centesis catheter was introduced. Thoracentesis was performed. The catheter was removed and a dressing applied. FINDINGS: A total of approximately 1.9 liters of turbid, amber fluid  was removed. IMPRESSION: Successful ultrasound guided therapeutic right thoracentesis yielding 1.9 liters of pleural fluid. Read by: Rowe Robert, PA-C Electronically Signed   By: Sandi Mariscal M.D.   On: 04/28/2018 15:51      Scheduled Meds: . atenolol  50 mg Oral q morning - 10a  . Eluxadoline  75 mg Oral BID  . enoxaparin (LOVENOX) injection  40 mg Subcutaneous Q24H  . furosemide  80 mg Intravenous BID  . pantoprazole  40 mg Oral q morning - 10a  . spironolactone  200 mg Oral Daily   Continuous Infusions:   LOS: 6 days    Time spent: 25 minutes   Dessa Phi, DO [facility] [email or web address] Password [email or web address] 04/30/2018, 12:40 PM

## 2018-04-30 NOTE — Progress Notes (Signed)
OT Cancellation Note  Patient Details Name: Dawn Moon MRN: [id number] DOB: Mar 24, 1956   Cancelled Treatment:    Reason Eval/Treat Not Completed: OT screened, no needs identified, will sign off  Gustavo Meditz A Celina Shiley 04/30/2018, 11:15 AM

## 2018-05-01 LAB — CBC
HCT: 40.9 % (ref 36.0–46.0)
HEMOGLOBIN: 13.3 g/dL (ref 12.0–15.0)
MCH: 29.8 pg (ref 26.0–34.0)
MCHC: 32.5 g/dL (ref 30.0–36.0)
MCV: 91.7 fL (ref 78.0–100.0)
Platelets: 160 10*3/uL (ref 150–400)
RBC: 4.46 MIL/uL (ref 3.87–5.11)
RDW: 15.5 % (ref 11.5–15.5)
WBC: 6.2 10*3/uL (ref 4.0–10.5)

## 2018-05-01 LAB — BASIC METABOLIC PANEL
Anion gap: 14 (ref 5–15)
BUN: 10 mg/dL (ref 6–20)
CALCIUM: 9.1 mg/dL (ref 8.9–10.3)
CHLORIDE: 96 mmol/L — AB (ref 101–111)
CO2: 27 mmol/L (ref 22–32)
CREATININE: 0.8 mg/dL (ref 0.44–1.00)
GFR calc Af Amer: >60 mL/min (ref >60)
GFR calc non Af Amer: >60 mL/min (ref >60)
Glucose, Bld: 98 mg/dL (ref 65–99)
Potassium: 4.1 mmol/L (ref 3.5–5.1)
SODIUM: 137 mmol/L (ref 135–145)

## 2018-05-01 MED ORDER — FUROSEMIDE 40 MG PO TABS
80.0000 mg | ORAL_TABLET | Freq: Every day | ORAL | Status: DC
  Administered 2018-05-02 – 2018-05-03 (×2): 80 mg via ORAL
  Filled 2018-05-01 (×2): qty 2

## 2018-05-01 NOTE — Progress Notes (Signed)
PROGRESS NOTE    Dawn Moon  ENI:[id number] DOB: 1956/04/01 DOA: 04/23/2018 PCP: Leamon Arnt, MD     Brief Narrative:  Dawn Moon is a 62 year old female with IBS, paroxysmal SVT, hypertension, NASH who underwent a right knee replacement on 4/4 and was discharged from the hospital on 4/5. She presents for shortness of breath steadily worsening since her surgery.  She had doubled her Lasix to 40 mg daily because her right leg has been swollen as well.  She became suddenly more short of breath and presented to the ER.  CTA was done to rule out PE and showed large right-sided pleural effusion with with atelectasis of the right lower lobe and partial atelectasis of the right mid and upper lobes, lobulated liver with enlarged left lobe compatible with cirrhosis, large volume ascites and mild enlargement of mesenteric lymph nodes. She was admitted to the stepdown unit and underwent right thoracentesis 5/5 as well as paracentesis 5/7.  Despite diuresis, patient developed recurrent right-sided pleural effusion and underwent second right thoracentesis 5/9.  Assessment & Plan:   Principal Problem:   Acute hypoxemic respiratory failure (HCC) Active Problems:   Essential hypertension   NSVT (nonsustained ventricular tachycardia) (HCC)   IBS (irritable bowel syndrome)   Cirrhosis of liver with ascites (HCC)   Pleural effusion on right   Leg edema, right   Ascites   Hypoxia   SOB (shortness of breath)   Acute hypoxic respiratory failure  -Secondary to right pleural effusion due to hepatic hydrothorax -S/p right thoracentesis 5/5 with removal of 2 L of fluid, transudative, gram stain negative   -Repeat CXR 5/7 showed significant recurrent right-sided pleural effusion greater than that seen on the pre-thoracentesis images. -Echo EF 60-65% without evidence of diastolic dysfunction, cardiology following  -Repeat CXR 5/9 showed increasing right-sided pleural effusion with complete  opacification of the right hemithorax -S/p right thoracentesis 5/9 with removal of 1.9 L of fluid.  Postthoracentesis chest x-ray showed moderate residual pleural effusion on right -Repeat CXR 5/11 showed small right pleural effusion  -Decreased Lasix PO 14m QD and Aldactone to 2076mQD -Continues to diurese well  Cirrhosis secondary to NASH with ascites  -S/p paracentesis 5/7 with removal of 2.9 L, culture negative to date  -GI following   Right leg edema -Occurring in relation to right knee surgery early April  -Venous duplex negative   Essential hypertension -Continue atenolol  NSVT (nonsustained ventricular tachycardia) -Was started on atenolol by Dr. KlCaryl ComesTelemetry   IBS (irritable bowel syndrome) -Cont Eluxadoline as prescribed by GI  GERD -Continue Protonix   DVT prophylaxis: Lovenox (has been on aspirin BID post-op for DVT ppx) Code Status: Full Family Communication: Family at bedside Disposition Plan: Pending improvement, continued diuresis    Consultants:   GI  Cardiology  Procedures:   Right thoracentesis 5/5  Paracentesis 5/7  Right thoracentesis 5/9   Antimicrobials:  Anti-infectives (From admission, onward)   Start     Dose/Rate Route Frequency Ordered Stop   04/24/18 2200  vancomycin (VANCOCIN) IVPB 1000 mg/200 mL premix  Status:  Discontinued     1,000 mg 200 mL/hr over 60 Minutes Intravenous Every 24 hours 04/24/18 0256 04/24/18 1444   04/24/18 1100  vancomycin (VANCOCIN) 1,250 mg in sodium chloride 0.9 % 250 mL IVPB  Status:  Discontinued     1,250 mg 166.7 mL/hr over 90 Minutes Intravenous Every 12 hours 04/23/18 2242 04/24/18 0215   04/24/18 0800  ceFEPIme (MAXIPIME) 2 g in  sodium chloride 0.9 % 100 mL IVPB  Status:  Discontinued     2 g 200 mL/hr over 30 Minutes Intravenous Every 8 hours 04/23/18 2242 04/24/18 0215   04/24/18 0800  ceFEPIme (MAXIPIME) 2 g in sodium chloride 0.9 % 100 mL IVPB  Status:  Discontinued     2 g 200  mL/hr over 30 Minutes Intravenous Every 8 hours 04/24/18 0256 04/24/18 1444   04/23/18 2340  vancomycin (VANCOCIN) 1000 MG powder  Status:  Discontinued    Note to Pharmacy:  Jaci Carrel   : cabinet override      04/23/18 2340 04/24/18 0246   04/23/18 2245  vancomycin (VANCOCIN) 2,000 mg in sodium chloride 0.9 % 500 mL IVPB     2,000 mg 250 mL/hr over 120 Minutes Intravenous  Once 04/23/18 2238 04/24/18 0239   04/23/18 2245  ceFEPIme (MAXIPIME) 2 g injection  Status:  Discontinued    Note to Pharmacy:  Percell Miller   : cabinet override      04/23/18 2245 04/24/18 0246   04/23/18 2230  ceFEPIme (MAXIPIME) 2 g in sodium chloride 0.9 % 100 mL IVPB     2 g 200 mL/hr over 30 Minutes Intravenous  Once 04/23/18 2229 04/23/18 2330   04/23/18 2230  vancomycin (VANCOCIN) IVPB 1000 mg/200 mL premix  Status:  Discontinued     1,000 mg 200 mL/hr over 60 Minutes Intravenous  Once 04/23/18 2229 04/23/18 2237       Subjective: Doing well today.  No physical complaints.  States that her lower extremity edema is much better.  Objective: Vitals:   04/30/18 1457 04/30/18 2101 05/01/18 0511 05/01/18 0513  BP: 112/64 116/60  (!) 122/56  Pulse: 82 81  88  Resp: 18 18  18   Temp: 98.5 F (36.9 C) 98.1 F (36.7 C)  97.9 F (36.6 C)  TempSrc: Oral Oral  Oral  SpO2: 95% 91%  95%  Weight:   89.1 kg (196 lb 6.9 oz)   Height:        Intake/Output Summary (Last 24 hours) at 05/01/2018 1317 Last data filed at 05/01/2018 0516 Gross per 24 hour  Intake 480 ml  Output 1700 ml  Net -1220 ml   Filed Weights   04/29/18 0617 04/30/18 0546 05/01/18 0511  Weight: 88.6 kg (195 lb 5.2 oz) 88 kg (194 lb) 89.1 kg (196 lb 6.9 oz)    Examination: General exam: Appears calm and comfortable  Respiratory system: Diminished breath sounds right base. Respiratory effort normal. Cardiovascular system: S1 & S2 heard, RRR. No JVD, murmurs, rubs, gallops or clicks. No pedal edema. Gastrointestinal system: Abdomen  is nondistended, soft and nontender. No organomegaly or masses felt. Normal bowel sounds heard. Central nervous system: Alert and oriented. No focal neurological deficits. Extremities: Symmetric 5 x 5 power. Skin: No rashes, lesions or ulcers Psychiatry: Judgement and insight appear normal. Mood & affect appropriate.    Data Reviewed: I have personally reviewed following labs and imaging studies  CBC: Recent Labs  Lab 04/28/18 0552 04/29/18 0610 04/30/18 0536 05/01/18 0543  WBC 4.6 5.4 5.2 6.2  HGB 11.7* 12.5 11.7* 13.3  HCT 36.9 39.8 37.6 40.9  MCV 93.2 92.6 91.0 91.7  PLT 156 150 154 664   Basic Metabolic Panel: Recent Labs  Lab 04/26/18 0636 04/27/18 0634 04/28/18 0552 04/29/18 0610 04/30/18 0536 05/01/18 0543  NA 138 141 137 137 137 137  K 3.6 4.2 4.2 3.9 3.8 4.1  CL 105 104 104  99* 99* 96*  CO2 26 27 24 27 28 27   GLUCOSE 101* 99 98 97 96 98  BUN 9 10 10 9 11 10   CREATININE 0.51 0.57 0.55 0.69 0.75 0.80  CALCIUM 8.4* 8.8* 8.8* 9.0 8.8* 9.1  MG 2.0 1.7  --   --   --   --    GFR: Estimated Creatinine Clearance: 79.9 mL/min (by C-G formula based on SCr of 0.8 mg/dL). Liver Function Tests: Recent Labs  Lab 04/25/18 0432  ALBUMIN 2.4*   No results for input(s): LIPASE, AMYLASE in the last 168 hours. No results for input(s): AMMONIA in the last 168 hours. Coagulation Profile: No results for input(s): INR, PROTIME in the last 168 hours. Cardiac Enzymes: No results for input(s): CKTOTAL, CKMB, CKMBINDEX, TROPONINI in the last 168 hours. BNP (last 3 results) No results for input(s): PROBNP in the last 8760 hours. HbA1C: No results for input(s): HGBA1C in the last 72 hours. CBG: No results for input(s): GLUCAP in the last 168 hours. Lipid Profile: No results for input(s): CHOL, HDL, LDLCALC, TRIG, CHOLHDL, LDLDIRECT in the last 72 hours. Thyroid Function Tests: No results for input(s): TSH, T4TOTAL, FREET4, T3FREE, THYROIDAB in the last 72 hours. Anemia  Panel: No results for input(s): VITAMINB12, FOLATE, FERRITIN, TIBC, IRON, RETICCTPCT in the last 72 hours. Sepsis Labs: No results for input(s): PROCALCITON, LATICACIDVEN in the last 168 hours.  Recent Results (from the past 240 hour(s))  Blood Culture (routine x 2)     Status: None   Collection Time: 04/23/18 10:35 PM  Result Value Ref Range Status   Specimen Description   Final    BLOOD LEFT ARM Performed at [facility], [address] [place], [place] [address]    Special Requests   Final    BOTTLES DRAWN AEROBIC AND ANAEROBIC Blood Culture adequate volume Performed at [facility], [address] [place], [place] [address]    Culture   Final    NO GROWTH 5 DAYS Performed at [facility], [place] [address], [place], [place] [address]    Report Status 04/29/2018 FINAL  Final  Blood Culture (routine x 2)     Status: None   Collection Time: 04/23/18 10:45 PM  Result Value Ref Range Status   Specimen Description   Final    BLOOD RIGHT FOREARM Performed at [facility], [address] [place], [place] [address]    Special Requests   Final    BOTTLES DRAWN AEROBIC AND ANAEROBIC Blood Culture results may not be optimal due to an inadequate volume of blood received in culture bottles Performed at [facility], [address] [place], [place] [address]    Culture   Final    NO GROWTH 5 DAYS Performed at [facility], [place] [address] [place], [place] [address]    Report Status 04/29/2018 FINAL  Final  Culture, body fluid-bottle     Status: None   Collection Time: 04/24/18  8:45 AM  Result Value Ref Range Status   Specimen Description PLEURAL FLUID  Final   Special Requests BOTTLES DRAWN AEROBIC AND ANAEROBIC  Final   Culture   Final    NO GROWTH 5 DAYS Performed at [facility], [place] [address] [place],  [address]    Report Status 04/29/2018 FINAL  Final  Gram stain     Status: None   Collection Time:  04/24/18  8:45 AM  Result Value Ref Range Status   Specimen Description PLEURAL FLUID  Final   Special Requests NONE  Final   Gram Stain   Final    WBC PRESENT,BOTH PMN AND MONONUCLEAR NO ORGANISMS SEEN CYTOSPIN SMEAR Performed at [facility], [address], [place], [place] [address]    Report Status 04/24/2018 FINAL  Final  MRSA PCR Screening     Status: None   Collection Time: 04/24/18 11:06 AM  Result Value Ref Range Status   MRSA by PCR NEGATIVE NEGATIVE Final    Comment:        The GeneXpert MRSA Assay (FDA approved for NASAL specimens only), is one component of a comprehensive MRSA colonization surveillance program. It is not intended to diagnose MRSA infection nor to guide or monitor treatment for MRSA infections. Performed at [facility], [place] [address], [place], [place] [address]   Culture, body fluid-bottle     Status: None   Collection Time: 04/25/18  1:53 PM  Result Value Ref Range Status   Specimen Description PERITONEAL  Final   Special Requests NONE  Final   Culture   Final    NO GROWTH 5 DAYS Performed at [facility], [address], [place], [place] [address]    Report Status 04/30/2018 FINAL  Final  Gram stain     Status: None   Collection Time: 04/25/18  1:53 PM  Result Value Ref Range Status   Specimen Description PERITONEAL  Final   Special Requests NONE  Final   Gram Stain   Final    WBC PRESENT, PREDOMINANTLY MONONUCLEAR NO ORGANISMS SEEN Performed at [facility], [place] [address], [place], [place] [address]    Report Status 04/25/2018 FINAL  Final       Radiology Studies: Dg Chest Port 1 View  Result Date: 04/30/2018 CLINICAL DATA:  Recurrent right pleural effusion EXAM: PORTABLE CHEST 1 VIEW COMPARISON:  04/28/2018 FINDINGS: There is a small right pleural effusion decreased compared with 04/28/2018. There is no left pleural effusion. There is no focal consolidation. There is no pneumothorax. The  osseous structures are unremarkable. IMPRESSION: Small right pleural effusion. Electronically Signed   By: Kathreen Devoid   On: 04/30/2018 13:15      Scheduled Meds: . atenolol  50 mg Oral q morning - 10a  . Eluxadoline  75 mg Oral BID  . enoxaparin (LOVENOX) injection  40 mg Subcutaneous Q24H  . furosemide  80 mg Oral Daily  . pantoprazole  40 mg Oral q morning - 10a  . spironolactone  200 mg Oral Daily   Continuous Infusions:   LOS: 7 days    Time spent: 25 minutes   Dessa Phi, DO [facility] [email or web address] Password [email or web address] 05/01/2018, 1:17 PM

## 2018-05-01 NOTE — Progress Notes (Signed)
Cross cover [facility]-GI Subjective: Since I last evaluated the patient, she seems to be doing fairly well. She denies having any abdominal pain nausea vomiting diarrhea constipation. The nausea she was having yesterday has since resolved she's had a formed stool this morning with no evidence of melena hematochezia. Her pedal edema has resolved..   Objective: Vital signs in last 24 hours: Temp:  [97.9 F (36.6 C)-98.5 F (36.9 C)] 97.9 F (36.6 C) (05/12 0513) Pulse Rate:  [81-88] 88 (05/12 0513) Resp:  [18] 18 (05/12 0513) BP: (112-122)/(56-64) 122/56 (05/12 0513) SpO2:  [91 %-95 %] 95 % (05/12 0513) FiO2 (%):  [0 %] 0 % (05/11 1457) Weight:  [89.1 kg (196 lb 6.9 oz)] 89.1 kg (196 lb 6.9 oz) (05/12 0511) Last BM Date: 04/29/18  Intake/Output from previous day: 05/11 0701 - 05/12 0700 In: 720 [P.O.:720] Out: 3000 [Urine:3000] Intake/Output this shift: No intake/output data recorded.  General appearance: alert, cooperative, appears stated age and no distress Resp: clear to auscultation bilaterally Cardio: regular rate and rhythm, S1, S2 normal, no murmur, click, rub or gallop GI: soft, non-tender; bowel sounds normal; no masses,  no organomegaly Extremities: extremities normal, atraumatic, no cyanosis or edema; there is a  Scar on the right knee from recent to total knee replacement.  Lab Results: Recent Labs    04/29/18 0610 04/30/18 0536 05/01/18 0543  WBC 5.4 5.2 6.2  HGB 12.5 11.7* 13.3  HCT 39.8 37.6 40.9  PLT 150 154 160   BMET Recent Labs    04/29/18 0610 04/30/18 0536 05/01/18 0543  NA 137 137 137  K 3.9 3.8 4.1  CL 99* 99* 96*  CO2 27 28 27   GLUCOSE 97 96 98  BUN 9 11 10   CREATININE 0.69 0.75 0.80  CALCIUM 9.0 8.8* 9.1   Studies/Results: Dg Chest Port 1 View  Result Date: 04/30/2018 CLINICAL DATA:  Recurrent right pleural effusion EXAM: PORTABLE CHEST 1 VIEW COMPARISON:  04/28/2018 FINDINGS: There is a small right pleural effusion decreased compared with  04/28/2018. There is no left pleural effusion. There is no focal consolidation. There is no pneumothorax. The osseous structures are unremarkable. IMPRESSION: Small right pleural effusion. Electronically Signed   By: Kathreen Devoid   On: 04/30/2018 13:15    Medications: I have reviewed the patient's current medications.  Assessment/Plan: [name] cirrhosis diagnosed incidentally after patient presented with respiratory failure after total knee replacement on the right side-patient had a paracentesis and thoracentesis and is doing well on diuretics at the present time symptoms seem to be under control and her respiratory status has improved. Continue present care patient should be discharged on diuretics and follow up with Dr. Silverio Decamp on outpatient basis for further treatment of her [name] cirrhosis.   LOS: 7 days   Quaron Delacruz 05/01/2018, 9:49 AM

## 2018-05-01 NOTE — Progress Notes (Signed)
Progress Note  Patient Name: Dawn Moon Date of Encounter: 05/01/2018  Primary Cardiologist: Dr. Kirk Ruths  Subjective   Feels much better today.  No chest pain or shortness of breath.  No palpitations.  Inpatient Medications    Scheduled Meds: . atenolol  50 mg Oral q morning - 10a  . Eluxadoline  75 mg Oral BID  . enoxaparin (LOVENOX) injection  40 mg Subcutaneous Q24H  . furosemide  80 mg Intravenous BID  . pantoprazole  40 mg Oral q morning - 10a  . spironolactone  200 mg Oral Daily    PRN Meds: ondansetron **OR** ondansetron (ZOFRAN) IV, oxyCODONE, promethazine, traMADol   Vital Signs    Vitals:   04/30/18 1457 04/30/18 2101 05/01/18 0511 05/01/18 0513  BP: 112/64 116/60  (!) 122/56  Pulse: 82 81  88  Resp: 18 18  18   Temp: 98.5 F (36.9 C) 98.1 F (36.7 C)  97.9 F (36.6 C)  TempSrc: Oral Oral  Oral  SpO2: 95% 91%  95%  Weight:   196 lb 6.9 oz (89.1 kg)   Height:        Intake/Output Summary (Last 24 hours) at 05/01/2018 0859 Last data filed at 05/01/2018 0516 Gross per 24 hour  Intake 480 ml  Output 3000 ml  Net -2520 ml   Filed Weights   04/29/18 0617 04/30/18 0546 05/01/18 0511  Weight: 195 lb 5.2 oz (88.6 kg) 194 lb (88 kg) 196 lb 6.9 oz (89.1 kg)    Telemetry    Sinus rhythm.  Personally reviewed.  Physical Exam   GEN: No acute distress.   Neck: No JVD. Cardiac: RRR, no murmur or gallop.  Respiratory: Nonlabored.  Decreased breath sounds mainly at right base.Marland Kitchen GI: Soft, nontender, bowel sounds present. MS:  Right knee with healed surgical scar. Neuro:  Nonfocal. Psych: Alert and oriented x 3. Normal affect.  Labs    Chemistry Recent Labs  Lab 04/25/18 [phone]  04/29/18 0610 04/30/18 0536 05/01/18 0543  NA 140   < > 137 137 137  K 3.2*   < > 3.9 3.8 4.1  CL 104   < > 99* 99* 96*  CO2 28   < > 27 28 27   GLUCOSE 101*   < > 97 96 98  BUN 8   < > 9 11 10   CREATININE 0.66   < > 0.69 0.75 0.80  CALCIUM 8.1*   < > 9.0  8.8* 9.1  ALBUMIN 2.4*  --   --   --   --   GFRNONAA >60   < > >60 >60 >60  GFRAA >60   < > >60 >60 >60  ANIONGAP 8   < > 11 10 14    < > = values in this interval not displayed.     Hematology Recent Labs  Lab 04/29/18 0610 04/30/18 0536 05/01/18 0543  WBC 5.4 5.2 6.2  RBC 4.30 4.13 4.46  HGB 12.5 11.7* 13.3  HCT 39.8 37.6 40.9  MCV 92.6 91.0 91.7  MCH 29.1 28.3 29.8  MCHC 31.4 31.1 32.5  RDW 15.6* 15.7* 15.5  PLT 150 154 160    Radiology    Dg Chest Port 1 View  Result Date: 04/30/2018 CLINICAL DATA:  Recurrent right pleural effusion EXAM: PORTABLE CHEST 1 VIEW COMPARISON:  04/28/2018 FINDINGS: There is a small right pleural effusion decreased compared with 04/28/2018. There is no left pleural effusion. There is no focal consolidation. There is no pneumothorax.  The osseous structures are unremarkable. IMPRESSION: Small right pleural effusion. Electronically Signed   By: Kathreen Devoid   On: 04/30/2018 13:15    Cardiac Studies   Echocardiogram 04/25/2018: Study Conclusions  - Left ventricle: The cavity size was normal. Wall thickness was normal. Systolic function was normal. The estimated ejection fraction was in the range of 60% to 65%. Wall motion was normal; there were no regional wall motion abnormalities. There was no evidence of elevated ventricular filling pressure by Doppler parameters. - Left atrium: The atrium was mildly dilated.  Patient Profile     62 y.o. female with a history of hypertension, NSVT, NASH, right knee replacement within the last month, presenting with large right pleural effusion and ascites.  She is status post thoracentesis and continues on IV diuresis.  Assessment & Plan    1.  Hypoxic respiratory failure in the setting of large right pleural effusion, significantly improved.  She has undergone thoracentesis and also IV diuresis.  Follow-up chest x-ray yesterday showed small residual right pleural effusion with improvement  compared to the prior film.  She diuresed an additional 2200 cc net out more than in last 24 hours.  2. NASH with ascites.  3.  Status post recent right knee replacement.  Continues to work with physical therapy.  Using a walker.  Continue Aldactone, transition to oral Lasix 80 mg once daily.  Reassess urine output tomorrow to determine outpatient diuretic dosing.  Follow-up BMET in a.m.  Signed, Rozann Lesches, MD  05/01/2018, 8:59 AM

## 2018-05-01 NOTE — Progress Notes (Addendum)
Cross cover [facility] Subjective: Since I last evaluated the patient, she seems to be doing much better. She denies having any nausea vomiting or abdominal pain.. She's had one well-formed bowel movement today with a recurrent evidence of melena or hematochezia. The pedal edema has resolved as well. She has a lot of family at bedside visiting her for Mother's Day.   Objective: Vital signs in last 24 hours: Temp:  [97.9 F (36.6 C)-98.5 F (36.9 C)] 97.9 F (36.6 C) (05/12 0513) Pulse Rate:  [81-88] 88 (05/12 0513) Resp:  [18] 18 (05/12 0513) BP: (112-122)/(56-64) 122/56 (05/12 0513) SpO2:  [91 %-95 %] 95 % (05/12 0513) FiO2 (%):  [0 %] 0 % (05/11 1457) Weight:  [89.1 kg (196 lb 6.9 oz)] 89.1 kg (196 lb 6.9 oz) (05/12 0511) Last BM Date: 04/29/18  Intake/Output from previous day: 05/11 0701 - 05/12 0700 In: 720 [P.O.:720] Out: 3000 [Urine:3000] Intake/Output this shift: No intake/output data recorded.  General appearance: alert, cooperative, appears stated age and no distress Resp: clear to auscultation bilaterally Cardio: regular rate and rhythm, S1, S2 normal, no murmur, click, rub or gallop GI: soft, non-tender; bowel sounds normal; no masses,  no organomegaly Extremities: extremities-all normal, atraumatic, no cyanosis or edema; scar over right knee from recnt knee replacement  Lab Results: Recent Labs    04/29/18 0610 04/30/18 0536 05/01/18 0543  WBC 5.4 5.2 6.2  HGB 12.5 11.7* 13.3  HCT 39.8 37.6 40.9  PLT 150 154 160   BMET Recent Labs    04/29/18 0610 04/30/18 0536 05/01/18 0543  NA 137 137 137  K 3.9 3.8 4.1  CL 99* 99* 96*  CO2 27 28 27   GLUCOSE 97 96 98  BUN 9 11 10   CREATININE 0.69 0.75 0.80  CALCIUM 9.0 8.8* 9.1   LStudies/Results: Dg Chest Port 1 View  Result Date: 04/30/2018 CLINICAL DATA:  Recurrent right pleural effusion EXAM: PORTABLE CHEST 1 VIEW COMPARISON:  04/28/2018 FINDINGS: There is a small right pleural effusion decreased compared with  04/28/2018. There is no left pleural effusion. There is no focal consolidation. There is no pneumothorax. The osseous structures are unremarkable. IMPRESSION: Small right pleural effusion. Electronically Signed   By: Kathreen Devoid   On: 04/30/2018 13:15   Medications: I have reviewed the patient's current medications.  Assessment/Plan:   LOS: 7 days  [name] cirrhosis diagnosed incidentally after total knee replacement currently doing well on diuretics. Renal function continues to be normal. ontinue present care. Patient will need to follow-up with Dr. Silverio Decamp after discharge for close monitoring of her LFTs.  Nainoa Woldt 05/01/2018, 12:41 PM

## 2018-05-02 ENCOUNTER — Telehealth: Payer: Self-pay | Admitting: Family Medicine

## 2018-05-02 ENCOUNTER — Inpatient Hospital Stay (HOSPITAL_COMMUNITY): Payer: 59

## 2018-05-02 LAB — BASIC METABOLIC PANEL
ANION GAP: 11 (ref 5–15)
BUN: 10 mg/dL (ref 6–20)
CO2: 26 mmol/L (ref 22–32)
Calcium: 8.7 mg/dL — ABNORMAL LOW (ref 8.9–10.3)
Chloride: 98 mmol/L — ABNORMAL LOW (ref 101–111)
Creatinine, Ser: 0.77 mg/dL (ref 0.44–1.00)
GFR calc Af Amer: >60 mL/min (ref >60)
Glucose, Bld: 98 mg/dL (ref 65–99)
POTASSIUM: 3.7 mmol/L (ref 3.5–5.1)
SODIUM: 135 mmol/L (ref 135–145)

## 2018-05-02 LAB — CBC
HCT: 37.5 % (ref 36.0–46.0)
Hemoglobin: 12.1 g/dL (ref 12.0–15.0)
MCH: 29.3 pg (ref 26.0–34.0)
MCHC: 32.3 g/dL (ref 30.0–36.0)
MCV: 90.8 fL (ref 78.0–100.0)
PLATELETS: 152 10*3/uL (ref 150–400)
RBC: 4.13 MIL/uL (ref 3.87–5.11)
RDW: 15.3 % (ref 11.5–15.5)
WBC: 6.2 10*3/uL (ref 4.0–10.5)

## 2018-05-02 MED ORDER — ATENOLOL 25 MG PO TABS: 25.0000 mg | ORAL_TABLET | Freq: Every morning | ORAL | Status: DC

## 2018-05-02 NOTE — Telephone Encounter (Signed)
Please call patient and have her to take to speciality office. Per Dr. Isaias Sakai,  LPN

## 2018-05-02 NOTE — Progress Notes (Signed)
PROGRESS NOTE    Dawn Moon  GDJ:[id number] DOB: 10-17-1956 DOA: 04/23/2018 PCP: Leamon Arnt, MD     Brief Narrative:  Dawn Moon is a 62 year old female with IBS, paroxysmal SVT, hypertension, NASH who underwent a right knee replacement on 4/4 and was discharged from the hospital on 4/5. She presents for shortness of breath steadily worsening since her surgery.  She had doubled her Lasix to 40 mg daily because her right leg has been swollen as well.  She became suddenly more short of breath and presented to the ER.  CTA was done to rule out PE and showed large right-sided pleural effusion with with atelectasis of the right lower lobe and partial atelectasis of the right mid and upper lobes, lobulated liver with enlarged left lobe compatible with cirrhosis, large volume ascites and mild enlargement of mesenteric lymph nodes. She was admitted to the stepdown unit and underwent right thoracentesis 5/5 as well as paracentesis 5/7.  Despite diuresis, patient developed recurrent right-sided pleural effusion and underwent second right thoracentesis 5/9.  Assessment & Plan:   Principal Problem:   Acute hypoxemic respiratory failure (HCC) Active Problems:   Essential hypertension   NSVT (nonsustained ventricular tachycardia) (HCC)   IBS (irritable bowel syndrome)   Cirrhosis of liver with ascites (HCC)   Pleural effusion on right   Leg edema, right   Ascites   Hypoxia   SOB (shortness of breath)   Acute hypoxic respiratory failure  -Secondary to right pleural effusion due to hepatic hydrothorax -S/p right thoracentesis 5/5 with removal of 2 L of fluid, transudative, gram stain negative   -Repeat CXR 5/7 showed significant recurrent right-sided pleural effusion greater than that seen on the pre-thoracentesis images. -Echo EF 60-65% without evidence of diastolic dysfunction, cardiology following  -Repeat CXR 5/9 showed increasing right-sided pleural effusion with complete  opacification of the right hemithorax -S/p right thoracentesis 5/9 with removal of 1.9 L of fluid.  Postthoracentesis chest x-ray showed moderate residual pleural effusion on right -Repeat CXR 5/11 showed small right pleural effusion  -Repeat CXR 5/13 showed moderate right pleural effusion, reviewed independently and appears only mildly progressed from previous CXR  -Decreased Lasix PO 10m QD and Aldactone to 2026mQD -Continues to diurese well and now off [place] O2   Cirrhosis secondary to NASH with ascites  -S/p paracentesis 5/7 with removal of 2.9 L, culture negative to date  -GI following   Right leg edema -Occurring in relation to right knee surgery early April  -Venous duplex negative   Essential hypertension -Continue atenolol, dose decreased today   NSVT (nonsustained ventricular tachycardia) -Was started on atenolol by Dr. KlCaryl ComesTelemetry   IBS (irritable bowel syndrome) -Cont Eluxadoline as prescribed by GI  GERD -Continue Protonix   DVT prophylaxis: Lovenox (has been on aspirin BID post-op for DVT ppx) Code Status: Full Family Communication: No family at bedside Disposition Plan: Pending improvement, continued diuresis    Consultants:   GI  Cardiology  Procedures:   Right thoracentesis 5/5  Paracentesis 5/7  Right thoracentesis 5/9   Antimicrobials:  Anti-infectives (From admission, onward)   Start     Dose/Rate Route Frequency Ordered Stop   04/24/18 2200  vancomycin (VANCOCIN) IVPB 1000 mg/200 mL premix  Status:  Discontinued     1,000 mg 200 mL/hr over 60 Minutes Intravenous Every 24 hours 04/24/18 0256 04/24/18 1444   04/24/18 1100  vancomycin (VANCOCIN) 1,250 mg in sodium chloride 0.9 % 250 mL IVPB  Status:  Discontinued     1,250 mg 166.7 mL/hr over 90 Minutes Intravenous Every 12 hours 04/23/18 2242 04/24/18 0215   04/24/18 0800  ceFEPIme (MAXIPIME) 2 g in sodium chloride 0.9 % 100 mL IVPB  Status:  Discontinued     2 g 200 mL/hr over  30 Minutes Intravenous Every 8 hours 04/23/18 2242 04/24/18 0215   04/24/18 0800  ceFEPIme (MAXIPIME) 2 g in sodium chloride 0.9 % 100 mL IVPB  Status:  Discontinued     2 g 200 mL/hr over 30 Minutes Intravenous Every 8 hours 04/24/18 0256 04/24/18 1444   04/23/18 2340  vancomycin (VANCOCIN) 1000 MG powder  Status:  Discontinued    Note to Pharmacy:  Jaci Carrel   : cabinet override      04/23/18 2340 04/24/18 0246   04/23/18 2245  vancomycin (VANCOCIN) 2,000 mg in sodium chloride 0.9 % 500 mL IVPB     2,000 mg 250 mL/hr over 120 Minutes Intravenous  Once 04/23/18 2238 04/24/18 0239   04/23/18 2245  ceFEPIme (MAXIPIME) 2 g injection  Status:  Discontinued    Note to Pharmacy:  Percell Miller   : cabinet override      04/23/18 2245 04/24/18 0246   04/23/18 2230  ceFEPIme (MAXIPIME) 2 g in sodium chloride 0.9 % 100 mL IVPB     2 g 200 mL/hr over 30 Minutes Intravenous  Once 04/23/18 2229 04/23/18 2330   04/23/18 2230  vancomycin (VANCOCIN) IVPB 1000 mg/200 mL premix  Status:  Discontinued     1,000 mg 200 mL/hr over 60 Minutes Intravenous  Once 04/23/18 2229 04/23/18 2237       Subjective: Doing well, ambulating down the hall, urinating adequately. No SOB.   Objective: Vitals:   05/01/18 2019 05/01/18 2132 05/02/18 0453 05/02/18 0453  BP: (!) 94/53   (!) 116/59  Pulse: 82   80  Resp: 18   16  Temp: 98.7 F (37.1 C)   (!) 97.5 F (36.4 C)  TempSrc: Oral   Oral  SpO2: (!) 89% 93%  91%  Weight:   87 kg (191 lb 12.8 oz)   Height:        Intake/Output Summary (Last 24 hours) at 05/02/2018 1244 Last data filed at 05/02/2018 1143 Gross per 24 hour  Intake 450 ml  Output 1500 ml  Net -1050 ml   Filed Weights   04/30/18 0546 05/01/18 0511 05/02/18 0453  Weight: 88 kg (194 lb) 89.1 kg (196 lb 6.9 oz) 87 kg (191 lb 12.8 oz)    Examination: General exam: Appears calm and comfortable  Respiratory system: Diminished right base. Respiratory effort normal. Cardiovascular  system: S1 & S2 heard, RRR. No JVD, murmurs, rubs, gallops or clicks. No pedal edema. Gastrointestinal system: Abdomen is nondistended, soft and nontender. No organomegaly or masses felt. Normal bowel sounds heard. Central nervous system: Alert and oriented. No focal neurological deficits. Extremities: Symmetric 5 x 5 power. Skin: No rashes, lesions or ulcers Psychiatry: Judgement and insight appear normal. Mood & affect appropriate.    Data Reviewed: I have personally reviewed following labs and imaging studies  CBC: Recent Labs  Lab 04/28/18 0552 04/29/18 0610 04/30/18 0536 05/01/18 0543 05/02/18 0604  WBC 4.6 5.4 5.2 6.2 6.2  HGB 11.7* 12.5 11.7* 13.3 12.1  HCT 36.9 39.8 37.6 40.9 37.5  MCV 93.2 92.6 91.0 91.7 90.8  PLT 156 150 154 160 301   Basic Metabolic Panel: Recent Labs  Lab 04/26/18 0636 04/27/18 6010  04/28/18 3716 04/29/18 0610 04/30/18 0536 05/01/18 0543 05/02/18 0604  NA 138 141 137 137 137 137 135  K 3.6 4.2 4.2 3.9 3.8 4.1 3.7  CL 105 104 104 99* 99* 96* 98*  CO2 26 27 24 27 28 27 26   GLUCOSE 101* 99 98 97 96 98 98  BUN 9 10 10 9 11 10 10   CREATININE 0.51 0.57 0.55 0.69 0.75 0.80 0.77  CALCIUM 8.4* 8.8* 8.8* 9.0 8.8* 9.1 8.7*  MG 2.0 1.7  --   --   --   --   --    GFR: Estimated Creatinine Clearance: 78.8 mL/min (by C-G formula based on SCr of 0.77 mg/dL). Liver Function Tests: No results for input(s): AST, ALT, ALKPHOS, BILITOT, PROT, ALBUMIN in the last 168 hours. No results for input(s): LIPASE, AMYLASE in the last 168 hours. No results for input(s): AMMONIA in the last 168 hours. Coagulation Profile: No results for input(s): INR, PROTIME in the last 168 hours. Cardiac Enzymes: No results for input(s): CKTOTAL, CKMB, CKMBINDEX, TROPONINI in the last 168 hours. BNP (last 3 results) No results for input(s): PROBNP in the last 8760 hours. HbA1C: No results for input(s): HGBA1C in the last 72 hours. CBG: No results for input(s): GLUCAP in the  last 168 hours. Lipid Profile: No results for input(s): CHOL, HDL, LDLCALC, TRIG, CHOLHDL, LDLDIRECT in the last 72 hours. Thyroid Function Tests: No results for input(s): TSH, T4TOTAL, FREET4, T3FREE, THYROIDAB in the last 72 hours. Anemia Panel: No results for input(s): VITAMINB12, FOLATE, FERRITIN, TIBC, IRON, RETICCTPCT in the last 72 hours. Sepsis Labs: No results for input(s): PROCALCITON, LATICACIDVEN in the last 168 hours.  Recent Results (from the past 240 hour(s))  Blood Culture (routine x 2)     Status: None   Collection Time: 04/23/18 10:35 PM  Result Value Ref Range Status   Specimen Description   Final    BLOOD LEFT ARM Performed at [facility], [address] [place], [place] [address]    Special Requests   Final    BOTTLES DRAWN AEROBIC AND ANAEROBIC Blood Culture adequate volume Performed at [facility], [address] [place], [place] [address]    Culture   Final    NO GROWTH 5 DAYS Performed at [facility], [place] [address], [place], [place] [address]    Report Status 04/29/2018 FINAL  Final  Blood Culture (routine x 2)     Status: None   Collection Time: 04/23/18 10:45 PM  Result Value Ref Range Status   Specimen Description   Final    BLOOD RIGHT FOREARM Performed at [facility], [address] [place], [place] [address]    Special Requests   Final    BOTTLES DRAWN AEROBIC AND ANAEROBIC Blood Culture results may not be optimal due to an inadequate volume of blood received in culture bottles Performed at [facility], [address] [place], [place] [address]    Culture   Final    NO GROWTH 5 DAYS Performed at [facility], [place] [address], [place], [place] [address]    Report Status 04/29/2018 FINAL  Final  Culture, body fluid-bottle     Status: None   Collection Time: 04/24/18  8:45 AM  Result Value Ref Range Status   Specimen Description PLEURAL FLUID  Final   Special Requests BOTTLES  DRAWN AEROBIC AND ANAEROBIC  Final   Culture  Final    NO GROWTH 5 DAYS Performed at [facility], [place] [address] [place], [place] [address]    Report Status 04/29/2018 FINAL  Final  Gram stain     Status: None   Collection Time: 04/24/18  8:45 AM  Result Value Ref Range Status   Specimen Description PLEURAL FLUID  Final   Special Requests NONE  Final   Gram Stain   Final    WBC PRESENT,BOTH PMN AND MONONUCLEAR NO ORGANISMS SEEN CYTOSPIN SMEAR Performed at [facility], [address], [place], [place] [address]    Report Status 04/24/2018 FINAL  Final  MRSA PCR Screening     Status: None   Collection Time: 04/24/18 11:06 AM  Result Value Ref Range Status   MRSA by PCR NEGATIVE NEGATIVE Final    Comment:        The GeneXpert MRSA Assay (FDA approved for NASAL specimens only), is one component of a comprehensive MRSA colonization surveillance program. It is not intended to diagnose MRSA infection nor to guide or monitor treatment for MRSA infections. Performed at [facility], [place] [address], [place], [place] [address]   Culture, body fluid-bottle     Status: None   Collection Time: 04/25/18  1:53 PM  Result Value Ref Range Status   Specimen Description PERITONEAL  Final   Special Requests NONE  Final   Culture   Final    NO GROWTH 5 DAYS Performed at [facility], [address], [place], [place] [address]    Report Status 04/30/2018 FINAL  Final  Gram stain     Status: None   Collection Time: 04/25/18  1:53 PM  Result Value Ref Range Status   Specimen Description PERITONEAL  Final   Special Requests NONE  Final   Gram Stain   Final    WBC PRESENT, PREDOMINANTLY MONONUCLEAR NO ORGANISMS SEEN Performed at [facility], [place] [address], [place],  [address]    Report Status 04/25/2018 FINAL  Final       Radiology Studies: Dg Chest 2 View  Result Date: 05/02/2018 CLINICAL DATA:  Cough, shortness of breath,  and right flank pain beginning this morning. EXAM: CHEST - 2 VIEW COMPARISON:  Chest x-ray of Apr 30, 2018 FINDINGS: There is no pneumothorax. There is a moderate-sized right pleural effusion. There is no mediastinal shift. The left lung is clear. The heart and pulmonary vascularity are normal. IMPRESSION: Moderate-sized right pleural effusion slightly more conspicuous than from the study of 2 days ago. No pneumothorax. Electronically Signed   By: David  Martinique M.D.   On: 05/02/2018 08:17      Scheduled Meds: . [START ON 05/03/2018] atenolol  25 mg Oral q morning - 10a  . Eluxadoline  75 mg Oral BID  . enoxaparin (LOVENOX) injection  40 mg Subcutaneous Q24H  . furosemide  80 mg Oral Daily  . pantoprazole  40 mg Oral q morning - 10a  . spironolactone  200 mg Oral Daily   Continuous Infusions:   LOS: 8 days    Time spent: 25 minutes   Dessa Phi, DO [facility] [email or web address] Password [facility] 05/02/2018, 12:44 PM

## 2018-05-02 NOTE — Progress Notes (Addendum)
Progress Note  Patient Name: Dawn Moon Date of Encounter: 05/02/2018  Primary Cardiologist: Dr. Kirk Ruths  Subjective   Pt feeling well today. Up to chair. Denies chest pain or palpitations. Breathing is unlabored. Unfortunately, her CXR today showed increase in pleural effusion from last CXR on 04/30/18. Will need to keep inpatient for at least another day.   Inpatient Medications    Scheduled Meds: . atenolol  50 mg Oral q morning - 10a  . Eluxadoline  75 mg Oral BID  . enoxaparin (LOVENOX) injection  40 mg Subcutaneous Q24H  . furosemide  80 mg Oral Daily  . pantoprazole  40 mg Oral q morning - 10a  . spironolactone  200 mg Oral Daily   Continuous Infusions:  PRN Meds: ondansetron **OR** ondansetron (ZOFRAN) IV, oxyCODONE, promethazine, traMADol   Vital Signs    Vitals:   05/01/18 2019 05/01/18 2132 05/02/18 0453 05/02/18 0453  BP: (!) 94/53   (!) 116/59  Pulse: 82   80  Resp: 18   16  Temp: 98.7 F (37.1 C)   (!) 97.5 F (36.4 C)  TempSrc: Oral   Oral  SpO2: (!) 89% 93%  91%  Weight:   191 lb 12.8 oz (87 kg)   Height:        Intake/Output Summary (Last 24 hours) at 05/02/2018 1012 Last data filed at 05/02/2018 0845 Gross per 24 hour  Intake 450 ml  Output 1300 ml  Net -850 ml   Filed Weights   04/30/18 0546 05/01/18 0511 05/02/18 0453  Weight: 194 lb (88 kg) 196 lb 6.9 oz (89.1 kg) 191 lb 12.8 oz (87 kg)   Physical Exam   General: Well developed, well nourished, NAD Skin: Warm, dry, intact  Head: Normocephalic, atraumatic,  clear, moist mucus membranes. Neck: Negative for carotid bruits. No JVD Lungs: Markedly diminished in all right lung fields. No wheezes, rales, or rhonchi. Breathing is unlabored. Cardiovascular: RRR with S1 S2. No murmurs, rubs or gallops Abdomen: Soft, non-tender, non-distended with normoactive bowel sounds. No obvious abdominal masses. MSK: Strength and tone appear normal for age. 5/5 in all  extremities Extremities: Right leg 2+ edema. No clubbing or cyanosis. DP/PT pulses 1+ bilaterally Neuro: Alert and oriented. No focal deficits. No facial asymmetry. MAE spontaneously. Psych: Responds to questions appropriately with normal affect.    Labs    Chemistry Recent Labs  Lab 04/30/18 0536 05/01/18 0543 05/02/18 0604  NA 137 137 135  K 3.8 4.1 3.7  CL 99* 96* 98*  CO2 28 27 26   GLUCOSE 96 98 98  BUN 11 10 10   CREATININE 0.75 0.80 0.77  CALCIUM 8.8* 9.1 8.7*  GFRNONAA >60 >60 >60  GFRAA >60 >60 >60  ANIONGAP 10 14 11      Hematology Recent Labs  Lab 04/30/18 0536 05/01/18 0543 05/02/18 0604  WBC 5.2 6.2 6.2  RBC 4.13 4.46 4.13  HGB 11.7* 13.3 12.1  HCT 37.6 40.9 37.5  MCV 91.0 91.7 90.8  MCH 28.3 29.8 29.3  MCHC 31.1 32.5 32.3  RDW 15.7* 15.5 15.3  PLT 154 160 152    Cardiac EnzymesNo results for input(s): TROPONINI in the last 168 hours. No results for input(s): TROPIPOC in the last 168 hours.   BNPNo results for input(s): BNP, PROBNP in the last 168 hours.   DDimer No results for input(s): DDIMER in the last 168 hours.   Radiology    Dg Chest 2 View  Result Date: 05/02/2018  CLINICAL DATA:  Cough, shortness of breath, and right flank pain beginning this morning. EXAM: CHEST - 2 VIEW COMPARISON:  Chest x-ray of Apr 30, 2018 FINDINGS: There is no pneumothorax. There is a moderate-sized right pleural effusion. There is no mediastinal shift. The left lung is clear. The heart and pulmonary vascularity are normal. IMPRESSION: Moderate-sized right pleural effusion slightly more conspicuous than from the study of 2 days ago. No pneumothorax. Electronically Signed   By: David  Martinique M.D.   On: 05/02/2018 08:17   Dg Chest Port 1 View  Result Date: 04/30/2018 CLINICAL DATA:  Recurrent right pleural effusion EXAM: PORTABLE CHEST 1 VIEW COMPARISON:  04/28/2018 FINDINGS: There is a small right pleural effusion decreased compared with 04/28/2018. There is no left  pleural effusion. There is no focal consolidation. There is no pneumothorax. The osseous structures are unremarkable. IMPRESSION: Small right pleural effusion. Electronically Signed   By: Kathreen Devoid   On: 04/30/2018 13:15   Telemetry    05/02/18 NSR with HR 63 - Personally Reviewed  ECG    04/23/18 NSR NR 102 - Personally Reviewed  Cardiac Studies   Echocardiogram 04/25/2018: Study Conclusions  - Left ventricle: The cavity size was normal. Wall thickness was normal. Systolic function was normal. The estimated ejection fraction was in the range of 60% to 65%. Wall motion was normal; there were no regional wall motion abnormalities. There was no evidence of elevated ventricular filling pressure by Doppler parameters. - Left atrium: The atrium was mildly dilated.  Patient Profile     62 y.o. female with a history of hypertension, NSVT, NASH cirrhosis, IBS, HTN and recent right knee replacement who presented  with large right pleural effusion and ascites. She is status post thoracentesis and continues on IV diuresis.  Assessment & Plan    1. Hypoxic respiratory failure in the setting of large pleural effusion: -Secondary to right pleural effusion>>>s/p multiple thoracentesis 04/24/18 and 04/28/18 with total of 3.9L out. Last follow up CXR 04/30/18 with small residual rigth pleural effusion with improvement compared to prior films. Unfortunately, CXR from today, 05/02/18 revealed moderate-sized right pleural effusion slightly more conspicuous than from the study of 2 days ago.  -Echo with LVEF 60-65% without evidence of diastolic dysfunction  -Lasix transistioned from IV to PO 87m daily per primary team, aldactone at 2047mPO QD -Weight, 191lb today, 196lb on 05/01/18 -I&O, net negative 13.1L since admission, total 24H output 1.8L -Creatinine stable at 0.77 today with Lasix therapy  -Oxygen demand improved>>now on [place] and up to chair without complication   2. NASH  cirrhosis with ascites: -s/p paracentesis on 04/26/18 with 2.9L of fluid removal -GI following  3. S/p right knee replacement: -Surgery occurred  03/2018 doing well with PT -Ambulating   4. HTN: -Stable, 116/59>94/53>30/60 -Would not increase antihypertensives at this point>>> holding AM dose of atenolol secondary to mild hypotension while fluid volume shifts stabilize. RN to page with next BP  -Appears variable on position and activity>>she is asymptomatic   5. NSVT: -Followed by Dr. KlCaryl Comeslast seen on 11/19/2016 -Hom atenolol for rate control  Signed, JiKathyrn DrownP-C He[place]burgager: [phone]/13/2019, 10:12 AM   For questions or updates, please contact   Please consult [email or web address] for contact info under Cardiology/STEMI.  As above, patient seen.  Patient denies chest pain today.  She is status post thoracentesis and paracentesis during this admission.  Etiology of effusions and ascites felt secondary to cirrhosis.  Continue diuretics at present dose. I/O-14000  since admission. Follow renal function.  Gastroenterology is following.  Note LV function is normal.  Note patient's blood pressure is soft.  Decrease atenolol to 25 mg daily and follow.  May need to discontinue if blood pressure continues to be borderline.  Kirk Ruths, MD

## 2018-05-02 NOTE — Progress Notes (Signed)
Physical Therapy Treatment Patient Details Name: Dawn Moon MRN: [id number] DOB: 1956-09-23 Today's Date: 05/02/2018    History of Present Illness 62 yo female s/p RTKA 03/24/18 , HH/O NASH admitted 04/23/18 with SOM. found to have right pleural effusion with atelectasis, large volume ascites. S/P thoracentesis x 2  and paracentesis    PT Comments    The patient reports feeling tired today. Began resistive exercises for right knee flexion and  Extension with theraband and provided the LEG peddler to room for patient to work on ad lib. Continue PT.   Follow Up Recommendations  Outpatient PT(if able)     Equipment Recommendations  None recommended by PT    Recommendations for Other Services       Precautions / Restrictions Precautions Precautions: Fall;Knee    Mobility  Bed Mobility Overal bed mobility: Independent                Transfers Overall transfer level: Independent                  Ambulation/Gait             General Gait Details: did not ambulate after increased exercise program initiated., ambulating with family without RW. up ad lib to BR.   Stairs             Wheelchair Mobility    Modified Rankin (Stroke Patients Only)       Balance                                            Cognition Arousal/Alertness: Awake/alert                                            Exercises Total Joint Exercises Quad Sets: AROM;Other reps (comment);Left(30) Short Arc Quad: Strengthening;Other reps (comment);Supine;Other (comment);Right(resistive with yellow TB ) Heel Slides: AROM;Right;Other reps (comment) Straight Leg Raises: AROM;Other reps (comment) Long Arc Quad: (resistive with yellow TB) Knee Flexion: (resistive with yellow TB) Goniometric ROM: 5-120 PROM right Other Exercises Other Exercises: provided leg peddler and performed x 4 minutes. HR 94    General Comments        Pertinent  Vitals/Pain Faces Pain Scale: Hurts a little bit Pain Location: right knee Pain Descriptors / Indicators: Sore;Tightness Pain Intervention(s): Monitored during session;Ice applied    Home Living                      Prior Function            PT Goals (current goals can now be found in the care plan section) Progress towards PT goals: Progressing toward goals    Frequency    Min 3X/week      PT Plan Current plan remains appropriate    Co-evaluation              AM-PAC PT "6 Clicks" Daily Activity  Outcome Measure  Difficulty turning over in bed (including adjusting bedclothes, sheets and blankets)?: None Difficulty moving from lying on back to sitting on the side of the bed? : None Difficulty sitting down on and standing up from a chair with arms (e.g., wheelchair, bedside commode, etc,.)?: None Help needed moving to and from a bed to chair (  including a wheelchair)?: None Help needed walking in hospital room?: A Little Help needed climbing 3-5 steps with a railing? : A Lot 6 Click Score: 21    End of Session   Activity Tolerance: Patient tolerated treatment well Patient left: in bed;with call bell/phone within reach Nurse Communication: Mobility status PT Visit Diagnosis: Unsteadiness on feet (R26.81)     Time: 4801-6553 PT Time Calculation (min) (ACUTE ONLY): 48 min  Charges:  $Therapeutic Exercise: 38-52 mins                    G CodesTresa Endo PT [phone]    Claretha Cooper 05/02/2018, 3:53 PM

## 2018-05-02 NOTE — Telephone Encounter (Signed)
Pt's daughter Clovia Cuff) dropped off Disability paperwork from [place]. States that pt is currently in the hospital and the paperwork was supposed to be faxed over on 04/29/18 and pt was supposed to get a MyChart message to confirm it was received and completed. Asked that the paperwork be completed and the pt be informed upon completion. Placed in front bin with charge sheet.

## 2018-05-03 ENCOUNTER — Encounter: Payer: Self-pay | Admitting: Internal Medicine

## 2018-05-03 ENCOUNTER — Telehealth: Payer: Self-pay | Admitting: Internal Medicine

## 2018-05-03 ENCOUNTER — Inpatient Hospital Stay (HOSPITAL_COMMUNITY): Payer: 59

## 2018-05-03 LAB — BASIC METABOLIC PANEL
ANION GAP: 12 (ref 5–15)
BUN: 10 mg/dL (ref 6–20)
CHLORIDE: 97 mmol/L — AB (ref 101–111)
CO2: 25 mmol/L (ref 22–32)
CREATININE: 0.86 mg/dL (ref 0.44–1.00)
Calcium: 8.7 mg/dL — ABNORMAL LOW (ref 8.9–10.3)
GFR calc Af Amer: >60 mL/min (ref >60)
GFR calc non Af Amer: >60 mL/min (ref >60)
Glucose, Bld: 101 mg/dL — ABNORMAL HIGH (ref 65–99)
POTASSIUM: 3.7 mmol/L (ref 3.5–5.1)
SODIUM: 134 mmol/L — AB (ref 135–145)

## 2018-05-03 MED ORDER — SPIRONOLACTONE 100 MG PO TABS: 200.0000 mg | ORAL_TABLET | Freq: Every day | ORAL | 0 refills | Status: DC

## 2018-05-03 MED ORDER — ATENOLOL 25 MG PO TABS
25.0000 mg | ORAL_TABLET | Freq: Every morning | ORAL | 0 refills | Status: DC
Start: 2018-05-03 — End: 2018-05-19

## 2018-05-03 MED ORDER — FUROSEMIDE 80 MG PO TABS: 80.0000 mg | ORAL_TABLET | Freq: Every day | ORAL | 0 refills | Status: DC

## 2018-05-03 MED ORDER — PANTOPRAZOLE SODIUM 40 MG PO TBEC: 40.0000 mg | DELAYED_RELEASE_TABLET | Freq: Every morning | ORAL | 0 refills | Status: DC

## 2018-05-03 NOTE — Telephone Encounter (Signed)
Patient has been scheduled with [name], PA for 05/18/18 11:00.  Appt arranged with Sherri Rad

## 2018-05-03 NOTE — Discharge Summary (Signed)
Physician Discharge Summary  Dawn Moon [id number] DOB: December 04, 1956 DOA: 04/23/2018  PCP: Leamon Arnt, MD  Admit date: 04/23/2018 Discharge date: 05/03/2018  Admitted From: Home Disposition:  Home  Recommendations for Outpatient Follow-up:  1. Follow up with PCP in 1 week 2. Follow up with Cardiology in 1 week 3. Follow up with GI in 1 week  4. Recommend repeat CXR in 1 week   Discharge Condition: Stable CODE STATUS: Full  Diet recommendation: Heart healthy   Brief/Interim Summary: Dawn Moon a 62 year old female with IBS, paroxysmal SVT, hypertension, NASH who underwent a right knee replacement on 4/4 and was discharged from the hospital on 4/5. She presents for shortness of breath steadily worsening since her surgery. She had doubled her Lasix to 40 mg daily because her right leg has been swollen as well. She became suddenly more short of breath and presented to the ER. CTA was done to rule out PE and showed large right-sided pleural effusion with with atelectasis of the right lower lobe and partial atelectasis of the right mid and upper lobes, lobulated liver with enlarged left lobecompatible with cirrhosis, large volume ascites and mild enlargement of mesenteric lymph nodes. She was admitted to the stepdown unit and underwent right thoracentesis 5/5 as well as paracentesis 5/7.  Despite diuresis, patient developed recurrent right-sided pleural effusion and underwent second right thoracentesis 5/9.  She was treated with IV diuretics which were transitioned to p.o.  She continues to diurese well.  Serial chest x-rays continued to reveal moderate right-sided pleural effusion.  On day of discharge, patient was off oxygen, denied any shortness of breath.  Chest x-ray was repeated which showed moderate yet stable right-sided pleural effusion.  She will be discharged home on oral diuretics and to follow-up closely with PCP, GI, cardiology.  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 -Echo EF 60-65% without evidence of diastolic dysfunction, cardiology following  -Repeat CXR 5/9 showed increasing right-sided pleural effusion with complete opacification of the right hemithorax -S/p right thoracentesis 5/9 with removal of 1.9 L of fluid.  Postthoracentesis chest x-ray showed moderate residual pleural effusion on right -Repeat CXR 5/11 showed small right pleural effusion  -Repeat CXR 5/13 showed moderate right pleural effusion, reviewed independently and appears only mildly progressed from previous CXR  -Lasix PO 31m QD and Aldactone to 2029mQD -Continues to diurese well and now off [place] O2   Cirrhosis secondary to NASH with ascites  -S/p paracentesis 5/7 with removal of 2.9 L, culture negative to date  -GI following   Right leg edema -Occurring in relation to right knee surgery early April  -Venous duplex negative   Essential hypertension -Continue atenolol, dose decreased    NSVT (nonsustained ventricular tachycardia) -Was started on atenolol by Dr. KlCaryl ComesIBS (irritable bowel syndrome) -Cont Eluxadoline as prescribed by GI  GERD -Continue Protonix   Discharge Instructions  Discharge Instructions    Call MD for:  difficulty breathing, headache or visual disturbances   Complete by:  As directed    Call MD for:   extreme fatigue   Complete by:  As directed    Call MD for:  hives   Complete by:  As directed    Call MD for:  persistant dizziness or light-headedness   Complete by:  As directed    Call MD for:  persistant nausea and vomiting   Complete by:  As directed    Call MD for:  severe uncontrolled pain   Complete by:  As directed    Call MD for:  temperature >100.4   Complete by:  As directed    Diet - low sodium heart healthy   Complete by:  As directed    Discharge instructions   Complete by:  As directed    You were cared for by a hospitalist during your hospital stay. If you have any questions about your discharge medications or the care you received while you were in the hospital after you are discharged, you can call the unit and ask to speak with the hospitalist on call if the hospitalist that took care of you is not available. Once you are discharged, your primary care physician will handle any further medical issues. Please note that NO REFILLS for any discharge medications will be authorized once you are discharged, as it is imperative that you return to your primary care physician (or establish a relationship with a primary care physician if you do not have one) for your aftercare needs so that they can reassess your need for medications and monitor your lab values.   Increase activity slowly   Complete by:  As directed      Allergies as of 05/03/2018   No Known Allergies     Medication List    STOP taking these medications   potassium citrate 10 MEQ (1080 MG) SR tablet Commonly known as:  UROCIT-K     TAKE these medications   aspirin 81 MG chewable tablet Chew 1 tablet (81 mg total) by mouth 2 (two) times daily.   atenolol 25 MG tablet Commonly known as:  TENORMIN Take 1 tablet (25 mg total) by mouth every morning. What changed:    medication strength  how much to take   Butalbital-APAP-Caffeine 50-300-40 MG Caps Take 1 tablet by mouth every 8 (eight) hours as  needed. What changed:  reasons to take this   Eluxadoline 75 MG Tabs Commonly known as:  VIBERZI Take 75 mg by mouth 2 (two) times daily.   furosemide 80 MG tablet Commonly known as:  LASIX Take 1 tablet (80 mg total) by mouth daily. Start taking on:  05/04/2018 What changed:    medication strength  how much to take  when to take this  additional instructions   pantoprazole 40 MG tablet Commonly known as:  PROTONIX Take 1 tablet (40 mg total) by mouth every morning.   spironolactone 100 MG tablet Commonly known as:  ALDACTONE Take 2 tablets (200 mg total) by mouth daily. Start taking on:  05/04/2018      Follow-up Information    Leamon Arnt, MD. Schedule an appointment as soon as possible for a visit in 1 week(s).   Specialty:  Family Medicine Contact information: [address] [place] [address] [phone]        Lelon Perla, MD. Schedule an appointment as soon as possible for  a visit in 1 week(s).   Specialty:  Cardiology Contact information: [address] [place] [address] [phone]        Gatha Mayer, MD. Schedule an appointment as soon as possible for a visit in 1 week(s).   Specialty:  Gastroenterology Contact information: [address] [address] [phone]          No Known Allergies  Consultations:  GI  Cardiology    Procedures/Studies: Dg Chest 1 View  Result Date: 04/28/2018 CLINICAL DATA:  Status post right thoracentesis. EXAM: CHEST  1 VIEW COMPARISON:  04/28/2018 at 10:12 a.m. FINDINGS: Since the prior exam, there has been a reduction in the right-sided pleural effusion. There is still opacity that extends from the right mid to lower lung obscuring the right heart border and right hemidiaphragm consistent with residual pleural fluid and either pneumonia or atelectasis. No pneumothorax. Left lung remains clear.  No left pleural effusion. IMPRESSION: 1. Significant decrease in  right pleural fluid following thoracentesis. No pneumothorax. 2. Moderate residual pleural effusion with associated lung base opacity consistent with atelectasis or pneumonia. Electronically Signed   By: Lajean Manes M.D.   On: 04/28/2018 16:12   Dg Chest 1 View  Result Date: 04/24/2018 CLINICAL DATA:  Status post right thoracentesis. EXAM: CHEST  1 VIEW COMPARISON:  04/23/2018 FINDINGS: Significant reduction in right pleural fluid volume with underlying airspace disease of the right lower lung representing either pneumonia or residual atelectasis. No pneumothorax. No pulmonary edema. Stable mild cardiac enlargement. IMPRESSION: No pneumothorax with significant reduction in right pleural fluid volume after thoracentesis. Underlying pneumonia versus atelectasis of the right lower lung. Electronically Signed   By: Aletta Edouard M.D.   On: 04/24/2018 09:13   Dg Chest 2 View  Result Date: 05/03/2018 CLINICAL DATA:  Follow-up right pleural effusion. EXAM: CHEST - 2 VIEW COMPARISON:  Chest x-ray of May 02, 2018 FINDINGS: There is a moderate-sized right pleural effusion unchanged from yesterday's study. It occupies approximately 1/3 of the pleural space volume. There is no pleural effusion on the left. The aerated portion of the right lung and the entire left lung exhibit mild chronically increased interstitial markings. The heart is top-normal in size. The pulmonary vascularity is normal. The trachea is midline. The bony thorax exhibits no acute abnormality. IMPRESSION: Stable moderate-sized right sided pleural effusion. Probable underlying chronic bronchitic changes. Electronically Signed   By: David  Martinique M.D.   On: 05/03/2018 11:54   Dg Chest 2 View  Result Date: 05/02/2018 CLINICAL DATA:  Cough, shortness of breath, and right flank pain beginning this morning. EXAM: CHEST - 2 VIEW COMPARISON:  Chest x-ray of Apr 30, 2018 FINDINGS: There is no pneumothorax. There is a moderate-sized right pleural  effusion. There is no mediastinal shift. The left lung is clear. The heart and pulmonary vascularity are normal. IMPRESSION: Moderate-sized right pleural effusion slightly more conspicuous than from the study of 2 days ago. No pneumothorax. Electronically Signed   By: David  Martinique M.D.   On: 05/02/2018 08:17   Dg Chest 2 View  Result Date: 04/28/2018 CLINICAL DATA:  62 year old female with a history of increased shortness of breath, with recurrent pleural effusion and cirrhosis EXAM: CHEST - 2 VIEW COMPARISON:  04/26/2018, 04/24/2018 FINDINGS: Cardiomediastinal silhouette likely unchanged, with the right heart border partially obscured by overlying lung and pleural disease. Increasing opacity of the right chest with complete opacification no residual aeration. Left lung relatively well aerated. IMPRESSION: Increasing right-sided  pleural effusion with complete opacification of the right hemithorax in this patient with known hepatic hydrothorax. If a candidate, referral for TIPS evaluation may be considered. Electronically Signed   By: Corrie Mckusick D.O.   On: 04/28/2018 12:14   Dg Chest 2 View  Result Date: 04/26/2018 CLINICAL DATA:  Follow-up right pleural effusion EXAM: CHEST - 2 VIEW COMPARISON:  04/24/2018 FINDINGS: There is been significant increase in the degree of right-sided pleural effusion when compare with the prior exam. Cardiac shadow remains enlarged. Left lung remains clear. IMPRESSION: Significant recurrent right-sided pleural effusion greater than that seen on the pre-thoracentesis images. Electronically Signed   By: Inez Catalina M.D.   On: 04/26/2018 11:05   Dg Chest 2 View  Result Date: 04/23/2018 CLINICAL DATA:  62 year old female with acute onset of cough and right-sided chest pain and shortness of breath. EXAM: CHEST - 2 VIEW COMPARISON:  Chest radiograph dated 01/20/2018 FINDINGS: There is a moderate size right-sided pleural effusion, new compared to the prior radiograph. There is  associated compressive atelectasis of the majority of the right lower and right middle lobes. There is a small aerated portion of the lung in the right upper lobe. The left lung is clear. There is no pneumothorax. The right cardiac borders are silhouetted. No acute osseous pathology. IMPRESSION: Moderate right pleural effusion with compressive atelectasis of the majority of the right lung, new compared to the prior radiograph. Findings may be related to an infectious etiology although underlying mass is not entirely excluded. Clinical correlation and follow-up to resolution recommended. CT may provide additional evaluation if thoracentesis is considered for symptom relief. Electronically Signed   By: Anner Crete M.D.   On: 04/23/2018 21:36   Ct Angio Chest Pe W/cm &/or Wo Cm  Result Date: 04/23/2018 CLINICAL DATA:  62 y/o F; shortness of breath and abdominal pain with bloating. Knee surgery 1 month ago. EXAM: CT ANGIOGRAPHY CHEST CT ABDOMEN AND PELVIS WITH CONTRAST TECHNIQUE: Multidetector CT imaging of the chest was performed using the standard protocol during bolus administration of intravenous contrast. Multiplanar CT image reconstructions and MIPs were obtained to evaluate the vascular anatomy. Multidetector CT imaging of the abdomen and pelvis was performed using the standard protocol during bolus administration of intravenous contrast. CONTRAST:  22m ISOVUE-370 IOPAMIDOL (ISOVUE-370) INJECTION 76% COMPARISON:  06/10/2011 CT abdomen and pelvis. FINDINGS: CTA CHEST FINDINGS Cardiovascular: Satisfactory opacification of pulmonary arteries. Respiratory motion artifact. No pulmonary embolus identified. Mild coronary artery calcification. Normal heart size. No pericardial effusion. Normal caliber thoracic aorta. Mediastinum/Nodes: No enlarged mediastinal, hilar, or axillary lymph nodes. Thyroid gland, trachea, and esophagus demonstrate no significant findings. Lungs/Pleura: Large right pleural effusion with  atelectasis of right lower lobe and partial atelectasis of the right upper lobes and middle lobe. Clear left lung. Musculoskeletal: No chest wall abnormality. No acute or significant osseous findings. Review of the MIP images confirms the above findings. CT ABDOMEN and PELVIS FINDINGS Hepatobiliary: Lobulation of liver contour with enlargement of the left lobe compatible with cirrhosis. Normal gallbladder. No biliary ductal dilatation. Pancreas: Unremarkable. No pancreatic ductal dilatation or surrounding inflammatory changes. Spleen: Normal in size without focal abnormality. Adrenals/Urinary Tract: Adrenal glands are unremarkable. Kidneys are normal, without renal calculi, focal lesion, or hydronephrosis. Bladder is unremarkable. Stomach/Bowel: Stomach is within normal limits. Appendix appears normal. No evidence of bowel wall thickening, distention, or inflammatory changes. Vascular/Lymphatic: No significant vascular findings are present. Enlargement of mesenteric lymph nodes measuring up to 18 x 14 mm (series 13, image  41). Reproductive: Uterus and bilateral adnexa are unremarkable. Other: Large volume of ascites. Musculoskeletal: No fracture is seen. Stable L1 mild anterior compression deformity. Review of the MIP images confirms the above findings. IMPRESSION: 1. Respiratory motion artifact.  No pulmonary embolus identified. 2. Large right pleural effusion with atelectasis of right lower lobe and partial atelectasis of right upper and middle lobes. 3. Lobulated liver with enlarged left lobe compatible with cirrhosis. 4. Large volume of ascites. 5. Mild enlargement of mesenteric lymph nodes may represent underlying inflammation or be due to venous congestion. Electronically Signed   By: Kristine Garbe M.D.   On: 04/23/2018 22:12   Ct Abdomen Pelvis W Contrast  Result Date: 04/23/2018 CLINICAL DATA:  62 y/o F; shortness of breath and abdominal pain with bloating. Knee surgery 1 month ago. EXAM: CT  ANGIOGRAPHY CHEST CT ABDOMEN AND PELVIS WITH CONTRAST TECHNIQUE: Multidetector CT imaging of the chest was performed using the standard protocol during bolus administration of intravenous contrast. Multiplanar CT image reconstructions and MIPs were obtained to evaluate the vascular anatomy. Multidetector CT imaging of the abdomen and pelvis was performed using the standard protocol during bolus administration of intravenous contrast. CONTRAST:  64m ISOVUE-370 IOPAMIDOL (ISOVUE-370) INJECTION 76% COMPARISON:  06/10/2011 CT abdomen and pelvis. FINDINGS: CTA CHEST FINDINGS Cardiovascular: Satisfactory opacification of pulmonary arteries. Respiratory motion artifact. No pulmonary embolus identified. Mild coronary artery calcification. Normal heart size. No pericardial effusion. Normal caliber thoracic aorta. Mediastinum/Nodes: No enlarged mediastinal, hilar, or axillary lymph nodes. Thyroid gland, trachea, and esophagus demonstrate no significant findings. Lungs/Pleura: Large right pleural effusion with atelectasis of right lower lobe and partial atelectasis of the right upper lobes and middle lobe. Clear left lung. Musculoskeletal: No chest wall abnormality. No acute or significant osseous findings. Review of the MIP images confirms the above findings. CT ABDOMEN and PELVIS FINDINGS Hepatobiliary: Lobulation of liver contour with enlargement of the left lobe compatible with cirrhosis. Normal gallbladder. No biliary ductal dilatation. Pancreas: Unremarkable. No pancreatic ductal dilatation or surrounding inflammatory changes. Spleen: Normal in size without focal abnormality. Adrenals/Urinary Tract: Adrenal glands are unremarkable. Kidneys are normal, without renal calculi, focal lesion, or hydronephrosis. Bladder is unremarkable. Stomach/Bowel: Stomach is within normal limits. Appendix appears normal. No evidence of bowel wall thickening, distention, or inflammatory changes. Vascular/Lymphatic: No significant vascular  findings are present. Enlargement of mesenteric lymph nodes measuring up to 18 x 14 mm (series 13, image 41). Reproductive: Uterus and bilateral adnexa are unremarkable. Other: Large volume of ascites. Musculoskeletal: No fracture is seen. Stable L1 mild anterior compression deformity. Review of the MIP images confirms the above findings. IMPRESSION: 1. Respiratory motion artifact.  No pulmonary embolus identified. 2. Large right pleural effusion with atelectasis of right lower lobe and partial atelectasis of right upper and middle lobes. 3. Lobulated liver with enlarged left lobe compatible with cirrhosis. 4. Large volume of ascites. 5. Mild enlargement of mesenteric lymph nodes may represent underlying inflammation or be due to venous congestion. Electronically Signed   By: LKristine GarbeM.D.   On: 04/23/2018 22:12   [place]acent[place]  Result Date: 04/25/2018 INDICATION: Patient with history of NASH cirrhosis, ascites. Request made for diagnostic and therapeutic paracentesis. EXAM: ULTRASOUND GUIDED DIAGNOSTIC AND THERAPEUTIC PARACENTESIS MEDICATIONS: None COMPLICATIONS: None immediate. PROCEDURE: Informed written consent was obtained from the patient after a discussion of the risks, benefits and alternatives to treatment. A timeout was performed prior to the initiation of the procedure. Initial ultrasound scanning demonstrates a small to moderate amount of ascites  within the right lower abdominal quadrant. The right lower abdomen was prepped and draped in the usual sterile fashion. 1% lidocaine was used for local anesthesia. Following this, a 6 Fr Safe-T-Centesis catheter was introduced. An ultrasound image was saved for documentation purposes. The paracentesis was performed. The catheter was removed and a dressing was applied. The patient tolerated the procedure well without immediate post procedural complication. FINDINGS: A total of approximately 2.9 liters of hazy, amber fluid was removed. Samples  were sent to the laboratory as requested by the clinical team. IMPRESSION: Successful ultrasound-guided diagnostic and therapeutic paracentesis yielding 2.9 liters of peritoneal fluid. Read by: Rowe Robert, PA-C Electronically Signed   By: Lucrezia Europe M.D.   On: 04/25/2018 14:19   Dg Chest Port 1 View  Result Date: 04/30/2018 CLINICAL DATA:  Recurrent right pleural effusion EXAM: PORTABLE CHEST 1 VIEW COMPARISON:  04/28/2018 FINDINGS: There is a small right pleural effusion decreased compared with 04/28/2018. There is no left pleural effusion. There is no focal consolidation. There is no pneumothorax. The osseous structures are unremarkable. IMPRESSION: Small right pleural effusion. Electronically Signed   By: Kathreen Devoid   On: 04/30/2018 13:15   US Thoracentesis Asp Pleural Space W/img Guide  Result Date: 04/28/2018 INDICATION: NASH cirrhosis, dyspnea, recurrent right pleural effusion. Request made for therapeutic right thoracentesis. EXAM: ULTRASOUND GUIDED THERAPEUTIC RIGHT THORACENTESIS MEDICATIONS: None COMPLICATIONS: None immediate. PROCEDURE: An ultrasound guided thoracentesis was thoroughly discussed with the patient and questions answered. The benefits, risks, alternatives and complications were also discussed. The patient understands and wishes to proceed with the procedure. Written consent was obtained. Ultrasound was performed to localize and mark an adequate pocket of fluid in the right chest. The area was then prepped and draped in the normal sterile fashion. 1% Lidocaine was used for local anesthesia. Under ultrasound guidance a 6 Fr Safe-T-Centesis catheter was introduced. Thoracentesis was performed. The catheter was removed and a dressing applied. FINDINGS: A total of approximately 1.9 liters of turbid, amber fluid was removed. IMPRESSION: Successful ultrasound guided therapeutic right thoracentesis yielding 1.9 liters of pleural fluid. Read by: Rowe Robert, PA-C Electronically Signed    By: Sandi Mariscal M.D.   On: 04/28/2018 15:51   US Thoracentesis Asp Pleural Space W/img Guide  Result Date: 04/24/2018 INDICATION: History of NASH cirrhosis. Shortness of breath. Right pleural effusion. Request for diagnostic and therapeutic thoracentesis. EXAM: ULTRASOUND GUIDED RIGHT THORACENTESIS MEDICATIONS: 1% Lidocaine = 10 mL COMPLICATIONS: None immediate. PROCEDURE: An ultrasound guided thoracentesis was thoroughly discussed with the patient and questions answered. The benefits, risks, alternatives and complications were also discussed. The patient understands and wishes to proceed with the procedure. Written consent was obtained. Ultrasound was performed to localize and mark an adequate pocket of fluid in the right chest. The area was then prepped and draped in the normal sterile fashion. 1% Lidocaine was used for local anesthesia. Under ultrasound guidance a 6 Fr Safe-T-Centesis catheter was introduced. Thoracentesis was performed. The catheter was removed and a dressing applied. FINDINGS: A total of approximately 2 liters of cloudy yellow fluid was removed. Samples were sent to the laboratory as requested by the clinical team. IMPRESSION: Successful ultrasound guided right thoracentesis yielding 2 liters of pleural fluid. No pneumothorax on post procedure chest X-ray. Read by: Gareth Eagle, PA-C Electronically Signed   By: Aletta Edouard M.D.   On: 04/24/2018 12:56    Echo Study Conclusions  - Left ventricle: The cavity size was normal. Wall thickness was   normal. Systolic  function was normal. The estimated ejection   fraction was in the range of 60% to 65%. Wall motion was normal;   there were no regional wall motion abnormalities. There was no   evidence of elevated ventricular filling pressure by Doppler   parameters. - Left atrium: The atrium was mildly dilated.   LE Duplex Final Interpretation: Right: There is no evidence of deep vein thrombosis in the lower extremity. However,  portions of this examination were limited- see technologist comments above. No cystic structure found in the popliteal fossa. Left: No evidence of common femoral vein obstruction.   Discharge Exam: Vitals:   05/02/18 2005 05/03/18 0453  BP: 121/70 118/64  Pulse: 84 87  Resp: 20 15  Temp: 98.4 F (36.9 C) 98.3 F (36.8 C)  SpO2: 95% 94%    General: Pt is alert, awake, not in acute distress Cardiovascular: RRR, S1/S2 +, no rubs, no gallops Respiratory: Diminished breath sound on right base, no wheezing, no rhonchi Abdominal: Soft, NT, ND, bowel sounds + Extremities: no edema, no cyanosis    The results of significant diagnostics from this hospitalization (including imaging, microbiology, ancillary and laboratory) are listed below for reference.     Microbiology: Recent Results (from the past 240 hour(s))  Blood Culture (routine x 2)     Status: None   Collection Time: 04/23/18 10:35 PM  Result Value Ref Range Status   Specimen Description   Final    BLOOD LEFT ARM Performed at [facility], [address] [place], [place] [address]    Special Requests   Final    BOTTLES DRAWN AEROBIC AND ANAEROBIC Blood Culture adequate volume Performed at [facility], [address] [place], [place] [address]    Culture   Final    NO GROWTH 5 DAYS Performed at [facility], [place] [address], [place], [place] [address]    Report Status 04/29/2018 FINAL  Final  Blood Culture (routine x 2)     Status: None   Collection Time: 04/23/18 10:45 PM  Result Value Ref Range Status   Specimen Description   Final    BLOOD RIGHT FOREARM Performed at [facility], [address] [place], [place] [address]    Special Requests   Final    BOTTLES DRAWN AEROBIC AND ANAEROBIC Blood Culture results may not be optimal due to an inadequate volume of blood received in culture bottles Performed at [facility], [address] [place], [place]  [address]    Culture   Final    NO GROWTH 5 DAYS Performed at [facility], [place] [address], [place], [place] [address]    Report Status 04/29/2018 FINAL  Final  Culture, body fluid-bottle     Status: None   Collection Time: 04/24/18  8:45 AM  Result Value Ref Range Status   Specimen Description PLEURAL FLUID  Final   Special Requests BOTTLES DRAWN AEROBIC AND ANAEROBIC  Final   Culture   Final    NO GROWTH 5 DAYS Performed at [facility], [place] [address], [place], [place] [address]    Report Status 04/29/2018 FINAL  Final  Gram stain     Status: None   Collection Time: 04/24/18  8:45 AM  Result Value Ref Range Status   Specimen Description PLEURAL FLUID  Final   Special Requests NONE  Final   Gram Stain   Final  WBC PRESENT,BOTH PMN AND MONONUCLEAR NO ORGANISMS SEEN CYTOSPIN SMEAR Performed at [facility], [place] [address], [place], [place] [address]    Report Status 04/24/2018 FINAL  Final  MRSA PCR Screening     Status: None   Collection Time: 04/24/18 11:06 AM  Result Value Ref Range Status   MRSA by PCR NEGATIVE NEGATIVE Final    Comment:        The GeneXpert MRSA Assay (FDA approved for NASAL specimens only), is one component of a comprehensive MRSA colonization surveillance program. It is not intended to diagnose MRSA infection nor to guide or monitor treatment for MRSA infections. Performed at [facility], [place] [address] [place], [place] [address]   Culture, body fluid-bottle     Status: None   Collection Time: 04/25/18  1:53 PM  Result Value Ref Range Status   Specimen Description PERITONEAL  Final   Special Requests NONE  Final   Culture   Final    NO GROWTH 5 DAYS Performed at [facility], [address] [place], [place] [address]    Report Status 04/30/2018 FINAL  Final  Gram stain     Status: None   Collection Time: 04/25/18  1:53 PM  Result Value Ref Range Status   Specimen Description PERITONEAL  Final    Special Requests NONE  Final   Gram Stain   Final    WBC PRESENT, PREDOMINANTLY MONONUCLEAR NO ORGANISMS SEEN Performed at [facility], [place] [address], [place], [place] [address]    Report Status 04/25/2018 FINAL  Final     Labs: BNP (last 3 results) No results for input(s): BNP in the last 8760 hours. Basic Metabolic Panel: Recent Labs  Lab 04/27/18 0634  04/29/18 0610 04/30/18 0536 05/01/18 0543 05/02/18 0604 05/03/18 0539  NA 141   < > 137 137 137 135 134*  K 4.2   < > 3.9 3.8 4.1 3.7 3.7  CL 104   < > 99* 99* 96* 98* 97*  CO2 27   < > 27 28 27 26 25   GLUCOSE 99   < > 97 96 98 98 101*  BUN 10   < > 9 11 10 10 10   CREATININE 0.57   < > 0.69 0.75 0.80 0.77 0.86  CALCIUM 8.8*   < > 9.0 8.8* 9.1 8.7* 8.7*  MG 1.7  --   --   --   --   --   --    < > = values in this interval not displayed.   Liver Function Tests: No results for input(s): AST, ALT, ALKPHOS, BILITOT, PROT, ALBUMIN in the last 168 hours. No results for input(s): LIPASE, AMYLASE in the last 168 hours. No results for input(s): AMMONIA in the last 168 hours. CBC: Recent Labs  Lab 04/28/18 0552 04/29/18 0610 04/30/18 0536 05/01/18 0543 05/02/18 0604  WBC 4.6 5.4 5.2 6.2 6.2  HGB 11.7* 12.5 11.7* 13.3 12.1  HCT 36.9 39.8 37.6 40.9 37.5  MCV 93.2 92.6 91.0 91.7 90.8  PLT 156 150 154 160 152   Cardiac Enzymes: No results for input(s): CKTOTAL, CKMB, CKMBINDEX, TROPONINI in the last 168 hours. BNP: Invalid input(s): POCBNP CBG: No results for input(s): GLUCAP in the last 168 hours. D-Dimer No results for input(s): DDIMER in the last 72 hours. Hgb A1c No results for input(s): HGBA1C in the last 72 hours. Lipid Profile No results for input(s): CHOL, HDL, LDLCALC, TRIG, CHOLHDL, LDLDIRECT  in the last 72 hours. Thyroid function studies No results for input(s): TSH, T4TOTAL, T3FREE, THYROIDAB in the last 72 hours.  Invalid input(s): FREET3 Anemia work up No results for input(s): VITAMINB12,  FOLATE, FERRITIN, TIBC, IRON, RETICCTPCT in the last 72 hours. Urinalysis    Component Value Date/Time   COLORURINE YELLOW 01/10/2012 1844   APPEARANCEUR CLEAR 01/10/2012 1844   LABSPEC 1.010 01/10/2012 1844   PHURINE 7.0 01/10/2012 1844   GLUCOSEU NEGATIVE 01/10/2012 1844   HGBUR NEGATIVE 01/10/2012 1844   BILIRUBINUR NEGATIVE 01/10/2012 1844   KETONESUR NEGATIVE 01/10/2012 1844   PROTEINUR NEGATIVE 01/10/2012 1844   UROBILINOGEN 0.2 01/10/2012 1844   NITRITE NEGATIVE 01/10/2012 1844   LEUKOCYTESUR NEGATIVE 01/10/2012 1844   Sepsis Labs Invalid input(s): PROCALCITONIN,  WBC,  LACTICIDVEN Microbiology Recent Results (from the past 240 hour(s))  Blood Culture (routine x 2)     Status: None   Collection Time: 04/23/18 10:35 PM  Result Value Ref Range Status   Specimen Description   Final    BLOOD LEFT ARM Performed at [facility], [address] [place], [place] [address]    Special Requests   Final    BOTTLES DRAWN AEROBIC AND ANAEROBIC Blood Culture adequate volume Performed at [facility], [place] [address] [place], [place] [address]    Culture   Final    NO GROWTH 5 DAYS Performed at [facility], [place] [address], [place], [place] [address]    Report Status 04/29/2018 FINAL  Final  Blood Culture (routine x 2)     Status: None   Collection Time: 04/23/18 10:45 PM  Result Value Ref Range Status   Specimen Description   Final    BLOOD RIGHT FOREARM Performed at [facility], [address] [place], [place] [address]    Special Requests   Final    BOTTLES DRAWN AEROBIC AND ANAEROBIC Blood Culture results may not be optimal due to an inadequate volume of blood received in culture bottles Performed at [facility], [place] [place], [place] [address]    Culture   Final    NO GROWTH 5 DAYS Performed at [facility], [place] [address] [place], [place] [address]    Report Status 04/29/2018 FINAL  Final   Culture, body fluid-bottle     Status: None   Collection Time: 04/24/18  8:45 AM  Result Value Ref Range Status   Specimen Description PLEURAL FLUID  Final   Special Requests BOTTLES DRAWN AEROBIC AND ANAEROBIC  Final   Culture   Final    NO GROWTH 5 DAYS Performed at [facility], [place] [address], [place], [place] [address]    Report Status 04/29/2018 FINAL  Final  Gram stain     Status: None   Collection Time: 04/24/18  8:45 AM  Result Value Ref Range Status   Specimen Description PLEURAL FLUID  Final   Special Requests NONE  Final   Gram Stain   Final    WBC PRESENT,BOTH PMN AND MONONUCLEAR NO ORGANISMS SEEN CYTOSPIN SMEAR Performed at [facility], [address] [place],  [address]    Report Status 04/24/2018 FINAL  Final  MRSA PCR Screening     Status: None   Collection Time: 04/24/18 11:06 AM  Result Value Ref Range Status   MRSA by PCR NEGATIVE NEGATIVE Final    Comment:  The GeneXpert MRSA Assay (FDA approved for NASAL specimens only), is one component of a comprehensive MRSA colonization surveillance program. It is not intended to diagnose MRSA infection nor to guide or monitor treatment for MRSA infections. Performed at [facility], [place] [address], [place], [place] [address]   Culture, body fluid-bottle     Status: None   Collection Time: 04/25/18  1:53 PM  Result Value Ref Range Status   Specimen Description PERITONEAL  Final   Special Requests NONE  Final   Culture   Final    NO GROWTH 5 DAYS Performed at [facility], [address] [place], [place] [address]    Report Status 04/30/2018 FINAL  Final  Gram stain     Status: None   Collection Time: 04/25/18  1:53 PM  Result Value Ref Range Status   Specimen Description PERITONEAL  Final   Special Requests NONE  Final   Gram Stain   Final    WBC PRESENT, PREDOMINANTLY MONONUCLEAR NO ORGANISMS SEEN Performed at [facility], [place] [address], [place], [place] [address]    Report Status 04/25/2018 FINAL  Final     Patient was seen and examined on the day of discharge and was found to be in stable condition. Time coordinating discharge: 25 minutes including assessment and coordination of care, as well as examination of the patient.   SIGNED:  Dessa Phi, DO [facility] Pager [phone]  If 7PM-7AM, please contact night-coverage [email or web address] Password [facility] 05/03/2018, 12:07 PM

## 2018-05-03 NOTE — Progress Notes (Signed)
Progress Note  Patient Name: Dawn Moon Date of Encounter: 05/03/2018  Primary Cardiologist: Dr. Kirk Ruths  Subjective   Pt denies CP or dyspnea   Inpatient Medications    Scheduled Meds: . atenolol  25 mg Oral q morning - 10a  . Eluxadoline  75 mg Oral BID  . enoxaparin (LOVENOX) injection  40 mg Subcutaneous Q24H  . furosemide  80 mg Oral Daily  . pantoprazole  40 mg Oral q morning - 10a  . spironolactone  200 mg Oral Daily   Continuous Infusions:  PRN Meds: ondansetron **OR** ondansetron (ZOFRAN) IV, oxyCODONE, promethazine, traMADol   Vital Signs    Vitals:   05/02/18 0453 05/02/18 1352 05/02/18 2005 05/03/18 0453  BP: (!) 116/59 120/71 121/70 118/64  Pulse: 80 84 84 87  Resp: 16 18 20 15   Temp: (!) 97.5 F (36.4 C) 98.1 F (36.7 C) 98.4 F (36.9 C) 98.3 F (36.8 C)  TempSrc: Oral Oral    SpO2: 91% 92% 95% 94%  Weight:    194 lb 0.1 oz (88 kg)  Height:        Intake/Output Summary (Last 24 hours) at 05/03/2018 0919 Last data filed at 05/03/2018 5329 Gross per 24 hour  Intake 240 ml  Output 1500 ml  Net -1260 ml   Filed Weights   05/01/18 0511 05/02/18 0453 05/03/18 0453  Weight: 196 lb 6.9 oz (89.1 kg) 191 lb 12.8 oz (87 kg) 194 lb 0.1 oz (88 kg)   Physical Exam   General: WD/WN NAD  Head: Normal Neck: Supple Lungs: Decreased BS RLL Cardiovascular: RRR Abdomen: NT/ND Extremities: s/p RTKA; trace edema Neuro: Grossly intact Psych: Normal affect  Labs    Chemistry Recent Labs  Lab 05/01/18 0543 05/02/18 0604 05/03/18 0539  NA 137 135 134*  K 4.1 3.7 3.7  CL 96* 98* 97*  CO2 27 26 25   GLUCOSE 98 98 101*  BUN 10 10 10   CREATININE 0.80 0.77 0.86  CALCIUM 9.1 8.7* 8.7*  GFRNONAA >60 >60 >60  GFRAA >60 >60 >60  ANIONGAP 14 11 12      Hematology Recent Labs  Lab 04/30/18 0536 05/01/18 0543 05/02/18 0604  WBC 5.2 6.2 6.2  RBC 4.13 4.46 4.13  HGB 11.7* 13.3 12.1  HCT 37.6 40.9 37.5  MCV 91.0 91.7 90.8  MCH  28.3 29.8 29.3  MCHC 31.1 32.5 32.3  RDW 15.7* 15.5 15.3  PLT 154 160 152    Radiology    Dg Chest 2 View  Result Date: 05/02/2018 CLINICAL DATA:  Cough, shortness of breath, and right flank pain beginning this morning. EXAM: CHEST - 2 VIEW COMPARISON:  Chest x-ray of Apr 30, 2018 FINDINGS: There is no pneumothorax. There is a moderate-sized right pleural effusion. There is no mediastinal shift. The left lung is clear. The heart and pulmonary vascularity are normal. IMPRESSION: Moderate-sized right pleural effusion slightly more conspicuous than from the study of 2 days ago. No pneumothorax. Electronically Signed   By: David  Martinique M.D.   On: 05/02/2018 08:17   Telemetry    Sinus with nonconducted PAC- Personally Reviewed  Cardiac Studies   Echocardiogram 04/25/2018: Study Conclusions  - Left ventricle: The cavity size was normal. Wall thickness was normal. Systolic function was normal. The estimated ejection fraction was in the range of 60% to 65%. Wall motion was normal; there were no regional wall motion abnormalities. There was no evidence of elevated ventricular filling pressure by Doppler parameters. -  Left atrium: The atrium was mildly dilated.  Patient Profile     62 y.o. female with a history of hypertension, NSVT, NASH cirrhosis, IBS, HTN and recent right knee replacement who presented  with large right pleural effusion and ascites. She is status post thoracentesis and continues on IV diuresis.  Assessment & Plan    1. Hypoxic respiratory failure in the setting of large pleural effusion: -Secondary to right pleural effusion; s/p thoracentesis with significant improvement in symptoms. Continue lasix and spironolactone at present dose. Renal function stable. I/O-1960 -Echo shows LVEF 60-65% without evidence of diastolic dysfunction   2. NASH cirrhosis with ascites: -s/p paracentesis; management per GI. Fu Dr Carlean Purl following DC.  3. S/p right knee  replacement:  4. HTN: -Atenolol decreased to 25 mg daily as BP soft; continue at present dose.  5. NSVT: -continue atenolol at present dose.  Pt can be DCed from a cardiac standpoint; fu GI and in our office. Please call with questions; cardiology will sign off.  Signed, Kirk Ruths MD [facility] 05/03/2018, 9:19 AM   For questions or updates, please contact   Please consult [email or web address] for contact info under Cardiology/STEMI.

## 2018-05-03 NOTE — Telephone Encounter (Signed)
Spoke to Pt's daughter Museum/gallery conservator. She will get a copy delivered to the specialist, may or may not need to pick up copy from office. Placed in front filing cabinet.

## 2018-05-04 ENCOUNTER — Telehealth: Payer: Self-pay | Admitting: Emergency Medicine

## 2018-05-04 NOTE — Telephone Encounter (Signed)
LM for patient to call back to complete TCM and schedule Hospital Followup.   Doloris Hall,  LPN

## 2018-05-04 NOTE — Telephone Encounter (Signed)
Transition Care Management Follow-up Telephone Call   Date discharged? 05/03/2018   How have you been since you were released from the hospital? "I am weak, but I am better than before I went in"    Do you understand why you were in the hospital? yes, Pleural Effusion related to Non-Alcoholic Cirrhosis    Do you understand the discharge instructions? yes   Where were you discharged to? Home   Items Reviewed:  Medications reviewed: yes  Allergies reviewed: yes  Dietary changes reviewed: yes, low sodium & limited fluid restriction   Referrals reviewed: yes   Functional Questionnaire:   Activities of Daily Living (ADLs):   She states they are independent in the following: ambulation, bathing and hygiene, feeding, continence, grooming, toileting and dressing States they require assistance with the following: none    Any transportation issues/concerns?: no   Any patient concerns? no   Confirmed importance and date/time of follow-up visits scheduled yes  Provider Appointment booked with Dr. Jonni Sanger on 05/06/18 at 11:00am   Confirmed with patient if condition begins to worsen call PCP or go to the ER.  Patient was given the office number and encouraged to call back with question or concerns.  : yes

## 2018-05-06 ENCOUNTER — Encounter: Payer: Self-pay | Admitting: Family Medicine

## 2018-05-06 ENCOUNTER — Ambulatory Visit (INDEPENDENT_AMBULATORY_CARE_PROVIDER_SITE_OTHER): Payer: 59 | Admitting: Family Medicine

## 2018-05-06 ENCOUNTER — Ambulatory Visit (INDEPENDENT_AMBULATORY_CARE_PROVIDER_SITE_OTHER): Payer: 59

## 2018-05-06 ENCOUNTER — Telehealth: Payer: Self-pay | Admitting: *Deleted

## 2018-05-06 ENCOUNTER — Encounter: Payer: Self-pay | Admitting: *Deleted

## 2018-05-06 ENCOUNTER — Other Ambulatory Visit: Payer: Self-pay

## 2018-05-06 VITALS — BP 110/74 | HR 89 | Temp 97.8°F | Ht 64.0 in | Wt 186.0 lb

## 2018-05-06 DIAGNOSIS — J90 Pleural effusion, not elsewhere classified: Secondary | ICD-10-CM

## 2018-05-06 DIAGNOSIS — K7581 Nonalcoholic steatohepatitis (NASH): Secondary | ICD-10-CM

## 2018-05-06 DIAGNOSIS — K7460 Unspecified cirrhosis of liver: Secondary | ICD-10-CM

## 2018-05-06 LAB — BASIC METABOLIC PANEL
BUN: 19 mg/dL (ref 6–23)
CHLORIDE: 96 meq/L (ref 96–112)
CO2: 30 mEq/L (ref 19–32)
Calcium: 10.1 mg/dL (ref 8.4–10.5)
Creatinine, Ser: 1.31 mg/dL — ABNORMAL HIGH (ref 0.40–1.20)
GFR: 43.77 mL/min — ABNORMAL LOW (ref >60.00)
Glucose, Bld: 124 mg/dL — ABNORMAL HIGH (ref 70–99)
POTASSIUM: 5 meq/L (ref 3.5–5.1)
Sodium: 136 mEq/L (ref 135–145)

## 2018-05-06 MED ORDER — ONDANSETRON HCL 4 MG PO TABS: 4.0000 mg | ORAL_TABLET | Freq: Three times a day (TID) | ORAL | 0 refills | Status: DC | PRN

## 2018-05-06 NOTE — Patient Instructions (Signed)
Follow-up in as Needed/Scheduled  Please go to the Lab for blood work.    If you have MyChart, your results will be available to view, please respond through [place] with questions.  We will schedule follow-up according to results.  Please go to the [place] office for your chest x-ray.  Orders have been placed so you just have to go in and they will take care of you.  We will let you know results.   [place] [address] [place], [place] [address]

## 2018-05-06 NOTE — Telephone Encounter (Signed)
Copied from [place] [id number]. Topic: General - Other >> May 06, 2018 11:57 AM Oneta Rack wrote: Caller name: SEILER,Amber Relation to pt: daughter  Call back number: [phone] Pharmacy: [place], [place] [phone] (Phone) [phone] (Fax)  Reason for call:  Patient was seen today and forgot to ask PCP if she can prescribe phenergan due to her being  nausea, please advise >> May 06, 2018 12:00 PM Oneta Rack wrote: Caller name: SEILER,Amber Relation to pt: daughter  Call back number: [phone] Pharmacy: [place], [place] [phone] (Phone) [phone] (Fax)  Reason for call:  Patient was seen today and forgot to ask PCP if she can prescribe phenergan due to her being  nausea, please advise

## 2018-05-06 NOTE — Telephone Encounter (Signed)
Ordered through her mychart request. Zofran. See there.

## 2018-05-06 NOTE — Progress Notes (Signed)
Subjective  CC:  Chief Complaint  Patient presents with  . Hospitalization Follow-up    discharged on 05/03/2018, had pleural effusion     HPI: Dawn Moon is a 62 y.o. female who presents to the office today to address the problems listed above in the chief complaint. As above, recently d/c'd from [facility]: I've personally reviewed recent office visit notes, hospital notes, associated labs and imaging reports and/or pertinent outside office records via chart review or CareEverywhere. Briefly, ARF due to large right pleural effusion and ascites due to newly dxd cirrhosis due to NASH. Effectively diureses and s/o thoracentesis and abdominal paracentesis.    reports that she is doing fine. Stable breathing and weight on meds. Following 1.5 ltr fluid restriction and 2gm low sodium diet. Fatigued but no new sob, cp or fevers. Was written back to work on Monday but feels too weak to do that. Due for cxr and repeat bmp today.  Has f/u with Cards and GI next week.  I reviewed the patients updated PMH, FH, and SocHx.    Patient Active Problem List   Diagnosis Date Noted  . SOB (shortness of breath)   . Acute hypoxemic respiratory failure ([place]) 04/24/2018  . Pleural effusion on right 04/24/2018  . Leg edema, right 04/24/2018  . Ascites   . Hypoxia   . Liver cirrhosis secondary to NASH (nonalcoholic steatohepatitis) ([place]) 04/23/2018  . Osteoarthritis of right knee 03/24/2018  . IBS (irritable bowel syndrome) 06/15/2017  . GERD (gastroesophageal reflux disease) 06/15/2017  . HLD (hyperlipidemia)   . Fatty liver   . NSVT (nonsustained ventricular tachycardia) ([place])   . Nephrolithiasis   . Essential hypertension 02/13/2010   Current Meds  Medication Sig  . aspirin 81 MG chewable tablet Chew 1 tablet (81 mg total) by mouth 2 (two) times daily.  Marland Kitchen atenolol (TENORMIN) 25 MG tablet Take 1 tablet (25 mg total) by mouth every morning.  . Butalbital-APAP-Caffeine 50-300-40 MG CAPS Take 1 tablet  by mouth every 8 (eight) hours as needed. (Patient taking differently: Take 1 tablet by mouth every 8 (eight) hours as needed (for pain.). )  . Eluxadoline (VIBERZI) 75 MG TABS Take 75 mg by mouth 2 (two) times daily. (Patient taking differently: Take 75 mg by mouth 2 (two) times daily. )  . furosemide (LASIX) 80 MG tablet Take 1 tablet (80 mg total) by mouth daily.  . pantoprazole (PROTONIX) 40 MG tablet Take 1 tablet (40 mg total) by mouth every morning.  Marland Kitchen spironolactone (ALDACTONE) 100 MG tablet Take 2 tablets (200 mg total) by mouth daily.    Allergies: Patient has No Known Allergies. Family History: Patient family history includes Arthritis in her sister; Celiac disease in her daughter; Coronary artery disease in her father and mother; Diabetes in her father; Healthy in her brother; Hypertension in her sister; IgA nephropathy in her daughter; Lymphoma in her mother. Social History:  Patient  reports that she has never smoked. She has never used smokeless tobacco. She reports that she drinks alcohol. She reports that she does not use drugs.  Review of Systems: Constitutional: Negative for fever malaise or anorexia Cardiovascular: negative for chest pain Respiratory: negative for SOB or persistent cough Gastrointestinal: negative for abdominal pain  Objective  Vitals: BP 110/74   Pulse 89   Temp 97.8 F (36.6 C)   Ht 5' 4"  (1.626 m)   Wt 186 lb (84.4 kg)   BMI 31.93 kg/m  General: no acute distress , A&Ox3,  no respiratory distress HEENT: PEERL, conjunctiva normal, Oropharynx moist,neck is supple, no jvd Cardiovascular:  RRR without murmur or gallop.  Respiratory:  Decreased BS at right base with dullness to percussion; no egophony. Good breath sounds bilaterally otherwise, no rales, rhonchi or wheezing No le edema ABD: no ascites appreciated on exam. No hsm or RUQ ttp, soft, nl bs Skin:  Warm, no rashes  Assessment  1. Liver cirrhosis secondary to NASH (nonalcoholic  steatohepatitis) ([place])   2. Pleural effusion on right      Plan   Cirrhosis w/o ascites and pl effusion:  Repeat CXR. Counseling done. Weight log from home confirms stable weight. Continue same meds. F/u with GI for further care regarding cirrhosis. ? tranplant or TIPs candidate.  Delay return to work due to deconditioning. Note written. GI to complete disability /fmla paperwork.   Follow up: Return for as scheduled for cpe.    Commons side effects, risks, benefits, and alternatives for medications and treatment plan prescribed today were discussed, and the patient expressed understanding of the given instructions. Patient is instructed to call or message via MyChart if he/she has any questions or concerns regarding our treatment plan. No barriers to understanding were identified. We discussed Red Flag symptoms and signs in detail. Patient expressed understanding regarding what to do in case of urgent or emergency type symptoms.   Medication list was reconciled, printed and provided to the patient in AVS. Patient instructions and summary information was reviewed with the patient as documented in the AVS. This note was prepared with assistance of Dragon voice recognition software. Occasional wrong-word or sound-a-like substitutions may have occurred due to the inherent limitations of voice recognition software  Orders Placed This Encounter  Procedures  . DG Chest 2 View  . Basic metabolic panel   No orders of the defined types were placed in this encounter.

## 2018-05-09 ENCOUNTER — Telehealth: Payer: Self-pay | Admitting: Family Medicine

## 2018-05-09 NOTE — Telephone Encounter (Signed)
Patient notified of her lab results- she has been recording her weight- she states she had been losing 2 lbs/ day- but seems to have leveled out- to 181.5 yesterday and today. Patient reports she has cramping in her hands, feet and sides- so bad she had to take 1/2 pain pill last night.  She states today the pain is not quite as bad. She has an appointment at GI on 5/29 and she states she can get her labs done there.  See lab results- not in basket

## 2018-05-09 NOTE — Telephone Encounter (Signed)
Copied from [place] [id number]. Topic: Quick Communication - Lab Results >> May 06, 2018  3:39 PM Sigurd Sos, LPN wrote: When patient calls, call amy @ [phone]  >> May 06, 2018  5:20 PM Waylan Rocher, [place] L wrote: Patient returned call. Please call her back.

## 2018-05-09 NOTE — Telephone Encounter (Signed)
Left detailed message on patient's VM.  CRM created so that PEC can disclose information as needed.

## 2018-05-09 NOTE — Telephone Encounter (Signed)
I'm hoping the leg cramps will improve once she is less dehydrated; cutting back on the diuretic as instructed should help with this. I recommend stretching the calves 3x/day to help.

## 2018-05-10 ENCOUNTER — Telehealth: Payer: Self-pay | Admitting: Family Medicine

## 2018-05-10 ENCOUNTER — Ambulatory Visit (INDEPENDENT_AMBULATORY_CARE_PROVIDER_SITE_OTHER): Payer: 59 | Admitting: Physician Assistant

## 2018-05-10 ENCOUNTER — Encounter: Payer: Self-pay | Admitting: Physician Assistant

## 2018-05-10 VITALS — BP 115/78 | HR 108 | Ht 64.0 in | Wt 181.0 lb

## 2018-05-10 DIAGNOSIS — Z8679 Personal history of other diseases of the circulatory system: Secondary | ICD-10-CM

## 2018-05-10 DIAGNOSIS — J90 Pleural effusion, not elsewhere classified: Secondary | ICD-10-CM

## 2018-05-10 DIAGNOSIS — K7581 Nonalcoholic steatohepatitis (NASH): Secondary | ICD-10-CM

## 2018-05-10 DIAGNOSIS — G4733 Obstructive sleep apnea (adult) (pediatric): Secondary | ICD-10-CM

## 2018-05-10 DIAGNOSIS — I10 Essential (primary) hypertension: Secondary | ICD-10-CM | POA: Diagnosis not present

## 2018-05-10 MED ORDER — PROMETHAZINE HCL 12.5 MG RE SUPP: 12.5000 mg | Freq: Four times a day (QID) | RECTAL | 0 refills | Status: DC | PRN

## 2018-05-10 NOTE — Progress Notes (Signed)
Cardiology Office Note    Date:  05/10/2018   ID:  Dawn Moon, DOB August 01, 1956, MRN [id number]  PCP:  Leamon Arnt, MD  Cardiologist: Dr. Stanford Breed  Electrophysiologist: Dr. Caryl Comes  Chief Complaint  Patient presents with  . Hospitalization Follow-up    seen for Dr. Stanford Breed    History of Present Illness:  Dawn Moon is a 62 y.o. female with PMH of HTN, IBS, NASH, OSA, kidney stone, history of paroxysmal supraventricular tachycardia.  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 She was first evaluated in 2011 for palpitation, cardiac monitor at the time showed 4 beats of nonsustained VT and also brief atrial tachycardia.  TSH and potassium were normal.  Cardiac catheterization in 2013 showed no obstructive coronary artery disease.  EF was normal.  Echocardiogram in January 2013 showed normal LV function, mild LVH, trace tricuspid regurgitation.  In 2015, she had a coronary CT for chest pain, this showed nonobstructive calcified plaque in the proximal LAD in the 25% range.  Medical therapy was recommended.    She was seen in January 2019 for preop clearance prior to right total knee replacement.  She eventually underwent procedure on 03/24/2018.  Due to worsening swelling after the surgery, her Lasix went up to 40 mg daily.  She was admitted in early May 2019 with acute shortness of breath.  She also complained of some pleuritic chest pain and was brought to [facility].  CTA of the chest was negative for PE.  She was found to have large right pleural effusion and ascites.  She was treated with BiPAP therapy and underwent R thoracentesis with removal of 1.7 L of transudate of pleural fluid on 5/5.  This was later followed by repeat thoracentesis on 5/9.  She also underwent paracentesis on 5/6 with 3 L fluid removed followed by IV albumin x3 doses.  Repeat echocardiogram obtained on 04/25/2018 showed EF 60 to 65%, no evidence of diastolic  dysfunction, without motion abnormality or valvular abnormality.  The pleural effusion was likely related to NASH cirrhosis.  Her atenolol was decreased to 25 mg daily due to soft blood pressure.  She was eventually discharged on 80 mg daily of Lasix and 200 mg daily of spironolactone.  Her discharge weight was 194 pounds.  Since her discharge, lab work obtained on 5/17 showed worsening renal function with high potassium and a creatinine of 1.4.  Her Lasix was kept at 80 mg daily while spironolactone was reduced to 100 mg daily.  Repeat chest x-ray obtained on the same day showed improvement of moderate right pleural effusion to mild.  Patient presents today for cardiology office visit.  She has not been taking the atenolol for the past few days due to significant drop in the blood pressure down to the 90/60 range after she started on atenolol at home. Her BP has improved since then.  Her weight is also dropping as well.  Since her discharge, she has dropped close to 10 pounds.  Her current weight at home is 179 pounds based on home scale.  She has occasional cramps when she moves around.  This seems to be more associated with body position changes.  On physical exam, she has no significant lower extremity edema.  She denies any orthopnea or PND.  I will continue her on 80 mg daily of Lasix and 100 mg daily of spironolactone for now.  She is due to have repeat blood  work this Friday by her PCP, I will follow-up on the lab results as well.  If potassium is stable however her renal function continued to deteriorate, I think would be reasonable to cut back her Lasix to 60 mg daily instead.  Once her weight and volume status stabilized, I think would be worthwhile to retry atenolol or even consider bisoprolol or metoprolol.  Note her heart rate is borderline elevated today, however heart rate is extremely regular on physical exam   Past Medical History:  Diagnosis Date  . GERD (gastroesophageal reflux disease)     . History of exercise stress test    03-07-2010  Stress echo--- no arrhythmias or conduction abnormalilites and negative for ischemia or chest pain  . History of kidney stones   . History of paroxysmal supraventricular tachycardia    episode 2011  consult w/ dr Caryl Comes --  put on atenolol--  per pt no longer an issue  . HTN (hypertension)    cardiologist --  dr Stanford Breed  . IBS (irritable bowel syndrome)    diarrhea  . Nonalcoholic steatohepatitis (NASH)   . Numbness and tingling of left lower extremity    post achilles tendon repair  . OA (osteoarthritis)    knees  . PONV (postoperative nausea and vomiting)   . Vitamin D deficiency   . Wears glasses     Past Surgical History:  Procedure Laterality Date  . ACHILLES TENDON SURGERY Left 06/2016  . BREAST BIOPSY Left 02/2016   benign  . COLONOSCOPY  2005  . CT CTA CORONARY W/CA SCORE W/CM &/OR WO/CM  01/01/2014   non-obstructive calcified plaque in pLAD (0-25%), no significant incidental noncardiac findings noted  . ESOPHAGOGASTRODUODENOSCOPY  01/2014  . EXTRACORPOREAL SHOCK WAVE LITHOTRIPSY  2010  . KNEE ARTHROPLASTY Right 03/24/2018   Procedure: RIGHT TOTAL KNEE ARTHROPLASTY WITH COMPUTER NAVIGATION;  Surgeon: Rod Can, MD;  Location: [facility];  Service: Orthopedics;  Laterality: Right;  Needs RNFA  . KNEE ARTHROSCOPY Right 12/04/2016   Procedure: ARTHROSCOPY KNEE WITH PARTIAL MEDIAL MENISCECTOMY;  Surgeon: Rod Can, MD;  Location: [facility];  Service: Orthopedics;  Laterality: Right;  . LEFT HEART CATHETERIZATION WITH CORONARY ANGIOGRAM N/A 01/11/2012   Procedure: LEFT HEART CATHETERIZATION WITH CORONARY ANGIOGRAM;  Surgeon: Sherren Mocha, MD;  Location: [facility] CATH LAB;  Service: Cardiovascular;  Laterality: N/A;  widely patent coronary arterires without significant obstructive CAD,  normal LVF, ef 55-56%  . TONSILLECTOMY AND ADENOIDECTOMY  child  . TRANSTHORACIC ECHOCARDIOGRAM  01/18/2012   mild LVH,  ef  60%/  trivial TR  . TUBAL LIGATION  1985    Current Medications: Outpatient Medications Prior to Visit  Medication Sig Dispense Refill  . aspirin 81 MG chewable tablet Chew 1 tablet (81 mg total) by mouth 2 (two) times daily. 60 tablet 1  . atenolol (TENORMIN) 25 MG tablet Take 1 tablet (25 mg total) by mouth every morning. 30 tablet 0  . Butalbital-APAP-Caffeine 50-300-40 MG CAPS Take 1 tablet by mouth every 8 (eight) hours as needed. (Patient taking differently: Take 1 tablet by mouth every 8 (eight) hours as needed (for pain.). ) 84 capsule 2  . Eluxadoline (VIBERZI) 75 MG TABS Take 75 mg by mouth 2 (two) times daily. (Patient taking differently: Take 75 mg by mouth 2 (two) times daily. ) 60 tablet 5  . furosemide (LASIX) 80 MG tablet Take 1 tablet (80 mg total) by mouth daily. 30 tablet 0  . HYDROcodone-acetaminophen (NORCO/VICODIN) 5-325 MG tablet Take  1 tablet by mouth every 6 (six) hours as needed for moderate pain.    Marland Kitchen ondansetron (ZOFRAN) 4 MG tablet Take 1 tablet (4 mg total) by mouth every 8 (eight) hours as needed for nausea or vomiting. 20 tablet 0  . pantoprazole (PROTONIX) 40 MG tablet Take 1 tablet (40 mg total) by mouth every morning. 30 tablet 0  . spironolactone (ALDACTONE) 100 MG tablet Take 100 mg by mouth daily.    Marland Kitchen spironolactone (ALDACTONE) 100 MG tablet Take 2 tablets (200 mg total) by mouth daily. 30 tablet 0   No facility-administered medications prior to visit.      Allergies:   Patient has no known allergies.   Social History   Socioeconomic History  . Marital status: Married    Spouse name: Not on file  . Number of children: Not on file  . Years of education: Not on file  . Highest education level: Not on file  Occupational History  . 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 Financial resource strain: Not on file  . Food insecurity:    Worry: Not on file    Inability: Not on file  . Transportation needs:      Medical: Not on file    Non-medical: Not on file  Tobacco Use  . Smoking status: Never Smoker  . Smokeless tobacco: Never Used  Substance and Sexual Activity  . Alcohol use: Yes    Comment: Rare  . Drug use: No  . Sexual activity: Yes  Lifestyle  . Physical activity:    Days per week: Not on file    Minutes per session: Not on file  . Stress: Not on file  Relationships  . Social connections:    Talks on phone: Not on file    Gets together: Not on file    Attends religious service: Not on file    Active member of club or organization: Not on file    Attends meetings of clubs or organizations: Not on file    Relationship status: Not on file  Other Topics Concern  . 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  Has to walk up stairs to work and can do w/o Ss.  Lives @ home in [place] with family.     Family History:  The patient's family history includes Arthritis in her sister; Celiac disease in her daughter; Coronary artery disease in her father and mother; Diabetes in her father; Healthy in her brother; Hypertension in her sister; IgA nephropathy in her daughter; Lymphoma in her mother.   ROS:   Please see the history of present illness.    ROS All other systems reviewed and are negative.   PHYSICAL EXAM:   VS:  BP 115/78   Pulse (!) 108   Ht 5' 4"  (1.626 m)   Wt 181 lb (82.1 kg)   BMI 31.07 kg/m    GEN: Well nourished, well developed, in no acute distress  HEENT: normal  Neck: no JVD, carotid bruits, or masses Cardiac: RRR; no murmurs, rubs, or gallops,no edema  Respiratory:  clear to auscultation bilaterally.  Minimal decreased breath sounds in the right base of the lung GI: soft, nontender, nondistended, + BS MS: no deformity or  atrophy  Skin: warm and dry, no rash Neuro:  Alert and Oriented x  3, Strength and sensation are intact Psych: euthymic mood, full affect  Wt Readings from Last 3 Encounters:  05/10/18 181 lb (82.1 kg)  05/06/18 186 lb (84.4 kg)  05/03/18 194 lb 0.1 oz (88 kg)      Studies/Labs Reviewed:   EKG:  EKG is not ordered today.  She appears to be in mild sinus tachycardia based on physical exam  Recent Labs: 04/23/2018: ALT 15 04/27/2018: Magnesium 1.7 05/02/2018: Hemoglobin 12.1; Platelets 152 05/06/2018: BUN 19; Creatinine, Ser 1.31; Potassium 5.0; Sodium 136   Lipid Panel    Component Value Date/Time   CHOL 200 (H) 06/15/2017 0955   TRIG 192 (H) 06/15/2017 0955   HDL 50 06/15/2017 0955   CHOLHDL 4.0 06/15/2017 0955   CHOLHDL 3.0 01/11/2012 0051   VLDL 41 (H) 01/11/2012 0051   LDLCALC 112 (H) 06/15/2017 0955    Additional studies/ records that were reviewed today include:   Echo 04/25/2018 LV EF: 60% -   65% Study Conclusions  - Left ventricle: The cavity size was normal. Wall thickness was   normal. Systolic function was normal. The estimated ejection   fraction was in the range of 60% to 65%. Wall motion was normal;   there were no regional wall motion abnormalities. There was no   evidence of elevated ventricular filling pressure by Doppler   parameters. - Left atrium: The atrium was mildly dilated.    ASSESSMENT:    1. Recurrent right pleural effusion   2. Essential hypertension   3. OSA (obstructive sleep apnea)   4. NASH (nonalcoholic steatohepatitis)   5. History of paroxysmal supraventricular tachycardia      PLAN:  In order of problems listed above:  1. Recurrent right pleural effusion: Related volume overload after recent surgery.  Underwent 2 repeat thoracentesis in early May, last chest x-ray showed stable residual right pleural effusion.  Cytology suggestive macrophages and reactive mesothelial cells.  2. Hypoxic respiratory failure: Resolved. Appears to be euvolemic, I am more worried about dehydration at this point  instead of volume overload.  She has lost roughly 10 pounds since discharge.  Recent basic metabolic panel shows worsening renal function and the jump in the potassium level, her spironolactone was decreased to 100 mg daily.  She is due for repeat basic metabolic panel this Friday, if renal function continued to worsen, I would have very low threshold to reduce her Lasix to 60 mg daily instead.  Note, she developed volume overload on 40 mg daily of Lasix.  3. Ascites: Seems to have resolved after recent paracentesis with removal of 3 L of fluid, abdomen soft and nondistended today.  Peritoneal fluid showed white blood cell, predominantly mononuclear, otherwise no organisms seen.  4. NASH: Recent CT of abdomen obtained on 04/23/2018 suggested cirrhotic changes.  Will need to be followed by Dr. Celesta Aver office   5. Hypertension: She was discharged on 25 mg daily of atenolol, however blood pressure was dropping into the 90s, she has since stopped atenolol.  She can take atenolol on a as needed basis.  Once her volume status normalized, hopefully her blood pressure will be stable enough to either restart atenolol or try metoprolol or bisoprolol.  6. NSVT: No significant palpitation.  Use atenolol on an as-needed basis for now.   Medication Adjustments/Labs and Tests Ordered: Current medicines are reviewed at length with the patient today.  Concerns regarding medicines are outlined  above.  Medication changes, Labs and Tests ordered today are listed in the Patient Instructions below. Patient Instructions  Medication Instructions:  HOLD Atenolol; Take ONLY as needed for palpitations  Labwork: None   Testing/Procedures: None   Follow-Up: Follow up with Dr Stanford Breed per Isaac Laud  Any Other Special Instructions Will Be Listed Below (If Applicable).  If you need a refill on your cardiac medications before your next appointment, please call your pharmacy.     Dawn Moon, [place]  05/10/2018 5:24 PM      [facility] [address], [place], [place]  [address] Phone: [phone]; Fax: [phone]

## 2018-05-10 NOTE — Telephone Encounter (Signed)
Sister of Dawn Moon [facility]) called to see if she can also get a phenergan tab ordered to take so that when she is feeling better and not have to use the suppository. Dawn Moon was in the room with her at the time.

## 2018-05-10 NOTE — Telephone Encounter (Signed)
Instructed verbally by Dr. Jonni Sanger to call in Phenergan 12.5 suppository for patient. If this does not work, patient needs to contact GI doctor.  I called patient to let her know. Left detailed message for her.  If patient calls back, okay for [facility] to disclose information.

## 2018-05-10 NOTE — Telephone Encounter (Signed)
Spoke with patient.  Per Dr. Jonni Sanger - Patient needs to try the suppository. If this does not work then she needs to get in touch with the GI doctor.   Patient stated verbal understanding.

## 2018-05-10 NOTE — Addendum Note (Signed)
Addended by: Katina Dung on: 05/10/2018 02:06 PM   Modules accepted: Orders

## 2018-05-10 NOTE — Patient Instructions (Signed)
Medication Instructions:  HOLD Atenolol; Take ONLY as needed for palpitations  Labwork: None   Testing/Procedures: None   Follow-Up: Follow up with Dr Stanford Breed per Isaac Laud  Any Other Special Instructions Will Be Listed Below (If Applicable).  If you need a refill on your cardiac medications before your next appointment, please call your pharmacy.

## 2018-05-10 NOTE — Telephone Encounter (Signed)
Pt is still vomiting. Pt is not able to keep down the zofran sent in or phenergan (from before). Pt prefers the phenergan 12.55m as it works better. Sister asking if there an option other than oral med? She is with the pt. Please call to notify pt.  [place] [phone](Phone) [phone](Fax)

## 2018-05-12 ENCOUNTER — Encounter: Payer: Self-pay | Admitting: Family Medicine

## 2018-05-13 ENCOUNTER — Inpatient Hospital Stay (HOSPITAL_BASED_OUTPATIENT_CLINIC_OR_DEPARTMENT_OTHER)
Admission: EM | Admit: 2018-05-13 | Discharge: 2018-05-19 | DRG: 442 | Disposition: A | Discharge status: Home Health | Payer: 59 | Attending: Internal Medicine | Admitting: Internal Medicine

## 2018-05-13 ENCOUNTER — Other Ambulatory Visit: Payer: Self-pay | Admitting: *Deleted

## 2018-05-13 ENCOUNTER — Emergency Department (HOSPITAL_BASED_OUTPATIENT_CLINIC_OR_DEPARTMENT_OTHER): Payer: 59

## 2018-05-13 ENCOUNTER — Encounter (HOSPITAL_BASED_OUTPATIENT_CLINIC_OR_DEPARTMENT_OTHER): Payer: Self-pay

## 2018-05-13 ENCOUNTER — Other Ambulatory Visit: Payer: Self-pay

## 2018-05-13 DIAGNOSIS — Z8249 Family history of ischemic heart disease and other diseases of the circulatory system: Secondary | ICD-10-CM | POA: Diagnosis not present

## 2018-05-13 DIAGNOSIS — K7200 Acute and subacute hepatic failure without coma: Principal | ICD-10-CM | POA: Diagnosis present

## 2018-05-13 DIAGNOSIS — R109 Unspecified abdominal pain: Secondary | ICD-10-CM | POA: Diagnosis not present

## 2018-05-13 DIAGNOSIS — I5032 Chronic diastolic (congestive) heart failure: Secondary | ICD-10-CM | POA: Diagnosis not present

## 2018-05-13 DIAGNOSIS — K219 Gastro-esophageal reflux disease without esophagitis: Secondary | ICD-10-CM | POA: Diagnosis not present

## 2018-05-13 DIAGNOSIS — D696 Thrombocytopenia, unspecified: Secondary | ICD-10-CM | POA: Diagnosis present

## 2018-05-13 DIAGNOSIS — N179 Acute kidney failure, unspecified: Secondary | ICD-10-CM | POA: Diagnosis present

## 2018-05-13 DIAGNOSIS — K7290 Hepatic failure, unspecified without coma: Secondary | ICD-10-CM | POA: Diagnosis present

## 2018-05-13 DIAGNOSIS — Z79899 Other long term (current) drug therapy: Secondary | ICD-10-CM

## 2018-05-13 DIAGNOSIS — Z96651 Presence of right artificial knee joint: Secondary | ICD-10-CM | POA: Diagnosis present

## 2018-05-13 DIAGNOSIS — I110 Hypertensive heart disease with heart failure: Secondary | ICD-10-CM | POA: Diagnosis not present

## 2018-05-13 DIAGNOSIS — I509 Heart failure, unspecified: Secondary | ICD-10-CM | POA: Diagnosis not present

## 2018-05-13 DIAGNOSIS — M170 Bilateral primary osteoarthritis of knee: Secondary | ICD-10-CM | POA: Diagnosis present

## 2018-05-13 DIAGNOSIS — R0602 Shortness of breath: Secondary | ICD-10-CM

## 2018-05-13 DIAGNOSIS — K5641 Fecal impaction: Secondary | ICD-10-CM | POA: Diagnosis not present

## 2018-05-13 DIAGNOSIS — T502X5A Adverse effect of carbonic-anhydrase inhibitors, benzothiadiazides and other diuretics, initial encounter: Secondary | ICD-10-CM | POA: Diagnosis not present

## 2018-05-13 DIAGNOSIS — K210 Gastro-esophageal reflux disease with esophagitis: Secondary | ICD-10-CM | POA: Diagnosis not present

## 2018-05-13 DIAGNOSIS — I10 Essential (primary) hypertension: Secondary | ICD-10-CM

## 2018-05-13 DIAGNOSIS — K580 Irritable bowel syndrome with diarrhea: Secondary | ICD-10-CM | POA: Diagnosis present

## 2018-05-13 DIAGNOSIS — R410 Disorientation, unspecified: Secondary | ICD-10-CM

## 2018-05-13 DIAGNOSIS — N289 Disorder of kidney and ureter, unspecified: Secondary | ICD-10-CM

## 2018-05-13 DIAGNOSIS — K7460 Unspecified cirrhosis of liver: Secondary | ICD-10-CM | POA: Diagnosis not present

## 2018-05-13 DIAGNOSIS — K766 Portal hypertension: Secondary | ICD-10-CM | POA: Diagnosis present

## 2018-05-13 DIAGNOSIS — E860 Dehydration: Secondary | ICD-10-CM | POA: Diagnosis present

## 2018-05-13 DIAGNOSIS — K7581 Nonalcoholic steatohepatitis (NASH): Secondary | ICD-10-CM | POA: Diagnosis not present

## 2018-05-13 DIAGNOSIS — Z7982 Long term (current) use of aspirin: Secondary | ICD-10-CM

## 2018-05-13 DIAGNOSIS — K7682 Hepatic encephalopathy: Secondary | ICD-10-CM | POA: Diagnosis present

## 2018-05-13 DIAGNOSIS — R188 Other ascites: Secondary | ICD-10-CM | POA: Diagnosis not present

## 2018-05-13 DIAGNOSIS — Z87442 Personal history of urinary calculi: Secondary | ICD-10-CM

## 2018-05-13 DIAGNOSIS — Z8679 Personal history of other diseases of the circulatory system: Secondary | ICD-10-CM

## 2018-05-13 DIAGNOSIS — I471 Supraventricular tachycardia: Secondary | ICD-10-CM | POA: Diagnosis not present

## 2018-05-13 DIAGNOSIS — Z8261 Family history of arthritis: Secondary | ICD-10-CM

## 2018-05-13 HISTORY — DX: Hepatic encephalopathy: K76.82

## 2018-05-13 HISTORY — DX: Hepatic failure, unspecified without coma: K72.90

## 2018-05-13 LAB — BASIC METABOLIC PANEL
BUN/Creatinine Ratio: 11 — ABNORMAL LOW (ref 12–28)
BUN: 28 mg/dL — ABNORMAL HIGH (ref 8–27)
CALCIUM: 10.7 mg/dL — AB (ref 8.7–10.3)
CO2: 19 mmol/L — AB (ref 20–29)
Chloride: 95 mmol/L — ABNORMAL LOW (ref 96–106)
Creatinine, Ser: 2.5 mg/dL — ABNORMAL HIGH (ref 0.57–1.00)
GFR calc Af Amer: 23 mL/min/{1.73_m2} — ABNORMAL LOW (ref >59)
GFR, EST NON AFRICAN AMERICAN: 20 mL/min/{1.73_m2} — AB (ref >59)
Glucose: 117 mg/dL — ABNORMAL HIGH (ref 65–99)
POTASSIUM: 4.7 mmol/L (ref 3.5–5.2)
SODIUM: 135 mmol/L (ref 134–144)

## 2018-05-13 LAB — COMPREHENSIVE METABOLIC PANEL
ALBUMIN: 3.5 g/dL (ref 3.5–5.0)
ALT: 27 U/L (ref 14–54)
ANION GAP: 15 (ref 5–15)
AST: 54 U/L — ABNORMAL HIGH (ref 15–41)
Alkaline Phosphatase: 101 U/L (ref 38–126)
BUN: 32 mg/dL — ABNORMAL HIGH (ref 6–20)
CHLORIDE: 100 mmol/L — AB (ref 101–111)
CO2: 18 mmol/L — AB (ref 22–32)
Calcium: 9.9 mg/dL (ref 8.9–10.3)
Creatinine, Ser: 2.65 mg/dL — ABNORMAL HIGH (ref 0.44–1.00)
GFR calc non Af Amer: 18 mL/min — ABNORMAL LOW (ref >60)
GFR, EST AFRICAN AMERICAN: 21 mL/min — AB (ref >60)
GLUCOSE: 132 mg/dL — AB (ref 65–99)
POTASSIUM: 4.3 mmol/L (ref 3.5–5.1)
SODIUM: 133 mmol/L — AB (ref 135–145)
Total Bilirubin: 2.1 mg/dL — ABNORMAL HIGH (ref 0.3–1.2)
Total Protein: 8 g/dL (ref 6.5–8.1)

## 2018-05-13 LAB — CBC WITH DIFFERENTIAL/PLATELET
Basophils Absolute: 0 10*3/uL (ref 0.0–0.1)
Basophils Relative: 0 %
Eosinophils Absolute: 0 10*3/uL (ref 0.0–0.7)
Eosinophils Relative: 0 %
HEMATOCRIT: 40.3 % (ref 36.0–46.0)
HEMOGLOBIN: 14.4 g/dL (ref 12.0–15.0)
LYMPHS ABS: 1.1 10*3/uL (ref 0.7–4.0)
LYMPHS PCT: 12 %
MCH: 29.9 pg (ref 26.0–34.0)
MCHC: 35.7 g/dL (ref 30.0–36.0)
MCV: 83.8 fL (ref 78.0–100.0)
MONOS PCT: 10 %
Monocytes Absolute: 0.9 10*3/uL (ref 0.1–1.0)
NEUTROS ABS: 7 10*3/uL (ref 1.7–7.7)
NEUTROS PCT: 78 %
Platelets: 174 10*3/uL (ref 150–400)
RBC: 4.81 MIL/uL (ref 3.87–5.11)
RDW: 14.6 % (ref 11.5–15.5)
WBC: 9 10*3/uL (ref 4.0–10.5)

## 2018-05-13 LAB — MAGNESIUM: MAGNESIUM: 2.3 mg/dL (ref 1.6–2.3)

## 2018-05-13 LAB — BRAIN NATRIURETIC PEPTIDE: B Natriuretic Peptide: 22.3 pg/mL (ref 0.0–100.0)

## 2018-05-13 LAB — AMMONIA: Ammonia: 163 umol/L — ABNORMAL HIGH (ref 9–35)

## 2018-05-13 LAB — TROPONIN I

## 2018-05-13 MED ORDER — SODIUM CHLORIDE 0.9 % IV SOLN
INTRAVENOUS | Status: DC
  Administered 2018-05-13: 125 mL/h via INTRAVENOUS
  Administered 2018-05-14: 02:00:00 via INTRAVENOUS

## 2018-05-13 MED ORDER — LACTULOSE 10 GM/15ML PO SOLN
10.0000 g | Freq: Once | ORAL | Status: AC
  Administered 2018-05-13: 10 g via ORAL

## 2018-05-13 MED ORDER — LORAZEPAM 2 MG/ML IJ SOLN
0.5000 mg | Freq: Once | INTRAMUSCULAR | Status: AC
  Administered 2018-05-13: 0.5 mg via INTRAVENOUS
  Filled 2018-05-13: qty 1

## 2018-05-13 MED ORDER — LACTULOSE 10 GM/15ML PO SOLN
ORAL | Status: AC
  Filled 2018-05-13: qty 30

## 2018-05-13 NOTE — ED Triage Notes (Addendum)
Pt c/o nausea x 1 week-weakness since hosp d/c on 5/13 and confusion x today-pt is alert/answering ?s-daughter who is a PA is filling in info-NAD-presents to triage in w/c

## 2018-05-13 NOTE — Plan of Care (Signed)
[facility] transfer discussed with Dr. Carmin Muskrat.  Dawn Moon is a 62 year old female with pmh of IBS, paroxysmal SVT, hypertension, and NASH; who underwent a right knee replacement on 4/4-4/5, and subsequently readmitted into the hospital from 5/4-5/14 for acute hypoxic respiratory failure 2/2 right pleural effusion (hepatic hydrothorax) and ascites.  Patient s/p thoracentesis on 5/5 with removal 2 L of fluid, repeat thoracentesis on 5/9 with 1.9 L of fluid removed, and paracentesis 5/7 with 2.9 L of fluid removed.   EF noted to be 60 to 65% without evidence of diastolic dysfunction.  Patient ultimately was discharged home on 80 mg of Lasix and 200 mg of Aldactone.    Patient presents tonight with nausea x 1 week of weakness and confusion .  Labs revealed acute renal failure creatinine 2.65, BUN 32, ammonia level 163.  CT imaging of the brain was negative for any acute abnormalities.  Patient was started on normal saline IV fluids at 125 mL/h and given 10 mg of lactulose.  Vital signs currently reported to be stable.  [facility] called to admit.  Accepted to a telemetry bed as inpatient.

## 2018-05-13 NOTE — ED Provider Notes (Signed)
[facility] EMERGENCY DEPARTMENT Provider Note   CSN: [id number] Arrival date & time: 05/13/18  1906     History   Chief Complaint Chief Complaint  Patient presents with  . Weakness    HPI Dawn Moon is a 62 y.o. female.  HPI Presents with concern of stupor, fatigue, dyspnea. Patient is here with her daughter, 1 of our colleagues in cardiology. Patient has multiple medical issues, including recent hospitalization due to fluid overload status, and has recently had a change in her spironolactone and Lasix dosing regimen. Since discharge about 11 days ago, patient has been progressive symptoms, and that she has recently had change in her medication regimen, by cardiology, she has persistent progression/decline. Patient denies focal pain, states that she feels unwell all over.  When she denies specific disorientation, notes that she feels lightheaded, slow to respond Patient's problems include [name]. She has had weight loss of average 1/2 pounds daily since discharge. Past Medical History:  Diagnosis Date  . GERD (gastroesophageal reflux disease)   . History of exercise stress test    03-07-2010  Stress echo--- no arrhythmias or conduction abnormalilites and negative for ischemia or chest pain  . History of kidney stones   . History of paroxysmal supraventricular tachycardia    episode 2011  consult w/ dr Caryl Comes --  put on atenolol--  per pt no longer an issue  . HTN (hypertension)    cardiologist --  dr Stanford Breed  . IBS (irritable bowel syndrome)    diarrhea  . Nonalcoholic steatohepatitis (NASH)   . Numbness and tingling of left lower extremity    post achilles tendon repair  . OA (osteoarthritis)    knees  . PONV (postoperative nausea and vomiting)   . Vitamin D deficiency   . Wears glasses     Patient Active Problem List   Diagnosis Date Noted  . History of paroxysmal supraventricular tachycardia   . SOB (shortness of breath)   . Acute hypoxemic  respiratory failure ([place]) 04/24/2018  . Pleural effusion on right 04/24/2018  . Leg edema, right 04/24/2018  . Ascites   . Hypoxia   . Liver cirrhosis secondary to NASH (nonalcoholic steatohepatitis) ([place]) 04/23/2018  . Osteoarthritis of right knee 03/24/2018  . IBS (irritable bowel syndrome) 06/15/2017  . GERD (gastroesophageal reflux disease) 06/15/2017  . HLD (hyperlipidemia)   . Fatty liver   . NSVT (nonsustained ventricular tachycardia) ([place])   . Nephrolithiasis   . Essential hypertension 02/13/2010    Past Surgical History:  Procedure Laterality Date  . ACHILLES TENDON SURGERY Left 06/2016  . BREAST BIOPSY Left 02/2016   benign  . COLONOSCOPY  2005  . CT CTA CORONARY W/CA SCORE W/CM &/OR WO/CM  01/01/2014   non-obstructive calcified plaque in pLAD (0-25%), no significant incidental noncardiac findings noted  . ESOPHAGOGASTRODUODENOSCOPY  01/2014  . EXTRACORPOREAL SHOCK WAVE LITHOTRIPSY  2010  . KNEE ARTHROPLASTY Right 03/24/2018   Procedure: RIGHT TOTAL KNEE ARTHROPLASTY WITH COMPUTER NAVIGATION;  Surgeon: Rod Can, MD;  Location: [facility];  Service: Orthopedics;  Laterality: Right;  Needs RNFA  . KNEE ARTHROSCOPY Right 12/04/2016   Procedure: ARTHROSCOPY KNEE WITH PARTIAL MEDIAL MENISCECTOMY;  Surgeon: Rod Can, MD;  Location: [facility];  Service: Orthopedics;  Laterality: Right;  . LEFT HEART CATHETERIZATION WITH CORONARY ANGIOGRAM N/A 01/11/2012   Procedure: LEFT HEART CATHETERIZATION WITH CORONARY ANGIOGRAM;  Surgeon: Sherren Mocha, MD;  Location: [facility] CATH LAB;  Service: Cardiovascular;  Laterality: N/A;  widely patent  coronary arterires without significant obstructive CAD,  normal LVF, ef 55-56%  . TONSILLECTOMY AND ADENOIDECTOMY  child  . TRANSTHORACIC ECHOCARDIOGRAM  01/18/2012   mild LVH,  ef 60%/  trivial TR  . TUBAL LIGATION  1985     OB History   None      Home Medications    Prior to Admission medications   Medication Sig Start  Date End Date Taking? Authorizing Provider  aspirin 81 MG chewable tablet Chew 1 tablet (81 mg total) by mouth 2 (two) times daily. 03/25/18   Swinteck, Aaron Edelman, MD  atenolol (TENORMIN) 25 MG tablet Take 1 tablet (25 mg total) by mouth every morning. 05/03/18   Dessa Phi, DO  Butalbital-APAP-Caffeine 50-300-40 MG CAPS Take 1 tablet by mouth every 8 (eight) hours as needed. Patient taking differently: Take 1 tablet by mouth every 8 (eight) hours as needed (for pain.).  01/10/18   Evelina Dun A, FNP  Eluxadoline (VIBERZI) 75 MG TABS Take 75 mg by mouth 2 (two) times daily. Patient taking differently: Take 75 mg by mouth 2 (two) times daily.  12/10/17   Sharion Balloon, FNP  furosemide (LASIX) 80 MG tablet Take 1 tablet (80 mg total) by mouth daily. 05/04/18   Dessa Phi, DO  HYDROcodone-acetaminophen (NORCO/VICODIN) 5-325 MG tablet Take 1 tablet by mouth every 6 (six) hours as needed for moderate pain.    [provider]  ondansetron (ZOFRAN) 4 MG tablet Take 1 tablet (4 mg total) by mouth every 8 (eight) hours as needed for nausea or vomiting. 05/06/18   Leamon Arnt, MD  pantoprazole (PROTONIX) 40 MG tablet Take 1 tablet (40 mg total) by mouth every morning. 05/03/18   Dessa Phi, DO  promethazine (PHENERGAN) 12.5 MG suppository Place 1 suppository (12.5 mg total) rectally every 6 (six) hours as needed for nausea or vomiting. 05/10/18   Leamon Arnt, MD  spironolactone (ALDACTONE) 100 MG tablet Take 100 mg by mouth daily.    [provider]    Family History Family History  Problem Relation Age of Onset  . Lymphoma Mother        non-hodgkins - died @ 59  . Coronary artery disease Mother   . Coronary artery disease Father        CABG in his 40s, died @ 47  . Diabetes Father   . Healthy Brother   . Hypertension Sister   . Arthritis Sister   . IgA nephropathy Daughter   . Celiac disease Daughter     Social History Social History   Tobacco Use  . Smoking  status: Never Smoker  . Smokeless tobacco: Never Used  Substance Use Topics  . Alcohol use: Not Currently  . Drug use: No     Allergies   Patient has no known allergies.   Review of Systems Review of Systems  Constitutional:       Per HPI, otherwise negative  HENT:       Per HPI, otherwise negative  Respiratory:       Per HPI, otherwise negative  Cardiovascular:       Per HPI, otherwise negative  Gastrointestinal: Negative for vomiting.  Endocrine:       Negative aside from HPI  Genitourinary:       Neg aside from HPI   Musculoskeletal:       Per HPI, otherwise negative  Skin: Negative.   Neurological: Positive for weakness. Negative for syncope.     Physical Exam Updated  Vital Signs BP 113/66   Pulse (!) 106   Temp 98.3 F (36.8 C) (Oral)   Resp 15   Ht 5' 4"  (1.626 m)   Wt 80.7 kg (178 lb)   SpO2 97%   BMI 30.55 kg/m   Physical Exam  Constitutional: She is oriented to person, place, and time. She appears well-developed and well-nourished. She appears listless. No distress.  HENT:  Head: Normocephalic and atraumatic.  Eyes: Conjunctivae and EOM are normal.  Cardiovascular: Regular rhythm. Tachycardia present.  Pulmonary/Chest: She has decreased breath sounds.  Abdominal: She exhibits no distension. There is no tenderness. There is no guarding.  Musculoskeletal: She exhibits edema.  Neurological: She is oriented to person, place, and time. She appears listless. No cranial nerve deficit.  Skin: Skin is warm and dry.  Psychiatric: She has a normal mood and affect.  Nursing note and vitals reviewed.    ED Treatments / Results  Labs (all labs ordered are listed, but only abnormal results are displayed) Labs Reviewed  COMPREHENSIVE METABOLIC PANEL - Abnormal; Notable for the following components:      Result Value   Sodium 133 (*)    Chloride 100 (*)    CO2 18 (*)    Glucose, Bld 132 (*)    BUN 32 (*)    Creatinine, Ser 2.65 (*)    AST 54 (*)     Total Bilirubin 2.1 (*)    GFR calc non Af Amer 18 (*)    GFR calc Af Amer 21 (*)    All other components within normal limits  AMMONIA - Abnormal; Notable for the following components:   Ammonia 163 (*)    All other components within normal limits  BRAIN NATRIURETIC PEPTIDE  CBC WITH DIFFERENTIAL/PLATELET  TROPONIN I  URINALYSIS, ROUTINE W REFLEX MICROSCOPIC    EKG None  Radiology Dg Chest 2 View  Result Date: 05/13/2018 CLINICAL DATA:  Short of breath EXAM: CHEST - 2 VIEW COMPARISON:  05/06/2018, 05/03/2018 FINDINGS: Clearing of previously noted pleural effusion. No acute consolidation or effusion. Normal heart size. No pneumothorax. Mild kyphosis of the spine with wedging at the thoracolumbar junction. IMPRESSION: No active cardiopulmonary disease. Electronically Signed   By: Donavan Foil M.D.   On: 05/13/2018 20:50    Procedures Procedures (including critical care time)  Medications Ordered in ED Medications  0.9 %  sodium chloride infusion (has no administration in time range)  LORazepam (ATIVAN) injection 0.5 mg (has no administration in time range)     Initial Impression / Assessment and Plan / ED Course  I have reviewed the triage vital signs and the nursing notes.  Pertinent labs & imaging results that were available during my care of the patient were reviewed by me and considered in my medical decision making (see chart for details).   On repeat exam the patient is in similar condition, weak in appearance, tachycardic.  9:44 PM On repeat exam the patient is in similar condition, tachycardic, weak in appearance, but oriented appropriately. I reviewed all findings with patient and her family members present with concern for acute kidney injury, confusion, as well as elevated ammonia level, patient will receive IV fluids, required transfer to our affiliated care center for further evaluation and management. Some suspicion for iatrogenic kidney injury given the  patient's use of spironolactone and Lasix, though the mental status changes likely due in part to ammonia elevation as well.  No early evidence for recurrent pleural effusion, nor evidence for concurrent  infection.  This elderly female presents almost 2 weeks after recent hospitalization for fluid overload status, with recurrent pleural effusion, now with confusion, weakness.  When she is found to be tachycardic, stuporous, but oriented x3. Patient has evidence of new acute kidney injury, and given the confusion, substantial comorbidities, need for balancing her fluid status, she required admission to our affiliated care center.  I discussed the patient's case with our hospitalist colleagues, and the patient will receive lactulose and in addition to resuscitation, transferred for further evaluation. Final Clinical Impressions(s) / ED Diagnoses  Acute kidney injury Confusion Hyperammonemia  CRITICAL CARE Performed by: Carmin Muskrat Total critical care time: 35 minutes Critical care time was exclusive of separately billable procedures and treating other patients. Critical care was necessary to treat or prevent imminent or life-threatening deterioration. Critical care was time spent personally by me on the following activities: development of treatment plan with patient and/or surrogate as well as nursing, discussions with consultants, evaluation of patient's response to treatment, examination of patient, obtaining history from patient or surrogate, ordering and performing treatments and interventions, ordering and review of laboratory studies, ordering and review of radiographic studies, pulse oximetry and re-evaluation of patient's condition.    Carmin Muskrat, MD 05/13/18 2222

## 2018-05-13 NOTE — Progress Notes (Signed)
bmp 

## 2018-05-14 ENCOUNTER — Inpatient Hospital Stay (HOSPITAL_COMMUNITY): Payer: 59

## 2018-05-14 DIAGNOSIS — I509 Heart failure, unspecified: Secondary | ICD-10-CM

## 2018-05-14 DIAGNOSIS — K7581 Nonalcoholic steatohepatitis (NASH): Secondary | ICD-10-CM

## 2018-05-14 DIAGNOSIS — K7460 Unspecified cirrhosis of liver: Secondary | ICD-10-CM

## 2018-05-14 DIAGNOSIS — Z8679 Personal history of other diseases of the circulatory system: Secondary | ICD-10-CM

## 2018-05-14 DIAGNOSIS — I10 Essential (primary) hypertension: Secondary | ICD-10-CM

## 2018-05-14 DIAGNOSIS — R0602 Shortness of breath: Secondary | ICD-10-CM

## 2018-05-14 DIAGNOSIS — K210 Gastro-esophageal reflux disease with esophagitis: Secondary | ICD-10-CM

## 2018-05-14 DIAGNOSIS — N179 Acute kidney failure, unspecified: Secondary | ICD-10-CM

## 2018-05-14 DIAGNOSIS — R109 Unspecified abdominal pain: Secondary | ICD-10-CM

## 2018-05-14 LAB — COMPREHENSIVE METABOLIC PANEL
ALT: 25 U/L (ref 14–54)
ANION GAP: 12 (ref 5–15)
AST: 48 U/L — ABNORMAL HIGH (ref 15–41)
Albumin: 3.2 g/dL — ABNORMAL LOW (ref 3.5–5.0)
Alkaline Phosphatase: 81 U/L (ref 38–126)
BILIRUBIN TOTAL: 1.8 mg/dL — AB (ref 0.3–1.2)
BUN: 32 mg/dL — AB (ref 6–20)
CALCIUM: 9.5 mg/dL (ref 8.9–10.3)
CO2: 20 mmol/L — ABNORMAL LOW (ref 22–32)
Chloride: 104 mmol/L (ref 101–111)
Creatinine, Ser: 2.4 mg/dL — ABNORMAL HIGH (ref 0.44–1.00)
GFR calc Af Amer: 24 mL/min — ABNORMAL LOW (ref >60)
GFR, EST NON AFRICAN AMERICAN: 21 mL/min — AB (ref >60)
Glucose, Bld: 124 mg/dL — ABNORMAL HIGH (ref 65–99)
POTASSIUM: 4.1 mmol/L (ref 3.5–5.1)
Sodium: 136 mmol/L (ref 135–145)
TOTAL PROTEIN: 7 g/dL (ref 6.5–8.1)

## 2018-05-14 LAB — URINALYSIS, ROUTINE W REFLEX MICROSCOPIC
Bilirubin Urine: NEGATIVE
Glucose, UA: NEGATIVE mg/dL
Ketones, ur: NEGATIVE mg/dL
Nitrite: NEGATIVE
Protein, ur: NEGATIVE mg/dL
SPECIFIC GRAVITY, URINE: 1.018 (ref 1.005–1.030)
pH: 5 (ref 5.0–8.0)

## 2018-05-14 LAB — AMMONIA: AMMONIA: 137 umol/L — AB (ref 9–35)

## 2018-05-14 LAB — CBC
HEMATOCRIT: 39 % (ref 36.0–46.0)
Hemoglobin: 12.8 g/dL (ref 12.0–15.0)
MCH: 29 pg (ref 26.0–34.0)
MCHC: 32.8 g/dL (ref 30.0–36.0)
MCV: 88.2 fL (ref 78.0–100.0)
Platelets: 148 10*3/uL — ABNORMAL LOW (ref 150–400)
RBC: 4.42 MIL/uL (ref 3.87–5.11)
RDW: 14.7 % (ref 11.5–15.5)
WBC: 7.4 10*3/uL (ref 4.0–10.5)

## 2018-05-14 MED ORDER — ASPIRIN 81 MG PO CHEW
81.0000 mg | CHEWABLE_TABLET | Freq: Two times a day (BID) | ORAL | Status: DC
  Administered 2018-05-14 – 2018-05-15 (×4): 81 mg via ORAL
  Filled 2018-05-14 (×4): qty 1

## 2018-05-14 MED ORDER — PANTOPRAZOLE SODIUM 40 MG PO TBEC
40.0000 mg | DELAYED_RELEASE_TABLET | Freq: Every morning | ORAL | Status: DC
  Administered 2018-05-14 – 2018-05-16 (×3): 40 mg via ORAL
  Filled 2018-05-14 (×3): qty 1

## 2018-05-14 MED ORDER — LACTULOSE 10 GM/15ML PO SOLN
30.0000 g | ORAL | Status: DC
  Administered 2018-05-14 (×3): 30 g via ORAL
  Filled 2018-05-14 (×4): qty 60

## 2018-05-14 MED ORDER — LACTULOSE 10 GM/15ML PO SOLN
30.0000 g | Freq: Three times a day (TID) | ORAL | Status: DC
  Administered 2018-05-14: 30 g via ORAL
  Filled 2018-05-14: qty 60

## 2018-05-14 MED ORDER — ONDANSETRON HCL 4 MG PO TABS: 4.0000 mg | ORAL_TABLET | Freq: Four times a day (QID) | ORAL | Status: DC | PRN

## 2018-05-14 MED ORDER — PROMETHAZINE HCL 25 MG/ML IJ SOLN
12.5000 mg | Freq: Four times a day (QID) | INTRAMUSCULAR | Status: DC | PRN
  Administered 2018-05-14 – 2018-05-17 (×5): 12.5 mg via INTRAVENOUS
  Filled 2018-05-14 (×5): qty 1

## 2018-05-14 MED ORDER — ONDANSETRON HCL 4 MG/2ML IJ SOLN
4.0000 mg | Freq: Four times a day (QID) | INTRAMUSCULAR | Status: DC | PRN
  Administered 2018-05-14: 4 mg via INTRAVENOUS
  Filled 2018-05-14: qty 2

## 2018-05-14 MED ORDER — ENSURE ENLIVE PO LIQD
237.0000 mL | Freq: Two times a day (BID) | ORAL | Status: DC
  Administered 2018-05-16 – 2018-05-19 (×6): 237 mL via ORAL

## 2018-05-14 MED ORDER — IPRATROPIUM-ALBUTEROL 0.5-2.5 (3) MG/3ML IN SOLN
3.0000 mL | RESPIRATORY_TRACT | Status: DC | PRN
Start: 2018-05-14 — End: 2018-05-19

## 2018-05-14 MED ORDER — HYDROCODONE-ACETAMINOPHEN 5-325 MG PO TABS
0.5000 | ORAL_TABLET | Freq: Four times a day (QID) | ORAL | Status: DC | PRN
  Administered 2018-05-15 – 2018-05-17 (×5): 1 via ORAL
  Filled 2018-05-14 (×6): qty 1

## 2018-05-14 MED ORDER — BISACODYL 10 MG RE SUPP
10.0000 mg | Freq: Every day | RECTAL | Status: DC | PRN
  Administered 2018-05-14: 10 mg via RECTAL
  Filled 2018-05-14: qty 1

## 2018-05-14 MED ORDER — HEPARIN SODIUM (PORCINE) 5000 UNIT/ML IJ SOLN
5000.0000 [IU] | Freq: Three times a day (TID) | INTRAMUSCULAR | Status: DC
  Administered 2018-05-14 – 2018-05-19 (×15): 5000 [IU] via SUBCUTANEOUS
  Filled 2018-05-14 (×16): qty 1

## 2018-05-14 NOTE — Progress Notes (Signed)
[facility] OF CARE NOTE Patient: Dawn Moon SUN:[id number]   PCP: Leamon Arnt, MD DOB: June 13, 1956   DOA: 05/13/2018   DOS: 05/14/2018    Patient was admitted by my colleague Dr. Tamala Julian earlier on 05/14/2018. I have reviewed the H&P as well as assessment and plan and agree with the same. Important changes in the plan are listed below.  Plan of care: Principal Problem:   Acute hepatic encephalopathy Active Problems:   Essential hypertension   GERD (gastroesophageal reflux disease)   Liver cirrhosis secondary to NASH (nonalcoholic steatohepatitis) (HCC)   History of paroxysmal supraventricular tachycardia   Heart failure ([facility])   Acute renal failure (ARF) ([facility])   Severe constipation. Increase lactulose. PRN soapsuds enema. Appreciate RNs assistance for manual disimpaction.  Cirrhosis of the liver with hepatic encephalopathy. GI consulted.  Acute renal failure, Likely from prerenal etiology as opposed to hepatorenal syndrome although will monitor.  Author: Berle Mull, MD [facility] Pager: [phone] 05/14/2018 5:28 PM   If 7PM-7AM, please contact night-coverage at [email or web address], password [facility]

## 2018-05-14 NOTE — H&P (Signed)
History and Physical    Dawn Moon:[id number] DOB: 1956/12/18 DOA: 05/13/2018  Referring MD/NP/PA: Carmin Muskrat, MD PCP: Leamon Arnt, MD  Patient coming from: [facility]  Chief Complaint: Confusion  I have personally briefly reviewed patient's old medical records in [place]   HPI: Dawn Moon is a 62 y.o. female with medical history significant of IBS, HFpEF, paroxysmal SVT, hypertension, and NASH;  who presents with complaints of nausea, weakness, and confusion over the last 1 week. Patient had recently been hospitalized to undergo right knee replacement from 4/4-4/5. Subsequently, patient was readmitted into the hospital from 5/4-5/14 for acute hypoxic respiratory failure 2/2 right pleural effusion related to hepatic hydrothorax and ascites.  Patient underwent thoracentesis on 5/5 with removal 2 L of fluid, paracentesis 5/7 with 2.9 L of fluid removed, and repeat thoracentesis on 5/9 with 1.9 L of fluid removed.    Echocardiogram noted patient's EF 60 to 65% without evidence of diastolic dysfunction.    She was was discharged home on 80 mg of Lasix and 200 mg of Aldactone.  Patient had been taking medications as prescribed since being discharged home, but has had a progressive decline.  She complains of  having difficulty with concentration and getting her words together.  Sleeping more than normal also no appetite.  She was seen by her cardiologist 4 days ago, and her Spironolactone dose had been decreased from 200 mg to 100 mg daily.  Patient reports associated symptoms of intermittent crampy abdominal pain, intermittent nausea, vomiting, right flank pain, shortness of breath with exertion, and weight loss of approximate 10-15 pounds since discharge. Patient weight at 5/14 was 194 lb.  Denies having any significant fevers, chest pain, loss of consciousness, or focal weakness.   ED Course:   Upon admission to the emergency department patient was noted to be  afebrile, pulse 99-120, respirations 13-27, and blood pressures 90/77-142/83, and O2 saturation maintained on room air.  Labs revealed acute renal failure creatinine 2.65, BUN 32, ammonia level 163. Chest x-ray was negative for any acute abnormalities.  Patient was started on normal saline IV fluids at 125 mL/h and given 10 mg of lactulose.  Vital signs currently reported to be stable.  TRH called to admit.  Accepted to a telemetry bed as inpatient.   Review of Systems  Constitutional: Positive for malaise/fatigue. Negative for chills and fever.  HENT: Negative for ear discharge and ear pain.   Eyes: Negative for double vision and photophobia.  Respiratory: Positive for shortness of breath.   Cardiovascular: Positive for leg swelling. Negative for chest pain.  Gastrointestinal: Positive for abdominal pain, nausea and vomiting.  Genitourinary: Positive for flank pain. Negative for dysuria.       Decreased urine output  Musculoskeletal: Positive for joint pain. Negative for falls.  Skin: Negative for rash.  Neurological: Positive for weakness. Negative for seizures and loss of consciousness.       Positive for confusion  Endo/Heme/Allergies: Does not bruise/bleed easily.  Psychiatric/Behavioral: Negative for substance abuse.    Past Medical History:  Diagnosis Date  . GERD (gastroesophageal reflux disease)   . History of exercise stress test    03-07-2010  Stress echo--- no arrhythmias or conduction abnormalilites and negative for ischemia or chest pain  . History of kidney stones   . History of paroxysmal supraventricular tachycardia    episode 2011  consult w/ dr Caryl Comes --  put on atenolol--  per pt no longer an issue  .  HTN (hypertension)    cardiologist --  dr Stanford Breed  . IBS (irritable bowel syndrome)    diarrhea  . Nonalcoholic steatohepatitis (NASH)   . Numbness and tingling of left lower extremity    post achilles tendon repair  . OA (osteoarthritis)    knees  . PONV  (postoperative nausea and vomiting)   . Vitamin D deficiency   . Wears glasses     Past Surgical History:  Procedure Laterality Date  . ACHILLES TENDON SURGERY Left 06/2016  . BREAST BIOPSY Left 02/2016   benign  . COLONOSCOPY  2005  . CT CTA CORONARY W/CA SCORE W/CM &/OR WO/CM  01/01/2014   non-obstructive calcified plaque in pLAD (0-25%), no significant incidental noncardiac findings noted  . ESOPHAGOGASTRODUODENOSCOPY  01/2014  . EXTRACORPOREAL SHOCK WAVE LITHOTRIPSY  2010  . KNEE ARTHROPLASTY Right 03/24/2018   Procedure: RIGHT TOTAL KNEE ARTHROPLASTY WITH COMPUTER NAVIGATION;  Surgeon: Rod Can, MD;  Location: [facility];  Service: Orthopedics;  Laterality: Right;  Needs RNFA  . KNEE ARTHROSCOPY Right 12/04/2016   Procedure: ARTHROSCOPY KNEE WITH PARTIAL MEDIAL MENISCECTOMY;  Surgeon: Rod Can, MD;  Location: [place];  Service: Orthopedics;  Laterality: Right;  . LEFT HEART CATHETERIZATION WITH CORONARY ANGIOGRAM N/A 01/11/2012   Procedure: LEFT HEART CATHETERIZATION WITH CORONARY ANGIOGRAM;  Surgeon: Sherren Mocha, MD;  Location: [facility];  Service: Cardiovascular;  Laterality: N/A;  widely patent coronary arterires without significant obstructive CAD,  normal LVF, ef 55-56%  . TONSILLECTOMY AND ADENOIDECTOMY  child  . TRANSTHORACIC ECHOCARDIOGRAM  01/18/2012   mild LVH,  ef 60%/  trivial TR  . TUBAL LIGATION  1985     reports that she has never smoked. She has never used smokeless tobacco. She reports that she drank alcohol. She reports that she does not use drugs.  No Known Allergies  Family History  Problem Relation Age of Onset  . Lymphoma Mother        non-hodgkins - died @ 66  . Coronary artery disease Mother   . Coronary artery disease Father        CABG in his 18s, died @ 68  . Diabetes Father   . Healthy Brother   . Hypertension Sister   . Arthritis Sister   . IgA nephropathy Daughter   . Celiac disease Daughter     Prior to  Admission medications   Medication Sig Start Date End Date Taking? Authorizing Provider  aspirin 81 MG chewable tablet Chew 1 tablet (81 mg total) by mouth 2 (two) times daily. 03/25/18   Swinteck, Aaron Edelman, MD  atenolol (TENORMIN) 25 MG tablet Take 1 tablet (25 mg total) by mouth every morning. 05/03/18   Dessa Phi, DO  Butalbital-APAP-Caffeine 50-300-40 MG CAPS Take 1 tablet by mouth every 8 (eight) hours as needed. Patient taking differently: Take 1 tablet by mouth every 8 (eight) hours as needed (for pain.).  01/10/18   Evelina Dun A, FNP  Eluxadoline (VIBERZI) 75 MG TABS Take 75 mg by mouth 2 (two) times daily. Patient taking differently: Take 75 mg by mouth 2 (two) times daily.  12/10/17   Sharion Balloon, FNP  furosemide (LASIX) 80 MG tablet Take 1 tablet (80 mg total) by mouth daily. 05/04/18   Dessa Phi, DO  HYDROcodone-acetaminophen (NORCO/VICODIN) 5-325 MG tablet Take 1 tablet by mouth every 6 (six) hours as needed for moderate pain.    [provider]  ondansetron (ZOFRAN) 4 MG tablet Take 1 tablet (4 mg  total) by mouth every 8 (eight) hours as needed for nausea or vomiting. 05/06/18   Leamon Arnt, MD  pantoprazole (PROTONIX) 40 MG tablet Take 1 tablet (40 mg total) by mouth every morning. 05/03/18   Dessa Phi, DO  promethazine (PHENERGAN) 12.5 MG suppository Place 1 suppository (12.5 mg total) rectally every 6 (six) hours as needed for nausea or vomiting. 05/10/18   Leamon Arnt, MD  spironolactone (ALDACTONE) 100 MG tablet Take 100 mg by mouth daily.    [provider]    Physical Exam:  Constitutional: Older female who appears to be fatigued, but able to follow commands and answer questions Vitals:   05/13/18 2230 05/13/18 2300 05/13/18 2330 05/14/18 0018  BP: 105/65 106/63 117/74 (!) 142/83  Pulse: (!) 101 99 (!) 104 (!) 108  Resp: 14 13 (!) 27 20  Temp:    97.6 F (36.4 C)  TempSrc:    Oral  SpO2: 95% 96% 95% 100%  Weight:      Height:        Eyes: PERRL, lids and conjunctivae normal ENMT: Mucous membranes are dry. Posterior pharynx clear of any exudate or lesions.  Neck: normal, supple, no masses, no thyromegaly.  No significant JVD noted Respiratory: Normal respiratory effort with some right lower lung base crackles appreciated, but no wheezes or rhonchi noted.   Cardiovascular: Mildly tachycardic, no murmurs / rubs / gallops. 1 + pitting extremity edema. 2+ pedal pulses. No carotid bruits.  Abdomen: Abdomen is soft with very mild distention.  Nontender to palpation. Bowel sounds positive.  Musculoskeletal: no clubbing / cyanosis. No joint deformity upper and lower extremities. Good ROM, no contractures. Normal muscle tone.  Skin: Healed right knee surgical scar.  No rashes, lesions, ulcers. No induration Neurologic: CN 2-12 grossly intact. Sensation intact, DTR normal. Strength 5/5 in all 4.  Psychiatric: Patient with slowed speech.  She is alert and oriented x3, but intermittently noted to be confused.   Labs on Admission: I have personally reviewed following labs and imaging studies  CBC: Recent Labs  Lab 05/13/18 1958  WBC 9.0  NEUTROABS 7.0  HGB 14.4  HCT 40.3  MCV 83.8  PLT 324   Basic Metabolic Panel: Recent Labs  Lab 05/13/18 1004 05/13/18 1958  NA 135 133*  K 4.7 4.3  CL 95* 100*  CO2 19* 18*  GLUCOSE 117* 132*  BUN 28* 32*  CREATININE 2.50* 2.65*  CALCIUM 10.7* 9.9  MG 2.3  --    GFR: Estimated Creatinine Clearance: 22.9 mL/min (A) (by C-G formula based on SCr of 2.65 mg/dL (H)). Liver Function Tests: Recent Labs  Lab 05/13/18 1958  AST 54*  ALT 27  ALKPHOS 101  BILITOT 2.1*  PROT 8.0  ALBUMIN 3.5   No results for input(s): LIPASE, AMYLASE in the last 168 hours. Recent Labs  Lab 05/13/18 1958  AMMONIA 163*   Coagulation Profile: No results for input(s): INR, PROTIME in the last 168 hours. Cardiac Enzymes: Recent Labs  Lab 05/13/18 1946  TROPONINI <0.03   BNP (last 3  results) No results for input(s): PROBNP in the last 8760 hours. HbA1C: No results for input(s): HGBA1C in the last 72 hours. CBG: No results for input(s): GLUCAP in the last 168 hours. Lipid Profile: No results for input(s): CHOL, HDL, LDLCALC, TRIG, CHOLHDL, LDLDIRECT in the last 72 hours. Thyroid Function Tests: No results for input(s): TSH, T4TOTAL, FREET4, T3FREE, THYROIDAB in the last 72 hours. Anemia Panel: No results for  input(s): VITAMINB12, FOLATE, FERRITIN, TIBC, IRON, RETICCTPCT in the last 72 hours. Urine analysis:    Component Value Date/Time   COLORURINE YELLOW 01/10/2012 1844   APPEARANCEUR CLEAR 01/10/2012 1844   LABSPEC 1.010 01/10/2012 1844   PHURINE 7.0 01/10/2012 1844   GLUCOSEU NEGATIVE 01/10/2012 1844   HGBUR NEGATIVE 01/10/2012 1844   BILIRUBINUR NEGATIVE 01/10/2012 1844   KETONESUR NEGATIVE 01/10/2012 1844   PROTEINUR NEGATIVE 01/10/2012 1844   UROBILINOGEN 0.2 01/10/2012 1844   NITRITE NEGATIVE 01/10/2012 1844   LEUKOCYTESUR NEGATIVE 01/10/2012 1844   Sepsis Labs: No results found for this or any previous visit (from the past 240 hour(s)).   Radiological Exams on Admission: Dg Chest 2 View  Result Date: 05/13/2018 CLINICAL DATA:  Short of breath EXAM: CHEST - 2 VIEW COMPARISON:  05/06/2018, 05/03/2018 FINDINGS: Clearing of previously noted pleural effusion. No acute consolidation or effusion. Normal heart size. No pneumothorax. Mild kyphosis of the spine with wedging at the thoracolumbar junction. IMPRESSION: No active cardiopulmonary disease. Electronically Signed   By: Donavan Foil M.D.   On: 05/13/2018 20:50    EKG: Independently reviewed.  Sinus tachycardia 111 bpm with T wave abnormality  Assessment/Plan Acute hepatic encephalopathy:  Patient presents with weakness and worsening confusion.  Ammonia level was elevated 163, and likely compounded as patient appears to be significantly dehydrated. - Admit to a telemetry bed  - Neurochecks -  Lactulose 30 grams 3 times daily with a goal - Check ammonia levels daily   Acute renal failure 2/2 dehydration/ over diuresed: Patient previously noted to have creatinine within normal limits just prior to being discharged.  On admission however creatinine 2.65 with BUN 32.  Suspect over diuresis with Lasix and spironolactone.  Patient received a total of 1 L normal saline IV fluids while at the emergency department. - Strict I&O's  - Check renal ultrasound - IV fluids at 100 mL/h as tolerated - Check renal ultrasound  [name] cirrhosis: CT of abdomen obtained on 04/23/2018 suggested cirrhotic changes with ascites requiring paracentesis during last hospitalization with removal of almost 2.9 L of ascitic fluid that did not reveal signs of infection. - Check PT/INR - Holding furosemide and Spironolactone due to acute kidney injury  Heart failure with preserved EF: Patient appears to be hypovolemic at this time.  Previous EF noted to be 60-65% on 5/6.  Weight at discharge was noted to be 194 pounds and presents with a weight of 178. - Daily weights  Essential hypertension, history of PSVT - Continue atenolol  Dyspnea: On admission patient complains of shortness of breath with exertion, but chest x-ray otherwise appears to be clear.  No signs of recurrent pleural effusion. - Continuous pulse oximetry with nasal cannula oxygen overnight as needed - Breathing treatments as needed SOB/Wheezing  Status post right knee replacement: Right knee appears to be healing well with no signs of infection. - Continue hydrocodone as needed  GERD - Continue Protonix   DVT prophylaxis: heparin   Code Status: Full Family Communication: Discussed plan of care with the patient with husband sleeping at bedside. Disposition Plan: Likely discharge home once medically stable Consults called: none  Admission status: inpatient  Norval Morton MD [facility] Pager [phone]   If 7PM-7AM, please  contact night-coverage [email or web address] Password [email or web address]  05/14/2018, 1:48 AM

## 2018-05-14 NOTE — Plan of Care (Signed)
VSS, patient medicated for nausea x 2 with improvement.  Denies pain.  Patient disimpacted as per previous note and had another BM prior to end of shift.  Receiving Lactulose q2 hours at this time. Educated patient and family on the role of lactulose to aid with reducing ammonia level.  Patient alert and oriented x 4, family can tell she is still a little confused about some details but overall much improved from admission.  Daughters and husband at bedside throughout shift.

## 2018-05-14 NOTE — Progress Notes (Signed)
Patient severely constipated, unable to pass any stool in spite of lactulose and Dulcolax suppository.  Order obtained for soap suds enema, RN noted that patient was impacted with a large amount of hard stool right at rectum. RN manually disimpacted large amounts of hard stool and administered soap suds enema as ordered.  Patient verbalized some relief.

## 2018-05-15 DIAGNOSIS — K7200 Acute and subacute hepatic failure without coma: Principal | ICD-10-CM

## 2018-05-15 LAB — CBC
HEMATOCRIT: 40.2 % (ref 36.0–46.0)
Hemoglobin: 13.1 g/dL (ref 12.0–15.0)
MCH: 28.9 pg (ref 26.0–34.0)
MCHC: 32.6 g/dL (ref 30.0–36.0)
MCV: 88.5 fL (ref 78.0–100.0)
Platelets: 155 10*3/uL (ref 150–400)
RBC: 4.54 MIL/uL (ref 3.87–5.11)
RDW: 14.9 % (ref 11.5–15.5)
WBC: 8.4 10*3/uL (ref 4.0–10.5)

## 2018-05-15 LAB — MAGNESIUM: Magnesium: 2.1 mg/dL (ref 1.7–2.4)

## 2018-05-15 LAB — COMPREHENSIVE METABOLIC PANEL
ALBUMIN: 3.4 g/dL — AB (ref 3.5–5.0)
ALT: 33 U/L (ref 14–54)
AST: 66 U/L — ABNORMAL HIGH (ref 15–41)
Alkaline Phosphatase: 94 U/L (ref 38–126)
Anion gap: 12 (ref 5–15)
BILIRUBIN TOTAL: 2.4 mg/dL — AB (ref 0.3–1.2)
BUN: 31 mg/dL — ABNORMAL HIGH (ref 6–20)
CALCIUM: 9.9 mg/dL (ref 8.9–10.3)
CO2: 19 mmol/L — ABNORMAL LOW (ref 22–32)
Chloride: 106 mmol/L (ref 101–111)
Creatinine, Ser: 1.69 mg/dL — ABNORMAL HIGH (ref 0.44–1.00)
GFR calc non Af Amer: 32 mL/min — ABNORMAL LOW (ref >60)
GFR, EST AFRICAN AMERICAN: 37 mL/min — AB (ref >60)
Glucose, Bld: 121 mg/dL — ABNORMAL HIGH (ref 65–99)
POTASSIUM: 4.4 mmol/L (ref 3.5–5.1)
Sodium: 137 mmol/L (ref 135–145)
Total Protein: 7.6 g/dL (ref 6.5–8.1)

## 2018-05-15 LAB — AMMONIA: AMMONIA: 67 umol/L — AB (ref 9–35)

## 2018-05-15 LAB — PROTIME-INR
INR: 1.28
Prothrombin Time: 15.9 seconds — ABNORMAL HIGH (ref 11.4–15.2)

## 2018-05-15 MED ORDER — SODIUM CHLORIDE 0.9 % IV SOLN
INTRAVENOUS | Status: DC
  Administered 2018-05-15 – 2018-05-17 (×5): via INTRAVENOUS

## 2018-05-15 MED ORDER — LORAZEPAM 2 MG/ML IJ SOLN
0.5000 mg | Freq: Once | INTRAMUSCULAR | Status: AC
Start: 2018-05-15 — End: 2018-05-15
  Administered 2018-05-15: 0.5 mg via INTRAVENOUS
  Filled 2018-05-15: qty 1

## 2018-05-15 MED ORDER — RIFAXIMIN 550 MG PO TABS
550.0000 mg | ORAL_TABLET | Freq: Two times a day (BID) | ORAL | Status: DC
  Administered 2018-05-15 – 2018-05-19 (×9): 550 mg via ORAL
  Filled 2018-05-15 (×9): qty 1

## 2018-05-15 MED ORDER — LACTULOSE 10 GM/15ML PO SOLN
30.0000 g | Freq: Two times a day (BID) | ORAL | Status: DC
  Filled 2018-05-15: qty 60

## 2018-05-15 MED ORDER — SODIUM CHLORIDE 0.9 % IV SOLN
INTRAVENOUS | Status: DC
  Administered 2018-05-15: 12:00:00 via INTRAVENOUS
  Filled 2018-05-15 (×2): qty 1000

## 2018-05-15 MED ORDER — LACTULOSE 10 GM/15ML PO SOLN: 30.0000 g | Freq: Three times a day (TID) | ORAL | Status: DC

## 2018-05-15 MED ORDER — SODIUM CHLORIDE 0.9 % IV BOLUS
250.0000 mL | Freq: Once | INTRAVENOUS | Status: AC
  Administered 2018-05-15: 250 mL via INTRAVENOUS

## 2018-05-15 NOTE — Progress Notes (Signed)
Pt complaining of unrelieved nausea after phenergan given. NP on call paged. While waiting for orders pt HR began to sustain in the 110-119s. NP notified of HR. No new orders placed. RN paged again when HR began to sustain in the 120s-130s. NP placed order for one time dose of IV ativan. Orders carried out. Will continue to monitor.

## 2018-05-15 NOTE — Progress Notes (Signed)
[facility] Progress Note  Patient: Dawn Moon ACZ:[id number]   PCP: Leamon Arnt, MD DOB: [birth year]   DOA: 05/13/2018   DOS: 05/15/2018   Date of Service: the patient was seen and examined on 05/15/2018  Subjective: Patient is feeling fatigued and tired.  Had 10-11 bowel movements last night after correction of bowel impaction.  No nausea at the time of my evaluation but continues to feel nauseous throughout the day. No significant abdominal pain. Complains about frontal headache which is continuous as well.  Brief hospital course: Pt. with PMH of IBS, HFpEF, paroxysmal SVT, hypertension, andNASH; admitted on 05/13/2018, presented with complaint of nausea and weakness and confusion, was found to have hepatic encephalopathy with ammonia level of 167, constipation, acute kidney injury due to dehydration Currently further plan is continue current management.  Assessment and Plan: 1.  Acute hepatic encephalopathy. Cirrhosis of the liver due to [place]. Bowel impaction Ammonia level 163 on admission. Currently 54. Patient was constipated requiring enema yesterday. After manual disimpaction, patient had multiple rounds of diarrhea yesterday. Currently lactulose dose has been reduced. GI consulted appreciate input. Monitor.  Rifaximin has been added to the patient's regimen as well. Holding Lasix and Aldactone.  Checking INR.  2.  Acute kidney injury. Secondary to dehydration. Patient was on high dose of Lasix 200 mg Aldactone as well. Dose has been recently reduced. Presents with serum creatinine of 2.62 and BUN of 32. Currently it is trending down with holding Lasix. We will continue with hydration for now due to multiple episodes of diarrhea last night. Also increase the rate of the IV fluid. Recheck renal function and volume status tomorrow and back off on the fluids. Renal ultrasound unremarkable for any acute abnormality.  3.  Essential hypertension. Blood pressure  stable. Continue atenolol.  4.  Chronic diastolic CHF. On Lasix and Aldactone. Currently holding them. Receiving IV hydration, monitor for volume overload.  5.  Frontal headache. From dehydration. Continue IV hydration for now.  6.  GERD. Continuing PPI.  7.  Recent right total knee replacement. Started on aspirin twice daily postoperatively. Discussed with orthopedic, recommend that the patient does not need aspirin beyond 1 month. Patient is well beyond 1 month for his surgery and therefore will discontinue aspirin to minimize risk for bleeding in patient with cirrhosis.  Diet: Low-salt diet DVT Prophylaxis: subcutaneous Heparin  Advance goals of care discussion: Full code  Family Communication: family was present at bedside, at the time of interview. The pt provided permission to discuss medical plan with the family. Opportunity was given to ask question and all questions were answered satisfactorily.   Disposition:  Discharge to home.  Consultants: gastroenterology  Procedures: none  Antibiotics: Anti-infectives (From admission, onward)   Start     Dose/Rate Route Frequency Ordered Stop   05/15/18 1100  rifaximin (XIFAXAN) tablet 550 mg     550 mg Oral 2 times daily 05/15/18 0959         Objective: Physical Exam: Vitals:   05/14/18 2032 05/15/18 0444 05/15/18 0620 05/15/18 1441  BP: (!) 142/75 (!) 148/89 (!) 161/90 (!) 142/78  Pulse: (!) 109 (!) 108 (!) 116 (!) 109  Resp: 18 18  18   Temp: 98 F (36.7 C) 98.7 F (37.1 C) 98.6 F (37 C) 97.8 F (36.6 C)  TempSrc: Oral Oral Oral Oral  SpO2: 96% 98%  98%  Weight:   77.7 kg (171 lb 4.8 oz)   Height:  Intake/Output Summary (Last 24 hours) at 05/15/2018 1854 Last data filed at 05/15/2018 1624 Gross per 24 hour  Intake 380 ml  Output 1050 ml  Net -670 ml   Filed Weights   05/13/18 1914 05/14/18 0534 05/15/18 0620  Weight: 80.7 kg (178 lb) 81.8 kg (180 lb 5.4 oz) 77.7 kg (171 lb 4.8 oz)   General:  Alert, Awake and Oriented to Time, Place and Person. Appear in mild distress, affect appropriate Eyes: PERRL, Conjunctiva normal ENT: Oral Mucosa clear moist. Neck: difficult to assess JVD, no Abnormal Mass Or lumps Cardiovascular: S1 and S2 Present, no Murmur, Peripheral Pulses Present Respiratory: normal respiratory effort, Bilateral Air entry equal and Decreased, no use of accessory muscle, Clear to Auscultation, no Crackles, no wheezes Abdomen: Bowel Sound present, Soft and no tenderness, no hernia Skin: no redness, no Rash, no induration Extremities: no Pedal edema, no calf tenderness Neurologic: Grossly no focal neuro deficit. Bilaterally Equal motor strength   Data Reviewed: CBC: Recent Labs  Lab 05/13/18 1958 05/14/18 0425 05/15/18 0514  WBC 9.0 7.4 8.4  NEUTROABS 7.0  --   --   HGB 14.4 12.8 13.1  HCT 40.3 39.0 40.2  MCV 83.8 88.2 88.5  PLT 174 148* 410   Basic Metabolic Panel: Recent Labs  Lab 05/13/18 1004 05/13/18 1958 05/14/18 0425 05/15/18 0514  NA 135 133* 136 137  K 4.7 4.3 4.1 4.4  CL 95* 100* 104 106  CO2 19* 18* 20* 19*  GLUCOSE 117* 132* 124* 121*  BUN 28* 32* 32* 31*  CREATININE 2.50* 2.65* 2.40* 1.69*  CALCIUM 10.7* 9.9 9.5 9.9  MG 2.3  --   --  2.1    Liver Function Tests: Recent Labs  Lab 05/13/18 1958 05/14/18 0425 05/15/18 0514  AST 54* 48* 66*  ALT 27 25 33  ALKPHOS 101 81 94  BILITOT 2.1* 1.8* 2.4*  PROT 8.0 7.0 7.6  ALBUMIN 3.5 3.2* 3.4*   No results for input(s): LIPASE, AMYLASE in the last 168 hours. Recent Labs  Lab 05/13/18 1958 05/14/18 0425 05/15/18 0514  AMMONIA 163* 137* 67*   Coagulation Profile: Recent Labs  Lab 05/15/18 1157  INR 1.28   Cardiac Enzymes: Recent Labs  Lab 05/13/18 1946  TROPONINI <0.03   BNP (last 3 results) No results for input(s): PROBNP in the last 8760 hours. CBG: No results for input(s): GLUCAP in the last 168 hours. Studies: No results found.  Scheduled Meds: . feeding  supplement (ENSURE ENLIVE)  237 mL Oral BID BM  . heparin  5,000 Units Subcutaneous Q8H  . lactulose  30 g Oral BID  . pantoprazole  40 mg Oral q morning - 10a  . rifaximin  550 mg Oral BID   Continuous Infusions: . sodium chloride 75 mL/hr at 05/15/18 1256  . sodium chloride 0.9 % 1,000 mL infusion 50 mL/hr at 05/15/18 1223   PRN Meds: bisacodyl, HYDROcodone-acetaminophen, ipratropium-albuterol, promethazine  Time spent: 35 minutes  Author: Berle Mull, MD [facility] Pager: [phone] 05/15/2018 6:54 PM  If 7PM-7AM, please contact night-coverage at [email or web address], password [email or web address]

## 2018-05-15 NOTE — Consult Note (Addendum)
Referring Provider: Dr. Posey Pronto, [facility] Primary Care Physician:  Leamon Arnt, MD Primary Gastroenterologist:  Dr. Carlean Purl  Reason for Consultation:  Cirrhosis  HPI: Dawn Moon is a 62 y.o. female with medical history significant ofIBS-D, HFpEF, paroxysmal SVT, hypertension, andNASH.  She presented to [facility] ED with complaints of nausea, weakness, and confusion over the last 1 week. Patient had recently been hospitalized to undergo right knee replacement from 4/4-4/5.Subsequently, patient was readmitted into the hospital from 5/4-5/14for acute hypoxic respiratory failure 2/2right pleural effusion related to hepatic hydrothorax and ascites.  Patient underwentthoracentesison 5/5 with removal2 L of fluid, paracentesis 5/7 with 2.9 L of fluid removed, and repeat thoracentesis on 5/9 with 1.9 L of fluid removed.  Echocardiogram noted patient's EF 60 to 65% without evidence of diastolic dysfunction.   She was was discharged home on 80 mg of Lasix and 200 mg of Aldactone.  Patient had been taking medications as prescribed since being discharged home, but has had a progressive decline.  She complains of  having difficulty with concentration and getting her words together.  Sleeping more than normal also no appetite.  She was seen by her cardiologist 4 days prior to admission, and her Spironolactone dose had been decreased from 200 mg to 100 mg daily.  Has not been drinking much fluids at all.  Patient weight at 5/14 was 194 lb.   ED Course:  Upon admission to the emergency department patient was noted to be afebrile, pulse 99-120, respirations 13-27, and blood pressures 90/77-142/83, and O2 saturation maintained on room air.  Labs revealed acute renal failure creatinine 2.65, BUN 32, ammonia level 163. Chest x-ray was negative for any acute abnormalities. Patient was started on normal saline IV fluids at 125 mL/h and given 10 mg oflactulose. Vital signs currently reported to be stable. TRH  called to admit.  Patient did not have a BM so lactulose was increased.  Once she had a BM at [facility] last night she had several others, about 11 or 12 since then, was up all night.  Ammonia level down to 67 this AM.  Cr down to 1.69.  Diuretics are on hold.    Past Medical History:  Diagnosis Date  . GERD (gastroesophageal reflux disease)   . History of exercise stress test    03-07-2010  Stress echo--- no arrhythmias or conduction abnormalilites and negative for ischemia or chest pain  . History of kidney stones   . History of paroxysmal supraventricular tachycardia    episode 2011  consult w/ dr Caryl Comes --  put on atenolol--  per pt no longer an issue  . HTN (hypertension)    cardiologist --  dr Stanford Breed  . IBS (irritable bowel syndrome)    diarrhea  . Nonalcoholic steatohepatitis (NASH)   . Numbness and tingling of left lower extremity    post achilles tendon repair  . OA (osteoarthritis)    knees  . PONV (postoperative nausea and vomiting)   . Vitamin D deficiency   . Wears glasses     Past Surgical History:  Procedure Laterality Date  . ACHILLES TENDON SURGERY Left 06/2016  . BREAST BIOPSY Left 02/2016   benign  . COLONOSCOPY  2005  . CT CTA CORONARY W/CA SCORE W/CM &/OR WO/CM  01/01/2014   non-obstructive calcified plaque in pLAD (0-25%), no significant incidental noncardiac findings noted  . ESOPHAGOGASTRODUODENOSCOPY  01/2014  . EXTRACORPOREAL SHOCK WAVE LITHOTRIPSY  2010  . KNEE ARTHROPLASTY Right 03/24/2018   Procedure: RIGHT  TOTAL KNEE ARTHROPLASTY WITH COMPUTER NAVIGATION;  Surgeon: Rod Can, MD;  Location: [facility];  Service: Orthopedics;  Laterality: Right;  Needs RNFA  . KNEE ARTHROSCOPY Right 12/04/2016   Procedure: ARTHROSCOPY KNEE WITH PARTIAL MEDIAL MENISCECTOMY;  Surgeon: Rod Can, MD;  Location: [place];  Service: Orthopedics;  Laterality: Right;  . LEFT HEART CATHETERIZATION WITH CORONARY ANGIOGRAM N/A 01/11/2012   Procedure: LEFT  HEART CATHETERIZATION WITH CORONARY ANGIOGRAM;  Surgeon: Sherren Mocha, MD;  Location: [facility] CATH LAB;  Service: Cardiovascular;  Laterality: N/A;  widely patent coronary arterires without significant obstructive CAD,  normal LVF, ef 55-56%  . TONSILLECTOMY AND ADENOIDECTOMY  child  . TRANSTHORACIC ECHOCARDIOGRAM  01/18/2012   mild LVH,  ef 60%/  trivial TR  . TUBAL LIGATION  1985    Prior to Admission medications   Medication Sig Start Date End Date Taking? Authorizing Provider  aspirin 81 MG chewable tablet Chew 1 tablet (81 mg total) by mouth 2 (two) times daily. 03/25/18  Yes Swinteck, Aaron Edelman, MD  Butalbital-APAP-Caffeine 50-300-40 MG CAPS Take 1 tablet by mouth every 8 (eight) hours as needed. Patient taking differently: Take 1 tablet by mouth every 8 (eight) hours as needed (for pain.).  01/10/18  Yes Hawks, Christy A, FNP  Eluxadoline (VIBERZI) 75 MG TABS Take 75 mg by mouth 2 (two) times daily. Patient taking differently: Take 75 mg by mouth 2 (two) times daily.  12/10/17  Yes Hawks, Christy A, FNP  furosemide (LASIX) 80 MG tablet Take 1 tablet (80 mg total) by mouth daily. 05/04/18  Yes Dessa Phi, DO  HYDROcodone-acetaminophen (NORCO/VICODIN) 5-325 MG tablet Take 0.5-1 tablets by mouth every 6 (six) hours as needed for moderate pain.    Yes [provider]  ondansetron (ZOFRAN) 4 MG tablet Take 1 tablet (4 mg total) by mouth every 8 (eight) hours as needed for nausea or vomiting. 05/06/18  Yes Leamon Arnt, MD  pantoprazole (PROTONIX) 40 MG tablet Take 1 tablet (40 mg total) by mouth every morning. 05/03/18  Yes Dessa Phi, DO  promethazine (PHENERGAN) 12.5 MG suppository Place 1 suppository (12.5 mg total) rectally every 6 (six) hours as needed for nausea or vomiting. 05/10/18  Yes Leamon Arnt, MD  spironolactone (ALDACTONE) 100 MG tablet Take 100 mg by mouth daily.   Yes [provider]  atenolol (TENORMIN) 25 MG tablet Take 1 tablet (25 mg total) by mouth every  morning. Patient not taking: Reported on 05/14/2018 05/03/18   Dessa Phi, DO    Current Facility-Administered Medications  Medication Dose Route Frequency Provider Last Rate Last Dose  . aspirin chewable tablet 81 mg  81 mg Oral BID Fuller Plan A, MD   81 mg at 05/14/18 2138  . bisacodyl (DULCOLAX) suppository 10 mg  10 mg Rectal Daily PRN Lavina Hamman, MD   10 mg at 05/14/18 1457  . feeding supplement (ENSURE ENLIVE) (ENSURE ENLIVE) liquid 237 mL  237 mL Oral BID BM Smith, Rondell A, MD      . heparin injection 5,000 Units  5,000 Units Subcutaneous Q8H Fuller Plan A, MD   5,000 Units at 05/15/18 0528  . HYDROcodone-acetaminophen (NORCO/VICODIN) 5-325 MG per tablet 0.5-1 tablet  0.5-1 tablet Oral Q6H PRN Norval Morton, MD   1 tablet at 05/15/18 0351  . ipratropium-albuterol (DUONEB) 0.5-2.5 (3) MG/3ML nebulizer solution 3 mL  3 mL Nebulization Q4H PRN Smith, Rondell A, MD      . lactulose (CHRONULAC) 10 GM/15ML solution 30  g  30 g Oral TID Lavina Hamman, MD      . pantoprazole (PROTONIX) EC tablet 40 mg  40 mg Oral q morning - 10a Fuller Plan A, MD   40 mg at 05/14/18 0929  . promethazine (PHENERGAN) injection 12.5 mg  12.5 mg Intravenous Q6H PRN Lavina Hamman, MD   12.5 mg at 05/15/18 0524    Allergies as of 05/13/2018  . (No Known Allergies)    Family History  Problem Relation Age of Onset  . Lymphoma Mother        non-hodgkins - died @ 57  . Coronary artery disease Mother   . Coronary artery disease Father        CABG in his 24s, died @ 73  . Diabetes Father   . Healthy Brother   . Hypertension Sister   . Arthritis Sister   . IgA nephropathy Daughter   . Celiac disease Daughter     Social History   Socioeconomic History  . Marital status: Married    Spouse name: Not on file  . Number of children: Not on file  . Years of education: Not on file  . Highest education level: Not on file  Occupational History  . Occupation: inpatient case Programme researcher, broadcasting/film/video: [employer]  . Financial resource strain: Not on file  . Food insecurity:    Worry: Not on file    Inability: Not on file  . Transportation needs:    Medical: Not on file    Non-medical: Not on file  Tobacco Use  . Smoking status: Never Smoker  . Smokeless tobacco: Never Used  Substance and Sexual Activity  . Alcohol use: Not Currently  . Drug use: No  . Sexual activity: Not on file  Lifestyle  . Physical activity:    Days per week: Not on file    Minutes per session: Not on file  . Stress: Not on file  Relationships  . Social connections:    Talks on phone: Not on file    Gets together: Not on file    Attends religious service: Not on file    Active member of club or organization: Not on file    Attends meetings of clubs or organizations: Not on file    Relationship status: Not on file  . Intimate partner violence:    Fear of current or ex partner: Not on file    Emotionally abused: Not on file    Physically abused: Not on file    Forced sexual activity: Not on file  Other Topics Concern  . 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  Has to walk up stairs to work and can do w/o Ss.  Lives @ home in [place] with family.    Review of Systems: ROS is O/W negative except as mentioned in HPI.  Physical Exam: Vital signs in last 24 hours: Temp:  [98 F (36.7 C)-98.7 F (37.1 C)] 98.6 F (37 C) (05/26 0620) Pulse Rate:  [108-116] 116 (05/26 0620) Resp:  [16-18] 18 (05/26 0444) BP: (126-161)/(66-90) 161/90 (05/26 0620) SpO2:  [96 %-100 %] 98 % (05/26 0444) Weight:  [171 lb 4.8 oz (77.7 kg)] 171 lb 4.8 oz (77.7 kg) (05/26 0620)  Last BM Date: 05/14/18 General:  Aler, but sleepy, well-developed, well-nourished, pleasant and cooperative in NAD Head:  Normocephalic and  atraumatic. Eyes:  Sclera clear, no icterus.  Conjunctiva pink. Ears:  Normal auditory acuity. Mouth:  No deformity or lesions.   Lungs:  Clear throughout to auscultation.  No wheezes, crackles, or rhonchi.  Heart:  Tachy, no murmurs noted. Abdomen:  Soft, non-distended.  BS present.  Mild right sided TTP. Msk:  Symmetrical without gross deformities. Pulses:  Normal pulses noted. Extremities:  1+ pitting edema in B/L LE's. Neurologic:  Alert and oriented x 4;  grossly normal neurologically. Skin:  Intact without significant lesions or rashes. Psych:  Alert and cooperative. Normal mood and affect.  Intake/Output from previous day: 05/25 0701 - 05/26 0700 In: -  Out: 1400 [Urine:350; Stool:1050]  Lab Results: Recent Labs    05/13/18 1958 05/14/18 0425 05/15/18 0514  WBC 9.0 7.4 8.4  HGB 14.4 12.8 13.1  HCT 40.3 39.0 40.2  PLT 174 148* 155   BMET Recent Labs    05/13/18 1958 05/14/18 0425 05/15/18 0514  NA 133* 136 137  K 4.3 4.1 4.4  CL 100* 104 106  CO2 18* 20* 19*  GLUCOSE 132* 124* 121*  BUN 32* 32* 31*  CREATININE 2.65* 2.40* 1.69*  CALCIUM 9.9 9.5 9.9   LFT Recent Labs    05/15/18 0514  PROT 7.6  ALBUMIN 3.4*  AST 66*  ALT 33  ALKPHOS 94  BILITOT 2.4*   Studies/Results: Dg Chest 2 View  Result Date: 05/13/2018 CLINICAL DATA:  Short of breath EXAM: CHEST - 2 VIEW COMPARISON:  05/06/2018, 05/03/2018 FINDINGS: Clearing of previously noted pleural effusion. No acute consolidation or effusion. Normal heart size. No pneumothorax. Mild kyphosis of the spine with wedging at the thoracolumbar junction. IMPRESSION: No active cardiopulmonary disease. Electronically Signed   By: Donavan Foil M.D.   On: 05/13/2018 20:50   US Renal  Result Date: 05/14/2018 CLINICAL DATA:  Right flank pain.  Hypertension. EXAM: RENAL / URINARY TRACT ULTRASOUND COMPLETE COMPARISON:  CT abdomen and pelvis Apr 23, 2018 FINDINGS: Right Kidney: Length: 10.6 cm. Echogenicity and renal  cortical thickness are within normal limits. No mass, perinephric fluid, or hydronephrosis visualized. No sonographically demonstrable calculus or ureterectasis. Left Kidney: Length: 11.2 cm. Echogenicity and renal cortical thickness are within normal limits. No mass, perinephric fluid, or hydronephrosis visualized. No sonographically demonstrable calculus or ureterectasis. Bladder: Appears normal for degree of bladder distention. A small amount of ascites is noted. Incidental note is made of splenic prominence. Spleen measures 12.3 x 7.5 x 12.1 cm with a measured splenic volume of 587 cubic cm. IMPRESSION: Normal appearing kidneys bilaterally. Splenic prominence as noted. Slight ascites. Electronically Signed   By: Lowella Grip III M.D.   On: 05/14/2018 14:16   Dg Chest Port 1 View  Result Date: 05/14/2018 CLINICAL DATA:  Shortness of breath. EXAM: PORTABLE CHEST 1 VIEW COMPARISON:  05/13/2018 FINDINGS: The heart size and mediastinal contours are within normal limits. Both lungs are clear. The visualized skeletal structures are unremarkable. IMPRESSION: No active disease. Electronically Signed   By: Kerby Moors M.D.   On: 05/14/2018 10:00   IMPRESSION:  *NASH cirrhosis:  Recent diagnosis and decompensation since knee replacement.  Recent admission with large right pleural effusion and ascites.  Was on lasix 80 mg daily and spironolactone 200 mg daily at home (spironolactone decreased to 100 mg daily just prior to admission).  Now admitted with confusion, AKI, and  dehydration.  Ammonia level was 163.  They had her on high doses of lactulose and she had a lot of diarrhea overnight.  Diuretics have been held.  PLAN: *Will decrease lactulose to 30 gms BID instead of TID.  This can be titrated with goal of 3-4 soft BM's daily.   *Will add xifaxan 550 mg BID as well. *Will need to resume diuretics at lower doses upon discharge. *Is on Viberzi for IBS-D as outpatient and this should be discontinue  upon D/C. *Will check PT/INR.   Laban Emperor. Zehr  05/15/2018, 8:47 AM   Attending physician's note   I have taken an interval history, reviewed the chart and examined the patient. I agree with the Advanced Practitioner's note, impression and recommendations.   62 year old with a decompensated NASH liver cirrhosis (Child's class B) with portal hypertension. History of ascites and right hydrothorax status post thoracocentesis and LVP earlier this month.  Now admitted with acute hepatic encephalopathy, AKI likely precipitated by dehydration. No obvious infections or other ppt factors. MELD score 16. Plan: Gently hydrate (watch for overhydration), lactulose (titrate to 3-4 bowel movements per day) and rifaximin 550 mg p.o. twice daily.  She will need diuretics at a lower dose upon discharge. Will follow along. Discussed with pt's husband in detail.  Carmell Austria, MD

## 2018-05-15 NOTE — Progress Notes (Signed)
Nutrition Brief Note  Patient identified on the Malnutrition Screening Tool (MST) Report  Pt with weight loss but suspect this is d/t fluid loss from taking Lasix at home and multiple procedures over the past month (thoracentesis, paracentesis). During previous admission pt was consuming 50-95% of meals (5/7-5/14). Pt was provided low sodium diet education as well by dietitian staff on 5/7. Pt has been ordered ensure supplements, will monitor for any needs based on PO intakes.  Wt Readings from Last 15 Encounters:  05/15/18 171 lb 4.8 oz (77.7 kg)  05/10/18 181 lb (82.1 kg)  05/06/18 186 lb (84.4 kg)  05/03/18 194 lb 0.1 oz (88 kg)  03/24/18 214 lb (97.1 kg)  03/18/18 214 lb 4 oz (97.2 kg)  02/21/18 212 lb 6.4 oz (96.3 kg)  01/20/18 224 lb (101.6 kg)  01/06/18 217 lb (98.4 kg)  12/10/17 224 lb (101.6 kg)  06/15/17 219 lb 12.8 oz (99.7 kg)  12/04/16 220 lb (99.8 kg)  11/18/16 219 lb (99.3 kg)  11/12/15 205 lb (93 kg)  01/01/14 204 lb (92.5 kg)    Body mass index is 29.4 kg/m. Patient meets criteria for overweight based on current BMI.   Current diet order is [id number]. Labs and medications reviewed.   No nutrition interventions warranted at this time. If nutrition issues arise, please consult RD.   Clayton Bibles, MS, RD, [place] Dietitian Pager: [phone] After Hours Pager: [phone]

## 2018-05-16 ENCOUNTER — Inpatient Hospital Stay (HOSPITAL_COMMUNITY): Payer: 59

## 2018-05-16 LAB — CBC
HCT: 36.5 % (ref 36.0–46.0)
Hemoglobin: 11.8 g/dL — ABNORMAL LOW (ref 12.0–15.0)
MCH: 28.6 pg (ref 26.0–34.0)
MCHC: 32.3 g/dL (ref 30.0–36.0)
MCV: 88.4 fL (ref 78.0–100.0)
PLATELETS: 150 10*3/uL (ref 150–400)
RBC: 4.13 MIL/uL (ref 3.87–5.11)
RDW: 14.9 % (ref 11.5–15.5)
WBC: 8.2 10*3/uL (ref 4.0–10.5)

## 2018-05-16 LAB — COMPREHENSIVE METABOLIC PANEL
ALT: 31 U/L (ref 14–54)
AST: 56 U/L — AB (ref 15–41)
Albumin: 2.9 g/dL — ABNORMAL LOW (ref 3.5–5.0)
Alkaline Phosphatase: 80 U/L (ref 38–126)
Anion gap: 8 (ref 5–15)
BUN: 29 mg/dL — ABNORMAL HIGH (ref 6–20)
CHLORIDE: 105 mmol/L (ref 101–111)
CO2: 20 mmol/L — AB (ref 22–32)
CREATININE: 1.37 mg/dL — AB (ref 0.44–1.00)
Calcium: 8.8 mg/dL — ABNORMAL LOW (ref 8.9–10.3)
GFR calc non Af Amer: 41 mL/min — ABNORMAL LOW (ref >60)
GFR, EST AFRICAN AMERICAN: 47 mL/min — AB (ref >60)
Glucose, Bld: 99 mg/dL (ref 65–99)
POTASSIUM: 3.9 mmol/L (ref 3.5–5.1)
SODIUM: 133 mmol/L — AB (ref 135–145)
Total Bilirubin: 2 mg/dL — ABNORMAL HIGH (ref 0.3–1.2)
Total Protein: 6.7 g/dL (ref 6.5–8.1)

## 2018-05-16 LAB — AMMONIA: Ammonia: 63 umol/L — ABNORMAL HIGH (ref 9–35)

## 2018-05-16 MED ORDER — PANTOPRAZOLE SODIUM 40 MG PO TBEC
40.0000 mg | DELAYED_RELEASE_TABLET | Freq: Two times a day (BID) | ORAL | Status: DC
  Administered 2018-05-16 – 2018-05-18 (×4): 40 mg via ORAL
  Filled 2018-05-16 (×4): qty 1

## 2018-05-16 MED ORDER — SUCRALFATE 1 GM/10ML PO SUSP
1.0000 g | Freq: Three times a day (TID) | ORAL | Status: DC
  Administered 2018-05-16 – 2018-05-18 (×7): 1 g via ORAL
  Filled 2018-05-16 (×7): qty 10

## 2018-05-16 MED ORDER — LACTULOSE 10 GM/15ML PO SOLN
30.0000 g | Freq: Three times a day (TID) | ORAL | Status: DC
Start: 2018-05-16 — End: 2018-05-18
  Administered 2018-05-16 – 2018-05-18 (×3): 30 g via ORAL
  Filled 2018-05-16 (×3): qty 60

## 2018-05-16 NOTE — Progress Notes (Addendum)
[facility] Progress Note  CC:  Cirrhosis, HE  Subjective:  Doing much better today.  Looks much better.  Having very little stool now.  Complaining of right back/flank pain and intermittent epigastric pain.  Objective:  Vital signs in last 24 hours: Temp:  [97.8 F (36.6 C)-99 F (37.2 C)] 99 F (37.2 C) (05/27 0538) Pulse Rate:  [94-109] 94 (05/27 0538) Resp:  [16-18] 16 (05/27 0538) BP: (142-150)/(78-82) 146/82 (05/27 0538) SpO2:  [98 %-100 %] 100 % (05/27 0538) Weight:  [173 lb 4.5 oz (78.6 kg)] 173 lb 4.5 oz (78.6 kg) (05/27 1937) Last BM Date: 05/15/18 General:  Alert, Well-developed, in NAD Heart:  Regular rate and rhythm; no murmurs Pulm:  CTAB.  No increased WOB. Abdomen:  Soft, non-distended.  BS present.  Mild right sided TTP. Extremities:  1+ pitting edema in B/L LE's. Neurologic:  Alert and oriented x 4;  grossly normal neurologically. Psych:  Alert and cooperative. Normal mood and affect.  Intake/Output from previous day: 05/26 0701 - 05/27 0700 In: 1880 [P.O.:600; I.V.:1280] Out: 2 [Urine:1; Stool:1]  Lab Results: Recent Labs    05/14/18 0425 05/15/18 0514 05/16/18 0453  WBC 7.4 8.4 8.2  HGB 12.8 13.1 11.8*  HCT 39.0 40.2 36.5  PLT 148* 155 150   BMET Recent Labs    05/14/18 0425 05/15/18 0514 05/16/18 0453  NA 136 137 133*  K 4.1 4.4 3.9  CL 104 106 105  CO2 20* 19* 20*  GLUCOSE 124* 121* 99  BUN 32* 31* 29*  CREATININE 2.40* 1.69* 1.37*  CALCIUM 9.5 9.9 8.8*   LFT Recent Labs    05/16/18 0453  PROT 6.7  ALBUMIN 2.9*  AST 56*  ALT 31  ALKPHOS 80  BILITOT 2.0*   PT/INR Recent Labs    05/15/18 1157  LABPROT 15.9*  INR 1.28   US Renal  Result Date: 05/14/2018 CLINICAL DATA:  Right flank pain.  Hypertension. EXAM: RENAL / URINARY TRACT ULTRASOUND COMPLETE COMPARISON:  CT abdomen and pelvis Apr 23, 2018 FINDINGS: Right Kidney: Length: 10.6 cm. Echogenicity and renal cortical thickness are within normal limits.  No mass, perinephric fluid, or hydronephrosis visualized. No sonographically demonstrable calculus or ureterectasis. Left Kidney: Length: 11.2 cm. Echogenicity and renal cortical thickness are within normal limits. No mass, perinephric fluid, or hydronephrosis visualized. No sonographically demonstrable calculus or ureterectasis. Bladder: Appears normal for degree of bladder distention. A small amount of ascites is noted. Incidental note is made of splenic prominence. Spleen measures 12.3 x 7.5 x 12.1 cm with a measured splenic volume of 587 cubic cm. IMPRESSION: Normal appearing kidneys bilaterally. Splenic prominence as noted. Slight ascites. Electronically Signed   By: Lowella Grip III M.D.   On: 05/14/2018 14:16   Assessment / Plan: *NASH cirrhosis (Child's B):  Recent diagnosis and decompensation since knee replacement.  Recent admission with large right pleural effusion/hepatic hydrothorax and ascites.  Was on lasix 80 mg daily and spironolactone 200 mg daily at home (spironolactone decreased to 100 mg daily just prior to admission).  Now admitted with confusion, AKI, and dehydration.  Ammonia level was 163.  They had her on high doses of lactulose and she had a lot of diarrhea.  Diuretics have been held.  Lactulose decreased and ammonia is down to 63.  Cr continues to improve.  MELD 18 today.  **Will increase lactulose back to 30 gms TID.  This can be titrated with goal of 3-4 soft BM's daily.   **  Xifaxan 550 mg BID added 5/26 as well. **Will need to resume diuretics at lower doses upon discharge (? Lasix 40 mg daily and spironolactone 100 mg daily). **Is on Viberzi for IBS-D as outpatient and this likely will need to be discontinued upon D/C. **K-pad ordered for flank and abdominal pain.   **PT ordered at family request.    LOS: 3 days   Dawn Moon. Dawn Moon  05/16/2018, 8:56 AM   Attending physician's note   I have taken an interval history, reviewed the chart and examined the patient. I  agree with the Advanced Practitioner's note, impression and recommendations.   62 year old with a decompensated NASH liver cirrhosis (Child's class B) with portal HTN, H/O ascites and right hydrothorax status post thoracocentesis and LVP earlier this month.  Now with acute hepatic encephalopathy likely precipitated by dehydration. No obvious infections or other ppt factors. MELD score 18. Doing great today.  Plan:  Ambulate, PT consult, continue lactulose (titrate to 3-4 bowel movements per day) and rifaximin 550 mg p.o. twice daily.  She will need diuretics at a lower dose upon discharge. Trend weight. FU with Dawn Moon as outpatient (Pt of Dr Carlean Purl). If continues to do well, expect discharge tomorrow.  As an outpatient-will need EGD for esophageal varices screening and likely referral to [facility] for consideration for liver transplant (requested by family).   Dawn Austria, MD

## 2018-05-16 NOTE — Progress Notes (Signed)
[facility] Progress Note  Patient: Dawn Moon KVQ:[id number]   PCP: Leamon Arnt, MD DOB: Jul 27, 1956   DOA: 05/13/2018   DOS: 05/16/2018   Date of Service: the patient was seen and examined on 05/16/2018  Subjective: Headache is much better, abdominal pain after eating.  No nausea no vomiting.  No bowel movement today.  Brief hospital course: Pt. with PMH of IBS, HFpEF, paroxysmal SVT, hypertension, andNASH; admitted on 05/13/2018, presented with complaint of nausea and weakness and confusion, was found to have hepatic encephalopathy with ammonia level of 167, constipation, acute kidney injury due to dehydration Currently further plan is continue current management.  Assessment and Plan: 1.  Acute hepatic encephalopathy. Cirrhosis of the liver due to [place]. Bowel impaction Ammonia level 163 on admission. Currently 3. Patient was constipated requiring enema yesterday. After manual disimpaction, patient had multiple rounds of diarrhea yesterday. Continue lactulose. GI consulted appreciate input. Monitor.  Rifaximin has been added to the patient's regimen as well. Holding Lasix and Aldactone.  INR.  2.  Acute kidney injury. Secondary to dehydration. Patient was on high dose of Lasix 200 mg Aldactone as well. Dose has been recently reduced. Presents with serum creatinine of 2.62 and BUN of 32. Currently it is trending down with holding Lasix. We will continue with hydration for now due to multiple episodes of diarrhea,  Although will reduce the rate. Recheck renal function and volume status tomorrow and back off on the fluids. Renal ultrasound unremarkable for any acute abnormality.  3.  Essential hypertension. Blood pressure stable. Continue atenolol.  4.  Chronic diastolic CHF. On Lasix and Aldactone. Currently holding them. Receiving IV hydration, monitor for volume overload.  5.  Frontal headache. From dehydration. Continue IV hydration for now.  6.   GERD. Continuing PPI.  7.  Recent right total knee replacement. Started on aspirin twice daily postoperatively. Discussed with orthopedic, recommend that the patient does not need aspirin beyond 1 month. Patient is well beyond 1 month for his surgery and therefore will discontinue aspirin to minimize risk for bleeding in patient with cirrhosis.  8.  Abdominal pain after eating. Try Protonix and Carafate.  Diet: Low-salt diet DVT Prophylaxis: subcutaneous Heparin  Advance goals of care discussion: Full code  Family Communication: family was present at bedside, at the time of interview. The pt provided permission to discuss medical plan with the family. Opportunity was given to ask question and all questions were answered satisfactorily.   Disposition:  Discharge to home.  Consultants: gastroenterology  Procedures: none  Antibiotics: Anti-infectives (From admission, onward)   Start     Dose/Rate Route Frequency Ordered Stop   05/15/18 1100  rifaximin (XIFAXAN) tablet 550 mg     550 mg Oral 2 times daily 05/15/18 0959         Objective: Physical Exam: Vitals:   05/15/18 2131 05/16/18 0538 05/16/18 0638 05/16/18 1436  BP: (!) 150/81 (!) 146/82  (!) 149/67  Pulse: 99 94  (!) 103  Resp:  16  18  Temp: 98.6 F (37 C) 99 F (37.2 C)  98.4 F (36.9 C)  TempSrc: Oral Oral  Oral  SpO2: 99% 100%  98%  Weight:   78.6 kg (173 lb 4.5 oz)   Height:        Intake/Output Summary (Last 24 hours) at 05/16/2018 1636 Last data filed at 05/16/2018 6433 Gross per 24 hour  Intake 1500 ml  Output 2 ml  Net 1498 ml   [employer]  05/14/18 0534 05/15/18 0620 05/16/18 4174  Weight: 81.8 kg (180 lb 5.4 oz) 77.7 kg (171 lb 4.8 oz) 78.6 kg (173 lb 4.5 oz)   General: Alert, Awake and Oriented to Time, Place and Person. Appear in mild distress, affect appropriate Eyes: PERRL, Conjunctiva normal ENT: Oral Mucosa clear moist. Neck: difficult to assess JVD, no Abnormal Mass Or  lumps Cardiovascular: S1 and S2 Present, no Murmur, Peripheral Pulses Present Respiratory: normal respiratory effort, Bilateral Air entry equal and Decreased, no use of accessory muscle, Clear to Auscultation, no Crackles, no wheezes Abdomen: Bowel Sound present, Soft and no tenderness, no hernia Skin: no redness, no Rash, no induration Extremities: no Pedal edema, no calf tenderness Neurologic: Grossly no focal neuro deficit. Bilaterally Equal motor strength   Data Reviewed: CBC: Recent Labs  Lab 05/13/18 1958 05/14/18 0425 05/15/18 0514 05/16/18 0453  WBC 9.0 7.4 8.4 8.2  NEUTROABS 7.0  --   --   --   HGB 14.4 12.8 13.1 11.8*  HCT 40.3 39.0 40.2 36.5  MCV 83.8 88.2 88.5 88.4  PLT 174 148* 155 081   Basic Metabolic Panel: Recent Labs  Lab 05/13/18 1004 05/13/18 1958 05/14/18 0425 05/15/18 0514 05/16/18 0453  NA 135 133* 136 137 133*  K 4.7 4.3 4.1 4.4 3.9  CL 95* 100* 104 106 105  CO2 19* 18* 20* 19* 20*  GLUCOSE 117* 132* 124* 121* 99  BUN 28* 32* 32* 31* 29*  CREATININE 2.50* 2.65* 2.40* 1.69* 1.37*  CALCIUM 10.7* 9.9 9.5 9.9 8.8*  MG 2.3  --   --  2.1  --     Liver Function Tests: Recent Labs  Lab 05/13/18 1958 05/14/18 0425 05/15/18 0514 05/16/18 0453  AST 54* 48* 66* 56*  ALT 27 25 33 31  ALKPHOS 101 81 94 80  BILITOT 2.1* 1.8* 2.4* 2.0*  PROT 8.0 7.0 7.6 6.7  ALBUMIN 3.5 3.2* 3.4* 2.9*   No results for input(s): LIPASE, AMYLASE in the last 168 hours. Recent Labs  Lab 05/13/18 1958 05/14/18 0425 05/15/18 0514 05/16/18 0453  AMMONIA 163* 137* 67* 63*   Coagulation Profile: Recent Labs  Lab 05/15/18 1157  INR 1.28   Cardiac Enzymes: Recent Labs  Lab 05/13/18 1946  TROPONINI <0.03   BNP (last 3 results) No results for input(s): PROBNP in the last 8760 hours. CBG: No results for input(s): GLUCAP in the last 168 hours. Studies: Dg Abd Portable 1v  Result Date: 05/16/2018 CLINICAL DATA:  Right flank pain. Epigastric pain. Back pain.  Abdominal pain. EXAM: PORTABLE ABDOMEN - 1 VIEW COMPARISON:  04/23/2018 FINDINGS: There are no disproportionally dilated loops of bowel. There is no obvious free intraperitoneal gas. Small calcification projects over the left renal shadow suggesting nephrolithiasis. IMPRESSION: Nonobstructive bowel gas pattern. Electronically Signed   By: Marybelle Killings M.D.   On: 05/16/2018 15:24    Scheduled Meds: . feeding supplement (ENSURE ENLIVE)  237 mL Oral BID BM  . heparin  5,000 Units Subcutaneous Q8H  . lactulose  30 g Oral TID  . pantoprazole  40 mg Oral BID AC  . rifaximin  550 mg Oral BID  . sucralfate  1 g Oral TID WC & HS   Continuous Infusions: . sodium chloride 50 mL/hr at 05/16/18 1441   PRN Meds: bisacodyl, HYDROcodone-acetaminophen, ipratropium-albuterol, promethazine  Time spent: 35 minutes  Author: Berle Mull, MD [facility] Pager: [phone] 05/16/2018 4:36 PM  If 7PM-7AM, please contact night-coverage at [email or web address], password [facility]

## 2018-05-17 ENCOUNTER — Encounter: Payer: Self-pay | Admitting: Family Medicine

## 2018-05-17 LAB — COMPREHENSIVE METABOLIC PANEL
ALBUMIN: 2.6 g/dL — AB (ref 3.5–5.0)
ALT: 37 U/L (ref 14–54)
ANION GAP: 9 (ref 5–15)
AST: 72 U/L — ABNORMAL HIGH (ref 15–41)
Alkaline Phosphatase: 81 U/L (ref 38–126)
BUN: 22 mg/dL — ABNORMAL HIGH (ref 6–20)
CHLORIDE: 108 mmol/L (ref 101–111)
CO2: 18 mmol/L — AB (ref 22–32)
Calcium: 8.8 mg/dL — ABNORMAL LOW (ref 8.9–10.3)
Creatinine, Ser: 0.99 mg/dL (ref 0.44–1.00)
GFR calc non Af Amer: >60 mL/min (ref >60)
Glucose, Bld: 95 mg/dL (ref 65–99)
POTASSIUM: 4.6 mmol/L (ref 3.5–5.1)
Sodium: 135 mmol/L (ref 135–145)
Total Bilirubin: 1.8 mg/dL — ABNORMAL HIGH (ref 0.3–1.2)
Total Protein: 5.9 g/dL — ABNORMAL LOW (ref 6.5–8.1)

## 2018-05-17 LAB — CBC
HCT: 36.2 % (ref 36.0–46.0)
Hemoglobin: 11.6 g/dL — ABNORMAL LOW (ref 12.0–15.0)
MCH: 28.9 pg (ref 26.0–34.0)
MCHC: 32 g/dL (ref 30.0–36.0)
MCV: 90.3 fL (ref 78.0–100.0)
PLATELETS: 110 10*3/uL — AB (ref 150–400)
RBC: 4.01 MIL/uL (ref 3.87–5.11)
RDW: 14.7 % (ref 11.5–15.5)
WBC: 6.7 10*3/uL (ref 4.0–10.5)

## 2018-05-17 LAB — AMMONIA: Ammonia: 75 umol/L — ABNORMAL HIGH (ref 9–35)

## 2018-05-17 MED ORDER — PROMETHAZINE HCL 25 MG/ML IJ SOLN
25.0000 mg | Freq: Four times a day (QID) | INTRAMUSCULAR | Status: DC | PRN
  Administered 2018-05-18: 25 mg via INTRAVENOUS
  Filled 2018-05-17 (×2): qty 1

## 2018-05-17 MED ORDER — METOPROLOL TARTRATE 5 MG/5ML IV SOLN
5.0000 mg | Freq: Once | INTRAVENOUS | Status: AC
  Administered 2018-05-17: 5 mg via INTRAVENOUS
  Filled 2018-05-17: qty 5

## 2018-05-17 MED ORDER — NADOLOL 20 MG PO TABS
20.0000 mg | ORAL_TABLET | Freq: Every day | ORAL | Status: DC
  Administered 2018-05-17 – 2018-05-19 (×3): 20 mg via ORAL
  Filled 2018-05-17 (×3): qty 1

## 2018-05-17 NOTE — Progress Notes (Addendum)
Patient ID: Dawn Moon, female   DOB: 01-22-1956, 62 y.o.   MRN: [id number]     Progress Note   Subjective    Mentation significantly better than on admit , but not back to baseline per [pt and family Remains weak, tired, nauseated - says fills up quickly - ate best this that she has since admit Multiple liquid stools yesterday after fecal impaction  Removed  Ammonia -75  today   Objective   Vital signs in last 24 hours: Temp:  [98.4 F (36.9 C)-98.8 F (37.1 C)] 98.7 F (37.1 C) (05/28 0502) Pulse Rate:  [96-107] 96 (05/28 0502) Resp:  [18] 18 (05/28 0502) BP: (131-149)/(67-87) 131/72 (05/28 0502) SpO2:  [95 %-98 %] 98 % (05/28 0502) Weight:  [185 lb (83.9 kg)] 185 lb (83.9 kg) (05/28 0500) Last BM Date: 05/16/18 General:  Older   white female in NAD Heart:   Tachy regular rate and rhythm; no murmurs Lungs: Respirations even and unlabored, lungs CTA bilaterally Abdomen:  Soft, nontender and + ascites . Normal bowel sounds. Extremities:  Without edema. Neurologic:  Alert and oriented,  grossly normal neurologically, no asterixis Psych:  Cooperative. Normal mood and affect.  Intake/Output from previous day: 05/27 0701 - 05/28 0700 In: 1776.3 [P.O.:360; I.V.:1416.3] Out: 10 [Urine:5; Stool:5] Intake/Output this shift: No intake/output data recorded.  Lab Results: Recent Labs    05/15/18 0514 05/16/18 0453 05/17/18 0458  WBC 8.4 8.2 6.7  HGB 13.1 11.8* 11.6*  HCT 40.2 36.5 36.2  PLT 155 150 110*   BMET Recent Labs    05/15/18 0514 05/16/18 0453 05/17/18 0458  NA 137 133* 135  K 4.4 3.9 4.6  CL 106 105 108  CO2 19* 20* 18*  GLUCOSE 121* 99 95  BUN 31* 29* 22*  CREATININE 1.69* 1.37* 0.99  CALCIUM 9.9 8.8* 8.8*   LFT Recent Labs    05/17/18 0458  PROT 5.9*  ALBUMIN 2.6*  AST 72*  ALT 37  ALKPHOS 81  BILITOT 1.8*   PT/INR Recent Labs    05/15/18 1157  LABPROT 15.9*  INR 1.28    Studies/Results: Dg Abd Portable 1v  Result Date:  05/16/2018 CLINICAL DATA:  Right flank pain. Epigastric pain. Back pain. Abdominal pain. EXAM: PORTABLE ABDOMEN - 1 VIEW COMPARISON:  04/23/2018 FINDINGS: There are no disproportionally dilated loops of bowel. There is no obvious free intraperitoneal gas. Small calcification projects over the left renal shadow suggesting nephrolithiasis. IMPRESSION: Nonobstructive bowel gas pattern. Electronically Signed   By: Marybelle Killings M.D.   On: 05/16/2018 15:24       Assessment / Plan:     #1 62 yo female with NASH cirrhosis admitted 5/24 with progressive decline in status , and lethargy, some confusion since last admit earlier this month with  Hepatic hydrothorax. Overall decline since knee replacement 03/2018  Hepatic encephalopathy is improving - continue Xifaxan 550 mg po BID and lactulose 30 cc TID   #2 AKI - improved - plan to resume  diuretics at lower dose -lasix 40 QD and Aldactone 100 mg daily #3 hx PSVT -had been on atenolol - switching to Nadolol today  #4 hx IBS -D - impacted this admit Vonzell Schlatter - D/C [place]- contraindicated with Cirrhosis #5 Ascites -  Will monitor - may need paracentesis this week  #6 Nausea- ? med related , try to wean off hydrocodone , prn phenergan #7 variceal screening - no EGD - will plan as outpt with Dr Carlean Purl #8 [facility]  surveillance- check AFP,  Recent US 04/2018    Contact  Amy Esterwood, P.A.-C               [phone]   Principal Problem:   Acute hepatic encephalopathy Active Problems:   Essential hypertension   GERD (gastroesophageal reflux disease)   Liver cirrhosis secondary to NASH (nonalcoholic steatohepatitis) (HCC)   History of paroxysmal supraventricular tachycardia   Heart failure ([place])   Acute renal failure (ARF) ([place])    LOS: 4 days   Amy Esterwood  05/17/2018, 8:42 AM   GI ATTENDING  Case discussed with Dr. Lyndel Safe. Interval progress note reviewed. Case discussed with GI physician assistant. Patient not quite ready for discharge as  previously anticipated. Agree with outlined measures above.  Docia Chuck. Geri Seminole., M.D. [facility] Division of Gastroenterology

## 2018-05-17 NOTE — Evaluation (Signed)
Physical Therapy Evaluation Patient Details Name: Dawn Moon MRN: [id number] DOB: 1956-06-08 Today's Date: 05/17/2018   History of Present Illness   62 y.o. female with medical history significant of IBS, HFpEF, paroxysmal SVT, hypertension, and NASH;  who presents with complaints of nausea, weakness, and confusion over the last 1 week. Patient had recently been hospitalized to undergo right knee replacement from 4/4-4/5. Subsequently, patient was readmitted into the hospital from 5/4-5/14 for acute hypoxic respiratory failure 2/2 right pleural effusion related to hepatic hydrothorax and ascites. Pt admitted 05/13/18 with hepatic encephalopathy, dehydration, AKI.   Clinical Impression  Pt admitted with above diagnosis. Pt currently with functional limitations due to the deficits listed below (see PT Problem List). Pt ambulated 110' holding an IV pole, HR 127 max, 2/4 dyspnea, distance limited by fatigue.  Pt will benefit from skilled PT to increase their independence and safety with mobility to allow discharge to the venue listed below.       Follow Up Recommendations Home health PT(pt reports she lacks endurance needed to get to outpt PT) to resume TKA rehab    Equipment Recommendations  None recommended by PT    Recommendations for Other Services       Precautions / Restrictions Precautions Precautions: Fall;Knee Precaution Comments: reviewed no pillow under knee Restrictions Weight Bearing Restrictions: No      Mobility  Bed Mobility Overal bed mobility: Independent                Transfers Overall transfer level: Needs assistance Equipment used: None Transfers: Sit to/from Stand Sit to Stand: Supervision         General transfer comment: for safety  Ambulation/Gait Ambulation/Gait assistance: Supervision Ambulation Distance (Feet): 110 Feet Assistive device: (IV pole) Gait Pattern/deviations: Step-through pattern;Decreased step length - right;Decreased  step length - left     General Gait Details: HR 127 walking, 2/4 dyspnea, no loss of balance, distance limited by fatigue  Stairs            Wheelchair Mobility    Modified Rankin (Stroke Patients Only)       Balance                                             Pertinent Vitals/Pain Pain Assessment: 0-10 Pain Score: 2  Pain Location: right knee Pain Descriptors / Indicators: Dull Pain Intervention(s): Monitored during session;Limited activity within patient's tolerance    Home Living Family/patient expects to be discharged to:: Private residence Living Arrangements: Spouse/significant other Available Help at Discharge: Family Type of Home: House Home Access: Stairs to enter Entrance Stairs-Rails: Psychiatric nurse of Steps: 3 Home Layout: One level Home Equipment: Environmental consultant - 2 wheels;Cane - single point Additional Comments: had been going to OPPT 3x wk until 5/4 hospital admission    Prior Function Level of Independence: Independent with assistive device(s)         Comments: used cane prn     Hand Dominance        Extremity/Trunk Assessment   Upper Extremity Assessment Upper Extremity Assessment: Overall WFL for tasks assessed    Lower Extremity Assessment RLE Deficits / Details: SLR independent, 10-100* AAROM    Cervical / Trunk Assessment Cervical / Trunk Assessment: Normal  Communication   Communication: No difficulties  Cognition Arousal/Alertness: Awake/alert Behavior During Therapy: WFL for tasks assessed/performed Overall Cognitive Status: Within  Functional Limits for tasks assessed                                        General Comments      Exercises Total Joint Exercises Quad Sets: AROM;Both;10 reps;Supine Straight Leg Raises: AROM;Right;10 reps;Supine Goniometric ROM: 5-110* AAROM R   Assessment/Plan    PT Assessment Patient needs continued PT services  PT Problem List  Decreased range of motion;Decreased mobility;Decreased activity tolerance;Cardiopulmonary status limiting activity       PT Treatment Interventions      PT Goals (Current goals can be found in the Care Plan section)  Acute Rehab PT Goals Patient Stated Goal: go home PT Goal Formulation: With patient/family Time For Goal Achievement: 05/31/18 Potential to Achieve Goals: Good    Frequency Min 3X/week   Barriers to discharge        Co-evaluation               AM-PAC PT "6 Clicks" Daily Activity  Outcome Measure Difficulty turning over in bed (including adjusting bedclothes, sheets and blankets)?: None Difficulty moving from lying on back to sitting on the side of the bed? : None Difficulty sitting down on and standing up from a chair with arms (e.g., wheelchair, bedside commode, etc,.)?: A Little Help needed moving to and from a bed to chair (including a wheelchair)?: A Little Help needed walking in hospital room?: A Little Help needed climbing 3-5 steps with a railing? : A Lot 6 Click Score: 19    End of Session Equipment Utilized During Treatment: Gait belt Activity Tolerance: Patient limited by fatigue Patient left: with call bell/phone within reach;in chair;with family/visitor present;with nursing/sitter in room Nurse Communication: Mobility status PT Visit Diagnosis: Difficulty in walking, not elsewhere classified (R26.2)    Time: 8832-5498 PT Time Calculation (min) (ACUTE ONLY): 19 min   Charges:   PT Evaluation $PT Eval Low Complexity: 1 Low     PT G Codes:        {  Philomena Doheny 05/17/2018, 11:49 AM [phone]

## 2018-05-17 NOTE — Progress Notes (Signed)
[facility] Progress Note  Patient: Dawn Moon YCX:[id number]   PCP: Leamon Arnt, MD DOB: 22-Feb-1956   DOA: 05/13/2018   DOS: 05/17/2018   Date of Service: the patient was seen and examined on 05/17/2018  Subjective: Complains of frequent nausea, no BM until late last night.  No fever no chills.  Feels fatigued.  Brief hospital course: Pt. with PMH of IBS, HFpEF, paroxysmal SVT, hypertension, andNASH; admitted on 05/13/2018, presented with complaint of nausea and weakness and confusion, was found to have hepatic encephalopathy with ammonia level of 167, constipation, acute kidney injury due to dehydration Currently further plan is continue current management.  Assessment and Plan: 1.  Acute hepatic encephalopathy. Cirrhosis of the liver due to [place]. Bowel impaction Ammonia level 163 on admission. Currently 42. Patient was constipated requiring enema yesterday. After manual disimpaction, patient had multiple rounds of diarrhea yesterday. Continue lactulose. GI consulted appreciate input. Monitor.  Rifaximin has been added to the patient's regimen as well. Holding Lasix and Aldactone.  INR. Patient is [place] class B at a minimum, will add nadolol 20 mg daily.  2.  Acute kidney injury. Secondary to dehydration. Patient was on high dose of Lasix 200 mg Aldactone as well. Dose has been recently reduced. Presents with serum creatinine of 2.62 and BUN of 32. Currently it is trending down with holding Lasix. We will continue with hydration for now due to multiple episodes of diarrhea,  Although will reduce the rate. Recheck renal function and volume status tomorrow and back off on the fluids. Renal ultrasound unremarkable for any acute abnormality.  3.  Essential hypertension. Sinus tachycardia Chest pain Blood pressure stable. Discontinue atenolol.  Switch to nadolol EKG shows no evidence of acute abnormality.  Chest pain has resolved. No suspect cardiac chest  pain.  4.  Chronic diastolic CHF. On Lasix and Aldactone. Currently holding them. Receiving IV hydration, monitor for volume overload.  5.  Frontal headache. From dehydration. Continue IV hydration for now.  6.  GERD. Continuing PPI.  7.  Recent right total knee replacement. Started on aspirin twice daily postoperatively. Discussed with orthopedic, recommend that the patient does not need aspirin beyond 1 month. Patient is well beyond 1 month for his surgery and therefore will discontinue aspirin to minimize risk for bleeding in patient with cirrhosis.  8.  Abdominal pain after eating. Try Protonix and Carafate.  Diet: Low-salt diet DVT Prophylaxis: subcutaneous Heparin  Advance goals of care discussion: Full code  Family Communication: family was present at bedside, at the time of interview. The pt provided permission to discuss medical plan with the family. Opportunity was given to ask question and all questions were answered satisfactorily.   Disposition:  Discharge to home.  Consultants: gastroenterology  Procedures: none  Antibiotics: Anti-infectives (From admission, onward)   Start     Dose/Rate Route Frequency Ordered Stop   05/15/18 1100  rifaximin (XIFAXAN) tablet 550 mg     550 mg Oral 2 times daily 05/15/18 0959         Objective: Physical Exam: Vitals:   05/17/18 0502 05/17/18 1140 05/17/18 1334 05/17/18 1519  BP: 131/72  135/70   Pulse: 96 (!) 127 (!) 123   Resp: 18  18   Temp: 98.7 F (37.1 C)  98.8 F (37.1 C)   TempSrc: Oral  Oral   SpO2: 98%  97% 94%  Weight:      Height:        Intake/Output Summary (Last 24 hours)  at 05/17/2018 1720 Last data filed at 05/17/2018 1553 Gross per 24 hour  Intake 3553.75 ml  Output 10 ml  Net 3543.75 ml   Filed Weights   05/15/18 0620 05/16/18 0638 05/17/18 0500  Weight: 77.7 kg (171 lb 4.8 oz) 78.6 kg (173 lb 4.5 oz) 83.9 kg (185 lb)   General: Alert, Awake and Oriented to Time, Place and Person.  Appear in mild distress, affect appropriate Eyes: PERRL, Conjunctiva normal ENT: Oral Mucosa clear moist. Neck: difficult to assess JVD, no Abnormal Mass Or lumps Cardiovascular: S1 and S2 Present, no Murmur, Peripheral Pulses Present Respiratory: normal respiratory effort, Bilateral Air entry equal and Decreased, no use of accessory muscle, Clear to Auscultation, no Crackles, no wheezes Abdomen: Bowel Sound present, Soft and no tenderness, no hernia Skin: no redness, no Rash, no induration Extremities: no Pedal edema, no calf tenderness Neurologic: Grossly no focal neuro deficit. Bilaterally Equal motor strength   Data Reviewed: CBC: Recent Labs  Lab 05/13/18 1958 05/14/18 0425 05/15/18 0514 05/16/18 0453 05/17/18 0458  WBC 9.0 7.4 8.4 8.2 6.7  NEUTROABS 7.0  --   --   --   --   HGB 14.4 12.8 13.1 11.8* 11.6*  HCT 40.3 39.0 40.2 36.5 36.2  MCV 83.8 88.2 88.5 88.4 90.3  PLT 174 148* 155 150 650*   Basic Metabolic Panel: Recent Labs  Lab 05/13/18 1004 05/13/18 1958 05/14/18 0425 05/15/18 0514 05/16/18 0453 05/17/18 0458  NA 135 133* 136 137 133* 135  K 4.7 4.3 4.1 4.4 3.9 4.6  CL 95* 100* 104 106 105 108  CO2 19* 18* 20* 19* 20* 18*  GLUCOSE 117* 132* 124* 121* 99 95  BUN 28* 32* 32* 31* 29* 22*  CREATININE 2.50* 2.65* 2.40* 1.69* 1.37* 0.99  CALCIUM 10.7* 9.9 9.5 9.9 8.8* 8.8*  MG 2.3  --   --  2.1  --   --     Liver Function Tests: Recent Labs  Lab 05/13/18 1958 05/14/18 0425 05/15/18 0514 05/16/18 0453 05/17/18 0458  AST 54* 48* 66* 56* 72*  ALT 27 25 33 31 37  ALKPHOS 101 81 94 80 81  BILITOT 2.1* 1.8* 2.4* 2.0* 1.8*  PROT 8.0 7.0 7.6 6.7 5.9*  ALBUMIN 3.5 3.2* 3.4* 2.9* 2.6*   No results for input(s): LIPASE, AMYLASE in the last 168 hours. Recent Labs  Lab 05/13/18 1958 05/14/18 0425 05/15/18 0514 05/16/18 0453 05/17/18 0458  AMMONIA 163* 137* 67* 63* 75*   Coagulation Profile: Recent Labs  Lab 05/15/18 1157  INR 1.28   Cardiac  Enzymes: Recent Labs  Lab 05/13/18 1946  TROPONINI <0.03   BNP (last 3 results) No results for input(s): PROBNP in the last 8760 hours. CBG: No results for input(s): GLUCAP in the last 168 hours. Studies: No results found.  Scheduled Meds: . feeding supplement (ENSURE ENLIVE)  237 mL Oral BID BM  . heparin  5,000 Units Subcutaneous Q8H  . lactulose  30 g Oral TID  . nadolol  20 mg Oral Daily  . pantoprazole  40 mg Oral BID AC  . rifaximin  550 mg Oral BID  . sucralfate  1 g Oral TID WC & HS   Continuous Infusions: . sodium chloride 50 mL/hr at 05/17/18 1545   PRN Meds: bisacodyl, HYDROcodone-acetaminophen, ipratropium-albuterol, promethazine  Time spent: 35 minutes  Author: Berle Mull, MD [facility] Pager: [phone] 05/17/2018 5:20 PM  If 7PM-7AM, please contact night-coverage at [email or web address], password [facility]

## 2018-05-17 NOTE — Progress Notes (Signed)
Pt complaining of chest pain and the feeling that her heart is racing. Current heart rate in the 120's. MD notified. 12 lead EKG ordered and 5 mg metoprolol given. Pt feeling much better, HR currently 98. Pt stable at this time.

## 2018-05-18 ENCOUNTER — Ambulatory Visit: Payer: 59 | Admitting: Gastroenterology

## 2018-05-18 ENCOUNTER — Encounter

## 2018-05-18 DIAGNOSIS — N179 Acute kidney failure, unspecified: Secondary | ICD-10-CM

## 2018-05-18 DIAGNOSIS — R109 Unspecified abdominal pain: Secondary | ICD-10-CM

## 2018-05-18 DIAGNOSIS — R188 Other ascites: Secondary | ICD-10-CM

## 2018-05-18 DIAGNOSIS — I5032 Chronic diastolic (congestive) heart failure: Secondary | ICD-10-CM

## 2018-05-18 LAB — COMPREHENSIVE METABOLIC PANEL
ALBUMIN: 2.6 g/dL — AB (ref 3.5–5.0)
ALK PHOS: 83 U/L (ref 38–126)
ALT: 35 U/L (ref 14–54)
ANION GAP: 5 (ref 5–15)
AST: 53 U/L — ABNORMAL HIGH (ref 15–41)
BUN: 20 mg/dL (ref 6–20)
CO2: 24 mmol/L (ref 22–32)
Calcium: 9 mg/dL (ref 8.9–10.3)
Chloride: 106 mmol/L (ref 101–111)
Creatinine, Ser: 1.09 mg/dL — ABNORMAL HIGH (ref 0.44–1.00)
GFR calc non Af Amer: 54 mL/min — ABNORMAL LOW (ref >60)
GLUCOSE: 88 mg/dL (ref 65–99)
POTASSIUM: 4.6 mmol/L (ref 3.5–5.1)
SODIUM: 135 mmol/L (ref 135–145)
Total Bilirubin: 1.7 mg/dL — ABNORMAL HIGH (ref 0.3–1.2)
Total Protein: 6 g/dL — ABNORMAL LOW (ref 6.5–8.1)

## 2018-05-18 LAB — CBC
HCT: 37 % (ref 36.0–46.0)
Hemoglobin: 11.8 g/dL — ABNORMAL LOW (ref 12.0–15.0)
MCH: 28.8 pg (ref 26.0–34.0)
MCHC: 31.9 g/dL (ref 30.0–36.0)
MCV: 90.2 fL (ref 78.0–100.0)
PLATELETS: 127 10*3/uL — AB (ref 150–400)
RBC: 4.1 MIL/uL (ref 3.87–5.11)
RDW: 14.7 % (ref 11.5–15.5)
WBC: 6.2 10*3/uL (ref 4.0–10.5)

## 2018-05-18 LAB — MAGNESIUM: MAGNESIUM: 1.8 mg/dL (ref 1.7–2.4)

## 2018-05-18 LAB — AMMONIA: Ammonia: 50 umol/L — ABNORMAL HIGH (ref 9–35)

## 2018-05-18 MED ORDER — LACTULOSE 10 GM/15ML PO SOLN
30.0000 g | Freq: Two times a day (BID) | ORAL | Status: DC
  Filled 2018-05-18: qty 60

## 2018-05-18 MED ORDER — PANTOPRAZOLE SODIUM 40 MG PO TBEC
40.0000 mg | DELAYED_RELEASE_TABLET | Freq: Every day | ORAL | Status: DC
  Administered 2018-05-19: 40 mg via ORAL
  Filled 2018-05-18: qty 1

## 2018-05-18 MED ORDER — HYDROXYZINE HCL 25 MG PO TABS
25.0000 mg | ORAL_TABLET | Freq: Three times a day (TID) | ORAL | Status: DC | PRN
  Administered 2018-05-18: 25 mg via ORAL
  Filled 2018-05-18: qty 1

## 2018-05-18 MED ORDER — MAGNESIUM SULFATE 2 GM/50ML IV SOLN
2.0000 g | Freq: Once | INTRAVENOUS | Status: AC
  Administered 2018-05-18: 2 g via INTRAVENOUS
  Filled 2018-05-18: qty 50

## 2018-05-18 NOTE — Progress Notes (Signed)
Physical Therapy Treatment Patient Details Name: Dawn Moon MRN: [id number] DOB: November 11, 1956 Today's Date: 05/18/2018    History of Present Illness  62 y.o. female with medical history significant of IBS, HFpEF, paroxysmal SVT, hypertension, and NASH;  who presents with complaints of nausea, weakness, and confusion over the last 1 week. Patient had recently been hospitalized to undergo right knee replacement from 4/4-4/5. Subsequently, patient was readmitted into the hospital from 5/4-5/14 for acute hypoxic respiratory failure 2/2 right pleural effusion related to hepatic hydrothorax and ascites. Pt admitted 05/13/18 with hepatic encephalopathy, dehydration, AKI.     PT Comments    Exercise focused session; pt wished to defer OOB d/t nausea and having just had lactulose; encouraged pt to avoid placing pillow under knee, cautioned regarding importance of terminal knee extension;  Will continue to follow in acute setting  Follow Up Recommendations  Home health PT     Equipment Recommendations  None recommended by PT    Recommendations for Other Services       Precautions / Restrictions Precautions Precautions: Fall;Knee Precaution Comments: reviewed no pillow under knee--pt with pillow under knee and knee of bed elevated on arrival, knee of bed locked in extension Restrictions Weight Bearing Restrictions: No    Mobility  Bed Mobility               General bed mobility comments: mobility deferred per pt request d/t nausea and just had lactulose  Transfers                    Ambulation/Gait                 Stairs             Wheelchair Mobility    Modified Rankin (Stroke Patients Only)       Balance                                            Cognition Arousal/Alertness: Awake/alert Behavior During Therapy: WFL for tasks assessed/performed Overall Cognitive Status: Within Functional Limits for tasks assessed                                         Exercises Total Joint Exercises Quad Sets: AROM;Both;10 reps;Supine Short Arc Quad: AROM;Right;10 reps Heel Slides: 15 reps;Right;AROM Hip ABduction/ADduction: AROM;Strengthening;Right;10 reps Straight Leg Raises: AROM;Right;10 reps;Supine Goniometric ROM: grossly 7* to 12* flexion right knee    General Comments        Pertinent Vitals/Pain Pain Assessment: 0-10 Pain Score: 2  Pain Location: right knee Pain Descriptors / Indicators: Aching Pain Intervention(s): Monitored during session    Home Living                      Prior Function            PT Goals (current goals can now be found in the care plan section) Acute Rehab PT Goals Patient Stated Goal: go home PT Goal Formulation: With patient/family Time For Goal Achievement: 05/31/18 Potential to Achieve Goals: Good Progress towards PT goals: Progressing toward goals    Frequency    Min 3X/week      PT Plan Current plan remains appropriate    Co-evaluation  AM-PAC PT "6 Clicks" Daily Activity  Outcome Measure  Difficulty turning over in bed (including adjusting bedclothes, sheets and blankets)?: None Difficulty moving from lying on back to sitting on the side of the bed? : None Difficulty sitting down on and standing up from a chair with arms (e.g., wheelchair, bedside commode, etc,.)?: A Little Help needed moving to and from a bed to chair (including a wheelchair)?: A Little Help needed walking in hospital room?: A Little Help needed climbing 3-5 steps with a railing? : A Lot 6 Click Score: 19    End of Session   Activity Tolerance: Treatment limited secondary to medical complications (Comment)(nausea) Patient left: in bed;with call bell/phone within reach;with family/visitor present   PT Visit Diagnosis: Difficulty in walking, not elsewhere classified (R26.2)     Time: 4481-8563 PT Time Calculation (min) (ACUTE ONLY):  12 min  Charges:  $Therapeutic Exercise: 8-22 mins                    G CodesKenyon Ana, PT Pager: [phone] 05/18/2018    [facility] 05/18/2018, 12:17 PM

## 2018-05-18 NOTE — Progress Notes (Addendum)
Daily Rounding Note  05/18/2018, 10:08 AM  LOS: 5 days   SUBJECTIVE:   Chief complaint:     C/o nausea, hospitalist started Carafate.  ASA discontiued.  However not nauseated in last 24 hours and pt asking if carafate necessary.  Also wondering if she should start back on lasix, aldactone and IVF (running at 50/hour).   Soft, small, brown stool x 1 this AM.  No BM's later yesterday or overnight.   Some SOB yesterday when walking in hall, resolved.   OBJECTIVE:         Vital signs in last 24 hours:    Temp:  [97.9 F (36.6 C)-98.8 F (37.1 C)] 97.9 F (36.6 C) (05/29 0622) Pulse Rate:  [76-127] 78 (05/29 0622) Resp:  [16-18] 16 (05/29 0622) BP: (100-135)/(51-73) 100/51 (05/29 0622) SpO2:  [94 %-97 %] 96 % (05/29 0622) Weight:  [190 lb 8 oz (86.4 kg)] 190 lb 8 oz (86.4 kg) (05/29 0622) Last BM Date: 05/17/18 Filed Weights   05/16/18 0638 05/17/18 0500 05/18/18 0622  Weight: 173 lb 4.5 oz (78.6 kg) 185 lb (83.9 kg) 190 lb 8 oz (86.4 kg)   General: alert, comfortable, not ill looking   Heart: RRR Chest: diminished BS in right base, O/w clear and no SOB Abdomen: soft, ND, NT.  No obvious ascites or fluid wave  Extremities: no CC. Trivial ankle edema Neuro/Psych:  Oriented x 3.  Speech clear, slight word finding difficulty.  No asterixis.    Intake/Output from previous day: 05/28 0701 - 05/29 0700 In: 2622.5 [P.O.:1460; I.V.:1162.5] Out: -   Intake/Output this shift: No intake/output data recorded.  Lab Results: Recent Labs    05/16/18 0453 05/17/18 0458 05/18/18 0426  WBC 8.2 6.7 6.2  HGB 11.8* 11.6* 11.8*  HCT 36.5 36.2 37.0  PLT 150 110* 127*   BMET Recent Labs    05/16/18 0453 05/17/18 0458 05/18/18 0426  NA 133* 135 135  K 3.9 4.6 4.6  CL 105 108 106  CO2 20* 18* 24  GLUCOSE 99 95 88  BUN 29* 22* 20  CREATININE 1.37* 0.99 1.09*  CALCIUM 8.8* 8.8* 9.0   LFT Recent Labs    05/16/18 0453  05/17/18 0458 05/18/18 0426  PROT 6.7 5.9* 6.0*  ALBUMIN 2.9* 2.6* 2.6*  AST 56* 72* 53*  ALT 31 37 35  ALKPHOS 80 81 83  BILITOT 2.0* 1.8* 1.7*   PT/INR Recent Labs    05/15/18 1157  LABPROT 15.9*  INR 1.28   Hepatitis Panel No results for input(s): HEPBSAG, HCVAB, HEPAIGM, HEPBIGM in the last 72 hours.  Studies/Results: Dg Abd Portable 1v  Result Date: 05/16/2018 CLINICAL DATA:  Right flank pain. Epigastric pain. Back pain. Abdominal pain. EXAM: PORTABLE ABDOMEN - 1 VIEW COMPARISON:  04/23/2018 FINDINGS: There are no disproportionally dilated loops of bowel. There is no obvious free intraperitoneal gas. Small calcification projects over the left renal shadow suggesting nephrolithiasis. IMPRESSION: Nonobstructive bowel gas pattern. Electronically Signed   By: Marybelle Killings M.D.   On: 05/16/2018 15:24   Scheduled Meds: . feeding supplement (ENSURE ENLIVE)  237 mL Oral BID BM  . heparin  5,000 Units Subcutaneous Q8H  . lactulose  30 g Oral TID  . nadolol  20 mg Oral Daily  . pantoprazole  40 mg Oral BID AC  . rifaximin  550 mg Oral BID  . sucralfate  1 g Oral TID WC & HS   Continuous  Infusions: . sodium chloride 50 mL/hr at 05/17/18 2250  . magnesium sulfate 1 - 4 g bolus IVPB     PRN Meds:.bisacodyl, HYDROcodone-acetaminophen, ipratropium-albuterol, promethazine  ASSESMENT:   *   Hepatic encephalopathy Ammonia 137 >> 50.  On high dose lactulose and Rifaximin.     *   Fecal impaction, s/p disimpaction.      *  NASH cirrhosis.    *   Ascites. Slight per 5/25 ultrasound.  Paracentesis x 2, 5/5 and 5/7.  No diuretics currently.  Takes Lasix 80, aldactone 100 mg at home,   *   Right pleural effusion.  Hepatic hydrothorax.    *   AKI.  Improved.    *   Thrombocytopenia.  Non-critical.    *   03/24/18 right TKR.     PLAN   *  In future plan EGD for variceal screening.    *   AFP level pending but no findings s/o HCC on ultrasound 5/25   *  ? When to restart  diuretics and if so ?at lower doses? Due to renal dysfx.    *  Leave IVF and high dose lactulose in place.  Stop carafate.    Azucena Freed  05/18/2018, 10:08 AM Phone [phone]  GI ATTENDING  Interval history data reviewed. Patient personally seen and examined. Sister in room. No new complaints. Walking in hallway with assistance. No obvious encephalopathy on questioning or exam. No significant edema. Abdomen is soft. Hopefully able to go home soon with proper help. Discussed with Dr. Grandville Silos. The following are GI medications upon discharge:  1. Lasix 40 mg daily 2. Aldactone 100 mg daily 3. Xifaxan 550 mg twice daily 4. Lactulose 30 mL twice daily. Adjust dosage to achieve 3-5 bowel movements per day 5. Protonix 40 mg daily  GI follow-up with Dr. Carlean Purl for his advanced practitioner 2-4 weeks post discharge.  Will sign off. Please call for questions or problems. Thank you  Docia Chuck. Geri Seminole., M.D. [facility] Division of Gastroenterology

## 2018-05-18 NOTE — Progress Notes (Signed)
Chaplain provided support and education around advance directive.  Pt completed health care power of attorney.    [facility] / [facility] Chaplain Jerene Pitch, MDiv, [facility]

## 2018-05-18 NOTE — Progress Notes (Signed)
PROGRESS NOTE    Dawn Moon  ZJI:[id number] DOB: Oct 17, 1956 DOA: 05/13/2018 PCP: Leamon Arnt, MD   Brief Narrative:  Pt. with PMH of IBS,HFpEF,paroxysmal SVT, hypertension, andNASH; admitted on 05/13/2018, presented with complaint of nausea and weakness and confusion, was found to have hepatic encephalopathy with ammonia level of 167, constipation, acute kidney injury due to dehydration Currently further plan is continue current management.     Assessment & Plan:   Principal Problem:   Acute hepatic encephalopathy Active Problems:   Essential hypertension   GERD (gastroesophageal reflux disease)   Liver cirrhosis secondary to NASH (nonalcoholic steatohepatitis) (HCC)   History of paroxysmal supraventricular tachycardia   Heart failure (HCC)   Acute renal failure (ARF) (HCC)   Abdominal pain   AKI (acute kidney injury) ([place])  #1 acute hepatic encephalopathy Patient with history of cirrhosis secondary to [place].  Ammonia level on admission was 163.  Patient placed on lactulose and Xifaxan with clinical improvement.  Currently alert and oriented to self place and time.  Ammonia level now at 50.  Decrease lactulose to twice daily per GI recommendations.  Continue Xifaxan.  Follow.  2.  Cirrhosis secondary to NASH Diuretics initially held on admission secondary to acute kidney injury.  Seems compensated at this time.  Mentation improved with lactulose and Xifaxan.  Resume diuretics on discharge per GI recommendations.  Outpatient follow-up with GI.  3.  Hypertension Blood pressure somewhat borderline.  Diuretics on hold.  Currently on nadolol.  Outpatient follow-up.  4.  Chronic diastolic CHF Currently stable.  Diuretics on hold secondary to presentation with acute kidney injury.  Compensated.  Resume diuretics on discharge per GI recommendations.  5.  Acute kidney injury On admission creatinine noted to be 2.62 with a BUN of 32.  Felt likely secondary to diuretics as  patient was on high-dose Lasix and Aldactone on admission.  Diuretics on hold.  Patient has been placed on gentle hydration.  Renal ultrasound negative for any acute abnormalities.  Urine output not recorded.  Function trended down and creatinine currently at 1.09.  Outpatient follow-up.  6.  Headache Improved.  7.  Recent right total knee replacement Patient was started on aspirin postop twice daily.  Dr. Posey Pronto discussed with orthopedics was recommended that patient did not need aspirin beyond 1 month.  Aspirin has subsequently been discontinued to minimize the risk of bleeding and patient with cirrhosis.  8.  Abdominal pain after eating Improved.  Continue PPI.  Carafate discontinued per GI.   DVT prophylaxis: Heparin Code Status: Full Family Communication: Updated patient and family at bedside. Disposition Plan: Home when okay with gastroenterology.   Consultants:   Gastroenterology: Dr. Lyndel Safe 05/15/2018  Procedures:   Abdominal films 05/16/2017  Chest x-ray 05/14/2018, 05/13/2018  Renal ultrasound 05/14/2018  Antimicrobials:   None   Subjective: Patient is sitting up on the side of the bed.  States she is feeling jittery.  Denies any chest pain.  States ambulated in the hallway with some shortness of breath. Alert and oriented x3.  States had one formed bowel movement this morning.  Diarrhea slowed down.  Objective: Vitals:   05/17/18 1519 05/17/18 2219 05/18/18 0622 05/18/18 1410  BP:  120/73 (!) 100/51 (!) 118/59  Pulse:  76 78 90  Resp:  18 16 20   Temp:  98.6 F (37 C) 97.9 F (36.6 C) 97.9 F (36.6 C)  TempSrc:  Oral Oral Oral  SpO2: 94% 97% 96% 94%  Weight:   86.4  kg (190 lb 8 oz)   Height:        Intake/Output Summary (Last 24 hours) at 05/18/2018 1948 Last data filed at 05/18/2018 1836 Gross per 24 hour  Intake 1057.5 ml  Output -  Net 1057.5 ml   Filed Weights   05/16/18 0638 05/17/18 0500 05/18/18 0622  Weight: 78.6 kg (173 lb 4.5 oz) 83.9 kg (185  lb) 86.4 kg (190 lb 8 oz)    Examination:  General exam: Appears calm and comfortable  Respiratory system: Clear to auscultation. Respiratory effort normal. Cardiovascular system: S1 & S2 heard, RRR. No JVD, murmurs, rubs, gallops or clicks. No pedal edema. Gastrointestinal system: Abdomen is nondistended, soft and nontender. No organomegaly or masses felt. Normal bowel sounds heard. Central nervous system: Alert and oriented. No focal neurological deficits. Extremities: Symmetric 5 x 5 power. Skin: No rashes, lesions or ulcers Psychiatry: Judgement and insight appear normal. Mood & affect appropriate.     Data Reviewed: I have personally reviewed following labs and imaging studies  CBC: Recent Labs  Lab 05/13/18 1958 05/14/18 0425 05/15/18 0514 05/16/18 0453 05/17/18 0458 05/18/18 0426  WBC 9.0 7.4 8.4 8.2 6.7 6.2  NEUTROABS 7.0  --   --   --   --   --   HGB 14.4 12.8 13.1 11.8* 11.6* 11.8*  HCT 40.3 39.0 40.2 36.5 36.2 37.0  MCV 83.8 88.2 88.5 88.4 90.3 90.2  PLT 174 148* 155 150 110* 277*   Basic Metabolic Panel: Recent Labs  Lab 05/13/18 1004  05/14/18 0425 05/15/18 0514 05/16/18 0453 05/17/18 0458 05/18/18 0426  NA 135   < > 136 137 133* 135 135  K 4.7   < > 4.1 4.4 3.9 4.6 4.6  CL 95*   < > 104 106 105 108 106  CO2 19*   < > 20* 19* 20* 18* 24  GLUCOSE 117*   < > 124* 121* 99 95 88  BUN 28*   < > 32* 31* 29* 22* 20  CREATININE 2.50*   < > 2.40* 1.69* 1.37* 0.99 1.09*  CALCIUM 10.7*   < > 9.5 9.9 8.8* 8.8* 9.0  MG 2.3  --   --  2.1  --   --  1.8   < > = values in this interval not displayed.   GFR: Estimated Creatinine Clearance: 57.7 mL/min (A) (by C-G formula based on SCr of 1.09 mg/dL (H)). Liver Function Tests: Recent Labs  Lab 05/14/18 0425 05/15/18 0514 05/16/18 0453 05/17/18 0458 05/18/18 0426  AST 48* 66* 56* 72* 53*  ALT 25 33 31 37 35  ALKPHOS 81 94 80 81 83  BILITOT 1.8* 2.4* 2.0* 1.8* 1.7*  PROT 7.0 7.6 6.7 5.9* 6.0*  ALBUMIN 3.2*  3.4* 2.9* 2.6* 2.6*   No results for input(s): LIPASE, AMYLASE in the last 168 hours. Recent Labs  Lab 05/14/18 0425 05/15/18 0514 05/16/18 0453 05/17/18 0458 05/18/18 0426  AMMONIA 137* 67* 63* 75* 50*   Coagulation Profile: Recent Labs  Lab 05/15/18 1157  INR 1.28   Cardiac Enzymes: Recent Labs  Lab 05/13/18 1946  TROPONINI <0.03   BNP (last 3 results) No results for input(s): PROBNP in the last 8760 hours. HbA1C: No results for input(s): HGBA1C in the last 72 hours. CBG: No results for input(s): GLUCAP in the last 168 hours. Lipid Profile: No results for input(s): CHOL, HDL, LDLCALC, TRIG, CHOLHDL, LDLDIRECT in the last 72 hours. Thyroid Function Tests: No results for input(s): TSH,  T4TOTAL, FREET4, T3FREE, THYROIDAB in the last 72 hours. Anemia Panel: No results for input(s): VITAMINB12, FOLATE, FERRITIN, TIBC, IRON, RETICCTPCT in the last 72 hours. Sepsis Labs: No results for input(s): PROCALCITON, LATICACIDVEN in the last 168 hours.  No results found for this or any previous visit (from the past 240 hour(s)).       Radiology Studies: No results found.      Scheduled Meds: . feeding supplement (ENSURE ENLIVE)  237 mL Oral BID BM  . heparin  5,000 Units Subcutaneous Q8H  . [START ON 05/19/2018] lactulose  30 g Oral BID  . nadolol  20 mg Oral Daily  . [START ON 05/19/2018] pantoprazole  40 mg Oral Daily  . rifaximin  550 mg Oral BID   Continuous Infusions:   LOS: 5 days    Time spent: 35 minutes    Irine Seal, MD [facility] Pager [phone]  If 7PM-7AM, please contact night-coverage [email or web address] Password [email or web address] 05/18/2018, 7:48 PM

## 2018-05-19 ENCOUNTER — Telehealth: Payer: Self-pay | Admitting: Internal Medicine

## 2018-05-19 DIAGNOSIS — K7581 Nonalcoholic steatohepatitis (NASH): Principal | ICD-10-CM

## 2018-05-19 DIAGNOSIS — K219 Gastro-esophageal reflux disease without esophagitis: Secondary | ICD-10-CM

## 2018-05-19 DIAGNOSIS — K7460 Unspecified cirrhosis of liver: Secondary | ICD-10-CM

## 2018-05-19 LAB — COMPREHENSIVE METABOLIC PANEL
ALBUMIN: 2.5 g/dL — AB (ref 3.5–5.0)
ALT: 37 U/L (ref 14–54)
ANION GAP: 6 (ref 5–15)
AST: 58 U/L — AB (ref 15–41)
Alkaline Phosphatase: 101 U/L (ref 38–126)
BILIRUBIN TOTAL: 1.1 mg/dL (ref 0.3–1.2)
BUN: 17 mg/dL (ref 6–20)
CHLORIDE: 108 mmol/L (ref 101–111)
CO2: 21 mmol/L — ABNORMAL LOW (ref 22–32)
Calcium: 8.7 mg/dL — ABNORMAL LOW (ref 8.9–10.3)
Creatinine, Ser: 0.98 mg/dL (ref 0.44–1.00)
GFR calc Af Amer: >60 mL/min (ref >60)
GLUCOSE: 107 mg/dL — AB (ref 65–99)
POTASSIUM: 4 mmol/L (ref 3.5–5.1)
Sodium: 135 mmol/L (ref 135–145)
TOTAL PROTEIN: 5.7 g/dL — AB (ref 6.5–8.1)

## 2018-05-19 LAB — CBC
HCT: 36.2 % (ref 36.0–46.0)
Hemoglobin: 11.6 g/dL — ABNORMAL LOW (ref 12.0–15.0)
MCH: 28.6 pg (ref 26.0–34.0)
MCHC: 32 g/dL (ref 30.0–36.0)
MCV: 89.4 fL (ref 78.0–100.0)
PLATELETS: 120 10*3/uL — AB (ref 150–400)
RBC: 4.05 MIL/uL (ref 3.87–5.11)
RDW: 14.8 % (ref 11.5–15.5)
WBC: 6.8 10*3/uL (ref 4.0–10.5)

## 2018-05-19 LAB — AFP TUMOR MARKER: AFP, Serum, Tumor Marker: <0.7 ng/mL (ref 0.0–8.3)

## 2018-05-19 LAB — MAGNESIUM: Magnesium: 2.1 mg/dL (ref 1.7–2.4)

## 2018-05-19 LAB — AMMONIA: AMMONIA: 85 umol/L — AB (ref 9–35)

## 2018-05-19 MED ORDER — FUROSEMIDE 40 MG PO TABS: 40.0000 mg | ORAL_TABLET | Freq: Every day | ORAL | 0 refills | Status: DC

## 2018-05-19 MED ORDER — LACTULOSE 10 GM/15ML PO SOLN: 20.0000 g | Freq: Two times a day (BID) | ORAL | 0 refills | Status: DC

## 2018-05-19 MED ORDER — PROMETHAZINE HCL 25 MG PO TABS: 25.0000 mg | ORAL_TABLET | Freq: Four times a day (QID) | ORAL | 0 refills | Status: DC | PRN

## 2018-05-19 MED ORDER — HYDROXYZINE HCL 25 MG PO TABS: 25.0000 mg | ORAL_TABLET | Freq: Three times a day (TID) | ORAL | 0 refills | Status: DC | PRN

## 2018-05-19 MED ORDER — RIFAXIMIN 550 MG PO TABS: 550.0000 mg | ORAL_TABLET | Freq: Two times a day (BID) | ORAL | 0 refills | Status: DC

## 2018-05-19 MED ORDER — NADOLOL 20 MG PO TABS: 20.0000 mg | ORAL_TABLET | Freq: Every day | ORAL | 0 refills | Status: DC

## 2018-05-19 NOTE — Care Management Note (Signed)
Case Management Note  Patient Details  Name: [name] MRN: [id number] Date of Birth: 1956/03/26  Subjective/Objective:  Acute Hepatic Encephalopathy                  Action/Plan: Pt selected [facility] for [facility]   Expected Discharge Date:                  Expected Discharge Plan:  [place]  In-House Referral:     Discharge planning Services  CM Consult  Post Acute Care Choice:    Choice offered to:  Patient  DME Arranged:    DME Agency:     HH Arranged:  PT, RN, OT, Nurse's Aide [place] Agency:  [facility] (now [facility])  Status of Service:  Completed, signed off  If discussed at [employer] of Stay Meetings, dates discussed:    Additional CommentsPurcell Mouton, RN 05/19/2018, 12:25 PM

## 2018-05-19 NOTE — Telephone Encounter (Signed)
Jessica and Dr. Carlean Purl,  What labs would you like the patient to have and when?

## 2018-05-19 NOTE — Telephone Encounter (Signed)
Was discharged back on lower doses of diuretics.  Will need BMP in one week.

## 2018-05-19 NOTE — Discharge Summary (Addendum)
Physician Discharge Summary  Dawn Moon TGY:[id number] DOB: January 08, 1956 DOA: 05/13/2018  PCP: Leamon Arnt, MD  Admit date: 05/13/2018 Discharge date: 05/19/2018  Time spent: 65 minutes  Recommendations for Outpatient Follow-up:  1. Patient will be discharged home with home health services. 2. Follow-up with Dr. Carlean Purl gastroenterology in 2 weeks for follow-up on cirrhosis and hepatic encephalopathy. 3. Follow-up with Leamon Arnt, MD in 1 to 2 weeks.  On follow-up patient will need a basic metabolic profile done to follow-up on electrolytes and renal function. 4. Follow-up with Dr. Stanford Breed cardiology as previously scheduled.   Discharge Diagnoses:  Principal Problem:   Acute hepatic encephalopathy Active Problems:   Essential hypertension   GERD (gastroesophageal reflux disease)   Liver cirrhosis secondary to NASH (nonalcoholic steatohepatitis) (HCC)   History of paroxysmal supraventricular tachycardia   Heart failure (HCC)   Acute renal failure (ARF) (HCC)   Abdominal pain   AKI (acute kidney injury) ([facility])   Discharge Condition: Stable and improved.  Diet recommendation: Heart healthy.  Filed Weights   05/17/18 0500 05/18/18 0622 05/19/18 0536  Weight: 83.9 kg (185 lb) 86.4 kg (190 lb 8 oz) 87 kg (191 lb 12.8 oz)    History of present illness:  Per Dr. Lorenza Burton is a 62 y.o. female with medical history significant ofIBS, HFpEF, paroxysmal SVT, hypertension, andNASH; who presented with complaints of nausea, weakness, and confusion over the last 1 week. Patient had recently been hospitalized to undergo right knee replacement from 4/4-4/5.Subsequently, patient was readmitted into the hospital from 5/4-5/14for acute hypoxic respiratory failure 2/2right pleural effusion related to hepatic hydrothorax and ascites. Patient underwentthoracentesison 5/5 with removal2 L of fluid, paracentesis 5/7 with 2.9 L of fluid removed, and repeat thoracentesis  on 5/9 with 1.9 L of fluid removed.  Echocardiogram noted patient's EF 60 to 65% without evidence of diastolic dysfunction.   She was discharged home on 80 mg of Lasix and 200 mg of Aldactone.  Patient had been taking medications as prescribed since being discharged home, but has had a progressive decline.  She complained of  having difficulty with concentration and getting her words together.  Sleeping more than normal also no appetite.  She was seen by her cardiologist 4 days prior to admission, and her Spironolactone dose had been decreased from 200 mg to 100 mg daily.  Patient reported associated symptoms of intermittent crampy abdominal pain, intermittent nausea, vomiting, right flank pain, shortness of breath with exertion, and weight loss of approximate 10-15 pounds since discharge. Patient weight at 5/14 was 194 lb.  Denied having any significant fevers, chest pain, loss of consciousness, or focal weakness.   ED Course:  Upon admission to the emergency department patient was noted to be afebrile, pulse 99-120, respirations 13-27, and blood pressures 90/77-142/83, and O2 saturation maintained on room air.  Labs revealed acute renal failure creatinine 2.65, BUN 32, ammonia level 163. Chest x-ray was negative for any acute abnormalities. Patient was started on normal saline IV fluids at 125 mL/h and given 10 mg oflactulose. Vital signs currently reported to be stable. [facility] called to admit.Accepted to a telemetry bed as inpatient    Hospital Course:  1 acute hepatic encephalopathy Patient with history of cirrhosis secondary to NASH.  Ammonia level on admission was 163.  Patient placed on lactulose and Xifaxan with clinical improvement.  Patient improved clinically on a daily basis and was oriented to self place and time by day of discharge.  Patient  was maintained on lactulose and had multiple stools.  Lactulose dose will be decreased to 20 g twice daily with goal bowel movements of 3-5 bowel  movements daily per GI recommendations.  Patient also be discharged on Xifaxan.  Outpatient follow-up with Dr. Carlean Purl of gastroenterology.   2.  Cirrhosis secondary to NASH Diuretics initially held on admission secondary to acute kidney injury.  Seemed compensated at this time.  Mentation improved with lactulose and Xifaxan.  Diuretics will be resumed on discharge as per GI recommendations.  Outpatient follow-up with gastroenterology.  3.  Hypertension Blood pressure somewhat borderline.  Diuretics were held throughout the hospitalization.  Patient's atenolol was changed to nadolol.  Blood pressure improved.  Outpatient follow-up.   4.  Chronic diastolic CHF Main compensated during the hospitalization.  Diuretics were held on admission secondary to presentation with acute kidney injury.  Diuretics will be resumed on discharge at the lower dose per GI recommendations.  Outpatient follow-up.   5.  Acute kidney injury On admission creatinine noted to be 2.62 with a BUN of 32.  Felt likely secondary to diuretics as patient was on high-dose Lasix and Aldactone on admission.  Diuretics were held during the hospitalization and patient placed on gentle hydration. Renal ultrasound negative for any acute abnormalities.  Function trended down and improved daily such that by day of discharge  creatinine was down to 0.98.  Patient's diuretics will be resumed at a lower dose on discharge and will need close outpatient follow-up.   6.  Headache Improved.  7.  Recent right total knee replacement Patient was started on aspirin postop twice daily.  Dr. Posey Pronto discussed with orthopedics was recommended that patient did not need aspirin beyond 1 month.  Aspirin was subsequently discontinued to minimize the risk of bleeding in patient with cirrhosis.  8.  Abdominal pain after eating Improved with PPI and Carafate.  Carafate was subsequently discontinued by gastroenterology.  Patient will be discharged on a  PPI.  Outpatient follow-up with GI.      Procedures:  Abdominal films 05/16/2017  Chest x-ray 05/14/2018, 05/13/2018  Renal ultrasound 05/14/2018      Consultations:  Gastroenterology: Dr. Lyndel Safe 05/15/2018      Discharge Exam: Vitals:   05/19/18 0536 05/19/18 1540  BP: (!) 111/56 122/64  Pulse: 85 77  Resp: 16 18  Temp: 98.5 F (36.9 C) 98.2 F (36.8 C)  SpO2: 98% 98%    General: NAD Cardiovascular: RRR Respiratory: CTAB  Discharge Instructions   Discharge Instructions    Diet - low sodium heart healthy   Complete by:  As directed    Increase activity slowly   Complete by:  As directed      Allergies as of 05/19/2018   No Known Allergies     Medication List    STOP taking these medications   aspirin 81 MG chewable tablet   atenolol 25 MG tablet Commonly known as:  TENORMIN   promethazine 12.5 MG suppository Commonly known as:  PHENERGAN Replaced by:  promethazine 25 MG tablet     TAKE these medications   Butalbital-APAP-Caffeine 50-300-40 MG Caps Take 1 tablet by mouth every 8 (eight) hours as needed. What changed:  reasons to take this   Eluxadoline 75 MG Tabs Commonly known as:  VIBERZI Take 75 mg by mouth 2 (two) times daily.   furosemide 40 MG tablet Commonly known as:  LASIX Take 1 tablet (40 mg total) by mouth daily. What changed:    medication  strength  how much to take   HYDROcodone-acetaminophen 5-325 MG tablet Commonly known as:  NORCO/VICODIN Take 0.5-1 tablets by mouth every 6 (six) hours as needed for moderate pain.   hydrOXYzine 25 MG tablet Commonly known as:  ATARAX/VISTARIL Take 1 tablet (25 mg total) by mouth 3 (three) times daily as needed for anxiety.   lactulose 10 GM/15ML solution Commonly known as:  CHRONULAC Take 30 mLs (20 g total) by mouth 2 (two) times daily.   nadolol 20 MG tablet Commonly known as:  CORGARD Take 1 tablet (20 mg total) by mouth daily. Start taking on:  05/20/2018   ondansetron  4 MG tablet Commonly known as:  ZOFRAN Take 1 tablet (4 mg total) by mouth every 8 (eight) hours as needed for nausea or vomiting.   pantoprazole 40 MG tablet Commonly known as:  PROTONIX Take 1 tablet (40 mg total) by mouth every morning.   promethazine 25 MG tablet Commonly known as:  PHENERGAN Take 1 tablet (25 mg total) by mouth every 6 (six) hours as needed for nausea or vomiting. Replaces:  promethazine 12.5 MG suppository   rifaximin 550 MG Tabs tablet Commonly known as:  XIFAXAN Take 1 tablet (550 mg total) by mouth 2 (two) times daily.   spironolactone 100 MG tablet Commonly known as:  ALDACTONE Take 100 mg by mouth daily.      No Known Allergies Follow-up Information    Gatha Mayer, MD. Schedule an appointment as soon as possible for a visit in 2 week(s).   Specialty:  Gastroenterology Contact information: [address] [place] [address] [phone]        Leamon Arnt, MD. Schedule an appointment as soon as possible for a visit in 1 week(s).   Specialty:  Family Medicine Why:  F/U IN 1-2 WEEKS. Contact information: [address] [place]  [address] [phone]        Lelon Perla, MD .   Specialty:  Cardiology Contact information: [address] [place] [address] [phone]            The results of significant diagnostics from this hospitalization (including imaging, microbiology, ancillary and laboratory) are listed below for reference.    Significant Diagnostic Studies: Dg Chest 1 View  Result Date: 04/28/2018 CLINICAL DATA:  Status post right thoracentesis. EXAM: CHEST  1 VIEW COMPARISON:  04/28/2018 at 10:12 a.m. FINDINGS: Since the prior exam, there has been a reduction in the right-sided pleural effusion. There is still opacity that extends from the right mid to lower lung obscuring the right heart border and right hemidiaphragm consistent with residual pleural fluid and either pneumonia or  atelectasis. No pneumothorax. Left lung remains clear.  No left pleural effusion. IMPRESSION: 1. Significant decrease in right pleural fluid following thoracentesis. No pneumothorax. 2. Moderate residual pleural effusion with associated lung base opacity consistent with atelectasis or pneumonia. Electronically Signed   By: Lajean Manes M.D.   On: 04/28/2018 16:12   Dg Chest 1 View  Result Date: 04/24/2018 CLINICAL DATA:  Status post right thoracentesis. EXAM: CHEST  1 VIEW COMPARISON:  04/23/2018 FINDINGS: Significant reduction in right pleural fluid volume with underlying airspace disease of the right lower lung representing either pneumonia or residual atelectasis. No pneumothorax. No pulmonary edema. Stable mild cardiac enlargement. IMPRESSION: No pneumothorax with significant reduction in right pleural fluid volume after thoracentesis. Underlying pneumonia versus atelectasis of the right lower lung. Electronically Signed   By: Aletta Edouard  M.D.   On: 04/24/2018 09:13   Dg Chest 2 View  Result Date: 05/13/2018 CLINICAL DATA:  Short of breath EXAM: CHEST - 2 VIEW COMPARISON:  05/06/2018, 05/03/2018 FINDINGS: Clearing of previously noted pleural effusion. No acute consolidation or effusion. Normal heart size. No pneumothorax. Mild kyphosis of the spine with wedging at the thoracolumbar junction. IMPRESSION: No active cardiopulmonary disease. Electronically Signed   By: Donavan Foil M.D.   On: 05/13/2018 20:50   Dg Chest 2 View  Result Date: 05/06/2018 CLINICAL DATA:  Right pleural effusion. EXAM: CHEST - 2 VIEW COMPARISON:  Radiographs of May 03, 2018. FINDINGS: The heart size and mediastinal contours are within normal limits. Left lung is clear. No pneumothorax is noted. Mild right pleural effusion is noted which is significantly smaller compared to prior exam. Probable right basilar atelectasis is noted as well. The visualized skeletal structures are unremarkable. IMPRESSION: Mild right pleural  effusion with associated subsegmental atelectasis is noted which is improved compared to prior exam. Electronically Signed   By: Marijo Conception, M.D.   On: 05/06/2018 16:16   Dg Chest 2 View  Result Date: 05/03/2018 CLINICAL DATA:  Follow-up right pleural effusion. EXAM: CHEST - 2 VIEW COMPARISON:  Chest x-ray of May 02, 2018 FINDINGS: There is a moderate-sized right pleural effusion unchanged from yesterday's study. It occupies approximately 1/3 of the pleural space volume. There is no pleural effusion on the left. The aerated portion of the right lung and the entire left lung exhibit mild chronically increased interstitial markings. The heart is top-normal in size. The pulmonary vascularity is normal. The trachea is midline. The bony thorax exhibits no acute abnormality. IMPRESSION: Stable moderate-sized right sided pleural effusion. Probable underlying chronic bronchitic changes. Electronically Signed   By: David  Martinique M.D.   On: 05/03/2018 11:54   Dg Chest 2 View  Result Date: 05/02/2018 CLINICAL DATA:  Cough, shortness of breath, and right flank pain beginning this morning. EXAM: CHEST - 2 VIEW COMPARISON:  Chest x-ray of Apr 30, 2018 FINDINGS: There is no pneumothorax. There is a moderate-sized right pleural effusion. There is no mediastinal shift. The left lung is clear. The heart and pulmonary vascularity are normal. IMPRESSION: Moderate-sized right pleural effusion slightly more conspicuous than from the study of 2 days ago. No pneumothorax. Electronically Signed   By: David  Martinique M.D.   On: 05/02/2018 08:17   Dg Chest 2 View  Result Date: 04/28/2018 CLINICAL DATA:  62 year old female with a history of increased shortness of breath, with recurrent pleural effusion and cirrhosis EXAM: CHEST - 2 VIEW COMPARISON:  04/26/2018, 04/24/2018 FINDINGS: Cardiomediastinal silhouette likely unchanged, with the right heart border partially obscured by overlying lung and pleural disease. Increasing  opacity of the right chest with complete opacification no residual aeration. Left lung relatively well aerated. IMPRESSION: Increasing right-sided pleural effusion with complete opacification of the right hemithorax in this patient with known hepatic hydrothorax. If a candidate, referral for TIPS evaluation may be considered. Electronically Signed   By: Corrie Mckusick D.O.   On: 04/28/2018 12:14   Dg Chest 2 View  Result Date: 04/26/2018 CLINICAL DATA:  Follow-up right pleural effusion EXAM: CHEST - 2 VIEW COMPARISON:  04/24/2018 FINDINGS: There is been significant increase in the degree of right-sided pleural effusion when compare with the prior exam. Cardiac shadow remains enlarged. Left lung remains clear. IMPRESSION: Significant recurrent right-sided pleural effusion greater than that seen on the pre-thoracentesis images. Electronically Signed   By: Elta Guadeloupe  Lukens M.D.   On: 04/26/2018 11:05   Dg Chest 2 View  Result Date: 04/23/2018 CLINICAL DATA:  62 year old female with acute onset of cough and right-sided chest pain and shortness of breath. EXAM: CHEST - 2 VIEW COMPARISON:  Chest radiograph dated 01/20/2018 FINDINGS: There is a moderate size right-sided pleural effusion, new compared to the prior radiograph. There is associated compressive atelectasis of the majority of the right lower and right middle lobes. There is a small aerated portion of the lung in the right upper lobe. The left lung is clear. There is no pneumothorax. The right cardiac borders are silhouetted. No acute osseous pathology. IMPRESSION: Moderate right pleural effusion with compressive atelectasis of the majority of the right lung, new compared to the prior radiograph. Findings may be related to an infectious etiology although underlying mass is not entirely excluded. Clinical correlation and follow-up to resolution recommended. CT may provide additional evaluation if thoracentesis is considered for symptom relief. Electronically  Signed   By: Anner Crete M.D.   On: 04/23/2018 21:36   Ct Angio Chest Pe W/cm &/or Wo Cm  Result Date: 04/23/2018 CLINICAL DATA:  62 y/o F; shortness of breath and abdominal pain with bloating. Knee surgery 1 month ago. EXAM: CT ANGIOGRAPHY CHEST CT ABDOMEN AND PELVIS WITH CONTRAST TECHNIQUE: Multidetector CT imaging of the chest was performed using the standard protocol during bolus administration of intravenous contrast. Multiplanar CT image reconstructions and MIPs were obtained to evaluate the vascular anatomy. Multidetector CT imaging of the abdomen and pelvis was performed using the standard protocol during bolus administration of intravenous contrast. CONTRAST:  29m ISOVUE-370 IOPAMIDOL (ISOVUE-370) INJECTION 76% COMPARISON:  06/10/2011 CT abdomen and pelvis. FINDINGS: CTA CHEST FINDINGS Cardiovascular: Satisfactory opacification of pulmonary arteries. Respiratory motion artifact. No pulmonary embolus identified. Mild coronary artery calcification. Normal heart size. No pericardial effusion. Normal caliber thoracic aorta. Mediastinum/Nodes: No enlarged mediastinal, hilar, or axillary lymph nodes. Thyroid gland, trachea, and esophagus demonstrate no significant findings. Lungs/Pleura: Large right pleural effusion with atelectasis of right lower lobe and partial atelectasis of the right upper lobes and middle lobe. Clear left lung. Musculoskeletal: No chest wall abnormality. No acute or significant osseous findings. Review of the MIP images confirms the above findings. CT ABDOMEN and PELVIS FINDINGS Hepatobiliary: Lobulation of liver contour with enlargement of the left lobe compatible with cirrhosis. Normal gallbladder. No biliary ductal dilatation. Pancreas: Unremarkable. No pancreatic ductal dilatation or surrounding inflammatory changes. Spleen: Normal in size without focal abnormality. Adrenals/Urinary Tract: Adrenal glands are unremarkable. Kidneys are normal, without renal calculi, focal lesion,  or hydronephrosis. Bladder is unremarkable. Stomach/Bowel: Stomach is within normal limits. Appendix appears normal. No evidence of bowel wall thickening, distention, or inflammatory changes. Vascular/Lymphatic: No significant vascular findings are present. Enlargement of mesenteric lymph nodes measuring up to 18 x 14 mm (series 13, image 41). Reproductive: Uterus and bilateral adnexa are unremarkable. Other: Large volume of ascites. Musculoskeletal: No fracture is seen. Stable L1 mild anterior compression deformity. Review of the MIP images confirms the above findings. IMPRESSION: 1. Respiratory motion artifact.  No pulmonary embolus identified. 2. Large right pleural effusion with atelectasis of right lower lobe and partial atelectasis of right upper and middle lobes. 3. Lobulated liver with enlarged left lobe compatible with cirrhosis. 4. Large volume of ascites. 5. Mild enlargement of mesenteric lymph nodes may represent underlying inflammation or be due to venous congestion. Electronically Signed   By: LKristine GarbeM.D.   On: 04/23/2018 22:12   Ct Abdomen  Pelvis W Contrast  Result Date: 04/23/2018 CLINICAL DATA:  62 y/o F; shortness of breath and abdominal pain with bloating. Knee surgery 1 month ago. EXAM: CT ANGIOGRAPHY CHEST CT ABDOMEN AND PELVIS WITH CONTRAST TECHNIQUE: Multidetector CT imaging of the chest was performed using the standard protocol during bolus administration of intravenous contrast. Multiplanar CT image reconstructions and MIPs were obtained to evaluate the vascular anatomy. Multidetector CT imaging of the abdomen and pelvis was performed using the standard protocol during bolus administration of intravenous contrast. CONTRAST:  45m ISOVUE-370 IOPAMIDOL (ISOVUE-370) INJECTION 76% COMPARISON:  06/10/2011 CT abdomen and pelvis. FINDINGS: CTA CHEST FINDINGS Cardiovascular: Satisfactory opacification of pulmonary arteries. Respiratory motion artifact. No pulmonary embolus  identified. Mild coronary artery calcification. Normal heart size. No pericardial effusion. Normal caliber thoracic aorta. Mediastinum/Nodes: No enlarged mediastinal, hilar, or axillary lymph nodes. Thyroid gland, trachea, and esophagus demonstrate no significant findings. Lungs/Pleura: Large right pleural effusion with atelectasis of right lower lobe and partial atelectasis of the right upper lobes and middle lobe. Clear left lung. Musculoskeletal: No chest wall abnormality. No acute or significant osseous findings. Review of the MIP images confirms the above findings. CT ABDOMEN and PELVIS FINDINGS Hepatobiliary: Lobulation of liver contour with enlargement of the left lobe compatible with cirrhosis. Normal gallbladder. No biliary ductal dilatation. Pancreas: Unremarkable. No pancreatic ductal dilatation or surrounding inflammatory changes. Spleen: Normal in size without focal abnormality. Adrenals/Urinary Tract: Adrenal glands are unremarkable. Kidneys are normal, without renal calculi, focal lesion, or hydronephrosis. Bladder is unremarkable. Stomach/Bowel: Stomach is within normal limits. Appendix appears normal. No evidence of bowel wall thickening, distention, or inflammatory changes. Vascular/Lymphatic: No significant vascular findings are present. Enlargement of mesenteric lymph nodes measuring up to 18 x 14 mm (series 13, image 41). Reproductive: Uterus and bilateral adnexa are unremarkable. Other: Large volume of ascites. Musculoskeletal: No fracture is seen. Stable L1 mild anterior compression deformity. Review of the MIP images confirms the above findings. IMPRESSION: 1. Respiratory motion artifact.  No pulmonary embolus identified. 2. Large right pleural effusion with atelectasis of right lower lobe and partial atelectasis of right upper and middle lobes. 3. Lobulated liver with enlarged left lobe compatible with cirrhosis. 4. Large volume of ascites. 5. Mild enlargement of mesenteric lymph nodes may  represent underlying inflammation or be due to venous congestion. Electronically Signed   By: LKristine GarbeM.D.   On: 04/23/2018 22:12   [place]al  Result Date: 05/14/2018 CLINICAL DATA:  Right flank pain.  Hypertension. EXAM: RENAL / URINARY TRACT ULTRASOUND COMPLETE COMPARISON:  CT abdomen and pelvis Apr 23, 2018 FINDINGS: Right Kidney: Length: 10.6 cm. Echogenicity and renal cortical thickness are within normal limits. No mass, perinephric fluid, or hydronephrosis visualized. No sonographically demonstrable calculus or ureterectasis. Left Kidney: Length: 11.2 cm. Echogenicity and renal cortical thickness are within normal limits. No mass, perinephric fluid, or hydronephrosis visualized. No sonographically demonstrable calculus or ureterectasis. Bladder: Appears normal for degree of bladder distention. A small amount of ascites is noted. Incidental note is made of splenic prominence. Spleen measures 12.3 x 7.5 x 12.1 cm with a measured splenic volume of 587 cubic cm. IMPRESSION: Normal appearing kidneys bilaterally. Splenic prominence as noted. Slight ascites. Electronically Signed   By: WLowella GripIII M.D.   On: 05/14/2018 14:16   [place]orea[place]acentesis  Result Date: 04/25/2018 INDICATION: Patient with history of NASH cirrhosis, ascites. Request made for diagnostic and therapeutic paracentesis. EXAM: ULTRASOUND GUIDED DIAGNOSTIC AND THERAPEUTIC PARACENTESIS MEDICATIONS: None COMPLICATIONS: None immediate. PROCEDURE: Informed written  consent was obtained from the patient after a discussion of the risks, benefits and alternatives to treatment. A timeout was performed prior to the initiation of the procedure. Initial ultrasound scanning demonstrates a small to moderate amount of ascites within the right lower abdominal quadrant. The right lower abdomen was prepped and draped in the usual sterile fashion. 1% lidocaine was used for local anesthesia. Following this, a 6 Fr Safe-T-Centesis catheter was  introduced. An ultrasound image was saved for documentation purposes. The paracentesis was performed. The catheter was removed and a dressing was applied. The patient tolerated the procedure well without immediate post procedural complication. FINDINGS: A total of approximately 2.9 liters of hazy, amber fluid was removed. Samples were sent to the laboratory as requested by the clinical team. IMPRESSION: Successful ultrasound-guided diagnostic and therapeutic paracentesis yielding 2.9 liters of peritoneal fluid. Read by: Rowe Robert, PA-C Electronically Signed   By: Lucrezia Europe M.D.   On: 04/25/2018 14:19   Dg Chest Port 1 View  Result Date: 05/14/2018 CLINICAL DATA:  Shortness of breath. EXAM: PORTABLE CHEST 1 VIEW COMPARISON:  05/13/2018 FINDINGS: The heart size and mediastinal contours are within normal limits. Both lungs are clear. The visualized skeletal structures are unremarkable. IMPRESSION: No active disease. Electronically Signed   By: Kerby Moors M.D.   On: 05/14/2018 10:00   Dg Chest Port 1 View  Result Date: 04/30/2018 CLINICAL DATA:  Recurrent right pleural effusion EXAM: PORTABLE CHEST 1 VIEW COMPARISON:  04/28/2018 FINDINGS: There is a small right pleural effusion decreased compared with 04/28/2018. There is no left pleural effusion. There is no focal consolidation. There is no pneumothorax. The osseous structures are unremarkable. IMPRESSION: Small right pleural effusion. Electronically Signed   By: Kathreen Devoid   On: 04/30/2018 13:15   Dg Abd Portable 1v  Result Date: 05/16/2018 CLINICAL DATA:  Right flank pain. Epigastric pain. Back pain. Abdominal pain. EXAM: PORTABLE ABDOMEN - 1 VIEW COMPARISON:  04/23/2018 FINDINGS: There are no disproportionally dilated loops of bowel. There is no obvious free intraperitoneal gas. Small calcification projects over the left renal shadow suggesting nephrolithiasis. IMPRESSION: Nonobstructive bowel gas pattern. Electronically Signed   By: Marybelle Killings M.D.   On: 05/16/2018 15:24   US Thoracentesis Asp Pleural Space W/img Guide  Result Date: 04/28/2018 INDICATION: NASH cirrhosis, dyspnea, recurrent right pleural effusion. Request made for therapeutic right thoracentesis. EXAM: ULTRASOUND GUIDED THERAPEUTIC RIGHT THORACENTESIS MEDICATIONS: None COMPLICATIONS: None immediate. PROCEDURE: An ultrasound guided thoracentesis was thoroughly discussed with the patient and questions answered. The benefits, risks, alternatives and complications were also discussed. The patient understands and wishes to proceed with the procedure. Written consent was obtained. Ultrasound was performed to localize and mark an adequate pocket of fluid in the right chest. The area was then prepped and draped in the normal sterile fashion. 1% Lidocaine was used for local anesthesia. Under ultrasound guidance a 6 Fr Safe-T-Centesis catheter was introduced. Thoracentesis was performed. The catheter was removed and a dressing applied. FINDINGS: A total of approximately 1.9 liters of turbid, amber fluid was removed. IMPRESSION: Successful ultrasound guided therapeutic right thoracentesis yielding 1.9 liters of pleural fluid. Read by: Rowe Robert, PA-C Electronically Signed   By: Sandi Mariscal M.D.   On: 04/28/2018 15:51   US Thoracentesis Asp Pleural Space W/img Guide  Result Date: 04/24/2018 INDICATION: History of NASH cirrhosis. Shortness of breath. Right pleural effusion. Request for diagnostic and therapeutic thoracentesis. EXAM: ULTRASOUND GUIDED RIGHT THORACENTESIS MEDICATIONS: 1% Lidocaine = 10 mL COMPLICATIONS: None  immediate. PROCEDURE: An ultrasound guided thoracentesis was thoroughly discussed with the patient and questions answered. The benefits, risks, alternatives and complications were also discussed. The patient understands and wishes to proceed with the procedure. Written consent was obtained. Ultrasound was performed to localize and mark an adequate pocket of fluid in the  right chest. The area was then prepped and draped in the normal sterile fashion. 1% Lidocaine was used for local anesthesia. Under ultrasound guidance a 6 Fr Safe-T-Centesis catheter was introduced. Thoracentesis was performed. The catheter was removed and a dressing applied. FINDINGS: A total of approximately 2 liters of cloudy yellow fluid was removed. Samples were sent to the laboratory as requested by the clinical team. IMPRESSION: Successful ultrasound guided right thoracentesis yielding 2 liters of pleural fluid. No pneumothorax on post procedure chest X-ray. Read by: Gareth [place], PA-C Electronically Signed   By: Aletta Edouard M.D.   On: 04/24/2018 12:56    Microbiology: No results found for this or any previous visit (from the past 240 hour(s)).   Labs: Basic Metabolic Panel: Recent Labs  Lab 05/13/18 1004  05/15/18 0514 05/16/18 0453 05/17/18 0458 05/18/18 0426 05/19/18 0439  NA 135   < > 137 133* 135 135 135  K 4.7   < > 4.4 3.9 4.6 4.6 4.0  CL 95*   < > 106 105 108 106 108  CO2 19*   < > 19* 20* 18* 24 21*  GLUCOSE 117*   < > 121* 99 95 88 107*  BUN 28*   < > 31* 29* 22* 20 17  CREATININE 2.50*   < > 1.69* 1.37* 0.99 1.09* 0.98  CALCIUM 10.7*   < > 9.9 8.8* 8.8* 9.0 8.7*  MG 2.3  --  2.1  --   --  1.8 2.1   < > = values in this interval not displayed.   Liver Function Tests: Recent Labs  Lab 05/15/18 0514 05/16/18 0453 05/17/18 0458 05/18/18 0426 05/19/18 0439  AST 66* 56* 72* 53* 58*  ALT 33 31 37 35 37  ALKPHOS 94 80 81 83 101  BILITOT 2.4* 2.0* 1.8* 1.7* 1.1  PROT 7.6 6.7 5.9* 6.0* 5.7*  ALBUMIN 3.4* 2.9* 2.6* 2.6* 2.5*   No results for input(s): LIPASE, AMYLASE in the last 168 hours. Recent Labs  Lab 05/15/18 0514 05/16/18 0453 05/17/18 0458 05/18/18 0426 05/19/18 0439  AMMONIA 67* 63* 75* 50* 85*   CBC: Recent Labs  Lab 05/13/18 1958  05/15/18 0514 05/16/18 0453 05/17/18 0458 05/18/18 0426 05/19/18 0439  WBC 9.0   < > 8.4 8.2 6.7 6.2 6.8   NEUTROABS 7.0  --   --   --   --   --   --   HGB 14.4   < > 13.1 11.8* 11.6* 11.8* 11.6*  HCT 40.3   < > 40.2 36.5 36.2 37.0 36.2  MCV 83.8   < > 88.5 88.4 90.3 90.2 89.4  PLT 174   < > 155 150 110* 127* 120*   < > = values in this interval not displayed.   Cardiac Enzymes: Recent Labs  Lab 05/13/18 1946  TROPONINI <0.03   BNP: BNP (last 3 results) Recent Labs    05/13/18 1958  BNP 22.3    ProBNP (last 3 results) No results for input(s): PROBNP in the last 8760 hours.  CBG: No results for input(s): GLUCAP in the last 168 hours.     Signed:  Irine Seal MD.  [facility]  05/19/2018, 4:28 PM

## 2018-05-19 NOTE — Progress Notes (Signed)
Pt discharged to home, instructions given, acknowledge understanding. Pt d/c with husband. SRP,RN

## 2018-05-20 ENCOUNTER — Telehealth: Payer: Self-pay | Admitting: Internal Medicine

## 2018-05-20 NOTE — Telephone Encounter (Signed)
I spoke with Barbera Setters and she states next available opening with Janett Billow is fine to schedule. Left message for patient to return my call

## 2018-05-20 NOTE — Telephone Encounter (Signed)
I left a detailed message for the patient to come for labs next week Thursday or Friday.  She is asked to call back if she has any additional questions or concerns.

## 2018-05-23 ENCOUNTER — Telehealth: Payer: Self-pay | Admitting: Family Medicine

## 2018-05-23 NOTE — Telephone Encounter (Signed)
Okay for verbal orders to be given?

## 2018-05-23 NOTE — Telephone Encounter (Signed)
Yes please

## 2018-05-23 NOTE — Telephone Encounter (Signed)
Copied from [place] [id number]. Topic: Quick Communication - See Telephone Encounter >> May 23, 2018 10:42 AM Neva Seat wrote: [place] [phone]  Verbal Orders: Monitor pulmonary status  2 week for 2 weeks 1 week for 2 weeks  2 PRN

## 2018-05-23 NOTE — Telephone Encounter (Signed)
Verbal orders were given to [facility] with [facility]

## 2018-05-25 ENCOUNTER — Other Ambulatory Visit (INDEPENDENT_AMBULATORY_CARE_PROVIDER_SITE_OTHER): Payer: 59

## 2018-05-25 ENCOUNTER — Encounter: Payer: Self-pay | Admitting: Gastroenterology

## 2018-05-25 ENCOUNTER — Ambulatory Visit (INDEPENDENT_AMBULATORY_CARE_PROVIDER_SITE_OTHER): Payer: 59 | Admitting: Gastroenterology

## 2018-05-25 VITALS — BP 120/76 | HR 92 | Ht 63.0 in | Wt 187.2 lb

## 2018-05-25 DIAGNOSIS — K7581 Nonalcoholic steatohepatitis (NASH): Secondary | ICD-10-CM

## 2018-05-25 DIAGNOSIS — K7460 Unspecified cirrhosis of liver: Secondary | ICD-10-CM

## 2018-05-25 LAB — BASIC METABOLIC PANEL
BUN: 13 mg/dL (ref 6–23)
CHLORIDE: 101 meq/L (ref 96–112)
CO2: 29 meq/L (ref 19–32)
CREATININE: 1.03 mg/dL (ref 0.40–1.20)
Calcium: 9.9 mg/dL (ref 8.4–10.5)
GFR: 57.76 mL/min — ABNORMAL LOW (ref >60.00)
GLUCOSE: 81 mg/dL (ref 70–99)
Potassium: 4.6 mEq/L (ref 3.5–5.1)
Sodium: 137 mEq/L (ref 135–145)

## 2018-05-25 MED ORDER — RIFAXIMIN 550 MG PO TABS
550.0000 mg | ORAL_TABLET | Freq: Two times a day (BID) | ORAL | 5 refills | Status: DC
Start: 2018-05-25 — End: 2021-10-08

## 2018-05-25 MED ORDER — LACTULOSE 10 GM/15ML PO SOLN: 20.0000 g | Freq: Two times a day (BID) | ORAL | 0 refills | Status: DC

## 2018-05-25 MED ORDER — SPIRONOLACTONE 100 MG PO TABS: 100.0000 mg | ORAL_TABLET | Freq: Every day | ORAL | 1 refills | Status: DC

## 2018-05-25 NOTE — Progress Notes (Addendum)
05/25/2018 Dawn Moon [id number] 01-16-1956   HISTORY OF PRESENT ILLNESS:  Patient here for hospital follow-up of her NASH cirrhosis.    Recent diagnosis and decompensation since knee replacement (03/2018). Recent admission with large right pleural effusion/hepatic hydrothorax and ascites. Was on lasix 80 mg daily and spironolactone 200 mg daily at home. Then admitted 5/25-5/30 with confusion, AKI, and dehydration. Ammonia level was 163. They had her on high doses of lactulose and she had a lot of diarrhea. Diuretics were held.  Lactulose decreased and ammonia was down to 63.  Cr improved.  MELD 18 during hospital stay.  Was discharged on lactulose, lower doses of diuretics, and nadolol 20 mg daily.  Xifaxan was added as well but has not received as outpatient yet since insurance has not approved.  Feeling better.  Nausea is minimal and has only needed phenergan a couple of times.  BP has been running low, was 90/70 first thing this AM so she did not take her nadolol.  Appetite is better, drinking fluids, following 2 gram sodium diet.  Still has no energy and endurance is terrible.     Past Medical History:  Diagnosis Date  . GERD (gastroesophageal reflux disease)   . History of exercise stress test    03-07-2010  Stress echo--- no arrhythmias or conduction abnormalilites and negative for ischemia or chest pain  . History of kidney stones   . History of paroxysmal supraventricular tachycardia    episode 2011  consult w/ dr Caryl Comes --  put on atenolol--  per pt no longer an issue  . HTN (hypertension)    cardiologist --  dr Stanford Breed  . IBS (irritable bowel syndrome)    diarrhea  . Nonalcoholic steatohepatitis (NASH)   . Numbness and tingling of left lower extremity    post achilles tendon repair  . OA (osteoarthritis)    knees  . PONV (postoperative nausea and vomiting)   . Vitamin D deficiency   . Wears glasses    Past Surgical History:  Procedure Laterality Date  .  ACHILLES TENDON SURGERY Left 06/2016  . BREAST BIOPSY Left 02/2016   benign  . COLONOSCOPY  2005  . CT CTA CORONARY W/CA SCORE W/CM &/OR WO/CM  01/01/2014   non-obstructive calcified plaque in pLAD (0-25%), no significant incidental noncardiac findings noted  . ESOPHAGOGASTRODUODENOSCOPY  01/2014  . EXTRACORPOREAL SHOCK WAVE LITHOTRIPSY  2010  . KNEE ARTHROPLASTY Right 03/24/2018   Procedure: RIGHT TOTAL KNEE ARTHROPLASTY WITH COMPUTER NAVIGATION;  Surgeon: Rod Can, MD;  Location: [facility];  Service: Orthopedics;  Laterality: Right;  Needs RNFA  . KNEE ARTHROSCOPY Right 12/04/2016   Procedure: ARTHROSCOPY KNEE WITH PARTIAL MEDIAL MENISCECTOMY;  Surgeon: Rod Can, MD;  Location: [place];  Service: Orthopedics;  Laterality: Right;  . LEFT HEART CATHETERIZATION WITH CORONARY ANGIOGRAM N/A 01/11/2012   Procedure: LEFT HEART CATHETERIZATION WITH CORONARY ANGIOGRAM;  Surgeon: Sherren Mocha, MD;  Location: [facility] CATH LAB;  Service: Cardiovascular;  Laterality: N/A;  widely patent coronary arterires without significant obstructive CAD,  normal LVF, ef 55-56%  . TONSILLECTOMY AND ADENOIDECTOMY  child  . TRANSTHORACIC ECHOCARDIOGRAM  01/18/2012   mild LVH,  ef 60%/  trivial TR  . TUBAL LIGATION  1985    reports that she has never smoked. She has never used smokeless tobacco. She reports that she drank alcohol. She reports that she does not use drugs. family history includes Arthritis in her sister; Celiac disease in her daughter; Coronary  artery disease in her father and mother; Diabetes in her father; Healthy in her brother; Hypertension in her sister; IgA nephropathy in her daughter; Lymphoma in her mother. No Known Allergies    Outpatient Encounter Medications as of 05/25/2018  Medication Sig  . Butalbital-APAP-Caffeine 50-300-40 MG CAPS Take 1 tablet by mouth every 8 (eight) hours as needed. (Patient taking differently: Take 1 tablet by mouth every 8 (eight) hours as  needed (for pain.). )  . Eluxadoline (VIBERZI) 75 MG TABS Take 75 mg by mouth 2 (two) times daily. (Patient taking differently: Take 75 mg by mouth daily. )  . furosemide (LASIX) 40 MG tablet Take 1 tablet (40 mg total) by mouth daily.  Marland Kitchen HYDROcodone-acetaminophen (NORCO/VICODIN) 5-325 MG tablet Take 0.5-1 tablets by mouth every 6 (six) hours as needed for moderate pain.   . hydrOXYzine (ATARAX/VISTARIL) 25 MG tablet Take 1 tablet (25 mg total) by mouth 3 (three) times daily as needed for anxiety.  Marland Kitchen lactulose (CHRONULAC) 10 GM/15ML solution Take 30 mLs (20 g total) by mouth 2 (two) times daily.  . nadolol (CORGARD) 20 MG tablet Take 1 tablet (20 mg total) by mouth daily.  . pantoprazole (PROTONIX) 40 MG tablet Take 1 tablet (40 mg total) by mouth every morning.  . promethazine (PHENERGAN) 25 MG tablet Take 1 tablet (25 mg total) by mouth every 6 (six) hours as needed for nausea or vomiting.  Marland Kitchen spironolactone (ALDACTONE) 100 MG tablet Take 100 mg by mouth daily.  . rifaximin (XIFAXAN) 550 MG TABS tablet Take 1 tablet (550 mg total) by mouth 2 (two) times daily. (Patient not taking: Reported on 05/25/2018)  . [DISCONTINUED] ondansetron (ZOFRAN) 4 MG tablet Take 1 tablet (4 mg total) by mouth every 8 (eight) hours as needed for nausea or vomiting.   No facility-administered encounter medications on file as of 05/25/2018.      REVIEW OF SYSTEMS  : All other systems reviewed and negative except where noted in the History of Present Illness.   PHYSICAL EXAM: BP 120/76 (BP Location: Left Arm, Patient Position: Sitting, Cuff Size: Normal)   Pulse 92   Ht 5' 3"  (1.6 m) Comment: height measured without shoes  Wt 187 lb 4 oz (84.9 kg)   BMI 33.17 kg/m  General: Well developed white female in no acute distress Head: Normocephalic and atraumatic Eyes:  Sclerae anicteric, conjunctiva pink. Ears: Normal auditory acuity Lungs: Clear throughout to auscultation; no increased WOB. Heart: Regular rate and  rhythm; no M/R/G. Abdomen: Soft, non-distended.  BS present.  Non-tender. Musculoskeletal: Symmetrical with no gross deformities  Skin: No lesions on visible extremities Extremities: No edema  Neurological: Alert oriented x 4, grossly non-focal Psychological:  Alert and cooperative. Normal mood and affect  ASSESSMENT AND PLAN: *NASH cirrhosis:  Recent diagnosis and decompensation since knee replacement. Recent admission with large right pleural effusion/hepatic hydrothorax and ascites. Was on lasix 80 mg daily and spironolactone 200 mg daily at home. Then admitted with confusion, AKI, and dehydration. Ammonia level was 163. They had her on high doses of lactulose and she had a lot of diarrhea. Diuretics were held.  Lactulose decreased and ammonia is down to 63.  Cr improved.  MELD 18 during hospital stay.  Was discharged on lactulose, lower doses of diuretics, and nadolol 20 mg daily.  Xifaxan was added as well but has not received as outpatient yet since insurance has not approved.  -Continue lactulose to be titrated to 3-4 soft BM's daily. -Will send xifaxan to  [facility]. -Discontinue nadolol for now as we do not know that she has varices and her BP has been running low.  Had been on atenolol previously for PSVT. -Continue lasix 40 mg daily and spironolactone 100 mg daily.   -BMP today. -Referral to [facility] for transplant evaluation. -EGD with Dr. Carlean Purl for screening for esophageal varices. -Discontinue Viberzi, which she had been taking for her IBS.  CC:  Leamon Arnt, MD   Agree with Ms. Alphia Kava management.  BMET was NL  Gatha Mayer, MD, [facility]

## 2018-05-25 NOTE — Patient Instructions (Addendum)
We have sent your demographic information and a prescription for Xifaxan to [facility]. This pharmacy is able to get medication approved through insurance and get you the lowest copay possible. If you have not heard from them within 1 week, please call our office at [phone] to let us know.  Your provider has requested that you go to the basement level for lab work before leaving today. Press "B" on the elevator. The lab is located at the first door on the left as you exit the elevator.  We have sent the following medications to your pharmacy for you to pick up at your convenience: Lactulose  Stop Nadalol   Stop Viberzi   You have been scheduled for an endoscopy. Please follow written instructions given to you at your visit today. If you use inhalers (even only as needed), please bring them with you on the day of your procedure. Your physician has requested that you go to www.startemmi.com and enter the access code given to you at your visit today. This web site gives a general overview about your procedure. However, you should still follow specific instructions given to you by our office regarding your preparation for the procedure.

## 2018-05-26 ENCOUNTER — Telehealth: Payer: Self-pay | Admitting: Gastroenterology

## 2018-05-26 NOTE — Telephone Encounter (Signed)
Dawn Moon from [facility] states medication xifaxan requires a prior Josem Kaufmann and they are working on getting it approved, but they need chart notes faxed to [phone]

## 2018-05-26 NOTE — Telephone Encounter (Signed)
Notes sent to [facility]

## 2018-05-27 ENCOUNTER — Encounter: Payer: Self-pay | Admitting: Family Medicine

## 2018-05-27 ENCOUNTER — Encounter: Payer: Self-pay | Admitting: Gastroenterology

## 2018-05-27 ENCOUNTER — Encounter: Payer: Self-pay | Admitting: Internal Medicine

## 2018-05-27 ENCOUNTER — Other Ambulatory Visit: Payer: Self-pay

## 2018-05-27 ENCOUNTER — Ambulatory Visit (INDEPENDENT_AMBULATORY_CARE_PROVIDER_SITE_OTHER): Payer: 59 | Admitting: Family Medicine

## 2018-05-27 VITALS — BP 110/74 | HR 108 | Temp 98.0°F | Resp 16 | Ht 63.0 in | Wt 186.2 lb

## 2018-05-27 DIAGNOSIS — G4700 Insomnia, unspecified: Secondary | ICD-10-CM | POA: Diagnosis not present

## 2018-05-27 DIAGNOSIS — G4709 Other insomnia: Secondary | ICD-10-CM

## 2018-05-27 DIAGNOSIS — K7460 Unspecified cirrhosis of liver: Secondary | ICD-10-CM | POA: Diagnosis not present

## 2018-05-27 DIAGNOSIS — K7581 Nonalcoholic steatohepatitis (NASH): Secondary | ICD-10-CM | POA: Diagnosis not present

## 2018-05-27 MED ORDER — TRAZODONE HCL 50 MG PO TABS
25.0000 mg | ORAL_TABLET | Freq: Every evening | ORAL | 3 refills | Status: DC | PRN
Start: 2018-05-27 — End: 2018-09-20

## 2018-05-27 NOTE — Progress Notes (Signed)
ROI filled out and attached to paperwork for her upcoming EGD on 06/03/18. Will get signed that day and fax for all [facility] records.

## 2018-05-27 NOTE — Progress Notes (Signed)
Subjective  CC:  Chief Complaint  Patient presents with  . Hospitalization Follow-up    Patient states she is doing better, starting to have a little more strength, still having trouble sleeping    HPI: Dawn Moon is a 62 y.o. female who presents to the office today to address the problems listed above in the chief complaint. Recently released from 2nd hospitalization for ARF and hepatic encephalopathy due to over diuresis. I've personally reviewed recent office visit notes, hospital notes, associated labs and imaging reports and/or pertinent outside office records via chart review or CareEverywhere. Had f/u yesterday with GI. Stabilizing.    keeping daily weights. Down 4-5 pounds over last week.   Poor sleep at times: in part due to stress of recent illness and upcoming appt with transplant team. Tried vistaril in hospital w/o relief.  ROS: no cp or abominal pain or sob  Assessment  1. Liver cirrhosis secondary to NASH (nonalcoholic steatohepatitis) ([place])   2. Secondary insomnia      Plan   cirrhosis:  Compensated now; lactulose and diuretic dosing per GI; to check weights for the next 2 days; IF decreasing, to call GI to adjust down dose of diuretics. Mentation is normal on lactulose. F/u with [facility]  Insomnia: discussed behavioral mgt/stress reduction; trial of low dose trazodone.   Follow up: Return in about 3 months (around 08/27/2018) for complete physical.   No orders of the defined types were placed in this encounter.  Meds ordered this encounter  Medications  . traZODone (DESYREL) 50 MG tablet    Sig: Take 0.5-1 tablets (25-50 mg total) by mouth at bedtime as needed for sleep.    Dispense:  30 tablet    Refill:  3      I reviewed the patients updated PMH, FH, and SocHx.    Patient Active Problem List   Diagnosis Date Noted  . History of paroxysmal supraventricular tachycardia   . Liver cirrhosis secondary to NASH (nonalcoholic steatohepatitis)  ([place]) 04/23/2018  . Irritable bowel syndrome 06/15/2017  . Gastroesophageal reflux disease 06/15/2017  . Hyperlipidemia   . Fatty liver   . Kidney stone   . Essential hypertension 02/13/2010   Current Meds  Medication Sig  . Butalbital-APAP-Caffeine 50-300-40 MG CAPS Take 1 tablet by mouth every 8 (eight) hours as needed. (Patient taking differently: Take 1 tablet by mouth every 8 (eight) hours as needed (for pain.). )  . furosemide (LASIX) 40 MG tablet Take 1 tablet (40 mg total) by mouth daily.  Marland Kitchen HYDROcodone-acetaminophen (NORCO/VICODIN) 5-325 MG tablet Take 0.5-1 tablets by mouth every 6 (six) hours as needed for moderate pain.   . hydrOXYzine (ATARAX/VISTARIL) 25 MG tablet Take 1 tablet (25 mg total) by mouth 3 (three) times daily as needed for anxiety.  Marland Kitchen lactulose (CHRONULAC) 10 GM/15ML solution Take 30 mLs (20 g total) by mouth 2 (two) times daily.  . pantoprazole (PROTONIX) 40 MG tablet Take 1 tablet (40 mg total) by mouth every morning.  . promethazine (PHENERGAN) 25 MG tablet Take 1 tablet (25 mg total) by mouth every 6 (six) hours as needed for nausea or vomiting.  Marland Kitchen spironolactone (ALDACTONE) 100 MG tablet Take 1 tablet (100 mg total) by mouth daily.    Allergies: Patient has No Known Allergies. Family History: Patient family history includes Arthritis in her sister; Celiac disease in her daughter; Coronary artery disease in her father and mother; Diabetes in her father; Healthy in her brother; Hypertension in her sister;  IgA nephropathy in her daughter; Lymphoma in her mother. Social History:  Patient  reports that she has never smoked. She has never used smokeless tobacco. She reports that she drank alcohol. She reports that she does not use drugs.  Review of Systems: Constitutional: Negative for fever malaise or anorexia Cardiovascular: negative for chest pain Respiratory: negative for SOB or persistent cough Gastrointestinal: negative for abdominal pain  Objective    Vitals: BP 110/74   Pulse (!) 108   Temp 98 F (36.7 C) (Oral)   Resp 16   Ht 5' 3"  (1.6 m)   Wt 186 lb 3.2 oz (84.5 kg)   SpO2 98%   BMI 32.98 kg/m  General: no acute distress , A&Ox3 HEENT: PEERL, conjunctiva normal, Oropharynx moist,neck is supple Cardiovascular:  RRR without murmur or gallop.  Respiratory:  Good breath sounds bilaterally, CTAB with normal respiratory effort Skin:  Warm, no rashes     Commons side effects, risks, benefits, and alternatives for medications and treatment plan prescribed today were discussed, and the patient expressed understanding of the given instructions. Patient is instructed to call or message via MyChart if he/she has any questions or concerns regarding our treatment plan. No barriers to understanding were identified. We discussed Red Flag symptoms and signs in detail. Patient expressed understanding regarding what to do in case of urgent or emergency type symptoms.   Medication list was reconciled, printed and provided to the patient in AVS. Patient instructions and summary information was reviewed with the patient as documented in the AVS. This note was prepared with assistance of Dragon voice recognition software. Occasional wrong-word or sound-a-like substitutions may have occurred due to the inherent limitations of voice recognition software

## 2018-05-27 NOTE — Progress Notes (Signed)
My Chart message NL BMET

## 2018-05-27 NOTE — Patient Instructions (Signed)
Please return as schedule.  Keep an eye on your weights and call Dr. Celesta Aver office on Monday if your weight continues to decline.   If you have any questions or concerns, please don't hesitate to send me a message via MyChart or call the office at [phone]. Thank you for visiting with [place] today! It's our pleasure caring for you.

## 2018-05-27 NOTE — Progress Notes (Signed)
I need a records release form to have her sign at EGD 6/14   All records from [facility])  Please fill out and give to me so I can do that 6/14 (unless Jess had them do that 2 d ago)

## 2018-06-03 ENCOUNTER — Other Ambulatory Visit: Payer: Self-pay

## 2018-06-03 ENCOUNTER — Encounter: Payer: Self-pay | Admitting: Internal Medicine

## 2018-06-03 ENCOUNTER — Ambulatory Visit (AMBULATORY_SURGERY_CENTER): Payer: 59 | Admitting: Internal Medicine

## 2018-06-03 VITALS — BP 136/80 | HR 102 | Temp 97.8°F | Resp 15 | Ht 63.0 in | Wt 187.0 lb

## 2018-06-03 DIAGNOSIS — K7581 Nonalcoholic steatohepatitis (NASH): Secondary | ICD-10-CM

## 2018-06-03 DIAGNOSIS — K7460 Unspecified cirrhosis of liver: Secondary | ICD-10-CM

## 2018-06-03 DIAGNOSIS — I8510 Secondary esophageal varices without bleeding: Secondary | ICD-10-CM | POA: Diagnosis not present

## 2018-06-03 HISTORY — DX: Secondary esophageal varices without bleeding: I85.10

## 2018-06-03 MED ORDER — SODIUM CHLORIDE 0.9 % IV SOLN: 500.0000 mL | Freq: Once | INTRAVENOUS | Status: DC

## 2018-06-03 MED ORDER — LACTULOSE 10 GM/15ML PO SOLN: 10.0000 g | Freq: Two times a day (BID) | ORAL | 0 refills | Status: DC

## 2018-06-03 MED ORDER — NADOLOL 20 MG PO TABS: 10.0000 mg | ORAL_TABLET | Freq: Every day | ORAL | 0 refills | Status: DC

## 2018-06-03 NOTE — Patient Instructions (Addendum)
I did find varices - medium -size  Please restart Nadolol at 10 mg daily and try to go to 20 mg daily if tolerated.  Reduce lactulose to 15 cc 2 times a day  Systolic BP of 90 ok if you feel ok.  Let me have you do some labs July 1 and will see you soon after that (July 3)  We need to set up the appointment and will call you unless we tell you today.  I appreciate the opportunity to care for you. Gatha Mayer, MD, FACG  YOU HAD AN ENDOSCOPIC PROCEDURE TODAY AT [facility]:   Refer to the procedure report that was given to you for any specific questions about what was found during the examination.  If the procedure report does not answer your questions, please call your gastroenterologist to clarify.  If you requested that your care partner not be given the details of your procedure findings, then the procedure report has been included in a sealed envelope for you to review at your convenience later.  YOU SHOULD EXPECT: Some feelings of bloating in the abdomen. Passage of more gas than usual.  Walking can help get rid of the air that was put into your GI tract during the procedure and reduce the bloating. If you had a lower endoscopy (such as a colonoscopy or flexible sigmoidoscopy) you may notice spotting of blood in your stool or on the toilet paper. If you underwent a bowel prep for your procedure, you may not have a normal bowel movement for a few days.  Please Note:  You might notice some irritation and congestion in your nose or some drainage.  This is from the oxygen used during your procedure.  There is no need for concern and it should clear up in a day or so.  SYMPTOMS TO REPORT IMMEDIATELY:   Following upper endoscopy (EGD)  Vomiting of blood or coffee ground material  New chest pain or pain under the shoulder blades  Painful or persistently difficult swallowing  New shortness of breath  Fever of 100F or higher  Black, tarry-looking stools  For  urgent or emergent issues, a gastroenterologist can be reached at any hour by calling [phone].   DIET:  We do recommend a small meal at first, but then you may proceed to your regular diet.  Drink plenty of fluids but you should avoid alcoholic beverages for 24 hours.  ACTIVITY:  You should plan to take it easy for the rest of today and you should NOT DRIVE or use heavy machinery until tomorrow (because of the sedation medicines used during the test).    FOLLOW UP: Our staff will call the number listed on your records the next business day following your procedure to check on you and address any questions or concerns that you may have regarding the information given to you following your procedure. If we do not reach you, we will leave a message.  However, if you are feeling well and you are not experiencing any problems, there is no need to return our call.  We will assume that you have returned to your regular daily activities without incident.  If any biopsies were taken you will be contacted by phone or by letter within the next 1-3 weeks.  Please call us at [phone] if you have not heard about the biopsies in 3 weeks.    SIGNATURES/CONFIDENTIALITY: You and/or your care partner have signed paperwork which will be  entered into your electronic medical record.  These signatures attest to the fact that that the information above on your After Visit Summary has been reviewed and is understood.  Full responsibility of the confidentiality of this discharge information lies with you and/or your care-partner.

## 2018-06-03 NOTE — Progress Notes (Signed)
Report to PACU, RN, vss, BBS= Clear.  

## 2018-06-03 NOTE — Op Note (Signed)
[facility] Patient Name: Dawn Moon Procedure Date: 06/03/2018 1:58 PM MRN: [id number] Endoscopist: Gatha Mayer , MD Age: 62 Referring MD:  Date of Birth: May 25, 1956 Gender: Female Account #: [id number] Procedure:                Upper GI endoscopy Indications:              Cirrhosis rule out esophageal varices Medicines:                Propofol per Anesthesia, Monitored Anesthesia Care Procedure:                Pre-Anesthesia Assessment:                           - Prior to the procedure, a History and Physical                            was performed, and patient medications and                            allergies were reviewed. The patient's tolerance of                            previous anesthesia was also reviewed. The risks                            and benefits of the procedure and the sedation                            options and risks were discussed with the patient.                            All questions were answered, and informed consent                            was obtained. Prior Anticoagulants: The patient has                            taken no previous anticoagulant or antiplatelet                            agents. ASA Grade Assessment: III - A patient with                            severe systemic disease. After reviewing the risks                            and benefits, the patient was deemed in                            satisfactory condition to undergo the procedure.                           After obtaining informed consent, the endoscope was  passed under direct vision. Throughout the                            procedure, the patient's blood pressure, pulse, and                            oxygen saturations were monitored continuously. The                            Model GIF-HQ190 [id number]) scope was introduced                            through the mouth, and advanced to the second part                of duodenum. The upper GI endoscopy was                            accomplished without difficulty. The patient                            tolerated the procedure well. Scope In: Scope Out: Findings:                 Grade II, medium-large (> 5 mm) varices were found                            in the middle third of the esophagus and in the                            lower third of the esophagus. They were 6 mm in                            largest diameter. Estimated blood loss: none.                           The exam was otherwise without abnormality.                           The cardia and gastric fundus were normal on                            retroflexion. Complications:            No immediate complications. Estimated Blood Loss:     Estimated blood loss: none. Impression:               - Grade II and medium-large (> 5 mm) esophageal                            varices. No stigmata of bleeding                           - The examination was otherwise normal.                           - No  specimens collected. Recommendation:           - Patient has a contact number available for                            emergencies. The signs and symptoms of potential                            delayed complications were discussed with the                            patient. Return to normal activities tomorrow.                            Written discharge instructions were provided to the                            patient.                           - Resume previous diet.                           - Continue present medications.                           - resume Nadolol at 10 mg and go to 20 mg if                            tolerated.                           SBP 90 ok                           reduce lactulose to 15 cc bid                           Labs on 7/1 - CBC, CMET, NH3                           See me in office 7/3 may use hemorrhoid banding appt                            - Repeat upper endoscopy in 1 year for surveillance. Gatha Mayer, MD 06/03/2018 2:26:05 PM This report has been signed electronically.

## 2018-06-06 ENCOUNTER — Telehealth: Payer: Self-pay

## 2018-06-06 ENCOUNTER — Telehealth: Payer: Self-pay | Admitting: *Deleted

## 2018-06-06 DIAGNOSIS — Z23 Encounter for immunization: Secondary | ICD-10-CM

## 2018-06-06 DIAGNOSIS — K7581 Nonalcoholic steatohepatitis (NASH): Principal | ICD-10-CM

## 2018-06-06 DIAGNOSIS — K7460 Unspecified cirrhosis of liver: Secondary | ICD-10-CM

## 2018-06-06 NOTE — Telephone Encounter (Signed)
  Follow up Call-  Call back number 06/03/2018  Post procedure Call Back phone  # [phone]  Permission to leave phone message Yes  Some recent data might be hidden     Patient questions:  Message left to call us if necessary.  Second call.

## 2018-06-06 NOTE — Telephone Encounter (Signed)
-----   Message from Gatha Mayer, MD sent at 06/03/2018  4:33 PM EDT ----- Regarding: vaccines Needs Hep A vaccine Is immune to HBV  We have requested her [facility] records (via [facility]) today  I do not see any hx of pneumonia vaccines   If she has not had Prevnar -13 go ahead and do that  If she has not had pneumovax or > 5 yrs ago  Will do 8 weeks after Prevnar or now if she has had Prevnar

## 2018-06-06 NOTE — Telephone Encounter (Signed)
Left message on answering machine. 

## 2018-06-06 NOTE — Telephone Encounter (Signed)
She believes she had a pneumovax but not sure which one.  She has an OV on 06/20/18 and will get Prevnar 13 and hep A #1 then,

## 2018-06-10 ENCOUNTER — Encounter: Payer: Self-pay | Admitting: Internal Medicine

## 2018-06-10 ENCOUNTER — Ambulatory Visit: Payer: 59 | Admitting: Family

## 2018-06-14 ENCOUNTER — Telehealth: Payer: Self-pay | Admitting: Internal Medicine

## 2018-06-14 NOTE — Telephone Encounter (Signed)
ROI faxed to [facility] for records.

## 2018-06-16 ENCOUNTER — Other Ambulatory Visit: Payer: Self-pay | Admitting: Family Medicine

## 2018-06-16 ENCOUNTER — Encounter: Payer: Self-pay | Admitting: Internal Medicine

## 2018-06-16 DIAGNOSIS — Z1231 Encounter for screening mammogram for malignant neoplasm of breast: Secondary | ICD-10-CM

## 2018-06-17 ENCOUNTER — Other Ambulatory Visit: Payer: Self-pay | Admitting: Family

## 2018-06-17 DIAGNOSIS — K580 Irritable bowel syndrome with diarrhea: Secondary | ICD-10-CM

## 2018-06-20 ENCOUNTER — Other Ambulatory Visit (INDEPENDENT_AMBULATORY_CARE_PROVIDER_SITE_OTHER): Payer: 59

## 2018-06-20 DIAGNOSIS — I8510 Secondary esophageal varices without bleeding: Secondary | ICD-10-CM | POA: Diagnosis not present

## 2018-06-20 LAB — CBC WITH DIFFERENTIAL/PLATELET
BASOS ABS: 0.1 10*3/uL (ref 0.0–0.1)
Basophils Relative: 1.4 % (ref 0.0–3.0)
EOS ABS: 0.2 10*3/uL (ref 0.0–0.7)
Eosinophils Relative: 2.9 % (ref 0.0–5.0)
HCT: 38.3 % (ref 36.0–46.0)
Hemoglobin: 12.8 g/dL (ref 12.0–15.0)
LYMPHS ABS: 1.7 10*3/uL (ref 0.7–4.0)
Lymphocytes Relative: 31.8 % (ref 12.0–46.0)
MCHC: 33.5 g/dL (ref 30.0–36.0)
MCV: 85.9 fl (ref 78.0–100.0)
MONO ABS: 0.5 10*3/uL (ref 0.1–1.0)
Monocytes Relative: 9.9 % (ref 3.0–12.0)
NEUTROS PCT: 54 % (ref 43.0–77.0)
Neutro Abs: 2.9 10*3/uL (ref 1.4–7.7)
PLATELETS: 144 10*3/uL — AB (ref 150.0–400.0)
RBC: 4.45 Mil/uL (ref 3.87–5.11)
RDW: 15.4 % (ref 11.5–15.5)
WBC: 5.4 10*3/uL (ref 4.0–10.5)

## 2018-06-20 LAB — COMPREHENSIVE METABOLIC PANEL
ALK PHOS: 90 U/L (ref 39–117)
ALT: 25 U/L (ref 0–35)
AST: 42 U/L — ABNORMAL HIGH (ref 0–37)
Albumin: 3.3 g/dL — ABNORMAL LOW (ref 3.5–5.2)
BILIRUBIN TOTAL: 1.4 mg/dL — AB (ref 0.2–1.2)
BUN: 14 mg/dL (ref 6–23)
CALCIUM: 9.4 mg/dL (ref 8.4–10.5)
CO2: 27 mEq/L (ref 19–32)
CREATININE: 1.02 mg/dL (ref 0.40–1.20)
Chloride: 103 mEq/L (ref 96–112)
GFR: 58.4 mL/min — AB (ref >60.00)
GLUCOSE: 114 mg/dL — AB (ref 70–99)
POTASSIUM: 4.5 meq/L (ref 3.5–5.1)
Sodium: 137 mEq/L (ref 135–145)
TOTAL PROTEIN: 6.8 g/dL (ref 6.0–8.3)

## 2018-06-20 LAB — AMMONIA: AMMONIA: 88 umol/L — AB (ref 11–35)

## 2018-06-22 ENCOUNTER — Ambulatory Visit (INDEPENDENT_AMBULATORY_CARE_PROVIDER_SITE_OTHER)
Admission: RE | Admit: 2018-06-22 | Discharge: 2018-06-22 | Disposition: A | Discharge status: Home / Self Care | Payer: 59 | Source: Ambulatory Visit | Attending: Internal Medicine | Admitting: Internal Medicine

## 2018-06-22 ENCOUNTER — Ambulatory Visit
Admission: RE | Admit: 2018-06-22 | Discharge: 2018-06-22 | Disposition: A | Discharge status: Home / Self Care | Payer: 59 | Source: Ambulatory Visit | Attending: Internal Medicine | Admitting: Internal Medicine

## 2018-06-22 ENCOUNTER — Ambulatory Visit (INDEPENDENT_AMBULATORY_CARE_PROVIDER_SITE_OTHER): Payer: 59 | Admitting: Internal Medicine

## 2018-06-22 ENCOUNTER — Encounter: Payer: Self-pay | Admitting: Internal Medicine

## 2018-06-22 VITALS — BP 98/62 | HR 74 | Ht 63.5 in | Wt 180.4 lb

## 2018-06-22 DIAGNOSIS — R109 Unspecified abdominal pain: Secondary | ICD-10-CM

## 2018-06-22 DIAGNOSIS — J90 Pleural effusion, not elsewhere classified: Secondary | ICD-10-CM

## 2018-06-22 DIAGNOSIS — K7290 Hepatic failure, unspecified without coma: Secondary | ICD-10-CM

## 2018-06-22 DIAGNOSIS — K7581 Nonalcoholic steatohepatitis (NASH): Secondary | ICD-10-CM

## 2018-06-22 DIAGNOSIS — Z23 Encounter for immunization: Secondary | ICD-10-CM

## 2018-06-22 DIAGNOSIS — K7682 Hepatic encephalopathy: Secondary | ICD-10-CM

## 2018-06-22 DIAGNOSIS — K580 Irritable bowel syndrome with diarrhea: Secondary | ICD-10-CM

## 2018-06-22 DIAGNOSIS — K7460 Unspecified cirrhosis of liver: Secondary | ICD-10-CM | POA: Diagnosis not present

## 2018-06-22 MED ORDER — ALOSETRON HCL 0.5 MG PO TABS: 0.5000 mg | ORAL_TABLET | Freq: Two times a day (BID) | ORAL | 3 refills | Status: DC

## 2018-06-22 MED ORDER — LACTULOSE 10 GM/15ML PO SOLN: 10.0000 g | Freq: Two times a day (BID) | ORAL | 6 refills | Status: DC

## 2018-06-22 NOTE — Patient Instructions (Addendum)
Good luck with your [facility] visit.  Please go to the x-ray department before leaving today.    We have sent the following medications to your pharmacy for you to pick up at your convenience: Alosetron If this medicine constipates you please stop it and call us.   Today you have been given a Hepatitis A vaccine . You will need your next one in 12/2018. We will call you to set this appointment up when the time gets closer.  You have been given a Prevnar 13 vaccine today as well.   Follow up with Dr Carlean Purl on September 9th at 4:00pm  I appreciate the opportunity to care for you. Silvano Rusk, MD, [facility]

## 2018-06-22 NOTE — Progress Notes (Signed)
Dawn Moon 62 y.o. 08/16/1956 [id number]  Assessment & Plan:   Encounter Diagnoses  Name Primary?  . Liver cirrhosis secondary to NASH ([place]) Yes  . Right flank pain   . Encephalopathy, hepatic ([place])   . Irritable bowel syndrome with diarrhea   . Pleural effusion on right   . Need for prophylactic vaccination and inoculation against viral hepatitis   . Need for prophylactic vaccination against Streptococcus pneumoniae (pneumococcus)      HAV vaccination today  Prevnar 13 vaccination today and we will do a pneumococcal when she returns in September.   I have asked her records of her previous work-up at [facility] gastroenterology but still not not received them. She is a knowledgeable patient and was confident of a diagnosis of fatty liver but I do not know what serologies etc. she has had to date.  I will defer to [facility] for the serologic work-up.  Back to work next week.  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  TL spine films PA lat CXR These x-rays will evaluate her right sided pain Salon pause patch or point meant or Tylenol up to 2 g daily as recommended.  Minimize/avoid NSAIDs.  These are on her list but not really taking.   Alosetron is prescribed to treat her IBS diarrhea predominant.  After she left I realized that this is contraindicated in child see patients but she has improved from a child's C to a child's B since hospitalization so either Viberzi or alosetron are acceptable treatments based upon prescribing guidelines.  Her diarrhea is a particular problem in conjunction with necessary lactulose..  Consider going back to Viberzi at a lower dose.  Alosetron is going to require a prior authorization which we can do.  In theory since it does not interfere or have the potential to interfere with the sphincter of Oddi I would prefer this 1. Stop if cramps/pain  Continue current therapy otherwise.  She does not appear encephalopathic today  though her ammonia runs in the 80s.  I suspect she is not completely compliant with lactulose given the diarrhea issues.  Overall she is significantly better from where she was with 2 hospitalizations related to her right pleural effusion and then hepatic encephalopathy that I must presume were triggered by the stress of total knee replacement.  I appreciate the opportunity to care for this patient. OA:CZYS, Karie Fetch, MD  Subjective:   Chief Complaint: Follow-up of cirrhosis  HPI Dawn Moon is here with her sister, she feels stable to improved.  Her main complaint today is a right flank soreness and pain in the back and lateral area.  She also has diarrhea issues.  Worse with the lactulose.  She was graded out as a child see when she was hospitalized the second time with encephalopathy and fluid retention and we stopped her Viberzi because of that.  She is trying to take her lactulose and her Xifaxan.  She does not note any confusion or excessive sleepiness.  She anticipates going back to work next week.  She is still recovering from her right total knee but says that is going better and the flexibility etc. is improved.  I did find grade 2 esophageal varices at EGD recently.  No stigmata of bleeding.  She was empirically started on nadolol in the hospital and remains on that low dose.  She has had problems with we will proceed to be hypotension though her blood pressure was in the 06T systolic  and I had explained that is very common and liver disease to run that way.  Wt Readings from Last 3 Encounters:  06/22/18 180 lb 6 oz (81.8 kg)  06/03/18 187 lb (84.8 kg)  05/27/18 186 lb 3.2 oz (84.5 kg)   Labs this week prior to the visit notable for platelets 144, bilirubin 1.4 total AST 42, albumin 3.3 which is improved, ammonia 88 which is similar to previous,   No Known Allergies Current Meds  Medication Sig  . Butalbital-APAP-Caffeine 50-300-40 MG CAPS Take 1 tablet by mouth every 8 (eight) hours  as needed.  . furosemide (LASIX) 40 MG tablet Take 1 tablet (40 mg total) by mouth daily.  Marland Kitchen HYDROcodone-acetaminophen (NORCO/VICODIN) 5-325 MG tablet Take 0.5-1 tablets by mouth every 6 (six) hours as needed for moderate pain.   . hydrOXYzine (ATARAX/VISTARIL) 25 MG tablet Take 1 tablet (25 mg total) by mouth 3 (three) times daily as needed for anxiety.  Marland Kitchen ibuprofen (ADVIL,MOTRIN) 200 MG tablet Take 200 mg by mouth as needed.  . lactulose (CHRONULAC) 10 GM/15ML solution Take 15 mLs (10 g total) by mouth 2 (two) times daily.  . nadolol (CORGARD) 20 MG tablet Take 0.5 tablets (10 mg total) by mouth daily. If tolerated go back to 20 mg daily  . pantoprazole (PROTONIX) 40 MG tablet Take 1 tablet (40 mg total) by mouth every morning.  . promethazine (PHENERGAN) 25 MG tablet Take 1 tablet (25 mg total) by mouth every 6 (six) hours as needed for nausea or vomiting.  . rifaximin (XIFAXAN) 550 MG TABS tablet Take 1 tablet (550 mg total) by mouth 2 (two) times daily.  Marland Kitchen spironolactone (ALDACTONE) 100 MG tablet Take 1 tablet (100 mg total) by mouth daily.  . traZODone (DESYREL) 50 MG tablet Take 0.5-1 tablets (25-50 mg total) by mouth at bedtime as needed for sleep.  . [DISCONTINUED] lactulose (CHRONULAC) 10 GM/15ML solution Take 15 mLs (10 g total) by mouth 2 (two) times daily.   Past Medical History:  Diagnosis Date  . Ascites   . Encephalopathy, hepatic ([place]) 05/13/2018  . GERD (gastroesophageal reflux disease)   . History of exercise stress test    03-07-2010  Stress echo--- no arrhythmias or conduction abnormalilites and negative for ischemia or chest pain  . History of kidney stones   . History of paroxysmal supraventricular tachycardia    episode 2011  consult w/ dr Caryl Comes --  put on atenolol--  per pt no longer an issue  . HTN (hypertension)    cardiologist --  dr Stanford Breed  . IBS (irritable bowel syndrome)    diarrhea  . Nonalcoholic steatohepatitis ([place])   . Numbness and tingling of left  lower extremity    post achilles tendon repair  . OA (osteoarthritis)    knees  . Pleural effusion on right    hepatic hydrothorax  . PONV (postoperative nausea and vomiting)   . Secondary esophageal varices without bleeding ([place]) 06/03/2018  . Vitamin D deficiency   . Wears glasses    Past Surgical History:  Procedure Laterality Date  . ACHILLES TENDON SURGERY Left 06/2016  . BREAST BIOPSY Left 02/2016   benign  . COLONOSCOPY  2005  . CT CTA CORONARY W/CA SCORE W/CM &/OR WO/CM  01/01/2014   non-obstructive calcified plaque in pLAD (0-25%), no significant incidental noncardiac findings noted  . ESOPHAGOGASTRODUODENOSCOPY  01/2014  . EXTRACORPOREAL SHOCK WAVE LITHOTRIPSY  2010  . KNEE ARTHROPLASTY Right 03/24/2018   Procedure: RIGHT TOTAL KNEE ARTHROPLASTY  WITH COMPUTER NAVIGATION;  Surgeon: Rod Can, MD;  Location: [facility];  Service: Orthopedics;  Laterality: Right;  Needs RNFA  . KNEE ARTHROSCOPY Right 12/04/2016   Procedure: ARTHROSCOPY KNEE WITH PARTIAL MEDIAL MENISCECTOMY;  Surgeon: Rod Can, MD;  Location: [place];  Service: Orthopedics;  Laterality: Right;  . LEFT HEART CATHETERIZATION WITH CORONARY ANGIOGRAM N/A 01/11/2012   Procedure: LEFT HEART CATHETERIZATION WITH CORONARY ANGIOGRAM;  Surgeon: Sherren Mocha, MD;  Location: [facility] CATH LAB;  Service: Cardiovascular;  Laterality: N/A;  widely patent coronary arterires without significant obstructive CAD,  normal LVF, ef 55-56%  . TONSILLECTOMY AND ADENOIDECTOMY  child  . TRANSTHORACIC ECHOCARDIOGRAM  01/18/2012   mild LVH,  ef 60%/  trivial TR  . 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  Has to walk up stairs to work and can do w/o Ss.  Lives @ home in [place] with family.   family history includes  Arthritis in her sister; Celiac disease in her daughter; Coronary artery disease in her father and mother; Diabetes in her father; Healthy in her brother; Hypertension in her sister; IgA nephropathy in her daughter; Lymphoma in her mother.   Review of Systems See HPI  Objective:   Physical Exam BP 98/62   Pulse 74   Ht 5' 3.5" (1.613 m)   Wt 180 lb 6 oz (81.8 kg)   BMI 31.45 kg/m  NAD Anicteric Alert and orented x 3 and no asterixis and ok serial 7 Lungs cta Cor NL abd obese no HSM  Ext tr R edema L ok  R flank soft tissues tender but not bones

## 2018-06-24 ENCOUNTER — Other Ambulatory Visit: Payer: Self-pay

## 2018-06-24 ENCOUNTER — Encounter: Payer: Self-pay | Admitting: Internal Medicine

## 2018-06-24 ENCOUNTER — Telehealth: Payer: Self-pay | Admitting: Gastroenterology

## 2018-06-24 DIAGNOSIS — K2100 Gastro-esophageal reflux disease with esophagitis, without bleeding: Secondary | ICD-10-CM

## 2018-06-24 MED ORDER — ELUXADOLINE 75 MG PO TABS: 75.0000 mg | ORAL_TABLET | Freq: Every day | ORAL | 0 refills | Status: DC

## 2018-06-24 MED ORDER — PANTOPRAZOLE SODIUM 40 MG PO TBEC: 40.0000 mg | DELAYED_RELEASE_TABLET | Freq: Every morning | ORAL | 0 refills | Status: DC

## 2018-06-24 NOTE — Telephone Encounter (Signed)
This patient sent a portal message requesting Viberzi refill.  Somehow the message was routed to me.

## 2018-06-24 NOTE — Telephone Encounter (Signed)
See Dawn Moon's note. Already addressed.

## 2018-06-24 NOTE — Progress Notes (Signed)
Reviewed in clinic but sending My Chart message re: alosetron

## 2018-06-24 NOTE — Progress Notes (Signed)
CXR ok

## 2018-06-24 NOTE — Telephone Encounter (Signed)
Refill request

## 2018-06-27 ENCOUNTER — Other Ambulatory Visit: Payer: Self-pay | Admitting: Internal Medicine

## 2018-06-27 MED ORDER — ELUXADOLINE 75 MG PO TABS: 75.0000 mg | ORAL_TABLET | Freq: Two times a day (BID) | ORAL | 3 refills | Status: DC

## 2018-06-27 NOTE — Progress Notes (Signed)
Viberzi 75 mg bid sent to pharmacy  Correct dose  She has Child's B cirrhosis

## 2018-06-29 ENCOUNTER — Telehealth: Payer: Self-pay

## 2018-06-29 MED ORDER — FUROSEMIDE 40 MG PO TABS: 40.0000 mg | ORAL_TABLET | Freq: Every day | ORAL | 0 refills | Status: DC

## 2018-06-29 MED ORDER — NADOLOL 20 MG PO TABS: 10.0000 mg | ORAL_TABLET | Freq: Every day | ORAL | 0 refills | Status: DC

## 2018-06-29 NOTE — Telephone Encounter (Signed)
Refill x 90 days for both

## 2018-06-29 NOTE — Telephone Encounter (Signed)
Pharmacy sent refill request for patient's furosemide and nadolol, please advise Sir? Thank you.

## 2018-06-29 NOTE — Telephone Encounter (Signed)
Nadolol and Furosemide refilled as approved.

## 2018-06-30 MED ORDER — NADOLOL 20 MG PO TABS: 10.0000 mg | ORAL_TABLET | Freq: Every day | ORAL | 0 refills | Status: DC

## 2018-06-30 NOTE — Addendum Note (Signed)
Addended by: Larina Bras on: 06/30/2018 02:18 PM   Modules accepted: Orders

## 2018-07-04 ENCOUNTER — Ambulatory Visit: Payer: 59 | Admitting: Internal Medicine

## 2018-07-05 ENCOUNTER — Encounter: Payer: Self-pay | Admitting: Internal Medicine

## 2018-07-06 ENCOUNTER — Telehealth: Payer: Self-pay | Admitting: Internal Medicine

## 2018-07-06 ENCOUNTER — Ambulatory Visit: Payer: 59

## 2018-07-06 NOTE — Telephone Encounter (Signed)
Rec'd records from [facility] forwarded 22 pages to Dr.Gessner Glendell Docker

## 2018-07-18 ENCOUNTER — Telehealth: Payer: Self-pay

## 2018-07-18 NOTE — Telephone Encounter (Signed)
Spoke with Dawn Moon and told her we got her [facility] records and will fax them to [facility]--ATT: Dr Enis Gash, phone # [phone]. Will call tomorrow and get fax #. She said she saw them 07/13/18 and they did labs and ordered imaging studies. She sees them again in November and comes in September to see [place]. She wants to know if Dr Carlean Purl can do FMLA for her. I will route to him and call her back. She's aware he is out this week.

## 2018-07-19 NOTE — Telephone Encounter (Signed)
FYI- Dr Enis Gash fax # is [phone].

## 2018-07-20 NOTE — Telephone Encounter (Signed)
Patient informed and said thank you. She is aware of the [facility] office location.

## 2018-07-20 NOTE — Progress Notes (Signed)
HPI: FU hypertension and volume excess. CardioNet 2011 showed 4 beats of nonsustained ventricular tachycardia; also brief atrial tachycardia; note some of symptoms also noted to be sinus tachycardia. Patient was seen by Dr. Caryl Comes and started on verapamil. Her symptoms did not improve and she was changed to a beta blocker. Her symptoms improved with atenolol. Cardiac catheterization 1/13 revealed no obstructive coronary artery disease. Her ejection fraction was 55-65%. Cardiac CT January 2015 showed nonobstructive plaque in the proximal LAD.  Patient admitted April 2019 and underwent right knee replacement.  Following discharge she developed worsening dyspnea.  Echocardiogram May 2019 showed normal LV systolic function and mild left atrial enlargement. CTA May 2019 showed no pulmonary embolus.  There was a large right pleural effusion and lobulated liver compatible with cirrhosis.  There was ascites.  Patient underwent thoracentesis and paracentesis.  She was also diuresed. She was diagnosed with cirrhosis due to NASH. She was discharged on higher dose diuretics.  Patient then readmitted with confusion and diagnosed with hepatic encephalopathy and acute renal insufficiency.  Patient was treated with lactulose and xifaxan.  Diuretics were reduced.  She has been followed by gastroenterology as an outpatient.  She was referred to [facility] for consideration of transplant.  Note she did have 2 esophageal varices by EGD June 2019.  Since last seen, she denies dyspnea, chest pain or syncope.  She has palpitations described as a brief flutter approximately twice weekly.  She has some fatigue.  Current Outpatient Medications  Medication Sig Dispense Refill  . Butalbital-APAP-Caffeine 50-300-40 MG CAPS Take 1 tablet by mouth every 8 (eight) hours as needed. 84 capsule 2  . Eluxadoline (VIBERZI) 75 MG TABS Take 75 mg by mouth 2 (two) times daily. (Patient taking differently: Take 75 mg by mouth as needed. ) 180  tablet 3  . furosemide (LASIX) 40 MG tablet Take 1 tablet (40 mg total) by mouth daily. 90 tablet 0  . hydrOXYzine (ATARAX/VISTARIL) 25 MG tablet Take 1 tablet (25 mg total) by mouth 3 (three) times daily as needed for anxiety. 60 tablet 2  . lactulose (CHRONULAC) 10 GM/15ML solution Take 15 mLs (10 g total) by mouth 2 (two) times daily. 240 mL 6  . nadolol (CORGARD) 20 MG tablet Take 0.5 tablets (10 mg total) by mouth daily. If tolerated go back to 20 mg daily 90 tablet 0  . pantoprazole (PROTONIX) 40 MG tablet Take 1 tablet (40 mg total) by mouth every morning. 30 tablet 0  . promethazine (PHENERGAN) 25 MG tablet Take 1 tablet (25 mg total) by mouth every 6 (six) hours as needed for nausea or vomiting. 20 tablet 0  . rifaximin (XIFAXAN) 550 MG TABS tablet Take 1 tablet (550 mg total) by mouth 2 (two) times daily. 60 tablet 5  . spironolactone (ALDACTONE) 100 MG tablet TAKE 1 TABLET DAILY 30 tablet 0  . traZODone (DESYREL) 50 MG tablet Take 0.5-1 tablets (25-50 mg total) by mouth at bedtime as needed for sleep. 30 tablet 3  . HYDROcodone-acetaminophen (NORCO/VICODIN) 5-325 MG tablet Take 0.5-1 tablets by mouth every 6 (six) hours as needed for moderate pain.     Marland Kitchen ibuprofen (ADVIL,MOTRIN) 200 MG tablet Take 200 mg by mouth as needed.     No current facility-administered medications for this visit.      Past Medical History:  Diagnosis Date  . Ascites   . Encephalopathy, hepatic ([place]) 05/13/2018  . GERD (gastroesophageal reflux disease)   . History of exercise  stress test    03-07-2010  Stress echo--- no arrhythmias or conduction abnormalilites and negative for ischemia or chest pain  . History of kidney stones   . History of paroxysmal supraventricular tachycardia    episode 2011  consult w/ dr Caryl Comes --  put on atenolol--  per pt no longer an issue  . HTN (hypertension)    cardiologist --  dr Stanford Breed  . IBS (irritable bowel syndrome)    diarrhea  . Nonalcoholic steatohepatitis (NASH)    . Numbness and tingling of left lower extremity    post achilles tendon repair  . OA (osteoarthritis)    knees  . Pleural effusion on right    hepatic hydrothorax  . PONV (postoperative nausea and vomiting)   . Secondary esophageal varices without bleeding ([place]) 06/03/2018  . Vitamin D deficiency   . Wears glasses     Past Surgical History:  Procedure Laterality Date  . ACHILLES TENDON SURGERY Left 06/2016  . BREAST BIOPSY Left 02/2016   benign  . COLONOSCOPY  2005  . CT CTA CORONARY W/CA SCORE W/CM &/OR WO/CM  01/01/2014   non-obstructive calcified plaque in pLAD (0-25%), no significant incidental noncardiac findings noted  . ESOPHAGOGASTRODUODENOSCOPY  01/2014  . EXTRACORPOREAL SHOCK WAVE LITHOTRIPSY  2010  . KNEE ARTHROPLASTY Right 03/24/2018   Procedure: RIGHT TOTAL KNEE ARTHROPLASTY WITH COMPUTER NAVIGATION;  Surgeon: Rod Can, MD;  Location: [facility];  Service: Orthopedics;  Laterality: Right;  Needs RNFA  . KNEE ARTHROSCOPY Right 12/04/2016   Procedure: ARTHROSCOPY KNEE WITH PARTIAL MEDIAL MENISCECTOMY;  Surgeon: Rod Can, MD;  Location: [facility];  Service: Orthopedics;  Laterality: Right;  . LEFT HEART CATHETERIZATION WITH CORONARY ANGIOGRAM N/A 01/11/2012   Procedure: LEFT HEART CATHETERIZATION WITH CORONARY ANGIOGRAM;  Surgeon: Sherren Mocha, MD;  Location: [facility] CATH LAB;  Service: Cardiovascular;  Laterality: N/A;  widely patent coronary arterires without significant obstructive CAD,  normal LVF, ef 55-56%  . TONSILLECTOMY AND ADENOIDECTOMY  child  . TRANSTHORACIC ECHOCARDIOGRAM  01/18/2012   mild LVH,  ef 60%/  trivial TR  . TUBAL LIGATION  1985    Social History   Socioeconomic History  . Marital status: Married    Spouse name: Not on file  . Number of children: Not on file  . Years of education: Not on file  . Highest education level: Not on file  Occupational History  . 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 Financial resource strain: Not on file  . Food insecurity:    Worry: Not on file    Inability: Not on file  . Transportation needs:    Medical: Not on file    Non-medical: Not on file  Tobacco Use  . Smoking status: Never Smoker  . Smokeless tobacco: Never Used  Substance and Sexual Activity  . Alcohol use: Not Currently  . Drug use: No  . Sexual activity: Not on file  Lifestyle  . Physical activity:    Days per week: Not on file    Minutes per session: Not on file  . Stress: Not on file  Relationships  . Social connections:    Talks on phone: Not on file    Gets together: Not on file    Attends religious service: Not on file    Active member of club or organization: Not on file    Attends meetings of clubs or organizations: Not on file    Relationship  status: Not on file  . Intimate partner violence:    Fear of current or ex partner: Not on file    Emotionally abused: Not on file    Physically abused: Not on file    Forced sexual activity: Not on file  Other Topics Concern  . 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  Has to walk up stairs to work and can do w/o Ss.  Lives @ home in [place] with family.    Family History  Problem Relation Age of Onset  . Lymphoma Mother        non-hodgkins - died @ 67  . Coronary artery disease Mother   . Coronary artery disease Father        CABG in his 35s, died @ 65  . Diabetes Father   . Healthy Brother   . Hypertension Sister   . Arthritis Sister   . IgA nephropathy Daughter   . Celiac disease Daughter     ROS: Cold intolerance and fatigue but no fevers or chills, productive cough, hemoptysis, dysphasia, odynophagia, melena, hematochezia, dysuria, hematuria, rash, seizure activity, orthopnea, PND, pedal edema, claudication.  Remaining systems are negative.  Physical Exam: Well-developed well-nourished in no acute distress.  Skin is warm and dry.  HEENT is normal.  Neck is supple.  Chest is clear to auscultation with normal expansion.  Cardiovascular exam is regular rate and rhythm.  Abdominal exam nontender or distended. No masses palpated. Extremities show no edema. neuro grossly intact  A/P  1 hypertension-blood pressure is controlled.  Continue present medications.  2 history of palpitations and nonsustained ventricular tachycardia-continue nadolol.  I cannot advance dose as blood pressure is borderline.  She also states it runs low at home.  3 volume excess-related to cirrhosis.  Continue present dose of diuretics.  She had laboratories drawn recently at [facility] in late July.  These were personally reviewed and potassium was 4.1 with creatinine 1.3.  4 cirrhosis-due to NASH; followed by gastroenterology.  She has been referred to [facility] for consideration of transplant.  Kirk Ruths, MD

## 2018-07-20 NOTE — Telephone Encounter (Signed)
Absolutely will do FMLA

## 2018-07-22 ENCOUNTER — Other Ambulatory Visit: Payer: Self-pay | Admitting: Emergency Medicine

## 2018-07-22 ENCOUNTER — Other Ambulatory Visit: Payer: Self-pay | Admitting: Gastroenterology

## 2018-07-22 MED ORDER — HYDROXYZINE HCL 25 MG PO TABS: 25.0000 mg | ORAL_TABLET | Freq: Three times a day (TID) | ORAL | 2 refills | Status: DC | PRN

## 2018-07-22 NOTE — Telephone Encounter (Signed)
Requesting refill for Hydrpxyzine HCL 18m. Last refilled 04/22/2018 Last prescribed by Dr. TGrandville Silosok to refill?

## 2018-07-26 ENCOUNTER — Encounter: Payer: Self-pay | Admitting: Cardiology

## 2018-07-26 ENCOUNTER — Ambulatory Visit (INDEPENDENT_AMBULATORY_CARE_PROVIDER_SITE_OTHER): Payer: 59 | Admitting: Cardiology

## 2018-07-26 VITALS — BP 110/66 | HR 76 | Ht 63.5 in | Wt 181.2 lb

## 2018-07-26 DIAGNOSIS — R002 Palpitations: Secondary | ICD-10-CM

## 2018-07-26 DIAGNOSIS — E8779 Other fluid overload: Secondary | ICD-10-CM | POA: Diagnosis not present

## 2018-07-26 DIAGNOSIS — I10 Essential (primary) hypertension: Secondary | ICD-10-CM | POA: Diagnosis not present

## 2018-07-26 MED ORDER — HYDROXYZINE HCL 25 MG PO TABS: 25.0000 mg | ORAL_TABLET | Freq: Three times a day (TID) | ORAL | 2 refills | Status: DC | PRN

## 2018-07-26 NOTE — Patient Instructions (Signed)
Your physician wants you to follow-up in: 6 MONTHS WITH DR CRENSHAW You will receive a reminder letter in the mail two months in advance. If you don't receive a letter, please call our office to schedule the follow-up appointment.   If you need a refill on your cardiac medications before your next appointment, please call your pharmacy.  

## 2018-08-06 DIAGNOSIS — M5136 Other intervertebral disc degeneration, lumbar region: Secondary | ICD-10-CM | POA: Insufficient documentation

## 2018-08-06 DIAGNOSIS — M51369 Other intervertebral disc degeneration, lumbar region without mention of lumbar back pain or lower extremity pain: Secondary | ICD-10-CM | POA: Insufficient documentation

## 2018-08-08 ENCOUNTER — Other Ambulatory Visit: Payer: Self-pay | Admitting: Internal Medicine

## 2018-08-08 ENCOUNTER — Telehealth: Payer: Self-pay | Admitting: Internal Medicine

## 2018-08-08 ENCOUNTER — Other Ambulatory Visit (INDEPENDENT_AMBULATORY_CARE_PROVIDER_SITE_OTHER): Payer: 59

## 2018-08-08 DIAGNOSIS — K7460 Unspecified cirrhosis of liver: Secondary | ICD-10-CM

## 2018-08-08 DIAGNOSIS — K7581 Nonalcoholic steatohepatitis (NASH): Secondary | ICD-10-CM | POA: Diagnosis not present

## 2018-08-08 DIAGNOSIS — N179 Acute kidney failure, unspecified: Secondary | ICD-10-CM

## 2018-08-08 LAB — BASIC METABOLIC PANEL
BUN: 26 mg/dL — ABNORMAL HIGH (ref 6–23)
CHLORIDE: 98 meq/L (ref 96–112)
CO2: 26 mEq/L (ref 19–32)
Calcium: 9.5 mg/dL (ref 8.4–10.5)
Creatinine, Ser: 1.27 mg/dL — ABNORMAL HIGH (ref 0.40–1.20)
GFR: 45.33 mL/min — AB (ref >60.00)
Glucose, Bld: 111 mg/dL — ABNORMAL HIGH (ref 70–99)
POTASSIUM: 4.3 meq/L (ref 3.5–5.1)
SODIUM: 132 meq/L — AB (ref 135–145)

## 2018-08-08 NOTE — Progress Notes (Signed)
BUN and creatinine up a little  See phone note - weight up 7.5 # - seemed to start after steroid injection for back pain  Mild edema and increased abd girth   I spoke to her  She will recheck BMET Wed 8/21 I ordered it

## 2018-08-08 NOTE — Telephone Encounter (Signed)
Patient notified of the recommendations.  She will come for labs today

## 2018-08-08 NOTE — Telephone Encounter (Signed)
Patient reprots 7 1/2 lbs. In 2 days.  She had a cortisone injection to her back on Saturday, otherwise no changes in her health.  She has been very strict with her Na intake.  Gained 5 lbs on Sat and 2 1/2 since yesterday.  She reports pedal edema and some slight SOB, but her O2 Saturations are normal. .  Her mentation is very clear.  She is maintained on spironolactone 100 mg and furosemide 40 mg daily.

## 2018-08-08 NOTE — Telephone Encounter (Signed)
I need her to do a BMET to check kidney function    Best to do that first and then I will make recommendations about next steps.

## 2018-08-23 ENCOUNTER — Other Ambulatory Visit: Payer: Self-pay | Admitting: Gastroenterology

## 2018-08-25 ENCOUNTER — Encounter: Payer: Self-pay | Admitting: Family Medicine

## 2018-08-25 ENCOUNTER — Ambulatory Visit (INDEPENDENT_AMBULATORY_CARE_PROVIDER_SITE_OTHER): Payer: 59 | Admitting: Family Medicine

## 2018-08-25 ENCOUNTER — Other Ambulatory Visit (HOSPITAL_COMMUNITY)
Admission: RE | Admit: 2018-08-25 | Discharge: 2018-08-25 | Disposition: A | Discharge status: Home / Self Care | Payer: 59 | Source: Ambulatory Visit | Attending: Family Medicine | Admitting: Family Medicine

## 2018-08-25 ENCOUNTER — Other Ambulatory Visit: Payer: Self-pay

## 2018-08-25 VITALS — BP 112/64 | HR 75 | Temp 98.2°F | Ht 63.5 in | Wt 190.6 lb

## 2018-08-25 DIAGNOSIS — M51369 Other intervertebral disc degeneration, lumbar region without mention of lumbar back pain or lower extremity pain: Secondary | ICD-10-CM

## 2018-08-25 DIAGNOSIS — E782 Mixed hyperlipidemia: Secondary | ICD-10-CM

## 2018-08-25 DIAGNOSIS — G4700 Insomnia, unspecified: Secondary | ICD-10-CM

## 2018-08-25 DIAGNOSIS — K7581 Nonalcoholic steatohepatitis (NASH): Secondary | ICD-10-CM

## 2018-08-25 DIAGNOSIS — Z0000 Encounter for general adult medical examination without abnormal findings: Secondary | ICD-10-CM

## 2018-08-25 DIAGNOSIS — I10 Essential (primary) hypertension: Secondary | ICD-10-CM | POA: Diagnosis not present

## 2018-08-25 DIAGNOSIS — Z23 Encounter for immunization: Secondary | ICD-10-CM | POA: Diagnosis not present

## 2018-08-25 DIAGNOSIS — T887XXA Unspecified adverse effect of drug or medicament, initial encounter: Secondary | ICD-10-CM | POA: Diagnosis not present

## 2018-08-25 DIAGNOSIS — M5136 Other intervertebral disc degeneration, lumbar region: Secondary | ICD-10-CM

## 2018-08-25 DIAGNOSIS — G4709 Other insomnia: Secondary | ICD-10-CM

## 2018-08-25 DIAGNOSIS — K7460 Unspecified cirrhosis of liver: Secondary | ICD-10-CM | POA: Diagnosis not present

## 2018-08-25 DIAGNOSIS — Z1151 Encounter for screening for human papillomavirus (HPV): Secondary | ICD-10-CM | POA: Insufficient documentation

## 2018-08-25 DIAGNOSIS — Z124 Encounter for screening for malignant neoplasm of cervix: Secondary | ICD-10-CM | POA: Diagnosis not present

## 2018-08-25 DIAGNOSIS — K7469 Other cirrhosis of liver: Secondary | ICD-10-CM

## 2018-08-25 LAB — CBC WITH DIFFERENTIAL/PLATELET
BASOS PCT: 1.2 % (ref 0.0–3.0)
Basophils Absolute: 0.1 10*3/uL (ref 0.0–0.1)
EOS ABS: 0.1 10*3/uL (ref 0.0–0.7)
Eosinophils Relative: 2.4 % (ref 0.0–5.0)
HEMATOCRIT: 38.3 % (ref 36.0–46.0)
Hemoglobin: 12.9 g/dL (ref 12.0–15.0)
Lymphocytes Relative: 25.3 % (ref 12.0–46.0)
Lymphs Abs: 1.5 10*3/uL (ref 0.7–4.0)
MCHC: 33.8 g/dL (ref 30.0–36.0)
MCV: 89.8 fl (ref 78.0–100.0)
Monocytes Absolute: 0.6 10*3/uL (ref 0.1–1.0)
Monocytes Relative: 10.5 % (ref 3.0–12.0)
NEUTROS ABS: 3.7 10*3/uL (ref 1.4–7.7)
Neutrophils Relative %: 60.6 % (ref 43.0–77.0)
PLATELETS: 143 10*3/uL — AB (ref 150.0–400.0)
RBC: 4.26 Mil/uL (ref 3.87–5.11)
RDW: 17.3 % — AB (ref 11.5–15.5)
WBC: 6.1 10*3/uL (ref 4.0–10.5)

## 2018-08-25 LAB — COMPREHENSIVE METABOLIC PANEL
ALT: 25 U/L (ref 0–35)
AST: 41 U/L — ABNORMAL HIGH (ref 0–37)
Albumin: 3.4 g/dL — ABNORMAL LOW (ref 3.5–5.2)
Alkaline Phosphatase: 109 U/L (ref 39–117)
BUN: 18 mg/dL (ref 6–23)
CHLORIDE: 98 meq/L (ref 96–112)
CO2: 29 meq/L (ref 19–32)
Calcium: 9.9 mg/dL (ref 8.4–10.5)
Creatinine, Ser: 1.16 mg/dL (ref 0.40–1.20)
GFR: 50.32 mL/min — AB (ref >60.00)
Glucose, Bld: 103 mg/dL — ABNORMAL HIGH (ref 70–99)
POTASSIUM: 5.4 meq/L — AB (ref 3.5–5.1)
Sodium: 133 mEq/L — ABNORMAL LOW (ref 135–145)
Total Bilirubin: 1.9 mg/dL — ABNORMAL HIGH (ref 0.2–1.2)
Total Protein: 7 g/dL (ref 6.0–8.3)

## 2018-08-25 LAB — TSH: TSH: 2.08 u[IU]/mL (ref 0.35–4.50)

## 2018-08-25 LAB — LIPID PANEL
Cholesterol: 232 mg/dL — ABNORMAL HIGH (ref 0–200)
HDL: 78.8 mg/dL (ref >39.00)
LDL CALC: 122 mg/dL — AB (ref 0–99)
NonHDL: 153.6
TRIGLYCERIDES: 159 mg/dL — AB (ref 0.0–149.0)
Total CHOL/HDL Ratio: 3
VLDL: 31.8 mg/dL (ref 0.0–40.0)

## 2018-08-25 LAB — AMMONIA: Ammonia: 68 umol/L — ABNORMAL HIGH (ref 11–35)

## 2018-08-25 MED ORDER — ZOSTER VAC RECOMB ADJUVANTED 50 MCG/0.5ML IM SUSR: 0.5000 mL | Freq: Once | INTRAMUSCULAR | 0 refills | Status: DC

## 2018-08-25 NOTE — Addendum Note (Signed)
Addended bySigurd Sos on: 08/25/2018 09:55 AM   Modules accepted: Orders

## 2018-08-25 NOTE — Progress Notes (Signed)
Subjective  Chief Complaint  Patient presents with  . Annual Exam    doing well, requests flu shot today, pap smear today, Patient is fasting  . Back Pain    has been seeing Dr. Nelva Bush for back injections     HPI: Dawn Moon is a 62 y.o. female who presents to [place] [facility] today for a Female Wellness Visit. She also has the concerns and/or needs as listed above in the chief complaint. These will be addressed in addition to the Health Maintenance Visit.   Wellness Visit: annual visit with health maintenance review and exam with Pap   62 year old female here for complete physical with Pap smear.  Immunizations due: Influenza, Shingrix.  Prevnar was given in July.  Will be due for Pneumovax next July.  Cancer screening is up-to-date. Chronic disease f/u and/or acute problem visit: (deemed necessary to be done in addition to the wellness visit):  Liver cirrhosis with 10 pound weight gain in the last month after epidural steroid injection: She is on daily diuretics but feels fluid accumulation in her lower extremities and feels tightness in her abdomen.  No significant shortness of breath but she complains of hoarseness which typically happens before she has pulmonary edema.  She denies chest pain, fevers or abdominal pain.  Her mental status is clear.  No urinary symptoms  Hypertension and hyperlipidemia follow-up: On medications and have been stable.  She is fasting today for lab work.  DJD of lumbar spine treated by [facility].  Recently given new pain medication although she is not certain of the name.  She will get that information for me.  Secondary insomnia is much improved on trazodone, using twice weekly.  Stress levels are improved.  Transplant appointment has gone well.  Not currently needing to be on transplant list.  Has follow-up with [facility] transplant team next month.  She is seeing Dr. Cherlynn Perches  Assessment  1. Annual physical exam   2.  Liver cirrhosis secondary to NASH ([address])   3. Degeneration of lumbar intervertebral disc   4. Essential hypertension   5. Mixed hyperlipidemia   6. Secondary insomnia   7. Cervical cancer screening   8. Medication side effect      Plan  Female Wellness Visit:  Age appropriate Health Maintenance and Prevention measures were discussed with patient. Included topics are cancer screening recommendations, ways to keep healthy (see AVS) including dietary and exercise recommendations, regular eye and dental care, use of seat belts, and avoidance of moderate alcohol use and tobacco use.  Pap smear with HPV testing done.  BMI: discussed patient's BMI and encouraged positive lifestyle modifications to help get to or maintain a target BMI.  HM needs and immunizations were addressed and ordered. See below for orders. See HM and immunization section for updates.  Flu shot today.  Will update Shingrix next visit.  Routine labs and screening tests ordered including cmp, cbc and lipids where appropriate.  Discussed recommendations regarding Vit D and calcium supplementation (see AVS)  Chronic disease management visit and/or acute problem visit:  Liver cirrhosis with volume overload due to steroid injection: Continue low-sodium and fluid restrictions.  Monitor weight daily's.  Will check renal function and increase diuretic dosing based on her results.  Education given.  DJD followed by Ortho.  Limit steroid injections if possible  Hypertension hyperlipidemia: Follow electrolytes and renal function, check fasting lipids.  Blood pressures controlled.  Insomnia: This medical condition is well controlled.  There are no signs of complications, medication side effects, or red flags. Patient is instructed to continue the current treatment plan without change in therapies or medications.  Follow up: Return in about 6 months (around 02/23/2019) for follow up Hypertension, recheck.  Orders Placed This Encounter    Procedures  . Ammonia  . Comprehensive metabolic panel  . TSH  . Lipid panel  . CBC with Differential/Platelet   Meds ordered this encounter  Medications  . DISCONTD: Zoster Vaccine Adjuvanted [facility]) injection    Sig: Inject 0.5 mLs into the muscle once for 1 dose. Please give 2nd dose 2-6 months after first dose    Dispense:  2 each    Refill:  0      Lifestyle: Body mass index is 33.23 kg/m. Wt Readings from Last 3 Encounters:  08/25/18 190 lb 9.6 oz (86.5 kg)  07/26/18 181 lb 3.2 oz (82.2 kg)  06/22/18 180 lb 6 oz (81.8 kg)   Diet: low sodium Exercise: intermittently,   Patient Active Problem List   Diagnosis Date Noted  . Degeneration of lumbar intervertebral disc 08/06/2018  . Secondary esophageal varices without bleeding ([place]) 06/03/2018    Repeat EGD 05/2019   . Encephalopathy, hepatic ([place]) 05/13/2018  . History of paroxysmal supraventricular tachycardia     episode 2011  consult w/ dr Caryl Comes --  put on atenolol--  per pt no longer an issue   . Liver cirrhosis secondary to NASH ([place]) 04/23/2018    History of fatty liver when followed at [facility], records not yet reviewed serologic work-up not performed based upon this history but history compatible with [name] cirrhosis. Decompensation early 2019 with hepatic hydrothorax and ascites after total knee replacement and then hepatic encephalopathy. Other complication esophageal varices noted EGD, grade 2 06/03/2018 Child's B 7 points 06/22/2018   . Irritable bowel syndrome-diarrhea predominant 06/15/2017  . Gastroesophageal reflux disease 06/15/2017  . Hyperlipidemia   . Kidney stone   . Essential hypertension 02/13/2010    Qualifier: Diagnosis of  By: Barnes, [place] Maintenance  Topic Date Due  . PAP SMEAR  10/22/2017  . MAMMOGRAM  03/05/2018  . INFLUENZA VACCINE  07/21/2018  . COLONOSCOPY  01/12/2021  . TETANUS/TDAP  01/24/2023  . Hepatitis C Screening  Completed  . HIV Screening  Completed    Immunization History  Administered Date(s) Administered  . Hepatitis A, Adult 06/22/2018  . Influenza,inj,Quad PF,6+ Mos 02/21/2018  . Pneumococcal Conjugate-13 06/22/2018   We updated and reviewed the patient's past history in detail and it is documented below. Allergies: Patient has No Known Allergies. Past Medical History Patient  has a past medical history of Ascites, Encephalopathy, hepatic ([place]) (05/13/2018), GERD (gastroesophageal reflux disease), History of exercise stress test, History of kidney stones, History of paroxysmal supraventricular tachycardia, HTN (hypertension), IBS (irritable bowel syndrome), Nonalcoholic steatohepatitis (NASH), Numbness and tingling of left lower extremity, OA (osteoarthritis), Pleural effusion on right, PONV (postoperative nausea and vomiting), Secondary esophageal varices without bleeding ([place]) (06/03/2018), Vitamin D deficiency, and Wears glasses. Past Surgical History Patient  has a past surgical history that includes Colonoscopy (2005); left heart catheterization with coronary angiogram (N/A, 01/11/2012); transthoracic echocardiogram (01/18/2012); CT CTA CORONARY W/CA SCORE W/CM &/OR WO/CM (01/01/2014); Tubal ligation (1985); Tonsillectomy and adenoidectomy (child); Achilles tendon surgery (Left, 06/2016); Breast biopsy (Left, 02/2016); Extracorporeal shock wave lithotripsy (2010); Esophagogastroduodenoscopy (01/2014); Knee arthroscopy (Right, 12/04/2016); and Knee Arthroplasty (Right, 03/24/2018). Family History: Patient family history includes Arthritis in her sister;  Celiac disease in her daughter; Coronary artery disease in her father and mother; Diabetes in her father; Healthy in her brother; Hypertension in her sister; IgA nephropathy in her daughter; Lymphoma in her mother. Social History:  Patient  reports that she has never smoked. She has never used smokeless tobacco. She reports that she drank alcohol. She reports that she does not use  drugs.  Review of Systems: Constitutional: negative for fever or malaise Ophthalmic: negative for photophobia, double vision or loss of vision Cardiovascular: negative for chest pain, dyspnea on exertion, or new LE swelling Respiratory: negative for SOB or persistent cough Gastrointestinal: negative for abdominal pain, change in bowel habits or melena Genitourinary: negative for dysuria or gross hematuria, no abnormal uterine bleeding or disharge Musculoskeletal: negative for new gait disturbance or muscular weakness Integumentary: negative for new or persistent rashes, no breast lumps Neurological: negative for TIA or stroke symptoms Psychiatric: negative for SI or delusions Allergic/Immunologic: negative for hives  Patient Care Team    Relationship Specialty Notifications Start End  Leamon Arnt, MD PCP - General Family Medicine  02/21/18   Lelon Perla, MD PCP - Cardiology Cardiology Admissions 04/25/18   Lelon Perla, MD Consulting Physician Cardiology  02/21/18   Rod Can, MD Consulting Physician Orthopedic Surgery  02/21/18   Enis Gash, MD Consulting Physician Transplant  07/18/18     Objective  Vitals: BP 112/64   Pulse 75   Temp 98.2 F (36.8 C)   Ht 5' 3.5" (1.613 m)   Wt 190 lb 9.6 oz (86.5 kg)   SpO2 98%   BMI 33.23 kg/m  General:  Well developed, well nourished, no acute distress  Psych:  Alert and orientedx3,normal mood and affect HEENT:  Normocephalic, atraumatic, non-icteric sclera, PERRL, oropharynx is clear without mass or exudate, supple neck without adenopathy, mass or thyromegaly Cardiovascular:  Normal S1, S2, RRR with gallop, no rub or murmur, nondisplaced PMI +2 nonpitting edema present to mid calf Respiratory:  Good breath sounds bilaterally, CTAB with normal respiratory effort, minimal basilar rales Gastrointestinal: normal bowel sounds, soft, non-tender, no noted masses. No HSM, possible ascites, no flap MSK: no deformities, contusions.  Joints are without erythema or swelling. Spine and CVA region are nontender Skin:  Warm, no rashes or suspicious lesions noted Neurologic:    Mental status is normal. CN 2-11 are normal. Gross motor and sensory exams are normal. Normal gait. No tremor Breast Exam: No mass, skin retraction or nipple discharge is appreciated in either breast. No axillary adenopathy. Fibrocystic changes are not noted Pelvic Exam: Normal external genitalia, no vulvar or vaginal lesions present, vaginal atrophy. Clear cervix w/o CMT, cervical stenosis present. Bimanual exam reveals a nontender fundus w/o masses, nl size. No adnexal masses present. No inguinal adenopathy. A PAP smear was not performed.    Commons side effects, risks, benefits, and alternatives for medications and treatment plan prescribed today were discussed, and the patient expressed understanding of the given instructions. Patient is instructed to call or message via MyChart if he/she has any questions or concerns regarding our treatment plan. No barriers to understanding were identified. We discussed Red Flag symptoms and signs in detail. Patient expressed understanding regarding what to do in case of urgent or emergency type symptoms.   Medication list was reconciled, printed and provided to the patient in AVS. Patient instructions and summary information was reviewed with the patient as documented in the AVS. This note was prepared with assistance of Dragon voice recognition software.  Occasional wrong-word or sound-a-like substitutions may have occurred due to the inherent limitations of voice recognition software

## 2018-08-25 NOTE — Patient Instructions (Signed)
Please return in 2 weeks for recheck of volume status.  I will call you later this afternoon to adjust up your lasix. Monitor your weight daily. Continue your fluid and salt restrictions.   We will update your shingles vaccination in the near future.  If you have any questions or concerns, please don't hesitate to send me a message via MyChart or call the office at [phone]. Thank you for visiting with [place] today! It's our pleasure caring for you.  Please do these things to maintain good health!   Exercise at least 30-45 minutes a day,  4-5 days a week.   Eat a low-fat diet with lots of fruits and vegetables, up to 7-9 servings per day.  Seatbelts can save your life. Always wear your seatbelt.  Place Smoke Detectors on every level of your home and check batteries every year.  Schedule an appointment with an eye doctor for an eye exam every 1-2 years  [place].  Choose someone you trust that could speak for you if you became unable to speak for yourself.  Depression is common in our stressful world.If you're feeling down or losing interest in things you normally enjoy, please come in for a visit.  If anyone is threatening or hurting you, please get help. Physical or Emotional Violence is never OK.

## 2018-08-25 NOTE — Telephone Encounter (Signed)
Please advise Sir? Thank you. 

## 2018-08-29 ENCOUNTER — Encounter: Payer: Self-pay | Admitting: Internal Medicine

## 2018-08-29 ENCOUNTER — Ambulatory Visit (INDEPENDENT_AMBULATORY_CARE_PROVIDER_SITE_OTHER): Payer: 59 | Admitting: Internal Medicine

## 2018-08-29 VITALS — BP 100/64 | HR 88 | Ht 63.0 in | Wt 189.0 lb

## 2018-08-29 DIAGNOSIS — M5136 Other intervertebral disc degeneration, lumbar region: Secondary | ICD-10-CM

## 2018-08-29 DIAGNOSIS — R609 Edema, unspecified: Secondary | ICD-10-CM

## 2018-08-29 DIAGNOSIS — R188 Other ascites: Secondary | ICD-10-CM | POA: Diagnosis not present

## 2018-08-29 DIAGNOSIS — K7581 Nonalcoholic steatohepatitis (NASH): Secondary | ICD-10-CM | POA: Diagnosis not present

## 2018-08-29 DIAGNOSIS — K7460 Unspecified cirrhosis of liver: Secondary | ICD-10-CM

## 2018-08-29 LAB — CYTOLOGY - PAP
DIAGNOSIS: NEGATIVE
HPV: NOT DETECTED

## 2018-08-29 NOTE — Patient Instructions (Addendum)
  Per Dr Carlean Purl take an extra furosemide tomorrow.    You have been scheduled for an abdominal ultrasound at [facility] on 09/09/18 at 8:00AM. Please arrive 20 minutes prior to your appointment for registration. Make certain not to have anything to eat or drink 8 hours prior to your appointment. Should you need to reschedule your appointment, please contact radiology at [phone]. This test typically takes about 2 hours to perform.   Please up date [place] with your weights in several days.   I appreciate the opportunity to care for you. Silvano Rusk, MD, [facility]

## 2018-08-29 NOTE — Assessment & Plan Note (Addendum)
Increased ascites after epidural steroid injection.  I am not certain that the steroid injection caused this. We will do a complete abdominal ultrasound with liver Doppler to see if there is any type of thrombosis. Take an extra dose of furosemide again tomorrow and she is to update me through my chart or phone call in the next few days with her weights. She has follow-up with Dr. Loletha Grayer in November, at [facility].

## 2018-08-29 NOTE — Progress Notes (Signed)
Dawn Moon 62 y.o. 05-23-1956 [id number]  Assessment & Plan:   Encounter Diagnoses  Name Primary?  . Liver cirrhosis secondary to NASH ([place]) Yes  . Peripheral edema   . Other ascites   . Degeneration of lumbar intervertebral disc     Liver cirrhosis secondary to NASH ([facility]) Increased ascites after epidural steroid injection.  I am not certain that the steroid injection caused this. We will do a complete abdominal ultrasound with liver Doppler to see if there is any type of thrombosis. Take an extra dose of furosemide again tomorrow and she is to update me through my chart or phone call in the next few days with her weights. She has follow-up with Dr. Loletha Grayer in November, at [facility].  Degeneration of lumbar intervertebral disc She is having quite severe pain from this.  To start Nucynta given lack of adequacy of the steroid injections and probably because of the fluid gain afterwards.  Though I am not sure that this is true cause-and-effect.    I appreciate the opportunity to care for this patient. CC: Dawn Arnt, MD Dr. Enis Gash, [facility] liver [facility]  Subjective:   Chief Complaint: Weight gain fluid gain  HPI Dawn Moon is here after having an epidural steroid injection in late August, her weight was at about 180 and it went up to 191 maximum.  Fluid retention with peripheral edema and increasing abdominal girth occurred.  She saw Dr. Jonni Sanger, I had previously checked kidney function which was slightly worse after that, it was normal or close to normal when she saw Dr. Jonni Sanger and Dr. Jonni Sanger recommended extra furosemide taking 80 instead of 40 and the patient is done that on 3 or 4 mornings with improvement in weight down to 183.5 pounds at home.  Weights in our system listed below different due to clothing etc. she is having pretty bad low back pain that is limiting her movement and quality of life and is waiting to get approval for Nucynta 50 mg daily.  The epidural injection  did not help her. Wt Readings from Last 3 Encounters:  08/29/18 189 lb (85.7 kg)  08/25/18 190 lb 9.6 oz (86.5 kg)  07/26/18 181 lb 3.2 oz (82.2 kg)   On 9 5 BUN 18 creatinine 1.16.  Sodium 133 potassium 5.4.   No Known Allergies Current Meds  Medication Sig  . Butalbital-APAP-Caffeine 50-300-40 MG CAPS Take 1 tablet by mouth every 8 (eight) hours as needed.  . Eluxadoline (VIBERZI) 75 MG TABS Take 75 mg by mouth 2 (two) times daily. (Patient taking differently: Take 75 mg by mouth as needed. )  . furosemide (LASIX) 40 MG tablet Take 1 tablet (40 mg total) by mouth daily.  . hydrOXYzine (ATARAX/VISTARIL) 25 MG tablet Take 1 tablet (25 mg total) by mouth 3 (three) times daily as needed for anxiety.  Marland Kitchen lactulose (CHRONULAC) 10 GM/15ML solution Take 15 mLs (10 g total) by mouth 2 (two) times daily.  . nadolol (CORGARD) 20 MG tablet Take 0.5 tablets (10 mg total) by mouth daily. If tolerated go back to 20 mg daily  . pantoprazole (PROTONIX) 40 MG tablet Take 1 tablet (40 mg total) by mouth every morning.  . promethazine (PHENERGAN) 25 MG tablet Take 1 tablet (25 mg total) by mouth every 6 (six) hours as needed for nausea or vomiting.  . rifaximin (XIFAXAN) 550 MG TABS tablet Take 1 tablet (550 mg total) by mouth 2 (two) times daily.  Marland Kitchen spironolactone (ALDACTONE)  100 MG tablet TAKE 1 TABLET DAILY  . tapentadol (NUCYNTA) 50 MG tablet Take 50 mg by mouth.  . traZODone (DESYREL) 50 MG tablet Take 0.5-1 tablets (25-50 mg total) by mouth at bedtime as needed for sleep.   Past Medical History:  Diagnosis Date  . Ascites   . Encephalopathy, hepatic ([place]) 05/13/2018  . GERD (gastroesophageal reflux disease)   . History of exercise stress test    03-07-2010  Stress echo--- no arrhythmias or conduction abnormalilites and negative for ischemia or chest pain  . History of kidney stones   . History of paroxysmal supraventricular tachycardia    episode 2011  consult w/ dr Dawn Moon --  put on atenolol--   per pt no longer an issue  . HTN (hypertension)    cardiologist --  dr Stanford Breed  . IBS (irritable bowel syndrome)    diarrhea  . Nonalcoholic steatohepatitis ([facility])   . Numbness and tingling of left lower extremity    post achilles tendon repair  . OA (osteoarthritis)    knees  . Pleural effusion on right    hepatic hydrothorax  . PONV (postoperative nausea and vomiting)   . Secondary esophageal varices without bleeding ([place]) 06/03/2018  . Vitamin D deficiency   . Wears glasses    Past Surgical History:  Procedure Laterality Date  . ACHILLES TENDON SURGERY Left 06/2016  . BREAST BIOPSY Left 02/2016   benign  . COLONOSCOPY  2005  . CT CTA CORONARY W/CA SCORE W/CM &/OR WO/CM  01/01/2014   non-obstructive calcified plaque in pLAD (0-25%), no significant incidental noncardiac findings noted  . ESOPHAGOGASTRODUODENOSCOPY  01/2014  . EXTRACORPOREAL SHOCK WAVE LITHOTRIPSY  2010  . KNEE ARTHROPLASTY Right 03/24/2018   Procedure: RIGHT TOTAL KNEE ARTHROPLASTY WITH COMPUTER NAVIGATION;  Surgeon: Dawn Can, MD;  Location: [facility];  Service: Orthopedics;  Laterality: Right;  Needs RNFA  . KNEE ARTHROSCOPY Right 12/04/2016   Procedure: ARTHROSCOPY KNEE WITH PARTIAL MEDIAL MENISCECTOMY;  Surgeon: Dawn Can, MD;  Location: [facility];  Service: Orthopedics;  Laterality: Right;  . LEFT HEART CATHETERIZATION WITH CORONARY ANGIOGRAM N/A 01/11/2012   Procedure: LEFT HEART CATHETERIZATION WITH CORONARY ANGIOGRAM;  Surgeon: Dawn Mocha, MD;  Location: [facility] CATH LAB;  Service: Cardiovascular;  Laterality: N/A;  widely patent coronary arterires without significant obstructive CAD,  normal LVF, ef 55-56%  . TONSILLECTOMY AND ADENOIDECTOMY  child  . TRANSTHORACIC ECHOCARDIOGRAM  01/18/2012   mild LVH,  ef 60%/  trivial TR  . 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  Has to walk up stairs to work and can do w/o Ss.  Lives @ home in [place] with family.   family history includes Arthritis in her sister; Celiac disease in her daughter; Coronary artery disease in her father and mother; Diabetes in her father; Healthy in her brother; Hypertension in her sister; IgA nephropathy in her daughter; Lymphoma in her mother.   Review of Systems As above  Objective:   Physical Exam BP 100/64 (BP Location: Left Arm, Patient Position: Sitting, Cuff Size: Normal)   Pulse 88   Ht 5' 3"  (1.6 m)   Wt 189 lb (85.7 kg)   BMI 33.48 kg/m  NAD Anicteric Lungs cta Cor NL abd  obese - probable ascites Ext 1+ LE pretib edema She is alert noted x3 no signs of confusion appropriate mood and affect

## 2018-08-29 NOTE — Telephone Encounter (Signed)
Dr Carlean Purl refilled this over the weekend.

## 2018-08-29 NOTE — Assessment & Plan Note (Signed)
She is having quite severe pain from this.  To start Nucynta given lack of adequacy of the steroid injections and probably because of the fluid gain afterwards.  Though I am not sure that this is true cause-and-effect.

## 2018-09-05 ENCOUNTER — Encounter: Payer: Self-pay | Admitting: Family Medicine

## 2018-09-05 ENCOUNTER — Ambulatory Visit
Admission: RE | Admit: 2018-09-05 | Discharge: 2018-09-05 | Disposition: A | Discharge status: Home / Self Care | Payer: 59 | Source: Ambulatory Visit | Attending: Family Medicine | Admitting: Family Medicine

## 2018-09-05 DIAGNOSIS — Z1231 Encounter for screening mammogram for malignant neoplasm of breast: Secondary | ICD-10-CM

## 2018-09-08 ENCOUNTER — Ambulatory Visit: Payer: 59 | Admitting: Family Medicine

## 2018-09-09 ENCOUNTER — Ambulatory Visit
Admission: RE | Admit: 2018-09-09 | Discharge: 2018-09-09 | Disposition: A | Discharge status: Home / Self Care | Payer: 59 | Source: Ambulatory Visit | Attending: Internal Medicine | Admitting: Internal Medicine

## 2018-09-09 ENCOUNTER — Other Ambulatory Visit: Payer: Self-pay

## 2018-09-09 DIAGNOSIS — K2100 Gastro-esophageal reflux disease with esophagitis, without bleeding: Secondary | ICD-10-CM

## 2018-09-09 DIAGNOSIS — K7460 Unspecified cirrhosis of liver: Secondary | ICD-10-CM

## 2018-09-09 DIAGNOSIS — K7581 Nonalcoholic steatohepatitis (NASH): Principal | ICD-10-CM

## 2018-09-09 DIAGNOSIS — R188 Other ascites: Secondary | ICD-10-CM

## 2018-09-12 MED ORDER — SPIRONOLACTONE 100 MG PO TABS: 100.0000 mg | ORAL_TABLET | Freq: Every day | ORAL | 0 refills | Status: DC

## 2018-09-12 MED ORDER — PANTOPRAZOLE SODIUM 40 MG PO TBEC: 40.0000 mg | DELAYED_RELEASE_TABLET | Freq: Every morning | ORAL | 0 refills | Status: DC

## 2018-09-12 MED ORDER — FUROSEMIDE 40 MG PO TABS: 40.0000 mg | ORAL_TABLET | Freq: Every day | ORAL | 0 refills | Status: DC

## 2018-09-12 MED ORDER — NADOLOL 20 MG PO TABS: 10.0000 mg | ORAL_TABLET | Freq: Every day | ORAL | 0 refills | Status: DC

## 2018-09-12 NOTE — Telephone Encounter (Signed)
Scripts sent

## 2018-09-13 NOTE — Progress Notes (Signed)
My Chart note re: results  Nothing new here

## 2018-09-16 ENCOUNTER — Other Ambulatory Visit: Payer: Self-pay

## 2018-09-16 ENCOUNTER — Ambulatory Visit (INDEPENDENT_AMBULATORY_CARE_PROVIDER_SITE_OTHER): Payer: 59 | Admitting: Family Medicine

## 2018-09-16 ENCOUNTER — Encounter: Payer: Self-pay | Admitting: Family Medicine

## 2018-09-16 VITALS — BP 110/80 | HR 81 | Temp 97.8°F | Ht 63.0 in | Wt 193.0 lb

## 2018-09-16 DIAGNOSIS — K7581 Nonalcoholic steatohepatitis (NASH): Secondary | ICD-10-CM | POA: Diagnosis not present

## 2018-09-16 DIAGNOSIS — M5136 Other intervertebral disc degeneration, lumbar region: Secondary | ICD-10-CM

## 2018-09-16 DIAGNOSIS — Z23 Encounter for immunization: Secondary | ICD-10-CM | POA: Diagnosis not present

## 2018-09-16 DIAGNOSIS — K7460 Unspecified cirrhosis of liver: Secondary | ICD-10-CM | POA: Diagnosis not present

## 2018-09-16 LAB — BASIC METABOLIC PANEL
BUN: 17 mg/dL (ref 6–23)
CALCIUM: 9.4 mg/dL (ref 8.4–10.5)
CO2: 26 mEq/L (ref 19–32)
Chloride: 105 mEq/L (ref 96–112)
Creatinine, Ser: 1.34 mg/dL — ABNORMAL HIGH (ref 0.40–1.20)
GFR: 42.59 mL/min — AB (ref >60.00)
Glucose, Bld: 98 mg/dL (ref 70–99)
Potassium: 4.4 mEq/L (ref 3.5–5.1)
SODIUM: 138 meq/L (ref 135–145)

## 2018-09-16 MED ORDER — PROMETHAZINE HCL 25 MG PO TABS: 25.0000 mg | ORAL_TABLET | Freq: Four times a day (QID) | ORAL | 1 refills | Status: DC | PRN

## 2018-09-16 NOTE — Patient Instructions (Signed)
Please return in 6 months for recheck and  for follow up of your hypertension.   If you have any questions or concerns, please don't hesitate to send me a message via MyChart or call the office at [phone]. Thank you for visiting with [place] today! It's our pleasure caring for you.  Take care!

## 2018-09-16 NOTE — Progress Notes (Signed)
Subjective  CC:  Chief Complaint  Patient presents with  . Follow-up    Fluid Volume, patient states she is doing ok, but still struggling with Back Pain     HPI: Dawn Moon is a 62 y.o. female who presents to the office today to address the problems listed above in the chief complaint.  Dawn Moon is here for follow-up after volume retention after a steroid injection.  Since being here, she increased her Lasix for several days and diuresis.  She has seen her gastroenterologist.  An ultrasound and Dopplers of her liver were negative for acute thrombosis.  From a GI standpoint she is feeling well.  No mental status changes, abdominal pain, ascites or bloating.  She feels her weight is partially increased because she has been more.  She tends to stress eat when she is hurting.  However she continues to have back pain.  She has follow-up with orthopedics to try to assess how to best manage this.  She is not tolerating the Nucynta due to nausea vomiting. BP Readings from Last 3 Encounters:  09/16/18 110/80  08/29/18 100/64  08/25/18 112/64   Wt Readings from Last 3 Encounters:  09/16/18 193 lb (87.5 kg)  08/29/18 189 lb (85.7 kg)  08/25/18 190 lb 9.6 oz (86.5 kg)    Assessment  1. Liver cirrhosis secondary to NASH ([place])   2. Degeneration of lumbar intervertebral disc      Plan   Cirrhosis with recent volume overload: Currently improved.  Continue current medications.  Recheck BMP.  Had mild hyponatremia and hyperkalemia that needed follow-up.  Renal function was stable  DJD with back pain: Recommend tramadol as this is worked well for her in the past.  To monitor liver status.  She will follow-up with Ortho and GI to assess if this is okay.  Refilled her Phenergan to use as needed.  Shingrix No. 1 today  Follow up: Return in about 6 months (around 03/17/2019).   Orders Placed This Encounter  Procedures  . Basic Metabolic Panel (BMET)   Meds ordered this encounter    Medications  . promethazine (PHENERGAN) 25 MG tablet    Sig: Take 1 tablet (25 mg total) by mouth every 6 (six) hours as needed for nausea or vomiting.    Dispense:  30 tablet    Refill:  1      I reviewed the patients updated PMH, FH, and SocHx.    Patient Active Problem List   Diagnosis Date Noted  . Degeneration of lumbar intervertebral disc 08/06/2018  . Secondary esophageal varices without bleeding ([place]) 06/03/2018  . Encephalopathy, hepatic ([place]) 05/13/2018  . History of paroxysmal supraventricular tachycardia   . Liver cirrhosis secondary to NASH ([place]) 04/23/2018  . Irritable bowel syndrome-diarrhea predominant 06/15/2017  . Gastroesophageal reflux disease 06/15/2017  . Hyperlipidemia   . Kidney stone   . Essential hypertension 02/13/2010   Current Meds  Medication Sig  . Butalbital-APAP-Caffeine 50-300-40 MG CAPS Take 1 tablet by mouth every 8 (eight) hours as needed.  . Eluxadoline (VIBERZI) 75 MG TABS Take 75 mg by mouth 2 (two) times daily. (Patient taking differently: Take 75 mg by mouth as needed. )  . furosemide (LASIX) 40 MG tablet Take 1 tablet (40 mg total) by mouth daily.  . hydrOXYzine (ATARAX/VISTARIL) 25 MG tablet Take 1 tablet (25 mg total) by mouth 3 (three) times daily as needed for anxiety.  Marland Kitchen lactulose (CHRONULAC) 10 GM/15ML solution Take 15 mLs (10 g  total) by mouth 2 (two) times daily.  . nadolol (CORGARD) 20 MG tablet Take 0.5 tablets (10 mg total) by mouth daily. If tolerated go back to 20 mg daily  . pantoprazole (PROTONIX) 40 MG tablet Take 1 tablet (40 mg total) by mouth every morning.  . promethazine (PHENERGAN) 25 MG tablet Take 1 tablet (25 mg total) by mouth every 6 (six) hours as needed for nausea or vomiting.  . rifaximin (XIFAXAN) 550 MG TABS tablet Take 1 tablet (550 mg total) by mouth 2 (two) times daily.  Marland Kitchen spironolactone (ALDACTONE) 100 MG tablet Take 1 tablet (100 mg total) by mouth daily.  . tapentadol (NUCYNTA) 50 MG tablet Take 50  mg by mouth.  . traZODone (DESYREL) 50 MG tablet Take 0.5-1 tablets (25-50 mg total) by mouth at bedtime as needed for sleep.  . [DISCONTINUED] promethazine (PHENERGAN) 25 MG tablet Take 1 tablet (25 mg total) by mouth every 6 (six) hours as needed for nausea or vomiting.    Allergies: Patient has No Known Allergies. Family History: Patient family history includes Arthritis in her sister; Celiac disease in her daughter; Coronary artery disease in her father and mother; Diabetes in her father; Healthy in her brother; Hypertension in her sister; IgA nephropathy in her daughter; Lymphoma in her mother. Social History:  Patient  reports that she has never smoked. She has never used smokeless tobacco. She reports that she drank alcohol. She reports that she does not use drugs.  Review of Systems: Constitutional: Negative for fever malaise or anorexia Cardiovascular: negative for chest pain Respiratory: negative for SOB or persistent cough Gastrointestinal: negative for abdominal pain  Objective  Vitals: BP 110/80   Pulse 81   Temp 97.8 F (36.6 C)   Ht 5' 3"  (1.6 m)   Wt 193 lb (87.5 kg)   SpO2 97%   BMI 34.19 kg/m  General: no acute distress , A&Ox3 HEENT: PEERL, conjunctiva normal, Oropharynx moist,neck is supple Cardiovascular:  RRR without murmur or gallop.  Respiratory:  Good breath sounds bilaterally, CTAB with normal respiratory effort Skin:  Warm, no rashes LE tr edema No asterixis  No visits with results within 1 Day(s) from this visit.  Latest known visit with results is:  Office Visit on 08/25/2018  Component Date Value Ref Range Status  . Ammonia 08/25/2018 68* 11 - 35 umol/L Final  . Sodium 08/25/2018 133* 135 - 145 mEq/L Final  . Potassium 08/25/2018 5.4* 3.5 - 5.1 mEq/L Final  . Chloride 08/25/2018 98  96 - 112 mEq/L Final  . CO2 08/25/2018 29  19 - 32 mEq/L Final  . Glucose, Bld 08/25/2018 103* 70 - 99 mg/dL Final  . BUN 08/25/2018 18  6 - 23 mg/dL Final  .  Creatinine, Ser 08/25/2018 1.16  0.40 - 1.20 mg/dL Final  . Total Bilirubin 08/25/2018 1.9* 0.2 - 1.2 mg/dL Final  . Alkaline Phosphatase 08/25/2018 109  39 - 117 U/L Final  . AST 08/25/2018 41* 0 - 37 U/L Final  . ALT 08/25/2018 25  0 - 35 U/L Final  . Total Protein 08/25/2018 7.0  6.0 - 8.3 g/dL Final  . Albumin 08/25/2018 3.4* 3.5 - 5.2 g/dL Final  . Calcium 08/25/2018 9.9  8.4 - 10.5 mg/dL Final  . GFR 08/25/2018 50.32* >60.00 mL/min Final  . TSH 08/25/2018 2.08  0.35 - 4.50 uIU/mL Final  . Cholesterol 08/25/2018 232* 0 - 200 mg/dL Final  . Triglycerides 08/25/2018 159.0* 0.0 - 149.0 mg/dL Final  .  HDL 08/25/2018 78.80  >39.00 mg/dL Final  . VLDL 08/25/2018 31.8  0.0 - 40.0 mg/dL Final  . LDL Cholesterol 08/25/2018 122* 0 - 99 mg/dL Final  . Total CHOL/HDL Ratio 08/25/2018 3   Final  . NonHDL 08/25/2018 153.60   Final  . WBC 08/25/2018 6.1  4.0 - 10.5 K/uL Final  . RBC 08/25/2018 4.26  3.87 - 5.11 Mil/uL Final  . Hemoglobin 08/25/2018 12.9  12.0 - 15.0 g/dL Final  . HCT 08/25/2018 38.3  36.0 - 46.0 % Final  . MCV 08/25/2018 89.8  78.0 - 100.0 fl Final  . MCHC 08/25/2018 33.8  30.0 - 36.0 g/dL Final  . RDW 08/25/2018 17.3* 11.5 - 15.5 % Final  . Platelets 08/25/2018 143.0* 150.0 - 400.0 K/uL Final  . Neutrophils Relative % 08/25/2018 60.6  43.0 - 77.0 % Final  . Lymphocytes Relative 08/25/2018 25.3  12.0 - 46.0 % Final  . Monocytes Relative 08/25/2018 10.5  3.0 - 12.0 % Final  . Eosinophils Relative 08/25/2018 2.4  0.0 - 5.0 % Final  . Basophils Relative 08/25/2018 1.2  0.0 - 3.0 % Final  . Neutro Abs 08/25/2018 3.7  1.4 - 7.7 K/uL Final  . Lymphs Abs 08/25/2018 1.5  0.7 - 4.0 K/uL Final  . Monocytes Absolute 08/25/2018 0.6  0.1 - 1.0 K/uL Final  . Eosinophils Absolute 08/25/2018 0.1  0.0 - 0.7 K/uL Final  . Basophils Absolute 08/25/2018 0.1  0.0 - 0.1 K/uL Final  . Adequacy 08/25/2018 Satisfactory for evaluation  endocervical/transformation zone component PRESENT.   Final  .  Diagnosis 08/25/2018 NEGATIVE FOR INTRAEPITHELIAL LESIONS OR MALIGNANCY.   Final  . HPV 08/25/2018 NOT DETECTED   Final  . Material Submitted 08/25/2018 CervicoVaginal Pap [ThinPrep Imaged]   Final      Commons side effects, risks, benefits, and alternatives for medications and treatment plan prescribed today were discussed, and the patient expressed understanding of the given instructions. Patient is instructed to call or message via MyChart if he/she has any questions or concerns regarding our treatment plan. No barriers to understanding were identified. We discussed Red Flag symptoms and signs in detail. Patient expressed understanding regarding what to do in case of urgent or emergency type symptoms.   Medication list was reconciled, printed and provided to the patient in AVS. Patient instructions and summary information was reviewed with the patient as documented in the AVS. This note was prepared with assistance of Dragon voice recognition software. Occasional wrong-word or sound-a-like substitutions may have occurred due to the inherent limitations of voice recognition software

## 2018-09-16 NOTE — Addendum Note (Signed)
Addended bySigurd Sos on: 09/16/2018 09:29 AM   Modules accepted: Orders

## 2018-09-20 ENCOUNTER — Other Ambulatory Visit: Payer: Self-pay | Admitting: Family Medicine

## 2018-10-06 ENCOUNTER — Other Ambulatory Visit (INDEPENDENT_AMBULATORY_CARE_PROVIDER_SITE_OTHER): Payer: 59

## 2018-10-06 ENCOUNTER — Telehealth: Payer: Self-pay | Admitting: Gastroenterology

## 2018-10-06 DIAGNOSIS — K7460 Unspecified cirrhosis of liver: Secondary | ICD-10-CM | POA: Diagnosis not present

## 2018-10-06 DIAGNOSIS — K7581 Nonalcoholic steatohepatitis (NASH): Principal | ICD-10-CM

## 2018-10-06 LAB — CBC WITH DIFFERENTIAL/PLATELET
BASOS ABS: 0.1 10*3/uL (ref 0.0–0.1)
Basophils Relative: 0.9 % (ref 0.0–3.0)
EOS ABS: 0.2 10*3/uL (ref 0.0–0.7)
Eosinophils Relative: 2.3 % (ref 0.0–5.0)
HCT: 39.8 % (ref 36.0–46.0)
Hemoglobin: 13.4 g/dL (ref 12.0–15.0)
LYMPHS ABS: 2.3 10*3/uL (ref 0.7–4.0)
Lymphocytes Relative: 32.6 % (ref 12.0–46.0)
MCHC: 33.5 g/dL (ref 30.0–36.0)
MCV: 90.8 fl (ref 78.0–100.0)
MONO ABS: 0.8 10*3/uL (ref 0.1–1.0)
Monocytes Relative: 10.6 % (ref 3.0–12.0)
NEUTROS PCT: 53.6 % (ref 43.0–77.0)
Neutro Abs: 3.8 10*3/uL (ref 1.4–7.7)
PLATELETS: 140 10*3/uL — AB (ref 150.0–400.0)
RBC: 4.39 Mil/uL (ref 3.87–5.11)
RDW: 14.9 % (ref 11.5–15.5)
WBC: 7.1 10*3/uL (ref 4.0–10.5)

## 2018-10-06 LAB — HEPATIC FUNCTION PANEL
ALBUMIN: 3.3 g/dL — AB (ref 3.5–5.2)
ALT: 17 U/L (ref 0–35)
AST: 28 U/L (ref 0–37)
Alkaline Phosphatase: 82 U/L (ref 39–117)
Bilirubin, Direct: 0.4 mg/dL — ABNORMAL HIGH (ref 0.0–0.3)
TOTAL PROTEIN: 7 g/dL (ref 6.0–8.3)
Total Bilirubin: 1.6 mg/dL — ABNORMAL HIGH (ref 0.2–1.2)

## 2018-10-06 LAB — LIPASE: LIPASE: 52 U/L (ref 11.0–59.0)

## 2018-10-06 LAB — AMYLASE: Amylase: 31 U/L (ref 27–131)

## 2018-10-06 NOTE — Telephone Encounter (Signed)
Patient reports that she has ruq pain last night and diarrhea.  She took the tramadol for pain and this has resolved the pain, but she reports she still has RUQ tenderness this am.  She reports that she took [place] last pm about 7 for the diarrhea.  She reports that the pain started early in the am at 2:00.  She is asking if she needs any testing?

## 2018-10-06 NOTE — Telephone Encounter (Signed)
Patient notified

## 2018-10-06 NOTE — Telephone Encounter (Signed)
CBC, LFT, amylase and lipase

## 2018-10-06 NOTE — Telephone Encounter (Signed)
Dawn Moon this is a Dr Carlean Purl pt.

## 2018-10-07 NOTE — Progress Notes (Signed)
Labs are ok  Hopefully feeling better  Might have been a gastroenteritis  My Chart

## 2018-10-19 ENCOUNTER — Other Ambulatory Visit: Payer: Self-pay | Admitting: Internal Medicine

## 2018-10-19 ENCOUNTER — Other Ambulatory Visit: Payer: Self-pay | Admitting: Cardiology

## 2018-10-19 ENCOUNTER — Other Ambulatory Visit: Payer: Self-pay | Admitting: Family Medicine

## 2018-10-19 DIAGNOSIS — K2100 Gastro-esophageal reflux disease with esophagitis, without bleeding: Secondary | ICD-10-CM

## 2018-10-19 NOTE — Telephone Encounter (Signed)
Also requesting Hydroxyzine 81m tabs, originally prescribed by Dr. CStanford Breed per Pharmacy request Dr. CStanford Breeddenied, please advise.   ADoloris Hall  LPN

## 2018-10-19 NOTE — Telephone Encounter (Signed)
How many refills Sir?

## 2018-10-20 MED ORDER — HYDROXYZINE HCL 25 MG PO TABS: 25.0000 mg | ORAL_TABLET | Freq: Three times a day (TID) | ORAL | 2 refills | Status: DC | PRN

## 2018-10-20 NOTE — Telephone Encounter (Signed)
refill x 6 mos

## 2018-10-20 NOTE — Telephone Encounter (Signed)
meds refilled 

## 2018-11-10 ENCOUNTER — Encounter: Payer: Self-pay | Admitting: Family Medicine

## 2018-12-06 ENCOUNTER — Encounter: Payer: Self-pay | Admitting: Family Medicine

## 2018-12-06 NOTE — Telephone Encounter (Signed)
Labs were received from Dr. Loletha Grayer at [facility], pt. Scheduled for lab appt 12/08/18

## 2018-12-08 ENCOUNTER — Other Ambulatory Visit: Payer: 59

## 2018-12-09 ENCOUNTER — Other Ambulatory Visit (INDEPENDENT_AMBULATORY_CARE_PROVIDER_SITE_OTHER): Payer: 59

## 2018-12-09 DIAGNOSIS — K7581 Nonalcoholic steatohepatitis (NASH): Secondary | ICD-10-CM | POA: Diagnosis not present

## 2018-12-09 DIAGNOSIS — K7460 Unspecified cirrhosis of liver: Secondary | ICD-10-CM

## 2018-12-09 LAB — COMPREHENSIVE METABOLIC PANEL
ALBUMIN: 3.4 g/dL — AB (ref 3.5–5.2)
ALT: 25 U/L (ref 0–35)
AST: 45 U/L — ABNORMAL HIGH (ref 0–37)
Alkaline Phosphatase: 93 U/L (ref 39–117)
BUN: 17 mg/dL (ref 6–23)
CALCIUM: 9.7 mg/dL (ref 8.4–10.5)
CHLORIDE: 103 meq/L (ref 96–112)
CO2: 27 meq/L (ref 19–32)
CREATININE: 1.3 mg/dL — AB (ref 0.40–1.20)
GFR: 44.07 mL/min — AB (ref >60.00)
Glucose, Bld: 106 mg/dL — ABNORMAL HIGH (ref 70–99)
POTASSIUM: 4 meq/L (ref 3.5–5.1)
Sodium: 138 mEq/L (ref 135–145)
Total Bilirubin: 1.6 mg/dL — ABNORMAL HIGH (ref 0.2–1.2)
Total Protein: 7.2 g/dL (ref 6.0–8.3)

## 2018-12-09 LAB — CBC WITH DIFFERENTIAL/PLATELET
BASOS PCT: 1.3 % (ref 0.0–3.0)
Basophils Absolute: 0.1 10*3/uL (ref 0.0–0.1)
EOS ABS: 0.2 10*3/uL (ref 0.0–0.7)
EOS PCT: 3.3 % (ref 0.0–5.0)
HEMATOCRIT: 39.7 % (ref 36.0–46.0)
HEMOGLOBIN: 13.6 g/dL (ref 12.0–15.0)
LYMPHS PCT: 29.4 % (ref 12.0–46.0)
Lymphs Abs: 1.7 10*3/uL (ref 0.7–4.0)
MCHC: 34.1 g/dL (ref 30.0–36.0)
MCV: 91.2 fl (ref 78.0–100.0)
Monocytes Absolute: 0.6 10*3/uL (ref 0.1–1.0)
Monocytes Relative: 10.2 % (ref 3.0–12.0)
Neutro Abs: 3.2 10*3/uL (ref 1.4–7.7)
Neutrophils Relative %: 55.8 % (ref 43.0–77.0)
Platelets: 141 10*3/uL — ABNORMAL LOW (ref 150.0–400.0)
RBC: 4.35 Mil/uL (ref 3.87–5.11)
RDW: 14.8 % (ref 11.5–15.5)
WBC: 5.7 10*3/uL (ref 4.0–10.5)

## 2018-12-09 NOTE — Addendum Note (Signed)
Addended by: Doran Clay A on: 12/09/2018 08:17 AM   Modules accepted: Orders

## 2018-12-10 LAB — PROTIME-INR
INR: 1.1
PROTHROMBIN TIME: 11.3 s (ref 9.0–11.5)

## 2018-12-12 NOTE — Telephone Encounter (Signed)
-----   Message from Leamon Arnt, MD sent at 12/12/2018 11:50 AM EST ----- Labs reviewed.Please fax to ordering physician. Thanks.

## 2018-12-12 NOTE — Progress Notes (Signed)
Labs reviewed.Please fax to ordering physician. Thanks.

## 2018-12-12 NOTE — Telephone Encounter (Signed)
Labs have been faxed to Dr. Enis Gash

## 2018-12-28 ENCOUNTER — Other Ambulatory Visit: Payer: Self-pay

## 2018-12-28 ENCOUNTER — Encounter: Payer: Self-pay | Admitting: Physical Therapy

## 2018-12-28 ENCOUNTER — Ambulatory Visit: Payer: 59 | Attending: Physical Medicine and Rehabilitation | Admitting: Physical Therapy

## 2018-12-28 DIAGNOSIS — G8929 Other chronic pain: Secondary | ICD-10-CM | POA: Insufficient documentation

## 2018-12-28 DIAGNOSIS — M545 Low back pain, unspecified: Secondary | ICD-10-CM

## 2018-12-28 DIAGNOSIS — R293 Abnormal posture: Secondary | ICD-10-CM | POA: Insufficient documentation

## 2018-12-28 NOTE — Therapy (Signed)
[facility] [place], [place], [address] Phone: [phone]   Fax:  [phone]  Physical Therapy Treatment  Patient Details  Name: Dawn Moon MRN: [id number] Date of Birth: 1956/10/10 Referring Provider (PT): Suella Broad MD   Encounter Date: 12/28/2018  PT End of Session - 12/28/18 1317    Visit Number  1    Number of Visits  12    Date for PT Re-Evaluation  02/08/19    Authorization Type  FOTO AT LEAST EVERY 5TH VISIT, 10TH VISIT PROGRESS NOTE AND KX MODIFIER AFTER THE 15 VISIT.    PT Start Time  1115    PT Stop Time  1202    PT Time Calculation (min)  47 min    Activity Tolerance  Patient tolerated treatment well    Behavior During Therapy  WFL for tasks assessed/performed       Past Medical History:  Diagnosis Date  . Ascites   . Encephalopathy, hepatic ([place]) 05/13/2018  . GERD (gastroesophageal reflux disease)   . History of exercise stress test    03-07-2010  Stress echo--- no arrhythmias or conduction abnormalilites and negative for ischemia or chest pain  . History of kidney stones   . History of paroxysmal supraventricular tachycardia    episode 2011  consult w/ dr Caryl Comes --  put on atenolol--  per pt no longer an issue  . HTN (hypertension)    cardiologist --  dr Stanford Breed  . IBS (irritable bowel syndrome)    diarrhea  . Nonalcoholic steatohepatitis (NASH)   . Numbness and tingling of left lower extremity    post achilles tendon repair  . OA (osteoarthritis)    knees  . Pleural effusion on right    hepatic hydrothorax  . PONV (postoperative nausea and vomiting)   . Secondary esophageal varices without bleeding ([place]) 06/03/2018  . Vitamin D deficiency   . Wears glasses     Past Surgical History:  Procedure Laterality Date  . ACHILLES TENDON SURGERY Left 06/2016  . BREAST BIOPSY Left 02/2016   benign  . COLONOSCOPY  2005  . CT CTA CORONARY W/CA SCORE W/CM &/OR WO/CM  01/01/2014    non-obstructive calcified plaque in pLAD (0-25%), no significant incidental noncardiac findings noted  . ESOPHAGOGASTRODUODENOSCOPY  01/2014  . EXTRACORPOREAL SHOCK WAVE LITHOTRIPSY  2010  . KNEE ARTHROPLASTY Right 03/24/2018   Procedure: RIGHT TOTAL KNEE ARTHROPLASTY WITH COMPUTER NAVIGATION;  Surgeon: Rod Can, MD;  Location: [facility];  Service: Orthopedics;  Laterality: Right;  Needs RNFA  . KNEE ARTHROSCOPY Right 12/04/2016   Procedure: ARTHROSCOPY KNEE WITH PARTIAL MEDIAL MENISCECTOMY;  Surgeon: Rod Can, MD;  Location: [facility];  Service: Orthopedics;  Laterality: Right;  . LEFT HEART CATHETERIZATION WITH CORONARY ANGIOGRAM N/A 01/11/2012   Procedure: LEFT HEART CATHETERIZATION WITH CORONARY ANGIOGRAM;  Surgeon: Sherren Mocha, MD;  Location: [facility] CATH LAB;  Service: Cardiovascular;  Laterality: N/A;  widely patent coronary arterires without significant obstructive CAD,  normal LVF, ef 55-56%  . TONSILLECTOMY AND ADENOIDECTOMY  child  . TRANSTHORACIC ECHOCARDIOGRAM  01/18/2012   mild LVH,  ef 60%/  trivial TR  . TUBAL LIGATION  1985    There were no vitals filed for this visit.  Subjective Assessment - 12/28/18 1328    Subjective  The patient has had chronic low back pain but reports a significant exacerbation after an extended hospital stay due to to liver and kidney problems.  Her pain is rated at  a 8/10 with pain increasing with lifting and walking.  Rest decreases her pain.      Pertinent History  Left Achilles repair, prior lumbar compression fracture, IBS, right total knee replacement.    How long can you sit comfortably?  6 minutes.    How long can you walk comfortably?  Short community distances.    Patient Stated Goals  Reduce pain.    Currently in Pain?  Yes    Pain Score  8     Pain Location  Back    Pain Orientation  Right;Lower    Pain Descriptors / Indicators  Aching;Throbbing    Pain Type  Chronic pain    Pain Onset  More than a month ago     Pain Frequency  Constant    Aggravating Factors   See above.    Pain Relieving Factors  See above.         [facility] PT Assessment - 12/28/18 0001      Assessment   Medical Diagnosis  Degeration of lumbar invertebral disc.    Referring Provider (PT)  Suella Broad MD    Onset Date/Surgical Date  --   Ongoing.     Precautions   Precautions  None      Restrictions   Weight Bearing Restrictions  No      Balance Screen   Has the patient fallen in the past 6 months  Yes    How many times?  --   1.   Has the patient had a decrease in activity level because of a fear of falling?   No    Is the patient reluctant to leave their home because of a fear of falling?   No      Home Environment   Living Environment  Private residence      Prior Function   Level of Independence  Independent      Observation/Other Assessments   Focus on Therapeutic Outcomes (FOTO)   70% limitation.      Posture/Postural Control   Posture/Postural Control  Postural limitations    Postural Limitations  Rounded Shoulders;Forward head;Decreased lumbar lordosis;Increased thoracic kyphosis    Posture Comments  Lumbar scoliosis.      ROM / Strength   AROM / PROM / Strength  AROM;Strength      AROM   Overall AROM Comments  Active lumbar flexion is limited by 50% and lumbar extension= 10 degrees.      Strength   Overall Strength Comments  Normal bilateral LE strength.      Palpation   Palpation comment  Patient tender and very taut to palpation over lumbar paraspinal musculature and QL.        Transfers   Comments  Needed instruct in log roll technique.      Ambulation/Gait   Gait Comments  The patient walks in trunk flexion and she appears to be in obvious pain.                   [facility] Adult PT Treatment/Exercise - 12/28/18 0001      Modalities   Modalities  Electrical Stimulation;Moist Heat      Moist Heat Therapy   Number Minutes Moist Heat  15 Minutes    Moist Heat Location   Lumbar Spine      Electrical Stimulation   Electrical Stimulation Location  Right low back.    Electrical Stimulation Action  Pre-mod.    Electrical Stimulation Parameters  80-150 Hz  x 15 minutes.    Electrical Stimulation Goals  Pain             PT Education - 12/28/18 1351    Education provided  Yes    Education Details  [place]    Person(s) Educated  Patient    Methods  Explanation;Demonstration    Comprehension  Verbalized understanding       PT Short Term Goals - 03/29/18 1128      PT SHORT TERM GOAL #1   Title  STG's=LTG's.        PT Long Term Goals - 04/19/18 0909      PT LONG TERM GOAL #1   Title  Ind with HEP.    Time  6    Period  Weeks    Status  On-going      PT LONG TERM GOAL #2   Title  Full right active knee extension in order to normalize gait.    Time  6    Period  Weeks    Status  On-going      PT LONG TERM GOAL #3   Title  Active knee flexion to 115 degrees+ so the patient can perform functional tasks and do so with pain not > 2-3/10.    Time  6    Status  On-going      PT LONG TERM GOAL #4   Title  Increase right knee strength to a solid 4+/5 to provide good stability for accomplishment of functional activities.    Time  6    Period  Weeks    Status  On-going      PT LONG TERM GOAL #5   Title  Decrease edema to within 2.5 cms of non-affected side to assist with pain reduction and range of motion gains.    Time  6    Period  Weeks    Status  On-going      PT LONG TERM GOAL #6   Title  Perform a reciprocating stair gait with one railing with pain not > 2-3/10.    Baseline  step to pattern     Time  6    Period  Weeks    Status  On-going            Plan - 12/28/18 1354    Clinical Impression Statement  The patient presents to OPPT with c/o chronic low back pain and a recent exacerbation from an extended hospital stay.  She is very tender to palpation and very taut over her right lower lumbar musculature.  She has limitation  of lumbar range of motion and her functional mobility is limited also.  Lumbar scoliosis is noted as well.  Patient will benefit from skilled physical therapy intervention to address deficits and pain.    History and Personal Factors relevant to plan of care:  Left Achilles repair, prior lumbar compression fracture, IBS, right total knee replacement.    Clinical Presentation  Evolving    Clinical Presentation due to:  Not improving.    Clinical Decision Making  Low    Rehab Potential  Good    PT Frequency  2x / week    PT Duration  8 weeks    PT Treatment/Interventions  ADLs/Self Care Home Management;Cryotherapy;Electrical Stimulation;Ultrasound;Moist Heat;Therapeutic activities;Therapeutic exercise;Patient/family education;Manual techniques;Passive range of motion    PT Next Visit Plan  S and [facility], hip bridges, Combo e'stim/U/S to right low back, STW/M HMP/e'stim.    Consulted and Agree with Plan  of Care  Patient       Patient will benefit from skilled therapeutic intervention in order to improve the following deficits and impairments:  Decreased activity tolerance, Pain, Decreased range of motion, Increased muscle spasms  Visit Diagnosis: Abnormal posture - Plan: PT plan of care cert/re-cert  Chronic right-sided low back pain, unspecified whether sciatica present - Plan: PT plan of care cert/re-cert     Problem List Patient Active Problem List   Diagnosis Date Noted  . Degeneration of lumbar intervertebral disc 08/06/2018  . Secondary esophageal varices without bleeding ([place]) 06/03/2018  . Encephalopathy, hepatic ([place]) 05/13/2018  . History of paroxysmal supraventricular tachycardia   . Liver cirrhosis secondary to NASH ([place]) 04/23/2018  . Irritable bowel syndrome-diarrhea predominant 06/15/2017  . Gastroesophageal reflux disease 06/15/2017  . Hyperlipidemia   . Kidney stone   . Essential hypertension 02/13/2010    Verity Gilcrest, [place] MPT 12/28/2018, 2:40 PM  [facility] [address] [place], [place], [address] Phone: [phone]   Fax:  [phone]  Name: Dawn Moon MRN: [id number] Date of Birth: September 15, 1956

## 2018-12-30 ENCOUNTER — Ambulatory Visit: Payer: 59 | Admitting: Physical Therapy

## 2018-12-30 DIAGNOSIS — G8929 Other chronic pain: Secondary | ICD-10-CM

## 2018-12-30 DIAGNOSIS — R293 Abnormal posture: Secondary | ICD-10-CM | POA: Diagnosis not present

## 2018-12-30 DIAGNOSIS — M545 Low back pain: Principal | ICD-10-CM

## 2018-12-30 NOTE — Therapy (Signed)
[facility] [place], [place], [address] Phone: [phone]   Fax:  [phone]  Physical Therapy Treatment  Patient Details  Name: GENEVIEVE ARBAUGH MRN: [id number] Date of Birth: Aug 05, 1956 Referring Provider (PT): Suella Broad MD   Encounter Date: 12/30/2018  PT End of Session - 12/30/18 1028    Visit Number  2    Number of Visits  12    Date for PT Re-Evaluation  02/08/19    Authorization Type  FOTO AT LEAST EVERY 5TH VISIT, 10TH VISIT PROGRESS NOTE AND KX MODIFIER AFTER THE 15 VISIT.    PT Start Time  0945    PT Stop Time  1039    PT Time Calculation (min)  54 min    Activity Tolerance  Patient tolerated treatment well    Behavior During Therapy  WFL for tasks assessed/performed       Past Medical History:  Diagnosis Date  . Ascites   . Encephalopathy, hepatic ([place]) 05/13/2018  . GERD (gastroesophageal reflux disease)   . History of exercise stress test    03-07-2010  Stress echo--- no arrhythmias or conduction abnormalilites and negative for ischemia or chest pain  . History of kidney stones   . History of paroxysmal supraventricular tachycardia    episode 2011  consult w/ dr Caryl Comes --  put on atenolol--  per pt no longer an issue  . HTN (hypertension)    cardiologist --  dr Stanford Breed  . IBS (irritable bowel syndrome)    diarrhea  . Nonalcoholic steatohepatitis (NASH)   . Numbness and tingling of left lower extremity    post achilles tendon repair  . OA (osteoarthritis)    knees  . Pleural effusion on right    hepatic hydrothorax  . PONV (postoperative nausea and vomiting)   . Secondary esophageal varices without bleeding ([place]) 06/03/2018  . Vitamin D deficiency   . Wears glasses     Past Surgical History:  Procedure Laterality Date  . ACHILLES TENDON SURGERY Left 06/2016  . BREAST BIOPSY Left 02/2016   benign  . COLONOSCOPY  2005  . CT CTA CORONARY W/CA SCORE W/CM &/OR WO/CM  01/01/2014   non-obstructive calcified plaque in pLAD (0-25%), no significant incidental noncardiac findings noted  . ESOPHAGOGASTRODUODENOSCOPY  01/2014  . EXTRACORPOREAL SHOCK WAVE LITHOTRIPSY  2010  . KNEE ARTHROPLASTY Right 03/24/2018   Procedure: RIGHT TOTAL KNEE ARTHROPLASTY WITH COMPUTER NAVIGATION;  Surgeon: Rod Can, MD;  Location: [facility];  Service: Orthopedics;  Laterality: Right;  Needs RNFA  . KNEE ARTHROSCOPY Right 12/04/2016   Procedure: ARTHROSCOPY KNEE WITH PARTIAL MEDIAL MENISCECTOMY;  Surgeon: Rod Can, MD;  Location: [facility];  Service: Orthopedics;  Laterality: Right;  . LEFT HEART CATHETERIZATION WITH CORONARY ANGIOGRAM N/A 01/11/2012   Procedure: LEFT HEART CATHETERIZATION WITH CORONARY ANGIOGRAM;  Surgeon: Sherren Mocha, MD;  Location: [facility] CATH LAB;  Service: Cardiovascular;  Laterality: N/A;  widely patent coronary arterires without significant obstructive CAD,  normal LVF, ef 55-56%  . TONSILLECTOMY AND ADENOIDECTOMY  child  . TRANSTHORACIC ECHOCARDIOGRAM  01/18/2012   mild LVH,  ef 60%/  trivial TR  . TUBAL LIGATION  1985    There were no vitals filed for this visit.  Subjective Assessment - 12/30/18 1024    Subjective  No new complaints.    Currently in Pain?  Yes    Pain Score  8     Pain Location  Back    Pain Orientation  Right;Lower    Pain Descriptors / Indicators  Aching;Throbbing    Pain Type  Chronic pain    Pain Onset  More than a month ago                       [facility] Adult PT Treatment/Exercise - 12/30/18 0001      Modalities   Modalities  Electrical Stimulation;Moist Heat;Ultrasound      Moist Heat Therapy   Number Minutes Moist Heat  20 Minutes    Moist Heat Location  Lumbar Spine      Electrical Stimulation   Electrical Stimulation Location  Right low back.    Electrical Stimulation Action  Pre-mod.    Electrical Stimulation Parameters  80-150 Hz x 20 minutes.    Electrical Stimulation Goals  Pain       Ultrasound   Ultrasound Location  Right low back.    Ultrasound Parameters  Combo e'stim/U/S at 1.50 W/CM2 x 12 minutes.    Ultrasound Goals  Pain      Manual Therapy   Manual Therapy  Soft tissue mobilization    Soft tissue mobilization  STW/M x 11 minutes to right low back including QL release technique.               PT Short Term Goals - 03/29/18 1128      PT SHORT TERM GOAL #1   Title  STG's=LTG's.        PT Long Term Goals - 04/19/18 0909      PT LONG TERM GOAL #1   Title  Ind with HEP.    Time  6    Period  Weeks    Status  On-going      PT LONG TERM GOAL #2   Title  Full right active knee extension in order to normalize gait.    Time  6    Period  Weeks    Status  On-going      PT LONG TERM GOAL #3   Title  Active knee flexion to 115 degrees+ so the patient can perform functional tasks and do so with pain not > 2-3/10.    Time  6    Status  On-going      PT LONG TERM GOAL #4   Title  Increase right knee strength to a solid 4+/5 to provide good stability for accomplishment of functional activities.    Time  6    Period  Weeks    Status  On-going      PT LONG TERM GOAL #5   Title  Decrease edema to within 2.5 cms of non-affected side to assist with pain reduction and range of motion gains.    Time  6    Period  Weeks    Status  On-going      PT LONG TERM GOAL #6   Title  Perform a reciprocating stair gait with one railing with pain not > 2-3/10.    Baseline  step to pattern     Time  6    Period  Weeks    Status  On-going            Plan - 12/30/18 1026    Clinical Impression Statement  Patient responded well to treatment.  Right QL very taut to palpation but responding well to STW/M.    PT Treatment/Interventions  ADLs/Self Care Home Management;Cryotherapy;Electrical Stimulation;Ultrasound;Moist Heat;Therapeutic activities;Therapeutic exercise;Patient/family education;Manual techniques;Passive range of motion  PT Next Visit Plan  S  and [facility], hip bridges, Combo e'stim/U/S to right low back, STW/M HMP/e'stim.    Consulted and Agree with Plan of Care  Patient       Patient will benefit from skilled therapeutic intervention in order to improve the following deficits and impairments:  Decreased activity tolerance, Pain, Decreased range of motion, Increased muscle spasms  Visit Diagnosis: Chronic right-sided low back pain, unspecified whether sciatica present  Abnormal posture     Problem List Patient Active Problem List   Diagnosis Date Noted  . Degeneration of lumbar intervertebral disc 08/06/2018  . Secondary esophageal varices without bleeding ([place]) 06/03/2018  . Encephalopathy, hepatic ([place]) 05/13/2018  . History of paroxysmal supraventricular tachycardia   . Liver cirrhosis secondary to NASH ([place]) 04/23/2018  . Irritable bowel syndrome-diarrhea predominant 06/15/2017  . Gastroesophageal reflux disease 06/15/2017  . Hyperlipidemia   . Kidney stone   . Essential hypertension 02/13/2010    Charnise Lovan, [place] MPT 12/30/2018, 11:03 AM  [facility] [address] [place], [place], [address] Phone: [phone]   Fax:  [phone]  Name: SHAKARIA RAPHAEL MRN: [id number] Date of Birth: 02-15-1956

## 2019-01-02 ENCOUNTER — Ambulatory Visit: Payer: 59 | Admitting: Physical Therapy

## 2019-01-02 ENCOUNTER — Encounter: Payer: Self-pay | Admitting: Physical Therapy

## 2019-01-02 DIAGNOSIS — M545 Low back pain: Principal | ICD-10-CM

## 2019-01-02 DIAGNOSIS — R293 Abnormal posture: Secondary | ICD-10-CM | POA: Diagnosis not present

## 2019-01-02 DIAGNOSIS — G8929 Other chronic pain: Secondary | ICD-10-CM

## 2019-01-02 NOTE — Therapy (Signed)
[facility] [place], [place], [address] Phone: [phone]   Fax:  [phone]  Physical Therapy Treatment  Patient Details  Name: Dawn Moon MRN: [id number] Date of Birth: 1956/06/04 Referring Provider (PT): Suella Broad MD   Encounter Date: 01/02/2019  PT End of Session - 01/02/19 1118    Visit Number  3    Number of Visits  12    Date for PT Re-Evaluation  02/08/19    Authorization Type  FOTO AT LEAST EVERY 5TH VISIT, 10TH VISIT PROGRESS NOTE AND KX MODIFIER AFTER THE 15 VISIT.    PT Start Time  1118    PT Stop Time  1208    PT Time Calculation (min)  50 min    Activity Tolerance  Patient tolerated treatment well    Behavior During Therapy  WFL for tasks assessed/performed       Past Medical History:  Diagnosis Date  . Ascites   . Encephalopathy, hepatic ([place]) 05/13/2018  . GERD (gastroesophageal reflux disease)   . History of exercise stress test    03-07-2010  Stress echo--- no arrhythmias or conduction abnormalilites and negative for ischemia or chest pain  . History of kidney stones   . History of paroxysmal supraventricular tachycardia    episode 2011  consult w/ dr Caryl Comes --  put on atenolol--  per pt no longer an issue  . HTN (hypertension)    cardiologist --  dr Stanford Breed  . IBS (irritable bowel syndrome)    diarrhea  . Nonalcoholic steatohepatitis (NASH)   . Numbness and tingling of left lower extremity    post achilles tendon repair  . OA (osteoarthritis)    knees  . Pleural effusion on right    hepatic hydrothorax  . PONV (postoperative nausea and vomiting)   . Secondary esophageal varices without bleeding ([place]) 06/03/2018  . Vitamin D deficiency   . Wears glasses     Past Surgical History:  Procedure Laterality Date  . ACHILLES TENDON SURGERY Left 06/2016  . BREAST BIOPSY Left 02/2016   benign  . COLONOSCOPY  2005  . CT CTA CORONARY W/CA SCORE W/CM &/OR WO/CM  01/01/2014   non-obstructive calcified plaque in pLAD (0-25%), no significant incidental noncardiac findings noted  . ESOPHAGOGASTRODUODENOSCOPY  01/2014  . EXTRACORPOREAL SHOCK WAVE LITHOTRIPSY  2010  . KNEE ARTHROPLASTY Right 03/24/2018   Procedure: RIGHT TOTAL KNEE ARTHROPLASTY WITH COMPUTER NAVIGATION;  Surgeon: Rod Can, MD;  Location: [facility];  Service: Orthopedics;  Laterality: Right;  Needs RNFA  . KNEE ARTHROSCOPY Right 12/04/2016   Procedure: ARTHROSCOPY KNEE WITH PARTIAL MEDIAL MENISCECTOMY;  Surgeon: Rod Can, MD;  Location: [place];  Service: Orthopedics;  Laterality: Right;  . LEFT HEART CATHETERIZATION WITH CORONARY ANGIOGRAM N/A 01/11/2012   Procedure: LEFT HEART CATHETERIZATION WITH CORONARY ANGIOGRAM;  Surgeon: Sherren Mocha, MD;  Location: [facility] CATH LAB;  Service: Cardiovascular;  Laterality: N/A;  widely patent coronary arterires without significant obstructive CAD,  normal LVF, ef 55-56%  . TONSILLECTOMY AND ADENOIDECTOMY  child  . TRANSTHORACIC ECHOCARDIOGRAM  01/18/2012   mild LVH,  ef 60%/  trivial TR  . TUBAL LIGATION  1985    There were no vitals filed for this visit.  Subjective Assessment - 01/02/19 1113    Subjective  Reports getting a new office chair and that that has really helped. Brought in her TENS unit for instruction as well. Reports balance issues as well.    Pertinent History  Left Achilles repair, prior lumbar compression fracture, IBS, right total knee replacement.    How long can you sit comfortably?  6 minutes.    How long can you walk comfortably?  Short community distances.    Patient Stated Goals  Reduce pain.    Currently in Pain?  Yes    Pain Score  2     Pain Location  Flank    Pain Orientation  Right;Lower    Pain Descriptors / Indicators  Discomfort    Pain Type  Chronic pain    Pain Onset  More than a month ago         [facility] PT Assessment - 01/02/19 0001      Assessment   Medical Diagnosis  Degeration of lumbar  invertebral disc.    Referring Provider (PT)  Suella Broad MD    Onset Date/Surgical Date  03/24/18    Next MD Visit  01/2019      Precautions   Precautions  None      Restrictions   Weight Bearing Restrictions  No                   [facility] Adult PT Treatment/Exercise - 01/02/19 0001      Exercises   Exercises  Lumbar      Lumbar Exercises: Stretches   Passive Hamstring Stretch  Right;3 reps;30 seconds    Single Knee to Chest Stretch  Right;3 reps;30 seconds    Figure 4 Stretch  3 reps;30 seconds;Supine;Without overpressure   RLE     Lumbar Exercises: Supine   Ab Set  15 reps;5 seconds    Bent Knee Raise  5 reps   limited due to pain beginning     Modalities   Modalities  Electrical Stimulation;Moist Heat      Moist Heat Therapy   Number Minutes Moist Heat  15 Minutes    Moist Heat Location  Lumbar Spine      Electrical Stimulation   Electrical Stimulation Location  Right low back.    Electrical Stimulation Action  Pre-Mod    Electrical Stimulation Parameters  80-150 hz x15 min    Electrical Stimulation Goals  Pain;Tone      Manual Therapy   Manual Therapy  Soft tissue mobilization    Soft tissue mobilization  STW to R lumbar paraspinals and QL to reduce pain and muscle tightness               PT Short Term Goals - 03/29/18 1128      PT SHORT TERM GOAL #1   Title  STG's=LTG's.        PT Long Term Goals - 04/19/18 0909      PT LONG TERM GOAL #1   Title  Ind with HEP.    Time  6    Period  Weeks    Status  On-going      PT LONG TERM GOAL #2   Title  Full right active knee extension in order to normalize gait.    Time  6    Period  Weeks    Status  On-going      PT LONG TERM GOAL #3   Title  Active knee flexion to 115 degrees+ so the patient can perform functional tasks and do so with pain not > 2-3/10.    Time  6    Status  On-going      PT LONG TERM GOAL #4   Title  Increase  right knee strength to a solid 4+/5 to provide good  stability for accomplishment of functional activities.    Time  6    Period  Weeks    Status  On-going      PT LONG TERM GOAL #5   Title  Decrease edema to within 2.5 cms of non-affected side to assist with pain reduction and range of motion gains.    Time  6    Period  Weeks    Status  On-going      PT LONG TERM GOAL #6   Title  Perform a reciprocating stair gait with one railing with pain not > 2-3/10.    Baseline  step to pattern     Time  6    Period  Weeks    Status  On-going            Plan - 01/02/19 1203    Clinical Impression Statement  Patient arrived in clinic with low level R LBP today and reports improvement since receiving new office chair as a christmas gift. Low level RLE and low back stretches completed without complaint of increased pain. Patient aware that she should do more home HS stretches. Patient educated regarding proper core activation technique but exercises stopped after increased R LBP began with marching and core. Increased muscle tightness palpable during manual therapy session and patient reported burning and stinging during manual therapy. Patient transitioned with log rolling technique. Patient educated regarding use of her TENS unit and set up. Patient reported less pain but still aware of discomfort upon end of treatment and able to stand with less pain.    Rehab Potential  Good    PT Frequency  2x / week    PT Duration  8 weeks    PT Treatment/Interventions  ADLs/Self Care Home Management;Cryotherapy;Electrical Stimulation;Ultrasound;Moist Heat;Therapeutic activities;Therapeutic exercise;Patient/family education;Manual techniques;Passive range of motion    PT Next Visit Plan  S and DTKC, hip bridges, Combo e'stim/U/S to right low back, STW/M HMP/e'stim.    Consulted and Agree with Plan of Care  Patient       Patient will benefit from skilled therapeutic intervention in order to improve the following deficits and impairments:  Decreased activity  tolerance, Pain, Decreased range of motion, Increased muscle spasms  Visit Diagnosis: Chronic right-sided low back pain, unspecified whether sciatica present  Abnormal posture     Problem List Patient Active Problem List   Diagnosis Date Noted  . Degeneration of lumbar intervertebral disc 08/06/2018  . Secondary esophageal varices without bleeding ([place]) 06/03/2018  . Encephalopathy, hepatic ([place]) 05/13/2018  . History of paroxysmal supraventricular tachycardia   . Liver cirrhosis secondary to NASH ([place]) 04/23/2018  . Irritable bowel syndrome-diarrhea predominant 06/15/2017  . Gastroesophageal reflux disease 06/15/2017  . Hyperlipidemia   . Kidney stone   . Essential hypertension 02/13/2010    Standley Brooking, PTA 01/02/2019, 12:15 PM  [facility] [address] [place], [place], [address] Phone: [phone]   Fax:  [phone]  Name: Dawn Moon MRN: [id number] Date of Birth: 08-May-1956

## 2019-01-04 ENCOUNTER — Encounter: Payer: 59 | Admitting: Physical Therapy

## 2019-01-05 ENCOUNTER — Encounter: Payer: Self-pay | Admitting: Physical Therapy

## 2019-01-05 ENCOUNTER — Ambulatory Visit: Payer: 59 | Admitting: Physical Therapy

## 2019-01-05 DIAGNOSIS — R293 Abnormal posture: Secondary | ICD-10-CM

## 2019-01-05 DIAGNOSIS — M545 Low back pain, unspecified: Secondary | ICD-10-CM

## 2019-01-05 DIAGNOSIS — G8929 Other chronic pain: Secondary | ICD-10-CM

## 2019-01-05 NOTE — Therapy (Signed)
[facility] [place], [place], [address] Phone: [phone]   Fax:  [phone]  Physical Therapy Treatment  Patient Details  Name: Dawn Moon MRN: [id number] Date of Birth: Aug 29, 1956 Referring Provider (PT): Suella Broad MD   Encounter Date: 01/05/2019  PT End of Session - 01/05/19 1157    Visit Number  4    Number of Visits  12    Date for PT Re-Evaluation  02/08/19    Authorization Type  FOTO AT LEAST EVERY 5TH VISIT, 10TH VISIT PROGRESS NOTE AND KX MODIFIER AFTER THE 15 VISIT.    PT Start Time  1117    PT Stop Time  1203    PT Time Calculation (min)  46 min    Activity Tolerance  Patient tolerated treatment well    Behavior During Therapy  WFL for tasks assessed/performed       Past Medical History:  Diagnosis Date  . Ascites   . Encephalopathy, hepatic ([place]) 05/13/2018  . GERD (gastroesophageal reflux disease)   . History of exercise stress test    03-07-2010  Stress echo--- no arrhythmias or conduction abnormalilites and negative for ischemia or chest pain  . History of kidney stones   . History of paroxysmal supraventricular tachycardia    episode 2011  consult w/ dr Caryl Comes --  put on atenolol--  per pt no longer an issue  . HTN (hypertension)    cardiologist --  dr Stanford Breed  . IBS (irritable bowel syndrome)    diarrhea  . Nonalcoholic steatohepatitis (NASH)   . Numbness and tingling of left lower extremity    post achilles tendon repair  . OA (osteoarthritis)    knees  . Pleural effusion on right    hepatic hydrothorax  . PONV (postoperative nausea and vomiting)   . Secondary esophageal varices without bleeding ([place]) 06/03/2018  . Vitamin D deficiency   . Wears glasses     Past Surgical History:  Procedure Laterality Date  . ACHILLES TENDON SURGERY Left 06/2016  . BREAST BIOPSY Left 02/2016   benign  . COLONOSCOPY  2005  . CT CTA CORONARY W/CA SCORE W/CM &/OR WO/CM  01/01/2014    non-obstructive calcified plaque in pLAD (0-25%), no significant incidental noncardiac findings noted  . ESOPHAGOGASTRODUODENOSCOPY  01/2014  . EXTRACORPOREAL SHOCK WAVE LITHOTRIPSY  2010  . KNEE ARTHROPLASTY Right 03/24/2018   Procedure: RIGHT TOTAL KNEE ARTHROPLASTY WITH COMPUTER NAVIGATION;  Surgeon: Rod Can, MD;  [facility];  Service: Orthopedics;  Laterality: Right;  Needs RNFA  . KNEE ARTHROSCOPY Right 12/04/2016   Procedure: ARTHROSCOPY KNEE WITH PARTIAL MEDIAL MENISCECTOMY;  Surgeon: Rod Can, MD;  Location: [place];  Service: Orthopedics;  Laterality: Right;  . LEFT HEART CATHETERIZATION WITH CORONARY ANGIOGRAM N/A 01/11/2012   Procedure: LEFT HEART CATHETERIZATION WITH CORONARY ANGIOGRAM;  Surgeon: Sherren Mocha, MD;  Location: [facility] CATH LAB;  Service: Cardiovascular;  Laterality: N/A;  widely patent coronary arterires without significant obstructive CAD,  normal LVF, ef 55-56%  . TONSILLECTOMY AND ADENOIDECTOMY  child  . TRANSTHORACIC ECHOCARDIOGRAM  01/18/2012   mild LVH,  ef 60%/  trivial TR  . TUBAL LIGATION  1985    There were no vitals filed for this visit.  Subjective Assessment - 01/05/19 1116    Subjective  Reports that she was very sore tuesday after last PT session and used her TENS unit throughout the day. Reports she is much better today.    Pertinent History  Left Achilles repair, prior lumbar compression fracture, IBS, right total knee replacement.    How long can you sit comfortably?  6 minutes.    How long can you walk comfortably?  Short community distances.    Patient Stated Goals  Reduce pain.    Currently in Pain?  Yes    Pain Score  2     Pain Location  Back    Pain Orientation  Right;Lower    Pain Descriptors / Indicators  Aching    Pain Type  Chronic pain    Pain Onset  More than a month ago         [facility] PT Assessment - 01/05/19 0001      Assessment   Medical Diagnosis  Degeration of lumbar invertebral disc.     Referring Provider (PT)  Suella Broad MD    Onset Date/Surgical Date  03/24/18    Next MD Visit  01/2019      Precautions   Precautions  None      Restrictions   Weight Bearing Restrictions  No                   [facility] Adult PT Treatment/Exercise - 01/05/19 0001      Lumbar Exercises: Stretches   Single Knee to Chest Stretch  Right;Left;3 reps;30 seconds      Lumbar Exercises: Supine   Ab Set  10 reps;5 seconds      Modalities   Modalities  Electrical Stimulation;Moist Heat      Moist Heat Therapy   Number Minutes Moist Heat  15 Minutes    Moist Heat Location  Lumbar Spine      Electrical Stimulation   Electrical Stimulation Location  Right low back.    Electrical Stimulation Action  Pre-Mod    Electrical Stimulation Parameters  80-150 hz x15 min    Electrical Stimulation Goals  Pain;Tone      Manual Therapy   Manual Therapy  Soft tissue mobilization    Soft tissue mobilization  STW to R lumbar paraspinals and QL to reduce pain and muscle tightness                         Plan - 01/05/19 1200    Clinical Impression Statement  Patient presented in clinic with reports of increased pain following previous PT session. Patient requested a lighter treatment as to avoid increased pain. Core activation and SKTC completed but limited to that as to avoid flare up. Patient intermittantly tender to manual therapy of R lumbar paraspinals, QL and flank region. Stinging and burning still present per patient report with manual therapy but eager to continue with manual therapy. Patient educated regarding lumbar rolls to be utilized in her recliner as well. Normal modalities response noted following removal of the modalities. Patient observed completed log rolling with all transition movements.    Rehab Potential  Good    PT Frequency  2x / week    PT Duration  8 weeks    PT Treatment/Interventions  ADLs/Self Care Home Management;Cryotherapy;Electrical  Stimulation;Ultrasound;Moist Heat;Therapeutic activities;Therapeutic exercise;Patient/family education;Manual techniques;Passive range of motion    PT Next Visit Plan  S and DTKC, hip bridges, Combo e'stim/U/S to right low back, STW/M HMP/e'stim.    Consulted and Agree with Plan of Care  Patient       Patient will benefit from skilled therapeutic intervention in order to improve the following deficits and impairments:  Decreased activity tolerance,  Pain, Decreased range of motion, Increased muscle spasms  Visit Diagnosis: Chronic right-sided low back pain, unspecified whether sciatica present  Abnormal posture     Problem List Patient Active Problem List   Diagnosis Date Noted  . Degeneration of lumbar intervertebral disc 08/06/2018  . Secondary esophageal varices without bleeding ([place]) 06/03/2018  . Encephalopathy, hepatic ([place]) 05/13/2018  . History of paroxysmal supraventricular tachycardia   . Liver cirrhosis secondary to NASH ([place]) 04/23/2018  . Irritable bowel syndrome-diarrhea predominant 06/15/2017  . Gastroesophageal reflux disease 06/15/2017  . Hyperlipidemia   . Kidney stone   . Essential hypertension 02/13/2010    Standley Brooking, PTA 01/05/2019, 12:06 PM  [facility] [address] [place], [place], [address] Phone: [phone]   Fax:  [phone]  Name: Dawn Moon MRN: [id number] Date of Birth: [birth year]

## 2019-01-06 ENCOUNTER — Ambulatory Visit: Payer: 59 | Admitting: Physical Therapy

## 2019-01-06 ENCOUNTER — Encounter: Payer: Self-pay | Admitting: Physical Therapy

## 2019-01-06 DIAGNOSIS — R293 Abnormal posture: Secondary | ICD-10-CM | POA: Diagnosis not present

## 2019-01-06 DIAGNOSIS — M545 Low back pain: Principal | ICD-10-CM

## 2019-01-06 DIAGNOSIS — G8929 Other chronic pain: Secondary | ICD-10-CM

## 2019-01-06 NOTE — Therapy (Signed)
[facility] [place], [place], [address] Phone: [phone]   Fax:  [phone]  Physical Therapy Treatment  Patient Details  Name: Dawn Moon MRN: [id number] Date of Birth: 01-31-1956 Referring Provider (PT): Suella Broad MD   Encounter Date: 01/06/2019  PT End of Session - 01/06/19 1114    Visit Number  5    Number of Visits  12    Date for PT Re-Evaluation  02/08/19    Authorization Type  FOTO AT LEAST EVERY 5TH VISIT, 10TH VISIT PROGRESS NOTE AND KX MODIFIER AFTER THE 15 VISIT.    PT Start Time  1121    PT Stop Time  1203    PT Time Calculation (min)  42 min    Activity Tolerance  Patient tolerated treatment well    Behavior During Therapy  WFL for tasks assessed/performed       Past Medical History:  Diagnosis Date  . Ascites   . Encephalopathy, hepatic ([place]) 05/13/2018  . GERD (gastroesophageal reflux disease)   . History of exercise stress test    03-07-2010  Stress echo--- no arrhythmias or conduction abnormalilites and negative for ischemia or chest pain  . History of kidney stones   . History of paroxysmal supraventricular tachycardia    episode 2011  consult w/ dr Caryl Comes --  put on atenolol--  per pt no longer an issue  . HTN (hypertension)    cardiologist --  dr Stanford Breed  . IBS (irritable bowel syndrome)    diarrhea  . Nonalcoholic steatohepatitis (NASH)   . Numbness and tingling of left lower extremity    post achilles tendon repair  . OA (osteoarthritis)    knees  . Pleural effusion on right    hepatic hydrothorax  . PONV (postoperative nausea and vomiting)   . Secondary esophageal varices without bleeding ([place]) 06/03/2018  . Vitamin D deficiency   . Wears glasses     Past Surgical History:  Procedure Laterality Date  . ACHILLES TENDON SURGERY Left 06/2016  . BREAST BIOPSY Left 02/2016   benign  . COLONOSCOPY  2005  . CT CTA CORONARY W/CA SCORE W/CM &/OR WO/CM  01/01/2014   non-obstructive calcified plaque in pLAD (0-25%), no significant incidental noncardiac findings noted  . ESOPHAGOGASTRODUODENOSCOPY  01/2014  . EXTRACORPOREAL SHOCK WAVE LITHOTRIPSY  2010  . KNEE ARTHROPLASTY Right 03/24/2018   Procedure: RIGHT TOTAL KNEE ARTHROPLASTY WITH COMPUTER NAVIGATION;  Surgeon: Rod Can, MD;  [facility];  Service: Orthopedics;  Laterality: Right;  Needs RNFA  . KNEE ARTHROSCOPY Right 12/04/2016   Procedure: ARTHROSCOPY KNEE WITH PARTIAL MEDIAL MENISCECTOMY;  Surgeon: Rod Can, MD;  Location: [place];  Service: Orthopedics;  Laterality: Right;  . LEFT HEART CATHETERIZATION WITH CORONARY ANGIOGRAM N/A 01/11/2012   Procedure: LEFT HEART CATHETERIZATION WITH CORONARY ANGIOGRAM;  Surgeon: Sherren Mocha, MD;  Location: [facility] CATH LAB;  Service: Cardiovascular;  Laterality: N/A;  widely patent coronary arterires without significant obstructive CAD,  normal LVF, ef 55-56%  . TONSILLECTOMY AND ADENOIDECTOMY  child  . TRANSTHORACIC ECHOCARDIOGRAM  01/18/2012   mild LVH,  ef 60%/  trivial TR  . TUBAL LIGATION  1985    There were no vitals filed for this visit.  Subjective Assessment - 01/06/19 1114    Subjective  Reports her low back being sore but not as bad as it was earlier this week.    Pertinent History  Left Achilles repair, prior lumbar compression fracture, IBS, right total  knee replacement.    How long can you sit comfortably?  6 minutes.    How long can you walk comfortably?  Short community distances.    Patient Stated Goals  Reduce pain.    Currently in Pain?  Yes    Pain Score  3     Pain Location  Back    Pain Orientation  Right;Lower    Pain Descriptors / Indicators  Other (Comment)   twinging   Pain Type  Chronic pain    Pain Onset  More than a month ago         [facility] PT Assessment - 01/06/19 0001      Assessment   Medical Diagnosis  Degeration of lumbar invertebral disc.    Referring Provider (PT)  Suella Broad  MD    Onset Date/Surgical Date  03/24/18    Next MD Visit  01/2019      Precautions   Precautions  None      Restrictions   Weight Bearing Restrictions  No                   [facility] Adult PT Treatment/Exercise - 01/06/19 0001      Lumbar Exercises: Stretches   Single Knee to Chest Stretch  Right;Left;3 reps;30 seconds      Lumbar Exercises: Supine   Ab Set  10 reps;5 seconds      Modalities   Modalities  Electrical Stimulation;Moist Heat      Moist Heat Therapy   Number Minutes Moist Heat  15 Minutes    Moist Heat Location  Lumbar Spine      Electrical Stimulation   Electrical Stimulation Location  Right low back.    Electrical Stimulation Action  Pre-Mod    Electrical Stimulation Parameters  80-150 hz x15 min    Electrical Stimulation Goals  Pain;Tone      Manual Therapy   Manual Therapy  Soft tissue mobilization    Soft tissue mobilization  STW to R lumbar paraspinals and QL to reduce pain and muscle tightness               PT Short Term Goals - 03/29/18 1128      PT SHORT TERM GOAL #1   Title  STG's=LTG's.        PT Long Term Goals - 01/05/19 1210      PT LONG TERM GOAL #1   Title  Ind with HEP.    Time  6    Period  Weeks    Status  New      PT LONG TERM GOAL #2   Title  Perform ADL's with pain not > 3/10.            Plan - 01/06/19 1216    Clinical Impression Statement  Patient tolerated today's treatment fairly well as she reported soreness in low back upon arrival as well as twinges of pain. Patient instructed again to complete light strengthening and core activation. Patient still tender to palpation of R flank and low back. Increased muscle tightness palpable along R lumbar parapsinals. Inflammation noted in R low back as well. Normal modalities response noted following removal of the modalities.    Rehab Potential  Good    PT Frequency  2x / week    PT Duration  8 weeks    PT Treatment/Interventions  ADLs/Self Care Home  Management;Cryotherapy;Electrical Stimulation;Ultrasound;Moist Heat;Therapeutic activities;Therapeutic exercise;Patient/family education;Manual techniques;Passive range of motion    PT Next Visit  Plan  S and DTKC, hip bridges, Combo e'stim/U/S to right low back, STW/M HMP/e'stim.    Consulted and Agree with Plan of Care  Patient       Patient will benefit from skilled therapeutic intervention in order to improve the following deficits and impairments:  Decreased activity tolerance, Pain, Decreased range of motion, Increased muscle spasms  Visit Diagnosis: Chronic right-sided low back pain, unspecified whether sciatica present  Abnormal posture     Problem List Patient Active Problem List   Diagnosis Date Noted  . Degeneration of lumbar intervertebral disc 08/06/2018  . Secondary esophageal varices without bleeding ([place]) 06/03/2018  . Encephalopathy, hepatic ([place]) 05/13/2018  . History of paroxysmal supraventricular tachycardia   . Liver cirrhosis secondary to NASH ([place]) 04/23/2018  . Irritable bowel syndrome-diarrhea predominant 06/15/2017  . Gastroesophageal reflux disease 06/15/2017  . Hyperlipidemia   . Kidney stone   . Essential hypertension 02/13/2010    Standley Brooking, PTA 01/06/2019, 12:23 PM  [facility] [address] [place], [place], [address] Phone: [phone]   Fax:  [phone]  Name: VENERA PRIVOTT MRN: [id number] Date of Birth: May 22, 1956

## 2019-01-09 ENCOUNTER — Ambulatory Visit (INDEPENDENT_AMBULATORY_CARE_PROVIDER_SITE_OTHER): Payer: 59 | Admitting: Internal Medicine

## 2019-01-09 ENCOUNTER — Ambulatory Visit: Payer: 59 | Admitting: Physical Therapy

## 2019-01-09 ENCOUNTER — Encounter: Payer: Self-pay | Admitting: Physical Therapy

## 2019-01-09 ENCOUNTER — Other Ambulatory Visit: Payer: Self-pay | Admitting: Internal Medicine

## 2019-01-09 DIAGNOSIS — M545 Low back pain: Principal | ICD-10-CM

## 2019-01-09 DIAGNOSIS — G8929 Other chronic pain: Secondary | ICD-10-CM

## 2019-01-09 DIAGNOSIS — K7581 Nonalcoholic steatohepatitis (NASH): Secondary | ICD-10-CM | POA: Diagnosis not present

## 2019-01-09 DIAGNOSIS — Z23 Encounter for immunization: Secondary | ICD-10-CM | POA: Diagnosis not present

## 2019-01-09 DIAGNOSIS — K7460 Unspecified cirrhosis of liver: Secondary | ICD-10-CM

## 2019-01-09 DIAGNOSIS — R293 Abnormal posture: Secondary | ICD-10-CM

## 2019-01-09 NOTE — Therapy (Signed)
[facility] [place], [place], [address] Phone: [phone]   Fax:  [phone]  Physical Therapy Treatment  Patient Details  Name: Dawn Moon MRN: [id number] Date of Birth: Feb 16, 1956 Referring Provider (PT): Suella Broad MD   Encounter Date: 01/09/2019  PT End of Session - 01/09/19 1420    Visit Number  6    Number of Visits  12    Date for PT Re-Evaluation  02/08/19    Authorization Type  FOTO AT LEAST EVERY 5TH VISIT, 10TH VISIT PROGRESS NOTE AND KX MODIFIER AFTER THE 15 VISIT.    PT Start Time  1347    PT Stop Time  1433    PT Time Calculation (min)  46 min    Activity Tolerance  Patient tolerated treatment well;Patient limited by pain    Behavior During Therapy  [facility] for tasks assessed/performed       Past Medical History:  Diagnosis Date  . Ascites   . Encephalopathy, hepatic ([place]) 05/13/2018  . GERD (gastroesophageal reflux disease)   . History of exercise stress test    03-07-2010  Stress echo--- no arrhythmias or conduction abnormalilites and negative for ischemia or chest pain  . History of kidney stones   . History of paroxysmal supraventricular tachycardia    episode 2011  consult w/ dr Caryl Comes --  put on atenolol--  per pt no longer an issue  . HTN (hypertension)    cardiologist --  dr Stanford Breed  . IBS (irritable bowel syndrome)    diarrhea  . Nonalcoholic steatohepatitis (NASH)   . Numbness and tingling of left lower extremity    post achilles tendon repair  . OA (osteoarthritis)    knees  . Pleural effusion on right    hepatic hydrothorax  . PONV (postoperative nausea and vomiting)   . Secondary esophageal varices without bleeding ([place]) 06/03/2018  . Vitamin D deficiency   . Wears glasses     Past Surgical History:  Procedure Laterality Date  . ACHILLES TENDON SURGERY Left 06/2016  . BREAST BIOPSY Left 02/2016   benign  . COLONOSCOPY  2005  . CT CTA CORONARY W/CA SCORE W/CM &/OR WO/CM   01/01/2014   non-obstructive calcified plaque in pLAD (0-25%), no significant incidental noncardiac findings noted  . ESOPHAGOGASTRODUODENOSCOPY  01/2014  . EXTRACORPOREAL SHOCK WAVE LITHOTRIPSY  2010  . KNEE ARTHROPLASTY Right 03/24/2018   Procedure: RIGHT TOTAL KNEE ARTHROPLASTY WITH COMPUTER NAVIGATION;  Surgeon: Rod Can, MD;  Location: [facility];  Service: Orthopedics;  Laterality: Right;  Needs RNFA  . KNEE ARTHROSCOPY Right 12/04/2016   Procedure: ARTHROSCOPY KNEE WITH PARTIAL MEDIAL MENISCECTOMY;  Surgeon: Rod Can, MD;  Location: [place];  Service: Orthopedics;  Laterality: Right;  . LEFT HEART CATHETERIZATION WITH CORONARY ANGIOGRAM N/A 01/11/2012   Procedure: LEFT HEART CATHETERIZATION WITH CORONARY ANGIOGRAM;  Surgeon: Sherren Mocha, MD;  Location: [facility] CATH [facility];  Service: Cardiovascular;  Laterality: N/A;  widely patent coronary arterires without significant obstructive CAD,  normal LVF, ef 55-56%  . TONSILLECTOMY AND ADENOIDECTOMY  child  . TRANSTHORACIC ECHOCARDIOGRAM  01/18/2012   mild LVH,  ef 60%/  trivial TR  . TUBAL LIGATION  1985    There were no vitals filed for this visit.  Subjective Assessment - 01/09/19 1349    Subjective  Patient reported increased pain after doing too much at home    Pertinent History  Left Achilles repair, prior lumbar compression fracture, IBS, right total knee replacement.  How long can you sit comfortably?  6 minutes.    How long can you walk comfortably?  Short community distances.    Patient Stated Goals  Reduce pain.    Currently in Pain?  Yes    Pain Score  3     Pain Location  Back    Pain Orientation  Right;Mid;Lower    Pain Descriptors / Indicators  Discomfort    Pain Type  Chronic pain    Pain Onset  More than a month ago    Pain Frequency  Constant    Aggravating Factors   increased activity    Pain Relieving Factors  at rest                       [facility] Adult PT Treatment/Exercise  - 01/09/19 0001      Moist Heat Therapy   Number Minutes Moist Heat  15 Minutes    Moist Heat Location  Lumbar Spine      Electrical Stimulation   Electrical Stimulation Location  Right low back.    Electrical Stimulation Action  premod    Electrical Stimulation Parameters  80-150hz  x58mn    Electrical Stimulation Goals  Pain;Tone      Ultrasound   Ultrasound Location  right low back    Ultrasound Parameters  combo US/ES @1 .5w/cm2/50%/1102m x1249m    Manual Therapy   Manual Therapy  Soft tissue mobilization    Manual therapy comments  gentle MFR to right glut /piriformis    Soft tissue mobilization  STW to R lumbar paraspinals and QL to reduce pain and muscle tightness                  PT Long Term Goals - 01/05/19 1210      PT LONG TERM GOAL #1   Title  Ind with HEP.    Time  6    Period  Weeks    Status  New      PT LONG TERM GOAL #2   Title  Perform ADL's with pain not > 3/10.            Plan - 01/09/19 1421    Clinical Impression Statement  Patient tolerated treatment fair due to increased discomfort in right low back over weekend. Patient very tender in right flank and low back and buttock. Patient felt better after treatment. Goals ongoing due to pain deficts.     PT Frequency  2x / week    PT Duration  8 weeks    PT Treatment/Interventions  ADLs/Self Care Home Management;Cryotherapy;Electrical Stimulation;Ultrasound;Moist Heat;Therapeutic activities;Therapeutic exercise;Patient/family education;Manual techniques;Passive range of motion    PT Next Visit Plan  cont with POC for S and DTKC, hip bridges, Combo e'stim/U/S to right low back, STW/M HMP/e'stim.    Consulted and Agree with Plan of Care  Patient       Patient will benefit from skilled therapeutic intervention in order to improve the following deficits and impairments:  Decreased activity tolerance, Pain, Decreased range of motion, Increased muscle spasms  Visit Diagnosis: Chronic  right-sided low back pain, unspecified whether sciatica present  Abnormal posture     Problem List Patient Active Problem List   Diagnosis Date Noted  . Degeneration of lumbar intervertebral disc 08/06/2018  . Secondary esophageal varices without bleeding ([facility]/14/2019  . Encephalopathy, hepatic ([place]/24/2019  . History of paroxysmal supraventricular tachycardia   . Liver cirrhosis secondary to NASH ([facility] [place]5/03/2018  .  Irritable bowel syndrome-diarrhea predominant 06/15/2017  . Gastroesophageal reflux disease 06/15/2017  . Hyperlipidemia   . Kidney stone   . Essential hypertension 02/13/2010    Phillips Climes, PTA 01/09/2019, 3:03 PM  [facility] [address] [place], [place], [address] Phone: [phone]   Fax:  [phone]  Name: ORVETTA DANIELSKI MRN: [id number] Date of Birth: 07-31-1956

## 2019-01-12 ENCOUNTER — Encounter: Payer: 59 | Admitting: Physical Therapy

## 2019-01-16 ENCOUNTER — Encounter: Payer: Self-pay | Admitting: Physical Therapy

## 2019-01-16 ENCOUNTER — Ambulatory Visit: Payer: 59 | Admitting: Physical Therapy

## 2019-01-16 DIAGNOSIS — G8929 Other chronic pain: Secondary | ICD-10-CM

## 2019-01-16 DIAGNOSIS — R293 Abnormal posture: Secondary | ICD-10-CM | POA: Diagnosis not present

## 2019-01-16 DIAGNOSIS — M545 Low back pain: Principal | ICD-10-CM

## 2019-01-16 NOTE — Therapy (Signed)
[facility] [place], [place], [address] Phone: [phone]   Fax:  [phone]  Physical Therapy Treatment  Patient Details  Name: Dawn Moon MRN: [id number] Date of Birth: 1956/08/28 Referring Provider (PT): Suella Broad MD   Encounter Date: 01/16/2019  PT End of Session - 01/16/19 1340    Visit Number  7    Number of Visits  12    Date for PT Re-Evaluation  02/08/19    Authorization Type  FOTO AT LEAST EVERY 5TH VISIT, 10TH VISIT PROGRESS NOTE AND KX MODIFIER AFTER THE 15 VISIT.    PT Start Time  1300    PT Stop Time  1349    PT Time Calculation (min)  49 min    Activity Tolerance  Patient tolerated treatment well    Behavior During Therapy  [facility] for tasks assessed/performed       Past Medical History:  Diagnosis Date  . Ascites   . Encephalopathy, hepatic ([employer]) 05/13/2018  . GERD (gastroesophageal reflux disease)   . History of exercise stress test    03-07-2010  Stress echo--- no arrhythmias or conduction abnormalilites and negative for ischemia or chest pain  . History of kidney stones   . History of paroxysmal supraventricular tachycardia    episode 2011  consult w/ dr Caryl Comes --  put on atenolol--  per pt no longer an issue  . HTN (hypertension)    cardiologist --  dr Stanford Breed  . IBS (irritable bowel syndrome)    diarrhea  . Nonalcoholic steatohepatitis (NASH)   . Numbness and tingling of left lower extremity    post achilles tendon repair  . OA (osteoarthritis)    knees  . Pleural effusion on right    hepatic hydrothorax  . PONV (postoperative nausea and vomiting)   . Secondary esophageal varices without bleeding ([place]) 06/03/2018  . Vitamin D deficiency   . Wears glasses     Past Surgical History:  Procedure Laterality Date  . ACHILLES TENDON SURGERY Left 06/2016  . BREAST BIOPSY Left 02/2016   benign  . COLONOSCOPY  2005  . CT CTA CORONARY W/CA SCORE W/CM &/OR WO/CM  01/01/2014   non-obstructive calcified plaque in pLAD (0-25%), no significant incidental noncardiac findings noted  . ESOPHAGOGASTRODUODENOSCOPY  01/2014  . EXTRACORPOREAL SHOCK WAVE LITHOTRIPSY  2010  . KNEE ARTHROPLASTY Right 03/24/2018   Procedure: RIGHT TOTAL KNEE ARTHROPLASTY WITH COMPUTER NAVIGATION;  Surgeon: Rod Can, MD;  Location[facility];  Service: Orthopedics;  Laterality: Right;  Needs RNFA  . KNEE ARTHROSCOPY Right 12/04/2016   Procedure: ARTHROSCOPY KNEE WITH PARTIAL MEDIAL MENISCECTOMY;  Surgeon: Rod Can, MD;  Location: [place];  Service: Orthopedics;  Laterality: Right;  . LEFT HEART CATHETERIZATION WITH CORONARY ANGIOGRAM N/A 01/11/2012   Procedure: LEFT HEART CATHETERIZATION WITH CORONARY ANGIOGRAM;  Surgeon: Sherren Mocha, MD;  Location: [facility] CATH [facility];  Service: Cardiovascular;  Laterality: N/A;  widely patent coronary arterires without significant obstructive CAD,  normal LVF, ef 55-56%  . TONSILLECTOMY AND ADENOIDECTOMY  child  . TRANSTHORACIC ECHOCARDIOGRAM  01/18/2012   mild LVH,  ef 60%/  trivial TR  . TUBAL LIGATION  1985    There were no vitals filed for this visit.  Subjective Assessment - 01/16/19 1308    Subjective  Patient arrived with hardly any pain and felt much better after last treatment    Pertinent History  Left Achilles repair, prior lumbar compression fracture, IBS, right total knee replacement.  How long can you sit comfortably?  6 minutes.    How long can you walk comfortably?  Short community distances.    Patient Stated Goals  Reduce pain.    Currently in Pain?  Yes    Pain Score  --   1/2   Pain Location  Back    Pain Orientation  Right;Mid;Lower    Pain Descriptors / Indicators  Discomfort    Pain Type  Chronic pain    Pain Onset  More than a month ago    Pain Frequency  Intermittent    Aggravating Factors   twisting a certain way    Pain Relieving Factors  rest                       [facility] Adult PT  Treatment/Exercise - 01/16/19 0001      Moist Heat Therapy   Number Minutes Moist Heat  15 Minutes    Moist Heat Location  Lumbar Spine      Electrical Stimulation   Electrical Stimulation Location  Right low back.    Electrical Stimulation Action  premod    Electrical Stimulation Parameters  80-150hz  x47mn    Electrical Stimulation Goals  Pain;Tone      Ultrasound   Ultrasound Location  right mid and low back    Ultrasound Parameters  combo US/ES @ 1.5w/cm2/50%/161m x1467m   Ultrasound Goals  Pain      Manual Therapy   Manual Therapy  Soft tissue mobilization    Manual therapy comments  gentle MFR to right glut /piriformis    Soft tissue mobilization  STW to R lumbar paraspinals and QL to reduce pain and muscle tightness                  PT Long Term Goals - 01/16/19 1309      PT LONG TERM GOAL #1   Title  Ind with HEP.    Time  6    Period  Weeks    Status  On-going      PT LONG TERM GOAL #2   Title  Perform ADL's with pain not > 3/10.    Time  6    Period  Weeks    Status  On-going            Plan - 01/16/19 1341    Clinical Impression Statement  Patient tolerated treatment well today. Patient arrived with 1/2 /10 pain today and much improvement after last treatment. Patient has very palpable pain in right mid back and right glut area otherwise no pain complaints. Today focused on same treatment today as last to be consistant to the improvement of patient and next treatment if able will progress to gentle core activation with HEP provided. Goals progressing. Patient only doing draw in with holds at this time.     Rehab Potential  Good    PT Frequency  2x / week    PT Duration  8 weeks    PT Treatment/Interventions  ADLs/Self Care Home Management;Cryotherapy;Electrical Stimulation;Ultrasound;Moist Heat;Therapeutic activities;Therapeutic exercise;Patient/family education;Manual techniques;Passive range of motion    PT Next Visit Plan  cont with POC for  combo/STW/Modalities and gentle core progression when able with HEP provided    Consulted and Agree with Plan of Care  Patient       Patient will benefit from skilled therapeutic intervention in order to improve the following deficits and impairments:  Decreased activity tolerance, Pain, Decreased range  of motion, Increased muscle spasms  Visit Diagnosis: Chronic right-sided low back pain, unspecified whether sciatica present  Abnormal posture     Problem List Patient Active Problem List   Diagnosis Date Noted  . Degeneration of lumbar intervertebral disc 08/06/2018  . Secondary esophageal varices without bleeding ([place]) 06/03/2018  . Encephalopathy, hepatic ([place]) 05/13/2018  . History of paroxysmal supraventricular tachycardia   . Liver cirrhosis secondary to NASH ([place]) 04/23/2018  . Irritable bowel syndrome-diarrhea predominant 06/15/2017  . Gastroesophageal reflux disease 06/15/2017  . Hyperlipidemia   . Kidney stone   . Essential hypertension 02/13/2010    Phillips Climes, PTA 01/16/2019, 1:56 PM  [facility] [address] [place], [place], [address] Phone: [phone]   Fax:  [phone]  Name: Dawn Moon MRN: [id number] Date of Birth: [birth year]

## 2019-02-08 ENCOUNTER — Other Ambulatory Visit: Payer: Self-pay | Admitting: Family Medicine

## 2019-02-08 ENCOUNTER — Other Ambulatory Visit: Payer: Self-pay | Admitting: Internal Medicine

## 2019-02-08 NOTE — Telephone Encounter (Signed)
Please advise Sir, thank you. 

## 2019-02-08 NOTE — Telephone Encounter (Signed)
We can do it for 3 mos but she is being followed every 3 mos at [facility] so I would prefer they do the Rxs  Am available to help locally but if she is getting reg visits there does not need to see me and Rxs should go to Dr. Loletha Grayer at [facility]

## 2019-02-08 NOTE — Telephone Encounter (Signed)
Spoke to [employer] and she will get either [facility] or her PCP to refill in the future. I refilled both today for 3 months as approved.

## 2019-02-10 ENCOUNTER — Ambulatory Visit (INDEPENDENT_AMBULATORY_CARE_PROVIDER_SITE_OTHER): Payer: 59 | Admitting: Physician Assistant

## 2019-02-10 ENCOUNTER — Other Ambulatory Visit: Payer: Self-pay

## 2019-02-10 ENCOUNTER — Encounter: Payer: Self-pay | Admitting: Physician Assistant

## 2019-02-10 VITALS — BP 120/82 | HR 70 | Temp 97.9°F | Resp 16 | Ht 63.0 in | Wt 198.0 lb

## 2019-02-10 DIAGNOSIS — R300 Dysuria: Secondary | ICD-10-CM | POA: Diagnosis not present

## 2019-02-10 LAB — POCT URINALYSIS DIPSTICK
BILIRUBIN UA: NEGATIVE
Glucose, UA: NEGATIVE
Ketones, UA: NEGATIVE
LEUKOCYTES UA: NEGATIVE
Nitrite, UA: NEGATIVE
PH UA: 6 (ref 5.0–8.0)
Protein, UA: NEGATIVE
RBC UA: NEGATIVE
SPEC GRAV UA: 1.015 (ref 1.010–1.025)
UROBILINOGEN UA: 0.2 U/dL

## 2019-02-10 MED ORDER — CEPHALEXIN 500 MG PO CAPS: 500.0000 mg | ORAL_CAPSULE | Freq: Two times a day (BID) | ORAL | 0 refills | Status: AC

## 2019-02-10 NOTE — Patient Instructions (Signed)
Your symptoms are consistent with a bladder infection, also called acute cystitis. Please take your antibiotic (Keflex) as directed until all pills are gone.  Stay very well hydrated.  Consider a daily probiotic (Align, Culturelle, or Activia) to help prevent stomach upset caused by the antibiotic.  Taking a probiotic daily may also help prevent recurrent UTIs.  Also consider taking AZO (Phenazopyridine) tablets to help decrease pain with urination.  I will call you with your urine testing results.  We will change antibiotics if indicated.  Call or return to clinic if symptoms are not resolved by completion of antibiotic.   Urinary Tract Infection A urinary tract infection (UTI) can occur any place along the urinary tract. The tract includes the kidneys, ureters, bladder, and urethra. A type of germ called bacteria often causes a UTI. UTIs are often helped with antibiotic medicine.  HOME CARE   If given, take antibiotics as told by your doctor. Finish them even if you start to feel better.  Drink enough fluids to keep your pee (urine) clear or pale yellow.  Avoid tea, drinks with caffeine, and bubbly (carbonated) drinks.  Pee often. Avoid holding your pee in for a long time.  Pee before and after having sex (intercourse).  Wipe from front to back after you poop (bowel movement) if you are a woman. Use each tissue only once. GET HELP RIGHT AWAY IF:   You have back pain.  You have lower belly (abdominal) pain.  You have chills.  You feel sick to your stomach (nauseous).  You throw up (vomit).  Your burning or discomfort with peeing does not go away.  You have a fever.  Your symptoms are not better in 3 days. MAKE SURE YOU:   Understand these instructions.  Will watch your condition.  Will get help right away if you are not doing well or get worse. Document Released: 05/25/2008 Document Revised: 08/31/2012 Document Reviewed: 07/07/2012 [facility] Patient Information 2015  [place], [place]. This information is not intended to replace advice given to you by your health care provider. Make sure you discuss any questions you have with your health care provider.

## 2019-02-10 NOTE — Progress Notes (Signed)
Patient presents to clinic today c/o 1.5 days of dysuria, urinary urgency and frequency. Denies heamturia. Denies nausea or vomiting, fevers or chills, flank pain. Last UTI was 1 month ago -- first one she has had in ages. Was treated via Bactrim by online provider. Denies vaginal symptoms or concern for UTI.   Past Medical History:  Diagnosis Date  . Ascites   . Encephalopathy, hepatic ([place]) 05/13/2018  . GERD (gastroesophageal reflux disease)   . History of exercise stress test    03-07-2010  Stress echo--- no arrhythmias or conduction abnormalilites and negative for ischemia or chest pain  . History of kidney stones   . History of paroxysmal supraventricular tachycardia    episode 2011  consult w/ dr Caryl Comes --  put on atenolol--  per pt no longer an issue  . HTN (hypertension)    cardiologist --  dr Stanford Breed  . IBS (irritable bowel syndrome)    diarrhea  . Nonalcoholic steatohepatitis ([facility])   . Numbness and tingling of left lower extremity    post achilles tendon repair  . OA (osteoarthritis)    knees  . Pleural effusion on right    hepatic hydrothorax  . PONV (postoperative nausea and vomiting)   . Secondary esophageal varices without bleeding ([place]) 06/03/2018  . Vitamin D deficiency   . Wears glasses     Current Outpatient Medications on File Prior to Visit  Medication Sig Dispense Refill  . Butalbital-APAP-Caffeine 50-300-40 MG CAPS Take 1 tablet by mouth every 8 (eight) hours as needed. 84 capsule 2  . Eluxadoline (VIBERZI) 75 MG TABS Take 75 mg by mouth 2 (two) times daily. (Patient taking differently: Take 75 mg by mouth as needed. ) 180 tablet 3  . furosemide (LASIX) 40 MG tablet TAKE 1 TABLET DAILY 30 tablet 2  . hydrOXYzine (ATARAX/VISTARIL) 25 MG tablet TAKE 1 TABLET THREE TIMES DAILY AS NEEDED FOR ANXIETY 90 tablet 0  . lactulose (CHRONULAC) 10 GM/15ML solution Take 15 mLs (10 g total) by mouth 2 (two) times daily. 240 mL 6  . nadolol (CORGARD) 20 MG tablet TAKE  1/2 TO 1 TABLET ONCE DAILY AS DIRECTED 30 tablet 2  . pantoprazole (PROTONIX) 40 MG tablet TAKE 1 TABLET DAILY IN THE MORNING 30 tablet 3  . promethazine (PHENERGAN) 25 MG tablet Take 1 tablet (25 mg total) by mouth every 6 (six) hours as needed for nausea or vomiting. 30 tablet 1  . rifaximin (XIFAXAN) 550 MG TABS tablet Take 1 tablet (550 mg total) by mouth 2 (two) times daily. 60 tablet 5  . spironolactone (ALDACTONE) 100 MG tablet TAKE 1 TABLET DAILY 30 tablet 5  . tapentadol (NUCYNTA) 50 MG tablet Take 50 mg by mouth.    . traZODone (DESYREL) 50 MG tablet Take 0.5-1 tablets (25-50 mg total) by mouth at bedtime as needed for sleep. 90 tablet 1   No current facility-administered medications on file prior to visit.     No Known Allergies  Family History  Problem Relation Age of Onset  . Lymphoma Mother        non-hodgkins - died @ 42  . Coronary artery disease Mother   . Coronary artery disease Father        CABG in his 50s, died @ 45  . Diabetes Father   . Healthy Brother   . Hypertension Sister   . Arthritis Sister   . IgA nephropathy Daughter   . Celiac disease Daughter     Social  History   Socioeconomic History  . Marital status: Married    Spouse name: Not on file  . Number of children: Not on file  . Years of education: Not on file  . Highest education level: Not on file  Occupational History  . 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  . Financial resource strain: Not on file  . Food insecurity:    Worry: Not on file    Inability: Not on file  . Transportation needs:    Medical: Not on file    Non-medical: Not on file  Tobacco Use  . Smoking status: Never Smoker  . Smokeless tobacco: Never Used  Substance and Sexual Activity  . Alcohol use: Not Currently  . Drug use: No  . Sexual activity: Not on file  Lifestyle  . Physical activity:    Days per week: Not on file    Minutes per session: Not on file  . Stress:  Not on file  Relationships  . Social connections:    Talks on phone: Not on file    Gets together: Not on file    Attends religious service: Not on file    Active member of club or organization: Not on file    Attends meetings of clubs or organizations: Not on file    Relationship status: Not on file  Other Topics Concern  . 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  Has to walk up stairs to work and can do w/o Ss.  Lives @ home in [place] with family.   Review of Systems - See HPI.  All other ROS are negative.  BP 120/82   Pulse 70   Temp 97.9 F (36.6 C) (Oral)   Resp 16   Ht 5' 3"  (1.6 m)   Wt 198 lb (89.8 kg)   SpO2 98%   BMI 35.07 kg/m   Physical Exam Vitals signs reviewed.  Constitutional:      Appearance: Normal appearance.  HENT:     Head: Normocephalic and atraumatic.     Nose: Nose normal.  Neck:     Musculoskeletal: Neck supple.  Cardiovascular:     Rate and Rhythm: Normal rate and regular rhythm.     Heart sounds: Normal heart sounds.  Pulmonary:     Effort: Pulmonary effort is normal.     Breath sounds: Normal breath sounds.  Abdominal:     Palpations: Abdomen is soft.     Tenderness: There is no abdominal tenderness. There is no right CVA tenderness or left CVA tenderness.  Neurological:     General: No focal deficit present.     Mental Status: She is alert and oriented to person, place, and time.     Recent Results (from the past 2160 hour(s))  CBC w/Diff     Status: Abnormal   Collection Time: 12/09/18  8:15 AM  Result Value Ref Range   WBC 5.7 4.0 - 10.5 K/uL   RBC 4.35 3.87 - 5.11 Mil/uL   Hemoglobin 13.6 12.0 - 15.0 g/dL   HCT 39.7 36.0 - 46.0 %   MCV 91.2 78.0 - 100.0 fl   MCHC 34.1 30.0 - 36.0 g/dL   RDW 14.8 11.5 - 15.5 %   Platelets  141.0 (L) 150.0 - 400.0 K/uL    Neutrophils Relative % 55.8 43.0 - 77.0 %   Lymphocytes Relative 29.4 12.0 - 46.0 %   Monocytes Relative 10.2 3.0 - 12.0 %   Eosinophils Relative 3.3 0.0 - 5.0 %   Basophils Relative 1.3 0.0 - 3.0 %   Neutro Abs 3.2 1.4 - 7.7 K/uL   Lymphs Abs 1.7 0.7 - 4.0 K/uL   Monocytes Absolute 0.6 0.1 - 1.0 K/uL   Eosinophils Absolute 0.2 0.0 - 0.7 K/uL   Basophils Absolute 0.1 0.0 - 0.1 K/uL  Comp Met (CMET)     Status: Abnormal   Collection Time: 12/09/18  8:15 AM  Result Value Ref Range   Sodium 138 135 - 145 mEq/L   Potassium 4.0 3.5 - 5.1 mEq/L   Chloride 103 96 - 112 mEq/L   CO2 27 19 - 32 mEq/L   Glucose, Bld 106 (H) 70 - 99 mg/dL   BUN 17 6 - 23 mg/dL   Creatinine, Ser 1.30 (H) 0.40 - 1.20 mg/dL   Total Bilirubin 1.6 (H) 0.2 - 1.2 mg/dL   Alkaline Phosphatase 93 39 - 117 U/L   AST 45 (H) 0 - 37 U/L   ALT 25 0 - 35 U/L   Total Protein 7.2 6.0 - 8.3 g/dL   Albumin 3.4 (L) 3.5 - 5.2 g/dL   Calcium 9.7 8.4 - 10.5 mg/dL   GFR 44.07 (L) >60.00 mL/min  Protime-INR ( [facility] ONLY)     Status: None   Collection Time: 12/09/18  8:17 AM  Result Value Ref Range   INR 1.1     Comment: Reference Range                     0.9-1.1 Moderate-intensity Warfarin Therapy 2.0-3.0 Higher-intensity Warfarin Therapy   3.0-4.0  .    Prothrombin Time 11.3 9.0 - 11.5 sec    Comment: . For more information on this test, go to: http://education.[facility].com/faq/FAQ104 .   POCT Urinalysis Dipstick     Status: Normal   Collection Time: 02/10/19  2:43 PM  Result Value Ref Range   Color, UA yellow    Clarity, UA clear    Glucose, UA Negative Negative   Bilirubin, UA negative    Ketones, UA negative    Spec Grav, UA 1.015 1.010 - 1.025   Blood, UA negative    pH, UA 6.0 5.0 - 8.0   Protein, UA Negative Negative   Urobilinogen, UA 0.2 0.2 or 1.0 E.U./dL   Nitrite, UA negative    Leukocytes, UA Negative Negative   Appearance     Odor      Assessment/Plan: 1. Dysuria Urine dip  negative. Will send for culture giving classic symptoms and + history. Start Keflex empirically. Supportive measures and OTC medications reviewed. Will alter treatment based on culture results and response to empiric Tx.   - POCT Urinalysis Dipstick - Urine Culture - cephALEXin (KEFLEX) 500 MG capsule; Take 1 capsule (500 mg total) by mouth 2 (two) times daily for 7 days.  Dispense: 14 capsule; Refill: 0   Leeanne Rio, PA-C

## 2019-02-12 LAB — URINE CULTURE
MICRO NUMBER: 227358
SPECIMEN QUALITY:: ADEQUATE

## 2019-02-13 ENCOUNTER — Encounter: Payer: Self-pay | Admitting: Family Medicine

## 2019-02-27 ENCOUNTER — Emergency Department (HOSPITAL_BASED_OUTPATIENT_CLINIC_OR_DEPARTMENT_OTHER): Payer: 59

## 2019-02-27 ENCOUNTER — Encounter (HOSPITAL_BASED_OUTPATIENT_CLINIC_OR_DEPARTMENT_OTHER): Payer: Self-pay | Admitting: *Deleted

## 2019-02-27 ENCOUNTER — Observation Stay (HOSPITAL_BASED_OUTPATIENT_CLINIC_OR_DEPARTMENT_OTHER)
Admission: EM | Admit: 2019-02-27 | Discharge: 2019-03-01 | Disposition: A | Discharge status: Home / Self Care | Payer: 59 | Attending: Family Medicine | Admitting: Family Medicine

## 2019-02-27 ENCOUNTER — Other Ambulatory Visit: Payer: Self-pay

## 2019-02-27 ENCOUNTER — Ambulatory Visit (INDEPENDENT_AMBULATORY_CARE_PROVIDER_SITE_OTHER): Payer: 59 | Admitting: Family Medicine

## 2019-02-27 ENCOUNTER — Encounter: Payer: Self-pay | Admitting: Family Medicine

## 2019-02-27 VITALS — BP 121/81 | HR 80 | Temp 98.1°F | Resp 16 | Ht 63.0 in | Wt 196.0 lb

## 2019-02-27 DIAGNOSIS — D696 Thrombocytopenia, unspecified: Secondary | ICD-10-CM | POA: Insufficient documentation

## 2019-02-27 DIAGNOSIS — N390 Urinary tract infection, site not specified: Secondary | ICD-10-CM | POA: Diagnosis not present

## 2019-02-27 DIAGNOSIS — Z79899 Other long term (current) drug therapy: Secondary | ICD-10-CM | POA: Insufficient documentation

## 2019-02-27 DIAGNOSIS — R300 Dysuria: Secondary | ICD-10-CM

## 2019-02-27 DIAGNOSIS — D649 Anemia, unspecified: Secondary | ICD-10-CM

## 2019-02-27 DIAGNOSIS — D509 Iron deficiency anemia, unspecified: Secondary | ICD-10-CM | POA: Insufficient documentation

## 2019-02-27 DIAGNOSIS — R42 Dizziness and giddiness: Secondary | ICD-10-CM

## 2019-02-27 DIAGNOSIS — N3021 Other chronic cystitis with hematuria: Principal | ICD-10-CM | POA: Insufficient documentation

## 2019-02-27 DIAGNOSIS — N3001 Acute cystitis with hematuria: Secondary | ICD-10-CM

## 2019-02-27 DIAGNOSIS — E860 Dehydration: Secondary | ICD-10-CM | POA: Diagnosis not present

## 2019-02-27 DIAGNOSIS — I10 Essential (primary) hypertension: Secondary | ICD-10-CM | POA: Diagnosis not present

## 2019-02-27 DIAGNOSIS — R1110 Vomiting, unspecified: Secondary | ICD-10-CM | POA: Diagnosis present

## 2019-02-27 DIAGNOSIS — Z96651 Presence of right artificial knee joint: Secondary | ICD-10-CM | POA: Insufficient documentation

## 2019-02-27 DIAGNOSIS — R6510 Systemic inflammatory response syndrome (SIRS) of non-infectious origin without acute organ dysfunction: Secondary | ICD-10-CM | POA: Insufficient documentation

## 2019-02-27 DIAGNOSIS — R55 Syncope and collapse: Secondary | ICD-10-CM

## 2019-02-27 LAB — URINALYSIS, ROUTINE W REFLEX MICROSCOPIC
Glucose, UA: 100 mg/dL — AB
Ketones, ur: 15 mg/dL — AB
Nitrite: POSITIVE — AB
Protein, ur: 100 mg/dL — AB
Specific Gravity, Urine: >1.03 — ABNORMAL HIGH (ref 1.005–1.030)
pH: 5 (ref 5.0–8.0)

## 2019-02-27 LAB — POCT URINALYSIS DIPSTICK
GLUCOSE UA: NEGATIVE
Nitrite, UA: POSITIVE
PH UA: 5 (ref 5.0–8.0)
Protein, UA: NEGATIVE
RBC UA: NEGATIVE
SPEC GRAV UA: 1.025 (ref 1.010–1.025)
Urobilinogen, UA: 2 E.U./dL — AB

## 2019-02-27 LAB — CBC WITH DIFFERENTIAL/PLATELET
ABS IMMATURE GRANULOCYTES: 0.05 10*3/uL (ref 0.00–0.07)
BASOS ABS: 0 10*3/uL (ref 0.0–0.1)
Basophils Relative: 1 %
Eosinophils Absolute: 0 10*3/uL (ref 0.0–0.5)
Eosinophils Relative: 0 %
HCT: 36.6 % (ref 36.0–46.0)
Hemoglobin: 11.8 g/dL — ABNORMAL LOW (ref 12.0–15.0)
Immature Granulocytes: 1 %
LYMPHS ABS: 0.2 10*3/uL — AB (ref 0.7–4.0)
Lymphocytes Relative: 4 %
MCH: 30.1 pg (ref 26.0–34.0)
MCHC: 32.2 g/dL (ref 30.0–36.0)
MCV: 93.4 fL (ref 80.0–100.0)
Monocytes Absolute: 0.1 10*3/uL (ref 0.1–1.0)
Monocytes Relative: 1 %
Neutro Abs: 5.5 10*3/uL (ref 1.7–7.7)
Neutrophils Relative %: 93 %
Platelets: 94 10*3/uL — ABNORMAL LOW (ref 150–400)
RBC: 3.92 MIL/uL (ref 3.87–5.11)
RDW: 14.5 % (ref 11.5–15.5)
Smear Review: NORMAL
WBC Morphology: INCREASED
WBC: 6.1 10*3/uL (ref 4.0–10.5)
nRBC: 0 % (ref 0.0–0.2)

## 2019-02-27 LAB — COMPREHENSIVE METABOLIC PANEL
ALT: 20 U/L (ref 0–44)
AST: 43 U/L — ABNORMAL HIGH (ref 15–41)
Albumin: 2.9 g/dL — ABNORMAL LOW (ref 3.5–5.0)
Alkaline Phosphatase: 86 U/L (ref 38–126)
Anion gap: 7 (ref 5–15)
BUN: 15 mg/dL (ref 8–23)
CO2: 20 mmol/L — ABNORMAL LOW (ref 22–32)
Calcium: 8.1 mg/dL — ABNORMAL LOW (ref 8.9–10.3)
Chloride: 108 mmol/L (ref 98–111)
Creatinine, Ser: 1.58 mg/dL — ABNORMAL HIGH (ref 0.44–1.00)
GFR calc Af Amer: 40 mL/min — ABNORMAL LOW (ref >60)
GFR calc non Af Amer: 35 mL/min — ABNORMAL LOW (ref >60)
Glucose, Bld: 87 mg/dL (ref 70–99)
Potassium: 3.5 mmol/L (ref 3.5–5.1)
Sodium: 135 mmol/L (ref 135–145)
TOTAL PROTEIN: 6.3 g/dL — AB (ref 6.5–8.1)
Total Bilirubin: 1.7 mg/dL — ABNORMAL HIGH (ref 0.3–1.2)

## 2019-02-27 LAB — LACTIC ACID, PLASMA
Lactic Acid, Venous: 2.2 mmol/L (ref 0.5–1.9)
Lactic Acid, Venous: 2.1 mmol/L (ref 0.5–1.9)

## 2019-02-27 LAB — URINALYSIS, MICROSCOPIC (REFLEX): WBC, UA: >50 WBC/hpf (ref 0–5)

## 2019-02-27 LAB — PROTIME-INR
INR: 1.4 — ABNORMAL HIGH (ref 0.8–1.2)
Prothrombin Time: 17.3 seconds — ABNORMAL HIGH (ref 11.4–15.2)

## 2019-02-27 MED ORDER — SULFAMETHOXAZOLE-TRIMETHOPRIM 800-160 MG PO TABS: 1.0000 | ORAL_TABLET | Freq: Two times a day (BID) | ORAL | 0 refills | Status: DC

## 2019-02-27 MED ORDER — SODIUM CHLORIDE 0.9 % IV SOLN
1.0000 g | Freq: Once | INTRAVENOUS | Status: AC
  Administered 2019-02-27: 1 g via INTRAVENOUS
  Filled 2019-02-27: qty 10

## 2019-02-27 MED ORDER — SODIUM CHLORIDE 0.9 % IV SOLN
INTRAVENOUS | Status: DC | PRN
  Administered 2019-02-27: 500 mL via INTRAVENOUS

## 2019-02-27 MED ORDER — ONDANSETRON HCL 4 MG/2ML IJ SOLN
4.0000 mg | Freq: Once | INTRAMUSCULAR | Status: AC
  Administered 2019-02-27: 4 mg via INTRAVENOUS
  Filled 2019-02-27: qty 2

## 2019-02-27 MED ORDER — SODIUM CHLORIDE 0.9 % IV SOLN
Freq: Once | INTRAVENOUS | Status: AC
  Administered 2019-02-27: 21:00:00 via INTRAVENOUS

## 2019-02-27 MED ORDER — MORPHINE SULFATE (PF) 4 MG/ML IV SOLN
4.0000 mg | Freq: Once | INTRAVENOUS | Status: AC
  Administered 2019-02-27: 4 mg via INTRAVENOUS
  Filled 2019-02-27: qty 1

## 2019-02-27 MED ORDER — SODIUM CHLORIDE 0.9 % IV BOLUS
1000.0000 mL | Freq: Once | INTRAVENOUS | Status: AC
  Administered 2019-02-27: 1000 mL via INTRAVENOUS

## 2019-02-27 NOTE — Plan of Care (Signed)
63 year old female history of GERD kidney stones paroxysmal supraventricular tachycardia hypertension, NASH  Diarrhea and had 3 UTIs this month. She just finished a course of Keflex and was today started Bactrim by her  PCP Today had chills and vomiting Cr 1.58 up from 1.3 Very ortho static  Lactic 2.2  WBC 6.1   CT cystitis   Dawn Moon 11:39 PM

## 2019-02-27 NOTE — ED Triage Notes (Signed)
Vomiting and chills for 30 minutes.  She was seen by her MD today for UTI and started on her 3rd antibiotic. She is ambulatory.

## 2019-02-27 NOTE — Progress Notes (Signed)
   Subjective:    Patient ID: Dawn Moon, female    DOB: Dec 15, 1956, 63 y.o.   MRN: [id number]  HPI UTI- pt was seen on 2/21 and was tx'd w/ Keflex for Klebsiella UTI.  2/4 did '[facility]' and was given Septra for presumed UTI.  Sxs again started Saturday.  Now having increased urinary incontinence.  + dysuria.  + suprapubic pressure.  'I just feel tired'.  No fevers.  Pt has ongoing diarrhea.   Review of Systems For ROS see HPI     Objective:   Physical Exam Vitals signs reviewed.  Constitutional:      General: She is not in acute distress.    Appearance: She is well-developed. She is obese.  Abdominal:     General: There is no distension.     Palpations: Abdomen is soft.     Tenderness: There is abdominal tenderness (+ suprapubic, but no CVA tenderness ).  Neurological:     General: No focal deficit present.     Mental Status: She is alert and oriented to person, place, and time.           Assessment & Plan:  Recurrent UTI- new.  This is pt's 3rd symptomatic UTI in a month.  I suspect her ongoing diarrhea is the culprit and we discussed need for careful cleaning.  Start Bactrim as she just finished a course of Keflex.  Await culture results and change meds as needed.

## 2019-02-27 NOTE — Patient Instructions (Signed)
Follow up as needed or as scheduled START the Trimethoprim Sulfa twice daily Drink LOTS of fluids We'll notify you of your culture results and make any changes if needed Call with any questions or concerns Hang in there!!

## 2019-02-27 NOTE — ED Notes (Signed)
Contacted [facility]) to ask Admitting Physician to put in bed request.

## 2019-02-27 NOTE — ED Notes (Signed)
2nd attempt to get pt for CT. RN is giving nausea meds. RN stated she would call when pt is ready.

## 2019-02-27 NOTE — ED Notes (Signed)
Date and time results received: 02/27/19 2040 (use smartphrase ".now" to insert current time)  Test: lactic acid Critical Value: 2.2  Name of Provider Notified: Dr. Johnney Killian  Orders Received? Or Actions Taken?: no new orders at this time.

## 2019-02-27 NOTE — ED Notes (Signed)
Date and time results received: 02/27/19 2355 (use smartphrase ".now" to insert current time)  Test: lactic acid Critical Value: 2.1  Name of Provider Notified: Dr. Johnney Killian  Orders Received? Or Actions Taken?: no new orders

## 2019-02-27 NOTE — ED Provider Notes (Signed)
[facility] EMERGENCY DEPARTMENT Provider Note   CSN: [id number] Arrival date & time: 02/27/19  1754    History   Chief Complaint Chief Complaint  Patient presents with  . Emesis    HPI Dawn Moon is a 63 y.o. female.     HPI Patient reports she has been having problems with urinary tract infection for approximately a month.  She has had 3 courses of antibiotics.  She reports that today she started vomiting and getting severe chills.  Patient reports she feels achy all over.  She denies any localizing flank pain.  She reports she does have a history of kidney stones.  She reports she also has a history of [place].  She reports she has been treated with Bactrim and Keflex. Past Medical History:  Diagnosis Date  . Ascites   . Encephalopathy, hepatic ([facility]) 05/13/2018  . GERD (gastroesophageal reflux disease)   . History of exercise stress test    03-07-2010  Stress echo--- no arrhythmias or conduction abnormalilites and negative for ischemia or chest pain  . History of kidney stones   . History of paroxysmal supraventricular tachycardia    episode 2011  consult w/ dr Caryl Comes --  put on atenolol--  per pt no longer an issue  . HTN (hypertension)    cardiologist --  dr Stanford Breed  . IBS (irritable bowel syndrome)    diarrhea  . Nonalcoholic steatohepatitis (NASH)   . Numbness and tingling of left lower extremity    post achilles tendon repair  . OA (osteoarthritis)    knees  . Pleural effusion on right    hepatic hydrothorax  . PONV (postoperative nausea and vomiting)   . Secondary esophageal varices without bleeding ([place]) 06/03/2018  . Vitamin D deficiency   . Wears glasses     Patient Active Problem List   Diagnosis Date Noted  . SIRS (systemic inflammatory response syndrome) ([facility]) 02/27/2019  . Degeneration of lumbar intervertebral disc 08/06/2018  . Secondary esophageal varices without bleeding ([facility]) 06/03/2018  . Encephalopathy, hepatic ([facility]) 05/13/2018    . History of paroxysmal supraventricular tachycardia   . Liver cirrhosis secondary to NASH ([facility]) 04/23/2018  . Irritable bowel syndrome-diarrhea predominant 06/15/2017  . Gastroesophageal reflux disease 06/15/2017  . Hyperlipidemia   . Kidney stone   . Essential hypertension 02/13/2010    Past Surgical History:  Procedure Laterality Date  . ACHILLES TENDON SURGERY Left 06/2016  . BREAST BIOPSY Left 02/2016   benign  . COLONOSCOPY  2005  . CT CTA CORONARY W/CA SCORE W/CM &/OR WO/CM  01/01/2014   non-obstructive calcified plaque in pLAD (0-25%), no significant incidental noncardiac findings noted  . ESOPHAGOGASTRODUODENOSCOPY  01/2014  . EXTRACORPOREAL SHOCK WAVE LITHOTRIPSY  2010  . KNEE ARTHROPLASTY Right 03/24/2018   Procedure: RIGHT TOTAL KNEE ARTHROPLASTY WITH COMPUTER NAVIGATION;  Surgeon: Rod Can, MD;  Location: [facility];  Service: Orthopedics;  Laterality: Right;  Needs RNFA  . KNEE ARTHROSCOPY Right 12/04/2016   Procedure: ARTHROSCOPY KNEE WITH PARTIAL MEDIAL MENISCECTOMY;  Surgeon: Rod Can, MD;  Location: [facility];  Service: Orthopedics;  Laterality: Right;  . LEFT HEART CATHETERIZATION WITH CORONARY ANGIOGRAM N/A 01/11/2012   Procedure: LEFT HEART CATHETERIZATION WITH CORONARY ANGIOGRAM;  Surgeon: Sherren Mocha, MD;  Location: [facility] CATH LAB;  Service: Cardiovascular;  Laterality: N/A;  widely patent coronary arterires without significant obstructive CAD,  normal LVF, ef 55-56%  . TONSILLECTOMY AND ADENOIDECTOMY  child  . TRANSTHORACIC ECHOCARDIOGRAM  01/18/2012  mild LVH,  ef 60%/  trivial TR  . TUBAL LIGATION  1985     OB History   No obstetric history on file.      Home Medications    Prior to Admission medications   Medication Sig Start Date End Date Taking? Authorizing Provider  Butalbital-APAP-Caffeine 50-300-40 MG CAPS Take 1 tablet by mouth every 8 (eight) hours as needed. 01/10/18   Hawks, Christy A, FNP  Eluxadoline (VIBERZI) 75  MG TABS Take 75 mg by mouth 2 (two) times daily. Patient taking differently: Take 75 mg by mouth as needed.  06/27/18   Gatha Mayer, MD  furosemide (LASIX) 40 MG tablet TAKE 1 TABLET DAILY 02/08/19   Gatha Mayer, MD  hydrOXYzine (ATARAX/VISTARIL) 25 MG tablet TAKE 1 TABLET THREE TIMES DAILY AS NEEDED FOR ANXIETY 02/08/19   Leamon Arnt, MD  lactulose (CHRONULAC) 10 GM/15ML solution Take 15 mLs (10 g total) by mouth 2 (two) times daily. 06/22/18   Gatha Mayer, MD  nadolol (CORGARD) 20 MG tablet TAKE 1/2 TO 1 TABLET ONCE DAILY AS DIRECTED 02/08/19   Gatha Mayer, MD  pantoprazole (PROTONIX) 40 MG tablet TAKE 1 TABLET DAILY IN THE MORNING 10/19/18   Gatha Mayer, MD  promethazine (PHENERGAN) 25 MG tablet Take 1 tablet (25 mg total) by mouth every 6 (six) hours as needed for nausea or vomiting. 09/16/18   Leamon Arnt, MD  rifaximin (XIFAXAN) 550 MG TABS tablet Take 1 tablet (550 mg total) by mouth 2 (two) times daily. 05/25/18   Zehr, Janett Billow D, PA-C  spironolactone (ALDACTONE) 100 MG tablet TAKE 1 TABLET DAILY 10/20/18   Gatha Mayer, MD  sulfamethoxazole-trimethoprim (BACTRIM DS,SEPTRA DS) 800-160 MG tablet Take 1 tablet by mouth 2 (two) times daily. 02/27/19   Midge Minium, MD  tapentadol (NUCYNTA) 50 MG tablet Take 50 mg by mouth.    [provider]  traZODone (DESYREL) 50 MG tablet Take 0.5-1 tablets (25-50 mg total) by mouth at bedtime as needed for sleep. 10/20/18   Leamon Arnt, MD    Family History Family History  Problem Relation Age of Onset  . Lymphoma Mother        non-hodgkins - died @ 51  . Coronary artery disease Mother   . Coronary artery disease Father        CABG in his 49s, died @ 78  . Diabetes Father   . Healthy Brother   . Hypertension Sister   . Arthritis Sister   . IgA nephropathy Daughter   . Celiac disease Daughter     Social History Social History   Tobacco Use  . Smoking status: Never Smoker  . Smokeless tobacco: Never  Used  Substance Use Topics  . Alcohol use: Not Currently  . Drug use: No     Allergies   Patient has no known allergies.   Review of Systems Review of Systems 10 Systems reviewed and are negative for acute change except as noted in the HPI.   Physical Exam Updated Vital Signs BP 114/65 (BP Location: Right Arm)   Pulse 100   Temp 98.5 F (36.9 C) (Oral)   Resp 18   Ht 5' 3"  (1.6 m)   Wt 88.5 kg   SpO2 95%   BMI 34.54 kg/m   Physical Exam Constitutional:      Comments: Alert and appropriate.  She is having chills.  No respiratory distress.  HENT:     Head:  Normocephalic and atraumatic.     Mouth/Throat:     Mouth: Mucous membranes are moist.     Pharynx: Oropharynx is clear.  Eyes:     Extraocular Movements: Extraocular movements intact.  Cardiovascular:     Rate and Rhythm: Normal rate and regular rhythm.     Pulses: Normal pulses.     Heart sounds: Normal heart sounds.  Pulmonary:     Effort: Pulmonary effort is normal.     Breath sounds: Normal breath sounds.  Abdominal:     Comments: Soft.  Moderate suprapubic tenderness to palpation.  No significant CVA tenderness to percussion.  Musculoskeletal: Normal range of motion.        General: No swelling.     Right lower leg: No edema.     Left lower leg: No edema.  Skin:    General: Skin is warm and dry.     Coloration: Skin is pale.  Neurological:     General: No focal deficit present.     Mental Status: She is oriented to person, place, and time.     Coordination: Coordination normal.  Psychiatric:        Mood and Affect: Mood normal.      ED Treatments / Results  Labs (all labs ordered are listed, but only abnormal results are displayed) Labs Reviewed  URINALYSIS, ROUTINE W REFLEX MICROSCOPIC - Abnormal; Notable for the following components:      Result Value   Color, Urine ORANGE (*)    APPearance CLOUDY (*)    Specific Gravity, Urine >1.030 (*)    Glucose, UA 100 (*)    Hgb urine dipstick  TRACE (*)    Bilirubin Urine MODERATE (*)    Ketones, ur 15 (*)    Protein, ur 100 (*)    Nitrite POSITIVE (*)    Leukocytes,Ua MODERATE (*)    All other components within normal limits  URINALYSIS, MICROSCOPIC (REFLEX) - Abnormal; Notable for the following components:   Bacteria, UA MANY (*)    All other components within normal limits  LACTIC ACID, PLASMA - Abnormal; Notable for the following components:   Lactic Acid, Venous 2.2 (*)    All other components within normal limits  LACTIC ACID, PLASMA - Abnormal; Notable for the following components:   Lactic Acid, Venous 2.1 (*)    All other components within normal limits  CBC WITH DIFFERENTIAL/PLATELET - Abnormal; Notable for the following components:   Hemoglobin 11.8 (*)    Platelets 94 (*)    Lymphs Abs 0.2 (*)    All other components within normal limits  COMPREHENSIVE METABOLIC PANEL - Abnormal; Notable for the following components:   CO2 20 (*)    Creatinine, Ser 1.58 (*)    Calcium 8.1 (*)    Total Protein 6.3 (*)    Albumin 2.9 (*)    AST 43 (*)    Total Bilirubin 1.7 (*)    GFR calc non Af Amer 35 (*)    GFR calc Af Amer 40 (*)    All other components within normal limits  PROTIME-INR - Abnormal; Notable for the following components:   Prothrombin Time 17.3 (*)    INR 1.4 (*)    All other components within normal limits  CULTURE, BLOOD (ROUTINE X 2)  CULTURE, BLOOD (ROUTINE X 2)    EKG None  Radiology Ct Renal Stone Study  Result Date: 02/27/2019 CLINICAL DATA:  63 y/o  F; 30 minutes of vomiting and chills. EXAM:  CT ABDOMEN AND PELVIS WITHOUT CONTRAST TECHNIQUE: Multidetector CT imaging of the abdomen and pelvis was performed following the standard protocol without IV contrast. COMPARISON:  04/23/2018 CT abdomen and pelvis. FINDINGS: Lower chest: Negative. Hepatobiliary: Lobulated liver contour with enlarged left lobe compatible with cirrhosis. No gallbladder wall thickening or cholelithiasis. No biliary ductal  dilatation. No focal liver abnormality is evident on this noncontrast examination. Recanalized umbilical vein as well as omental, gastrohepatic, and lower esophageal portal caval collaterals. Pancreas: Unremarkable. No pancreatic ductal dilatation or surrounding inflammatory changes. Spleen: Spleen measures 14.1 x 6.7 x 10.1 cm (volume = 500 cm^3), mildly increased in size. Adrenals/Urinary Tract: Adrenal glands are unremarkable. Punctate nonobstructing kidney stones bilaterally. No hydronephrosis or ureter stone. Diffuse bladder wall thickening. Stomach/Bowel: Stomach is within normal limits. Appendix appears normal. No evidence of bowel wall thickening, distention, or inflammatory changes. Vascular/Lymphatic: Aortic atherosclerosis. No enlarged abdominal or pelvic lymph nodes. Reproductive: 10 mm calcification in the right lateral myometrium, likely small underlying myoma. Normal adnexa. Other: No abdominal wall hernia or abnormality. No abdominopelvic ascites. Mild mesenteric edema. Musculoskeletal: No fracture is seen. Multilevel discogenic degenerative changes of the spine and lower lumbar facet arthrosis. IMPRESSION: 1. Punctate nonobstructing kidney stones bilaterally. No hydronephrosis or ureter stone. 2. Diffuse bladder wall thickening compatible with cystitis. 3. Cirrhosis and stigmata of portal hypertension. Mildly increased spleen size, 500 cc. Electronically Signed   By: Kristine Garbe M.D.   On: 02/27/2019 21:28    Procedures Procedures (including critical care time)  Medications Ordered in ED Medications  0.9 %  sodium chloride infusion (500 mLs Intravenous New Bag/Given 02/27/19 2027)  sodium chloride 0.9 % bolus 1,000 mL ( Intravenous Stopped 02/27/19 2112)  cefTRIAXone (ROCEPHIN) 1 g in sodium chloride 0.9 % 100 mL IVPB (0 g Intravenous Stopped 02/27/19 2059)  morphine 4 MG/ML injection 4 mg (4 mg Intravenous Given 02/27/19 2110)  ondansetron (ZOFRAN) injection 4 mg (4 mg Intravenous  Given 02/27/19 2022)  0.9 %  sodium chloride infusion ( Intravenous New Bag/Given 02/27/19 2123)  ondansetron (ZOFRAN) injection 4 mg (4 mg Intravenous Given 02/27/19 2349)     Initial Impression / Assessment and Plan / ED Course  I have reviewed the triage vital signs and the nursing notes.  Pertinent labs & imaging results that were available during my care of the patient were reviewed by me and considered in my medical decision making (see chart for details).  Clinical Course as of Mar 10 0001  Mon Feb 27, 2019  2248 Patient had felt improved after fluids and pain medication while lying down.  However once she was tested for orthostatics she became extremely dizzy and nauseated and had difficulty remaining in standing position to complete her orthostatic vital signs.  Pressure had dropped.  Patient still remains tachycardic to the low 100s.  Will plan for admission.   [MP]    Clinical Course User Index [MP] Charlesetta Shanks, MD      Patient presents with ill appearance.  She has had recurrent UTI.  Urine is grossly positive.  She reports she just suddenly today started getting severe chills and vomiting.  Patient was rehydrated and given antibiotics but still remained symptomatic.  She felt very lightheaded with standing.  Nausea came back again.  At this time plan for admission for UTI with dehydration, orthostatic positive.  Final Clinical Impressions(s) / ED Diagnoses   Final diagnoses:  Acute cystitis with hematuria  SIRS (systemic inflammatory response syndrome) (HCC)  Near syncope  ED Discharge Orders    None       Charlesetta Shanks, MD 02/28/19 0002

## 2019-02-28 ENCOUNTER — Encounter: Payer: Self-pay | Admitting: Family Medicine

## 2019-02-28 ENCOUNTER — Other Ambulatory Visit: Payer: Self-pay | Admitting: *Deleted

## 2019-02-28 DIAGNOSIS — E860 Dehydration: Secondary | ICD-10-CM

## 2019-02-28 DIAGNOSIS — R42 Dizziness and giddiness: Secondary | ICD-10-CM

## 2019-02-28 DIAGNOSIS — Z96651 Presence of right artificial knee joint: Secondary | ICD-10-CM | POA: Diagnosis not present

## 2019-02-28 DIAGNOSIS — K2100 Gastro-esophageal reflux disease with esophagitis, without bleeding: Secondary | ICD-10-CM

## 2019-02-28 DIAGNOSIS — R1110 Vomiting, unspecified: Secondary | ICD-10-CM | POA: Diagnosis present

## 2019-02-28 DIAGNOSIS — N3090 Cystitis, unspecified without hematuria: Secondary | ICD-10-CM | POA: Diagnosis not present

## 2019-02-28 DIAGNOSIS — Z79899 Other long term (current) drug therapy: Secondary | ICD-10-CM | POA: Diagnosis not present

## 2019-02-28 DIAGNOSIS — N3021 Other chronic cystitis with hematuria: Secondary | ICD-10-CM | POA: Diagnosis not present

## 2019-02-28 DIAGNOSIS — D509 Iron deficiency anemia, unspecified: Secondary | ICD-10-CM | POA: Diagnosis not present

## 2019-02-28 DIAGNOSIS — D696 Thrombocytopenia, unspecified: Secondary | ICD-10-CM | POA: Diagnosis not present

## 2019-02-28 DIAGNOSIS — I10 Essential (primary) hypertension: Secondary | ICD-10-CM | POA: Diagnosis not present

## 2019-02-28 DIAGNOSIS — R6510 Systemic inflammatory response syndrome (SIRS) of non-infectious origin without acute organ dysfunction: Secondary | ICD-10-CM | POA: Diagnosis not present

## 2019-02-28 DIAGNOSIS — D649 Anemia, unspecified: Secondary | ICD-10-CM

## 2019-02-28 LAB — HEPATIC FUNCTION PANEL
ALT: 19 U/L (ref 0–44)
AST: 45 U/L — ABNORMAL HIGH (ref 15–41)
Albumin: 2.7 g/dL — ABNORMAL LOW (ref 3.5–5.0)
Alkaline Phosphatase: 54 U/L (ref 38–126)
BILIRUBIN DIRECT: 0.5 mg/dL — AB (ref 0.0–0.2)
Indirect Bilirubin: 1.1 mg/dL — ABNORMAL HIGH (ref 0.3–0.9)
Total Bilirubin: 1.6 mg/dL — ABNORMAL HIGH (ref 0.3–1.2)
Total Protein: 6 g/dL — ABNORMAL LOW (ref 6.5–8.1)

## 2019-02-28 LAB — CBC
HCT: 34.5 % — ABNORMAL LOW (ref 36.0–46.0)
Hemoglobin: 11.1 g/dL — ABNORMAL LOW (ref 12.0–15.0)
MCH: 30.4 pg (ref 26.0–34.0)
MCHC: 32.2 g/dL (ref 30.0–36.0)
MCV: 94.5 fL (ref 80.0–100.0)
Platelets: 78 10*3/uL — ABNORMAL LOW (ref 150–400)
RBC: 3.65 MIL/uL — ABNORMAL LOW (ref 3.87–5.11)
RDW: 14.5 % (ref 11.5–15.5)
WBC: 7.1 10*3/uL (ref 4.0–10.5)
nRBC: 0 % (ref 0.0–0.2)

## 2019-02-28 LAB — BASIC METABOLIC PANEL
Anion gap: 7 (ref 5–15)
BUN: 17 mg/dL (ref 8–23)
CO2: 19 mmol/L — ABNORMAL LOW (ref 22–32)
CREATININE: 1.4 mg/dL — AB (ref 0.44–1.00)
Calcium: 8 mg/dL — ABNORMAL LOW (ref 8.9–10.3)
Chloride: 112 mmol/L — ABNORMAL HIGH (ref 98–111)
GFR calc Af Amer: 47 mL/min — ABNORMAL LOW (ref >60)
GFR calc non Af Amer: 40 mL/min — ABNORMAL LOW (ref >60)
Glucose, Bld: 134 mg/dL — ABNORMAL HIGH (ref 70–99)
Potassium: 4.1 mmol/L (ref 3.5–5.1)
Sodium: 138 mmol/L (ref 135–145)

## 2019-02-28 LAB — PROTIME-INR
INR: 1.5 — ABNORMAL HIGH (ref 0.8–1.2)
Prothrombin Time: 18.1 seconds — ABNORMAL HIGH (ref 11.4–15.2)

## 2019-02-28 LAB — IRON AND TIBC
Iron: 65 ug/dL (ref 28–170)
Saturation Ratios: 20 % (ref 10.4–31.8)
TIBC: 328 ug/dL (ref 250–450)
UIBC: 263 ug/dL

## 2019-02-28 LAB — LACTIC ACID, PLASMA: Lactic Acid, Venous: 1.8 mmol/L (ref 0.5–1.9)

## 2019-02-28 LAB — FERRITIN: Ferritin: 47 ng/mL (ref 11–307)

## 2019-02-28 MED ORDER — SODIUM CHLORIDE 0.9 % IV BOLUS
1000.0000 mL | Freq: Once | INTRAVENOUS | Status: AC
  Administered 2019-02-28: 1000 mL via INTRAVENOUS

## 2019-02-28 MED ORDER — ACETAMINOPHEN 650 MG RE SUPP: 650.0000 mg | Freq: Four times a day (QID) | RECTAL | Status: DC | PRN

## 2019-02-28 MED ORDER — SODIUM CHLORIDE 0.9 % IV SOLN
1.0000 g | INTRAVENOUS | Status: DC
  Administered 2019-02-28: 1 g via INTRAVENOUS
  Filled 2019-02-28: qty 1
  Filled 2019-02-28: qty 10

## 2019-02-28 MED ORDER — ACETAMINOPHEN 325 MG PO TABS: 650.0000 mg | ORAL_TABLET | Freq: Four times a day (QID) | ORAL | Status: DC | PRN

## 2019-02-28 MED ORDER — ENOXAPARIN SODIUM 40 MG/0.4ML ~~LOC~~ SOLN
40.0000 mg | SUBCUTANEOUS | Status: DC
  Administered 2019-02-28: 40 mg via SUBCUTANEOUS
  Filled 2019-02-28: qty 0.4

## 2019-02-28 MED ORDER — ONDANSETRON HCL 4 MG/2ML IJ SOLN: 4.0000 mg | Freq: Four times a day (QID) | INTRAMUSCULAR | Status: DC | PRN

## 2019-02-28 MED ORDER — RIFAXIMIN 550 MG PO TABS
550.0000 mg | ORAL_TABLET | Freq: Two times a day (BID) | ORAL | Status: DC
  Administered 2019-02-28 – 2019-03-01 (×3): 550 mg via ORAL
  Filled 2019-02-28 (×4): qty 1

## 2019-02-28 MED ORDER — SODIUM CHLORIDE 0.9 % IV SOLN
INTRAVENOUS | Status: DC
  Administered 2019-02-28: 10:00:00 via INTRAVENOUS

## 2019-02-28 MED ORDER — PANTOPRAZOLE SODIUM 40 MG PO TBEC
40.0000 mg | DELAYED_RELEASE_TABLET | Freq: Every morning | ORAL | Status: DC
  Administered 2019-02-28 – 2019-03-01 (×2): 40 mg via ORAL
  Filled 2019-02-28 (×2): qty 1

## 2019-02-28 MED ORDER — PROMETHAZINE HCL 25 MG/ML IJ SOLN
12.5000 mg | Freq: Four times a day (QID) | INTRAMUSCULAR | Status: DC | PRN
  Administered 2019-02-28: 12.5 mg via INTRAVENOUS
  Filled 2019-02-28: qty 1

## 2019-02-28 MED ORDER — HYDROXYZINE HCL 25 MG PO TABS: 25.0000 mg | ORAL_TABLET | Freq: Three times a day (TID) | ORAL | Status: DC | PRN

## 2019-02-28 NOTE — Progress Notes (Signed)
PROGRESS NOTE    ANALEA Moon  MWU:[id number] DOB: Nov 24, 1956 DOA: 02/27/2019 PCP: Leamon Arnt, MD   Brief Narrative: Dawn Moon is a 63 y.o. female with medical history significant of liver cirrhosis secondary to NASH, GERD, hypertension, IBS. She presented with recurrent cystitis with associated chills and nausea/vomiting. Currently on antibiotics   Assessment & Plan:   Principal Problem:   Recurrent cystitis Active Problems:   Lightheadedness   Dehydration   Anemia   Thrombocytopenia (HCC)   Recurrent cystitis Started on ceftriaxone. Normal WBC. Urine culture pending from outside facility -Continue ceftriaxone -Bcx and Ucx pending  Dehydration Secondary to nausea/vomiting. Received IV fluids. More euvolemic now. -Discontinue IV fluids; encourage oral intake -Hold diuretics  NASH with cirrhosis and ascites Varicies No significant ascites. Elevated bilirubin is stable. INR is elevated. -Repeat INR and obtain hepatic function panel today; consult GI if concerning -Hold nadalol  Thrombocytopenia Chronic but acutely low in setting of infection. No bleeding -Discontinue Lovenox -SCDs -Repeat CBC in AM  Anemia Iron panel unremarkable/normal. Unknown etiology. -Repeat CBC in AM  GERD -Continue Protonix  History of non-sustained ventricular tachycardia Stable. -Hold nadalol   DVT prophylaxis: SCDs Code Status:   Code Status: Full Code Family Communication: Husband at bedside Disposition Plan: Discharge likely in 24 hours pending blood and urine cultures   Consultants:   None  Procedures:   None  Antimicrobials:  Ceftriaxone    Subjective: No issues this morning other than wanting her home medications.  Objective: Vitals:   02/28/19 0036 02/28/19 0135 02/28/19 0507 02/28/19 1216  BP: (!) 115/56 118/74 (!) 97/46 (!) 111/54  Pulse: 94 96 87   Resp: 18 20 16    Temp:  98 F (36.7 C) 98.1 F (36.7 C)   TempSrc:  Oral Oral     SpO2: 94% 97% 94%   Weight:  90.4 kg    Height:  5' 3"  (1.6 m)      Intake/Output Summary (Last 24 hours) at 02/28/2019 1230 Last data filed at 02/28/2019 1141 Gross per 24 hour  Intake 2163.12 ml  Output 100 ml  Net 2063.12 ml   Filed Weights   02/27/19 1800 02/28/19 0135  Weight: 88.5 kg 90.4 kg    Examination:  General exam: Appears calm and comfortable  Respiratory system: Clear to auscultation. Respiratory effort normal. Cardiovascular system: S1 & S2 heard, RRR. No murmurs, rubs, gallops or clicks. Gastrointestinal system: Abdomen is nondistended, soft and nontender. No organomegaly or masses felt. Normal bowel sounds heard. Central nervous system: Alert and oriented. No focal neurological deficits. Extremities: No edema. No calf tenderness Skin: No cyanosis. No rashes Psychiatry: Judgement and insight appear normal. Mood & affect appropriate.     Data Reviewed: I have personally reviewed following labs and imaging studies  CBC: Recent Labs  Lab 02/27/19 2119 02/28/19 0512  WBC 6.1 7.1  NEUTROABS 5.5  --   HGB 11.8* 11.1*  HCT 36.6 34.5*  MCV 93.4 94.5  PLT 94* 78*   Basic Metabolic Panel: Recent Labs  Lab 02/27/19 2119 02/28/19 0512  NA 135 138  K 3.5 4.1  CL 108 112*  CO2 20* 19*  GLUCOSE 87 134*  BUN 15 17  CREATININE 1.58* 1.40*  CALCIUM 8.1* 8.0*   GFR: Estimated Creatinine Clearance: 44.5 mL/min (A) (by C-G formula based on SCr of 1.4 mg/dL (H)). Liver Function Tests: Recent Labs  Lab 02/27/19 2119  AST 43*  ALT 20  ALKPHOS 86  BILITOT 1.7*  PROT 6.3*  ALBUMIN 2.9*   No results for input(s): LIPASE, AMYLASE in the last 168 hours. No results for input(s): AMMONIA in the last 168 hours. Coagulation Profile: Recent Labs  Lab 02/27/19 2315  INR 1.4*   Cardiac Enzymes: No results for input(s): CKTOTAL, CKMB, CKMBINDEX, TROPONINI in the last 168 hours. BNP (last 3 results) No results for input(s): PROBNP in the last 8760  hours. HbA1C: No results for input(s): HGBA1C in the last 72 hours. CBG: No results for input(s): GLUCAP in the last 168 hours. Lipid Profile: No results for input(s): CHOL, HDL, LDLCALC, TRIG, CHOLHDL, LDLDIRECT in the last 72 hours. Thyroid Function Tests: No results for input(s): TSH, T4TOTAL, FREET4, T3FREE, THYROIDAB in the last 72 hours. Anemia Panel: Recent Labs    02/28/19 0512  FERRITIN 47  TIBC 328  IRON 65   Sepsis Labs: Recent Labs  Lab 02/27/19 1941 02/27/19 2141 02/28/19 0512  LATICACIDVEN 2.2* 2.1* 1.8    Recent Results (from the past 240 hour(s))  Culture, blood (routine x 2)     Status: None (Preliminary result)   Collection Time: 02/27/19  8:10 PM  Result Value Ref Range Status   Specimen Description   Final    BLOOD BLOOD LEFT FOREARM Performed at [facility], [address] [place], [place] [address]    Special Requests   Final    BOTTLES DRAWN AEROBIC AND ANAEROBIC Blood Culture adequate volume Performed at [facility], [address] [place], [place] [address]    Culture   Final    NO GROWTH < 12 HOURS Performed at [facility], [place] [address] [place], [place] [address]    Report Status PENDING  Incomplete         Radiology Studies: Ct Renal Stone Study  Result Date: 02/27/2019 CLINICAL DATA:  63 y/o  F; 30 minutes of vomiting and chills. EXAM: CT ABDOMEN AND PELVIS WITHOUT CONTRAST TECHNIQUE: Multidetector CT imaging of the abdomen and pelvis was performed following the standard protocol without IV contrast. COMPARISON:  04/23/2018 CT abdomen and pelvis. FINDINGS: Lower chest: Negative. Hepatobiliary: Lobulated liver contour with enlarged left lobe compatible with cirrhosis. No gallbladder wall thickening or cholelithiasis. No biliary ductal dilatation. No focal liver abnormality is evident on this noncontrast examination. Recanalized umbilical vein as well as omental, gastrohepatic, and lower esophageal portal  caval collaterals. Pancreas: Unremarkable. No pancreatic ductal dilatation or surrounding inflammatory changes. Spleen: Spleen measures 14.1 x 6.7 x 10.1 cm (volume = 500 cm^3), mildly increased in size. Adrenals/Urinary Tract: Adrenal glands are unremarkable. Punctate nonobstructing kidney stones bilaterally. No hydronephrosis or ureter stone. Diffuse bladder wall thickening. Stomach/Bowel: Stomach is within normal limits. Appendix appears normal. No evidence of bowel wall thickening, distention, or inflammatory changes. Vascular/Lymphatic: Aortic atherosclerosis. No enlarged abdominal or pelvic lymph nodes. Reproductive: 10 mm calcification in the right lateral myometrium, likely small underlying myoma. Normal adnexa. Other: No abdominal wall hernia or abnormality. No abdominopelvic ascites. Mild mesenteric edema. Musculoskeletal: No fracture is seen. Multilevel discogenic degenerative changes of the spine and lower lumbar facet arthrosis. IMPRESSION: 1. Punctate nonobstructing kidney stones bilaterally. No hydronephrosis or ureter stone. 2. Diffuse bladder wall thickening compatible with cystitis. 3. Cirrhosis and stigmata of portal hypertension. Mildly increased spleen size, 500 cc. Electronically Signed   By: Kristine Garbe M.D.   On: 02/27/2019 21:28        Scheduled Meds: . enoxaparin (LOVENOX) injection  40 mg Subcutaneous Q24H  Continuous Infusions: . sodium chloride Stopped (02/28/19 0037)  . cefTRIAXone (ROCEPHIN)  IV       LOS: 0 days     Cordelia Poche, MD [facility] 02/28/2019, 12:30 PM  If 7PM-7AM, please contact night-coverage [email or web address]

## 2019-02-28 NOTE — Telephone Encounter (Signed)
Please call pharmacy.  It looks like all of these medications were recently ordered by GI, Dr. Carlean Purl, with refills available? If not, should be refilled by GI.

## 2019-02-28 NOTE — H&P (Signed)
History and Physical    Dawn Moon DOB: 09/12/1956 DOA: 02/27/2019  PCP: Leamon Arnt, MD Patient coming from: Home  Chief Complaint: Chills, vomiting  HPI: Dawn Moon is a 63 y.o. female with medical history significant of liver cirrhosis secondary to NASH, GERD, hypertension, IBS presenting to the hospital for evaluation of chills and vomiting.  Patient was seen by her PCP yesterday for her third symptomatic UTI in a month.  Patient states she has had 3 UTIs in the past 1 month and has previously been treated with Septra and Keflex.  States she went to her PCP again yesterday as she was having chills, muscle aches, NBNB emesis, suprapubic pain, dysuria, and urinary incontinence for the past 2 days.  Denies having any fevers.  Patient reports loose stools sometimes when taking lactulose or antibiotics.  Denies having any diarrhea at present.  Review of Systems: As per HPI otherwise 10 point review of systems negative.  Past Medical History:  Diagnosis Date  . Ascites   . Encephalopathy, hepatic ([place]) 05/13/2018  . GERD (gastroesophageal reflux disease)   . History of exercise stress test    03-07-2010  Stress echo--- no arrhythmias or conduction abnormalilites and negative for ischemia or chest pain  . History of kidney stones   . History of paroxysmal supraventricular tachycardia    episode 2011  consult w/ dr Caryl Comes --  put on atenolol--  per pt no longer an issue  . HTN (hypertension)    cardiologist --  dr Stanford Breed  . IBS (irritable bowel syndrome)    diarrhea  . Nonalcoholic steatohepatitis ([facility])   . Numbness and tingling of left lower extremity    post achilles tendon repair  . OA (osteoarthritis)    knees  . Pleural effusion on right    hepatic hydrothorax  . PONV (postoperative nausea and vomiting)   . Secondary esophageal varices without bleeding ([place]) 06/03/2018  . Vitamin D deficiency   . Wears glasses     Past Surgical History:    Procedure Laterality Date  . ACHILLES TENDON SURGERY Left 06/2016  . BREAST BIOPSY Left 02/2016   benign  . COLONOSCOPY  2005  . CT CTA CORONARY W/CA SCORE W/CM &/OR WO/CM  01/01/2014   non-obstructive calcified plaque in pLAD (0-25%), no significant incidental noncardiac findings noted  . ESOPHAGOGASTRODUODENOSCOPY  01/2014  . EXTRACORPOREAL SHOCK WAVE LITHOTRIPSY  2010  . KNEE ARTHROPLASTY Right 03/24/2018   Procedure: RIGHT TOTAL KNEE ARTHROPLASTY WITH COMPUTER NAVIGATION;  Surgeon: Rod Can, MD;  Location: [facility];  Service: Orthopedics;  Laterality: Right;  Needs RNFA  . KNEE ARTHROSCOPY Right 12/04/2016   Procedure: ARTHROSCOPY KNEE WITH PARTIAL MEDIAL MENISCECTOMY;  Surgeon: Rod Can, MD;  Location: [place];  Service: Orthopedics;  Laterality: Right;  . LEFT HEART CATHETERIZATION WITH CORONARY ANGIOGRAM N/A 01/11/2012   Procedure: LEFT HEART CATHETERIZATION WITH CORONARY ANGIOGRAM;  Surgeon: Sherren Mocha, MD;  Location: [facility] CATH LAB;  Service: Cardiovascular;  Laterality: N/A;  widely patent coronary arterires without significant obstructive CAD,  normal LVF, ef 55-56%  . TONSILLECTOMY AND ADENOIDECTOMY  child  . TRANSTHORACIC ECHOCARDIOGRAM  01/18/2012   mild LVH,  ef 60%/  trivial TR  . TUBAL LIGATION  1985     reports that she has never smoked. She has never used smokeless tobacco. She reports previous alcohol use. She reports that she does not use drugs.  No Known Allergies  Family History  Problem Relation Age of  Onset  . Lymphoma Mother        non-hodgkins - died @ 73  . Coronary artery disease Mother   . Coronary artery disease Father        CABG in his 25s, died @ 61  . Diabetes Father   . Healthy Brother   . Hypertension Sister   . Arthritis Sister   . IgA nephropathy Daughter   . Celiac disease Daughter     Prior to Admission medications   Medication Sig Start Date End Date Taking? Authorizing Provider  Butalbital-APAP-Caffeine  50-300-40 MG CAPS Take 1 tablet by mouth every 8 (eight) hours as needed. 01/10/18   Hawks, Christy A, FNP  Eluxadoline (VIBERZI) 75 MG TABS Take 75 mg by mouth 2 (two) times daily. Patient taking differently: Take 75 mg by mouth as needed.  06/27/18   Gatha Mayer, MD  furosemide (LASIX) 40 MG tablet TAKE 1 TABLET DAILY 02/08/19   Gatha Mayer, MD  hydrOXYzine (ATARAX/VISTARIL) 25 MG tablet TAKE 1 TABLET THREE TIMES DAILY AS NEEDED FOR ANXIETY 02/08/19   Leamon Arnt, MD  lactulose (CHRONULAC) 10 GM/15ML solution Take 15 mLs (10 g total) by mouth 2 (two) times daily. 06/22/18   Gatha Mayer, MD  nadolol (CORGARD) 20 MG tablet TAKE 1/2 TO 1 TABLET ONCE DAILY AS DIRECTED 02/08/19   Gatha Mayer, MD  pantoprazole (PROTONIX) 40 MG tablet TAKE 1 TABLET DAILY IN THE MORNING 10/19/18   Gatha Mayer, MD  promethazine (PHENERGAN) 25 MG tablet Take 1 tablet (25 mg total) by mouth every 6 (six) hours as needed for nausea or vomiting. 09/16/18   Leamon Arnt, MD  rifaximin (XIFAXAN) 550 MG TABS tablet Take 1 tablet (550 mg total) by mouth 2 (two) times daily. 05/25/18   Zehr, Janett Billow D, PA-C  spironolactone (ALDACTONE) 100 MG tablet TAKE 1 TABLET DAILY 10/20/18   Gatha Mayer, MD  sulfamethoxazole-trimethoprim (BACTRIM DS,SEPTRA DS) 800-160 MG tablet Take 1 tablet by mouth 2 (two) times daily. 02/27/19   Midge Minium, MD  tapentadol (NUCYNTA) 50 MG tablet Take 50 mg by mouth.    [provider]  traZODone (DESYREL) 50 MG tablet Take 0.5-1 tablets (25-50 mg total) by mouth at bedtime as needed for sleep. 10/20/18   Leamon Arnt, MD    Physical Exam: Vitals:   02/27/19 2029 02/27/19 2311 02/28/19 0036 02/28/19 0135  BP: 127/65 114/65 (!) 115/56 118/74  Pulse: 94 100 94 96  Resp: 18 18 18 20   Temp: 99.8 F (37.7 C) 98.5 F (36.9 C)  98 F (36.7 C)  TempSrc: Oral Oral  Oral  SpO2: 95% 95% 94% 97%  Weight:    90.4 kg  Height:    5' 3"  (1.6 m)    Physical Exam    Constitutional: She is oriented to person, place, and time. She appears well-developed and well-nourished.  HENT:  Head: Normocephalic.  Slightly dry mucous membranes  Eyes: Right eye exhibits no discharge. Left eye exhibits no discharge.  Neck: Neck supple.  Cardiovascular: Normal rate, regular rhythm and intact distal pulses.  Pulmonary/Chest: Effort normal and breath sounds normal. No respiratory distress. She has no wheezes.  Abdominal: Soft. Bowel sounds are normal. She exhibits no distension. There is abdominal tenderness. There is no rebound and no guarding.  Mild generalized tenderness to palpation  Musculoskeletal:        General: No edema.  Neurological: She is alert and oriented to person,  place, and time.  Skin: Skin is warm and dry. She is not diaphoretic.     Labs on Admission: I have personally reviewed following labs and imaging studies  CBC: Recent Labs  Lab 02/27/19 2119  WBC 6.1  NEUTROABS 5.5  HGB 11.8*  HCT 36.6  MCV 93.4  PLT 94*   Basic Metabolic Panel: Recent Labs  Lab 02/27/19 2119  NA 135  K 3.5  CL 108  CO2 20*  GLUCOSE 87  BUN 15  CREATININE 1.58*  CALCIUM 8.1*   GFR: Estimated Creatinine Clearance: 39.4 mL/min (A) (by C-G formula based on SCr of 1.58 mg/dL (H)). Liver Function Tests: Recent Labs  Lab 02/27/19 2119  AST 43*  ALT 20  ALKPHOS 86  BILITOT 1.7*  PROT 6.3*  ALBUMIN 2.9*   No results for input(s): LIPASE, AMYLASE in the last 168 hours. No results for input(s): AMMONIA in the last 168 hours. Coagulation Profile: Recent Labs  Lab 02/27/19 2315  INR 1.4*   Cardiac Enzymes: No results for input(s): CKTOTAL, CKMB, CKMBINDEX, TROPONINI in the last 168 hours. BNP (last 3 results) No results for input(s): PROBNP in the last 8760 hours. HbA1C: No results for input(s): HGBA1C in the last 72 hours. CBG: No results for input(s): GLUCAP in the last 168 hours. Lipid Profile: No results for input(s): CHOL, HDL,  LDLCALC, TRIG, CHOLHDL, LDLDIRECT in the last 72 hours. Thyroid Function Tests: No results for input(s): TSH, T4TOTAL, FREET4, T3FREE, THYROIDAB in the last 72 hours. Anemia Panel: No results for input(s): VITAMINB12, FOLATE, FERRITIN, TIBC, IRON, RETICCTPCT in the last 72 hours. Urine analysis:    Component Value Date/Time   COLORURINE ORANGE (A) 02/27/2019 1836   APPEARANCEUR CLOUDY (A) 02/27/2019 1836   LABSPEC >1.030 (H) 02/27/2019 1836   PHURINE 5.0 02/27/2019 1836   GLUCOSEU 100 (A) 02/27/2019 1836   HGBUR TRACE (A) 02/27/2019 1836   BILIRUBINUR MODERATE (A) 02/27/2019 1836   BILIRUBINUR 2+ 02/27/2019 1341   KETONESUR 15 (A) 02/27/2019 1836   PROTEINUR 100 (A) 02/27/2019 1836   UROBILINOGEN 2.0 (A) 02/27/2019 1341   UROBILINOGEN 0.2 01/10/2012 1844   NITRITE POSITIVE (A) 02/27/2019 1836   LEUKOCYTESUR MODERATE (A) 02/27/2019 1836    Radiological Exams on Admission: Ct Renal Stone Study  Result Date: 02/27/2019 CLINICAL DATA:  63 y/o  F; 30 minutes of vomiting and chills. EXAM: CT ABDOMEN AND PELVIS WITHOUT CONTRAST TECHNIQUE: Multidetector CT imaging of the abdomen and pelvis was performed following the standard protocol without IV contrast. COMPARISON:  04/23/2018 CT abdomen and pelvis. FINDINGS: Lower chest: Negative. Hepatobiliary: Lobulated liver contour with enlarged left lobe compatible with cirrhosis. No gallbladder wall thickening or cholelithiasis. No biliary ductal dilatation. No focal liver abnormality is evident on this noncontrast examination. Recanalized umbilical vein as well as omental, gastrohepatic, and lower esophageal portal caval collaterals. Pancreas: Unremarkable. No pancreatic ductal dilatation or surrounding inflammatory changes. Spleen: Spleen measures 14.1 x 6.7 x 10.1 cm (volume = 500 cm^3), mildly increased in size. Adrenals/Urinary Tract: Adrenal glands are unremarkable. Punctate nonobstructing kidney stones bilaterally. No hydronephrosis or ureter stone.  Diffuse bladder wall thickening. Stomach/Bowel: Stomach is within normal limits. Appendix appears normal. No evidence of bowel wall thickening, distention, or inflammatory changes. Vascular/Lymphatic: Aortic atherosclerosis. No enlarged abdominal or pelvic lymph nodes. Reproductive: 10 mm calcification in the right lateral myometrium, likely small underlying myoma. Normal adnexa. Other: No abdominal wall hernia or abnormality. No abdominopelvic ascites. Mild mesenteric edema. Musculoskeletal: No fracture is seen. Multilevel discogenic  degenerative changes of the spine and lower lumbar facet arthrosis. IMPRESSION: 1. Punctate nonobstructing kidney stones bilaterally. No hydronephrosis or ureter stone. 2. Diffuse bladder wall thickening compatible with cystitis. 3. Cirrhosis and stigmata of portal hypertension. Mildly increased spleen size, 500 cc. Electronically Signed   By: Kristine Garbe M.D.   On: 02/27/2019 21:28    Assessment/Plan Principal Problem:   Recurrent cystitis Active Problems:   Lightheadedness   Dehydration   Anemia   Thrombocytopenia (HCC)   Recurrent cystitis -3 UTIs in the past 1 month.  She has failed outpatient treatment with Keflex and Septra. -Afebrile and hemodynamically stable.  No leukocytosis.  Lactic acid 2.2 > 2.1 with IV fluid. -UA (not clean catch) with moderate amount of leukocytes, positive nitrite, greater than 50 WBCs, and many bacteria on microscopic examination. -CT renal stone study with diffuse bladder wall thickening compatible with cystitis; punctate nonobstructing kidney stones bilaterally.  No hydronephrosis or ureter stone. -Continue ceftriaxone  -Continue IV fluid hydration -Urine culture not collected in the ED.  Received a dose of ceftriaxone.  Will reorder urine culture at this time. -Tylenol PRN -Repeat lactic acid level -Blood culture x2 pending  Lightheadedness Likely related to dehydration from emesis.  Patient felt lightheaded  when standing in the ED.  Orthostatics checked at that time negative. -IV fluid hydration -Repeat orthostatics in a.m.  Dehydration Creatinine 1.5, baseline 1.3. -Continue IV fluid hydration -Continue to monitor BMP  Anemia Likely related to liver cirrhosis.  Hemoglobin 11.8, was 13.6 two months ago.  No signs of active bleeding. -Check iron, ferritin, TIBC -Continue to monitor CBC  Acute on chronic thrombocytopenia Likely a result of portal hypertension and congestive splenomegaly from liver cirrhosis.  Platelet count 94,000, previously in the 140,000 range.  No signs of active bleeding. -Continue to monitor CBC  Liver cirrhosis secondary to NASH -AST borderline elevated consistent with prior labs.  T bili 1.7, previously elevated as well.  ALT and ALP normal. -Seen by GI on an outpatient basis.  Patient will need continued follow-up.  DVT prophylaxis: Lovenox Code Status: Patient wishes to be full code. Family Communication: Family at bedside. Disposition Plan: Anticipate discharge after clinical improvement. Consults called: None Admission status: Observation   Shela Leff MD [facility] Pager [phone]  If 7PM-7AM, please contact night-coverage [email or web address] Password [email or web address]  02/28/2019, 2:45 AM

## 2019-03-01 DIAGNOSIS — D696 Thrombocytopenia, unspecified: Secondary | ICD-10-CM

## 2019-03-01 DIAGNOSIS — E860 Dehydration: Secondary | ICD-10-CM

## 2019-03-01 DIAGNOSIS — N3090 Cystitis, unspecified without hematuria: Secondary | ICD-10-CM | POA: Diagnosis not present

## 2019-03-01 LAB — COMPREHENSIVE METABOLIC PANEL
ALK PHOS: 57 U/L (ref 38–126)
ALT: 17 U/L (ref 0–44)
AST: 32 U/L (ref 15–41)
Albumin: 2.5 g/dL — ABNORMAL LOW (ref 3.5–5.0)
Anion gap: 4 — ABNORMAL LOW (ref 5–15)
BILIRUBIN TOTAL: 1.2 mg/dL (ref 0.3–1.2)
BUN: 20 mg/dL (ref 8–23)
CO2: 20 mmol/L — ABNORMAL LOW (ref 22–32)
CREATININE: 1.3 mg/dL — AB (ref 0.44–1.00)
Calcium: 8.1 mg/dL — ABNORMAL LOW (ref 8.9–10.3)
Chloride: 112 mmol/L — ABNORMAL HIGH (ref 98–111)
GFR calc Af Amer: 51 mL/min — ABNORMAL LOW (ref >60)
GFR calc non Af Amer: 44 mL/min — ABNORMAL LOW (ref >60)
Glucose, Bld: 111 mg/dL — ABNORMAL HIGH (ref 70–99)
Potassium: 3.5 mmol/L (ref 3.5–5.1)
Sodium: 136 mmol/L (ref 135–145)
Total Protein: 5.8 g/dL — ABNORMAL LOW (ref 6.5–8.1)

## 2019-03-01 LAB — CBC
HCT: 33.6 % — ABNORMAL LOW (ref 36.0–46.0)
Hemoglobin: 10.9 g/dL — ABNORMAL LOW (ref 12.0–15.0)
MCH: 30.8 pg (ref 26.0–34.0)
MCHC: 32.4 g/dL (ref 30.0–36.0)
MCV: 94.9 fL (ref 80.0–100.0)
Platelets: 78 10*3/uL — ABNORMAL LOW (ref 150–400)
RBC: 3.54 MIL/uL — ABNORMAL LOW (ref 3.87–5.11)
RDW: 15 % (ref 11.5–15.5)
WBC: 5.2 10*3/uL (ref 4.0–10.5)
nRBC: 0 % (ref 0.0–0.2)

## 2019-03-01 LAB — URINE CULTURE
MICRO NUMBER: 294527
SPECIMEN QUALITY:: ADEQUATE

## 2019-03-01 LAB — PROTIME-INR
INR: 1.2 (ref 0.8–1.2)
Prothrombin Time: 15.5 seconds — ABNORMAL HIGH (ref 11.4–15.2)

## 2019-03-01 MED ORDER — CEFDINIR 300 MG PO CAPS: 300.0000 mg | ORAL_CAPSULE | Freq: Two times a day (BID) | ORAL | 0 refills | Status: AC

## 2019-03-01 NOTE — Discharge Instructions (Signed)
Dawn Moon,  You were in the hospital because of a bladder infection. You were started on antibiotics with good improvement. You will be discharged on antibiotics as well. Your blood pressure was been low which has prohibited the use of your home nadalol, lasix and spironolactone but it rebounded prior to discharge. Please check your blood pressure daily and hold your diuretics (lasix and spironolactone) if blood pressure is less than 100/60 mmHg.

## 2019-03-01 NOTE — Evaluation (Signed)
Physical Therapy Evaluation-1x Patient Details Name: Dawn Moon MRN: [id number] DOB: Jul 04, 1956 Today's Date: 03/01/2019   History of Present Illness  63 yo female admitted with recurrent cystitis, dehydration. Hx of liver cirrhosis, NASH, comp fx, R TKA 2019  Clinical Impression  On eval, pt was Mod Ind with mobility. She walked ~250 feet around the unit. No acute PT needs. 1x eval. Will sign off.     Follow Up Recommendations No PT follow up    Equipment Recommendations  None recommended by PT    Recommendations for Other Services       Precautions / Restrictions Precautions Precautions: Fall Restrictions Weight Bearing Restrictions: No      Mobility  Bed Mobility Overal bed mobility: Modified Independent                Transfers Overall transfer level: Modified independent                  Ambulation/Gait Ambulation/Gait assistance: Modified independent (Device/Increase time) Gait Distance (Feet): 250 Feet Assistive device: None       General Gait Details: slow gait speed. dyspnea 2/4. O2 sat level WNL. Pt c/o mild lightheadedness 2* low BP  Stairs            Wheelchair Mobility    Modified Rankin (Stroke Patients Only)       Balance Overall balance assessment: Mild deficits observed, not formally tested                                           Pertinent Vitals/Pain Pain Assessment: No/denies pain    Home Living Family/patient expects to be discharged to:: Private residence Living Arrangements: Spouse/significant other Available Help at Discharge: Family Type of Home: House Home Access: Stairs to enter Entrance Stairs-Rails: Psychiatric nurse of Steps: 3 Home Layout: One level Home Equipment: Environmental consultant - 2 wheels;Cane - single point Additional Comments: had been going to OPPT 3x wk until 5/4 hospital admission    Prior Function Level of Independence: Independent with assistive  device(s)         Comments: used cane prn     Hand Dominance        Extremity/Trunk Assessment   Upper Extremity Assessment Upper Extremity Assessment: Overall WFL for tasks assessed    Lower Extremity Assessment Lower Extremity Assessment: Overall WFL for tasks assessed    Cervical / Trunk Assessment Cervical / Trunk Assessment: Normal  Communication   Communication: No difficulties  Cognition Arousal/Alertness: Awake/alert Behavior During Therapy: WFL for tasks assessed/performed Overall Cognitive Status: Within Functional Limits for tasks assessed                                        General Comments      Exercises     Assessment/Plan    PT Assessment Patent does not need any further PT services  PT Problem List         PT Treatment Interventions      PT Goals (Current goals can be found in the Care Plan section)  Acute Rehab PT Goals Patient Stated Goal: home PT Goal Formulation: All assessment and education complete, DC therapy    Frequency     Barriers to discharge        Co-evaluation  AM-PAC PT "6 Clicks" Mobility  Outcome Measure Help needed turning from your back to your side while in a flat bed without using bedrails?: None Help needed moving from lying on your back to sitting on the side of a flat bed without using bedrails?: None Help needed moving to and from a bed to a chair (including a wheelchair)?: None Help needed standing up from a chair using your arms (e.g., wheelchair or bedside chair)?: None Help needed to walk in hospital room?: None Help needed climbing 3-5 steps with a railing? : None 6 Click Score: 24    End of Session   Activity Tolerance: Patient tolerated treatment well Patient left: in chair;with call bell/phone within reach        Time: 1152-1201 PT Time Calculation (min) (ACUTE ONLY): 9 min   Charges:   PT Evaluation $PT Eval Low Complexity: Flan[place], PT Acute Rehabilitation Services Pager: [phone] Office: [phone]

## 2019-03-01 NOTE — Progress Notes (Signed)
Reviewed discharge summary, follow up appointments, current medication regimen, and where to pick up prescription with patient & husband. Patient declined to be escorted in wheelchair, walked down with husband.

## 2019-03-01 NOTE — Discharge Summary (Signed)
Physician Discharge Summary  KASLYN RICHBURG FXO:[id number] DOB: 12-07-1956 DOA: 02/27/2019  PCP: Leamon Arnt, MD  Admit date: 02/27/2019 Discharge date: 03/01/2019  Admitted From: Home Disposition: Home  Recommendations for Outpatient Follow-up:  1. Follow up with PCP in 1 week 2. Please obtain BMP/CBC in one week 3. Check blood pressure daily 4. Please follow up on the following pending results: Urine culture/blood culture  Home Health: None Equipment/Devices: None  Discharge Condition: Stable CODE STATUS: Full code Diet recommendation: Low sodium   Brief/Interim Summary:  Admission HPI written by Toy Baker, MD   Chief Complaint: Chills, vomiting  HPI: RUPA LAGAN is a 63 y.o. female with medical history significant of liver cirrhosis secondary to NASH, GERD, hypertension, IBS presenting to the hospital for evaluation of chills and vomiting.  Patient was seen by her PCP yesterday for her third symptomatic UTI in a month.  Patient states she has had 3 UTIs in the past 1 month and has previously been treated with Septra and Keflex.  States she went to her PCP again yesterday as she was having chills, muscle aches, NBNB emesis, suprapubic pain, dysuria, and urinary incontinence for the past 2 days.  Denies having any fevers.  Patient reports loose stools sometimes when taking lactulose or antibiotics.  Denies having any diarrhea at present.   Hospital course:  Recurrent cystitis Started on ceftriaxone empirically. Normal WBC. Urine culture from outpatient significant for klebsiella pneumoniae. Sensitive to 3rd generation cephalosporin. Discharged on cefdinir. Inpatient urine culture and blood cultures final results pending.  Dehydration Secondary to nausea/vomiting. Received IV fluids.  NASH with cirrhosis and ascites Varicies No significant ascites. Elevated bilirubin is stable. INR is elevated on admission which returned to baseline. Nadolol held on  admission secondary to low blood pressure. Blood pressure rebounded prior to discharge and Nadolol resumed on discharge.  Thrombocytopenia Chronic but acutely low in setting of infection. No bleeding. Stable. Repeat CBC as an outpatient.  Anemia Iron panel unremarkable/normal. Unknown etiology. Repeat CBC as an outpatient.  GERD Continue Protonix  History of non-sustained ventricular tachycardia Stable. Nadolol held as mentioned above. Resumed on discharge  Discharge Diagnoses:  Principal Problem:   Recurrent cystitis Active Problems:   Lightheadedness   Dehydration   Anemia   Thrombocytopenia ([place])    Discharge Instructions   Allergies as of 03/01/2019   No Known Allergies     Medication List    STOP taking these medications   sulfamethoxazole-trimethoprim 800-160 MG tablet Commonly known as:  BACTRIM DS,SEPTRA DS     TAKE these medications   Butalbital-APAP-Caffeine 50-300-40 MG Caps Take 1 tablet by mouth every 8 (eight) hours as needed. What changed:  reasons to take this   cefdinir 300 MG capsule Commonly known as:  OMNICEF Take 1 capsule (300 mg total) by mouth 2 (two) times daily for 5 days.   Eluxadoline 75 MG Tabs Commonly known as:  Viberzi Take 75 mg by mouth 2 (two) times daily.   furosemide 40 MG tablet Commonly known as:  LASIX TAKE 1 TABLET DAILY   hydrOXYzine 25 MG tablet Commonly known as:  ATARAX/VISTARIL TAKE 1 TABLET THREE TIMES DAILY AS NEEDED FOR ANXIETY What changed:  See the new instructions.   lactulose 10 GM/15ML solution Commonly known as:  CHRONULAC Take 15 mLs (10 g total) by mouth 2 (two) times daily. What changed:    when to take this  reasons to take this   nadolol 20 MG tablet Commonly  known as:  CORGARD TAKE 1/2 TO 1 TABLET ONCE DAILY AS DIRECTED What changed:  See the new instructions.   pantoprazole 40 MG tablet Commonly known as:  PROTONIX TAKE 1 TABLET DAILY IN THE MORNING What changed:  when to  take this   promethazine 25 MG tablet Commonly known as:  PHENERGAN Take 1 tablet (25 mg total) by mouth every 6 (six) hours as needed for nausea or vomiting.   rifaximin 550 MG Tabs tablet Commonly known as:  XIFAXAN Take 1 tablet (550 mg total) by mouth 2 (two) times daily.   spironolactone 100 MG tablet Commonly known as:  ALDACTONE TAKE 1 TABLET DAILY What changed:  when to take this   traZODone 50 MG tablet Commonly known as:  DESYREL Take 0.5-1 tablets (25-50 mg total) by mouth at bedtime as needed for sleep.      Follow-up Information    Leamon Arnt, MD. Schedule an appointment as soon as possible for a visit in 1 week(s).   Specialty:  Family Medicine Contact information: [address] [place] [address] [phone]          No Known Allergies  Consultations:  None   Procedures/Studies: Ct Renal Stone Study  Result Date: 02/27/2019 CLINICAL DATA:  63 y/o  F; 30 minutes of vomiting and chills. EXAM: CT ABDOMEN AND PELVIS WITHOUT CONTRAST TECHNIQUE: Multidetector CT imaging of the abdomen and pelvis was performed following the standard protocol without IV contrast. COMPARISON:  04/23/2018 CT abdomen and pelvis. FINDINGS: Lower chest: Negative. Hepatobiliary: Lobulated liver contour with enlarged left lobe compatible with cirrhosis. No gallbladder wall thickening or cholelithiasis. No biliary ductal dilatation. No focal liver abnormality is evident on this noncontrast examination. Recanalized umbilical vein as well as omental, gastrohepatic, and lower esophageal portal caval collaterals. Pancreas: Unremarkable. No pancreatic ductal dilatation or surrounding inflammatory changes. Spleen: Spleen measures 14.1 x 6.7 x 10.1 cm (volume = 500 cm^3), mildly increased in size. Adrenals/Urinary Tract: Adrenal glands are unremarkable. Punctate nonobstructing kidney stones bilaterally. No hydronephrosis or ureter stone. Diffuse bladder wall thickening. Stomach/Bowel:  Stomach is within normal limits. Appendix appears normal. No evidence of bowel wall thickening, distention, or inflammatory changes. Vascular/Lymphatic: Aortic atherosclerosis. No enlarged abdominal or pelvic lymph nodes. Reproductive: 10 mm calcification in the right lateral myometrium, likely small underlying myoma. Normal adnexa. Other: No abdominal wall hernia or abnormality. No abdominopelvic ascites. Mild mesenteric edema. Musculoskeletal: No fracture is seen. Multilevel discogenic degenerative changes of the spine and lower lumbar facet arthrosis. IMPRESSION: 1. Punctate nonobstructing kidney stones bilaterally. No hydronephrosis or ureter stone. 2. Diffuse bladder wall thickening compatible with cystitis. 3. Cirrhosis and stigmata of portal hypertension. Mildly increased spleen size, 500 cc. Electronically Signed   By: Kristine Garbe M.D.   On: 02/27/2019 21:28      Subjective: Feels much better today. No concerns.  Discharge Exam: Vitals:   02/28/19 2216 03/01/19 0532  BP: (!) 95/54 (!) 93/55  Pulse: 88 77  Resp: 17 15  Temp: 98.3 F (36.8 C) 98.1 F (36.7 C)  SpO2: 98% 97%   Vitals:   02/28/19 1216 02/28/19 1434 02/28/19 2216 03/01/19 0532  BP: (!) 111/54 107/60 (!) 95/54 (!) 93/55  Pulse:  80 88 77  Resp:  18 17 15   Temp:  98 F (36.7 C) 98.3 F (36.8 C) 98.1 F (36.7 C)  TempSrc:  Oral Oral Oral  SpO2:  96% 98% 97%  Weight:      Height:  General: Pt is alert, awake, not in acute distress Cardiovascular: RRR, S1/S2 +, no rubs, no gallops Respiratory: CTA bilaterally, no wheezing, no rhonchi Abdominal: Soft, NT, ND, bowel sounds + Extremities: no edema, no cyanosis    The results of significant diagnostics from this hospitalization (including imaging, microbiology, ancillary and laboratory) are listed below for reference.     Microbiology: Recent Results (from the past 240 hour(s))  Urine Culture     Status: Abnormal   Collection Time:  02/27/19  2:14 PM  Result Value Ref Range Status   MICRO NUMBER: [id number]  Final   SPECIMEN QUALITY: Adequate  Final   Sample Source NOT GIVEN  Final   STATUS: FINAL  Final   ISOLATE 1: Klebsiella pneumoniae (A)  Final    Comment: 10,000-50,000 CFU/mL of Klebsiella pneumoniae      Susceptibility   Klebsiella pneumoniae - URINE CULTURE, REFLEX    AMOX/CLAVULANIC <=2 Sensitive     AMPICILLIN 16 Resistant     AMPICILLIN/SULBACTAM 4 Sensitive     CEFAZOLIN* <=4 Not Reportable      * For infections other than uncomplicated UTIcaused by E. coli, K. pneumoniae or P. mirabilis:Cefazolin is resistant if MIC > or = 8 mcg/mL.(Distinguishing susceptible versus intermediatefor isolates with MIC < or = 4 mcg/mL requiresadditional testing.)For uncomplicated UTI caused by E. coli,K. pneumoniae or P. mirabilis: Cefazolin issusceptible if MIC <32 mcg/mL and predictssusceptible to the oral agents cefaclor, cefdinir,cefpodoxime, cefprozil, cefuroxime, cephalexinand loracarbef.    CEFEPIME <=1 Sensitive     CEFTRIAXONE <=1 Sensitive     CIPROFLOXACIN <=0.25 Sensitive     LEVOFLOXACIN <=0.12 Sensitive     ERTAPENEM <=0.5 Sensitive     GENTAMICIN <=1 Sensitive     IMIPENEM <=0.25 Sensitive     NITROFURANTOIN 32 Sensitive     PIP/TAZO <=4 Sensitive     TOBRAMYCIN <=1 Sensitive     TRIMETH/SULFA* <=20 Sensitive      * For infections other than uncomplicated UTIcaused by E. coli, K. pneumoniae or P. mirabilis:Cefazolin is resistant if MIC > or = 8 mcg/mL.(Distinguishing susceptible versus intermediatefor isolates with MIC < or = 4 mcg/mL requiresadditional testing.)For uncomplicated UTI caused by E. coli,K. pneumoniae or P. mirabilis: Cefazolin issusceptible if MIC <32 mcg/mL and predictssusceptible to the oral agents cefaclor, cefdinir,cefpodoxime, cefprozil, cefuroxime, cephalexinand loracarbef.Legend:S = Susceptible  I = IntermediateR = Resistant  NS = Not susceptible* = Not tested  NR = Not reported**NN = See  antimicrobic comments  Culture, blood (routine x 2)     Status: None (Preliminary result)   Collection Time: 02/27/19  8:10 PM  Result Value Ref Range Status   Specimen Description   Final    BLOOD BLOOD LEFT FOREARM Performed at [facility], [address] [place], [place] [address]    Special Requests   Final    BOTTLES DRAWN AEROBIC AND ANAEROBIC Blood Culture adequate volume Performed at [facility], [address] [place], [place] [address]    Culture   Final    NO GROWTH 2 DAYS Performed at [facility], [place] [address], [place], [place] [address]    Report Status PENDING  Incomplete  Culture, blood (routine x 2)     Status: None (Preliminary result)   Collection Time: 02/27/19  9:19 PM  Result Value Ref Range Status   Specimen Description   Final    BLOOD LEFT ARM Performed at [facility], [address],  [place], [place] [address]    Special Requests   Final    BOTTLES DRAWN AEROBIC AND ANAEROBIC Blood Culture adequate volume Performed at [facility], [address] [place], [place] [address]    Culture   Final    NO GROWTH < 24 HOURS Performed at [facility], [place] [address], [place], [place] [address]    Report Status PENDING  Incomplete     Labs: BNP (last 3 results) Recent Labs    05/13/18 1958  BNP 23.7   Basic Metabolic Panel: Recent Labs  Lab 02/27/19 2119 02/28/19 0512 03/01/19 1056  NA 135 138 136  K 3.5 4.1 3.5  CL 108 112* 112*  CO2 20* 19* 20*  GLUCOSE 87 134* 111*  BUN 15 17 20   CREATININE 1.58* 1.40* 1.30*  CALCIUM 8.1* 8.0* 8.1*   Liver Function Tests: Recent Labs  Lab 02/27/19 2119 02/28/19 1316 03/01/19 1056  AST 43* 45* 32  ALT 20 19 17   ALKPHOS 86 54 57  BILITOT 1.7* 1.6* 1.2  PROT 6.3* 6.0* 5.8*  ALBUMIN 2.9* 2.7* 2.5*   No results for input(s): LIPASE, AMYLASE in the last 168 hours. No results for input(s): AMMONIA in the last 168 hours. CBC: Recent Labs   Lab 02/27/19 2119 02/28/19 0512 03/01/19 1056  WBC 6.1 7.1 5.2  NEUTROABS 5.5  --   --   HGB 11.8* 11.1* 10.9*  HCT 36.6 34.5* 33.6*  MCV 93.4 94.5 94.9  PLT 94* 78* 78*   Cardiac Enzymes: No results for input(s): CKTOTAL, CKMB, CKMBINDEX, TROPONINI in the last 168 hours. BNP: Invalid input(s): POCBNP CBG: No results for input(s): GLUCAP in the last 168 hours. D-Dimer No results for input(s): DDIMER in the last 72 hours. Hgb A1c No results for input(s): HGBA1C in the last 72 hours. Lipid Profile No results for input(s): CHOL, HDL, LDLCALC, TRIG, CHOLHDL, LDLDIRECT in the last 72 hours. Thyroid function studies No results for input(s): TSH, T4TOTAL, T3FREE, THYROIDAB in the last 72 hours.  Invalid input(s): FREET3 Anemia work up Recent Labs    02/28/19 0512  FERRITIN 47  TIBC 328  IRON 65   Urinalysis    Component Value Date/Time   COLORURINE ORANGE (A) 02/27/2019 1836   APPEARANCEUR CLOUDY (A) 02/27/2019 1836   LABSPEC >1.030 (H) 02/27/2019 1836   PHURINE 5.0 02/27/2019 1836   GLUCOSEU 100 (A) 02/27/2019 1836   HGBUR TRACE (A) 02/27/2019 1836   BILIRUBINUR MODERATE (A) 02/27/2019 1836   BILIRUBINUR 2+ 02/27/2019 1341   KETONESUR 15 (A) 02/27/2019 1836   PROTEINUR 100 (A) 02/27/2019 1836   UROBILINOGEN 2.0 (A) 02/27/2019 1341   UROBILINOGEN 0.2 01/10/2012 1844   NITRITE POSITIVE (A) 02/27/2019 1836   LEUKOCYTESUR MODERATE (A) 02/27/2019 1836   Sepsis Labs Invalid input(s): PROCALCITONIN,  WBC,  LACTICIDVEN Microbiology Recent Results (from the past 240 hour(s))  Urine Culture     Status: Abnormal   Collection Time: 02/27/19  2:14 PM  Result Value Ref Range Status   MICRO NUMBER: [id number]  Final   SPECIMEN QUALITY: Adequate  Final   Sample Source NOT GIVEN  Final   STATUS: FINAL  Final   ISOLATE 1: Klebsiella pneumoniae (A)  Final    Comment: 10,000-50,000 CFU/mL of Klebsiella pneumoniae      Susceptibility   Klebsiella pneumoniae - URINE CULTURE,  REFLEX    AMOX/CLAVULANIC <=2 Sensitive     AMPICILLIN 16 Resistant     AMPICILLIN/SULBACTAM 4 Sensitive  CEFAZOLIN* <=4 Not Reportable      * For infections other than uncomplicated UTIcaused by E. coli, K. pneumoniae or P. mirabilis:Cefazolin is resistant if MIC > or = 8 mcg/mL.(Distinguishing susceptible versus intermediatefor isolates with MIC < or = 4 mcg/mL requiresadditional testing.)For uncomplicated UTI caused by E. coli,K. pneumoniae or P. mirabilis: Cefazolin issusceptible if MIC <32 mcg/mL and predictssusceptible to the oral agents cefaclor, cefdinir,cefpodoxime, cefprozil, cefuroxime, cephalexinand loracarbef.    CEFEPIME <=1 Sensitive     CEFTRIAXONE <=1 Sensitive     CIPROFLOXACIN <=0.25 Sensitive     LEVOFLOXACIN <=0.12 Sensitive     ERTAPENEM <=0.5 Sensitive     GENTAMICIN <=1 Sensitive     IMIPENEM <=0.25 Sensitive     NITROFURANTOIN 32 Sensitive     PIP/TAZO <=4 Sensitive     TOBRAMYCIN <=1 Sensitive     TRIMETH/SULFA* <=20 Sensitive      * For infections other than uncomplicated UTIcaused by E. coli, K. pneumoniae or P. mirabilis:Cefazolin is resistant if MIC > or = 8 mcg/mL.(Distinguishing susceptible versus intermediatefor isolates with MIC < or = 4 mcg/mL requiresadditional testing.)For uncomplicated UTI caused by E. coli,K. pneumoniae or P. mirabilis: Cefazolin issusceptible if MIC <32 mcg/mL and predictssusceptible to the oral agents cefaclor, cefdinir,cefpodoxime, cefprozil, cefuroxime, cephalexinand loracarbef.Legend:S = Susceptible  I = IntermediateR = Resistant  NS = Not susceptible* = Not tested  NR = Not reported**NN = See antimicrobic comments  Culture, blood (routine x 2)     Status: None (Preliminary result)   Collection Time: 02/27/19  8:10 PM  Result Value Ref Range Status   Specimen Description   Final    BLOOD BLOOD LEFT FOREARM Performed at [facility], [address] [place], [place] [address]    Special Requests   Final    BOTTLES  DRAWN AEROBIC AND ANAEROBIC Blood Culture adequate volume Performed at [facility], [address] [place], [place] [address]    Culture   Final    NO GROWTH 2 DAYS Performed at [facility], [place] [address] [place], [place] [address]    Report Status PENDING  Incomplete  Culture, blood (routine x 2)     Status: None (Preliminary result)   Collection Time: 02/27/19  9:19 PM  Result Value Ref Range Status   Specimen Description   Final    BLOOD LEFT ARM Performed at [facility], [address] [place], [place] [address]    Special Requests   Final    BOTTLES DRAWN AEROBIC AND ANAEROBIC Blood Culture adequate volume Performed at [facility], [address] [place], [place] [address]    Culture   Final    NO GROWTH < 24 HOURS Performed at [facility], [place] [address], [place], [place] [address]    Report Status PENDING  Incomplete    SIGNED:   Cordelia Poche, MD [facility] 03/01/2019, 12:38 PM

## 2019-03-02 LAB — URINE CULTURE: Culture: NO GROWTH

## 2019-03-03 NOTE — Telephone Encounter (Signed)
Diarrhea less Abdomen less tight Getting better she thinks  Advised restart Xifaxan Try to add 1/2 spironolactone back tomorrow and over time get back to reg med regimen as long as BP is around 587 systolic or more  She knows to contact me w/ ? Gave her my cell #

## 2019-03-04 LAB — CULTURE, BLOOD (ROUTINE X 2)
Culture: NO GROWTH
Special Requests: ADEQUATE

## 2019-03-05 LAB — CULTURE, BLOOD (ROUTINE X 2)
Culture: NO GROWTH
Special Requests: ADEQUATE

## 2019-03-06 ENCOUNTER — Telehealth: Payer: Self-pay

## 2019-03-06 ENCOUNTER — Telehealth: Payer: Self-pay | Admitting: Family Medicine

## 2019-03-06 DIAGNOSIS — K2100 Gastro-esophageal reflux disease with esophagitis, without bleeding: Secondary | ICD-10-CM

## 2019-03-06 NOTE — Telephone Encounter (Signed)
LM requesting call back to complete TCM and schedule hospital follow up.   

## 2019-03-06 NOTE — Telephone Encounter (Signed)
Copied from [place] [phone]. Topic: Quick Communication - Rx Refill/Question >> Mar 06, 2019  2:41 PM Mcneil, Ja-Kwan wrote: Medication: nadolol (CORGARD) 20 MG tablet, furosemide (LASIX) 40 MG tablet, spironolactone (ALDACTONE) 100 MG tablet, and pantoprazole (PROTONIX) 40 MG tablet  Has the patient contacted their pharmacy? yes   Preferred Pharmacy (with phone number or street name): [facility] [place] [place] [phone] (Phone)  [phone] (Fax)  Agent: Please be advised that RX refills may take up to 3 business days. We ask that you follow-up with your pharmacy.

## 2019-03-06 NOTE — Telephone Encounter (Signed)
Transition Care Management Follow-up Telephone Call  Admit date: 02/27/2019 Discharge date: 03/01/2019 Principal Problem:  Recurrent cystitis   How have you been since you were released from the hospital? "I'm feeling okay, weak and worn out, but much better"   Do you understand why you were in the hospital? yes   Do you understand the discharge instructions? yes   Where were you discharged to? Home. Resides with husband.    Items Reviewed:  Medications reviewed: yes  Allergies reviewed: yes  Dietary changes reviewed: yes  Referrals reviewed: yes   Functional Questionnaire:   Activities of Daily Living (ADLs):   She states they are independent in the following: ambulation, bathing and hygiene, feeding, continence, grooming, toileting and dressing States they require assistance with the following: None.    Any transportation issues/concerns?: no   Any patient concerns? No. Patient does have questions regarding recurrent condition. Referral to Uro vs continued dose of antx.    Confirmed importance and date/time of follow-up visits scheduled yes  Provider Appointment booked with Elyn Aquas, PA-C on 03/07/2019  Confirmed with patient if condition begins to worsen call PCP or go to the ER.  Patient was given the office number and encouraged to call back with question or concerns.  : yes

## 2019-03-07 ENCOUNTER — Inpatient Hospital Stay: Payer: 59 | Admitting: Physician Assistant

## 2019-03-09 ENCOUNTER — Other Ambulatory Visit: Payer: Self-pay | Admitting: Internal Medicine

## 2019-03-09 ENCOUNTER — Other Ambulatory Visit: Payer: Self-pay | Admitting: Family Medicine

## 2019-03-09 DIAGNOSIS — K2100 Gastro-esophageal reflux disease with esophagitis, without bleeding: Secondary | ICD-10-CM

## 2019-03-09 NOTE — Telephone Encounter (Signed)
Patient said that she will find her another PCP if these medications can not be filled today. She said she knows Dr Jonni Sanger is out of the office & she has asked twice if Dr Jonni Sanger would take these medications over for Dr Carlean Purl and was advised that she would. Patient is very upset and would like a call back from the nurse today

## 2019-03-09 NOTE — Telephone Encounter (Signed)
Called and spoke to [facility], they are to get all medications out of Dr. Marla Roe name and under Dr. Jonni Sanger since she is now PCP. I made changes medications and pt is aware and verbalized understanding.

## 2019-03-09 NOTE — Telephone Encounter (Signed)
Pharmacy called in and stated that pt told him that all scripts was suppose to transfer to Dr Jonni Sanger.  This meds were prescribe by Dr Delrae Rend Dr.    Please advise    [phone]- Pharmacy

## 2019-03-27 ENCOUNTER — Ambulatory Visit: Payer: 59 | Admitting: Family Medicine

## 2019-04-03 ENCOUNTER — Ambulatory Visit: Payer: Self-pay | Admitting: *Deleted

## 2019-04-03 NOTE — Telephone Encounter (Signed)
Pt reports "Ascites from my NASH diagnosis." States onset yesterday, worsening throughout day today. Abdominal pain 6/10, entire stomach "Feels tight." States mild SOB, reports O2 sats 98% on room air. Reports 2 episodes of vomiting today; took phenergan, effective. States has been staying hydrated and voiding WNL. Pt directed to ED, declines. Pt requesting "To have stomach scan and fluid drawn out at practice." States she left message with GI Dr. Carlean Purl, has not heard back. Assured [place] would alert Dr. Jonni Sanger of triage; reiterated need for ED visit, declines. States she will go if symptoms worsen over night.   Reason for Disposition . Patient sounds very sick or weak to the triager  Answer Assessment - Initial Assessment Questions 1. LOCATION: "Where does it hurt?"      Entire stomach 2. RADIATION: "Does the pain shoot anywhere else?" (e.g., chest, back)     no 3. ONSET: "When did the pain begin?" (e.g., minutes, hours or days ago)      Yesterday 4. SUDDEN: "Gradual or sudden onset?"     gradual 5. PATTERN "Does the pain come and go, or is it constant?"    - If constant: "Is it getting better, staying the same, or worsening?"      (Note: Constant means the pain never goes away completely; most serious pain is constant and it progresses)     - If intermittent: "How long does it last?" "Do you have pain now?"     (Note: Intermittent means the pain goes away completely between bouts)     constant 6. SEVERITY: "How bad is the pain?"  (e.g., Scale 1-10; mild, moderate, or severe)   - MILD (1-3): doesn't interfere with normal activities, abdomen soft and not tender to touch    - MODERATE (4-7): interferes with normal activities or awakens from sleep, tender to touch    - SEVERE (8-10): excruciating pain, doubled over, unable to do any normal activities      6/10 7. RECURRENT SYMPTOM: "Have you ever had this type of abdominal pain before?" If so, ask: "When was the last time?" and "What happened that  time?"      Yes NASH 8. CAUSE: "What do you think is causing the abdominal pain?"     Ascites 9. RELIEVING/AGGRAVATING FACTORS: "What makes it better or worse?" (e.g., movement, antacids, bowel movement)    no 10. OTHER SYMPTOMS: "Has there been any vomiting, diarrhea, constipation, or urine problems?"      Vomiting x 2  Protocols used: ABDOMINAL PAIN - [facility]

## 2019-04-04 NOTE — Telephone Encounter (Signed)
LM with details on pts VM

## 2019-04-04 NOTE — Telephone Encounter (Signed)
Please advise 

## 2019-04-04 NOTE — Telephone Encounter (Signed)
Pt needs to get in with GI, Dr. Carlean Purl.

## 2019-04-04 NOTE — Telephone Encounter (Signed)
See note

## 2019-04-04 NOTE — Telephone Encounter (Signed)
Called her Weight not up She is feeling better overall today  Has started Septra for recurrent UTI sxs and thinks some nausea and vomiting from UTI caused abd sxs she described in My Chart  Again - much better No testing needed Observe and f/u prn

## 2019-04-07 ENCOUNTER — Other Ambulatory Visit: Payer: Self-pay | Admitting: Family Medicine

## 2019-04-07 ENCOUNTER — Encounter: Payer: Self-pay | Admitting: *Deleted

## 2019-04-07 DIAGNOSIS — K2100 Gastro-esophageal reflux disease with esophagitis, without bleeding: Secondary | ICD-10-CM

## 2019-04-10 ENCOUNTER — Encounter: Payer: Self-pay | Admitting: Family Medicine

## 2019-04-10 ENCOUNTER — Ambulatory Visit (INDEPENDENT_AMBULATORY_CARE_PROVIDER_SITE_OTHER): Payer: 59 | Admitting: Family Medicine

## 2019-04-10 ENCOUNTER — Other Ambulatory Visit: Payer: Self-pay

## 2019-04-10 VITALS — BP 110/60 | Temp 97.2°F | Wt 198.0 lb

## 2019-04-10 DIAGNOSIS — I10 Essential (primary) hypertension: Secondary | ICD-10-CM | POA: Diagnosis not present

## 2019-04-10 DIAGNOSIS — K2100 Gastro-esophageal reflux disease with esophagitis, without bleeding: Secondary | ICD-10-CM

## 2019-04-10 DIAGNOSIS — G4709 Other insomnia: Secondary | ICD-10-CM | POA: Insufficient documentation

## 2019-04-10 DIAGNOSIS — K7460 Unspecified cirrhosis of liver: Secondary | ICD-10-CM

## 2019-04-10 DIAGNOSIS — K7581 Nonalcoholic steatohepatitis (NASH): Secondary | ICD-10-CM

## 2019-04-10 DIAGNOSIS — R300 Dysuria: Secondary | ICD-10-CM

## 2019-04-10 DIAGNOSIS — G4700 Insomnia, unspecified: Secondary | ICD-10-CM

## 2019-04-10 DIAGNOSIS — K219 Gastro-esophageal reflux disease without esophagitis: Secondary | ICD-10-CM | POA: Diagnosis not present

## 2019-04-10 MED ORDER — CEPHALEXIN 500 MG PO CAPS: 500.0000 mg | ORAL_CAPSULE | Freq: Two times a day (BID) | ORAL | 0 refills | Status: DC

## 2019-04-10 MED ORDER — HYDROXYZINE HCL 25 MG PO TABS: 25.0000 mg | ORAL_TABLET | Freq: Three times a day (TID) | ORAL | 0 refills | Status: DC | PRN

## 2019-04-10 MED ORDER — PANTOPRAZOLE SODIUM 40 MG PO TBEC: 40.0000 mg | DELAYED_RELEASE_TABLET | Freq: Every day | ORAL | 11 refills | Status: DC

## 2019-04-10 MED ORDER — LACTULOSE 10 GM/15ML PO SOLN: 10.0000 g | Freq: Two times a day (BID) | ORAL | Status: DC | PRN

## 2019-04-10 MED ORDER — BUTALBITAL-APAP-CAFFEINE 50-300-40 MG PO CAPS: 1.0000 | ORAL_CAPSULE | Freq: Three times a day (TID) | ORAL | Status: DC | PRN

## 2019-04-10 NOTE — Progress Notes (Signed)
Virtual Visit via Video Note  Subjective  CC:  Chief Complaint  Patient presents with  . Gastroesophageal Reflux  . Cirrhosis  . Hypertension  . Urinary Tract Infection     I connected with Dawn Moon on 04/10/19 at  1:20 PM EDT by a video enabled telemedicine application and verified that I am speaking with the correct person using two identifiers. Location patient: Home Location provider: [facility] [facility], Office Persons participating in the virtual visit: Dawn Moon, Leamon Arnt, MD Lilli Light, [place] discussed the limitations of evaluation and management by telemedicine and the availability of in person appointments. The patient expressed understanding and agreed to proceed. HPI: Dawn Moon is a 63 y.o. female who was contacted today to address the problems listed above in the chief complaint. Marland Kitchen GERD: doing well on PPI; has GI - needs refill.  . Cirrhosis: stable. Reviewed feb [facility] transplant visit. Also reviewed labs from 02/2019. Liver is stable w/o GI bleeding, ascites, LE edema. Weight is stable as is BP. On diuretics and low sodium diet. No mental status changes.  . H/o UTI requiring IV abx due to n/v. No pyelo. Did have mild AKI with creatinine up to 1.5; hasn't has repeat since covid 19 restrictions. Now w/ 24 hour h/o dysuria w/o any other sxs. Had klebsiella UTI x 2 urine cultures sensitive to everything but amox in Feb/march. No f/c/s. Prefers to NOT come to office: due to covid - 19 and lives in [place]. No h/o prior recurrent UTIs or pyelo in past. No constipation.  Marland Kitchen HTN is doing very well. Checks routinely at home. Normal readings consistently. No cp or palpitations; does get sob easily but sedentary.  . Insomnia - much better. On trazadone.  . I reviewed recent records regarding hospitalization for renal infection in march and labs / transplant/liver appt from 01/2019. Renal and potassium function had normalized. Liver tests were  improved.  Assessment  1. Liver cirrhosis secondary to NASH ([place])   2. Essential hypertension   3. Gastroesophageal reflux disease without esophagitis   4. Secondary insomnia   5. Dysuria   6. Gastroesophageal reflux disease with esophagitis      Plan   GERD:  Refilled PPI  Cirrhosis due to NASH: stable on current meds. Hep A vaccinations complete. Continue current plans. Hydroxyzine prn for itching refilled  HTN is well controlled.   Dysuria: presumed early UTI. Discussed warning signs. Will treat empirically with abx. To follow sxs. Would need OV if worsens. Patient understands and agrees with care plan.   Insomnia is now controlled.   HM: up to date. Due for cpe in sept.  I discussed the assessment and treatment plan with the patient. The patient was provided an opportunity to ask questions and all were answered. The patient agreed with the plan and demonstrated an understanding of the instructions.   The patient was advised to call back or seek an in-person evaluation if the symptoms worsen or if the condition fails to improve as anticipated. Follow up: Return in about 5 months (around 09/10/2019) for complete physical, follow up Hypertension.  Visit date not found  Meds ordered this encounter  Medications  . cephALEXin (KEFLEX) 500 MG capsule    Sig: Take 1 capsule (500 mg total) by mouth 2 (two) times daily.    Dispense:  14 capsule    Refill:  0  . hydrOXYzine (ATARAX/VISTARIL) 25 MG tablet    Sig: Take  1 tablet (25 mg total) by mouth 3 (three) times daily as needed for anxiety or itching.    Dispense:  90 tablet    Refill:  0  . pantoprazole (PROTONIX) 40 MG tablet    Sig: Take 1 tablet (40 mg total) by mouth daily.    Dispense:  30 tablet    Refill:  11  . lactulose (CHRONULAC) 10 GM/15ML solution    Sig: Take 15 mLs (10 g total) by mouth 2 (two) times daily as needed for mild constipation or moderate constipation.  . Butalbital-APAP-Caffeine 50-300-40 MG CAPS     Sig: Take 1 tablet by mouth every 8 (eight) hours as needed (migraine).      I reviewed the patients updated PMH, FH, and SocHx.    Patient Active Problem List   Diagnosis Date Noted  . Secondary insomnia 04/10/2019  . Lightheadedness 02/28/2019  . Dehydration 02/28/2019  . Anemia 02/28/2019  . Thrombocytopenia ([place]) 02/28/2019  . Degeneration of lumbar intervertebral disc 08/06/2018  . Secondary esophageal varices without bleeding ([place]) 06/03/2018  . Encephalopathy, hepatic ([place]) 05/13/2018  . History of paroxysmal supraventricular tachycardia   . Liver cirrhosis secondary to NASH ([place]) 04/23/2018  . Irritable bowel syndrome-diarrhea predominant 06/15/2017  . Gastroesophageal reflux disease 06/15/2017  . Hyperlipidemia   . Kidney stone   . Essential hypertension 02/13/2010   Current Meds  Medication Sig  . Butalbital-APAP-Caffeine 50-300-40 MG CAPS Take 1 tablet by mouth every 8 (eight) hours as needed (migraine).  . Eluxadoline (VIBERZI) 75 MG TABS Take 75 mg by mouth 2 (two) times daily.  . furosemide (LASIX) 40 MG tablet TAKE 1 TABLET DAILY (Patient taking differently: Take 40 mg by mouth daily. )  . hydrOXYzine (ATARAX/VISTARIL) 25 MG tablet Take 1 tablet (25 mg total) by mouth 3 (three) times daily as needed for anxiety or itching.  . lactulose (CHRONULAC) 10 GM/15ML solution Take 15 mLs (10 g total) by mouth 2 (two) times daily as needed for mild constipation or moderate constipation.  . nadolol (CORGARD) 20 MG tablet TAKE 1/2 TO 1 TABLET ONCE DAILY AS DIRECTED (Patient taking differently: Take 10 mg by mouth daily. )  . pantoprazole (PROTONIX) 40 MG tablet Take 1 tablet (40 mg total) by mouth daily.  . promethazine (PHENERGAN) 25 MG tablet Take 1 tablet (25 mg total) by mouth every 6 (six) hours as needed for nausea or vomiting.  . rifaximin (XIFAXAN) 550 MG TABS tablet Take 1 tablet (550 mg total) by mouth 2 (two) times daily.  Marland Kitchen spironolactone (ALDACTONE) 100 MG tablet  TAKE 1 TABLET DAILY (Patient taking differently: Take 100 mg by mouth daily after breakfast. )  . traZODone (DESYREL) 50 MG tablet Take 0.5-1 tablets (25-50 mg total) by mouth at bedtime as needed for sleep.  . [DISCONTINUED] Butalbital-APAP-Caffeine 50-300-40 MG CAPS Take 1 tablet by mouth every 8 (eight) hours as needed. (Patient taking differently: Take 1 tablet by mouth every 8 (eight) hours as needed (migraine). )  . [DISCONTINUED] hydrOXYzine (ATARAX/VISTARIL) 25 MG tablet Take 1 tablet (25 mg total) by mouth 3 (three) times daily as needed for anxiety.  . [DISCONTINUED] lactulose (CHRONULAC) 10 GM/15ML solution Take 15 mLs (10 g total) by mouth 2 (two) times daily. (Patient taking differently: Take 10 g by mouth 2 (two) times daily as needed for mild constipation or moderate constipation. )  . [DISCONTINUED] pantoprazole (PROTONIX) 40 MG tablet Take 1 tablet (40 mg total) by mouth daily.    Allergies: Patient has  No Known Allergies. Family History: Patient family history includes Arthritis in her sister; Celiac disease in her daughter; Coronary artery disease in her father and mother; Diabetes in her father; Healthy in her brother; Hypertension in her sister; IgA nephropathy in her daughter; Lymphoma in her mother. Social History:  Patient  reports that she has never smoked. She has never used smokeless tobacco. She reports previous alcohol use. She reports that she does not use drugs.  Review of Systems: Constitutional: Negative for fever malaise or anorexia Cardiovascular: negative for chest pain Respiratory: negative for SOB or persistent cough Gastrointestinal: negative for abdominal pain  OBJECTIVE Vitals: BP 110/60   Temp (!) 97.2 F (36.2 C) (Oral)   Wt 198 lb (89.8 kg)   BMI 35.07 kg/m  General: no acute distress , A&Ox3 She appears well, in no apparent distress.  Alert and oriented times three, pleasant and cooperative. Vital signs are as documented in vital signs  section.  No visits with results within 1 Day(s) from this visit.  Latest known visit with results is:  Admission on 02/27/2019, Discharged on 03/01/2019  Component Date Value Ref Range Status  . Color, Urine 02/27/2019 ORANGE* YELLOW Final  . APPearance 02/27/2019 CLOUDY* CLEAR Final  . Specific Gravity, Urine 02/27/2019 >1.030* 1.005 - 1.030 Final  . pH 02/27/2019 5.0  5.0 - 8.0 Final  . Glucose, UA 02/27/2019 100* NEGATIVE mg/dL Final  . Hgb urine dipstick 02/27/2019 TRACE* NEGATIVE Final  . Bilirubin Urine 02/27/2019 MODERATE* NEGATIVE Final  . Ketones, ur 02/27/2019 15* NEGATIVE mg/dL Final  . Protein, ur 02/27/2019 100* NEGATIVE mg/dL Final  . Nitrite 02/27/2019 POSITIVE* NEGATIVE Final  . Leukocytes,Ua 02/27/2019 MODERATE* NEGATIVE Final  . RBC / HPF 02/27/2019 0-5  0 - 5 RBC/hpf Final  . WBC, UA 02/27/2019 >50  0 - 5 WBC/hpf Final  . Bacteria, UA 02/27/2019 MANY* NONE SEEN Final  . Squamous Epithelial / LPF 02/27/2019 6-10  0 - 5 Final  . Lactic Acid, Venous 02/27/2019 2.2* 0.5 - 1.9 mmol/L Final  . Lactic Acid, Venous 02/27/2019 2.1* 0.5 - 1.9 mmol/L Final  . Specimen Description 02/27/2019    Final                   Value:BLOOD BLOOD LEFT FOREARM Performed at [facility], [address] [place], [place] [address]   . Special Requests 02/27/2019    Final                   Value:BOTTLES DRAWN AEROBIC AND ANAEROBIC Blood Culture adequate volume Performed at [facility], [address] [place], [place] [address]   . Culture 02/27/2019    Final                   Value:NO GROWTH 5 DAYS Performed at [facility], [place] [address], [place], [place] [address]   . Report Status 02/27/2019 03/04/2019 FINAL   Final  . Specimen Description 02/27/2019    Final                   Value:BLOOD LEFT ARM Performed at [facility], [address] [place],  [address]   . Special Requests 02/27/2019    Final                   Value:BOTTLES  DRAWN AEROBIC AND ANAEROBIC Blood Culture adequate volume Performed at [facility],  [address], [place], [place] [address]   . Culture 02/27/2019    Final                   Value:NO GROWTH 5 DAYS Performed at [facility], [place] [address] [place], [place] [address]   . Report Status 02/27/2019 03/05/2019 FINAL   Final  . WBC 02/27/2019 6.1  4.0 - 10.5 K/uL Final  . RBC 02/27/2019 3.92  3.87 - 5.11 MIL/uL Final  . Hemoglobin 02/27/2019 11.8* 12.0 - 15.0 g/dL Final  . HCT 02/27/2019 36.6  36.0 - 46.0 % Final  . MCV 02/27/2019 93.4  80.0 - 100.0 fL Final  . MCH 02/27/2019 30.1  26.0 - 34.0 pg Final  . MCHC 02/27/2019 32.2  30.0 - 36.0 g/dL Final  . RDW 02/27/2019 14.5  11.5 - 15.5 % Final  . Platelets 02/27/2019 94* 150 - 400 K/uL Final  . nRBC 02/27/2019 0.0  0.0 - 0.2 % Final  . Neutrophils Relative % 02/27/2019 93  % Final  . Neutro Abs 02/27/2019 5.5  1.7 - 7.7 K/uL Final  . Lymphocytes Relative 02/27/2019 4  % Final  . Lymphs Abs 02/27/2019 0.2* 0.7 - 4.0 K/uL Final  . Monocytes Relative 02/27/2019 1  % Final  . Monocytes Absolute 02/27/2019 0.1  0.1 - 1.0 K/uL Final  . Eosinophils Relative 02/27/2019 0  % Final  . Eosinophils Absolute 02/27/2019 0.0  0.0 - 0.5 K/uL Final  . Basophils Relative 02/27/2019 1  % Final  . Basophils Absolute 02/27/2019 0.0  0.0 - 0.1 K/uL Final  . WBC Morphology 02/27/2019 INCREASED BANDS (>20% BANDS)   Final  . Smear Review 02/27/2019 Normal platelet morphology   Final  . Immature Granulocytes 02/27/2019 1  % Final  . Abs Immature Granulocytes 02/27/2019 0.05  0.00 - 0.07 K/uL Final  . Tear Drop Cells 02/27/2019 PRESENT   Final  . Spherocytes 02/27/2019 PRESENT   Final  . Sodium 02/27/2019 135  135 - 145 mmol/L Final  . Potassium 02/27/2019 3.5  3.5 - 5.1 mmol/L Final  . Chloride 02/27/2019 108  98 - 111 mmol/L Final  . CO2 02/27/2019 20* 22 - 32 mmol/L Final  . Glucose, Bld 02/27/2019 87  70 - 99 mg/dL Final  . BUN  02/27/2019 15  8 - 23 mg/dL Final  . Creatinine, Ser 02/27/2019 1.58* 0.44 - 1.00 mg/dL Final  . Calcium 02/27/2019 8.1* 8.9 - 10.3 mg/dL Final  . Total Protein 02/27/2019 6.3* 6.5 - 8.1 g/dL Final  . Albumin 02/27/2019 2.9* 3.5 - 5.0 g/dL Final  . AST 02/27/2019 43* 15 - 41 U/L Final  . ALT 02/27/2019 20  0 - 44 U/L Final  . Alkaline Phosphatase 02/27/2019 86  38 - 126 U/L Final  . Total Bilirubin 02/27/2019 1.7* 0.3 - 1.2 mg/dL Final  . GFR calc non Af Amer 02/27/2019 35* >60 mL/min Final  . GFR calc Af Amer 02/27/2019 40* >60 mL/min Final  . Anion gap 02/27/2019 7  5 - 15 Final  . Prothrombin Time 02/27/2019 17.3* 11.4 - 15.2 seconds Final  . INR 02/27/2019 1.4* 0.8 - 1.2 Final  . WBC 02/28/2019 7.1  4.0 - 10.5 K/uL Final  . RBC 02/28/2019 3.65* 3.87 - 5.11 MIL/uL Final  . Hemoglobin 02/28/2019 11.1* 12.0 - 15.0 g/dL Final  . HCT 02/28/2019 34.5* 36.0 - 46.0 % Final  . MCV 02/28/2019 94.5  80.0 - 100.0 fL Final  .  MCH 02/28/2019 30.4  26.0 - 34.0 pg Final  . MCHC 02/28/2019 32.2  30.0 - 36.0 g/dL Final  . RDW 02/28/2019 14.5  11.5 - 15.5 % Final  . Platelets 02/28/2019 78* 150 - 400 K/uL Final  . nRBC 02/28/2019 0.0  0.0 - 0.2 % Final  . Iron 02/28/2019 65  28 - 170 ug/dL Final  . TIBC 02/28/2019 328  250 - 450 ug/dL Final  . Saturation Ratios 02/28/2019 20  10.4 - 31.8 % Final  . UIBC 02/28/2019 263  ug/dL Final  . Ferritin 02/28/2019 47  11 - 307 ng/mL Final  . Lactic Acid, Venous 02/28/2019 1.8  0.5 - 1.9 mmol/L Final  . Specimen Description 02/28/2019    Final                   Value:URINE, RANDOM Performed at [facility], [place] [address], [place], [place] [address]   . Special Requests 02/28/2019    Final                   Value:NONE Performed at [facility], [place] [address], [place], [place] [address]   . Culture 02/28/2019    Final                   Value:NO GROWTH Performed at [facility], [place] [address],  [place], [place] [address]   . Report Status 02/28/2019 03/02/2019 FINAL   Final  . Sodium 02/28/2019 138  135 - 145 mmol/L Final  . Potassium 02/28/2019 4.1  3.5 - 5.1 mmol/L Final  . Chloride 02/28/2019 112* 98 - 111 mmol/L Final  . CO2 02/28/2019 19* 22 - 32 mmol/L Final  . Glucose, Bld 02/28/2019 134* 70 - 99 mg/dL Final  . BUN 02/28/2019 17  8 - 23 mg/dL Final  . Creatinine, Ser 02/28/2019 1.40* 0.44 - 1.00 mg/dL Final  . Calcium 02/28/2019 8.0* 8.9 - 10.3 mg/dL Final  . GFR calc non Af Amer 02/28/2019 40* >60 mL/min Final  . GFR calc Af Amer 02/28/2019 47* >60 mL/min Final  . Anion gap 02/28/2019 7  5 - 15 Final  . Prothrombin Time 02/28/2019 18.1* 11.4 - 15.2 seconds Final  . INR 02/28/2019 1.5* 0.8 - 1.2 Final  . Total Protein 02/28/2019 6.0* 6.5 - 8.1 g/dL Final  . Albumin 02/28/2019 2.7* 3.5 - 5.0 g/dL Final  . AST 02/28/2019 45* 15 - 41 U/L Final  . ALT 02/28/2019 19  0 - 44 U/L Final  . Alkaline Phosphatase 02/28/2019 54  38 - 126 U/L Final  . Total Bilirubin 02/28/2019 1.6* 0.3 - 1.2 mg/dL Final  . Bilirubin, Direct 02/28/2019 0.5* 0.0 - 0.2 mg/dL Final  . Indirect Bilirubin 02/28/2019 1.1* 0.3 - 0.9 mg/dL Final  . Sodium 03/01/2019 136  135 - 145 mmol/L Final  . Potassium 03/01/2019 3.5  3.5 - 5.1 mmol/L Final  . Chloride 03/01/2019 112* 98 - 111 mmol/L Final  . CO2 03/01/2019 20* 22 - 32 mmol/L Final  . Glucose, Bld 03/01/2019 111* 70 - 99 mg/dL Final  . BUN 03/01/2019 20  8 - 23 mg/dL Final  . Creatinine, Ser 03/01/2019 1.30* 0.44 - 1.00 mg/dL Final  . Calcium 03/01/2019 8.1* 8.9 - 10.3 mg/dL Final  . Total Protein 03/01/2019 5.8* 6.5 - 8.1 g/dL Final  . Albumin 03/01/2019 2.5* 3.5 - 5.0 g/dL Final  . AST 03/01/2019 32  15 - 41 U/L Final  .  ALT 03/01/2019 17  0 - 44 U/L Final  . Alkaline Phosphatase 03/01/2019 57  38 - 126 U/L Final  . Total Bilirubin 03/01/2019 1.2  0.3 - 1.2 mg/dL Final  . GFR calc non Af Amer 03/01/2019 44* >60 mL/min Final  . GFR calc Af Amer  03/01/2019 51* >60 mL/min Final  . Anion gap 03/01/2019 4* 5 - 15 Final  . Prothrombin Time 03/01/2019 15.5* 11.4 - 15.2 seconds Final  . INR 03/01/2019 1.2  0.8 - 1.2 Final  . WBC 03/01/2019 5.2  4.0 - 10.5 K/uL Final  . RBC 03/01/2019 3.54* 3.87 - 5.11 MIL/uL Final  . Hemoglobin 03/01/2019 10.9* 12.0 - 15.0 g/dL Final  . HCT 03/01/2019 33.6* 36.0 - 46.0 % Final  . MCV 03/01/2019 94.9  80.0 - 100.0 fL Final  . MCH 03/01/2019 30.8  26.0 - 34.0 pg Final  . MCHC 03/01/2019 32.4  30.0 - 36.0 g/dL Final  . RDW 03/01/2019 15.0  11.5 - 15.5 % Final  . Platelets 03/01/2019 78* 150 - 400 K/uL Final  . nRBC 03/01/2019 0.0  0.0 - 0.2 % Final     Leamon Arnt, MD

## 2019-04-10 NOTE — Patient Instructions (Signed)
Please return in September 2020 for your annual complete physical; please come fasting.   If you have any questions or concerns, please don't hesitate to send me a message via MyChart or call the office at [phone]. Thank you for visiting with [place] today! It's our pleasure caring for you.

## 2019-04-25 ENCOUNTER — Encounter: Payer: Self-pay | Admitting: Family Medicine

## 2019-04-26 ENCOUNTER — Other Ambulatory Visit: Payer: Self-pay

## 2019-04-26 ENCOUNTER — Ambulatory Visit (INDEPENDENT_AMBULATORY_CARE_PROVIDER_SITE_OTHER): Payer: 59 | Admitting: Family Medicine

## 2019-04-26 ENCOUNTER — Other Ambulatory Visit (INDEPENDENT_AMBULATORY_CARE_PROVIDER_SITE_OTHER): Payer: 59

## 2019-04-26 ENCOUNTER — Encounter: Payer: Self-pay | Admitting: Family Medicine

## 2019-04-26 VITALS — Wt 192.0 lb

## 2019-04-26 DIAGNOSIS — N390 Urinary tract infection, site not specified: Secondary | ICD-10-CM

## 2019-04-26 LAB — URINALYSIS, ROUTINE W REFLEX MICROSCOPIC
Bilirubin Urine: NEGATIVE
Ketones, ur: NEGATIVE
Nitrite: NEGATIVE
Specific Gravity, Urine: 1.02 (ref 1.000–1.030)
Total Protein, Urine: NEGATIVE
Urine Glucose: NEGATIVE
Urobilinogen, UA: 1 (ref 0.0–1.0)
pH: 7.5 (ref 5.0–8.0)

## 2019-04-26 MED ORDER — CIPROFLOXACIN HCL 500 MG PO TABS: 500.0000 mg | ORAL_TABLET | Freq: Two times a day (BID) | ORAL | 0 refills | Status: AC

## 2019-04-26 MED ORDER — FLUCONAZOLE 150 MG PO TABS: ORAL_TABLET | ORAL | 0 refills | Status: DC

## 2019-04-26 NOTE — Progress Notes (Signed)
Virtual Visit via Video Note  Subjective  CC:  Chief Complaint  Patient presents with  . Urinary Tract Infection    Symptoms started yesterday. C/o nausea and vomiting, buring with urination, and frequency     I connected with [name] on 04/26/19 at  8:20 AM EDT by a video enabled telemedicine application and verified that I am speaking with the correct person using two identifiers. Location patient: Home Location provider: [facility] [place], Office Persons participating in the virtual visit: Dawn Moon, Leamon Arnt, MD Lilli Light, [place] discussed the limitations of evaluation and management by telemedicine and the availability of in person appointments. The patient expressed understanding and agreed to proceed. HPI: Dawn Moon is a 63 y.o. female who was contacted today to address the problems listed above in the chief complaint. . C/o UTI sxs as noted above. Has h/o urosepsis in march 2020 due to klebsiella UTI. Then had another UTI in April treated empirically due to covid-19 restrictions. Now with dysuria, frequency, nausea/vomiting and malaise. No fever or flank pain. Also w/o some vaginal itching. No pelvic pain. Has NASH cirrhosis and that is stable by her report: weight is stable, no edema/ascites, normal breathing. This is her 4th UTI this year.   Assessment  1. Recurrent UTI      Plan   Recurrent UTI:  Need urine culture. Pt will come in to office to leave a sample. Start cipro 500 bid, diflucan empirically, and refer to urology given recurrent infections w/o h/o urosepsis. Discussed that sxs should improve over next 48 hours; f/u with me if they are not improving. To ER if fever, n/v or abdominal pain develop.  I discussed the assessment and treatment plan with the patient. The patient was provided an opportunity to ask questions and all were answered. The patient agreed with the plan and demonstrated an understanding  of the instructions.   The patient was advised to call back or seek an in-person evaluation if the symptoms worsen or if the condition fails to improve as anticipated. Follow up: Return in about 4 months (around 08/27/2019) for complete physical.  Visit date not found  Meds ordered this encounter  Medications  . ciprofloxacin (CIPRO) 500 MG tablet    Sig: Take 1 tablet (500 mg total) by mouth 2 (two) times daily for 7 days.    Dispense:  14 tablet    Refill:  0  . fluconazole (DIFLUCAN) 150 MG tablet    Sig: Take one tablet today; may repeat in 3 days if symptoms persist    Dispense:  2 tablet    Refill:  0      I reviewed the patients updated PMH, FH, and SocHx.    Patient Active Problem List   Diagnosis Date Noted  . Secondary insomnia 04/10/2019  . Lightheadedness 02/28/2019  . Dehydration 02/28/2019  . Anemia 02/28/2019  . Thrombocytopenia ([place]) 02/28/2019  . Degeneration of lumbar intervertebral disc 08/06/2018  . Secondary esophageal varices without bleeding ([place]) 06/03/2018  . Encephalopathy, hepatic ([place]) 05/13/2018  . History of paroxysmal supraventricular tachycardia   . Liver cirrhosis secondary to NASH ([place]) 04/23/2018  . Irritable bowel syndrome-diarrhea predominant 06/15/2017  . Gastroesophageal reflux disease 06/15/2017  . Hyperlipidemia   . Kidney stone   . Essential hypertension 02/13/2010   Current Meds  Medication Sig  . Butalbital-APAP-Caffeine 50-300-40 MG CAPS Take 1 tablet by mouth every 8 (eight) hours  as needed (migraine).  . Eluxadoline (VIBERZI) 75 MG TABS Take 75 mg by mouth 2 (two) times daily.  . furosemide (LASIX) 40 MG tablet TAKE 1 TABLET DAILY (Patient taking differently: Take 40 mg by mouth daily. )  . hydrOXYzine (ATARAX/VISTARIL) 25 MG tablet Take 1 tablet (25 mg total) by mouth 3 (three) times daily as needed for anxiety or itching.  . lactulose (CHRONULAC) 10 GM/15ML solution Take 15 mLs (10 g total) by mouth 2 (two) times daily as  needed for mild constipation or moderate constipation.  . nadolol (CORGARD) 20 MG tablet TAKE 1/2 TO 1 TABLET ONCE DAILY AS DIRECTED (Patient taking differently: Take 10 mg by mouth daily. )  . pantoprazole (PROTONIX) 40 MG tablet Take 1 tablet (40 mg total) by mouth daily.  . promethazine (PHENERGAN) 25 MG tablet Take 1 tablet (25 mg total) by mouth every 6 (six) hours as needed for nausea or vomiting.  . rifaximin (XIFAXAN) 550 MG TABS tablet Take 1 tablet (550 mg total) by mouth 2 (two) times daily.  Marland Kitchen spironolactone (ALDACTONE) 100 MG tablet TAKE 1 TABLET DAILY (Patient taking differently: Take 100 mg by mouth daily after breakfast. )  . traZODone (DESYREL) 50 MG tablet Take 0.5-1 tablets (25-50 mg total) by mouth at bedtime as needed for sleep.    Allergies: Patient has No Known Allergies. Family History: Patient family history includes Arthritis in her sister; Celiac disease in her daughter; Coronary artery disease in her father and mother; Diabetes in her father; Healthy in her brother; Hypertension in her sister; IgA nephropathy in her daughter; Lymphoma in her mother. Social History:  Patient  reports that she has never smoked. She has never used smokeless tobacco. She reports previous alcohol use. She reports that she does not use drugs.  Review of Systems: Constitutional: Negative for fever malaise or anorexia Cardiovascular: negative for chest pain Respiratory: negative for SOB or persistent cough Gastrointestinal: negative for abdominal pain  OBJECTIVE Vitals: Wt 192 lb (87.1 kg)   BMI 34.01 kg/m  General: no acute distress , A&Ox3, appears tired  Leamon Arnt, MD

## 2019-04-27 LAB — URINE CULTURE
MICRO NUMBER:: 450238
SPECIMEN QUALITY:: ADEQUATE

## 2019-04-28 NOTE — Progress Notes (Signed)
Please call patient: I have reviewed his/her lab results. Her urine culture came back negative, which is surprising. Please complete 5 days of the antibiotics then stop. Let me know if sxs persist.

## 2019-05-08 ENCOUNTER — Other Ambulatory Visit: Payer: Self-pay | Admitting: Family Medicine

## 2019-05-12 ENCOUNTER — Encounter: Payer: Self-pay | Admitting: Internal Medicine

## 2019-05-12 ENCOUNTER — Encounter: Payer: Self-pay | Admitting: Family Medicine

## 2019-05-12 NOTE — Telephone Encounter (Signed)
Just double checking, but did you send notes with the referral or can they see our notes?

## 2019-06-05 ENCOUNTER — Other Ambulatory Visit: Payer: Self-pay | Admitting: Family Medicine

## 2019-06-12 ENCOUNTER — Encounter: Payer: Self-pay | Admitting: Physical Therapy

## 2019-06-12 DIAGNOSIS — N390 Urinary tract infection, site not specified: Secondary | ICD-10-CM

## 2019-06-12 HISTORY — DX: Urinary tract infection, site not specified: N39.0

## 2019-06-21 ENCOUNTER — Encounter: Payer: Self-pay | Admitting: Family Medicine

## 2019-06-21 ENCOUNTER — Ambulatory Visit (INDEPENDENT_AMBULATORY_CARE_PROVIDER_SITE_OTHER): Payer: 59 | Admitting: Family Medicine

## 2019-06-21 ENCOUNTER — Other Ambulatory Visit: Payer: Self-pay

## 2019-06-21 ENCOUNTER — Other Ambulatory Visit (HOSPITAL_COMMUNITY)
Admission: RE | Admit: 2019-06-21 | Discharge: 2019-06-21 | Disposition: A | Discharge status: Home / Self Care | Payer: 59 | Source: Ambulatory Visit | Attending: Family Medicine | Admitting: Family Medicine

## 2019-06-21 VITALS — BP 118/70 | HR 73 | Temp 98.0°F | Resp 16 | Ht 63.0 in | Wt 200.0 lb

## 2019-06-21 DIAGNOSIS — N760 Acute vaginitis: Secondary | ICD-10-CM | POA: Diagnosis not present

## 2019-06-21 MED ORDER — TRIAMCINOLONE ACETONIDE 0.1 % EX CREA: 1.0000 "application " | TOPICAL_CREAM | Freq: Two times a day (BID) | CUTANEOUS | 0 refills | Status: DC

## 2019-06-21 NOTE — Patient Instructions (Addendum)
Please return in September for your annual complete physical; please come fasting.  Let me see if there is yeast present from your swab; if there is I will send in medication.  Try to keep your undergarments dry. Try Desitin or Baby diaper creams to the area after treating for about 5 days with the steroid to calm down the inflammation.   If you have any questions or concerns, please don't hesitate to send me a message via MyChart or call the office at [phone]. Thank you for visiting with [place] today! It's our pleasure caring for you.

## 2019-06-21 NOTE — Progress Notes (Signed)
Subjective  CC:  Chief Complaint  Patient presents with  . Vaginal Itching    Gotten worse about 2 weeks ago, has tried vagisil and Ph balance soap with minimal relief    HPI: Dawn Moon is a 63 y.o. female who presents to the office today to address the problems listed above in the chief complaint. Patient presents for evaluation of an abnormal vaginal itching: she describes  thin without watery discharge and urinary incontinence (not sure if actually is urine or dishcarge). Sexually transmitted infection risk: Very low risk of STD exposure. . She declines serology testing and HIV testing today. She has recurrent UTI's recently evaluated by urology. No dysuria. She denies rash. I treated her with diflucan in may while treating a uti with abx. She reports itching improved but now is back.  She has not had any further antiyeast medications since. She is using vagisil with mild short term relief.   I reviewed the patients updated PMH, FH, and SocHx.    Patient Active Problem List   Diagnosis Date Noted  . Urinary frequency 06/12/2019  . Secondary insomnia 04/10/2019  . Lightheadedness 02/28/2019  . Anemia 02/28/2019  . Thrombocytopenia ([place]) 02/28/2019  . Degeneration of lumbar intervertebral disc 08/06/2018  . Secondary esophageal varices without bleeding ([place]) 06/03/2018  . Encephalopathy, hepatic ([place]) 05/13/2018  . History of paroxysmal supraventricular tachycardia   . Liver cirrhosis secondary to NASH ([place]) 04/23/2018  . Irritable bowel syndrome-diarrhea predominant 06/15/2017  . Gastroesophageal reflux disease 06/15/2017  . Hyperlipidemia   . Kidney stone   . Essential hypertension 02/13/2010   Current Meds  Medication Sig  . Butalbital-APAP-Caffeine 50-300-40 MG CAPS Take 1 tablet by mouth every 8 (eight) hours as needed (migraine).  . Eluxadoline (VIBERZI) 75 MG TABS Take 75 mg by mouth 2 (two) times daily.  . furosemide (LASIX) 40 MG tablet TAKE 1 TABLET DAILY   . hydrOXYzine (ATARAX/VISTARIL) 25 MG tablet TAKE 1 TABLET BY MOUTH 3 TIMES DAILY AS NEEDED FOR ANXIETY OR ITCHING.  . lactulose (CHRONULAC) 10 GM/15ML solution Take 15 mLs (10 g total) by mouth 2 (two) times daily as needed for mild constipation or moderate constipation.  . nadolol (CORGARD) 20 MG tablet TAKE 1/2 TO 1 TABLET ONCE DAILY AS DIRECTED  . pantoprazole (PROTONIX) 40 MG tablet Take 1 tablet (40 mg total) by mouth daily.  . promethazine (PHENERGAN) 25 MG tablet Take 1 tablet (25 mg total) by mouth every 6 (six) hours as needed for nausea or vomiting.  . rifaximin (XIFAXAN) 550 MG TABS tablet Take 1 tablet (550 mg total) by mouth 2 (two) times daily.  Marland Kitchen spironolactone (ALDACTONE) 100 MG tablet TAKE 1 TABLET DAILY  . traZODone (DESYREL) 50 MG tablet TAKE 1/2 TO 1 TABLET AT BEDTIME AS NEEDED FOR SLEEP    Allergies: Patient has No Known Allergies. Family History: Patient family history includes Arthritis in her sister; Celiac disease in her daughter; Coronary artery disease in her father and mother; Diabetes in her father; Healthy in her brother; Hypertension in her sister; IgA nephropathy in her daughter; Lymphoma in her mother. Social History:  Patient  reports that she has never smoked. She has never used smokeless tobacco. She reports previous alcohol use. She reports that she does not use drugs.  Review of Systems: Constitutional: Negative for fever malaise or anorexia Cardiovascular: negative for chest pain Respiratory: negative for SOB or persistent cough Gastrointestinal: negative for abdominal pain  Objective  Vitals: There were no vitals  taken for this visit. General: no acute distress , A&Ox3 GYN: moist perineum from urine, vulva is red w/o rash or thinning of skin, cystocele present. No vaginal discharge present. No ulcerations or sores. No excoriations or swelling.  Skin:  Warm, no rashes  Assessment  1. Acute vaginitis      Plan   Vaginal itching:  ? Due to  irritation from incontinence. Recommend barrier creams. Await vaginal swab for yeast, trich or BV and treat if positive. Discuss with urology mgt of incontinence.   Follow up: sept for cpe    Commons side effects, risks, benefits, and alternatives for medications and treatment plan prescribed today were discussed, and the patient expressed understanding of the given instructions. Patient is instructed to call or message via MyChart if he/she has any questions or concerns regarding our treatment plan. No barriers to understanding were identified. We discussed Red Flag symptoms and signs in detail. Patient expressed understanding regarding what to do in case of urgent or emergency type symptoms.   Medication list was reconciled, printed and provided to the patient in AVS. Patient instructions and summary information was reviewed with the patient as documented in the AVS. This note was prepared with assistance of Dragon voice recognition software. Occasional wrong-word or sound-a-like substitutions may have occurred due to the inherent limitations of voice recognition software  No orders of the defined types were placed in this encounter.  No orders of the defined types were placed in this encounter.

## 2019-06-26 LAB — CERVICOVAGINAL ANCILLARY ONLY
Bacterial vaginitis: NEGATIVE
Candida vaginitis: POSITIVE — AB
Trichomonas: NEGATIVE

## 2019-06-30 ENCOUNTER — Encounter: Payer: Self-pay | Admitting: Family Medicine

## 2019-07-03 MED ORDER — FLUCONAZOLE 150 MG PO TABS: ORAL_TABLET | ORAL | 0 refills | Status: DC

## 2019-08-03 ENCOUNTER — Other Ambulatory Visit: Payer: Self-pay | Admitting: Family Medicine

## 2019-09-01 ENCOUNTER — Other Ambulatory Visit: Payer: Self-pay | Admitting: Family Medicine

## 2019-09-05 ENCOUNTER — Other Ambulatory Visit: Payer: Self-pay

## 2019-09-05 ENCOUNTER — Ambulatory Visit (INDEPENDENT_AMBULATORY_CARE_PROVIDER_SITE_OTHER): Payer: 59 | Admitting: Family Medicine

## 2019-09-05 ENCOUNTER — Encounter: Payer: Self-pay | Admitting: Family Medicine

## 2019-09-05 ENCOUNTER — Ambulatory Visit: Payer: 59 | Admitting: Family Medicine

## 2019-09-05 VITALS — BP 120/84 | HR 71 | Temp 97.8°F | Resp 16 | Ht 63.0 in | Wt 201.0 lb

## 2019-09-05 DIAGNOSIS — Z0000 Encounter for general adult medical examination without abnormal findings: Secondary | ICD-10-CM

## 2019-09-05 DIAGNOSIS — I8510 Secondary esophageal varices without bleeding: Secondary | ICD-10-CM

## 2019-09-05 DIAGNOSIS — K580 Irritable bowel syndrome with diarrhea: Secondary | ICD-10-CM | POA: Diagnosis not present

## 2019-09-05 DIAGNOSIS — K7581 Nonalcoholic steatohepatitis (NASH): Secondary | ICD-10-CM | POA: Diagnosis not present

## 2019-09-05 DIAGNOSIS — E782 Mixed hyperlipidemia: Secondary | ICD-10-CM

## 2019-09-05 DIAGNOSIS — M51369 Other intervertebral disc degeneration, lumbar region without mention of lumbar back pain or lower extremity pain: Secondary | ICD-10-CM

## 2019-09-05 DIAGNOSIS — Z8679 Personal history of other diseases of the circulatory system: Secondary | ICD-10-CM

## 2019-09-05 DIAGNOSIS — Z23 Encounter for immunization: Secondary | ICD-10-CM | POA: Diagnosis not present

## 2019-09-05 DIAGNOSIS — I10 Essential (primary) hypertension: Secondary | ICD-10-CM | POA: Diagnosis not present

## 2019-09-05 DIAGNOSIS — K7460 Unspecified cirrhosis of liver: Secondary | ICD-10-CM

## 2019-09-05 DIAGNOSIS — M5136 Other intervertebral disc degeneration, lumbar region: Secondary | ICD-10-CM

## 2019-09-05 DIAGNOSIS — D696 Thrombocytopenia, unspecified: Secondary | ICD-10-CM

## 2019-09-05 LAB — TSH: TSH: 1.93 u[IU]/mL (ref 0.35–4.50)

## 2019-09-05 LAB — CBC WITH DIFFERENTIAL/PLATELET
Basophils Absolute: 0 10*3/uL (ref 0.0–0.1)
Basophils Relative: 0.7 % (ref 0.0–3.0)
Eosinophils Absolute: 0.1 10*3/uL (ref 0.0–0.7)
Eosinophils Relative: 1.9 % (ref 0.0–5.0)
HCT: 41 % (ref 36.0–46.0)
Hemoglobin: 13.7 g/dL (ref 12.0–15.0)
Lymphocytes Relative: 21.4 % (ref 12.0–46.0)
Lymphs Abs: 1.4 10*3/uL (ref 0.7–4.0)
MCHC: 33.6 g/dL (ref 30.0–36.0)
MCV: 91.7 fl (ref 78.0–100.0)
Monocytes Absolute: 0.6 10*3/uL (ref 0.1–1.0)
Monocytes Relative: 9.6 % (ref 3.0–12.0)
Neutro Abs: 4.3 10*3/uL (ref 1.4–7.7)
Neutrophils Relative %: 66.4 % (ref 43.0–77.0)
Platelets: 130 10*3/uL — ABNORMAL LOW (ref 150.0–400.0)
RBC: 4.47 Mil/uL (ref 3.87–5.11)
RDW: 15.4 % (ref 11.5–15.5)
WBC: 6.4 10*3/uL (ref 4.0–10.5)

## 2019-09-05 LAB — COMPREHENSIVE METABOLIC PANEL
ALT: 18 U/L (ref 0–35)
AST: 39 U/L — ABNORMAL HIGH (ref 0–37)
Albumin: 3.4 g/dL — ABNORMAL LOW (ref 3.5–5.2)
Alkaline Phosphatase: 76 U/L (ref 39–117)
BUN: 9 mg/dL (ref 6–23)
CO2: 22 mEq/L (ref 19–32)
Calcium: 9.7 mg/dL (ref 8.4–10.5)
Chloride: 106 mEq/L (ref 96–112)
Creatinine, Ser: 1.09 mg/dL (ref 0.40–1.20)
GFR: 50.7 mL/min — ABNORMAL LOW (ref >60.00)
Glucose, Bld: 97 mg/dL (ref 70–99)
Potassium: 3.6 mEq/L (ref 3.5–5.1)
Sodium: 138 mEq/L (ref 135–145)
Total Bilirubin: 2.1 mg/dL — ABNORMAL HIGH (ref 0.2–1.2)
Total Protein: 6.9 g/dL (ref 6.0–8.3)

## 2019-09-05 LAB — LIPID PANEL
Cholesterol: 252 mg/dL — ABNORMAL HIGH (ref 0–200)
HDL: 54.7 mg/dL (ref >39.00)
NonHDL: 197.34
Total CHOL/HDL Ratio: 5
Triglycerides: 223 mg/dL — ABNORMAL HIGH (ref 0.0–149.0)
VLDL: 44.6 mg/dL — ABNORMAL HIGH (ref 0.0–40.0)

## 2019-09-05 LAB — LDL CHOLESTEROL, DIRECT: Direct LDL: 66 mg/dL

## 2019-09-05 NOTE — Addendum Note (Signed)
Addended by: Francis Dowse T on: 09/05/2019 10:06 AM   Modules accepted: Orders

## 2019-09-05 NOTE — Progress Notes (Signed)
Subjective  Chief Complaint  Patient presents with  . Annual Exam    Fasting  . Hypertension    HPI: Dawn Moon is a 63 y.o. female who presents to [facility] [place] today for a Female Wellness Visit. She also has the concerns and/or needs as listed above in the chief complaint. These will be addressed in addition to the Health Maintenance Visit.   Wellness Visit: annual visit with health maintenance review and exam without Pap   HM: Mammogram due next month the patient to schedule.  Pap smears are up-to-date.  Colon cancer screens are up-to-date.  Unfortunately she complains of mid afternoon fatigue daily.  Her stamina is low.  She denies chest pain or shortness of breath.  Appetite is fair but she continues to have postprandial nausea that limits her intake.  No vomiting.  No hematemesis. Chronic disease f/u and/or acute problem visit: (deemed necessary to be done in addition to the wellness visit):  Cirrhosis secondary to [facility] with thrombocytopenia, esophageal varices and history of hepatic encephalopathy: Reviewed [facility]'s gastroenterology note at the transplant center.  She continues on a beta-blocker antibiotics and diuretics to treat her associated problems.  Mostly stable however she feels she may get some fluid on board.  Her weight is up 3 to 5 pounds from her baseline.  She does feel that her abdomen becomes tight at times.  She denies orthopnea.  She reports right upper quadrant abdominal pain over the last week for unclear reasons.  Pain does radiate to right shoulder.  Currently not a transplant candidate.  She is being followed by [facility] every 6 months.  I reviewed the most recent labs from June.  Hypertension hyperlipidemia have been controlled.  Due for lab work today.  Continue current medications.  History of SVT without any palpitations or chest pain recently  Secondary insomnia well controlled on intermittent trazodone.  Chronic low back pain  using TENS unit and urine creatinine is  Assessment  1. Annual physical exam   2. Liver cirrhosis secondary to NASH ([place])   3. Irritable bowel syndrome with diarrhea   4. Essential hypertension   5. Mixed hyperlipidemia   6. Thrombocytopenia ([place])   7. History of paroxysmal supraventricular tachycardia   8. Secondary esophageal varices without bleeding [facility])      Plan  Female Wellness Visit:  Age appropriate Health Maintenance and Prevention measures were discussed with patient. Included topics are cancer screening recommendations, ways to keep healthy (see AVS) including dietary and exercise recommendations, regular eye and dental care, use of seat belts, and avoidance of moderate alcohol use and tobacco use.  Patient to schedule mammogram  BMI: discussed patient's BMI and encouraged positive lifestyle modifications to help get to or maintain a target BMI.  HM needs and immunizations were addressed and ordered. See below for orders. See HM and immunization section for updates.  Shingrix and flu shot today.  Routine labs and screening tests ordered including cmp, cbc and lipids where appropriate.  Discussed recommendations regarding Vit D and calcium supplementation (see AVS)  Chronic disease management visit and/or acute problem visit:  Cirrhosis: Check lab work.  Monitor renal function and may increase Lasix for 2 to 3 days as needed for lower extremity edema or perceived ascites.  Checking CBC and ammonia levels as well.  Blood pressure is controlled.  Recheck lipid levels today.  No change in current medications. Follow up: No follow-ups on file.  Orders Placed This Encounter  Procedures  . Ammonia  . CBC with Differential/Platelet  . Comprehensive metabolic panel  . TSH  . Lipid panel   No orders of the defined types were placed in this encounter.     Lifestyle: Body mass index is 35.61 kg/m. Wt Readings from Last 3 Encounters:  09/05/19 201 lb (91.2 kg)   06/21/19 200 lb (90.7 kg)  04/26/19 192 lb (87.1 kg)     Patient Active Problem List   Diagnosis Date Noted  . Secondary esophageal varices without bleeding ([place]) 06/03/2018    Priority: High    On Nadolol so does not need surveillance EGD   . Encephalopathy, hepatic ([place]) 05/13/2018    Priority: High  . History of paroxysmal supraventricular tachycardia     Priority: High    episode 2011  consult w/ dr Caryl Comes --  put on atenolol--  per pt no longer an issue   . Liver cirrhosis secondary to NASH ([place]) 04/23/2018    Priority: High    History of fatty liver when followed at [facility], records not yet reviewed serologic work-up not performed based upon this history but history compatible with [name] cirrhosis. Decompensation early 2019 with hepatic hydrothorax and ascites after total knee replacement and then hepatic encephalopathy. Other complication esophageal varices noted EGD, grade 2 06/03/2018 Child's B 7 points 06/22/2018   . Irritable bowel syndrome-diarrhea predominant 06/15/2017    Priority: High  . Hyperlipidemia     Priority: High  . Essential hypertension 02/13/2010    Priority: High    Qualifier: Diagnosis of  By: Burnett Kanaris     . Recurrent UTI 06/12/2019    Priority: Medium  . Secondary insomnia 04/10/2019    Priority: Medium  . Thrombocytopenia ([place]) 02/28/2019    Priority: Medium  . Degeneration of lumbar intervertebral disc 08/06/2018    Priority: Medium  . Gastroesophageal reflux disease 06/15/2017    Priority: Medium  . Kidney stone     Priority: Medium  . Anemia 02/28/2019   Health Maintenance  Topic Date Due  . INFLUENZA VACCINE  07/22/2019  . MAMMOGRAM  09/06/2019  . COLONOSCOPY  01/12/2021  . TETANUS/TDAP  01/24/2023  . PAP SMEAR-Modifier  08/26/2023  . Hepatitis C Screening  Completed  . HIV Screening  Completed   Immunization History  Administered Date(s) Administered  . Hepatitis A, Adult 06/22/2018, 01/09/2019  . Influenza,inj,Quad  PF,6+ Mos 02/21/2018, 08/25/2018  . Pneumococcal Conjugate-13 06/22/2018  . Zoster Recombinat (Shingrix) 09/16/2018   We updated and reviewed the patient's past history in detail and it is documented below. Allergies: Patient has No Known Allergies. Past Medical History Patient  has a past medical history of Ascites, Encephalopathy, hepatic ([place]) (05/13/2018), GERD (gastroesophageal reflux disease), History of exercise stress test, History of kidney stones, History of paroxysmal supraventricular tachycardia, HTN (hypertension), IBS (irritable bowel syndrome), Nonalcoholic steatohepatitis (NASH), Numbness and tingling of left lower extremity, OA (osteoarthritis), Pleural effusion on right, PONV (postoperative nausea and vomiting), Recurrent UTI (06/12/2019), Secondary esophageal varices without bleeding ([place]) (06/03/2018), Vitamin D deficiency, and Wears glasses. Past Surgical History Patient  has a past surgical history that includes Colonoscopy (2005); left heart catheterization with coronary angiogram (N/A, 01/11/2012); transthoracic echocardiogram (01/18/2012); CT CTA CORONARY W/CA SCORE W/CM &/OR WO/CM (01/01/2014); Tubal ligation (1985); Tonsillectomy and adenoidectomy (child); Achilles tendon surgery (Left, 06/2016); Extracorporeal shock wave lithotripsy (2010); Esophagogastroduodenoscopy (01/2014); Knee arthroscopy (Right, 12/04/2016); Knee Arthroplasty (Right, 03/24/2018); and Breast biopsy (Left, 02/2016). Family History: Patient family history includes Arthritis in her sister;  Celiac disease in her daughter; Coronary artery disease in her father and mother; Diabetes in her father; Healthy in her brother; Hypertension in her sister; IgA nephropathy in her daughter; Lymphoma in her mother. Social History:  Patient  reports that she has never smoked. She has never used smokeless tobacco. She reports previous alcohol use. She reports that she does not use drugs.  Review of Systems: Constitutional:  negative for fever +malaise Ophthalmic: negative for photophobia, double vision or loss of vision Cardiovascular: negative for chest pain,+  dyspnea on exertion, +new LE swelling Respiratory: negative for SOB or persistent cough Gastrointestinal: +for abdominal pain, change in bowel habits or melena Genitourinary: negative for dysuria or gross hematuria, no abnormal uterine bleeding or disharge Musculoskeletal: negative for new gait disturbance or muscular weakness Integumentary: negative for new or persistent rashes, no breast lumps Neurological: negative for TIA or stroke symptoms Psychiatric: negative for SI or delusions Allergic/Immunologic: negative for hives  Patient Care Team    Relationship Specialty Notifications Start End  Leamon Arnt, MD PCP - General Family Medicine  02/21/18   Lelon Perla, MD PCP - Cardiology Cardiology Admissions 04/25/18   Lelon Perla, MD Consulting Physician Cardiology  02/21/18   Rod Can, MD Consulting Physician Orthopedic Surgery  02/21/18   Enis Gash, MD Consulting Physician Transplant  07/18/18   Bjorn Loser, MD Consulting Physician Urology  06/12/19     Objective  Vitals: BP 120/84   Pulse 71   Temp 97.8 F (36.6 C) (Tympanic)   Resp 16   Ht 5' 3"  (1.6 m)   Wt 201 lb (91.2 kg)   SpO2 100%   BMI 35.61 kg/m  General:  Well developed, well nourished, no acute distress  Psych:  Alert and orientedx3,normal mood and affect HEENT:  Normocephalic, atraumatic, non-icteric sclera, PERRL, oropharynx is clear without mass or exudate, supple neck without adenopathy, mass or thyromegaly Cardiovascular:  Normal S1, S2, RRR without gallop, rub or murmur, nondisplaced PMI, + 1 pitting edema bilaterally LE Respiratory:  Good breath sounds bilaterally, CTAB with normal respiratory effort Gastrointestinal: normal bowel sounds, soft, non-tender, no noted masses. No HSM, ? Mild distention MSK: no deformities, contusions. Joints are without  erythema or swelling. Spine and CVA region are nontender Skin:  Warm, no rashes or suspicious lesions noted Neurologic:    Mental status is normal. CN 2-11 are normal. Gross motor and sensory exams are normal. Normal gait. No tremor, no asterixis. Breast Exam: No mass, skin retraction or nipple discharge is appreciated in either breast. No axillary adenopathy. Fibrocystic changes are not noted    Commons side effects, risks, benefits, and alternatives for medications and treatment plan prescribed today were discussed, and the patient expressed understanding of the given instructions. Patient is instructed to call or message via MyChart if he/she has any questions or concerns regarding our treatment plan. No barriers to understanding were identified. We discussed Red Flag symptoms and signs in detail. Patient expressed understanding regarding what to do in case of urgent or emergency type symptoms.   Medication list was reconciled, printed and provided to the patient in AVS. Patient instructions and summary information was reviewed with the patient as documented in the AVS. This note was prepared with assistance of Dragon voice recognition software. Occasional wrong-word or sound-a-like substitutions may have occurred due to the inherent limitations of voice recognition software

## 2019-09-05 NOTE — Addendum Note (Signed)
Addended by: Layla Barter on: 09/05/2019 09:22 AM   Modules accepted: Orders

## 2019-09-05 NOTE — Patient Instructions (Addendum)
Please return in 6 months for follow up of your hypertension.  I will release your lab results to you on your MyChart account with further instructions. Please reply with any questions.  Please schedule your mammogram.   Today you were given your 2nd of 2 Shingrix vaccinations and your flu vaccination.   If you have any questions or concerns, please don't hesitate to send me a message via MyChart or call the office at [phone]. Thank you for visiting with [place] today! It's our pleasure caring for you.

## 2019-09-06 NOTE — Progress Notes (Signed)
Please call patient: I have reviewed his/her lab results. Labs are stable. Kidney function is improved so OK to increase lasix to twice daily for 2-3 days as needed for swelling.  All other labs look fine. No changes recommended at this time.

## 2019-09-14 NOTE — Progress Notes (Signed)
HPI: FU hypertension and volume excess. CardioNet 2011 showed 4 beats of nonsustained ventricular tachycardia; also brief atrial tachycardia; note some of symptoms also noted to be sinus tachycardia. Patient was seen by Dr. Caryl Comes and started on verapamil. Her symptoms did not improve and she was changed to a beta blocker. Her symptoms improved with atenolol. Cardiac catheterization 1/13 revealed no obstructive coronary artery disease. Her ejection fraction was 55-65%. Cardiac CT January 2015 showed nonobstructive plaque in the proximal LAD.  Patient admitted April 2019 and underwent right knee replacement.  Following discharge she developed worsening dyspnea.  Echocardiogram May 2019 showed normal LV systolic function and mild left atrial enlargement. CTA May 2019 showed no pulmonary embolus.  There was a large right pleural effusion and lobulated liver compatible with cirrhosis.  There was ascites.  Patient underwent thoracentesis and paracentesis.  She was also diuresed. She was diagnosed with cirrhosis due to NASH. She was discharged on higher dose diuretics.  Patient then readmitted with confusion and diagnosed with hepatic encephalopathy and acute renal insufficiency.  Patient was treated with lactulose and xifaxan.  Diuretics were reduced.  She has been followed by gastroenterology as an outpatient.  She was referred to [facility] for consideration of transplant.  Note she did have 2 esophageal varices by EGD June 2019.  Since last seen,  she does have some dyspnea on exertion but no orthopnea or PND.  Minimal pedal edema.  No chest pain.  Occasional mild palpitations but no syncope.  Current Outpatient Medications  Medication Sig Dispense Refill  . Butalbital-APAP-Caffeine 50-300-40 MG CAPS Take 1 tablet by mouth every 8 (eight) hours as needed (migraine).    . Eluxadoline (VIBERZI) 75 MG TABS Take 75 mg by mouth 2 (two) times daily. 180 tablet 3  . furosemide (LASIX) 40 MG tablet TAKE 1 TABLET  DAILY 30 tablet 2  . hydrOXYzine (ATARAX/VISTARIL) 25 MG tablet TAKE 1 TABLET BY MOUTH 3 TIMES DAILY AS NEEDED FOR ANXIETY OR ITCHING. 90 tablet 0  . lactulose (CHRONULAC) 10 GM/15ML solution Take 15 mLs (10 g total) by mouth 2 (two) times daily as needed for mild constipation or moderate constipation.    . nadolol (CORGARD) 20 MG tablet TAKE 1/2 TO 1 TABLET ONCE DAILY AS DIRECTED 30 tablet 1  . pantoprazole (PROTONIX) 40 MG tablet Take 1 tablet (40 mg total) by mouth daily. 30 tablet 11  . promethazine (PHENERGAN) 25 MG tablet Take 1 tablet (25 mg total) by mouth every 6 (six) hours as needed for nausea or vomiting. 30 tablet 1  . rifaximin (XIFAXAN) 550 MG TABS tablet Take 1 tablet (550 mg total) by mouth 2 (two) times daily. 60 tablet 5  . spironolactone (ALDACTONE) 100 MG tablet TAKE 1 TABLET DAILY 30 tablet 2  . traZODone (DESYREL) 50 MG tablet TAKE 1/2 TO 1 TABLET AT BEDTIME AS NEEDED FOR SLEEP 90 tablet 0   No current facility-administered medications for this visit.      Past Medical History:  Diagnosis Date  . Ascites   . Encephalopathy, hepatic ([place]) 05/13/2018  . GERD (gastroesophageal reflux disease)   . History of exercise stress test    03-07-2010  Stress echo--- no arrhythmias or conduction abnormalilites and negative for ischemia or chest pain  . History of kidney stones   . History of paroxysmal supraventricular tachycardia    episode 2011  consult w/ dr Caryl Comes --  put on atenolol--  per pt no longer an issue  .  HTN (hypertension)    cardiologist --  dr Stanford Breed  . IBS (irritable bowel syndrome)    diarrhea  . Nonalcoholic steatohepatitis (NASH)   . Numbness and tingling of left lower extremity    post achilles tendon repair  . OA (osteoarthritis)    knees  . Pleural effusion on right    hepatic hydrothorax  . PONV (postoperative nausea and vomiting)   . Recurrent UTI 06/12/2019  . Secondary esophageal varices without bleeding ([place]) 06/03/2018  . Vitamin D  deficiency   . Wears glasses     Past Surgical History:  Procedure Laterality Date  . ACHILLES TENDON SURGERY Left 06/2016  . BREAST BIOPSY Left 02/2016   benign  . COLONOSCOPY  2005  . CT CTA CORONARY W/CA SCORE W/CM &/OR WO/CM  01/01/2014   non-obstructive calcified plaque in pLAD (0-25%), no significant incidental noncardiac findings noted  . ESOPHAGOGASTRODUODENOSCOPY  01/2014  . EXTRACORPOREAL SHOCK WAVE LITHOTRIPSY  2010  . KNEE ARTHROPLASTY Right 03/24/2018   Procedure: RIGHT TOTAL KNEE ARTHROPLASTY WITH COMPUTER NAVIGATION;  Surgeon: Rod Can, MD;  Location: [facility];  Service: Orthopedics;  Laterality: Right;  Needs RNFA  . KNEE ARTHROSCOPY Right 12/04/2016   Procedure: ARTHROSCOPY KNEE WITH PARTIAL MEDIAL MENISCECTOMY;  Surgeon: Rod Can, MD;  Location: [facility];  Service: Orthopedics;  Laterality: Right;  . LEFT HEART CATHETERIZATION WITH CORONARY ANGIOGRAM N/A 01/11/2012   Procedure: LEFT HEART CATHETERIZATION WITH CORONARY ANGIOGRAM;  Surgeon: Sherren Mocha, MD;  Location: [facility] CATH LAB;  Service: Cardiovascular;  Laterality: N/A;  widely patent coronary arterires without significant obstructive CAD,  normal LVF, ef 55-56%  . TONSILLECTOMY AND ADENOIDECTOMY  child  . TRANSTHORACIC ECHOCARDIOGRAM  01/18/2012   mild LVH,  ef 60%/  trivial TR  . TUBAL LIGATION  1985    Social History   Socioeconomic History  . Marital status: Married    Spouse name: Not on file  . Number of children: Not on file  . Years of education: Not on file  . Highest education level: Not on file  Occupational History  . 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 Financial resource strain: Not on file  . Food insecurity    Worry: Not on file    Inability: Not on file  . Transportation needs    Medical: Not on file    Non-medical: Not on file  Tobacco Use  . Smoking status: Never Smoker  . Smokeless tobacco: Never Used   Substance and Sexual Activity  . Alcohol use: Not Currently  . Drug use: No  . Sexual activity: Not on file  Lifestyle  . Physical activity    Days per week: Not on file    Minutes per session: Not on file  . Stress: Not on file  Relationships  . Social Herbalist on phone: Not on file    Gets together: Not on file    Attends religious service: Not on file    Active member of club or organization: Not on file    Attends meetings of clubs or organizations: Not on file    Relationship status: Not on file  . Intimate partner violence    Fear of current or ex partner: Not on file    Emotionally abused: Not on file    Physically abused: Not on file    Forced sexual activity: Not on file  Other Topics Concern  . 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  Has to walk up stairs to work and can do w/o Ss.  Lives @ home in [place] with family.    Family History  Problem Relation Age of Onset  . Lymphoma Mother        non-hodgkins - died @ 68  . Coronary artery disease Mother   . Coronary artery disease Father        CABG in his 72s, died @ 21  . Diabetes Father   . Healthy Brother   . Hypertension Sister   . Arthritis Sister   . IgA nephropathy Daughter   . Celiac disease Daughter     ROS: no fevers or chills, productive cough, hemoptysis, dysphasia, odynophagia, melena, hematochezia, dysuria, hematuria, rash, seizure activity, orthopnea, PND, pedal edema, claudication. Remaining systems are negative.  Physical Exam: Well-developed well-nourished in no acute distress.  Skin is warm and dry.  HEENT is normal.  Neck is supple.  Chest is clear to auscultation with normal expansion.  Cardiovascular exam is regular rate and rhythm.  Abdominal exam nontender or distended. No masses palpated.  Extremities show no edema. neuro grossly intact  ECG-sinus rhythm at a rate of 71, no ST changes.  Personally reviewed  A/P  1 palpitations/nonsustained ventricular tachycardia-plan to continue present dose of nadolol.  Symptoms are reasonably well controlled.  2 hypertension-blood pressure controlled.  Continue present medications and follow.  3 volume excess-this is previously felt secondary to cirrhosis.  Continue present dose of diuretics.  She will take an additional 40 mg of Lasix as needed for worsening dyspnea or lower extremity edema.  4 cirrhosis-due to [place].  Patient is followed by gastroenterology.  Last MRI at [facility] June 2020 showed cirrhotic liver with portal hypertension but no hepatocellular carcinoma.  Kirk Ruths, MD

## 2019-09-18 ENCOUNTER — Encounter: Payer: Self-pay | Admitting: Cardiology

## 2019-09-18 ENCOUNTER — Ambulatory Visit (INDEPENDENT_AMBULATORY_CARE_PROVIDER_SITE_OTHER): Payer: 59 | Admitting: Cardiology

## 2019-09-18 ENCOUNTER — Other Ambulatory Visit: Payer: Self-pay

## 2019-09-18 VITALS — BP 124/90 | HR 71 | Temp 96.1°F | Ht 63.0 in | Wt 198.0 lb

## 2019-09-18 DIAGNOSIS — I10 Essential (primary) hypertension: Secondary | ICD-10-CM

## 2019-09-18 DIAGNOSIS — E8779 Other fluid overload: Secondary | ICD-10-CM | POA: Diagnosis not present

## 2019-09-18 DIAGNOSIS — R002 Palpitations: Secondary | ICD-10-CM

## 2019-09-18 NOTE — Patient Instructions (Signed)

## 2019-09-20 ENCOUNTER — Other Ambulatory Visit (INDEPENDENT_AMBULATORY_CARE_PROVIDER_SITE_OTHER): Payer: 59

## 2019-09-20 DIAGNOSIS — R002 Palpitations: Secondary | ICD-10-CM

## 2019-09-20 DIAGNOSIS — I10 Essential (primary) hypertension: Secondary | ICD-10-CM | POA: Diagnosis not present

## 2019-10-02 ENCOUNTER — Encounter: Payer: Self-pay | Admitting: Family Medicine

## 2019-11-02 ENCOUNTER — Other Ambulatory Visit: Payer: Self-pay | Admitting: Family Medicine

## 2019-11-02 ENCOUNTER — Encounter: Payer: Self-pay | Admitting: Family Medicine

## 2019-11-02 MED ORDER — NADOLOL 20 MG PO TABS: 10.0000 mg | ORAL_TABLET | Freq: Every day | ORAL | 1 refills | Status: DC | PRN

## 2019-11-02 NOTE — Telephone Encounter (Signed)
Medication Refill - Medication: nadolol (CORGARD) 20 MG tablet  Pharmacy request  Preferred Pharmacy:  [place], [address]  [place] [address]  Phone: [phone] Fax: [phone]

## 2019-11-02 NOTE — Telephone Encounter (Signed)
Left voicemail for patient.   I'm happy to refill her medication if she no longer is seeing a local GI doctor.  I was not aware that she had stopped seeing Dr. Carlean Purl.   Refilled meds.

## 2019-11-21 ENCOUNTER — Other Ambulatory Visit: Payer: Self-pay | Admitting: Family Medicine

## 2019-11-21 DIAGNOSIS — Z1231 Encounter for screening mammogram for malignant neoplasm of breast: Secondary | ICD-10-CM

## 2019-11-24 ENCOUNTER — Ambulatory Visit: Payer: 59

## 2019-12-01 ENCOUNTER — Other Ambulatory Visit: Payer: Self-pay | Admitting: Family Medicine

## 2019-12-16 ENCOUNTER — Other Ambulatory Visit: Payer: Self-pay | Admitting: Internal Medicine

## 2019-12-18 ENCOUNTER — Other Ambulatory Visit: Payer: Self-pay | Admitting: Gastroenterology

## 2020-01-01 ENCOUNTER — Ambulatory Visit
Admission: RE | Admit: 2020-01-01 | Discharge: 2020-01-01 | Disposition: A | Discharge status: Home / Self Care | Payer: 59 | Source: Ambulatory Visit | Attending: Family Medicine | Admitting: Family Medicine

## 2020-01-01 ENCOUNTER — Other Ambulatory Visit: Payer: Self-pay

## 2020-01-01 DIAGNOSIS — Z1231 Encounter for screening mammogram for malignant neoplasm of breast: Secondary | ICD-10-CM

## 2020-01-03 ENCOUNTER — Other Ambulatory Visit: Payer: Self-pay | Admitting: Family Medicine

## 2020-01-03 DIAGNOSIS — R928 Other abnormal and inconclusive findings on diagnostic imaging of breast: Secondary | ICD-10-CM

## 2020-01-19 ENCOUNTER — Ambulatory Visit
Admission: RE | Admit: 2020-01-19 | Discharge: 2020-01-19 | Disposition: A | Discharge status: Home / Self Care | Payer: 59 | Source: Ambulatory Visit | Attending: Family Medicine | Admitting: Family Medicine

## 2020-01-19 ENCOUNTER — Other Ambulatory Visit: Payer: Self-pay

## 2020-01-19 ENCOUNTER — Other Ambulatory Visit: Payer: Self-pay | Admitting: Family Medicine

## 2020-01-19 DIAGNOSIS — R928 Other abnormal and inconclusive findings on diagnostic imaging of breast: Secondary | ICD-10-CM

## 2020-01-19 DIAGNOSIS — R921 Mammographic calcification found on diagnostic imaging of breast: Secondary | ICD-10-CM

## 2020-02-14 ENCOUNTER — Telehealth: Payer: Self-pay | Admitting: Internal Medicine

## 2020-02-14 NOTE — Telephone Encounter (Signed)
Pt stated that she has been followed by [facility] transplant team, and they wanted pt to schedule an EGD with Dr. Carlean Purl.  Pt would like to discuss.

## 2020-02-15 ENCOUNTER — Other Ambulatory Visit: Payer: Self-pay | Admitting: *Deleted

## 2020-02-15 DIAGNOSIS — K7460 Unspecified cirrhosis of liver: Secondary | ICD-10-CM

## 2020-02-15 DIAGNOSIS — R1013 Epigastric pain: Secondary | ICD-10-CM

## 2020-02-15 DIAGNOSIS — R112 Nausea with vomiting, unspecified: Secondary | ICD-10-CM

## 2020-02-15 NOTE — Telephone Encounter (Signed)
Dr. Carlean Purl, this patient had a telemedicine visit with Dr. Enis Gash yesterday and told the patient to call today to speak about getting an EGD scheduled. This is in Dr. Glade Stanford plan, #5 in [place]. Please advise?

## 2020-02-15 NOTE — Telephone Encounter (Signed)
Tell her ok for EGD in [place] but after reviewing the note I would also recommend the following  1) CBC, CMET, lipase, INR 2) complete abd [place]  dxes are cirrhosis due to NASH Epigastric pain Nausea and vomiting

## 2020-02-15 NOTE — Telephone Encounter (Signed)
Patient scheduled for the following (spoke with patient who is aware):   02/23/20 at 8:00 am PV with nurse (labs after visit, orders in Epic)  02/23/20 at 9:30 am Complete abd [place] (NPO after midnight and arrive 15 min early)  03/04/20 at 8:00 am EGD with Carlean Purl in [place]

## 2020-02-21 ENCOUNTER — Telehealth: Payer: Self-pay

## 2020-02-21 NOTE — Telephone Encounter (Signed)
[facility] is requesting labs for patient to have done here in the office and for [place] to fax results after completed. I have the orders that [facility] is requesting. They are requesting for CBC, Protime (INR), CMP to be done.   Please advise. Thanks  Results are to be faxed to Dr. Enis Gash at [phone] at [facility].

## 2020-02-23 ENCOUNTER — Other Ambulatory Visit (INDEPENDENT_AMBULATORY_CARE_PROVIDER_SITE_OTHER): Payer: 59

## 2020-02-23 ENCOUNTER — Ambulatory Visit (HOSPITAL_COMMUNITY)
Admission: RE | Admit: 2020-02-23 | Discharge: 2020-02-23 | Disposition: A | Discharge status: Home / Self Care | Payer: 59 | Source: Ambulatory Visit | Attending: Internal Medicine | Admitting: Internal Medicine

## 2020-02-23 ENCOUNTER — Other Ambulatory Visit: Payer: Self-pay

## 2020-02-23 ENCOUNTER — Ambulatory Visit (AMBULATORY_SURGERY_CENTER): Payer: Self-pay | Admitting: *Deleted

## 2020-02-23 VITALS — Temp 97.9°F | Ht 62.0 in | Wt 193.2 lb

## 2020-02-23 DIAGNOSIS — K7581 Nonalcoholic steatohepatitis (NASH): Secondary | ICD-10-CM

## 2020-02-23 DIAGNOSIS — R112 Nausea with vomiting, unspecified: Secondary | ICD-10-CM | POA: Diagnosis present

## 2020-02-23 DIAGNOSIS — K7460 Unspecified cirrhosis of liver: Secondary | ICD-10-CM

## 2020-02-23 DIAGNOSIS — R1013 Epigastric pain: Secondary | ICD-10-CM | POA: Diagnosis present

## 2020-02-23 DIAGNOSIS — I8510 Secondary esophageal varices without bleeding: Secondary | ICD-10-CM

## 2020-02-23 DIAGNOSIS — Z01818 Encounter for other preprocedural examination: Secondary | ICD-10-CM

## 2020-02-23 LAB — CBC WITH DIFFERENTIAL/PLATELET
Basophils Absolute: 0.1 10*3/uL (ref 0.0–0.1)
Basophils Relative: 0.9 % (ref 0.0–3.0)
Eosinophils Absolute: 0.3 10*3/uL (ref 0.0–0.7)
Eosinophils Relative: 3.6 % (ref 0.0–5.0)
HCT: 40.9 % (ref 36.0–46.0)
Hemoglobin: 13.8 g/dL (ref 12.0–15.0)
Lymphocytes Relative: 21.4 % (ref 12.0–46.0)
Lymphs Abs: 1.5 10*3/uL (ref 0.7–4.0)
MCHC: 33.7 g/dL (ref 30.0–36.0)
MCV: 91 fl (ref 78.0–100.0)
Monocytes Absolute: 0.8 10*3/uL (ref 0.1–1.0)
Monocytes Relative: 11.4 % (ref 3.0–12.0)
Neutro Abs: 4.4 10*3/uL (ref 1.4–7.7)
Neutrophils Relative %: 62.7 % (ref 43.0–77.0)
Platelets: 110 10*3/uL — ABNORMAL LOW (ref 150.0–400.0)
RBC: 4.49 Mil/uL (ref 3.87–5.11)
RDW: 15 % (ref 11.5–15.5)
WBC: 7.1 10*3/uL (ref 4.0–10.5)

## 2020-02-23 LAB — COMPREHENSIVE METABOLIC PANEL
ALT: 25 U/L (ref 0–35)
AST: 48 U/L — ABNORMAL HIGH (ref 0–37)
Albumin: 3.3 g/dL — ABNORMAL LOW (ref 3.5–5.2)
Alkaline Phosphatase: 87 U/L (ref 39–117)
BUN: 10 mg/dL (ref 6–23)
CO2: 27 mEq/L (ref 19–32)
Calcium: 9.4 mg/dL (ref 8.4–10.5)
Chloride: 101 mEq/L (ref 96–112)
Creatinine, Ser: 1.36 mg/dL — ABNORMAL HIGH (ref 0.40–1.20)
GFR: 39.21 mL/min — ABNORMAL LOW (ref >60.00)
Glucose, Bld: 104 mg/dL — ABNORMAL HIGH (ref 70–99)
Potassium: 3.7 mEq/L (ref 3.5–5.1)
Sodium: 137 mEq/L (ref 135–145)
Total Bilirubin: 2.9 mg/dL — ABNORMAL HIGH (ref 0.2–1.2)
Total Protein: 7.2 g/dL (ref 6.0–8.3)

## 2020-02-23 LAB — PROTIME-INR
INR: 1.2 ratio — ABNORMAL HIGH (ref 0.8–1.0)
Prothrombin Time: 13.7 s — ABNORMAL HIGH (ref 9.6–13.1)

## 2020-02-23 LAB — LIPASE: Lipase: 93 U/L — ABNORMAL HIGH (ref 11.0–59.0)

## 2020-02-23 NOTE — Progress Notes (Signed)
PONV- no trouble with MAC with last procedure  Pt is aware that care partner will wait in the car during procedure; if they feel like they will be too hot or cold to wait in the car; they may wait in the 4 th floor lobby. Patient is aware to bring only one care partner. We want them to wear a mask (we do not have any that we can provide them), practice social distancing, and we will check their temperatures when they get here.  I did remind the patient that their care partner needs to stay in the parking lot the entire time and have a cell phone available, we will call them when the pt is ready for discharge. Patient will wear mask into building.  covid test 02-26-20 at 3:40 pm   No trouble difficulty with intubation or hx/fam hx of malignant hyperthermia per pt  . No egg or soy allergy  No home oxygen use   No medications for weight loss taken  emmi information given

## 2020-02-26 ENCOUNTER — Ambulatory Visit (INDEPENDENT_AMBULATORY_CARE_PROVIDER_SITE_OTHER): Payer: 59

## 2020-02-26 ENCOUNTER — Encounter: Payer: Self-pay | Admitting: Internal Medicine

## 2020-02-26 ENCOUNTER — Other Ambulatory Visit: Payer: Self-pay

## 2020-02-26 DIAGNOSIS — Z1159 Encounter for screening for other viral diseases: Secondary | ICD-10-CM

## 2020-02-28 LAB — SARS CORONAVIRUS 2 (TAT 6-24 HRS): SARS Coronavirus 2: NEGATIVE

## 2020-02-29 ENCOUNTER — Ambulatory Visit (AMBULATORY_SURGERY_CENTER): Payer: 59 | Admitting: Internal Medicine

## 2020-02-29 ENCOUNTER — Other Ambulatory Visit: Payer: Self-pay

## 2020-02-29 ENCOUNTER — Encounter: Payer: Self-pay | Admitting: Internal Medicine

## 2020-02-29 ENCOUNTER — Other Ambulatory Visit (INDEPENDENT_AMBULATORY_CARE_PROVIDER_SITE_OTHER): Payer: 59

## 2020-02-29 VITALS — BP 118/75 | HR 84 | Temp 96.8°F | Resp 16 | Ht 63.0 in | Wt 193.0 lb

## 2020-02-29 DIAGNOSIS — R1013 Epigastric pain: Secondary | ICD-10-CM | POA: Diagnosis not present

## 2020-02-29 DIAGNOSIS — K766 Portal hypertension: Secondary | ICD-10-CM | POA: Diagnosis not present

## 2020-02-29 DIAGNOSIS — K3189 Other diseases of stomach and duodenum: Secondary | ICD-10-CM | POA: Diagnosis not present

## 2020-02-29 DIAGNOSIS — K7469 Other cirrhosis of liver: Secondary | ICD-10-CM

## 2020-02-29 DIAGNOSIS — I8510 Secondary esophageal varices without bleeding: Secondary | ICD-10-CM

## 2020-02-29 DIAGNOSIS — K7460 Unspecified cirrhosis of liver: Secondary | ICD-10-CM

## 2020-02-29 MED ORDER — SODIUM CHLORIDE 0.9 % IV SOLN: 500.0000 mL | Freq: Once | INTRAVENOUS | Status: DC

## 2020-02-29 NOTE — Progress Notes (Signed)
Report given to PACU, vss 

## 2020-02-29 NOTE — Patient Instructions (Addendum)
I saw the esophageal varices and also portal gastropathy (changes in stomach lining re;ated to blood flow changes due to cirrhosis).  I did not identify a cause for symptoms you are having.  I am repeating labs thinking of getting a CT scan, possibly MRI. I want to see the labs again and the kidney function before ordering.  I appreciate the opportunity to care for you. Gatha Mayer, MD, FACG   YOU HAD AN ENDOSCOPIC PROCEDURE TODAY AT [facility]:   Refer to the procedure report that was given to you for any specific questions about what was found during the examination.  If the procedure report does not answer your questions, please call your gastroenterologist to clarify.  If you requested that your care partner not be given the details of your procedure findings, then the procedure report has been included in a sealed envelope for you to review at your convenience later.  YOU SHOULD EXPECT: Some feelings of bloating in the abdomen. Passage of more gas than usual.  Walking can help get rid of the air that was put into your GI tract during the procedure and reduce the bloating. If you had a lower endoscopy (such as a colonoscopy or flexible sigmoidoscopy) you may notice spotting of blood in your stool or on the toilet paper. If you underwent a bowel prep for your procedure, you may not have a normal bowel movement for a few days.  Please Note:  You might notice some irritation and congestion in your nose or some drainage.  This is from the oxygen used during your procedure.  There is no need for concern and it should clear up in a day or so.  SYMPTOMS TO REPORT IMMEDIATELY:   Following upper endoscopy (EGD)  Vomiting of blood or coffee ground material  New chest pain or pain under the shoulder blades  Painful or persistently difficult swallowing  New shortness of breath  Fever of 100F or higher  Black, tarry-looking stools  For urgent or emergent issues, a  gastroenterologist can be reached at any hour by calling [phone]. Do not use MyChart messaging for urgent concerns.    DIET:  We do recommend a small meal at first, but then you may proceed to your regular diet.  Drink plenty of fluids but you should avoid alcoholic beverages for 24 hours.  ACTIVITY:  You should plan to take it easy for the rest of today and you should NOT DRIVE or use heavy machinery until tomorrow (because of the sedation medicines used during the test).    FOLLOW UP: Our staff will call the number listed on your records 48-72 hours following your procedure to check on you and address any questions or concerns that you may have regarding the information given to you following your procedure. If we do not reach you, we will leave a message.  We will attempt to reach you two times.  During this call, we will ask if you have developed any symptoms of COVID 19. If you develop any symptoms (ie: fever, flu-like symptoms, shortness of breath, cough etc.) before then, please call [phone].  If you test positive for Covid 19 in the 2 weeks post procedure, please call and report this information to [place].    If any biopsies were taken you will be contacted by phone or by letter within the next 1-3 weeks.  Please call us at [phone] if you have not heard about the biopsies in 3  weeks.    SIGNATURES/CONFIDENTIALITY: You and/or your care partner have signed paperwork which will be entered into your electronic medical record.  These signatures attest to the fact that that the information above on your After Visit Summary has been reviewed and is understood.  Full responsibility of the confidentiality of this discharge information lies with you and/or your care-partner.

## 2020-02-29 NOTE — Op Note (Signed)
[place] Patient Name: Dawn Moon Procedure Date: 02/29/2020 4:29 PM MRN: [id number] Endoscopist: Gatha Mayer , MD Age: 64 Referring MD:  Date of Birth: 01/21/1956 Gender: Female Account #: [id number] Procedure:                Upper GI endoscopy Indications:              Epigastric abdominal pain, Heartburn, Esophageal                            varices, Follow-up of esophageal varices Medicines:                Propofol per Anesthesia, Monitored Anesthesia Care Procedure:                Pre-Anesthesia Assessment:                           - Prior to the procedure, a History and Physical                            was performed, and patient medications and                            allergies were reviewed. The patient's tolerance of                            previous anesthesia was also reviewed. The risks                            and benefits of the procedure and the sedation                            options and risks were discussed with the patient.                            All questions were answered, and informed consent                            was obtained. Prior Anticoagulants: The patient has                            taken no previous anticoagulant or antiplatelet                            agents. ASA Grade Assessment: III - A patient with                            severe systemic disease. After reviewing the risks                            and benefits, the patient was deemed in                            satisfactory condition to undergo the procedure.  After obtaining informed consent, the endoscope was                            passed under direct vision. Throughout the                            procedure, the patient's blood pressure, pulse, and                            oxygen saturations were monitored continuously. The                            Endoscope was introduced through the mouth, and               advanced to the second part of duodenum. The upper                            GI endoscopy was accomplished without difficulty.                            The patient tolerated the procedure well. Scope In: Scope Out: Findings:                 Grade II, large (> 5 mm) varices were found in the                            middle third of the esophagus and in the lower                            third of the esophagus. They were 6 mm in largest                            diameter.                           Moderate portal hypertensive gastropathy was found                            in the cardia, in the gastric fundus and in the                            gastric body.                           The exam was otherwise without abnormality.                           The cardia and gastric fundus were normal on                            retroflexion. Complications:            No immediate complications. Estimated Blood Loss:     Estimated blood loss: none. Impression:               - Grade II and  large (> 5 mm) esophageal varices.                            No red wales or nipples, 6 mm max.                           - Portal hypertensive gastropathy.                           - The examination was otherwise normal.                           - No specimens collected. Recommendation:           - Patient has a contact number available for                            emergencies. The signs and symptoms of potential                            delayed complications were discussed with the                            patient. Return to normal activities tomorrow.                            Written discharge instructions were provided to the                            patient.                           - Resume previous diet.                           - Continue present medications.                           - Repeat upper endoscopy in 1 year for surveillance.                           -  RECHECK CMET, AMYLASE AND LIPASE TODAY (ORDERED)                           THINK WILL NEED CT VS MRI - NEED TO SEE KIDNEY FX                            AGAIN AS WAS WORSE THAN 1 YEAR AGO                           Has been off viberzi so that should not be culprit                           Will cc: Dr. Enis Gash at [facility], MD 02/29/2020 4:54:48 PM This report has been signed electronically.

## 2020-03-01 ENCOUNTER — Other Ambulatory Visit: Payer: Self-pay

## 2020-03-01 DIAGNOSIS — K760 Fatty (change of) liver, not elsewhere classified: Secondary | ICD-10-CM

## 2020-03-01 DIAGNOSIS — R1013 Epigastric pain: Secondary | ICD-10-CM

## 2020-03-01 LAB — COMPREHENSIVE METABOLIC PANEL
ALT: 20 U/L (ref 0–35)
AST: 37 U/L (ref 0–37)
Albumin: 3.1 g/dL — ABNORMAL LOW (ref 3.5–5.2)
Alkaline Phosphatase: 76 U/L (ref 39–117)
BUN: 10 mg/dL (ref 6–23)
CO2: 25 mEq/L (ref 19–32)
Calcium: 8.6 mg/dL (ref 8.4–10.5)
Chloride: 101 mEq/L (ref 96–112)
Creatinine, Ser: 1.29 mg/dL — ABNORMAL HIGH (ref 0.40–1.20)
GFR: 41.67 mL/min — ABNORMAL LOW (ref >60.00)
Glucose, Bld: 86 mg/dL (ref 70–99)
Potassium: 3.4 mEq/L — ABNORMAL LOW (ref 3.5–5.1)
Sodium: 135 mEq/L (ref 135–145)
Total Bilirubin: 2.6 mg/dL — ABNORMAL HIGH (ref 0.2–1.2)
Total Protein: 6.8 g/dL (ref 6.0–8.3)

## 2020-03-01 LAB — LIPASE: Lipase: 75 U/L — ABNORMAL HIGH (ref 11.0–59.0)

## 2020-03-01 LAB — AMYLASE: Amylase: 34 U/L (ref 27–131)

## 2020-03-04 ENCOUNTER — Encounter: Payer: 59 | Admitting: Internal Medicine

## 2020-03-04 ENCOUNTER — Telehealth: Payer: Self-pay

## 2020-03-04 NOTE — Telephone Encounter (Signed)
  Follow up Call-  Call back number 02/29/2020 06/03/2018  Post procedure Call Back phone  # [phone]  Permission to leave phone message Yes Yes  Some recent data might be hidden     Patient questions:  Do you have a fever, pain , or abdominal swelling? No. Pain Score  0 *  Have you tolerated food without any problems? Yes.    Have you been able to return to your normal activities? Yes.    Do you have any questions about your discharge instructions: Diet   No. Medications  No. Follow up visit  No.  Do you have questions or concerns about your Care? No.  Actions: * If pain score is 4 or above: No action needed, pain <4.  1. Have you developed a fever since your procedure? no  2.   Have you had an respiratory symptoms (SOB or cough) since your procedure? no  3.   Have you tested positive for COVID 19 since your procedure no  4.   Have you had any family members/close contacts diagnosed with the COVID 19 since your procedure?  no   If yes to any of these questions please route to Joylene John, RN and Alphonsa Gin, Therapist, sports.

## 2020-03-05 ENCOUNTER — Ambulatory Visit: Payer: 59 | Admitting: Family Medicine

## 2020-03-12 ENCOUNTER — Other Ambulatory Visit: Payer: Self-pay

## 2020-03-12 ENCOUNTER — Ambulatory Visit (INDEPENDENT_AMBULATORY_CARE_PROVIDER_SITE_OTHER)
Admission: RE | Admit: 2020-03-12 | Discharge: 2020-03-12 | Disposition: A | Discharge status: Home / Self Care | Payer: 59 | Source: Ambulatory Visit | Attending: Internal Medicine | Admitting: Internal Medicine

## 2020-03-12 DIAGNOSIS — K760 Fatty (change of) liver, not elsewhere classified: Secondary | ICD-10-CM | POA: Diagnosis not present

## 2020-03-12 DIAGNOSIS — R1013 Epigastric pain: Secondary | ICD-10-CM

## 2020-03-12 MED ORDER — IOHEXOL 300 MG/ML  SOLN
80.0000 mL | Freq: Once | INTRAMUSCULAR | Status: AC | PRN
  Administered 2020-03-12: 17:00:00 80 mL via INTRAVENOUS

## 2020-04-02 ENCOUNTER — Other Ambulatory Visit: Payer: Self-pay | Admitting: Family Medicine

## 2020-04-02 IMAGING — DX DG KNEE 1-2V PORT*R*
2 series · 2 of 2 positions shown · non-contrast
Comparison: None.

CLINICAL DATA: Status post right total knee arthroplasty.

EXAM:
PORTABLE RIGHT KNEE - 1-2 VIEW

[knee lat]
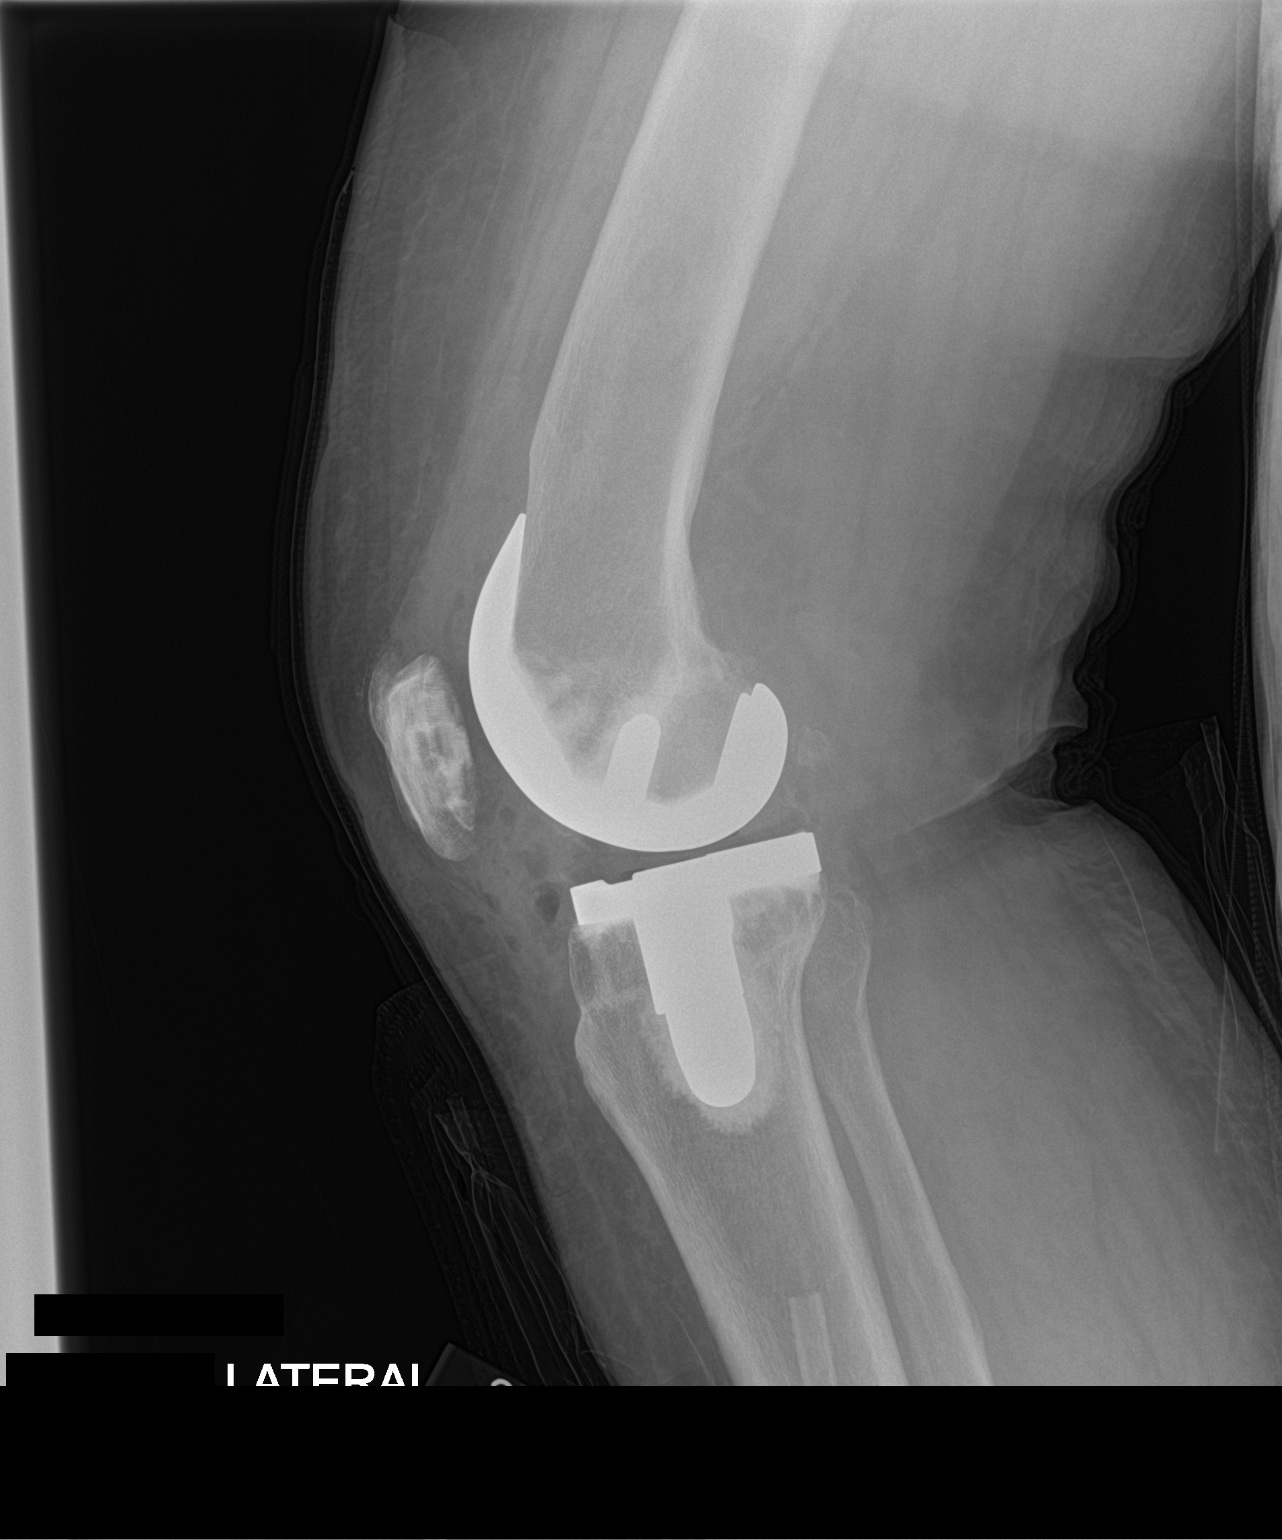

[knee ap]
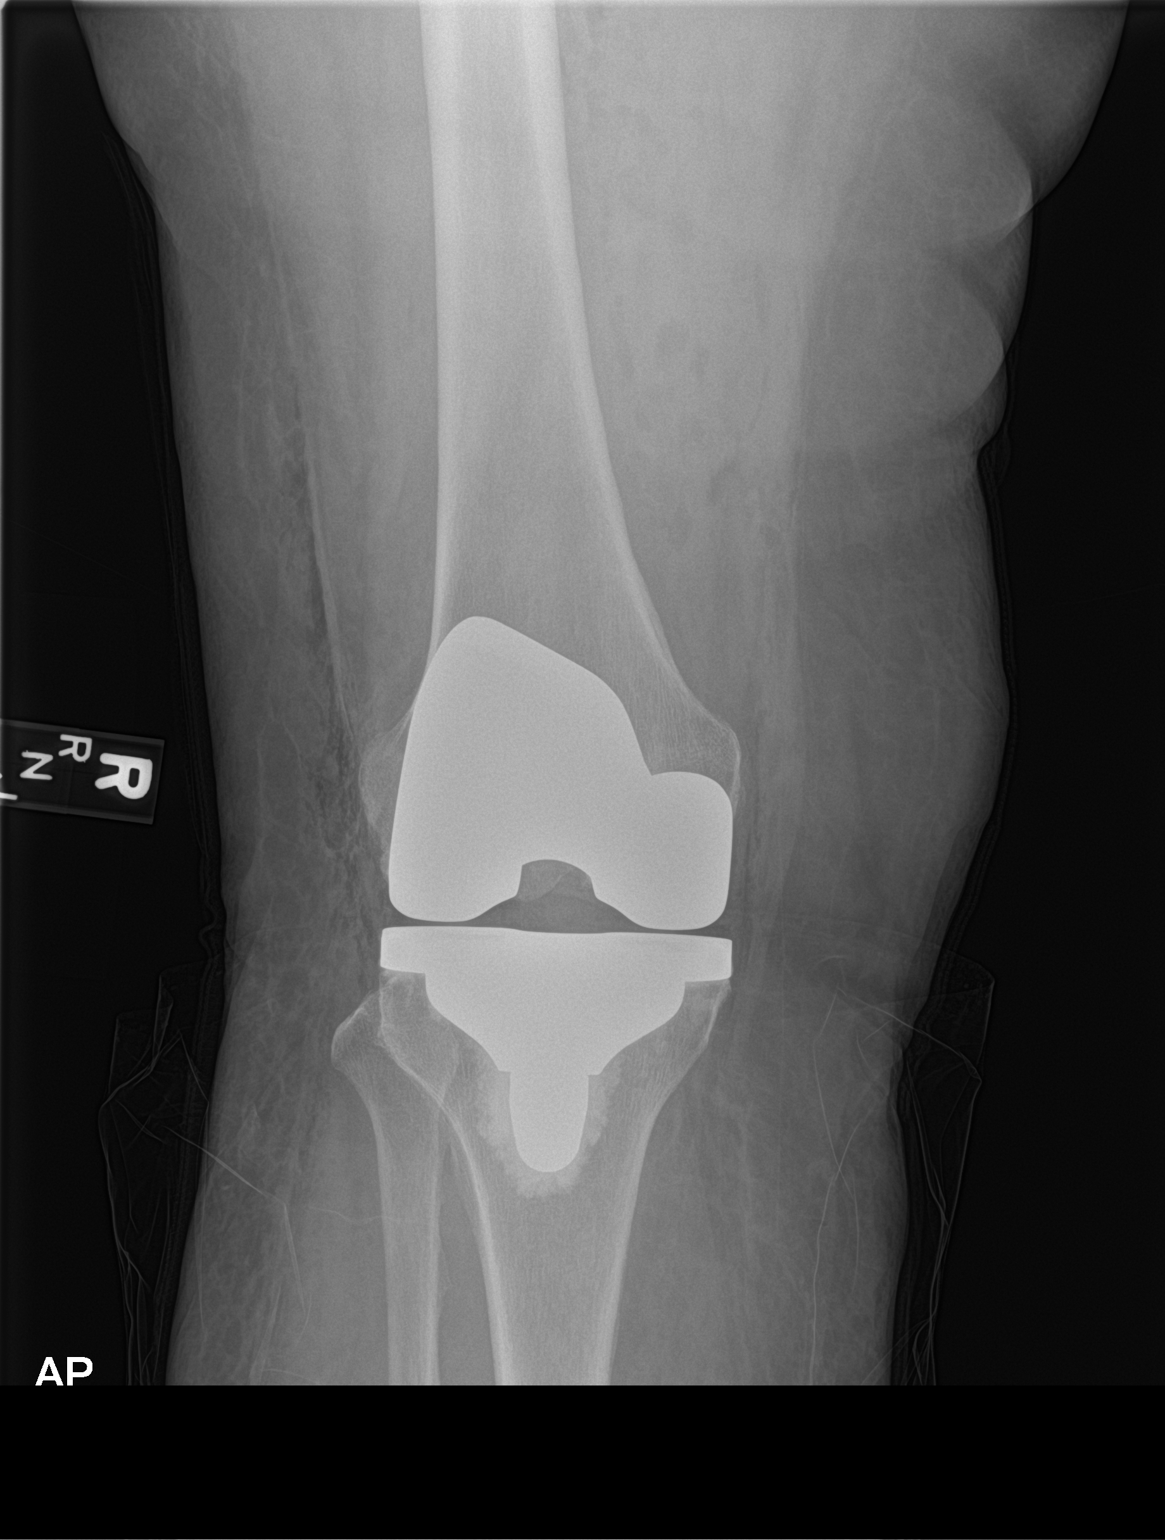

[2 of 2 positions shown; findings below may reference images not displayed]

FINDINGS: The femoral and tibial components appear to be well situated. No
fracture or dislocation is noted. Expected postoperative changes are
seen in the anterior soft tissues.
IMPRESSION: Status post right total knee arthroplasty.

## 2020-04-30 NOTE — Progress Notes (Signed)
Virtual Visit via Video Note   This visit type was conducted due to national recommendations for restrictions regarding the COVID-19 Pandemic (e.g. social distancing) in an effort to limit this patient's exposure and mitigate transmission in our community.  Due to her co-morbid illnesses, this patient is at least at moderate risk for complications without adequate follow up.  This format is felt to be most appropriate for this patient at this time.  All issues noted in this document were discussed and addressed.  A limited physical exam was performed with this format.  Please refer to the patient's chart for her consent to telehealth for [facility].   Date:  05/01/2020   ID:  Dawn Moon, DOB Aug 02, 1956, MRN [id number]  Patient Location: Home Provider Location: Home  PCP:  [name], NP  Cardiologist:  Kirk Ruths, MD  Electrophysiologist:  None   Evaluation Performed:  Follow-Up Visit  Chief Complaint:  Follow u  History of Present Illness:    Dawn Moon is a 64 y.o. female with we are following for ongoing assessment and management of brief atrial tachycardia and nonsustained ventricular tachycardia noted by [facility] in 2011.  Was seen by EP, Dr. Caryl Comes and started on verapamil.  However symptoms did not improve and therefore she was placed on beta-blocker, atenolol.  There was improvement of symptoms.  Cardiac catheterization on 12/2011 revealed no obstructive coronary artery disease with an EF of 55% to 65%.  Follow-up cardiac CT in January 2015 showed nonobstructive plaque in the proximal LAD.  She did undergo an echocardiogram in May 2019 which revealed normal LV systolic function and mild left atrial enlargement.  CTA in May 2019 revealed no pulmonary emboli but there was a large right pleural effusion and lobulated liver compatible with cirrhosis.  She was diagnosed with NASH.  She did have a recurrent admission due to hepatic encephalopathy and  acute renal insufficiency.  She was treated with lactulose and Xifaxan.  Diuretics were reduced, and she continues to be followed by GI as an outpatient.  She was also noted to have 2 esophageal varices by EGD in June 2019.  She was considered for a liver transplant by [facility].  Last MRI at [facility] June 2020, showed cirrhotic liver with portal hypertension but no hepatocellular carcinoma.  She was last seen by Dr. Stanford Breed on 09/18/2019.  She did have mild volume excess felt to be secondary to cirrhosis.  She was continued on her present dose of diuretics but was able to take an additional 40 mg of Lasix as needed for worsening dyspnea or lower extremity edema.  She did not have any recurrence of palpitations.  Blood pressure was well controlled.  She comes today without any cardiac complaints.  Continues to deal with NASH symptoms and management.  She is followed by Dr. Silvano Rusk and also by Dr. Enis Gash at [facility].  She is due to see Dr. Loletha Grayer in July 2021.  She denies chest pain dyspnea on exertion dizziness or palpitations.  She does have chronic fatigue.  Labs are being ordered through GI and primary care.  She is medically compliant.  The patient does not have symptoms concerning for COVID-19 infection (fever, chills, cough, or new shortness of breath).    Past Medical History:  Diagnosis Date  . Ascites   . Encephalopathy, hepatic ([place]) 05/13/2018  . GERD (gastroesophageal reflux disease)   . History of exercise stress test    03-07-2010  Stress echo---  no arrhythmias or conduction abnormalilites and negative for ischemia or chest pain  . History of kidney stones   . History of paroxysmal supraventricular tachycardia    episode 2011  consult w/ dr Caryl Comes --  put on atenolol--  per pt no longer an issue  . HTN (hypertension)    cardiologist --  dr Stanford Breed  . IBS (irritable bowel syndrome)    diarrhea  . Kidney stones    kidney infections as well  . Nonalcoholic  steatohepatitis (NASH)   . Numbness and tingling of left lower extremity    post achilles tendon repair  . OA (osteoarthritis)    knees  . Pleural effusion on right    hepatic hydrothorax  . PONV (postoperative nausea and vomiting)   . Recurrent UTI 06/12/2019  . Secondary esophageal varices without bleeding ([place]) 06/03/2018  . Vitamin D deficiency   . Wears glasses    Past Surgical History:  Procedure Laterality Date  . ACHILLES TENDON SURGERY Left 06/2016  . BREAST BIOPSY Left 02/2016   benign  . COLONOSCOPY  2005  . CT CTA CORONARY W/CA SCORE W/CM &/OR WO/CM  01/01/2014   non-obstructive calcified plaque in pLAD (0-25%), no significant incidental noncardiac findings noted  . ESOPHAGOGASTRODUODENOSCOPY  01/2014  . EXTRACORPOREAL SHOCK WAVE LITHOTRIPSY  2010  . KNEE ARTHROPLASTY Right 03/24/2018   Procedure: RIGHT TOTAL KNEE ARTHROPLASTY WITH COMPUTER NAVIGATION;  Surgeon: Rod Can, MD;  [facility]: [facility];  Service: Orthopedics;  Laterality: Right;  Needs RNFA  . KNEE ARTHROSCOPY Right 12/04/2016   Procedure: ARTHROSCOPY KNEE WITH PARTIAL MEDIAL MENISCECTOMY;  Surgeon: Rod Can, MD;  Location: [place];  Service: Orthopedics;  Laterality: Right;  . LEFT HEART CATHETERIZATION WITH CORONARY ANGIOGRAM N/A 01/11/2012   Procedure: LEFT HEART CATHETERIZATION WITH CORONARY ANGIOGRAM;  Surgeon: Sherren Mocha, MD;  Location: [facility] CATH LAB;  Service: Cardiovascular;  Laterality: N/A;  widely patent coronary arterires without significant obstructive CAD,  normal LVF, ef 55-56%  . TONSILLECTOMY AND ADENOIDECTOMY  child  . TRANSTHORACIC ECHOCARDIOGRAM  01/18/2012   mild LVH,  ef 60%/  trivial TR  . TUBAL LIGATION  1985  . UPPER GASTROINTESTINAL ENDOSCOPY       Current Meds  Medication Sig  . Butalbital-APAP-Caffeine 50-300-40 MG CAPS Take 1 tablet by mouth every 8 (eight) hours as needed (migraine).  . Eluxadoline (VIBERZI) 75 MG TABS Take 75 mg by mouth 2 (two)  times daily.  . furosemide (LASIX) 40 MG tablet TAKE 1 TABLET DAILY  . nadolol (CORGARD) 20 MG tablet Take 0.5-1 tablets (10-20 mg total) by mouth daily as needed.  Marland Kitchen omeprazole (PRILOSEC) 40 MG capsule Take by mouth daily.  . promethazine (PHENERGAN) 25 MG tablet Take 1 tablet (25 mg total) by mouth every 6 (six) hours as needed for nausea or vomiting.  . rifaximin (XIFAXAN) 550 MG TABS tablet Take 1 tablet (550 mg total) by mouth 2 (two) times daily.  Marland Kitchen spironolactone (ALDACTONE) 100 MG tablet TAKE 1 TABLET DAILY  . [DISCONTINUED] hydrOXYzine (ATARAX/VISTARIL) 25 MG tablet TAKE 1 TABLET BY MOUTH 3 TIMES DAILY AS NEEDED FOR ANXIETY OR ITCHING.     Allergies:   Patient has no known allergies.   Social History   Tobacco Use  . Smoking status: Never Smoker  . Smokeless tobacco: Never Used  Substance Use Topics  . Alcohol use: Not Currently  . Drug use: No     Family Hx: The patient's family history includes Arthritis in her sister;  Celiac disease in her daughter; Coronary artery disease in her father and mother; Diabetes in her father; Healthy in her brother; Hypertension in her sister; IgA nephropathy in her daughter; Lymphoma in her mother. There is no history of Colon cancer, Esophageal cancer, Stomach cancer, or Rectal cancer.  ROS:   Please see the history of present illness.    All other systems reviewed and are negative.   Prior CV studies:   The following studies were reviewed today: Echocardiogram 04/25/2018  Left ventricle: The cavity size was normal. Wall thickness was  normal. Systolic function was normal. The estimated ejection  fraction was in the range of 60% to 65%. Wall motion was normal;  there were no regional wall motion abnormalities. There was no  evidence of elevated ventricular filling pressure by Doppler  parameters.  - Left atrium: The atrium was mildly dilated.   Labs/Other Tests and Data Reviewed:    EKG:  No ECG reviewed.  Recent  Labs: 09/05/2019: TSH 1.93 02/23/2020: Hemoglobin 13.8; Platelets 110.0 02/29/2020: ALT 20; BUN 10; Creatinine, Ser 1.29; Potassium 3.4; Sodium 135   Recent Lipid Panel Lab Results  Component Value Date/Time   CHOL 252 (H) 09/05/2019 09:25 AM   CHOL 200 (H) 06/15/2017 09:55 AM   TRIG 223.0 (H) 09/05/2019 09:25 AM   HDL 54.70 09/05/2019 09:25 AM   HDL 50 06/15/2017 09:55 AM   CHOLHDL 5 09/05/2019 09:25 AM   LDLCALC 122 (H) 08/25/2018 08:50 AM   LDLCALC 112 (H) 06/15/2017 09:55 AM   LDLDIRECT 66.0 09/05/2019 09:25 AM    Wt Readings from Last 3 Encounters:  05/01/20 186 lb 8 oz (84.6 kg)  02/29/20 193 lb (87.5 kg)  02/23/20 193 lb 3.2 oz (87.6 kg)     Objective:    Vital Signs:  BP 120/78   Pulse 89   Ht 5' 3"  (1.6 m)   Wt 186 lb 8 oz (84.6 kg)   BMI 33.04 kg/m    VITAL SIGNS:  reviewed GEN:  no acute distress EYES:  sclerae anicteric, EOMI - Extraocular Movements Intact RESPIRATORY:  normal respiratory effort, symmetric expansion MUSCULOSKELETAL:  no obvious deformities. NEURO:  alert and oriented x 3, no obvious focal deficit PSYCH:  normal affect  ASSESSMENT & PLAN:    1. Hypertension: Currently well controlled today. Continue spironolactone and furosemide. Labs are completed frequently by PCP and by GI.  Medications are refilled by PCP  2. Palpitations: Continues on nadolol. No new issues there.   3. NASH; Significant symptoms with this. Being followed closely by GI with Dr. Carlean Purl and Dr. Loletha Grayer [facility]) on rifaximin.  She states this is a cost of $2000 a month, but is getting some financial help through program.     COVID-19 Education: The signs and symptoms of COVID-19 were discussed with the patient and how to seek care for testing (follow up with PCP or arrange E-visit).  The importance of social distancing was discussed today.  Time:   Today, I have spent 20 minutes with the patient with telehealth technology discussing the above problems.     Medication  Adjustments/Labs and Tests Ordered: Current medicines are reviewed at length with the patient today.  Concerns regarding medicines are outlined above.   Tests Ordered: No orders of the defined types were placed in this encounter.   Medication Changes: No orders of the defined types were placed in this encounter.   Disposition:  Follow up one year.  Signed, Phill Myron. [place] Pugh, ANP, AACC  05/01/2020 9:26 AM    [facility]

## 2020-05-01 ENCOUNTER — Encounter: Payer: Self-pay | Admitting: Adult Health

## 2020-05-01 ENCOUNTER — Telehealth (INDEPENDENT_AMBULATORY_CARE_PROVIDER_SITE_OTHER): Payer: 59 | Admitting: Adult Health

## 2020-05-01 VITALS — BP 120/78 | HR 89 | Ht 63.0 in | Wt 186.5 lb

## 2020-05-01 DIAGNOSIS — K7581 Nonalcoholic steatohepatitis (NASH): Secondary | ICD-10-CM | POA: Diagnosis not present

## 2020-05-01 DIAGNOSIS — K766 Portal hypertension: Secondary | ICD-10-CM

## 2020-05-01 DIAGNOSIS — R002 Palpitations: Secondary | ICD-10-CM | POA: Diagnosis not present

## 2020-05-01 DIAGNOSIS — I10 Essential (primary) hypertension: Secondary | ICD-10-CM | POA: Diagnosis not present

## 2020-05-01 DIAGNOSIS — K3189 Other diseases of stomach and duodenum: Secondary | ICD-10-CM

## 2020-05-01 NOTE — Patient Instructions (Signed)
Medication Instructions:  Continue current medications  *If you need a refill on your cardiac medications before your next appointment, please call your pharmacy*   Lab Work: None Ordered  Testing/Procedures: None Ordered   Follow-Up: At [facility], you and your health needs are our priority.  As part of our continuing mission to provide you with exceptional heart care, we have created designated Provider Care Teams.  These Care Teams include your primary Cardiologist (physician) and Advanced Practice Providers (APPs -  Physician Assistants and Nurse Practitioners) who all work together to provide you with the care you need, when you need it.  We recommend signing up for the patient portal called "MyChart".  Sign up information is provided on this After Visit Summary.  MyChart is used to connect with patients for Virtual Visits (Telemedicine).  Patients are able to view lab/test results, encounter notes, upcoming appointments, etc.  Non-urgent messages can be sent to your provider as well.   To learn more about what you can do with MyChart, go to [email or web address].    Your next appointment:   6 month(s)  The format for your next appointment:   In Person  Provider:   You may see Kirk Ruths, MD or one of the following Advanced Practice Providers on your designated Care Team:    Kerin Ransom, PA-C  [place], [place]  Coletta Memos, [place]

## 2020-05-23 IMAGING — US US RENAL
1 series · 14 of 25 positions shown · non-contrast
Comparison: CT abdomen and pelvis April 23, 2018

CLINICAL DATA: Right flank pain.  Hypertension.

EXAM:
RENAL / URINARY TRACT ULTRASOUND COMPLETE

[Series 1: us renal · 14 of 37 slices shown]
[im 1/37]
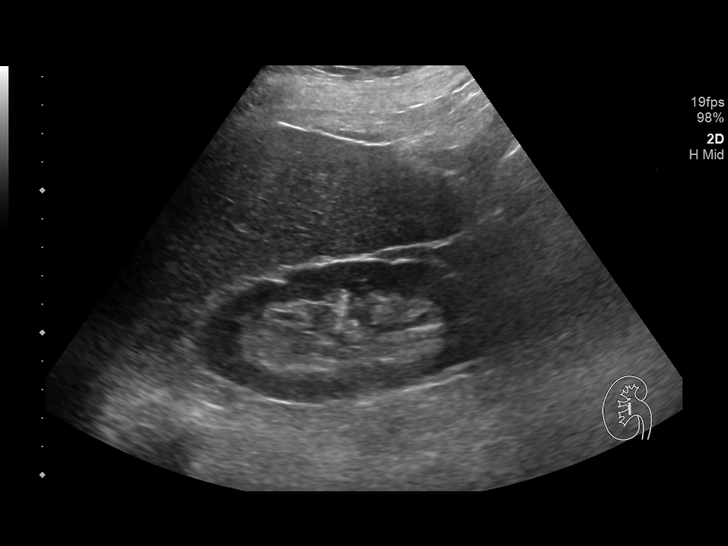
[im 4/37]
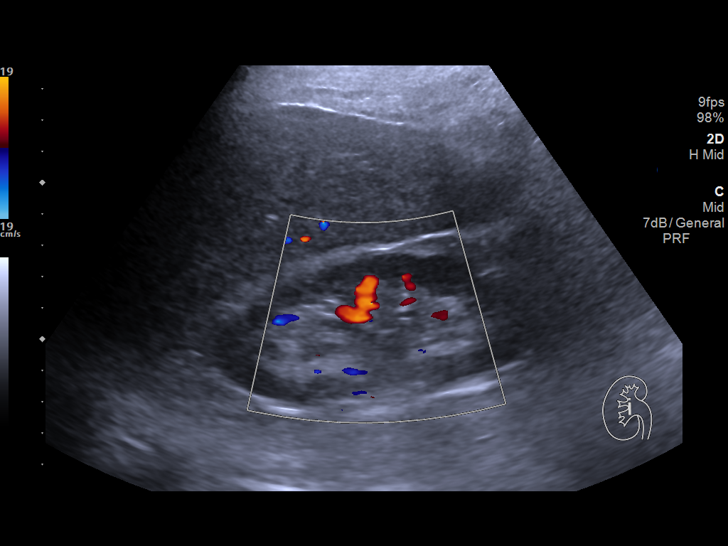
[im 7/37]
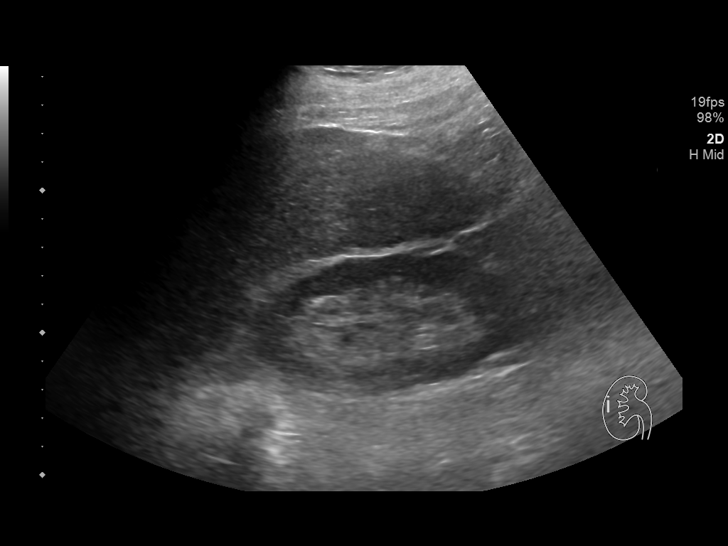
[im 10/37]
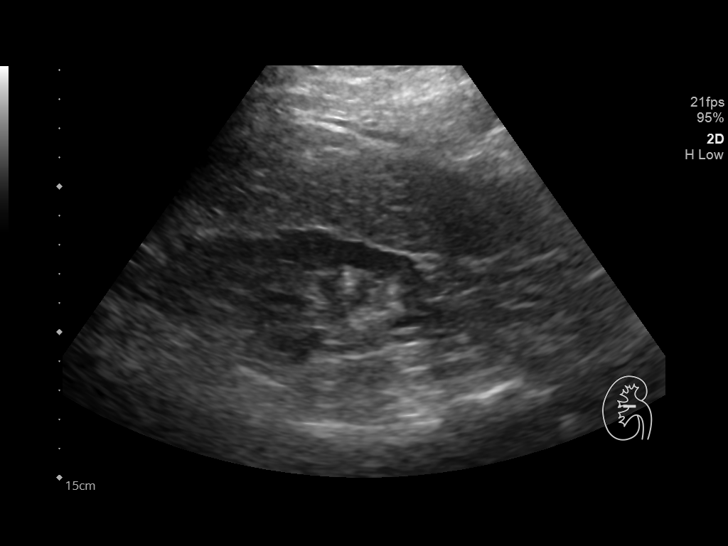
[im 13/37]
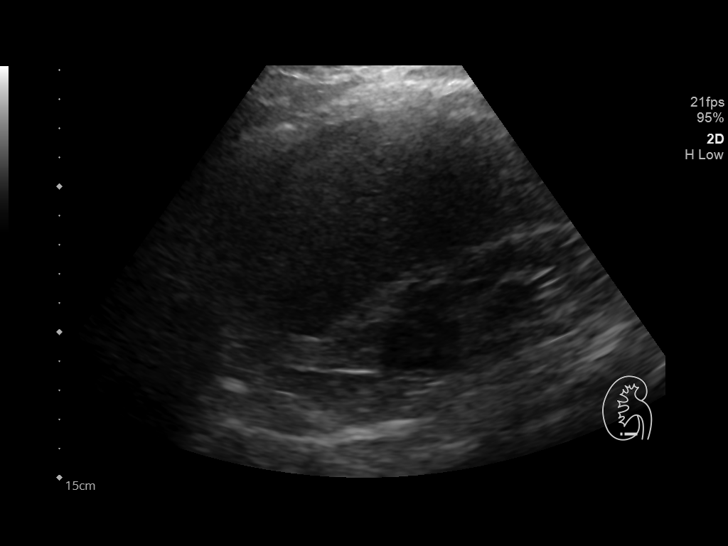
[im 14/37]
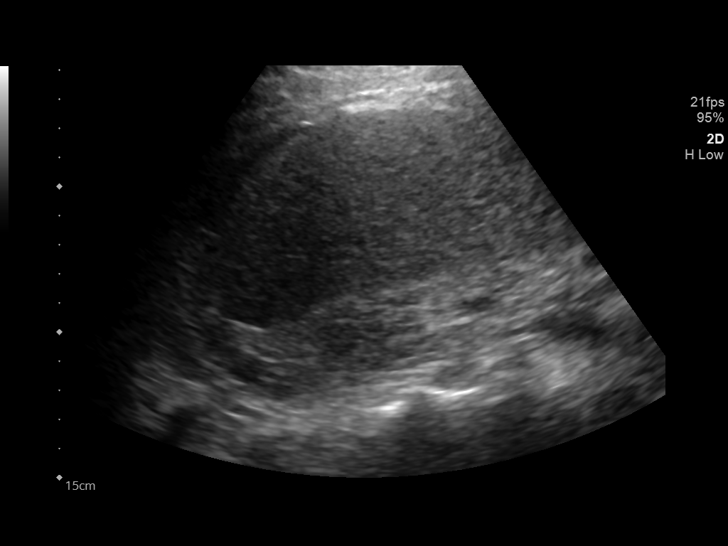
[im 17/37]
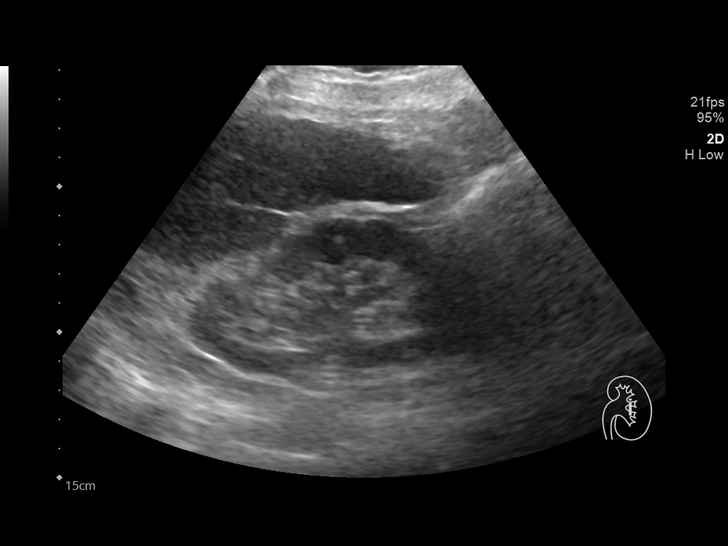
[im 20/37]
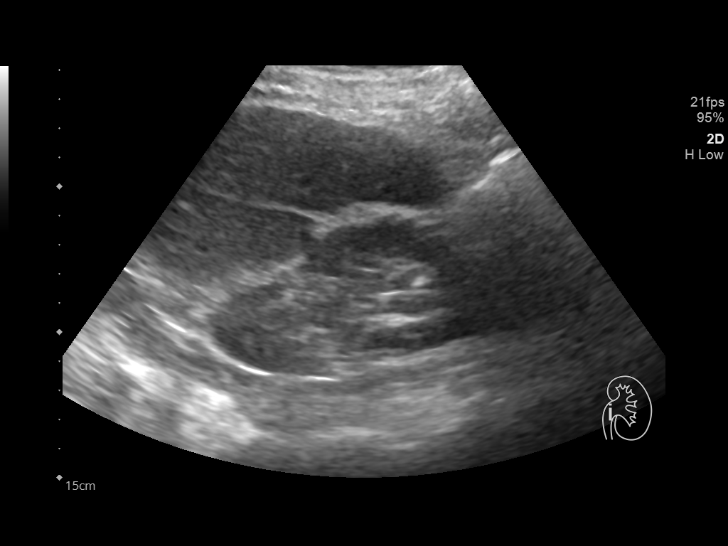
[im 23/37]
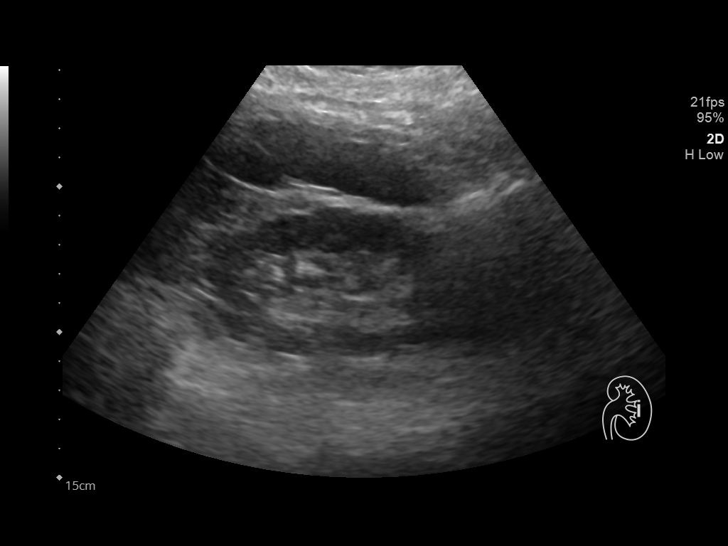
[im 25/37]
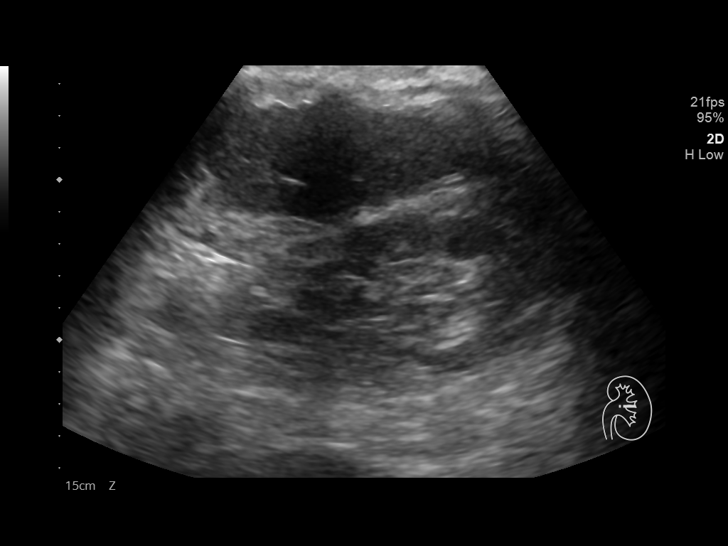
[im 28/37]
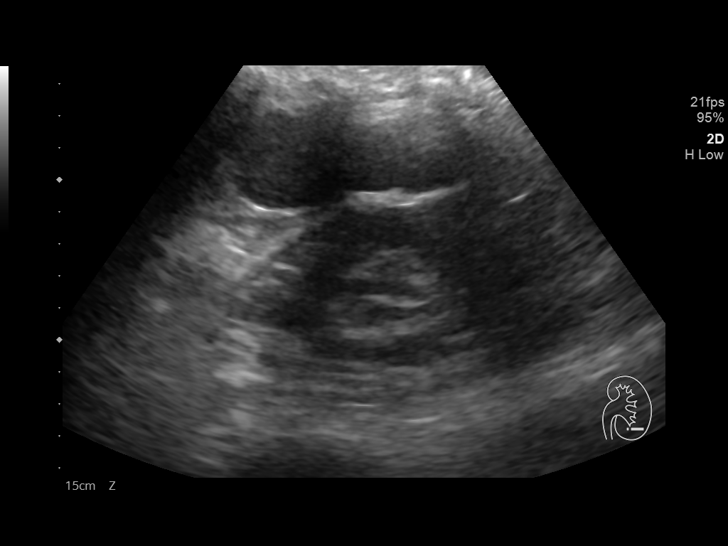
[im 31/37]
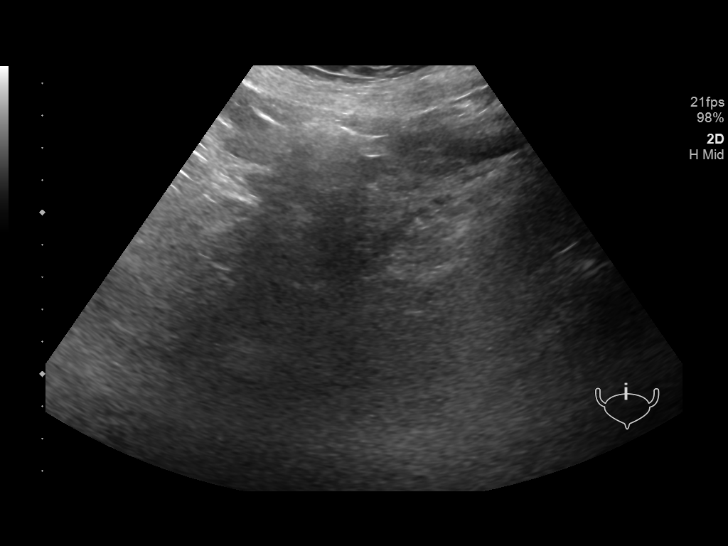
[im 34/37]
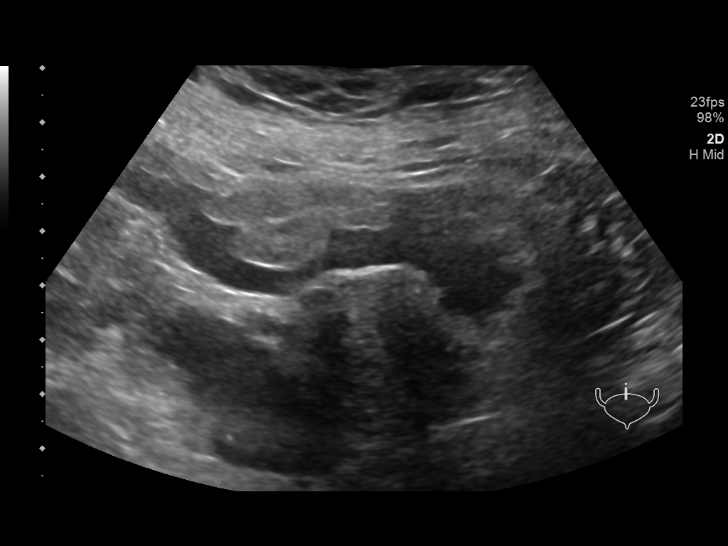
[im 37/37]
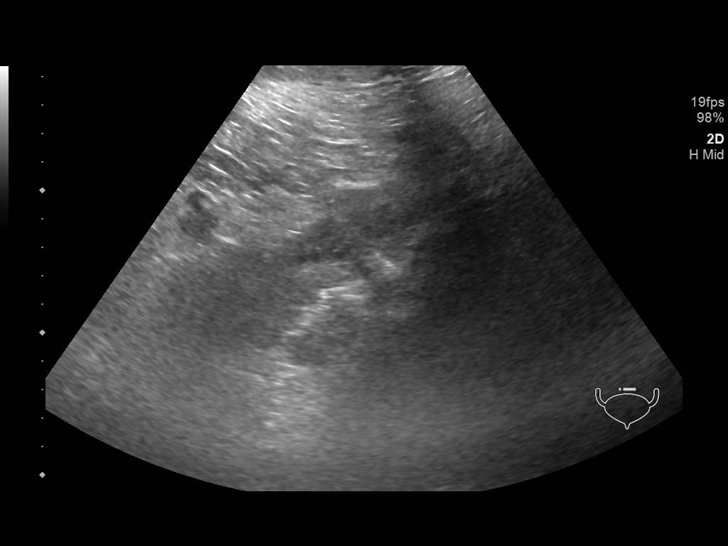

[14 of 25 positions shown; findings below may reference images not displayed]

FINDINGS: Right Kidney:

Length: 10.6 cm. Echogenicity and renal cortical thickness are
within normal limits. No mass, perinephric fluid, or hydronephrosis
visualized. No sonographically demonstrable calculus or
ureterectasis.

Left Kidney:

Length: 11.2 cm. Echogenicity and renal cortical thickness are
within normal limits. No mass, perinephric fluid, or hydronephrosis
visualized. No sonographically demonstrable calculus or
ureterectasis.

Bladder:

Appears normal for degree of bladder distention.

A small amount of ascites is noted. Incidental note is made of
splenic prominence. Spleen measures 12.3 x 7.5 x 12.1 cm with a
measured splenic volume of 587 cubic cm.
IMPRESSION: Normal appearing kidneys bilaterally.

Splenic prominence as noted.

Slight ascites.

## 2020-05-25 IMAGING — DX DG ABD PORTABLE 1V
2 series · 2 of 2 positions shown · non-contrast
Comparison: 04/23/2018

CLINICAL DATA: Right flank pain. Epigastric pain. Back pain.
Abdominal pain.

EXAM:
PORTABLE ABDOMEN - 1 VIEW

[abdomen kub (1 of 2)]
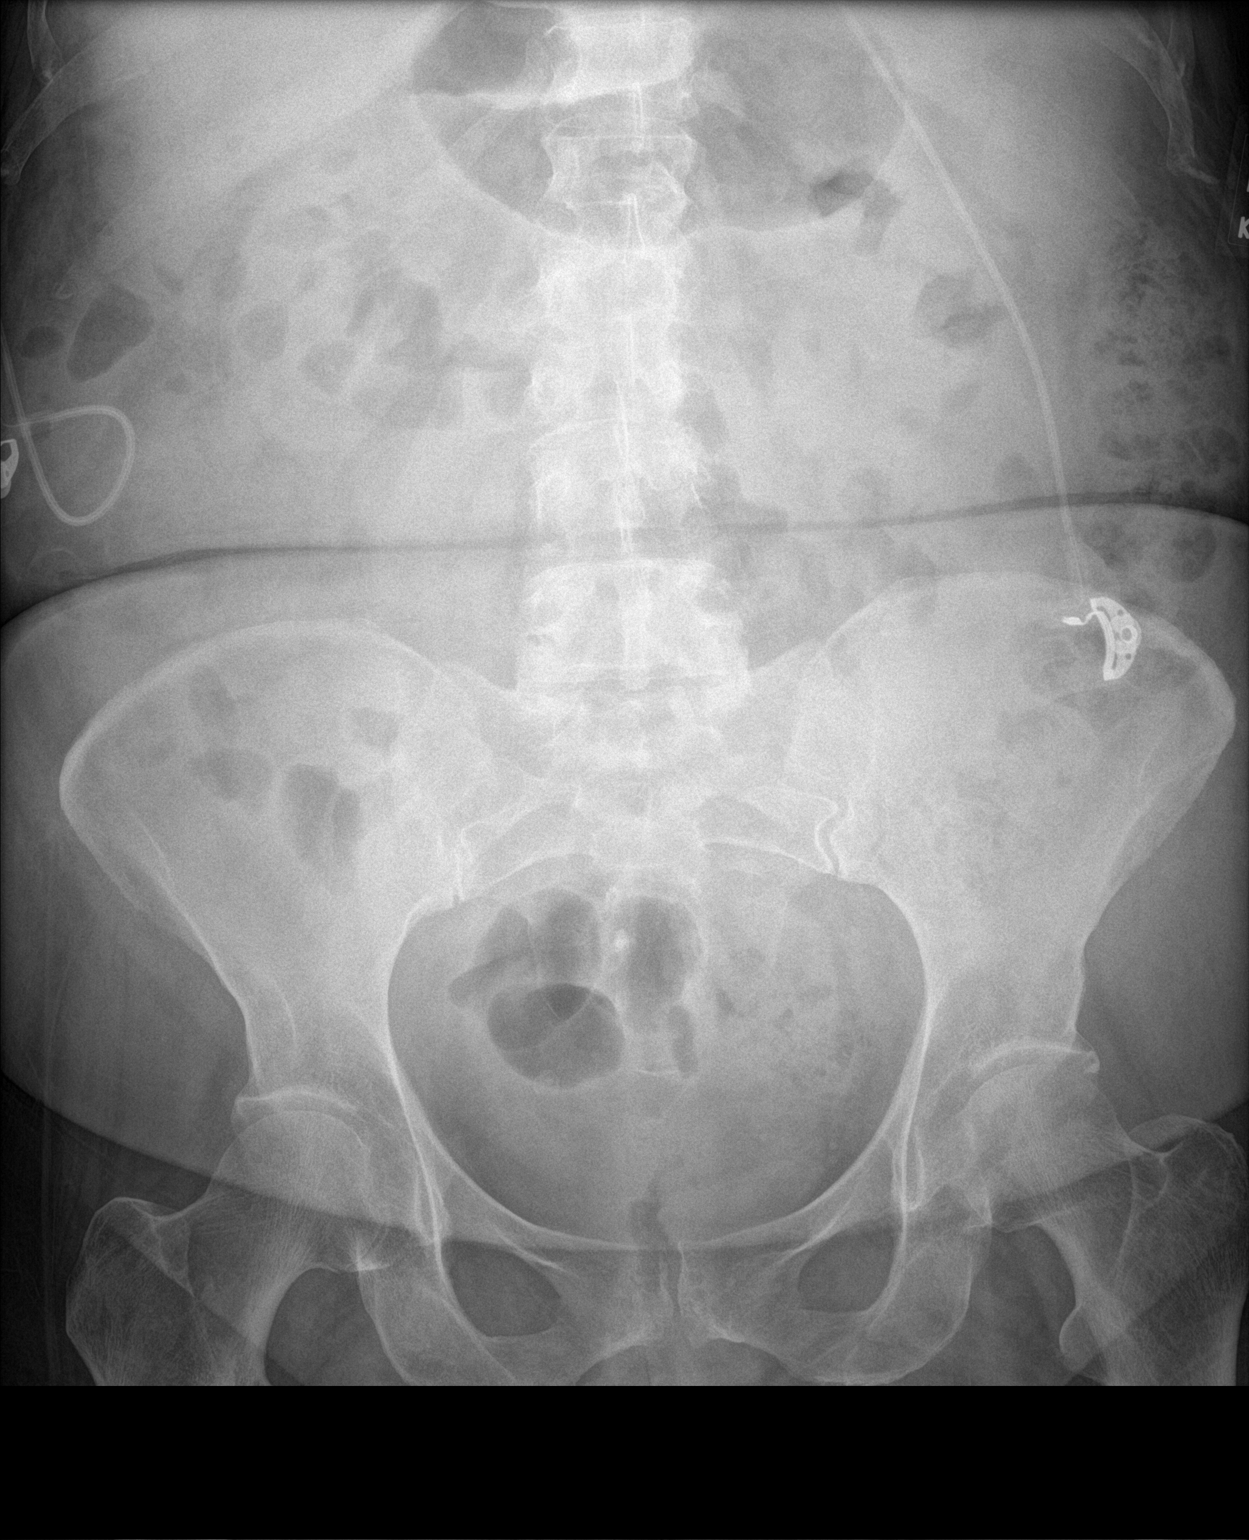

[abdomen kub (2 of 2)]
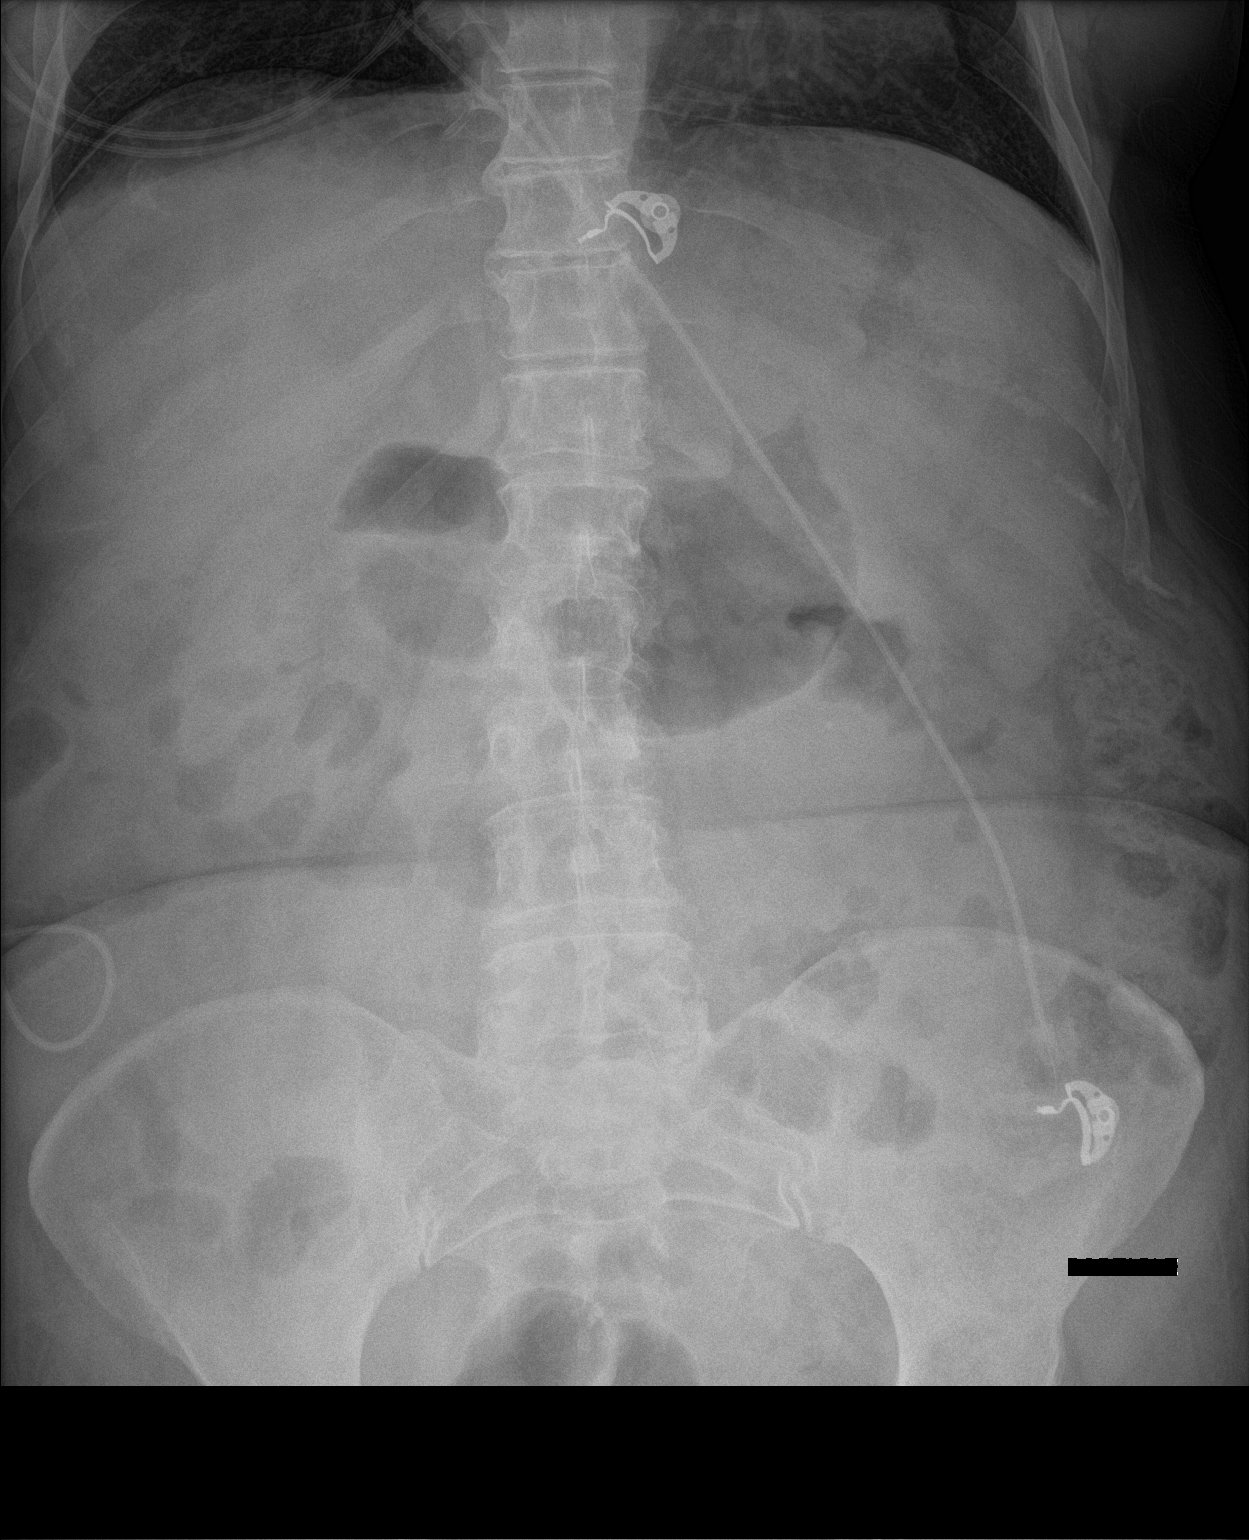

[2 of 2 positions shown; findings below may reference images not displayed]

FINDINGS: There are no disproportionally dilated loops of bowel. There is no
obvious free intraperitoneal gas. Small calcification projects over
the left renal shadow suggesting nephrolithiasis.
IMPRESSION: Nonobstructive bowel gas pattern.

## 2020-06-04 ENCOUNTER — Encounter: Payer: Self-pay | Admitting: Internal Medicine

## 2020-06-04 ENCOUNTER — Ambulatory Visit (INDEPENDENT_AMBULATORY_CARE_PROVIDER_SITE_OTHER): Payer: 59 | Admitting: Internal Medicine

## 2020-06-04 VITALS — BP 124/62 | HR 72 | Ht 63.5 in | Wt 187.5 lb

## 2020-06-04 DIAGNOSIS — K7581 Nonalcoholic steatohepatitis (NASH): Secondary | ICD-10-CM

## 2020-06-04 DIAGNOSIS — K580 Irritable bowel syndrome with diarrhea: Secondary | ICD-10-CM

## 2020-06-04 DIAGNOSIS — K7460 Unspecified cirrhosis of liver: Secondary | ICD-10-CM | POA: Diagnosis not present

## 2020-06-04 MED ORDER — DIPHENOXYLATE-ATROPINE 2.5-0.025 MG PO TABS: ORAL_TABLET | ORAL | 2 refills | Status: DC

## 2020-06-04 NOTE — Patient Instructions (Signed)
We have sent the following medications to your pharmacy for you to pick up at your convenience: Lomotil   Follow up with Dr Carlean Purl as needed.    I appreciate the opportunity to care for you. Silvano Rusk, MD, [facility]

## 2020-06-04 NOTE — Progress Notes (Signed)
Dawn Moon 64 y.o. [birth year] [id number]  Assessment & Plan:  Irritable bowel syndrome-diarrhea predominant The isolated episodic nature of this makes it difficult to treat and I think stopping Viberzi due to the side effects makes sense. Trial of diphenoxylate and atropine as needed episodic diarrhea.  In the past loperamide has been of no benefit she says.  Unfortunately she will not be able to start this proactively as the episodes are unpredictable.  If this is unhelpful will consider ondansetron which might be useful as it is anecdotally helpful in diarrhea predominant IBS.  Other options would be dicyclomine and perhaps as needed alosetron.  Liver cirrhosis secondary to NASH [facility]) This is stable and followed by Dr. Loletha Grayer at [facility].  Much improved compared to the time of diagnosis in 2019.    I appreciate the opportunity to care for this patient. CC: [name], NP Dr. Enis Gash [facility]  Subjective:   Chief Complaint: IBS  HPI 64 year old white woman with liver cirrhosis secondary to [facility] and also has diarrhea predominant IBS for many years, had been on Viberzi but she gets abdominal discomfort or even upper back pain when she has to take that.  She had been cleared to use that as needed despite her cirrhosis by Dr. Loletha Grayer but the problem is the side effects.  She gets diarrhea episodes a few times a month with sudden urgent watery stool with out obvious trigger or prediction.  This is really been a pattern for a long time.  Unfortunately she has to wear adult diapers because even though she works from home right next to the bathroom she may not make it to the toilet in time.  She is looking for some other treatment that might help.  When she does get the episodic diarrhea she will have about 3 days of this and feel wiped out.  She has some fatigue still and some dyspnea.  Liver disease has been stable overall.  I had last seen her in March due to some  epigastric pain and elevated lipase.  An EGD demonstrated known esophageal varices grade 2 maximum size 6 mm, and portal gastropathy.  A CT scan was done as wellWith small volume pelvic ascites, cirrhosis without mass lesions, no gallbladder or biliary issues. Wt Readings from Last 3 Encounters:  06/04/20 187 lb 8 oz (85 kg)  05/01/20 186 lb 8 oz (84.6 kg)  02/29/20 193 lb (87.5 kg)   She does take lactulose a few times a month when she has a falloff in her bowel movements and is on Xifaxan for her hepatic encephalopathy.  The diarrhea episodes are not related to lactulose use however.  She only takes about a tablespoon once a day 3 times a month.  Nausea off-and-on is a problem not as bad as before Phenergan helps ondansetron ondansetron did not provide much help in the past.  Omeprazole may help this as well.  No Known Allergies Current Meds  Medication Sig  . Butalbital-APAP-Caffeine 50-300-40 MG CAPS Take 1 tablet by mouth every 8 (eight) hours as needed (migraine).  . furosemide (LASIX) 40 MG tablet TAKE 1 TABLET DAILY  . nadolol (CORGARD) 20 MG tablet Take 0.5-1 tablets (10-20 mg total) by mouth daily as needed.  Marland Kitchen omeprazole (PRILOSEC) 40 MG capsule Take by mouth daily.  . promethazine (PHENERGAN) 25 MG tablet Take 1 tablet (25 mg total) by mouth every 6 (six) hours as needed for nausea or vomiting.  . rifaximin Doreene Nest)  550 MG TABS tablet Take 1 tablet (550 mg total) by mouth 2 (two) times daily.  Marland Kitchen spironolactone (ALDACTONE) 100 MG tablet TAKE 1 TABLET DAILY   Past Medical History:  Diagnosis Date  . Ascites   . Encephalopathy, hepatic ([place]) 05/13/2018  . GERD (gastroesophageal reflux disease)   . History of exercise stress test    03-07-2010  Stress echo--- no arrhythmias or conduction abnormalilites and negative for ischemia or chest pain  . History of kidney stones   . History of paroxysmal supraventricular tachycardia    episode 2011  consult w/ dr Caryl Comes --  put on  atenolol--  per pt no longer an issue  . HTN (hypertension)    cardiologist --  dr Stanford Breed  . IBS (irritable bowel syndrome)    diarrhea  . Kidney stones    kidney infections as well  . Nonalcoholic steatohepatitis (NASH)   . Numbness and tingling of left lower extremity    post achilles tendon repair  . OA (osteoarthritis)    knees  . Pleural effusion on right    hepatic hydrothorax  . PONV (postoperative nausea and vomiting)   . Recurrent UTI 06/12/2019  . Secondary esophageal varices without bleeding ([place]) 06/03/2018  . Vitamin D deficiency   . Wears glasses    Past Surgical History:  Procedure Laterality Date  . ACHILLES TENDON SURGERY Left 06/2016  . BREAST BIOPSY Left 02/2016   benign  . COLONOSCOPY  2005  . CT CTA CORONARY W/CA SCORE W/CM &/OR WO/CM  01/01/2014   non-obstructive calcified plaque in pLAD (0-25%), no significant incidental noncardiac findings noted  . ESOPHAGOGASTRODUODENOSCOPY  01/2014  . EXTRACORPOREAL SHOCK WAVE LITHOTRIPSY  2010  . KNEE ARTHROPLASTY Right 03/24/2018   Procedure: RIGHT TOTAL KNEE ARTHROPLASTY WITH COMPUTER NAVIGATION;  Surgeon: Rod Can, MD;  [facility];  Service: Orthopedics;  Laterality: Right;  Needs RNFA  . KNEE ARTHROSCOPY Right 12/04/2016   Procedure: ARTHROSCOPY KNEE WITH PARTIAL MEDIAL MENISCECTOMY;  Surgeon: Rod Can, MD;  Location: [place];  Service: Orthopedics;  Laterality: Right;  . LEFT HEART CATHETERIZATION WITH CORONARY ANGIOGRAM N/A 01/11/2012   Procedure: LEFT HEART CATHETERIZATION WITH CORONARY ANGIOGRAM;  Surgeon: Sherren Mocha, MD;  Location: [facility] C[facility] LAB;  Service: Cardiovascular;  Laterality: N/A;  widely patent coronary arterires without significant obstructive CAD,  normal LVF, ef 55-56%  . TONSILLECTOMY AND ADENOIDECTOMY  child  . TRANSTHORACIC ECHOCARDIOGRAM  01/18/2012   mild LVH,  ef 60%/  trivial TR  . TUBAL LIGATION  1985  . UPPER GASTROINTESTINAL ENDOSCOPY     Social  History   Social History Narrative   Married, RN case [employer]   Has daughters - one is [place] EP NP ([place])    rare EtOH, never smoker, no drugs   Does not routinely exercise.  Has to walk up stairs to work and can do w/o Ss.  Lives @ home in [place] with family.   family history includes Arthritis in her sister; Celiac disease in her daughter; Coronary artery disease in her father and mother; Diabetes in her father; Healthy in her brother; Hypertension in her sister; IgA nephropathy in her daughter; Lymphoma in her mother.   Review of Systems As per HPI  Objective:   Physical Exam BP 124/62   Pulse 72   Ht 5' 3.5" (1.613 m)   Wt 187 lb 8 oz (85 kg)   BMI 32.69 kg/m  NAD Obese Lungs cta  Cor NL abd obese soft NT no obvious ascites 1+ LE edema

## 2020-06-04 NOTE — Assessment & Plan Note (Addendum)
The isolated episodic nature of this makes it difficult to treat and I think stopping Viberzi due to the side effects makes sense. Trial of diphenoxylate and atropine as needed episodic diarrhea.  In the past loperamide has been of no benefit she says.  Unfortunately she will not be able to start this proactively as the episodes are unpredictable.  If this is unhelpful will consider ondansetron which might be useful as it is anecdotally helpful in diarrhea predominant IBS.  Other options would be dicyclomine and perhaps as needed alosetron.

## 2020-06-04 NOTE — Assessment & Plan Note (Signed)
This is stable and followed by Dr. Loletha Grayer at [facility].  Much improved compared to the time of diagnosis in 2019.

## 2020-06-13 ENCOUNTER — Encounter (HOSPITAL_BASED_OUTPATIENT_CLINIC_OR_DEPARTMENT_OTHER): Payer: Self-pay | Admitting: Emergency Medicine

## 2020-06-13 ENCOUNTER — Other Ambulatory Visit: Payer: Self-pay

## 2020-06-13 ENCOUNTER — Emergency Department (HOSPITAL_BASED_OUTPATIENT_CLINIC_OR_DEPARTMENT_OTHER)
Admission: EM | Admit: 2020-06-13 | Discharge: 2020-06-13 | Disposition: A | Discharge status: Home / Self Care | Payer: 59 | Attending: Emergency Medicine | Admitting: Emergency Medicine

## 2020-06-13 DIAGNOSIS — Z79899 Other long term (current) drug therapy: Secondary | ICD-10-CM | POA: Diagnosis not present

## 2020-06-13 DIAGNOSIS — R9431 Abnormal electrocardiogram [ECG] [EKG]: Secondary | ICD-10-CM | POA: Insufficient documentation

## 2020-06-13 DIAGNOSIS — I10 Essential (primary) hypertension: Secondary | ICD-10-CM | POA: Insufficient documentation

## 2020-06-13 DIAGNOSIS — R531 Weakness: Secondary | ICD-10-CM | POA: Diagnosis not present

## 2020-06-13 LAB — MAGNESIUM: Magnesium: 1.9 mg/dL (ref 1.7–2.4)

## 2020-06-13 LAB — BASIC METABOLIC PANEL
Anion gap: 10 (ref 5–15)
BUN: 14 mg/dL (ref 8–23)
CO2: 22 mmol/L (ref 22–32)
Calcium: 8.8 mg/dL — ABNORMAL LOW (ref 8.9–10.3)
Chloride: 101 mmol/L (ref 98–111)
Creatinine, Ser: 1.37 mg/dL — ABNORMAL HIGH (ref 0.44–1.00)
GFR calc Af Amer: 47 mL/min — ABNORMAL LOW (ref >60)
GFR calc non Af Amer: 41 mL/min — ABNORMAL LOW (ref >60)
Glucose, Bld: 105 mg/dL — ABNORMAL HIGH (ref 70–99)
Potassium: 3.6 mmol/L (ref 3.5–5.1)
Sodium: 133 mmol/L — ABNORMAL LOW (ref 135–145)

## 2020-06-13 LAB — CBC
HCT: 41.4 % (ref 36.0–46.0)
Hemoglobin: 14.1 g/dL (ref 12.0–15.0)
MCH: 30.7 pg (ref 26.0–34.0)
MCHC: 34.1 g/dL (ref 30.0–36.0)
MCV: 90.2 fL (ref 80.0–100.0)
Platelets: 111 10*3/uL — ABNORMAL LOW (ref 150–400)
RBC: 4.59 MIL/uL (ref 3.87–5.11)
RDW: 14.6 % (ref 11.5–15.5)
WBC: 8.6 10*3/uL (ref 4.0–10.5)
nRBC: 0 % (ref 0.0–0.2)

## 2020-06-13 LAB — HEPATIC FUNCTION PANEL
ALT: 24 U/L (ref 0–44)
AST: 49 U/L — ABNORMAL HIGH (ref 15–41)
Albumin: 3.3 g/dL — ABNORMAL LOW (ref 3.5–5.0)
Alkaline Phosphatase: 79 U/L (ref 38–126)
Bilirubin, Direct: 0.5 mg/dL — ABNORMAL HIGH (ref 0.0–0.2)
Indirect Bilirubin: 1.7 mg/dL — ABNORMAL HIGH (ref 0.3–0.9)
Total Bilirubin: 2.2 mg/dL — ABNORMAL HIGH (ref 0.3–1.2)
Total Protein: 7.3 g/dL (ref 6.5–8.1)

## 2020-06-13 LAB — CBG MONITORING, ED: Glucose-Capillary: 93 mg/dL (ref 70–99)

## 2020-06-13 LAB — URINALYSIS, ROUTINE W REFLEX MICROSCOPIC
Bilirubin Urine: NEGATIVE
Glucose, UA: NEGATIVE mg/dL
Hgb urine dipstick: NEGATIVE
Ketones, ur: NEGATIVE mg/dL
Leukocytes,Ua: NEGATIVE
Nitrite: NEGATIVE
Protein, ur: NEGATIVE mg/dL
Specific Gravity, Urine: 1.015 (ref 1.005–1.030)
pH: 7 (ref 5.0–8.0)

## 2020-06-13 LAB — AMMONIA: Ammonia: 29 umol/L (ref 9–35)

## 2020-06-13 MED ORDER — SODIUM CHLORIDE 0.9 % IV BOLUS
500.0000 mL | Freq: Once | INTRAVENOUS | Status: AC
  Administered 2020-06-13: 500 mL via INTRAVENOUS

## 2020-06-13 NOTE — ED Triage Notes (Signed)
Pt c/o generalized weakness since around noon today. States she has been sweaty. denies pain

## 2020-06-13 NOTE — ED Notes (Signed)
ED Provider at bedside. 

## 2020-06-13 NOTE — ED Notes (Signed)
Pt. Ambulated under own power, "felt good and was ready to leave". Pt. Did well with ambulation, no gait issues, no SOB, no dizziness.

## 2020-06-13 NOTE — ED Provider Notes (Signed)
[facility] EMERGENCY DEPARTMENT Provider Note   CSN: [id number] Arrival date & time: 06/13/20  1447     History Chief Complaint  Patient presents with  . Weakness    Dawn Moon is a 64 y.o. female.  Patient is a 64 year old female who presents with generalized weakness.  She has a history of [place], hypertension, GERD, PSVT.  She states that she generally has problems with generalized weakness.  Over the last 2 days has been a little bit worse than it typically is.  Today she had an episode where she got diaphoretic and felt very weak all over.  She had no chest pain or palpitations.  No shortness of breath.  No dizziness.  No headaches.  No abdominal pain.  No nausea or vomiting.  No fevers.  No recent illnesses such as URI symptoms.  No urinary symptoms.  She had some diarrhea about 2 days ago which resolved with some Lomotil.  She has IBS and says that its not uncommon for her to have diarrhea from time to time.  She does not have any unilateral weakness.  No speech deficits or vision changes.        Past Medical History:  Diagnosis Date  . Ascites   . Encephalopathy, hepatic ([place]) 05/13/2018  . GERD (gastroesophageal reflux disease)   . History of exercise stress test    03-07-2010  Stress echo--- no arrhythmias or conduction abnormalilites and negative for ischemia or chest pain  . History of kidney stones   . History of paroxysmal supraventricular tachycardia    episode 2011  consult w/ dr Caryl Comes --  put on atenolol--  per pt no longer an issue  . HTN (hypertension)    cardiologist --  dr Stanford Breed  . IBS (irritable bowel syndrome)    diarrhea  . Kidney stones    kidney infections as well  . Nonalcoholic steatohepatitis (NASH)   . Numbness and tingling of left lower extremity    post achilles tendon repair  . OA (osteoarthritis)    knees  . Pleural effusion on right    hepatic hydrothorax  . PONV (postoperative nausea and vomiting)   . Recurrent  UTI 06/12/2019  . Secondary esophageal varices without bleeding ([place]) 06/03/2018  . Vitamin D deficiency   . Wears glasses     Patient Active Problem List   Diagnosis Date Noted  . Portal hypertensive gastropathy ([place]) 02/29/2020  . Recurrent UTI 06/12/2019  . Secondary insomnia 04/10/2019  . Anemia 02/28/2019  . Thrombocytopenia ([place]) 02/28/2019  . Degeneration of lumbar intervertebral disc 08/06/2018  . Secondary esophageal varices without bleeding ([place]) 06/03/2018  . Encephalopathy, hepatic ([place]) 05/13/2018  . History of paroxysmal supraventricular tachycardia   . Liver cirrhosis secondary to NASH ([place]) 04/23/2018  . Irritable bowel syndrome-diarrhea predominant 06/15/2017  . Gastroesophageal reflux disease 06/15/2017  . Hyperlipidemia   . Kidney stone   . Essential hypertension 02/13/2010    Past Surgical History:  Procedure Laterality Date  . ACHILLES TENDON SURGERY Left 06/2016  . BREAST BIOPSY Left 02/2016   benign  . COLONOSCOPY  2005  . CT CTA CORONARY W/CA SCORE W/CM &/OR WO/CM  01/01/2014   non-obstructive calcified plaque in pLAD (0-25%), no significant incidental noncardiac findings noted  . ESOPHAGOGASTRODUODENOSCOPY  01/2014  . EXTRACORPOREAL SHOCK WAVE LITHOTRIPSY  2010  . KNEE ARTHROPLASTY Right 03/24/2018   Procedure: RIGHT TOTAL KNEE ARTHROPLASTY WITH COMPUTER NAVIGATION;  Surgeon: Rod Can, MD;  Location: [facility];  Service:  Orthopedics;  Laterality: Right;  Needs RNFA  . KNEE ARTHROSCOPY Right 12/04/2016   Procedure: ARTHROSCOPY KNEE WITH PARTIAL MEDIAL MENISCECTOMY;  Surgeon: Rod Can, MD;  Location: [facility];  Service: Orthopedics;  Laterality: Right;  . LEFT HEART CATHETERIZATION WITH CORONARY ANGIOGRAM N/A 01/11/2012   Procedure: LEFT HEART CATHETERIZATION WITH CORONARY ANGIOGRAM;  Surgeon: Sherren Mocha, MD;  Location: [facility] CATH LAB;  Service: Cardiovascular;  Laterality: N/A;  widely patent coronary arterires without  significant obstructive CAD,  normal LVF, ef 55-56%  . TONSILLECTOMY AND ADENOIDECTOMY  child  . TRANSTHORACIC ECHOCARDIOGRAM  01/18/2012   mild LVH,  ef 60%/  trivial TR  . TUBAL LIGATION  1985  . UPPER GASTROINTESTINAL ENDOSCOPY       OB History   No obstetric history on file.     Family History  Problem Relation Age of Onset  . Lymphoma Mother        non-hodgkins - died @ 52  . Coronary artery disease Mother   . Coronary artery disease Father        CABG in his 11s, died @ 63  . Diabetes Father   . Healthy Brother   . Hypertension Sister   . Arthritis Sister   . IgA nephropathy Daughter   . Celiac disease Daughter   . Colon cancer Neg Hx   . Esophageal cancer Neg Hx   . Stomach cancer Neg Hx   . Rectal cancer Neg Hx     Social History   Tobacco Use  . Smoking status: Never Smoker  . Smokeless tobacco: Never Used  Vaping Use  . Vaping Use: Never used  Substance Use Topics  . Alcohol use: Not Currently  . Drug use: No    Home Medications Prior to Admission medications   Medication Sig Start Date End Date Taking? Authorizing Provider  Butalbital-APAP-Caffeine 50-300-40 MG CAPS Take 1 tablet by mouth every 8 (eight) hours as needed (migraine). 04/10/19   Leamon Arnt, MD  diphenoxylate-atropine (LOMOTIL) 2.5-0.025 MG tablet 1-2 tabs every 6 hours as needed 06/04/20   Gatha Mayer, MD  furosemide (LASIX) 40 MG tablet TAKE 1 TABLET DAILY 12/01/19   Leamon Arnt, MD  lactulose [facility]) 10 GM/15ML solution Take 10 g by mouth daily as needed.    [provider]  nadolol (CORGARD) 20 MG tablet Take 0.5-1 tablets (10-20 mg total) by mouth daily as needed. 11/02/19   Leamon Arnt, MD  omeprazole (PRILOSEC) 40 MG capsule Take by mouth daily. 02/14/20 02/13/21  [provider]  promethazine (PHENERGAN) 25 MG tablet Take 1 tablet (25 mg total) by mouth every 6 (six) hours as needed for nausea or vomiting. 09/16/18   Leamon Arnt, MD  rifaximin  (XIFAXAN) 550 MG TABS tablet Take 1 tablet (550 mg total) by mouth 2 (two) times daily. 05/25/18   Zehr, Janett Billow D, PA-C  spironolactone (ALDACTONE) 100 MG tablet TAKE 1 TABLET DAILY 12/01/19   Leamon Arnt, MD    Allergies    Patient has no known allergies.  Review of Systems   Review of Systems  Constitutional: Positive for diaphoresis and fatigue. Negative for chills and fever.  HENT: Negative for congestion, rhinorrhea and sneezing.   Eyes: Negative.   Respiratory: Negative for cough, chest tightness and shortness of breath.   Cardiovascular: Negative for chest pain and leg swelling.  Gastrointestinal: Negative for abdominal pain, blood in stool, diarrhea, nausea and vomiting.  Genitourinary: Negative for difficulty urinating,  flank pain, frequency and hematuria.  Musculoskeletal: Negative for arthralgias and back pain.  Skin: Negative for rash.  Neurological: Positive for weakness (Generalized). Negative for dizziness, speech difficulty, numbness and headaches.    Physical Exam Updated Vital Signs BP 104/66 (BP Location: Right Arm)   Pulse 62   Temp 98 F (36.7 C) (Oral)   Resp 14   Ht 5' 3.5" (1.613 m)   Wt 83.5 kg   SpO2 97%   BMI 32.08 kg/m   Physical Exam Constitutional:      Appearance: She is well-developed.  HENT:     Head: Normocephalic and atraumatic.  Eyes:     Pupils: Pupils are equal, round, and reactive to light.  Cardiovascular:     Rate and Rhythm: Normal rate and regular rhythm.     Heart sounds: Normal heart sounds.  Pulmonary:     Effort: Pulmonary effort is normal. No respiratory distress.     Breath sounds: Normal breath sounds. No wheezing or rales.  Chest:     Chest wall: No tenderness.  Abdominal:     General: Bowel sounds are normal.     Palpations: Abdomen is soft.     Tenderness: There is no abdominal tenderness. There is no guarding or rebound.  Musculoskeletal:        General: Normal range of motion.     Cervical back: Normal  range of motion and neck supple.  Lymphadenopathy:     Cervical: No cervical adenopathy.  Skin:    General: Skin is warm and dry.     Findings: No rash.  Neurological:     General: No focal deficit present.     Mental Status: She is alert and oriented to person, place, and time.     ED Results / Procedures / Treatments   Labs (all labs ordered are listed, but only abnormal results are displayed) Labs Reviewed  BASIC METABOLIC PANEL - Abnormal; Notable for the following components:      Result Value   Sodium 133 (*)    Glucose, Bld 105 (*)    Creatinine, Ser 1.37 (*)    Calcium 8.8 (*)    GFR calc non Af Amer 41 (*)    GFR calc Af Amer 47 (*)    All other components within normal limits  CBC - Abnormal; Notable for the following components:   Platelets 111 (*)    All other components within normal limits  HEPATIC FUNCTION PANEL - Abnormal; Notable for the following components:   Albumin 3.3 (*)    AST 49 (*)    Total Bilirubin 2.2 (*)    Bilirubin, Direct 0.5 (*)    Indirect Bilirubin 1.7 (*)    All other components within normal limits  URINALYSIS, ROUTINE W REFLEX MICROSCOPIC  MAGNESIUM  AMMONIA  CBG MONITORING, ED    EKG EKG Interpretation  Date/Time:  Thursday June 13 2020 15:00:48 EDT Ventricular Rate:  61 PR Interval:  130 QRS Duration: 90 QT Interval:  502 QTC Calculation: 505 R Axis:   10 Text Interpretation: Normal sinus rhythm Prolonged QT Abnormal ECG Confirmed by Malvin Johns [phone]) on 06/13/2020 3:14:15 PM   Radiology No results found.  Procedures Procedures (including critical care time)  Medications Ordered in ED Medications  sodium chloride 0.9 % bolus 500 mL (0 mLs Intravenous Stopped 06/13/20 1759)    ED Course  I have reviewed the triage vital signs and the nursing notes.  Pertinent labs & imaging results that were  available during my care of the patient were reviewed by me and considered in my medical decision making (see chart  for details).    MDM Rules/Calculators/A&P                          Patient is a 64 year old female who presents with generalized weakness. She has chronic generalized weakness but is a little worse than her baseline. No focal deficits. Her labs are nonconcerning. She has no anemia. Her electrolytes are okay. Her creatinine is similar to baseline. There is no suggestions of infection. No evidence of a UTI. She is feeling better after some IV fluids. She is able to ambulate without difficulty. She says her symptoms have improved and she is ready to go home.  She did have some prolongation of her QT interval which appears to be new. I reviewed her medications as well as the pharmacist and it does not appear that she is on any medications that would prolong her QT interval. She does have a history of nonsustained runs of V. tach. Given this, I spoke with Dr. Oval Linsey with cardiology who does not feel that there is anything that we need to do about this today and is not overly concerned about the risk for V. tach. Her magnesium level is normal. She was discharged home in good condition. She will follow-up with her PCP. Did discuss touching base with Dr. Caryl Comes, her cardiologist regarding the prolonged QT interval and also to let her doctors know that she needs to avoid any medications that would prolong her QT interval. Final Clinical Impression(s) / ED Diagnoses Final diagnoses:  Weakness  Prolonged Q-T interval on ECG    Rx / DC Orders ED Discharge Orders    None       Malvin Johns, MD 06/13/20 1956

## 2020-06-13 NOTE — ED Notes (Signed)
CBG done by someone else

## 2020-06-14 DIAGNOSIS — E876 Hypokalemia: Secondary | ICD-10-CM

## 2020-06-14 MED ORDER — POTASSIUM CHLORIDE CRYS ER 20 MEQ PO TBCR
20.0000 meq | EXTENDED_RELEASE_TABLET | Freq: Every day | ORAL | 3 refills | Status: DC
Start: 2020-06-14 — End: 2020-07-01

## 2020-06-14 NOTE — Telephone Encounter (Signed)
Long QT can be associated with liver disease; she does not appear to be on any meds that would prolong QT; K 3.6 and mg 1.9; would add Kdur 20 meq daily; bmet one week; schedule APPov 2 weeks to repeat ECG; if no h/o syncope, no other therapy indicated.  Kirk Ruths   Spoke with pt, Aware of dr Jacalyn Lefevre recommendations. She has potassium from previous at home that she will use. Lab orders mailed to the pt and Follow up scheduled.

## 2020-06-23 NOTE — Progress Notes (Signed)
Cardiology Office Note:    Date:  06/28/2020   ID:  Lelon Huh, DOB 02-21-1956, MRN [id number]  PCP:  [name], NP  Cardiologist:  Kirk Ruths, MD  Electrophysiologist:  None   Referring MD: [name], NP   Chief Complaint: follow-up of prolonged QT  History of Present Illness:    Dawn Moon is a 64 y.o. female with a history of non-obstructive CAD on cardiac cath in 2013 and cardiac CT in 2015, palpitations with brief atrial tachycardia and non-sustained VT noted on CardioNet in 2011, hypertension, obstructive sleep apnea, IBS, cirrhosis secondary to NASH, esophageal varices, and recurrent UTI who is followed by Dr. Stanford Breed and presents today for follow-up of prolonged QT after recent ED visit for generalized weakness.   Patient has a history of palpitation. CardioNet in 2011 showed 4 beat of non-sustained VT and brief episodes of atrial tachycardia as well as sinus tachycardia. Patient was seen by Dr. Caryl Comes, and she was started on Verapamil. Her symptoms did not improve and she as changed to a beta blocker. Her symptoms improved with Atenolol. She underwent a cardiac catheterization in 2013 which showed no obstructive CAD. EF was 55-65%. She then had a cardiac CT in 12/2013 which showed non-obstructive plaque in the proximal LAD. Patient was admitted in 03/2018 and underwent right knee replacement. Following discharge, she developed worsening dyspnea. Echo in 04/2018 showed LVEF of 60-65% with normal wall motion and mildly dilated left atrium. Chest CTA showed was negative for PE but did show a large right pleural effusion and lobulated liver compatible with cirrhosis. She also had ascites and therefore underwent right thoracentesis and paracentesis. She was diagnosed with cirrhosis secondary to NASH. She was diuresed and discharged on high dose of diuretics. She was then readmitted with confusion and diagnosed with hepatic encephalopathy and acute renal  insufficiency. She was treated with lactulose and Xifaxan. Diuretics were reduced. She has been followed by GI as an outpatient and has been referred to [facility] for consideration of transplant. Of note, she was found to have 2 esophageal varices on EGD in 05/2018. She was last seen by Jory Sims, NP, for a virtual visit in 04/2020 at which time she denied and cardiac symptoms but did note chronic fatigue.   Since her last visit, she was seen in the ED on 06/13/2020 for generalized weakness. Lab work was unremarkable. Ammonium normal. Urinalysis normal. Hemoglobin normal. No signs of infection. EKG showed prolonged QTc of 505 ms which was new. Potassium 3.6 and magnesium 1.9. Patient was given IV fluids with improvement of symptoms; therefore, she was felt to be stable for discharge. Dr. Stanford Breed reviewed ED visit and recommended starting K-Dur 20 mEq and follow-up visit for repeat EKG.   Patient presents today for follow-up. Discussed above episode preceding ED visit. She states she felt incredibly weak (much worse then yesterday) and was diaphoretic. No chest pain or shortness of breath. No significant palpitations. She states she felt so bad she thought she may pass out. After discussing this with her, it is unclear whether this was near syncope or patient was just profoundly weak. No overt syncope. No recurrence of symptoms since discharge. Unclear what caused these symptoms. QTc improved on EKG today at 447 ms. Tolerating potassium well but states pills are a little hard to swallow. No orthopnea or PND. Chronic lower extremity edema stable. She has frequent nausea and some diarrhea due to her NASH. She is followed at [facility] but states her  labs are not bad enough yet for her to be on the transplant list.   Past Medical History:  Diagnosis Date  . Ascites   . Encephalopathy, hepatic ([place]) 05/13/2018  . GERD (gastroesophageal reflux disease)   . History of exercise stress test    03-07-2010  Stress echo---  no arrhythmias or conduction abnormalilites and negative for ischemia or chest pain  . History of kidney stones   . History of paroxysmal supraventricular tachycardia    episode 2011  consult w/ dr Caryl Comes --  put on atenolol--  per pt no longer an issue  . HTN (hypertension)    cardiologist --  dr Stanford Breed  . IBS (irritable bowel syndrome)    diarrhea  . Kidney stones    kidney infections as well  . Nonalcoholic steatohepatitis (NASH)   . Numbness and tingling of left lower extremity    post achilles tendon repair  . OA (osteoarthritis)    knees  . Pleural effusion on right    hepatic hydrothorax  . PONV (postoperative nausea and vomiting)   . Recurrent UTI 06/12/2019  . Secondary esophageal varices without bleeding ([place]) 06/03/2018  . Vitamin D deficiency   . Wears glasses     Past Surgical History:  Procedure Laterality Date  . ACHILLES TENDON SURGERY Left 06/2016  . BREAST BIOPSY Left 02/2016   benign  . COLONOSCOPY  2005  . CT CTA CORONARY W/CA SCORE W/CM &/OR WO/CM  01/01/2014   non-obstructive calcified plaque in pLAD (0-25%), no significant incidental noncardiac findings noted  . ESOPHAGOGASTRODUODENOSCOPY  01/2014  . EXTRACORPOREAL SHOCK WAVE LITHOTRIPSY  2010  . KNEE ARTHROPLASTY Right 03/24/2018   Procedure: RIGHT TOTAL KNEE ARTHROPLASTY WITH COMPUTER NAVIGATION;  Surgeon: Rod Can, MD;  Location: [facility];  Service: Orthopedics;  Laterality: Right;  Needs RNFA  . KNEE ARTHROSCOPY Right 12/04/2016   Procedure: ARTHROSCOPY KNEE WITH PARTIAL MEDIAL MENISCECTOMY;  Surgeon: Rod Can, MD;  Location: [place];  Service: Orthopedics;  Laterality: Right;  . LEFT HEART CATHETERIZATION WITH CORONARY ANGIOGRAM N/A 01/11/2012   Procedure: LEFT HEART CATHETERIZATION WITH CORONARY ANGIOGRAM;  Surgeon: Sherren Mocha, MD;  Location: [facility] CATH LAB;  Service: Cardiovascular;  Laterality: N/A;  widely patent coronary arterires without significant obstructive CAD,   normal LVF, ef 55-56%  . TONSILLECTOMY AND ADENOIDECTOMY  child  . TRANSTHORACIC ECHOCARDIOGRAM  01/18/2012   mild LVH,  ef 60%/  trivial TR  . TUBAL LIGATION  1985  . UPPER GASTROINTESTINAL ENDOSCOPY      Current Medications: Current Meds  Medication Sig  . Butalbital-APAP-Caffeine 50-300-40 MG CAPS Take 1 tablet by mouth every 8 (eight) hours as needed (migraine).  . diphenoxylate-atropine (LOMOTIL) 2.5-0.025 MG tablet 1-2 tabs every 6 hours as needed  . furosemide (LASIX) 40 MG tablet TAKE 1 TABLET DAILY  . lactulose (CHRONULAC) 10 GM/15ML solution Take 10 g by mouth daily as needed.  . nadolol (CORGARD) 20 MG tablet Take 0.5-1 tablets (10-20 mg total) by mouth daily as needed.  Marland Kitchen omeprazole (PRILOSEC) 20 MG capsule Take 20 mg by mouth daily.  Marland Kitchen omeprazole (PRILOSEC) 40 MG capsule Take by mouth daily.  . potassium chloride SA (KLOR-CON) 20 MEQ tablet Take 1 tablet (20 mEq total) by mouth daily.  . promethazine (PHENERGAN) 25 MG tablet Take 1 tablet (25 mg total) by mouth every 6 (six) hours as needed for nausea or vomiting.  . rifaximin (XIFAXAN) 550 MG TABS tablet Take 1 tablet (550 mg total) by mouth  2 (two) times daily.  Marland Kitchen spironolactone (ALDACTONE) 100 MG tablet TAKE 1 TABLET DAILY     Allergies:   Patient has no known allergies.   Social History   Socioeconomic History  . Marital status: Married    Spouse name: Not on file  . Number of children: Not on file  . Years of education: Not on file  . Highest education level: Not on file  Occupational History  . 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 Smoking status: Never Smoker  . Smokeless tobacco: Never Used  Vaping Use  . Vaping Use: Never used  Substance and Sexual Activity  . Alcohol use: Not Currently  . Drug use: No  . Sexual activity: Not on file  Other Topics Concern  . 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  Has to walk up stairs to work and can do w/o Ss.  Lives @ home in [place] with family.   Social Determinants of Health   Financial Resource Strain:   . Difficulty of Paying Living Expenses:   Food Insecurity:   . Worried About Charity fundraiser in the Last Year:   . Arboriculturist in the Last Year:   Transportation Needs:   . Film/video editor (Medical):   Marland Kitchen Lack of Transportation (Non-Medical):   Physical Activity:   . Days of Exercise per Week:   . Minutes of Exercise per Session:   Stress:   . Feeling of Stress :   Social Connections:   . Frequency of Communication with Friends and Family:   . Frequency of Social Gatherings with Friends and Family:   . Attends Religious Services:   . Active Member of Clubs or Organizations:   . Attends Archivist Meetings:   Marland Kitchen Marital Status:      Family History: The patient's family history includes Arthritis in her sister; Celiac disease in her daughter; Coronary artery disease in her father and mother; Diabetes in her father; Healthy in her brother; Hypertension in her sister; IgA nephropathy in her daughter; Lymphoma in her mother. There is no history of Colon cancer, Esophageal cancer, Stomach cancer, or Rectal cancer.  ROS:   Please see the history of present illness.     EKGs/Labs/Other Studies Reviewed:    The following studies were reviewed today:  Coronary CTA 01/01/2014: Impression: Non-obstructive calcified plague in proximal LAD. Medical management  is recommended. _______________  Echocardiogram 04/25/2018: Study Conclusions: - Left ventricle: The cavity size was normal. Wall thickness was  normal. Systolic function was normal. The estimated ejection  fraction was in the range of 60% to 65%. Wall motion was normal;  there were no regional wall motion abnormalities. There was no    evidence of elevated ventricular filling pressure by Doppler  parameters.  - Left atrium: The atrium was mildly dilated.   EKG:  EKG ordered today. EKG personally reviewed and demonstrates Normal sinus rhythm (autmatically read as accelerated junctional rhythm but there are low voltage P waves). Rate 73 bpm. QTc 447 ms. No acute ischemic changes compared to prior tracings.  Recent Labs: 09/05/2019: TSH 1.93 06/13/2020: ALT 24; BUN 14; Creatinine, Ser 1.37; Hemoglobin 14.1; Magnesium 1.9; Platelets 111; Potassium 3.6; Sodium  133  Recent Lipid Panel    Component Value Date/Time   CHOL 252 (H) 09/05/2019 0925   CHOL 200 (H) 06/15/2017 0955   TRIG 223.0 (H) 09/05/2019 0925   HDL 54.70 09/05/2019 0925   HDL 50 06/15/2017 0955   CHOLHDL 5 09/05/2019 0925   VLDL 44.6 (H) 09/05/2019 0925   LDLCALC 122 (H) 08/25/2018 0850   LDLCALC 112 (H) 06/15/2017 0955   LDLDIRECT 66.0 09/05/2019 0925    Physical Exam:    Vital Signs: BP 128/80   Pulse 73   Temp (!) 96.8 F (36 C)   Ht 5' 3.5" (1.613 m)   Wt 185 lb (83.9 kg)   SpO2 99%   BMI 32.26 kg/m     Wt Readings from Last 3 Encounters:  06/28/20 185 lb (83.9 kg)  06/13/20 184 lb (83.5 kg)  06/04/20 187 lb 8 oz (85 kg)     General: 64 y.o. female in no acute distress. HEENT: Normocephalic and atraumatic. Sclera clear.  Neck: Supple. No carotid bruits. No JVD. Heart: RRR. Distinct S1 and S2. No murmurs, gallops, or rubs. Radial pulses 2+ and equal bilaterally. Lungs: No increased work of breathing. Clear to ausculation bilaterally. No wheezes, rhonchi, or rales.  Abdomen: Soft, non-distended, and non-tender to palpation. Bowel sounds present in all 4 quadrants.  Extremities: Chronic bilateral lower extremity edema.  Skin: Warm and dry. Neuro: Alert and oriented x3. No focal deficits. Psych: Normal affect. Responds appropriately.   Assessment:    1. Prolonged QT interval   2. Palpitations   3. Essential hypertension   4.  Hepatic cirrhosis, unspecified hepatic cirrhosis type, unspecified whether ascites present ([place])   5. Medication management   6. NASH (nonalcoholic steatohepatitis)     Plan:    Prolonged QT - EKG on 06/13/2020 in ED showed QTc of 529m. Potassium was 3.6 and Magnesium was 1.9. - Started on K-Dur 20 mEq daily. Tolerating well but difficult to swallow.  -  Repeat EKG today showed normal sinus rhythm, 73 bpm, with no acute ST/T changes. QTc 447 ms.  - Will recheck BMET today. If potassium stable, will continue potassium supplement but switch to 2 tablets of potassium citrate 10 mEq tablets daily which patient states she has been on before and are easier for her to swallow.  - Patient to let [place] if she has any near syncope/syncope.  Palpitations - CardioNet in 2011 showed short runs of non-sustained VT and brief episodes of atrial tachycardia as well as sinus tachycardia.  - Symptoms well controlled.  - Continue Nadolol.  Hypertension - BP well controlled.  - Continue Nadolol and Spironolactone.   Cirrhosis Secondary to NASH - [facility]otic [facility] with evidence of portal hypertension but no evidence of hepatocellular carcinoma.  [facility] with unchanged recanalized paraumbilical vein.  - On Rifaximin at home.  - Continue Lasix 431mdaily. - Followed closely by GI.   Disposition: Follow up in 4-6 months with Dr. CrStanford Breed  Medication Adjustments/Labs and Tests Ordered: Current medicines are reviewed at length with the patient today.  Concerns regarding medicines are outlined above.  Orders Placed This Encounter  Procedures  . Basic Metabolic Panel (BMET)  . EKG 12-Lead   No orders of the defined types were placed in this encounter.   Patient Instructions  Medication Instructions:  Continue current medications  *If you need a refill on your cardiac medications before your  next appointment, please call  your pharmacy*   Lab Work: BMP Today  If you have labs (blood work) drawn today and your tests are completely normal, you will receive your results only by: Marland Kitchen MyChart Message (if you have MyChart) OR . A paper copy in the mail If you have any lab test that is abnormal or we need to change your treatment, we will call you to review the results.   Testing/Procedures: None Ordered   Follow-Up: At [facility], you and your health needs are our priority.  As part of our continuing mission to provide you with exceptional heart care, we have created designated Provider Care Teams.  These Care Teams include your primary Cardiologist (physician) and Advanced Practice Providers (APPs -  Physician Assistants and Nurse Practitioners) who all work together to provide you with the care you need, when you need it.  We recommend signing up for the patient portal called "MyChart".  Sign up information is provided on this After Visit Summary.  MyChart is used to connect with patients for Virtual Visits (Telemedicine).  Patients are able to view lab/test results, encounter notes, upcoming appointments, etc.  Non-urgent messages can be sent to your provider as well.   To learn more about what you can do with MyChart, go to [email or web address].    Your next appointment:   4-6 month(s)  The format for your next appointment:   In Person  Provider:   You may see Kirk Ruths, MD or one of the following Advanced Practice Providers on your designated Care Team:    Kerin Ransom, PA-C  [place], [place]  Coletta Memos, FNP        Signed, Darreld Mclean, [place]  06/28/2020 1:20 PM    [place]

## 2020-06-28 ENCOUNTER — Ambulatory Visit (INDEPENDENT_AMBULATORY_CARE_PROVIDER_SITE_OTHER): Payer: 59 | Admitting: Student

## 2020-06-28 ENCOUNTER — Other Ambulatory Visit: Payer: Self-pay

## 2020-06-28 ENCOUNTER — Encounter: Payer: Self-pay | Admitting: Student

## 2020-06-28 VITALS — BP 128/80 | HR 73 | Temp 96.8°F | Ht 63.5 in | Wt 185.0 lb

## 2020-06-28 DIAGNOSIS — K7460 Unspecified cirrhosis of liver: Secondary | ICD-10-CM

## 2020-06-28 DIAGNOSIS — K7581 Nonalcoholic steatohepatitis (NASH): Secondary | ICD-10-CM

## 2020-06-28 DIAGNOSIS — I10 Essential (primary) hypertension: Secondary | ICD-10-CM | POA: Diagnosis not present

## 2020-06-28 DIAGNOSIS — R002 Palpitations: Secondary | ICD-10-CM | POA: Diagnosis not present

## 2020-06-28 DIAGNOSIS — R9431 Abnormal electrocardiogram [ECG] [EKG]: Secondary | ICD-10-CM

## 2020-06-28 DIAGNOSIS — Z79899 Other long term (current) drug therapy: Secondary | ICD-10-CM

## 2020-06-28 NOTE — Patient Instructions (Signed)
Medication Instructions:  Continue current medications  *If you need a refill on your cardiac medications before your next appointment, please call your pharmacy*   Lab Work: BMP Today  If you have labs (blood work) drawn today and your tests are completely normal, you will receive your results only by: Marland Kitchen MyChart Message (if you have MyChart) OR . A paper copy in the mail If you have any lab test that is abnormal or we need to change your treatment, we will call you to review the results.   Testing/Procedures: None Ordered   Follow-Up: At [facility], you and your health needs are our priority.  As part of our continuing mission to provide you with exceptional heart care, we have created designated Provider Care Teams.  These Care Teams include your primary Cardiologist (physician) and Advanced Practice Providers (APPs -  Physician Assistants and Nurse Practitioners) who all work together to provide you with the care you need, when you need it.  We recommend signing up for the patient portal called "MyChart".  Sign up information is provided on this After Visit Summary.  MyChart is used to connect with patients for Virtual Visits (Telemedicine).  Patients are able to view lab/test results, encounter notes, upcoming appointments, etc.  Non-urgent messages can be sent to your provider as well.   To learn more about what you can do with MyChart, go to [email or web address].    Your next appointment:   4-6 month(s)  The format for your next appointment:   In Person  Provider:   You may see Kirk Ruths, MD or one of the following Advanced Practice Providers on your designated Care Team:    Kerin Ransom, PA-C  [place], [place]  Coletta Memos, [place]

## 2020-06-29 LAB — BASIC METABOLIC PANEL
BUN/Creatinine Ratio: 9 — ABNORMAL LOW (ref 12–28)
BUN: 14 mg/dL (ref 8–27)
CO2: 21 mmol/L (ref 20–29)
Calcium: 9.5 mg/dL (ref 8.7–10.3)
Chloride: 104 mmol/L (ref 96–106)
Creatinine, Ser: 1.59 mg/dL — ABNORMAL HIGH (ref 0.57–1.00)
GFR calc Af Amer: 40 mL/min/{1.73_m2} — ABNORMAL LOW (ref >59)
GFR calc non Af Amer: 34 mL/min/{1.73_m2} — ABNORMAL LOW (ref >59)
Glucose: 91 mg/dL (ref 65–99)
Potassium: 4.6 mmol/L (ref 3.5–5.2)
Sodium: 138 mmol/L (ref 134–144)

## 2020-07-01 MED ORDER — POTASSIUM CHLORIDE CRYS ER 20 MEQ PO TBCR: 20.0000 meq | EXTENDED_RELEASE_TABLET | Freq: Every day | ORAL | 3 refills | Status: DC

## 2020-07-22 ENCOUNTER — Other Ambulatory Visit: Payer: Self-pay

## 2020-07-22 ENCOUNTER — Other Ambulatory Visit: Payer: Self-pay | Admitting: Family Medicine

## 2020-07-22 ENCOUNTER — Ambulatory Visit
Admission: RE | Admit: 2020-07-22 | Discharge: 2020-07-22 | Disposition: A | Discharge status: Home / Self Care | Payer: 59 | Source: Ambulatory Visit | Attending: Family Medicine | Admitting: Family Medicine

## 2020-07-22 DIAGNOSIS — R921 Mammographic calcification found on diagnostic imaging of breast: Secondary | ICD-10-CM

## 2020-08-29 NOTE — Telephone Encounter (Signed)
I spoke with Dawn Moon and she said she saw her PCP when this started at the end of August and they did a COVID test which was Negative. Her cough is dry and the cough medicine they gave does not help. Reports no fever, just fatigue and abdominal tightness. She put another call into her PCP today and hopefully they will call her back tonight. I told her I will touch base in the AM to see if they called her to advise any different treatment.

## 2020-08-30 NOTE — Telephone Encounter (Signed)
I spoke with Dawn Moon and the PCP office called her back last night and they are going to see her on Monday (her BD).

## 2020-09-02 NOTE — Telephone Encounter (Signed)
Spoke to her about rt pl effusion  Waiting on other labs and an [place] appt  Sounds like she will need paracentesis vs thoracentesis and adjust diuretics but need to see renal fx

## 2020-09-03 ENCOUNTER — Telehealth: Payer: Self-pay | Admitting: Internal Medicine

## 2020-09-03 DIAGNOSIS — J90 Pleural effusion, not elsewhere classified: Secondary | ICD-10-CM

## 2020-09-03 NOTE — Telephone Encounter (Signed)
Had chest x-ray showing right pleural effusion yesterday abdominal ultrasound shows changes of cirrhosis and small amount of pelvic ascites with a 5+ centimeter cystic lesion of unclear significance there.  Her kidney function is back to normal.  She has texted me this information its not all yet in care everywhere.  My current plan is for her to increase spironolactone to 150 mg daily and furosemide 80 mg daily.  I will recheck a be met and a PA and lateral chest x-ray and decubitus on Friday this week that is in 3 days  She believes her BNP was okay.  We will follow up and recheck these labs we may populate in care everywhere.  Plan to discuss with her cardiologist Dr. Stanford Breed also

## 2020-09-04 ENCOUNTER — Telehealth: Payer: Self-pay | Admitting: *Deleted

## 2020-09-04 DIAGNOSIS — R0602 Shortness of breath: Secondary | ICD-10-CM

## 2020-09-04 NOTE — Telephone Encounter (Signed)
Per daughter patient has been seen yesterday for right upper quadrant pain and abdominal bloating.  Advised to have an echo after discussing with Dr. Stanford Breed.  Will place order for echo.  Daughter aware.

## 2020-09-05 ENCOUNTER — Ambulatory Visit (HOSPITAL_COMMUNITY): Payer: 59 | Attending: Cardiology

## 2020-09-05 ENCOUNTER — Other Ambulatory Visit: Payer: Self-pay

## 2020-09-05 DIAGNOSIS — R0602 Shortness of breath: Secondary | ICD-10-CM | POA: Insufficient documentation

## 2020-09-05 LAB — ECHOCARDIOGRAM COMPLETE
Area-P 1/2: 3.27 cm2
S' Lateral: 2.6 cm

## 2020-09-05 MED ORDER — PERFLUTREN LIPID MICROSPHERE
1.0000 mL | INTRAVENOUS | Status: AC | PRN
  Administered 2020-09-05: 2 mL via INTRAVENOUS

## 2020-09-06 ENCOUNTER — Ambulatory Visit (INDEPENDENT_AMBULATORY_CARE_PROVIDER_SITE_OTHER)
Admission: RE | Admit: 2020-09-06 | Discharge: 2020-09-06 | Disposition: A | Discharge status: Home / Self Care | Payer: 59 | Source: Ambulatory Visit | Attending: Internal Medicine | Admitting: Internal Medicine

## 2020-09-06 ENCOUNTER — Other Ambulatory Visit (INDEPENDENT_AMBULATORY_CARE_PROVIDER_SITE_OTHER): Payer: 59

## 2020-09-06 DIAGNOSIS — J90 Pleural effusion, not elsewhere classified: Secondary | ICD-10-CM | POA: Diagnosis not present

## 2020-09-06 LAB — BASIC METABOLIC PANEL
BUN: 13 mg/dL (ref 6–23)
CO2: 26 mEq/L (ref 19–32)
Calcium: 9.1 mg/dL (ref 8.4–10.5)
Chloride: 102 mEq/L (ref 96–112)
Creatinine, Ser: 1.42 mg/dL — ABNORMAL HIGH (ref 0.40–1.20)
GFR: 37.24 mL/min — ABNORMAL LOW (ref >60.00)
Glucose, Bld: 89 mg/dL (ref 70–99)
Potassium: 4.2 mEq/L (ref 3.5–5.1)
Sodium: 137 mEq/L (ref 135–145)

## 2020-09-09 ENCOUNTER — Other Ambulatory Visit: Payer: Self-pay

## 2020-09-09 ENCOUNTER — Other Ambulatory Visit (INDEPENDENT_AMBULATORY_CARE_PROVIDER_SITE_OTHER): Payer: 59

## 2020-09-09 DIAGNOSIS — R935 Abnormal findings on diagnostic imaging of other abdominal regions, including retroperitoneum: Secondary | ICD-10-CM

## 2020-09-09 DIAGNOSIS — J90 Pleural effusion, not elsewhere classified: Secondary | ICD-10-CM

## 2020-09-09 DIAGNOSIS — R7989 Other specified abnormal findings of blood chemistry: Secondary | ICD-10-CM | POA: Diagnosis not present

## 2020-09-09 DIAGNOSIS — K7460 Unspecified cirrhosis of liver: Secondary | ICD-10-CM

## 2020-09-09 DIAGNOSIS — K7030 Alcoholic cirrhosis of liver without ascites: Secondary | ICD-10-CM

## 2020-09-09 LAB — BASIC METABOLIC PANEL
BUN: 14 mg/dL (ref 6–23)
CO2: 25 mEq/L (ref 19–32)
Calcium: 9.3 mg/dL (ref 8.4–10.5)
Chloride: 103 mEq/L (ref 96–112)
Creatinine, Ser: 1.46 mg/dL — ABNORMAL HIGH (ref 0.40–1.20)
GFR: 36.07 mL/min — ABNORMAL LOW (ref >60.00)
Glucose, Bld: 94 mg/dL (ref 70–99)
Potassium: 4.7 mEq/L (ref 3.5–5.1)
Sodium: 137 mEq/L (ref 135–145)

## 2020-09-12 ENCOUNTER — Inpatient Hospital Stay: Admission: RE | Admit: 2020-09-12 | Payer: 59 | Source: Ambulatory Visit

## 2020-09-13 ENCOUNTER — Ambulatory Visit: Payer: 59 | Admitting: Podiatry

## 2020-09-16 ENCOUNTER — Telehealth: Payer: Self-pay | Admitting: Internal Medicine

## 2020-09-16 DIAGNOSIS — K7460 Unspecified cirrhosis of liver: Secondary | ICD-10-CM

## 2020-09-16 DIAGNOSIS — K7581 Nonalcoholic steatohepatitis (NASH): Secondary | ICD-10-CM

## 2020-09-16 NOTE — Telephone Encounter (Signed)
CT chest/ab/pel denied.  Peer to peer can be done at [phone].  Here is what they sent.  I can fax the full denial to you if you would like.  Just let me know.   For [phone] CT THORAX W/ CONTRAST Based on [employer] Chest Imaging Guidelines Section(s): CH 18.1 Pleural Effusion, we cannot approve this request. Your records show that you may have a problem with excess fluid in the space between your lungs and your chest wall. The request cannot be approved because: Guidelines support a thoracentesis for the evaluation of pleural effusion, to determine if fluid is exudative or transudative and remove as much fluid as possible, prior to considering other imaging. The clinical information provided does not describe these results and, therefore, the request is not indicated at this time. For [phone] CT ABDOMEN & PELVIS W/ CONTRAST Based on [employer] Abdomen Imaging Guidelines Section(s): AB 26.1 Chronic Liver Disease, Cirrhosis and Screening for [facility] and Preface to the Imaging Guidelines, section Preface-3 Clinical Information, we cannot approve this request. Your records show that you may have a problem with your liver. The request cannot be approved because: We did not receive results of an ultrasound (picture study using sound waves) that were limited or failed to diagnose (find the source of) your problem. One of the following must be met. -Results of prior imaging showed evidence that your condition has progressed or recurred. -The requested study would affect how your doctor is managing your condition.

## 2020-09-17 NOTE — Telephone Encounter (Signed)
Note I also spoke to her and she is better regarding dyspnea and cough but is having a lot of fatigue.  I have ordered a CBC, INR, AFP and CMET for her to get tomorrow

## 2020-09-17 NOTE — Telephone Encounter (Signed)
At this point, they will not let us add any additional info/records (I called yesterday to do that before I created this note).  All that can be done at this point is call for a peer to peer [phone]. Case Ref# [id number] [facility] Member WW#[id number].

## 2020-09-17 NOTE — Telephone Encounter (Signed)
Regarding CT chest, she has a right lower lobe opacity or lesion which can be used to justify the CT of the chest.  Regarding CT abdomen and pelvis look in care everywhere there was an ultrasound in [facility] on 09/03/2020 with a right lower quadrant lesion which was the reason for the CT of the abdomen and pelvis

## 2020-09-17 NOTE — Telephone Encounter (Signed)
Peer-to-peer with Dr. Lavonne Chick  Result was approval of CT chest abdomen pelvis with contrast  Approval # [id number]  Expiration 11/01/2020  Please notify/reschedule these procedures for the patient hopefully ASAP

## 2020-09-18 NOTE — Telephone Encounter (Signed)
Thank you Sir! I have notified Davida at [facility].  She will call the patient to get rescheduled.

## 2020-09-18 NOTE — Telephone Encounter (Signed)
Patient has been scheduled for tomorrow .

## 2020-09-19 ENCOUNTER — Other Ambulatory Visit: Payer: Self-pay

## 2020-09-19 ENCOUNTER — Other Ambulatory Visit (INDEPENDENT_AMBULATORY_CARE_PROVIDER_SITE_OTHER): Payer: 59

## 2020-09-19 ENCOUNTER — Ambulatory Visit (INDEPENDENT_AMBULATORY_CARE_PROVIDER_SITE_OTHER)
Admission: RE | Admit: 2020-09-19 | Discharge: 2020-09-19 | Disposition: A | Discharge status: Home / Self Care | Payer: 59 | Source: Ambulatory Visit | Attending: Internal Medicine | Admitting: Internal Medicine

## 2020-09-19 DIAGNOSIS — K7581 Nonalcoholic steatohepatitis (NASH): Secondary | ICD-10-CM

## 2020-09-19 DIAGNOSIS — R935 Abnormal findings on diagnostic imaging of other abdominal regions, including retroperitoneum: Secondary | ICD-10-CM

## 2020-09-19 DIAGNOSIS — K7460 Unspecified cirrhosis of liver: Secondary | ICD-10-CM

## 2020-09-19 DIAGNOSIS — J90 Pleural effusion, not elsewhere classified: Secondary | ICD-10-CM

## 2020-09-19 LAB — CBC WITH DIFFERENTIAL/PLATELET
Basophils Absolute: 0.1 10*3/uL (ref 0.0–0.1)
Basophils Relative: 0.9 % (ref 0.0–3.0)
Eosinophils Absolute: 0.2 10*3/uL (ref 0.0–0.7)
Eosinophils Relative: 2.3 % (ref 0.0–5.0)
HCT: 39.3 % (ref 36.0–46.0)
Hemoglobin: 13.3 g/dL (ref 12.0–15.0)
Lymphocytes Relative: 24.3 % (ref 12.0–46.0)
Lymphs Abs: 1.6 10*3/uL (ref 0.7–4.0)
MCHC: 33.9 g/dL (ref 30.0–36.0)
MCV: 93.2 fl (ref 78.0–100.0)
Monocytes Absolute: 0.7 10*3/uL (ref 0.1–1.0)
Monocytes Relative: 10.8 % (ref 3.0–12.0)
Neutro Abs: 4.1 10*3/uL (ref 1.4–7.7)
Neutrophils Relative %: 61.7 % (ref 43.0–77.0)
Platelets: 122 10*3/uL — ABNORMAL LOW (ref 150.0–400.0)
RBC: 4.22 Mil/uL (ref 3.87–5.11)
RDW: 14.4 % (ref 11.5–15.5)
WBC: 6.7 10*3/uL (ref 4.0–10.5)

## 2020-09-19 LAB — COMPREHENSIVE METABOLIC PANEL
ALT: 20 U/L (ref 0–35)
AST: 40 U/L — ABNORMAL HIGH (ref 0–37)
Albumin: 3.2 g/dL — ABNORMAL LOW (ref 3.5–5.2)
Alkaline Phosphatase: 86 U/L (ref 39–117)
BUN: 17 mg/dL (ref 6–23)
CO2: 24 mEq/L (ref 19–32)
Calcium: 9 mg/dL (ref 8.4–10.5)
Chloride: 101 mEq/L (ref 96–112)
Creatinine, Ser: 1.31 mg/dL — ABNORMAL HIGH (ref 0.40–1.20)
GFR: 40.87 mL/min — ABNORMAL LOW (ref >60.00)
Glucose, Bld: 93 mg/dL (ref 70–99)
Potassium: 3.9 mEq/L (ref 3.5–5.1)
Sodium: 132 mEq/L — ABNORMAL LOW (ref 135–145)
Total Bilirubin: 2.9 mg/dL — ABNORMAL HIGH (ref 0.2–1.2)
Total Protein: 6.8 g/dL (ref 6.0–8.3)

## 2020-09-19 LAB — PROTIME-INR
INR: 1.2 ratio — ABNORMAL HIGH (ref 0.8–1.0)
Prothrombin Time: 13.8 s — ABNORMAL HIGH (ref 9.6–13.1)

## 2020-09-19 MED ORDER — IOHEXOL 300 MG/ML  SOLN
80.0000 mL | Freq: Once | INTRAMUSCULAR | Status: AC | PRN
  Administered 2020-09-19: 80 mL via INTRAVENOUS

## 2020-09-20 ENCOUNTER — Other Ambulatory Visit: Payer: Self-pay

## 2020-09-20 DIAGNOSIS — N9489 Other specified conditions associated with female genital organs and menstrual cycle: Secondary | ICD-10-CM

## 2020-09-20 LAB — AFP TUMOR MARKER: AFP-Tumor Marker: 1.5 ng/mL

## 2020-09-26 ENCOUNTER — Ambulatory Visit (HOSPITAL_COMMUNITY): Payer: 59

## 2020-10-03 ENCOUNTER — Ambulatory Visit (HOSPITAL_COMMUNITY)
Admission: RE | Admit: 2020-10-03 | Discharge: 2020-10-03 | Disposition: A | Discharge status: Home / Self Care | Payer: 59 | Source: Ambulatory Visit | Attending: Internal Medicine | Admitting: Internal Medicine

## 2020-10-03 ENCOUNTER — Other Ambulatory Visit: Payer: Self-pay

## 2020-10-03 DIAGNOSIS — N9489 Other specified conditions associated with female genital organs and menstrual cycle: Secondary | ICD-10-CM

## 2020-10-14 ENCOUNTER — Encounter: Payer: Self-pay | Admitting: Obstetrics and Gynecology

## 2020-10-14 ENCOUNTER — Ambulatory Visit (INDEPENDENT_AMBULATORY_CARE_PROVIDER_SITE_OTHER): Payer: 59 | Admitting: Obstetrics and Gynecology

## 2020-10-14 ENCOUNTER — Other Ambulatory Visit: Payer: Self-pay

## 2020-10-14 VITALS — BP 124/80 | Ht 64.0 in | Wt 185.0 lb

## 2020-10-14 DIAGNOSIS — R9389 Abnormal findings on diagnostic imaging of other specified body structures: Secondary | ICD-10-CM | POA: Diagnosis not present

## 2020-10-14 DIAGNOSIS — Z780 Asymptomatic menopausal state: Secondary | ICD-10-CM

## 2020-10-14 DIAGNOSIS — N840 Polyp of corpus uteri: Secondary | ICD-10-CM | POA: Diagnosis not present

## 2020-10-14 DIAGNOSIS — N83201 Unspecified ovarian cyst, right side: Secondary | ICD-10-CM

## 2020-10-14 DIAGNOSIS — N882 Stricture and stenosis of cervix uteri: Secondary | ICD-10-CM

## 2020-10-14 NOTE — Progress Notes (Signed)
Dawn Moon [birth year] [id number]  SUBJECTIVE:  64 y.o. G3P3003 female referred by Dr. Carlean Purl for finding of an enlarging right adnexal cyst and endometrial thickening on ultrasound.  She presents today with her husband also present.  It looks like she has been followed for a history of liver problems including cirrhosis and abdominal pain.  Most recently 09/19/2020 on the CT of the abdomen and pelvis there was noted to be a 5.3 x 3.8 cm cystic lesion in the right adnexa.  Comparison to previous CT from 03/12/2020 showed it was similar in size but compared to the previous years CT study 02/27/2020 it was significantly larger as at that time it measured only 2.9 cm.  She then had a pelvic ultrasound to further characterize the cyst on 10/03/2020 and it measured 6.6 x 4.5 x 5.2 cm.  There is no solid mass or component and the cyst overall appears lucent and simple with perhaps a few faint septations.  She had moderate free fluid in the pelvis.  Also noted on the study was thickened endometrium measuring 17 mm.  She generally has not had vaginal bleeding except sometimes after sexual relations she will has had some transient vaginal bleeding.  She has occasional right-sided abdominal pains that have been ongoing for the past few years.  She tells me she has had a normal colonoscopy without significant findings within the past 5 years.  No family history of ovarian, endometrial, breast, or colon cancer.  She says her grandmother had a lung cancer and then was later on diagnosed with some sort of abdominal cancer that was already at end-stage and it was unclear regarding the rest of the details of that.   Current Outpatient Medications  Medication Sig Dispense Refill  . diphenoxylate-atropine (LOMOTIL) 2.5-0.025 MG tablet 1-2 tabs every 6 hours as needed 60 tablet 2  . furosemide (LASIX) 40 MG tablet TAKE 1 TABLET DAILY 90 tablet 3  . lactulose (CHRONULAC) 10 GM/15ML solution Take 10 g by mouth daily  as needed.    . nadolol (CORGARD) 20 MG tablet Take 0.5-1 tablets (10-20 mg total) by mouth daily as needed. 90 tablet 1  . omeprazole (PRILOSEC) 40 MG capsule Take by mouth daily.    . potassium chloride SA (KLOR-CON) 20 MEQ tablet Take 1 tablet (20 mEq total) by mouth daily. 90 tablet 3  . promethazine (PHENERGAN) 25 MG tablet Take 1 tablet (25 mg total) by mouth every 6 (six) hours as needed for nausea or vomiting. 30 tablet 1  . rifaximin (XIFAXAN) 550 MG TABS tablet Take 1 tablet (550 mg total) by mouth 2 (two) times daily. 60 tablet 5  . spironolactone (ALDACTONE) 100 MG tablet TAKE 1 TABLET DAILY 90 tablet 3   No current facility-administered medications for this visit.   Allergies: Patient has no known allergies.  No LMP recorded. Patient is postmenopausal.  Past medical history,surgical history, problem list, medications, allergies, family history and social history were all reviewed and documented as reviewed in the EPIC chart.  ROS: Pertinent positives and negatives as reviewed in HPI.   OBJECTIVE:  BP 124/80   Ht 5' 4"  (1.626 m)   Wt 185 lb (83.9 kg)   BMI 31.76 kg/m  The patient appears well, alert, oriented, in no distress. PELVIC EXAM: VULVA: normal appearing vulva with atrophic change and 1 cm diameter flat condylomatous appearing lesion located in the anterior labia majora, no masses, tenderness or lesions, VAGINA: normal appearing vagina with atrophic change, normal  color and discharge, no lesions, CERVIX: normal appearing cervix without discharge or lesions, stenotic external os, UTERUS: uterus is normal size, shape, consistency and nontender, ADNEXA: normal left adnexa in size, nontender and no masses, right adnexa with fullness and mild tenderness  Endometrial Biopsy Procedure Note  Cheyenna Moon 1956-05-13 [id number] The cervix was cleansed with a Betadine swab.  The anterior lip of the cervix was grasped with an Allis clamp.  The external os was just a  dimple with apparent stenosis/scarring.  The cervical os finder was used to gently dilate past this stenosis with ease.  The endometrial Pipelle sampler was easily introduced into the endometrial cavity and sound length measurement was taken (10 cm).  The plunger was withdrawn causing suction in the Pipelle and the device was moved about the cavity for about 10 sec.  The Pipelle was removed then the small amount of tissue sample was emptied in a specimen cup, maintaining sterility.  The Pipelle was reintroduced into the endometrial cavity to collect a bit more sample.  The specimen contents were collected and sent to pathology for analysis.  The patient tolerated the procedure well without any complication.  Caryn Bee present during the procedure and procedure    ASSESSMENT:  64 y.o. [phone] here for evaluation of enlarging right adnexal cyst and endometrial thickening  PLAN:  The patient will go to lab to check CA-125, CEA, inhibin B We discussed and reviewed the findings on her recent imaging. We reviewed the pelvic ultrasound images and it is reassuring that the cyst appears mostly lucent and simple with no solid components.  This makes me think it is more likely benign.  However the interval growth since 18 months ago is potentially concerning for a neoplasm.  I would recommend surgical excision so he can have pathology analysis of the cyst.  If her tumor markers are negative then we can certainly proceed with a laparoscopic bilateral salpingo-oophorectomy with excision of the adnexal cyst pelvic washings.  If there are any abnormalities on those tests then I would recommend referral to GYN oncology in that case surgical consultation.  We will let her know the results on that. In regard to the incidental finding of the endometrial thickening on ultrasound, it sounds like she has been fairly asymptomatic but does have some postcoital bleeding (which could also possibly be due to vaginal atrophy in  menopause).  She had a normal Pap smear in 2019.  We did go ahead and get an endometrial biopsy today to evaluate for abnormal endometrial tissue (hyperplasia or carcinoma).  A small amount of tissue sample was obtained today.  We will let the patient know about the pathology results when available.  If the tissue is just benign and there is no hyperplasia, then she does not need a hysterectomy, but if there is hyperplasia (or certainly if there is carcinoma detected) then hysterectomy would be indicated, and depending on the level of tissue abnormality, again, it may be better to have her see oncology for surgical consultation in that case. I will follow up with her in regard to the pathology and blood test results from today we will help her decide how to best proceed from there.  All questions were answered by the end of the visit.   [name] MD 10/14/20

## 2020-10-15 ENCOUNTER — Telehealth: Payer: Self-pay | Admitting: *Deleted

## 2020-10-15 ENCOUNTER — Encounter: Payer: Self-pay | Admitting: Obstetrics and Gynecology

## 2020-10-15 NOTE — Telephone Encounter (Signed)
Staff message sent to referral coordinator at Gyn-oncology office she will call to schedule.

## 2020-10-15 NOTE — Telephone Encounter (Signed)
-----   Message from Ramond Craver, [place] sent at 10/15/2020 10:02 AM EDT ----- Regarding: referral to GYN Mable Fill, MD  [facility] Clinical Pool Please help Mrs. Mulkern with a referral to gyn oncology because the CA 125 was significantly elevated.

## 2020-10-15 NOTE — Telephone Encounter (Signed)
I spoke with patient. She had read your message regarding CA125 and recommendation for referral to [place]. She knows we are in process of arranging that and she will hear from someone with appointment. No questions at this time.

## 2020-10-16 ENCOUNTER — Encounter: Payer: Self-pay | Admitting: Obstetrics and Gynecology

## 2020-10-16 ENCOUNTER — Telehealth: Payer: Self-pay | Admitting: *Deleted

## 2020-10-16 LAB — TISSUE SPECIMEN

## 2020-10-16 LAB — PATHOLOGY REPORT

## 2020-10-16 NOTE — Telephone Encounter (Signed)
Patient scheduled on 11/5 at 9am with Dr Denman George

## 2020-10-16 NOTE — Telephone Encounter (Signed)
Called the patient and scheduled a new patient on 11/5 at 9 am with Dr Denman George. Gave the patient the address and phone number for the clinic. Also gave the policy for mask and visitors

## 2020-10-18 LAB — CA 125: CA 125: 353 U/mL — ABNORMAL HIGH (ref <35)

## 2020-10-18 LAB — CEA: CEA: 1 ng/mL

## 2020-10-18 LAB — INHIBIN B: Inhibin B: <10 pg/mL

## 2020-10-25 ENCOUNTER — Encounter: Payer: Self-pay | Admitting: Gynecologic Oncology

## 2020-10-25 ENCOUNTER — Inpatient Hospital Stay: Payer: 59 | Attending: Gynecologic Oncology | Admitting: Gynecologic Oncology

## 2020-10-25 ENCOUNTER — Telehealth: Payer: Self-pay | Admitting: *Deleted

## 2020-10-25 ENCOUNTER — Other Ambulatory Visit: Payer: Self-pay

## 2020-10-25 VITALS — BP 120/64 | HR 72 | Temp 98.6°F | Resp 20 | Ht 62.0 in | Wt 182.0 lb

## 2020-10-25 DIAGNOSIS — R971 Elevated cancer antigen 125 [CA 125]: Secondary | ICD-10-CM | POA: Insufficient documentation

## 2020-10-25 DIAGNOSIS — M1711 Unilateral primary osteoarthritis, right knee: Secondary | ICD-10-CM | POA: Diagnosis not present

## 2020-10-25 DIAGNOSIS — R188 Other ascites: Secondary | ICD-10-CM | POA: Diagnosis not present

## 2020-10-25 DIAGNOSIS — R9389 Abnormal findings on diagnostic imaging of other specified body structures: Secondary | ICD-10-CM

## 2020-10-25 DIAGNOSIS — Z79899 Other long term (current) drug therapy: Secondary | ICD-10-CM | POA: Insufficient documentation

## 2020-10-25 DIAGNOSIS — K7460 Unspecified cirrhosis of liver: Secondary | ICD-10-CM | POA: Diagnosis not present

## 2020-10-25 DIAGNOSIS — I10 Essential (primary) hypertension: Secondary | ICD-10-CM | POA: Insufficient documentation

## 2020-10-25 DIAGNOSIS — K7581 Nonalcoholic steatohepatitis (NASH): Secondary | ICD-10-CM

## 2020-10-25 DIAGNOSIS — K589 Irritable bowel syndrome without diarrhea: Secondary | ICD-10-CM | POA: Diagnosis not present

## 2020-10-25 DIAGNOSIS — N9489 Other specified conditions associated with female genital organs and menstrual cycle: Secondary | ICD-10-CM | POA: Insufficient documentation

## 2020-10-25 DIAGNOSIS — K219 Gastro-esophageal reflux disease without esophagitis: Secondary | ICD-10-CM | POA: Insufficient documentation

## 2020-10-25 DIAGNOSIS — D398 Neoplasm of uncertain behavior of other specified female genital organs: Secondary | ICD-10-CM

## 2020-10-25 NOTE — Patient Instructions (Signed)
Dr Denman George does not feel that this right ovarian cyst represents a cancer.  Given that it is asymptomatic and that you have cirrhosis with laboratory abnormalities including low platelet count, surgery risks would outweigh any benefits at this time.  Therefore Dr. Denman George is recommending ultrasound with Dr. Delilah Shan at 36-monthintervals until March 2022.  After that point in time, if the cyst remains stable, he can transition to annual ultrasounds.  Dr. RDenman Georgeis recommending that Dr. KDelilah Shanconsider a D&C procedure for more sampling of the endometrium which is thickened which can better rule out cancer of the uterus and can decrease the likelihood of bleeding symptoms.  If this were to show a cancer, Dr. RDenman Georgewould recommend a surgery at that time, because in that situation the risks of surgery are better balanced by the risks of the underlying disease.  Please contact Dr. RDenman Georgewith any questions at [phone]. Please reach out to Dr. KScarlette Aroffice before Thanksgiving if you have not heard about a plan for a D&C procedure.

## 2020-10-25 NOTE — Telephone Encounter (Signed)
Per Dr Denman George, Fax today office visit to Dr Loletha Grayer at [facility]

## 2020-10-25 NOTE — Progress Notes (Addendum)
Consult Note: Gyn-Onc  Consult was requested by Dr. Delilah Shan for the evaluation of Dawn Moon 64 y.o. female  CC:  Chief Complaint  Patient presents with  . Adnexal mass  . Elevated cancer antigen 125 (CA 125)  . NASH hepatitis    Assessment/Plan:  Dawn Moon  is a 64 y.o.  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  Together we reviewed the images from both her CT scan and her ultrasound scan from September 2021.  I explained that I have a low suspicion of malignancy given the lack of solid components to the cystic area, in addition to its stability over time.  It is uniform in shape and lacks classic findings for malignancy.  I explained that the elevation in Ca1 25 is almost certainly secondary to her [name] cirrhosis, which is unknown cause for elevations in Ca1 25.  Her endometrial biopsy was benign, but revealed only scant tissue, and given her sick significantly thickened endometrium on ultrasound I am recommending better sampling with D&C, and the addition of hysteroscopy if felt to be beneficial.  I will refer her back to Dr. Delilah Shan for this procedure.  If this were to show malignancy, she would need evaluation for treatment of her endometrial cancer.  In the absence of malignancy in the endometrium no further treatment would be necessary.  I explained to the patient that NASH cirrhosis is associated with an increased risk for mortality at the time of abdominal surgeries.  Therefore elective abdominal surgeries should not be undertaken unless the benefits outweigh the risk.  In her case, the cyst is asymptomatic, therefore given its benign appearance, the risks do not outweigh the benefits.  I am recommending surveillance ultrasound in March 2022 performed by Dr. Delilah Shan.  If this demonstrates stability in the cyst, she can transition to  annual ultrasound evaluations until 2026.  If the cyst remains stable over that period of time ultrasounds can discontinue.  A significant change in size would be represented by a 50% increase in size on ultrasound, or new development of solid components.  I do not recommend checking Ca125 in the future for Dawn Moon, as it is an unreliable lab test for her given her underlying cirrhosis.   HPI: Dawn Moon is a 64 year old P3 who was seen in consultation at the request of Dr Delilah Shan for evaluation of a right ovarian cyst and elevated CA 125 as well as thickened endometrium in the setting of NASH cirrhosis  The patient has been followed for NASH cirrhosis by Dr. Lynford Humphrey and her hepatologist at [facility], Dr. Enis Gash, and developed exacerbation of edema and pleural effusions and ascites in the springtime of 2021 which prompted imaging.   She had follow-up imaging in September 19, 2020 for a right lower lobe abnormality on a previous chest x-ray and a known pleural effusion in addition to a right lower quadrant cystic lesion that had been seen on a previous ultrasound.  This CT was performed on 09/19/2020 and revealed a small amount of ascites.  There was a 5.3 x 3.8 cm cystic lesion along the broad ligament and right adnexal space that was similar to the March 12, 2020 CT scan.  It had been present on a March 2020 CT scan but was smaller at 2.9 cm.  The 5 mm left upper lobe pulmonary nodule was  nonspecific.  The CT demonstrated cirrhotic liver morphology and evidence of portal venous hypertension with perigastric and periesophageal varices.  There was a thickened endometrium seen.  A follow-up transvaginal pelvic ultrasound was performed on 10/03/2020 and confirmed a uterus measuring 9.7 x 4.5 x 5.1 cm with a thickened heterogeneous endometrium measuring up to 17 mm.  The right ovary was replaced by a cyst that measured 6.6 x 4.5 x 5.2 cm with no solid mass identified.  The left ovary was  nonvisualized.  There was a moderate amount of free fluid in the pelvis.  She endorsed a 64-monthhistory of postmenopausal spotting.  In follow-up to her ultrasound showing a thickened endometrium her gynecologist, Dr. KDelilah Shan performed an endometrial Pipelle biopsy on 10/14/2020 which showed rare fragments of endometrium that were superficial endometrial stroma and a few intact endometrial glands.  No hyperplasia or malignancy was seen though tissue was scant.  Most recent laboratory evaluation reveals elevated liver function test, hypoalbuminemia, and thrombocytopenia with a platelet count of 122.  Her medical history is most significant for NASH cirrhosis [facility]  MELD score with Dr BLoletha Grayeron 07/03/20 was 15.   It has been fairly stable over the last year.  She is not currently being considered for a liver transplant.  Her symptoms are mostly that of edema and pleural effusions and ascites which are managed with spironolactone and Lasix.  She also has medical history significant for hypertension, degenerative joint disease.  Her surgical history is most significant for tubal ligation, she has had back and joint surgeries and a right knee surgery in [id number] was complicated by postoperative decompensation of her NKarlene Linemancirrhosis with progressive edema.  Her gynecologic history is remarkable for 3 prior vaginal deliveries, no abnormal Pap smears.  Her family cancer history is a mother with a history of non-Hodgkin's lymphoma treated with Dr EMarin Olp  She has a maternal aunt with a history of breast cancer at an older age.  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 She lives with her husband.  Her daughter attended her visit with me at the first consultation and is a supportive presents.   Current Meds:  Outpatient Encounter Medications as of 10/25/2020  Medication Sig  . baclofen (LIORESAL) 10 MG tablet Take  10 mg by mouth 2 (two) times daily as needed.  . diphenoxylate-atropine (LOMOTIL) 2.5-0.025 MG tablet 1-2 tabs every 6 hours as needed  . furosemide (LASIX) 40 MG tablet TAKE 1 TABLET DAILY  . lactulose (CHRONULAC) 10 GM/15ML solution Take 10 g by mouth daily as needed.  . nadolol (CORGARD) 20 MG tablet Take 0.5-1 tablets (10-20 mg total) by mouth daily as needed.  .Marland Kitchenomeprazole (PRILOSEC) 40 MG capsule Take by mouth daily.  . potassium chloride SA (KLOR-CON) 20 MEQ tablet Take 1 tablet (20 mEq total) by mouth daily.  . promethazine (PHENERGAN) 25 MG tablet Take 1 tablet (25 mg total) by mouth every 6 (six) hours as needed for nausea or vomiting.  . rifaximin (XIFAXAN) 550 MG TABS tablet Take 1 tablet (550 mg total) by mouth 2 (two) times daily.  .Marland Kitchenspironolactone (ALDACTONE) 100 MG tablet TAKE 1 TABLET DAILY  . traMADol (ULTRAM) 50 MG tablet Take 50 mg by mouth 2 (two) times daily as needed.  . [DISCONTINUED] promethazine-dextromethorphan (PROMETHAZINE-DM) 6.25-15 MG/5ML syrup Take 5 mLs by mouth 4 (four) times daily as needed.  No facility-administered encounter medications on file as of 10/25/2020.    Allergy: No Known Allergies  Social Hx:   Social History   Socioeconomic History  . Marital status: Married    Spouse name: Not on file  . Number of children: Not on file  . Years of education: Not on file  . Highest education level: Not on file  Occupational History  . 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 Smoking status: Never Smoker  . Smokeless tobacco: Never Used  Vaping Use  . Vaping Use: Never used  Substance and Sexual Activity  . Alcohol use: Yes    Comment: Very rare  . Drug use: No  . Sexual activity: Yes    Birth control/protection: Post-menopausal    Comment: 1st intercourse 64 yo-Fewer than 5 partners  Other Topics Concern  . 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  Has to walk up stairs to work and can do w/o Ss.  Lives @ home in [place] with family.   Social Determinants of Health   Financial Resource Strain:   . Difficulty of Paying Living Expenses: Not on file  Food Insecurity:   . Worried About Charity fundraiser in the Last Year: Not on file  . Ran Out of Food in the Last Year: Not on file  Transportation Needs:   . Lack of Transportation (Medical): Not on file  . Lack of Transportation (Non-Medical): Not on file  Physical Activity:   . Days of Exercise per Week: Not on file  . Minutes of Exercise per Session: Not on file  Stress:   . Feeling of Stress : Not on file  Social Connections:   . Frequency of Communication with Friends and Family: Not on file  . Frequency of Social Gatherings with Friends and Family: Not on file  . Attends Religious Services: Not on file  . Active Member of Clubs or Organizations: Not on file  . Attends Archivist Meetings: Not on file  . Marital Status: Not on file  Intimate Partner Violence:   . Fear of Current or Ex-Partner: Not on file  . Emotionally Abused: Not on file  . Physically Abused: Not on file  . Sexually Abused: Not on file    Past Surgical Hx:  Past Surgical History:  Procedure Laterality Date  . ACHILLES TENDON SURGERY Left 06/2016  . BREAST BIOPSY Left 02/2016   benign  . COLONOSCOPY  2005  . CT CTA CORONARY W/CA SCORE W/CM &/OR WO/CM  01/01/2014   non-obstructive calcified plaque in pLAD (0-25%), no significant incidental noncardiac findings noted  . ESOPHAGOGASTRODUODENOSCOPY  01/2014  . EXTRACORPOREAL SHOCK WAVE LITHOTRIPSY  2010  . KNEE ARTHROPLASTY Right 03/24/2018   Procedure: RIGHT TOTAL KNEE ARTHROPLASTY WITH COMPUTER NAVIGATION;  Surgeon: Rod Can, MD;  Location: [facility];  Service: Orthopedics;  Laterality: Right;  Needs RNFA  . KNEE  ARTHROSCOPY Right 12/04/2016   Procedure: ARTHROSCOPY KNEE WITH PARTIAL MEDIAL MENISCECTOMY;  Surgeon: Rod Can, MD;  Location: [place];  Service: Orthopedics;  Laterality: Right;  . LEFT HEART CATHETERIZATION WITH CORONARY ANGIOGRAM N/A 01/11/2012   Procedure: LEFT HEART CATHETERIZATION WITH CORONARY ANGIOGRAM;  Surgeon: Sherren Mocha, MD;  Location: [facility]  CATH LAB;  Service: Cardiovascular;  Laterality: N/A;  widely patent coronary arterires without significant obstructive CAD,  normal LVF, ef 55-56%  . TONSILLECTOMY AND ADENOIDECTOMY  child  . TRANSTHORACIC ECHOCARDIOGRAM  01/18/2012   mild LVH,  ef 60%/  trivial TR  . TUBAL LIGATION  1985  . UPPER GASTROINTESTINAL ENDOSCOPY      Past Medical Hx:  Past Medical History:  Diagnosis Date  . Ascites   . Encephalopathy, hepatic ([place]) 05/13/2018  . GERD (gastroesophageal reflux disease)   . History of exercise stress test    03-07-2010  Stress echo--- no arrhythmias or conduction abnormalilites and negative for ischemia or chest pain  . History of kidney stones   . History of paroxysmal supraventricular tachycardia    episode 2011  consult w/ dr Caryl Comes --  put on atenolol--  per pt no longer an issue  . HTN (hypertension)    cardiologist --  dr Stanford Breed  . IBS (irritable bowel syndrome)    diarrhea  . Kidney stones    kidney infections as well  . Nonalcoholic steatohepatitis (NASH)   . Numbness and tingling of left lower extremity    post achilles tendon repair  . OA (osteoarthritis)    knees  . Pleural effusion on right    hepatic hydrothorax  . PONV (postoperative nausea and vomiting)   . Recurrent UTI 06/12/2019  . Secondary esophageal varices without bleeding ([place]) 06/03/2018  . Vitamin D deficiency   . Wears glasses     Past Gynecological History: See HPI No LMP recorded. Patient is postmenopausal.  Family Hx:  Family History  Problem Relation Age of Onset  . Lymphoma Mother        non-hodgkins -  died @ 75  . Coronary artery disease Mother   . Coronary artery disease Father        CABG in his 19s, died @ 33  . Diabetes Father   . Healthy Brother   . Hypertension Sister   . Arthritis Sister   . IgA nephropathy Daughter   . Kidney failure Daughter   . Celiac disease Daughter   . Breast cancer Maternal Aunt   . Colon cancer Neg Hx   . Esophageal cancer Neg Hx   . Stomach cancer Neg Hx   . Rectal cancer Neg Hx   . Ovarian cancer Neg Hx   . Cervical cancer Neg Hx     Review of Systems:  Constitutional  Feels well,    ENT Normal appearing ears and nares bilaterally Skin/Breast  No rash, sores, jaundice, itching, dryness Cardiovascular  No chest pain, shortness of breath, or edema  Pulmonary  No cough or wheeze.  Gastro Intestinal  + abdominal distension Genito Urinary  No frequency, urgency, dysuria, + postmenopausal spotting Musculo Skeletal  + peripheral edema Neurologic  No weakness, numbness, change in gait,  Psychology  No depression, anxiety, insomnia.   Vitals:  Blood pressure 120/64, pulse 72, temperature 98.6 F (37 C), temperature source Tympanic, resp. rate 20, height 5' 2"  (1.575 m), weight 182 lb (82.6 kg), SpO2 100 %.  Physical Exam: WD in NAD Neck  Supple NROM, without any enlargements.  Lymph Node Survey No cervical supraclavicular or inguinal adenopathy Cardiovascular  Well perfused peripheries Lungs  No Increased WOB Skin  No rash/lesions/breakdown  Psychiatry  Alert and oriented to person, place, and time  Abdomen  Normoactive bowel sounds, abdomen soft, non-tender and obese without evidence of hernia. + mole in umbilicus Back No  CVA tenderness Genito Urinary  Vulva/vagina: Normal external female genitalia.  No lesions. No discharge or bleeding. + vulvar non-pigmented nevus on right anterior labia majora  Bladder/urethra:  No lesions or masses, well supported bladder  Vagina: normal  Cervix: Normal appearing, no  lesions.  Uterus:  Small, mobile, no parametrial involvement or nodularity.  Adnexa: no palpable masses. Rectal  deferred  Extremities  + edema.  60 minutes of total time was spent for this patient encounter, including preparation, face-to-face counseling with the patient and coordination of care, review of imaging (results and images), communication with the referring provider and documentation of the encounter.   Thereasa Solo, MD  10/25/2020, 9:43 AM

## 2020-10-30 ENCOUNTER — Telehealth: Payer: Self-pay | Admitting: *Deleted

## 2020-10-30 DIAGNOSIS — N83201 Unspecified ovarian cyst, right side: Secondary | ICD-10-CM

## 2020-10-30 NOTE — Telephone Encounter (Signed)
Patient informed, order placed for 6 month ultrasound, transferred to appointment desk to schedule OV for surgery.

## 2020-10-30 NOTE — Telephone Encounter (Signed)
Butch Penny informed.

## 2020-10-30 NOTE — Telephone Encounter (Signed)
Please schedule her for a pelvic ultrasound in 6 months to follow up the cyst.  Since she will not be having surgery for the cyst at this time, it is reasonable to do a hysteroscopy D&C for more thorough evaluation of the endometrial lining, so please have her come in for an appointment to discuss that in the next month.

## 2020-10-30 NOTE — Telephone Encounter (Signed)
Patient wanted to know if the visit to discuss surgery can be done via video? Since she was seen on 10/16/20 and had exam? Please advise

## 2020-10-30 NOTE — Telephone Encounter (Signed)
Yes video okay

## 2020-10-30 NOTE — Telephone Encounter (Signed)
Patient called wanting to know the plan about having D&C,saw Dr.Rossi on 10/25/20 ( notes in epic to review) per [facility] office note:  Dr Denman George does not feel that this right ovarian cyst represents a cancer.  Given that it is asymptomatic and that you have cirrhosis with laboratory abnormalities including low platelet count, surgery risks would outweigh any benefits at this time.  Therefore Dr. Denman George is recommending ultrasound with Dr. Delilah Shan at 44-monthintervals until March 2022.  After that point in time, if the cyst remains stable, he can transition to annual ultrasounds.  Dr. RDenman Georgeis recommending that Dr. KDelilah Shanconsider a D&C procedure for more sampling of the endometrium which is thickened which can better rule out cancer of the uterus and can decrease the likelihood of bleeding symptoms.  If this were to show a cancer, Dr. RDenman Georgewould recommend a surgery at that time, because in that situation the risks of surgery are better balanced by the risks of the underlying disease.   I told patient I would forward this info to you and get back with her once I heard from you. Please advise

## 2020-10-31 NOTE — Progress Notes (Signed)
HPI: FU hypertensionand volume excess.CardioNet2011showed 4 beats of nonsustained ventricular tachycardia; also brief atrial tachycardia; note some of symptoms also noted to be sinus tachycardia. Patient was seen by Dr. Caryl Comes and started on verapamil. Her symptoms did not improve and she was changed to a beta blocker. Her symptoms improved with atenolol.Cardiac catheterization 1/13revealed no obstructive coronary artery disease. Her ejection fraction was 55-65%. Cardiac CT January 2015 showed nonobstructive plaque in the proximal LAD.Patient admitted April 2019 and underwent right knee replacement. Following discharge she developed worsening dyspnea. Echocardiogram May 2019 showed normal LV systolic function and mild left atrial enlargement. CTA May 2019 showed no pulmonary embolus. There was a large right pleural effusion and lobulated liver compatible with cirrhosis. There was ascites. Patient underwent thoracentesis and paracentesis. She was also diuresed. She was diagnosed with cirrhosis due to NASH.She was discharged on higher dose diuretics. Patient then readmitted with confusion and diagnosed with hepatic encephalopathy and acute renal insufficiency. Patient was treated with lactulose and xifaxan.Diuretics were reduced. She has been followed by gastroenterology as an outpatient. She was referred to [facility] for consideration of transplant. EGD March 2021 showed esophageal varices.  Last echocardiogram September 2021 showed normal LV function, grade 1 diastolic dysfunction, small pericardial fusion.  CTA September 2021 showed 5 mm left upper lobe pulmonary nodule and follow-up recommended 12 months if pt high risk, cirrhotic liver with portal venous hypertension including perigastric and paraesophageal varices and cystic lesion in the right adnexal space. Pelvic ultrasound October 2021 showed cyst in the right ovary concerning for cystic neoplasm.  There was also thickening of the  endometrium and biopsy recommended to exclude malignancy.  Sincelast seen,she has some dyspnea on exertion and fatigue.  No orthopnea or PND.  Minimal pedal edema.  No chest pain or syncope.  Current Outpatient Medications  Medication Sig Dispense Refill   baclofen (LIORESAL) 10 MG tablet Take 10 mg by mouth 2 (two) times daily as needed.     diphenoxylate-atropine (LOMOTIL) 2.5-0.025 MG tablet 1-2 tabs every 6 hours as needed 60 tablet 2   furosemide (LASIX) 40 MG tablet TAKE 1 TABLET DAILY 90 tablet 3   lactulose (CHRONULAC) 10 GM/15ML solution Take 10 g by mouth daily as needed.     nadolol (CORGARD) 20 MG tablet Take 0.5-1 tablets (10-20 mg total) by mouth daily as needed. 90 tablet 1   omeprazole (PRILOSEC) 40 MG capsule Take by mouth daily.     potassium chloride SA (KLOR-CON) 20 MEQ tablet Take 1 tablet (20 mEq total) by mouth daily. 90 tablet 3   promethazine (PHENERGAN) 25 MG tablet Take 1 tablet (25 mg total) by mouth every 6 (six) hours as needed for nausea or vomiting. 30 tablet 1   rifaximin (XIFAXAN) 550 MG TABS tablet Take 1 tablet (550 mg total) by mouth 2 (two) times daily. 60 tablet 5   spironolactone (ALDACTONE) 100 MG tablet TAKE 1 TABLET DAILY 90 tablet 3   traMADol (ULTRAM) 50 MG tablet Take 50 mg by mouth 2 (two) times daily as needed. (Patient not taking: Reported on 11/04/2020)     No current facility-administered medications for this visit.     Past Medical History:  Diagnosis Date   Ascites    Encephalopathy, hepatic ([place]) 05/13/2018   GERD (gastroesophageal reflux disease)    History of exercise stress test    03-07-2010  Stress echo--- no arrhythmias or conduction abnormalilites and negative for ischemia or chest pain   History of kidney  stones    History of paroxysmal supraventricular tachycardia    episode 2011  consult w/ dr Caryl Comes --  put on atenolol--  per pt no longer an issue   HTN (hypertension)    cardiologist --  dr Stanford Breed    IBS (irritable bowel syndrome)    diarrhea   Kidney stones    kidney infections as well   Nonalcoholic steatohepatitis ([facility])    Numbness and tingling of left lower extremity    post achilles tendon repair   OA (osteoarthritis)    knees   Pleural effusion on right    hepatic hydrothorax   PONV (postoperative nausea and vomiting)    Recurrent UTI 06/12/2019   Secondary esophageal varices without bleeding ([place]) 06/03/2018   Vitamin D deficiency    Wears glasses     Past Surgical History:  Procedure Laterality Date   ACHILLES TENDON SURGERY Left 06/2016   BREAST BIOPSY Left 02/2016   benign   COLONOSCOPY  2005   CT CTA CORONARY W/CA SCORE W/CM &/OR WO/CM  01/01/2014   non-obstructive calcified plaque in pLAD (0-25%), no significant incidental noncardiac findings noted   ESOPHAGOGASTRODUODENOSCOPY  01/2014   EXTRACORPOREAL SHOCK WAVE LITHOTRIPSY  2010   KNEE ARTHROPLASTY Right 03/24/2018   Procedure: RIGHT TOTAL KNEE ARTHROPLASTY WITH COMPUTER NAVIGATION;  Surgeon: Rod Can, MD;  Location: [facility];  Service: Orthopedics;  Laterality: Right;  Needs RNFA   KNEE ARTHROSCOPY Right 12/04/2016   Procedure: ARTHROSCOPY KNEE WITH PARTIAL MEDIAL MENISCECTOMY;  Surgeon: Rod Can, MD;  Location: [place];  Service: Orthopedics;  Laterality: Right;   LEFT HEART CATHETERIZATION WITH CORONARY ANGIOGRAM N/A 01/11/2012   Procedure: LEFT HEART CATHETERIZATION WITH CORONARY ANGIOGRAM;  Surgeon: Sherren Mocha, MD;  Location: [facility] CATH LAB;  Service: Cardiovascular;  Laterality: N/A;  widely patent coronary arterires without significant obstructive CAD,  normal LVF, ef 55-56%   TONSILLECTOMY AND ADENOIDECTOMY  child   TRANSTHORACIC ECHOCARDIOGRAM  01/18/2012   mild LVH,  ef 60%/  trivial TR   TUBAL LIGATION  1985   UPPER GASTROINTESTINAL ENDOSCOPY      Social History   Socioeconomic History   Marital status: Married    Spouse name: Not on file    Number of children: Not on file   Years of education: Not on file   Highest education level: Not on file  Occupational History   Occupation: inpatient case manager    Employer: [employer]  Tobacco Use   Smoking status: Never Smoker   Smokeless tobacco: Never Used  Scientific laboratory technician Use: Never used  Substance and Sexual Activity   Alcohol use: Yes    Comment: Very rare   Drug use: No   Sexual activity: Yes    Birth control/protection: Post-menopausal    Comment: 1st intercourse 64 yo-Fewer than 5 partners  Other Topics Concern   Not on file  Social History Narrative   Married, 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  Has to walk up stairs to work and can do w/o Ss.  Lives @ home in [place] with family.   Social Determinants of Health   Financial Resource Strain:    Difficulty of Paying Living Expenses: Not on file  Food Insecurity:    Worried About [place] in the Last Year: Not  on file   [place] in the Last Year: Not on file  Transportation Needs:    Lack of Transportation (Medical): Not on file   Lack of Transportation (Non-Medical): Not on file  Physical Activity:    Days of Exercise per Week: Not on file   Minutes of Exercise per Session: Not on file  Stress:    Feeling of Stress : Not on file  Social Connections:    Frequency of Communication with Friends and Family: Not on file   Frequency of Social Gatherings with Friends and Family: Not on file   Attends Religious Services: Not on file   Active Member of Clubs or Organizations: Not on file   Attends Archivist Meetings: Not on file   Marital Status: Not on file  Intimate Partner Violence:    Fear of Current or Ex-Partner: Not on file   Emotionally Abused: Not on file   Physically Abused: Not on file   Sexually Abused: Not on  file    Family History  Problem Relation Age of Onset   Lymphoma Mother        non-hodgkins - died @ 41   Coronary artery disease Mother    Coronary artery disease Father        CABG in his 13s, died @ 51   Diabetes Father    Healthy Brother    Hypertension Sister    Arthritis Sister    IgA nephropathy Daughter    Kidney failure Daughter    Celiac disease Daughter    Breast cancer Maternal Aunt    Colon cancer Neg Hx    Esophageal cancer Neg Hx    Stomach cancer Neg Hx    Rectal cancer Neg Hx    Ovarian cancer Neg Hx    Cervical cancer Neg Hx     ROS: no fevers or chills, productive cough, hemoptysis, dysphasia, odynophagia, melena, hematochezia, dysuria, hematuria, rash, seizure activity, orthopnea, PND, claudication. Remaining systems are negative.  Physical Exam: Well-developed well-nourished in no acute distress.  Skin is warm and dry.  HEENT is normal.  Neck is supple.  Chest is clear to auscultation with normal expansion.  Cardiovascular exam is regular rate and rhythm.  Abdominal exam positive ascites Extremities show trace edema. neuro grossly intact   A/P  1 volume excess-felt predominantly secondary to cirrhosis. Continue diuretics at present dose.   2 history of palpitations/nonsustained ventricular tachycardia-we will continue nadolol at present dose.  3 hypertension-patient's blood pressure is controlled. Continue present medical regimen.  4 cirrhosis-due to [place].  Kirk Ruths, MD

## 2020-11-04 ENCOUNTER — Ambulatory Visit (INDEPENDENT_AMBULATORY_CARE_PROVIDER_SITE_OTHER): Payer: 59 | Admitting: Cardiology

## 2020-11-04 ENCOUNTER — Encounter: Payer: Self-pay | Admitting: Cardiology

## 2020-11-04 ENCOUNTER — Other Ambulatory Visit: Payer: Self-pay

## 2020-11-04 VITALS — BP 140/82 | HR 70 | Ht 64.0 in | Wt 184.0 lb

## 2020-11-04 DIAGNOSIS — K7460 Unspecified cirrhosis of liver: Secondary | ICD-10-CM | POA: Diagnosis not present

## 2020-11-04 DIAGNOSIS — I10 Essential (primary) hypertension: Secondary | ICD-10-CM | POA: Diagnosis not present

## 2020-11-04 NOTE — Patient Instructions (Signed)

## 2020-11-06 ENCOUNTER — Encounter: Payer: Self-pay | Admitting: Obstetrics and Gynecology

## 2020-11-06 ENCOUNTER — Other Ambulatory Visit: Payer: Self-pay

## 2020-11-06 ENCOUNTER — Telehealth (INDEPENDENT_AMBULATORY_CARE_PROVIDER_SITE_OTHER): Payer: 59 | Admitting: Obstetrics and Gynecology

## 2020-11-06 DIAGNOSIS — R9389 Abnormal findings on diagnostic imaging of other specified body structures: Secondary | ICD-10-CM

## 2020-11-06 DIAGNOSIS — N83201 Unspecified ovarian cyst, right side: Secondary | ICD-10-CM

## 2020-11-06 NOTE — Progress Notes (Signed)
Aasha Dina 1956/06/26 [id number]  SUBJECTIVE:  64 y.o. G3P3003 female presents for telephone call for preoperative discussion and planning for a hysteroscopy D&C for thickened endometrium, postmenopausal bleeding, insufficient office biopsy with suspected polyp.  She was identified by name and DOB.   She also has a 6.6 cm right ovarian cyst with a significantly elevated CA-125 disease in the setting of nonalcoholic steatohepatitis.  We did send her for consultation with gynecologic oncology and Dr. Denman George recommended ultrasound surveillance with low suspicion for malignancy.  CEA and inhibin B were both normal.  Patient is not having any abdominal pain or bloating.   Current Outpatient Medications  Medication Sig Dispense Refill  . baclofen (LIORESAL) 10 MG tablet Take 10 mg by mouth 2 (two) times daily as needed.    . diphenoxylate-atropine (LOMOTIL) 2.5-0.025 MG tablet 1-2 tabs every 6 hours as needed 60 tablet 2  . furosemide (LASIX) 40 MG tablet TAKE 1 TABLET DAILY 90 tablet 3  . lactulose (CHRONULAC) 10 GM/15ML solution Take 10 g by mouth daily as needed.    . nadolol (CORGARD) 20 MG tablet Take 0.5-1 tablets (10-20 mg total) by mouth daily as needed. 90 tablet 1  . omeprazole (PRILOSEC) 40 MG capsule Take by mouth daily.    . potassium chloride SA (KLOR-CON) 20 MEQ tablet Take 1 tablet (20 mEq total) by mouth daily. 90 tablet 3  . rifaximin (XIFAXAN) 550 MG TABS tablet Take 1 tablet (550 mg total) by mouth 2 (two) times daily. 60 tablet 5  . spironolactone (ALDACTONE) 100 MG tablet TAKE 1 TABLET DAILY 90 tablet 3  . promethazine (PHENERGAN) 25 MG tablet Take 1 tablet (25 mg total) by mouth every 6 (six) hours as needed for nausea or vomiting. (Patient not taking: Reported on 11/06/2020) 30 tablet 1  . traMADol (ULTRAM) 50 MG tablet Take 50 mg by mouth 2 (two) times daily as needed.  (Patient not taking: Reported on 11/06/2020)     No current facility-administered medications  for this visit.   Allergies: Patient has no known allergies.  No LMP recorded. Patient is postmenopausal.  Past medical history,surgical history, problem list, medications, allergies, family history and social history were all reviewed and documented as reviewed in the EPIC chart.   OBJECTIVE:  There were no vitals taken for this visit. No examination due to the telephone encounter   ASSESSMENT:  64 y.o. [id number] presenting for preoperative discussion and follow-up recommendations regarding a right ovarian cyst  PLAN:  I recommend proceeding with hysteroscopy, dilation and curettage, possible MyoSure polypectomy/resection of endometrial mass. I discussed the same-day outpatient intent of the procedure, and risks of infection, bleeding, possible need for blood transfusion, perforation of uterus and/or cervix resulting in injury to surrounding organ structures including major pelvic blood vessels, bowel, bladder, and potentially ureter, and DVT.  Anesthesia will either be per anesthesiologist's choice, and inherent risks with being placed under anesthesia include myocardial infarction, stroke, rarely death.  Very rarely would laparoscopy and/or laparotomy be indicated to tend to any intra-abdominal hemorrhage and/or injury concern.  Postoperative recovery expectations of needing a few days away from employment duties in the absence of any complication were also discussed.  We will avoid NSAIDs for postoperative cramping due to her impaired kidney function, and she will stick to an acetaminophen regimen only.  She also has some tramadol on hand for chronic back pain, so she could use that for any more severe uterine cramping after the procedure.  All questions  were answered.  I will have staff reach out to her to help schedule the procedure. We discussed the follow-up of the ovarian cyst will consist of a pelvic ultrasound in 6 months around March 2022 and then if all is stable then we will continue  to monitor with annual ultrasounds after that.  CA-125 will not be a good tumor marker for this patient because of her other medical conditions.     [name] MD 11/06/20

## 2020-11-07 ENCOUNTER — Telehealth: Payer: Self-pay

## 2020-11-07 NOTE — Telephone Encounter (Signed)
If her GI is willing to medically clear her that is just fine. Thank you.

## 2020-11-07 NOTE — Telephone Encounter (Signed)
I called patient to find out PCP name for pre op clearance for surgery. She sees Bradly Bienenstock, NP at [facility].  She said that she recently saw her cardiologist. She said Dr. Carlean Purl, her GI MD is a lot more familiar with her co-morbidities than her PCP.  I told her I will let Dr. Delilah Shan know that and see what he recommends and I will take care of getting clearance form to them and following up. I will call her to schedule once I receive medical clearance.  Dr. Lucilla Edin, still PCP for medical clearance?

## 2020-11-07 NOTE — Telephone Encounter (Signed)
Med Clearance form faxed to Dr. Carlean Purl to see if he will clear her for surgery.

## 2020-11-12 ENCOUNTER — Telehealth: Payer: Self-pay | Admitting: Internal Medicine

## 2020-11-12 NOTE — Telephone Encounter (Signed)
Received a request from Dr. Delilah Shan of gynecology to clear patient for proposed [facility] and hysteroscopy.  I suspect that that will be okay to do but I would like to examine the patient and draw some more lab so I have asked her to come for an appointment.  We will try to add her on Monday, November 30 at 3:50 PM or perhaps Friday of the same week at 3:50 PM if the Monday does not work.  She thinks she can make the Monday appointment work.

## 2020-11-13 NOTE — Telephone Encounter (Signed)
I have added Dawn Moon on to November 29th schedule.

## 2020-11-13 NOTE — Telephone Encounter (Signed)
Thank you, Dr. Carlean Purl.

## 2020-11-18 ENCOUNTER — Encounter: Payer: Self-pay | Admitting: Internal Medicine

## 2020-11-18 ENCOUNTER — Other Ambulatory Visit (INDEPENDENT_AMBULATORY_CARE_PROVIDER_SITE_OTHER): Payer: 59

## 2020-11-18 ENCOUNTER — Ambulatory Visit (INDEPENDENT_AMBULATORY_CARE_PROVIDER_SITE_OTHER): Payer: 59 | Admitting: Internal Medicine

## 2020-11-18 VITALS — BP 110/78 | HR 56 | Ht 64.0 in | Wt 179.0 lb

## 2020-11-18 DIAGNOSIS — K7581 Nonalcoholic steatohepatitis (NASH): Secondary | ICD-10-CM

## 2020-11-18 DIAGNOSIS — R935 Abnormal findings on diagnostic imaging of other abdominal regions, including retroperitoneum: Secondary | ICD-10-CM | POA: Diagnosis not present

## 2020-11-18 DIAGNOSIS — K7469 Other cirrhosis of liver: Secondary | ICD-10-CM

## 2020-11-18 DIAGNOSIS — N9489 Other specified conditions associated with female genital organs and menstrual cycle: Secondary | ICD-10-CM

## 2020-11-18 DIAGNOSIS — K7460 Unspecified cirrhosis of liver: Secondary | ICD-10-CM

## 2020-11-18 DIAGNOSIS — Z01818 Encounter for other preprocedural examination: Secondary | ICD-10-CM

## 2020-11-18 LAB — CBC WITH DIFFERENTIAL/PLATELET
Basophils Absolute: 0.1 10*3/uL (ref 0.0–0.1)
Basophils Relative: 0.8 % (ref 0.0–3.0)
Eosinophils Absolute: 0.2 10*3/uL (ref 0.0–0.7)
Eosinophils Relative: 2.1 % (ref 0.0–5.0)
HCT: 42.6 % (ref 36.0–46.0)
Hemoglobin: 14.3 g/dL (ref 12.0–15.0)
Lymphocytes Relative: 23.2 % (ref 12.0–46.0)
Lymphs Abs: 1.8 10*3/uL (ref 0.7–4.0)
MCHC: 33.6 g/dL (ref 30.0–36.0)
MCV: 92.1 fl (ref 78.0–100.0)
Monocytes Absolute: 0.8 10*3/uL (ref 0.1–1.0)
Monocytes Relative: 9.9 % (ref 3.0–12.0)
Neutro Abs: 5 10*3/uL (ref 1.4–7.7)
Neutrophils Relative %: 64 % (ref 43.0–77.0)
Platelets: 138 10*3/uL — ABNORMAL LOW (ref 150.0–400.0)
RBC: 4.63 Mil/uL (ref 3.87–5.11)
RDW: 15.1 % (ref 11.5–15.5)
WBC: 7.7 10*3/uL (ref 4.0–10.5)

## 2020-11-18 LAB — COMPREHENSIVE METABOLIC PANEL
ALT: 25 U/L (ref 0–35)
AST: 49 U/L — ABNORMAL HIGH (ref 0–37)
Albumin: 3.6 g/dL (ref 3.5–5.2)
Alkaline Phosphatase: 117 U/L (ref 39–117)
BUN: 12 mg/dL (ref 6–23)
CO2: 24 mEq/L (ref 19–32)
Calcium: 9.6 mg/dL (ref 8.4–10.5)
Chloride: 103 mEq/L (ref 96–112)
Creatinine, Ser: 1.23 mg/dL — ABNORMAL HIGH (ref 0.40–1.20)
GFR: 46.54 mL/min — ABNORMAL LOW (ref >60.00)
Glucose, Bld: 86 mg/dL (ref 70–99)
Potassium: 4.4 mEq/L (ref 3.5–5.1)
Sodium: 135 mEq/L (ref 135–145)
Total Bilirubin: 2.4 mg/dL — ABNORMAL HIGH (ref 0.2–1.2)
Total Protein: 7.7 g/dL (ref 6.0–8.3)

## 2020-11-18 LAB — PROTIME-INR
INR: 1.2 ratio — ABNORMAL HIGH (ref 0.8–1.0)
Prothrombin Time: 13.3 s — ABNORMAL HIGH (ref 9.6–13.1)

## 2020-11-18 NOTE — Patient Instructions (Signed)
Your provider has requested that you go to the basement level for lab work before leaving today. Press "B" on the elevator. The lab is located at the first door on the left as you exit the elevator.  We will call you with results and plans.  Dr Carlean Purl is going to discuss your case with Dr Delilah Shan.   I appreciate the opportunity to care for you. Silvano Rusk, MD, [facility]

## 2020-11-18 NOTE — Assessment & Plan Note (Signed)
Meld score is 19 child's B still I think.  I think she is an acceptable operative risk for the planned procedure.  She is at increased risk of complications and issues with anesthesia but I think it would be tolerable and will be important to know if there is malignancy present.  Fortunately the adnexal mass is thought to be benign and can be followed as laparotomy or laparoscopy would be a more difficult procedure to tolerate in her case.  I do want to see what her labs are to provide further guidance.  Bleeding risk may be slightly increased but acceptable in her but as long as she has a reasonable platelet count and INR. I doubt any perioperative medications or transfusions would be necessary.  Further recommendations pending lab review.

## 2020-11-18 NOTE — Progress Notes (Signed)
Dawn Moon 64 y.o. Dec 19, 1956 [id number]  Assessment & Plan:   Encounter Diagnoses  Name Primary?  . Liver cirrhosis secondary to NASH ([place]) Yes  . Adnexal mass -suspect benign, for observation   . Abnormal endometrial ultrasound   . Preoperative exam for gynecologic surgery    Liver cirrhosis secondary to NASH ([place]) Meld score is 19 child's B still I think.  I think she is an acceptable operative risk for the planned procedure.  She is at increased risk of complications and issues with anesthesia but I think it would be tolerable and will be important to know if there is malignancy present.  Fortunately the adnexal mass is thought to be benign and can be followed as laparotomy or laparoscopy would be a more difficult procedure to tolerate in her case.  I do want to see what her labs are to provide further guidance.  Bleeding risk may be slightly increased but acceptable in her but as long as she has a reasonable platelet count and INR. I doubt any perioperative medications or transfusions would be necessary.  Further recommendations pending lab review.   I appreciate the opportunity to care for this patient. CC: [name], NP Dr. [name]  This SmartLink has not been configured with any valid records.   cbc Lab Results  Component Value Date   INR 1.2 (H) 11/18/2020   INR 1.2 (H) 09/19/2020   INR 1.2 (H) 02/23/2020   Lab Results  Component Value Date   ALT 25 11/18/2020   AST 49 (H) 11/18/2020   ALKPHOS 117 11/18/2020   BILITOT 2.4 (H) 11/18/2020   Lab Results  Component Value Date   CREATININE 1.23 (H) 11/18/2020   BUN 12 11/18/2020   NA 135 11/18/2020   K 4.4 11/18/2020   CL 103 11/18/2020   CO2 24 11/18/2020    Acceptable to proceed with planned surgery/  Gatha Mayer, MD, [facility]   Subjective:   Chief Complaint: Cirrhosis, preop clearance  HPI Dawn Moon is a 64 year old white woman with [name] related cirrhosis and history of  ascites with hepatic hydrothorax  and hepatic encephalopathy who was found to have an adnexal mass and a thickened endometrium recently.  This was discovered as part of evaluation for recurrent right pleural effusion that was treated with increased diuretics, successfully, in early to mid September.  Gynecology referral was made.  She saw Dr. Delilah Shan and then was referred to Dr. Denman George of GYN oncology.  CA-125 was elevated.  Dr. Denman George thought the CA-125 elevation was related to cirrhosis, and that imaging from both CT scan and ultrasound of this year indicated low suspicion of malignancy given the cystic lesion in the right ovary.  Observation was recommended.  She is here now for preoperative clearance prior to Dr. Delilah Shan performing hysteroscopy and D&C.  She has chronic dyspnea on exertion which seems to be unchanged.  CT scan of the chest as part of her other work-up showed a small pulmonary nodule but no effusions as she has had in the past.  She says her weight is going down some.   Wt Readings from Last 3 Encounters:  11/18/20 179 lb (81.2 kg)  11/04/20 184 lb (83.5 kg)  10/25/20 182 lb (82.6 kg)     Current Meds  Medication Sig  . baclofen (LIORESAL) 10 MG tablet Take 10 mg by mouth 2 (two) times daily as needed.  . diphenoxylate-atropine (LOMOTIL) 2.5-0.025 MG tablet 1-2 tabs every 6 hours as  needed  . furosemide (LASIX) 40 MG tablet TAKE 1 TABLET DAILY  . lactulose (CHRONULAC) 10 GM/15ML solution Take 10 g by mouth daily as needed.  . nadolol (CORGARD) 20 MG tablet Take 0.5-1 tablets (10-20 mg total) by mouth daily as needed. (Patient taking differently: Take 20 mg by mouth daily. )  . omeprazole (PRILOSEC) 40 MG capsule Take 40 mg by mouth daily.   . potassium chloride SA (KLOR-CON) 20 MEQ tablet Take 1 tablet (20 mEq total) by mouth daily.  . promethazine (PHENERGAN) 25 MG tablet Take 1 tablet (25 mg total) by mouth every 6 (six) hours as needed for nausea or vomiting.  . rifaximin  (XIFAXAN) 550 MG TABS tablet Take 1 tablet (550 mg total) by mouth 2 (two) times daily.  Marland Kitchen spironolactone (ALDACTONE) 100 MG tablet TAKE 1 TABLET DAILY  . traMADol (ULTRAM) 50 MG tablet Take 50 mg by mouth 2 (two) times daily as needed.    Past Medical History:  Diagnosis Date  . Ascites   . Encephalopathy, hepatic ([place]) 05/13/2018  . GERD (gastroesophageal reflux disease)   . History of exercise stress test    03-07-2010  Stress echo--- no arrhythmias or conduction abnormalilites and negative for ischemia or chest pain  . History of kidney stones   . History of paroxysmal supraventricular tachycardia    episode 2011  consult w/ dr Caryl Comes --  put on atenolol--  per pt no longer an issue  . HTN (hypertension)    cardiologist --  dr Stanford Breed  . IBS (irritable bowel syndrome)    diarrhea  . Kidney stones    kidney infections as well  . Nonalcoholic steatohepatitis (NASH)   . Numbness and tingling of left lower extremity    post achilles tendon repair  . OA (osteoarthritis)    knees  . Pleural effusion on right    hepatic hydrothorax  . PONV (postoperative nausea and vomiting)   . Recurrent UTI 06/12/2019  . Secondary esophageal varices without bleeding ([place]) 06/03/2018  . Vitamin D deficiency   . Wears glasses    Past Surgical History:  Procedure Laterality Date  . ACHILLES TENDON SURGERY Left 06/2016  . BREAST BIOPSY Left 02/2016   benign  . COLONOSCOPY  2005  . CT CTA CORONARY W/CA SCORE W/CM &/OR WO/CM  01/01/2014   non-obstructive calcified plaque in pLAD (0-25%), no significant incidental noncardiac findings noted  . ESOPHAGOGASTRODUODENOSCOPY  01/2014  . EXTRACORPOREAL SHOCK WAVE LITHOTRIPSY  2010  . KNEE ARTHROPLASTY Right 03/24/2018   Procedure: RIGHT TOTAL KNEE ARTHROPLASTY WITH COMPUTER NAVIGATION;  Surgeon: Rod Can, MD;  Location: [facility];  Service: Orthopedics;  Laterality: Right;  Needs RNFA  . KNEE ARTHROSCOPY Right 12/04/2016   Procedure: ARTHROSCOPY KNEE  WITH PARTIAL MEDIAL MENISCECTOMY;  Surgeon: Rod Can, MD;  Location: [place];  Service: Orthopedics;  Laterality: Right;  . LEFT HEART CATHETERIZATION WITH CORONARY ANGIOGRAM N/A 01/11/2012   Procedure: LEFT HEART CATHETERIZATION WITH CORONARY ANGIOGRAM;  Surgeon: Sherren Mocha, MD;  Location[facility] C[facility] LAB;  Service: Cardiovascular;  Laterality: N/A;  widely patent coronary arterires without significant obstructive CAD,  normal LVF, ef 55-56%  . TONSILLECTOMY AND ADENOIDECTOMY  child  . TRANSTHORACIC ECHOCARDIOGRAM  01/18/2012   mild LVH,  ef 60%/  trivial TR  . TUBAL LIGATION  1985  . 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  Has to walk up stairs to work and can do w/o Ss.  Lives @ home in [place] with family.   family history includes Arthritis in her sister; Breast cancer in her maternal aunt; Celiac disease in her daughter; Coronary artery disease in her father and mother; Diabetes in her father; Healthy in her brother; Hypertension in her sister; IgA nephropathy in her daughter; Kidney failure in her daughter; Lymphoma in her mother.   Review of Systems As per HPI  Objective:   Physical Exam BP 110/78 (BP Location: Left Arm, Patient Position: Sitting, Cuff Size: Normal)   Pulse (!) 56   Ht 5' 4"  (1.626 m)   Wt 179 lb (81.2 kg)   BMI 30.73 kg/m  Obese ww NAD Ayes anicteric Lungs cta throughout Cor NL s1 s2 no rmg abd obese, mildly tender RUQ o/w nontender no masses Neuro - alert and oriented and no asterixis Ext no pitting edema

## 2020-11-19 ENCOUNTER — Encounter: Payer: Self-pay | Admitting: Obstetrics and Gynecology

## 2020-11-19 NOTE — Telephone Encounter (Signed)
Dr Oletta Darter can read Dr. Celesta Aver office note in her chart. She went for medical clearance. He stated:  "I think she is an acceptable operative risk for the planned procedure.  She is at increased risk of complications and issues with anesthesia but I think it would be tolerable and will be important to know if there is malignancy present.  Fortunately the adnexal mass is thought to be benign and can be followed as laparotomy or laparoscopy would be a more difficult procedure to tolerate in her case.  I do want to see what her labs are to provide further guidance.  Bleeding risk may be slightly increased but acceptable in her but as long as she has a reasonable platelet count and INR. I doubt any perioperative medications or transfusions would be necessary.  Further recommendations pending lab review."  Are you comfortable with me proceeding to schedule surgery for her for D&C Hyst, poss polypectomy?

## 2020-11-19 NOTE — Telephone Encounter (Signed)
Yes, please. Thank you.

## 2020-11-20 NOTE — Progress Notes (Signed)
Thank you for your assistance, Dr. Carlean Purl. Harrie Foreman MD

## 2020-11-20 NOTE — Progress Notes (Signed)
Labs reviewed.  Platelets and coags are acceptable for planned hysteroscopy and dilatation and curettage.  Stable chronic kidney disease noted as well.  I believe it is appropriate and reasonable to proceed with the planned surgery.  Gatha Mayer, MD, [facility]

## 2020-11-25 ENCOUNTER — Encounter (HOSPITAL_BASED_OUTPATIENT_CLINIC_OR_DEPARTMENT_OTHER): Payer: Self-pay | Admitting: Obstetrics and Gynecology

## 2020-11-25 ENCOUNTER — Other Ambulatory Visit: Payer: Self-pay

## 2020-11-25 NOTE — Progress Notes (Signed)
Spoke w/ via phone for pre-op interview---pt Lab needs dos----   Ask anesthesia mda if need to repeat cbc and cmet it was done 11-18-2020,needs t & s          Lab results-----cbc with dif, cmet pt done 11-18-2020 epic- COVID test ------11-28-2020 1455 Arrive at -------700 am 12-02-2020 NPO after MN, NO Solid Food.  Clear liquids from MN until---600 am then npo Medications to take morning of surgery -----nadolol, omeprazole, rifaximin Diabetic medication -----n/a Patient Special Instructions -----none Pre-Op special Istructions -----none Patient verbalized understanding of instructions that were given at this phone interview. Patient denies shortness of breath, chest pain, fever, cough at this phone interview.  Anesthesia: nash htn  GI clearance Dawn Moon 11-18-2020 epic  PCP: Dawn Moon Cardiologist :Dawn Moon 11-04-2020 epic Chest x-ray :09-02-2020 care everywhere, 09-06-2020 epic, ct abdomen chest pelvis 09-19-2020 epic EKG :06-28-2020 epic Echo :07-05-2020 epic Stress test:none Cardiac Cath : 12-2011 epic Activity level: does own housework and can climb steps without sob, pt states cough from 09-09-2020 resolved and no sob Sleep Study/ CPAP :n/a Fasting Blood Sugar :      / Checks Blood Sugar -- times a day:  n/a Blood Thinner/ Instructions /Last Dose:n/a ASA / Instructions/ Last Dose : n/a

## 2020-11-28 ENCOUNTER — Other Ambulatory Visit (HOSPITAL_COMMUNITY)
Admission: RE | Admit: 2020-11-28 | Discharge: 2020-11-28 | Disposition: A | Discharge status: Home / Self Care | Payer: 59 | Source: Ambulatory Visit | Attending: Obstetrics and Gynecology | Admitting: Obstetrics and Gynecology

## 2020-11-28 DIAGNOSIS — Z20822 Contact with and (suspected) exposure to covid-19: Secondary | ICD-10-CM | POA: Diagnosis not present

## 2020-11-28 DIAGNOSIS — Z01812 Encounter for preprocedural laboratory examination: Secondary | ICD-10-CM | POA: Insufficient documentation

## 2020-11-28 LAB — SARS CORONAVIRUS 2 (TAT 6-24 HRS): SARS Coronavirus 2: NEGATIVE

## 2020-11-29 ENCOUNTER — Encounter (HOSPITAL_BASED_OUTPATIENT_CLINIC_OR_DEPARTMENT_OTHER): Payer: Self-pay | Admitting: Obstetrics and Gynecology

## 2020-12-02 ENCOUNTER — Other Ambulatory Visit: Payer: Self-pay

## 2020-12-02 ENCOUNTER — Ambulatory Visit (HOSPITAL_BASED_OUTPATIENT_CLINIC_OR_DEPARTMENT_OTHER): Payer: 59 | Admitting: Anesthesiology

## 2020-12-02 ENCOUNTER — Encounter (HOSPITAL_BASED_OUTPATIENT_CLINIC_OR_DEPARTMENT_OTHER): Payer: Self-pay | Admitting: Obstetrics and Gynecology

## 2020-12-02 ENCOUNTER — Encounter (HOSPITAL_BASED_OUTPATIENT_CLINIC_OR_DEPARTMENT_OTHER)
Admission: RE | Disposition: A | Discharge status: Home / Self Care | Payer: Self-pay | Source: Home / Self Care | Attending: Obstetrics and Gynecology

## 2020-12-02 ENCOUNTER — Ambulatory Visit (HOSPITAL_BASED_OUTPATIENT_CLINIC_OR_DEPARTMENT_OTHER)
Admission: RE | Admit: 2020-12-02 | Discharge: 2020-12-02 | Disposition: A | Discharge status: Home / Self Care | Payer: 59 | Attending: Obstetrics and Gynecology | Admitting: Obstetrics and Gynecology

## 2020-12-02 DIAGNOSIS — N840 Polyp of corpus uteri: Secondary | ICD-10-CM | POA: Diagnosis not present

## 2020-12-02 DIAGNOSIS — Z96651 Presence of right artificial knee joint: Secondary | ICD-10-CM | POA: Insufficient documentation

## 2020-12-02 DIAGNOSIS — N950 Postmenopausal bleeding: Secondary | ICD-10-CM | POA: Diagnosis present

## 2020-12-02 DIAGNOSIS — Z79899 Other long term (current) drug therapy: Secondary | ICD-10-CM | POA: Insufficient documentation

## 2020-12-02 DIAGNOSIS — R9389 Abnormal findings on diagnostic imaging of other specified body structures: Secondary | ICD-10-CM | POA: Diagnosis not present

## 2020-12-02 HISTORY — DX: Postmenopausal bleeding: N95.0

## 2020-12-02 HISTORY — PX: DILATATION & CURETTAGE/HYSTEROSCOPY WITH MYOSURE: SHX6511

## 2020-12-02 HISTORY — DX: Cardiac arrhythmia, unspecified: I49.9

## 2020-12-02 LAB — CBC
HCT: 39.2 % (ref 36.0–46.0)
Hemoglobin: 13.4 g/dL (ref 12.0–15.0)
MCH: 31.3 pg (ref 26.0–34.0)
MCHC: 34.2 g/dL (ref 30.0–36.0)
MCV: 91.6 fL (ref 80.0–100.0)
Platelets: 148 10*3/uL — ABNORMAL LOW (ref 150–400)
RBC: 4.28 MIL/uL (ref 3.87–5.11)
RDW: 15.8 % — ABNORMAL HIGH (ref 11.5–15.5)
WBC: 6.8 10*3/uL (ref 4.0–10.5)
nRBC: 0 % (ref 0.0–0.2)

## 2020-12-02 LAB — TYPE AND SCREEN
ABO/RH(D): B POS
Antibody Screen: NEGATIVE

## 2020-12-02 SURGERY — DILATATION & CURETTAGE/HYSTEROSCOPY WITH MYOSURE
Anesthesia: General | Site: Vagina

## 2020-12-02 MED ORDER — PROPOFOL 10 MG/ML IV BOLUS
INTRAVENOUS | Status: DC | PRN
  Administered 2020-12-02: 150 mg via INTRAVENOUS

## 2020-12-02 MED ORDER — SCOPOLAMINE 1 MG/3DAYS TD PT72
1.0000 | MEDICATED_PATCH | TRANSDERMAL | Status: DC
  Administered 2020-12-02: 07:00:00 1.5 mg via TRANSDERMAL

## 2020-12-02 MED ORDER — KETOROLAC TROMETHAMINE 30 MG/ML IJ SOLN
INTRAMUSCULAR | Status: DC | PRN
  Administered 2020-12-02: 30 mg via INTRAVENOUS

## 2020-12-02 MED ORDER — DEXAMETHASONE SODIUM PHOSPHATE 10 MG/ML IJ SOLN
INTRAMUSCULAR | Status: AC
  Filled 2020-12-02: qty 1

## 2020-12-02 MED ORDER — LACTATED RINGERS IV SOLN: INTRAVENOUS | Status: DC

## 2020-12-02 MED ORDER — ONDANSETRON HCL 4 MG/2ML IJ SOLN
INTRAMUSCULAR | Status: AC
  Filled 2020-12-02: qty 2

## 2020-12-02 MED ORDER — FENTANYL CITRATE (PF) 250 MCG/5ML IJ SOLN
INTRAMUSCULAR | Status: AC
  Filled 2020-12-02: qty 5

## 2020-12-02 MED ORDER — KETOROLAC TROMETHAMINE 30 MG/ML IJ SOLN
INTRAMUSCULAR | Status: AC
  Filled 2020-12-02: qty 1

## 2020-12-02 MED ORDER — POVIDONE-IODINE 10 % EX SWAB: 2.0000 "application " | Freq: Once | CUTANEOUS | Status: DC

## 2020-12-02 MED ORDER — FENTANYL CITRATE (PF) 100 MCG/2ML IJ SOLN
INTRAMUSCULAR | Status: DC | PRN
  Administered 2020-12-02 (×2): 50 ug via INTRAVENOUS

## 2020-12-02 MED ORDER — BUPIVACAINE HCL (PF) 0.25 % IJ SOLN
INTRAMUSCULAR | Status: DC | PRN
  Administered 2020-12-02: 10 mL

## 2020-12-02 MED ORDER — SCOPOLAMINE 1 MG/3DAYS TD PT72
MEDICATED_PATCH | TRANSDERMAL | Status: AC
  Filled 2020-12-02: qty 1

## 2020-12-02 MED ORDER — SODIUM CHLORIDE 0.9 % IR SOLN
Status: DC | PRN
  Administered 2020-12-02: 500 mL

## 2020-12-02 MED ORDER — LIDOCAINE 2% (20 MG/ML) 5 ML SYRINGE
INTRAMUSCULAR | Status: DC | PRN
  Administered 2020-12-02: 80 mg via INTRAVENOUS

## 2020-12-02 MED ORDER — MIDAZOLAM HCL 2 MG/2ML IJ SOLN
INTRAMUSCULAR | Status: AC
  Filled 2020-12-02: qty 2

## 2020-12-02 MED ORDER — MIDAZOLAM HCL 5 MG/5ML IJ SOLN
INTRAMUSCULAR | Status: DC | PRN
  Administered 2020-12-02: 2 mg via INTRAVENOUS

## 2020-12-02 MED ORDER — SODIUM CHLORIDE 0.9% FLUSH: 3.0000 mL | Freq: Two times a day (BID) | INTRAVENOUS | Status: DC

## 2020-12-02 MED ORDER — ONDANSETRON HCL 4 MG/2ML IJ SOLN
INTRAMUSCULAR | Status: DC | PRN
  Administered 2020-12-02: 4 mg via INTRAVENOUS

## 2020-12-02 MED ORDER — PROPOFOL 10 MG/ML IV BOLUS
INTRAVENOUS | Status: AC
  Filled 2020-12-02: qty 20

## 2020-12-02 MED ORDER — ACETAMINOPHEN 500 MG PO TABS: 1000.0000 mg | ORAL_TABLET | Freq: Four times a day (QID) | ORAL | Status: AC | PRN

## 2020-12-02 MED ORDER — DEXAMETHASONE SODIUM PHOSPHATE 10 MG/ML IJ SOLN
INTRAMUSCULAR | Status: DC | PRN
  Administered 2020-12-02: 10 mg via INTRAVENOUS

## 2020-12-02 MED ORDER — SILVER NITRATE-POT NITRATE 75-25 % EX MISC
CUTANEOUS | Status: DC | PRN
  Administered 2020-12-02: 1

## 2020-12-02 SURGICAL SUPPLY — 31 items
CATH ROBINSON RED A/P 16FR (CATHETERS) ×1 IMPLANT
COVER WAND RF STERILE (DRAPES) ×2 IMPLANT
DECANTER SPIKE VIAL GLASS SM (MISCELLANEOUS) ×1 IMPLANT
DEVICE MYOSURE LITE (MISCELLANEOUS) IMPLANT
DEVICE MYOSURE REACH (MISCELLANEOUS) IMPLANT
DILATOR CANAL MILEX (MISCELLANEOUS) IMPLANT
ELECT PENCIL ROCKER SW 15FT (MISCELLANEOUS) ×1 IMPLANT
ELECT REM PT RETURN 9FT ADLT (ELECTROSURGICAL) ×2
ELECTRODE REM PT RTRN 9FT ADLT (ELECTROSURGICAL) ×1 IMPLANT
GAUZE 4X4 16PLY RFD (DISPOSABLE) IMPLANT
GLOVE BIO SURGEON STRL SZ7 (GLOVE) ×1 IMPLANT
GLOVE BIO SURGEON STRL SZ8 (GLOVE) ×2 IMPLANT
GLOVE BIOGEL PI IND STRL 7.5 (GLOVE) IMPLANT
GLOVE BIOGEL PI IND STRL 8 (GLOVE) IMPLANT
GLOVE BIOGEL PI INDICATOR 7.5 (GLOVE) ×1
GLOVE BIOGEL PI INDICATOR 8 (GLOVE)
GLOVE SURG SS PI 7.0 STRL IVOR (GLOVE) ×2 IMPLANT
GOWN STRL REUS W/TWL LRG LVL3 (GOWN DISPOSABLE) ×2 IMPLANT
GOWN STRL REUS W/TWL XL LVL3 (GOWN DISPOSABLE) ×2 IMPLANT
IV NS IRRIG 3000ML ARTHROMATIC (IV SOLUTION) ×3 IMPLANT
KIT PROCEDURE FLUENT (KITS) ×2 IMPLANT
KIT TURNOVER CYSTO (KITS) ×2 IMPLANT
MYOSURE XL FIBROID (MISCELLANEOUS)
PACK VAGINAL MINOR WOMEN LF (CUSTOM PROCEDURE TRAY) ×2 IMPLANT
PAD OB MATERNITY 4.3X12.25 (PERSONAL CARE ITEMS) ×2 IMPLANT
PAD PREP 24X48 CUFFED NSTRL (MISCELLANEOUS) ×2 IMPLANT
SEAL CERVICAL OMNI LOK (ABLATOR) IMPLANT
SEAL ROD LENS SCOPE MYOSURE (ABLATOR) ×2 IMPLANT
SUT VIC AB 0 CT1 36 (SUTURE) ×2 IMPLANT
SYR CONTROL 10ML LL (SYRINGE) ×1 IMPLANT
SYSTEM TISS REMOVAL MYOSURE XL (MISCELLANEOUS) IMPLANT

## 2020-12-02 NOTE — Anesthesia Procedure Notes (Signed)
Procedure Name: LMA Insertion Date/Time: 12/02/2020 8:44 AM Performed by: Rogers Blocker, CRNA Pre-anesthesia Checklist: Patient identified, Emergency Drugs available, Suction available and Patient being monitored Patient Re-evaluated:Patient Re-evaluated prior to induction Oxygen Delivery Method: Circle System Utilized Preoxygenation: Pre-oxygenation with 100% oxygen Induction Type: IV induction Ventilation: Mask ventilation without difficulty LMA: LMA inserted LMA Size: 4.0 Number of attempts: 1 Placement Confirmation: positive ETCO2 Tube secured with: Tape Dental Injury: Teeth and Oropharynx as per pre-operative assessment

## 2020-12-02 NOTE — Op Note (Signed)
Name: Dawn Moon  Age: 64 y.o.  Date of Birth: 05-08-1956 Medical Record #: [id number]   Operative Note   Preoperative Diagnosis: Postmenopausal bleeding, endometrial thickening and insufficient office biopsy with suspected endometrial polyp Procedure: Hysteroscopy, Dilatation and Curettage, Polypectomy, Excision of Vaginal Lesion Postoperative Diagnosis: same and confirmed endometrial polyp, vaginal lesion Surgeon: [name], MD Estimated Blood Loss: 5 mL Anesthesia: General LMA, local with 0.25% bupivacaine Specimens: Endometrial curettings with polyp, vaginal lesion Findings: Normal atropic endometrial cavity with visualization of bilateral tubal ostia.  Normal cavity contour without submucosal fibroids.  <1 cm benign appearing polyp attached in the right cornua region in the endometrial cavity.  Endocervical canal normal. Uterus sounds to 11 cm.  Uterus feels normal in size and contour, mobile, midplane.  Hysteroscopic fluid deficit 85 mL.  5-6 mm vaginal lesion with condylomatous appearance is present on posterior vaginal epithelium near to the cervix. Complication: none. Date: 12/02/20     DESCRIPTION OF PROCEDURE:      Preoperative review of the procedure was completed with the patient prior to transport to the operating room including risks, benefits, and alternatives.  The patient's questions were answered and she agreed to proceed.    Under anesthesia, Dawn Moon was placed in the dorsolithotomy position with legs in yellofin stirrups with SCDs applied and running.  A surgical team time out was performed to verify and agree on procedure and patient consent. A bimanual exam was performed.  The patient was prepped and draped in the usual sterile fashion.      Cervix was visualized with a weighted speculum placed in the posterior vagina and a Sims retractor anteriorly.  A paracervical block was applied in the standard fashion using 0.25% bupivacaine.  The  anterior cervix was grasped with a single-toothed tenaculum.  The uterine descensus was noted to be moderate.  Cervix was gently dilated using a progressive series of Pratt dilators to size 17.  There was no concern for uterine or cervical perforation.  The uterine cavity was very gently sounded to establish a measurement of uterine cavity depth.  The Myosure hysteroscope was introduced and the uterine cavity and ostia were visualized.  Other than the endometrial polyp described above, no submucosal fibroids nor abnormal appearing pathology were encountered.  The cervix was gently dilated a second time to size 19 allow introduction of a small curettage instrument.  A thorough curettage was productive of scant endometrial tissue. The curettage was effective at achieving a uterine cry circumferentially.  The hysteroscope was introduced into the endometrial cavity again and the right cornual polyp was noted to still be intact.  The hysteroscope was removed.  The polyp forceps were gently introduced into the cervix and endometrial cavity and gentle probing of the right cornual region was successful in locating the polyp blindly which was grasped and plucked off of the endometrium without difficulty.  The polyp was removed and sent with the endometrial curettings for pathology analysis.  The hysteroscope was introduced into the endometrial cavity one final time to confirm removal of the polyp and adequate sampling of the endometrial cavity by curettage.   Bleeding was minimal at that point.  The tenaculum was removed from the cervix and the puncture sites were hemostatic after application of silver nitrate cautery.  As the weighted speculum was moved around, there was noted to be a condylomatous appearing lesion on the posterior vaginal epithelium near to the posterior vaginal fornix.  The lesion was grasped and elevated and Mayo  scissors were used to sharply excise the lesion from the vaginal skin.  The vaginal lesion  was collected as a separate specimen and sent to pathology for analysis.  Application of light electrocautery to the area improved hemostasis somewhat, but a superficial figure-of-eight stitch with #0-Vicryl was then placed into the vaginal epithelium at the lesion excision site resulting in excellent hemostasis.    Sponge, instrument, needle counts were correct at the conclusion of the procedure. The patient tolerated the procedure well and was brought to the recovery room in stable condition.      [name], M.D., Cherlynn June

## 2020-12-02 NOTE — Discharge Instructions (Signed)
  Post Anesthesia Home Care Instructions  Activity: Get plenty of rest for the remainder of the day. A responsible individual must stay with you for 24 hours following the procedure.  For the next 24 hours, DO NOT: -Drive a car -Paediatric nurse -Drink alcoholic beverages -Take any medication unless instructed by your physician -Make any legal decisions or sign important papers.  Meals: Start with liquid foods such as gelatin or soup. Progress to regular foods as tolerated. Avoid greasy, spicy, heavy foods. If nausea and/or vomiting occur, drink only clear liquids until the nausea and/or vomiting subsides. Call your physician if vomiting continues.  Special Instructions/Symptoms: Your throat may feel dry or sore from the anesthesia or the breathing tube placed in your throat during surgery. If this causes discomfort, gargle with warm salt water. The discomfort should disappear within 24 hours.  If you had a scopolamine patch placed behind your ear for the management of post- operative nausea and/or vomiting:  1. The medication in the patch is effective for 72 hours, after which it should be removed.  Wrap patch in a tissue and discard in the trash. Wash hands thoroughly with soap and water. 2. You may remove the patch earlier than 72 hours if you experience unpleasant side effects which may include dry mouth, dizziness or visual disturbances. 3. Avoid touching the patch. Wash your hands with soap and water after contact with the patch.    Remove patch behind right ear by Thursday, December 16,2021.  DISCHARGE INSTRUCTIONS: D&C The following instructions have been prepared to help you care for yourself upon your return home.   Personal hygiene: Marland Kitchen Use sanitary pads for vaginal drainage, not tampons. . Shower the day after your procedure. . NO tub baths, pools or Jacuzzis for 2-3 weeks. . Wipe front to back after using the bathroom.  Activity and limitations: . Do NOT drive or  operate any equipment for 24 hours. The effects of anesthesia are still present and drowsiness may result. . Do NOT rest in bed all day. . Walking is encouraged. . Walk up and down stairs slowly. . You may resume your normal activity in one to two days or as indicated by your physician.  Sexual activity: NO intercourse for at least 2 weeks after the procedure, or as indicated by your physician.  Diet: Eat a light meal as desired this evening. You may resume your usual diet tomorrow.  Return to work: You may resume your work activities in one to two days or as indicated by your doctor.  What to expect after your surgery: Expect to have vaginal bleeding/discharge for 2-3 days and spotting for up to 10 days. It is not unusual to have soreness for up to 1-2 weeks. You may have a slight burning sensation when you urinate for the first day. Mild cramps may continue for a couple of days. You may have a regular period in 2-6 weeks.  Call your doctor for any of the following: . Excessive vaginal bleeding, saturating and changing one pad every hour. . Inability to urinate 6 hours after discharge from hospital. . Pain not relieved by pain medication. . Fever of 100.4 F or greater. . Unusual vaginal discharge or odor.   No Ibuprofen products before 3 PM.

## 2020-12-02 NOTE — Transfer of Care (Signed)
Immediate Anesthesia Transfer of Care Note  Patient: Dawn Moon  Procedure(s) Performed: DILATATION & CURETTAGE/HYSTEROSCOPY WITH POLYPECTOMY AND REMOVAL OF VAGINAL LESION (N/A Vagina )  Patient Location: PACU  Anesthesia Type:General  Level of Consciousness: awake, alert , oriented and patient cooperative  Airway & Oxygen Therapy: Patient Spontanous Breathing  Post-op Assessment: Report given to RN and Post -op Vital signs reviewed and stable  Post vital signs: Reviewed and stable  Last Vitals:  Vitals Value Taken Time  BP 124/69 12/02/20 0930  Temp 36.4 C 12/02/20 0930  Pulse 90 12/02/20 0932  Resp 17 12/02/20 0932  SpO2 95 % 12/02/20 0932  Vitals shown include unvalidated device data.  Last Pain:  Vitals:   12/02/20 0653  TempSrc: Oral  PainSc: 0-No pain      Patients Stated Pain Goal: 6 (27/51/70 0174)  Complications: No complications documented.

## 2020-12-02 NOTE — Anesthesia Preprocedure Evaluation (Signed)
Anesthesia Evaluation  Patient identified by MRN, date of birth, ID band Patient awake    Reviewed: Allergy & Precautions, NPO status , Patient's Chart, lab work & pertinent test results  History of Anesthesia Complications (+) PONV  Airway Mallampati: II  TM Distance: >3 FB Neck ROM: Full    Dental no notable dental hx.    Pulmonary neg pulmonary ROS,    Pulmonary exam normal breath sounds clear to auscultation       Cardiovascular Exercise Tolerance: Good hypertension, Pt. on home beta blockers Normal cardiovascular exam+ dysrhythmias (h/o paroxysmal SVT on atenolol) Supra Ventricular Tachycardia  Rhythm:Regular Rate:Normal     Neuro/Psych negative neurological ROS  negative psych ROS   GI/Hepatic GERD  ,(+) Hepatitis -H/o NASH   Endo/Other  negative endocrine ROS  Renal/GU Renal disease (kidney stones)     Musculoskeletal  (+) Arthritis , Osteoarthritis,    Abdominal (+) + obese,   Peds  Hematology   Anesthesia Other Findings   Reproductive/Obstetrics Postmenopausal bleeding                             Lab Results  Component Value Date   CREATININE 1.23 (H) 11/18/2020   BUN 12 11/18/2020   NA 135 11/18/2020   K 4.4 11/18/2020   CL 103 11/18/2020   CO2 24 11/18/2020    Lab Results  Component Value Date   WBC 7.7 11/18/2020   HGB 14.3 11/18/2020   HCT 42.6 11/18/2020   MCV 92.1 11/18/2020   PLT 138.0 (L) 11/18/2020    Anesthesia Physical  Anesthesia Plan  ASA: III  Anesthesia Plan: General   Post-op Pain Management:    Induction: Intravenous  PONV Risk Score and Plan: 4 or greater and Treatment may vary due to age or medical condition, Scopolamine patch - Pre-op, Dexamethasone and Ondansetron  Airway Management Planned: LMA  Additional Equipment:   Intra-op Plan:   Post-operative Plan:   Informed Consent: I have reviewed the patients History and  Physical, chart, labs and discussed the procedure including the risks, benefits and alternatives for the proposed anesthesia with the patient or authorized representative who has indicated his/her understanding and acceptance.     Dental advisory given  Plan Discussed with: CRNA and Anesthesiologist  Anesthesia Plan Comments:         Anesthesia Quick Evaluation

## 2020-12-02 NOTE — H&P (Signed)
Dawn Moon Jul 30, 1956 MRN: [id number]  HPI The patient is a 64 y.o. [id number] who presents today for scheduled hysteroscopy dilation and curettage with possible Myosure polypectomy for evaluation of postmenopausal bleeding, endometrial thickening and insufficient office biopsy with suspected endometrial polyp.  No changes to her medical history since her pre op exam, denies CP, SOB, fever/chills, dysuria.  Past Medical History:  Diagnosis Date  . Ascites   . Dysrhythmia    hx of SVT  . Encephalopathy, hepatic ([place]) 05/13/2018  . GERD (gastroesophageal reflux disease)   . History of exercise stress test    03-07-2010  Stress echo--- no arrhythmias or conduction abnormalilites and negative for ischemia or chest pain  . History of kidney stones   . History of paroxysmal supraventricular tachycardia    episode 2011  consult w/ dr Caryl Comes --  put on atenolol--  per pt no longer an issue  . HTN (hypertension)    cardiologist --  dr Stanford Breed  . IBS (irritable bowel syndrome)    diarrhea  . Nonalcoholic steatohepatitis (NASH)   . Numbness and tingling of left lower extremity    post achilles tendon repair  . OA (osteoarthritis)    knees  . Pleural effusion on right    hepatic hydrothorax  . PMB (postmenopausal bleeding) none recent   thickened endometrium  . PONV (postoperative nausea and vomiting)   . Recurrent UTI none since 2019  . Secondary esophageal varices without bleeding ([place]) 06/03/2018  . Vitamin D deficiency   . Wears glasses    Past Surgical History:  Procedure Laterality Date  . ACHILLES TENDON SURGERY Left 06/2016  . BREAST BIOPSY Left 02/2016   benign  . COLONOSCOPY  2005  . CT CTA CORONARY W/CA SCORE W/CM &/OR WO/CM  01/01/2014   non-obstructive calcified plaque in pLAD (0-25%), no significant incidental noncardiac findings noted  . ESOPHAGOGASTRODUODENOSCOPY  01/2014  . EXTRACORPOREAL SHOCK WAVE LITHOTRIPSY  2010  . KNEE ARTHROPLASTY Right 03/24/2018    Procedure: RIGHT TOTAL KNEE ARTHROPLASTY WITH COMPUTER NAVIGATION;  Surgeon: Rod Can, MD;  Location: [facility];  Service: Orthopedics;  Laterality: Right;  Needs RNFA  . KNEE ARTHROSCOPY Right 12/04/2016   Procedure: ARTHROSCOPY KNEE WITH PARTIAL MEDIAL MENISCECTOMY;  Surgeon: Rod Can, MD;  Location: [facility];  Service: Orthopedics;  Laterality: Right;  . LEFT HEART CATHETERIZATION WITH CORONARY ANGIOGRAM N/A 01/11/2012   Procedure: LEFT HEART CATHETERIZATION WITH CORONARY ANGIOGRAM;  Surgeon: Sherren Mocha, MD;  Location: [facility] CATH LAB;  Service: Cardiovascular;  Laterality: N/A;  widely patent coronary arterires without significant obstructive CAD,  normal LVF, ef 55-56%  . TONSILLECTOMY AND ADENOIDECTOMY  child  . TRANSTHORACIC ECHOCARDIOGRAM  01/18/2012   mild LVH,  ef 60%/  trivial TR  . TUBAL LIGATION  1985  . UPPER GASTROINTESTINAL ENDOSCOPY  last done 2019   No Known Allergies No current facility-administered medications on file prior to encounter.   Current Outpatient Medications on File Prior to Encounter  Medication Sig Dispense Refill  . furosemide (LASIX) 40 MG tablet TAKE 1 TABLET DAILY (Patient taking differently: 40 mg.) 90 tablet 3  . nadolol (CORGARD) 20 MG tablet Take 0.5-1 tablets (10-20 mg total) by mouth daily as needed. (Patient taking differently: Take 20 mg by mouth daily.) 90 tablet 1  . omeprazole (PRILOSEC) 40 MG capsule Take 40 mg by mouth daily.     . potassium chloride SA (KLOR-CON) 20 MEQ tablet Take 1 tablet (20 mEq total) by mouth daily.  90 tablet 3  . rifaximin (XIFAXAN) 550 MG TABS tablet Take 1 tablet (550 mg total) by mouth 2 (two) times daily. 60 tablet 5  . spironolactone (ALDACTONE) 100 MG tablet TAKE 1 TABLET DAILY 90 tablet 3  . traMADol (ULTRAM) 50 MG tablet Take 50 mg by mouth 2 (two) times daily as needed.     . baclofen (LIORESAL) 10 MG tablet Take 10 mg by mouth 2 (two) times daily as needed.    .  diphenoxylate-atropine (LOMOTIL) 2.5-0.025 MG tablet 1-2 tabs every 6 hours as needed 60 tablet 2  . lactulose (CHRONULAC) 10 GM/15ML solution Take 10 g by mouth daily as needed.    . Multiple Vitamins-Minerals (MULTIVITAMIN WITH MINERALS) tablet Take 1 tablet by mouth daily.    . promethazine (PHENERGAN) 25 MG tablet Take 1 tablet (25 mg total) by mouth every 6 (six) hours as needed for nausea or vomiting. 30 tablet 1      Physical Exam   BP 128/75   Pulse 74   Temp (!) 97.1 F (36.2 C) (Oral)   Resp 16   Ht 5' 4"  (1.626 m)   Wt 83.6 kg   SpO2 100%   BMI 31.65 kg/m    General: Pleasant female, no acute distress, alert and oriented CV: RRR, no murmurs Pulm: good respiratory effort, CTAB     Plan Proceed with hysteroscopy dilation and curettage with possible Myosure polypectomy as planned.  Post operative recovery expectations reviewed.  She has received medical clearance for the procedure from her primary doctor with no interim changes to her medical condition.  All questions answered and the patient agrees to proceed.    [name], MD 12/02/20

## 2020-12-02 NOTE — Anesthesia Postprocedure Evaluation (Signed)
Anesthesia Post Note  Patient: [name]  Procedure(s) Performed: DILATATION & CURETTAGE/HYSTEROSCOPY WITH POLYPECTOMY AND REMOVAL OF VAGINAL LESION (N/A Vagina )     Patient location during evaluation: PACU Anesthesia Type: General Level of consciousness: awake and alert Pain management: pain level controlled Vital Signs Assessment: post-procedure vital signs reviewed and stable Respiratory status: spontaneous breathing, nonlabored ventilation, respiratory function stable and patient connected to nasal cannula oxygen Cardiovascular status: blood pressure returned to baseline and stable Postop Assessment: no apparent nausea or vomiting Anesthetic complications: no   No complications documented.  Last Vitals:  Vitals:   12/02/20 0945 12/02/20 1000  BP: 123/66 121/60  Pulse: 87 86  Resp: 14 14  Temp:    SpO2: 97% 98%    Last Pain:  Vitals:   12/02/20 1000  TempSrc:   PainSc: 0-No pain                 Merlinda Frederick

## 2020-12-03 ENCOUNTER — Encounter (HOSPITAL_BASED_OUTPATIENT_CLINIC_OR_DEPARTMENT_OTHER): Payer: Self-pay | Admitting: Obstetrics and Gynecology

## 2020-12-03 LAB — SURGICAL PATHOLOGY

## 2020-12-09 NOTE — Telephone Encounter (Signed)
Spoke with patient scheduled appointment for December 29 at 4:00.

## 2020-12-18 ENCOUNTER — Ambulatory Visit (INDEPENDENT_AMBULATORY_CARE_PROVIDER_SITE_OTHER): Payer: 59 | Admitting: Obstetrics and Gynecology

## 2020-12-18 ENCOUNTER — Encounter: Payer: Self-pay | Admitting: Obstetrics and Gynecology

## 2020-12-18 ENCOUNTER — Other Ambulatory Visit: Payer: Self-pay

## 2020-12-18 VITALS — BP 118/78

## 2020-12-18 DIAGNOSIS — Z9889 Other specified postprocedural states: Secondary | ICD-10-CM

## 2020-12-18 NOTE — Progress Notes (Signed)
   Dawn Moon 1956-07-31 [id number]  SUBJECTIVE   REASON FOR APPOINTMENT Chief Complaint  Patient presents with  . Routine Post Op    DILATATION & CURETTAGE/HYSTEROSCOPY WITH POLYPECTOMY AND REMOVAL OF VAGINAL LESION     HISTORY OF PRESENT ILLNESS Dawn Moon presents for routine 2 week post-operative follow up after a hysteroscopy, dilation and curettage, polypectomy and excision of vaginal lesion performed on 12/02/2020 for postmenopausal bleeding, endometrial thickening with insufficient office biopsy, and suspected endometrial polyp with intraoperative finding of a vaginal epithelial lesion just posterior to the cervix.  The patient is doing well with no concerns.  She denies vaginal bleeding or discharge. She is tolerating normal diet.  Bowel and bladder function are normal.  OBJECTIVE  BP 118/78 (BP Location: Right Arm, Patient Position: Sitting, Cuff Size: Normal)    PHYSICAL EXAM General:  Patient in no acute distress.   ASSESSMENT / PLAN  Routine 2 week post operative check.   The patient is doing well and meeting all postoperative milestones.  I reviewed the benign pathology report describing the endometrial tissue and polyp.  Vaginal lesion was consistent with a benign fibroepithelial polyp.  I showed her some pictures taken during the hysteroscopy.  She may return to normal activities without restriction and resume normal gynecologic care at this point.   [name] MD 12/18/20

## 2021-01-24 ENCOUNTER — Other Ambulatory Visit: Payer: Self-pay | Admitting: Internal Medicine

## 2021-01-24 DIAGNOSIS — K7581 Nonalcoholic steatohepatitis (NASH): Secondary | ICD-10-CM

## 2021-01-24 DIAGNOSIS — K7460 Unspecified cirrhosis of liver: Secondary | ICD-10-CM

## 2021-02-06 ENCOUNTER — Telehealth: Payer: Self-pay

## 2021-02-06 ENCOUNTER — Ambulatory Visit
Admission: RE | Admit: 2021-02-06 | Discharge: 2021-02-06 | Disposition: A | Discharge status: Home / Self Care | Payer: 59 | Source: Ambulatory Visit | Attending: Internal Medicine | Admitting: Internal Medicine

## 2021-02-06 DIAGNOSIS — K7581 Nonalcoholic steatohepatitis (NASH): Secondary | ICD-10-CM

## 2021-02-06 DIAGNOSIS — K7460 Unspecified cirrhosis of liver: Secondary | ICD-10-CM

## 2021-02-06 NOTE — Telephone Encounter (Signed)
US liver doppler results faxed to [facility] at fax # [phone] to Dr Enis Gash per their order.

## 2021-02-20 ENCOUNTER — Ambulatory Visit (INDEPENDENT_AMBULATORY_CARE_PROVIDER_SITE_OTHER): Payer: 59 | Admitting: Obstetrics and Gynecology

## 2021-02-20 ENCOUNTER — Other Ambulatory Visit: Payer: Self-pay

## 2021-02-20 ENCOUNTER — Encounter: Payer: Self-pay | Admitting: Obstetrics and Gynecology

## 2021-02-20 ENCOUNTER — Ambulatory Visit (INDEPENDENT_AMBULATORY_CARE_PROVIDER_SITE_OTHER): Payer: 59

## 2021-02-20 VITALS — BP 116/70 | HR 87

## 2021-02-20 DIAGNOSIS — N83201 Unspecified ovarian cyst, right side: Secondary | ICD-10-CM | POA: Diagnosis not present

## 2021-02-20 DIAGNOSIS — R9389 Abnormal findings on diagnostic imaging of other specified body structures: Secondary | ICD-10-CM | POA: Diagnosis not present

## 2021-02-20 NOTE — Progress Notes (Signed)
Dawn Moon 08/30/1956 [id number]  SUBJECTIVE:  65 y.o. G3P3003 female presents for 13-monthpelvic ultrasound follow-up of a right ovarian cyst. 02/2020 she had a CT abdomen and pelvis Dr. GCarlean Purlfollowing her for liver cirrhosis and abdominal pain which incidentally indicated a cystic lesion in the right adnexa with interval growth noted by repeat scan 08/2020. Ultrasound 10/03/2020 showed that it was a 6.6 cm right ovarian cyst with a significantly elevated CA-125 disease in the setting of nonalcoholic steatohepatitis and cirrhosis.  We did refer her for consultation with gynecologic oncology and Dr. RDenman Georgerecommended ultrasound surveillance with low suspicion for malignancy.  CEA and inhibin B were both normal.  Patient is not having any abdominal pain or bloating.   Current Outpatient Medications  Medication Sig Dispense Refill  . baclofen (LIORESAL) 10 MG tablet Take 10 mg by mouth 2 (two) times daily as needed.    . diphenoxylate-atropine (LOMOTIL) 2.5-0.025 MG tablet 1-2 tabs every 6 hours as needed 60 tablet 2  . furosemide (LASIX) 40 MG tablet TAKE 1 TABLET DAILY (Patient taking differently: 40 mg.) 90 tablet 3  . lactulose (CHRONULAC) 10 GM/15ML solution Take 10 g by mouth daily as needed.    . Multiple Vitamins-Minerals (MULTIVITAMIN WITH MINERALS) tablet Take 1 tablet by mouth daily.    . nadolol (CORGARD) 20 MG tablet Take 0.5-1 tablets (10-20 mg total) by mouth daily as needed. (Patient taking differently: Take 20 mg by mouth daily.) 90 tablet 1  . potassium chloride SA (KLOR-CON) 20 MEQ tablet Take 1 tablet (20 mEq total) by mouth daily. 90 tablet 3  . promethazine (PHENERGAN) 25 MG tablet Take 1 tablet (25 mg total) by mouth every 6 (six) hours as needed for nausea or vomiting. 30 tablet 1  . rifaximin (XIFAXAN) 550 MG TABS tablet Take 1 tablet (550 mg total) by mouth 2 (two) times daily. 60 tablet 5  . spironolactone (ALDACTONE) 100 MG tablet TAKE 1 TABLET DAILY 90  tablet 3  . traMADol (ULTRAM) 50 MG tablet Take 50 mg by mouth 2 (two) times daily as needed.      No current facility-administered medications for this visit.   Allergies: Patient has no known allergies.  No LMP recorded. Patient is postmenopausal.  Past medical history,surgical history, problem list, medications, allergies, family history and social history were all reviewed and documented as reviewed in the EPIC chart.   OBJECTIVE:  There were no vitals taken for this visit.  Pelvic ultrasound Anteverted uterus 8.7 x 5.0 x 4.4 cm Endometrium 5.8 mm No myometrial masses. Right ovary with 6.4 x 4.0 cm complex cyst, 6 x 4 mm solid component on outer wall with avascular appearance. Left ovary is normal. No adnexal masses. Moderate clear pelvic free fluid.   ASSESSMENT:  65y.o. [id number] for ultrasound surveillance of right ovarian cyst, also noted to have thickened endometrium again  PLAN:  1.  Right ovarian cyst with complex features.  Appears stable in size.  Patient is asymptomatic. We can move to annual ultrasound surveillance through 2026.  She was referred to Dr. RDenman Georgein gynecologic oncology 10/2020 for further recommendations regarding this cyst.  Given the patient's liver disease, CA-125 is not going to be a valid tumor marker moving forward for this patient.  She has ascites which is associated with the NASH and cirrhosis and she is followed by GI and hepatology.  Follow-up pelvic ultrasound in 1 year. 2.  Thickened endometrium.  She just had hysteroscopy D&C 11/2020  with benign tissue findings.  She has about 1 to 2 days of light spotting per month.  We discussed the thickened endometrium on today's ultrasound again and since she just had thorough tissue sampling I am okay with her just monitoring for now, but if she does continue to have the same bleeding pattern over the next few months then I want her to come back in to discuss management.  She would not be a good  candidate for progestin therapy given her ongoing liver disease.  May need to perform periodic endometrial sampling to monitor the endometrial thickening as she is at risk for development of endometrial cancer. The patient is aware that I will only be at this practice through the end of the week so she knows to make sure she requests follow-up with one of my physician partners.   [name] MD 02/20/21

## 2021-02-27 ENCOUNTER — Encounter: Payer: Self-pay | Admitting: Obstetrics and Gynecology

## 2021-02-27 NOTE — Telephone Encounter (Signed)
Dr.Sliva this patient saw Dr.Kendall on 02/20/21 and he recommend follow up ultrasound in 1 year.  I just wanted to confirm you agree with his plan, she is not scheduled for ultrasound yet. Please advise

## 2021-02-28 NOTE — Telephone Encounter (Signed)
This is Dr. Quincy Simmonds responding.   I have reviewed the patient's chart and I would like to make the following recommendations:  Please have her return to see me for an endometrial biopsy and blood work next week.   She is having ongoing postmenopausal bleeding and her endometrium is 5.8 mm.   The cyst on her ovary has now developed a solid component, so the cyst appears to be changing.  I did review images from the recent ultrasound and the prior one.  I would like to measure a CA125, knowing that her cirrhosis of the liver can also affect this.  If it has increased significantly, this would be helpful information.   After getting this information back, I will need to reach out to [facility] for further recommendations.

## 2021-02-28 NOTE — Telephone Encounter (Signed)
Left message for patient to call.

## 2021-03-03 ENCOUNTER — Other Ambulatory Visit: Payer: Self-pay | Admitting: *Deleted

## 2021-03-03 DIAGNOSIS — N83201 Unspecified ovarian cyst, right side: Secondary | ICD-10-CM

## 2021-03-03 NOTE — Telephone Encounter (Signed)
I informed patient with below and she is not having any bleeding or spotting. She will come tomorrow for CA-125.

## 2021-03-03 NOTE — Telephone Encounter (Signed)
Dr.Silva patient informed with your response. Patient asked if you could review her chart she saw Dr. Denman George in 10/2020 and she recommended D&C surgery which was done on 12/20/20. Patient reports she is not having bleeding and is unsure why she would need to return for biopsy.

## 2021-03-03 NOTE — Telephone Encounter (Signed)
Please clarify with the patient if she is having any vaginal bleeding or spotting at all.  Dr. Scarlette Ar last note from 02/20/21 states she is having spotting 1 - 2 days per month.  If she is not having bleeding, she does not need an endometrial biopsy.  If she is having any bleeding or spotting, I recommend the biopsy.   I do recommend the CA125 due to the solid nodule that has developed in the cyst.   After her information is complete, I will need to reach out to Dr. Denman George to let her know that the patient's ultrasound did change.   I have read her office visit with Dr. Denman George from 10/25/20.   I hope this helps to clarify.

## 2021-03-04 ENCOUNTER — Other Ambulatory Visit: Payer: Self-pay

## 2021-03-04 ENCOUNTER — Other Ambulatory Visit: Payer: 59

## 2021-03-04 DIAGNOSIS — N83201 Unspecified ovarian cyst, right side: Secondary | ICD-10-CM

## 2021-03-05 LAB — CA 125: CA 125: 211 U/mL — ABNORMAL HIGH (ref <35)

## 2021-03-07 ENCOUNTER — Other Ambulatory Visit: Payer: Self-pay | Admitting: Internal Medicine

## 2021-03-07 NOTE — Telephone Encounter (Signed)
How many refills Sir?

## 2021-03-14 ENCOUNTER — Other Ambulatory Visit: Payer: Self-pay | Admitting: Obstetrics and Gynecology

## 2021-03-14 DIAGNOSIS — N83201 Unspecified ovarian cyst, right side: Secondary | ICD-10-CM

## 2021-03-14 NOTE — Telephone Encounter (Signed)
Please contact patient in follow up to my receiving input from Dr. Denman George.   Dr. Denman George is not recommending any surgical care at this time.   Please schedule a pelvic ultrasound and follow up appointment with me in 6 months.   Have her call if she is experiencing abdominal/pelvic pain, vaginal bleeding, or bloating symptoms, so I could see her sooner to evaluate.

## 2021-03-14 NOTE — Telephone Encounter (Signed)
Patient informed with all the below.

## 2021-03-14 NOTE — Telephone Encounter (Signed)
-----   Message from Everitt Amber, MD sent at 03/11/2021  1:10 PM EDT ----- Regarding: RE: Please review Hi [name], I would not intervene on a 34m avascular mural nodule within a stable cyst. Thanks ETerrence Dupont  ----- Message ----- From: ANunzio Cobbs MD Sent: 03/06/2021   9:20 AM EDT To: BNunzio Cobbs MD, EEveritt Amber MD Subject: Please review                                  Hello Dr. RDenman George   I was asked to review this patient's chart to plan for her next follow up visit.  Dr. KDelilah Shanhas left our practice and is no longer in the the area.   This is a patient for which you have previously provided consultation for a right ovarian cyst and elevated CA125 in the presence of cirrhosis of the liver.   [name] had a follow up pelvic ultrasound in the office.  Her right ovarian cyst is currently measuring 6.4 x 4.0 cm, which is essentially stable in size.  What has changed is the development of a mural solid nodule measuring 6.4 x 4.0 cm.  Her endometrial stripe is 5.8 mm.   I asked my office to contact her by phone to confirm if she is bleeding or not.  She denied postmenopausal bleeding, so I did not perform an endometrial biopsy.  I asked her to return for a repeat CA125, knowing that the cirrhosis may affect this result.  Her current CA125 is 211, which is decreased from the previous level of 353.   Would you please provide a recommendation given the change in her ovarian cyst?  Thank you,   BJosefa Half

## 2021-03-17 ENCOUNTER — Ambulatory Visit
Admission: RE | Admit: 2021-03-17 | Discharge: 2021-03-17 | Disposition: A | Discharge status: Home / Self Care | Payer: 59 | Source: Ambulatory Visit | Attending: Family Medicine | Admitting: Family Medicine

## 2021-03-17 ENCOUNTER — Other Ambulatory Visit: Payer: Self-pay

## 2021-03-17 DIAGNOSIS — R921 Mammographic calcification found on diagnostic imaging of breast: Secondary | ICD-10-CM

## 2021-06-21 ENCOUNTER — Other Ambulatory Visit: Payer: Self-pay | Admitting: Cardiology

## 2021-07-30 ENCOUNTER — Telehealth: Payer: Self-pay

## 2021-07-30 ENCOUNTER — Other Ambulatory Visit: Payer: Self-pay

## 2021-07-30 DIAGNOSIS — R188 Other ascites: Secondary | ICD-10-CM

## 2021-07-30 NOTE — Telephone Encounter (Signed)
Scheduled US Paracentesis at [facility] on 08/05/21. Patient to arrive at 10:45 am. Patient aware of this.

## 2021-08-05 ENCOUNTER — Other Ambulatory Visit: Payer: Self-pay

## 2021-08-05 ENCOUNTER — Ambulatory Visit (HOSPITAL_COMMUNITY)
Admission: RE | Admit: 2021-08-05 | Discharge: 2021-08-05 | Disposition: A | Discharge status: Home / Self Care | Payer: 59 | Source: Ambulatory Visit | Attending: Internal Medicine | Admitting: Internal Medicine

## 2021-08-05 DIAGNOSIS — K7460 Unspecified cirrhosis of liver: Secondary | ICD-10-CM | POA: Insufficient documentation

## 2021-08-05 DIAGNOSIS — R188 Other ascites: Secondary | ICD-10-CM | POA: Diagnosis present

## 2021-08-05 DIAGNOSIS — K7581 Nonalcoholic steatohepatitis (NASH): Secondary | ICD-10-CM | POA: Insufficient documentation

## 2021-08-05 LAB — BODY FLUID CELL COUNT WITH DIFFERENTIAL
Eos, Fluid: 0 %
Lymphs, Fluid: 42 %
Monocyte-Macrophage-Serous Fluid: 52 % (ref 50–90)
Neutrophil Count, Fluid: 6 % (ref 0–25)
Total Nucleated Cell Count, Fluid: 435 cu mm (ref 0–1000)

## 2021-08-05 LAB — PROTEIN, PLEURAL OR PERITONEAL FLUID: Total protein, fluid: <3 g/dL

## 2021-08-05 LAB — ALBUMIN, PLEURAL OR PERITONEAL FLUID: Albumin, Fluid: <1 g/dL

## 2021-08-05 MED ORDER — LIDOCAINE HCL 1 % IJ SOLN
INTRAMUSCULAR | Status: AC
  Filled 2021-08-05: qty 20

## 2021-08-05 NOTE — Procedures (Signed)
Ultrasound-guided diagnostic and therapeutic paracentesis performed yielding 620 cc of hazy, yellow fluid. No immediate complications.  The fluid was submitted to the lab for preordered studies.EBL none.

## 2021-08-06 LAB — CYTOLOGY - NON PAP

## 2021-08-08 LAB — BODY FLUID CULTURE W GRAM STAIN: Culture: NO GROWTH

## 2021-08-20 ENCOUNTER — Encounter: Payer: Self-pay | Admitting: Internal Medicine

## 2021-09-04 ENCOUNTER — Ambulatory Visit (INDEPENDENT_AMBULATORY_CARE_PROVIDER_SITE_OTHER): Payer: Medicare Other | Admitting: Obstetrics and Gynecology

## 2021-09-04 ENCOUNTER — Other Ambulatory Visit: Payer: Self-pay

## 2021-09-04 ENCOUNTER — Ambulatory Visit (INDEPENDENT_AMBULATORY_CARE_PROVIDER_SITE_OTHER): Payer: Medicare Other

## 2021-09-04 ENCOUNTER — Encounter: Payer: Self-pay | Admitting: Obstetrics and Gynecology

## 2021-09-04 ENCOUNTER — Other Ambulatory Visit: Payer: Self-pay | Admitting: Obstetrics and Gynecology

## 2021-09-04 VITALS — BP 114/76 | HR 74 | Resp 12 | Ht 63.0 in | Wt 167.0 lb

## 2021-09-04 DIAGNOSIS — N83201 Unspecified ovarian cyst, right side: Secondary | ICD-10-CM

## 2021-09-04 NOTE — Progress Notes (Signed)
GYNECOLOGY  VISIT   HPI: 65 y.o.   Married  Caucasian  female   [phone] with No LMP recorded. Patient is postmenopausal.   here for pelvic ultrasound.   Patient is followed conservatively for a right ovarian cyst and elevated CA125 attributed to he liver cirrhosis.  She has know ascites.  Patient had paracentesis on 08/05/21.  She is going to start her evaluation to be a liver transplant candidate.  The ovarian cyst was first noted with a CT scans and has been followed with pelvic ultrasounds.  Her original pelvic ultrasound done 10/03/20 showed the right ovarian cyst measured 6.6 x 4.5 x 5.2 cm.   Her pelvic US 02/20/21 showed the right ovarian cyst 6.4 x 4.0 cm with a 6 x 4 mm solid avascular component to the wall.  Her endometrium measured 5.8 mm.   CA125 measured 211 on 03/04/21.  This is decreased from 10/14/20 when it measured 353.  No RLQ pain.  No vaginal bleeding.   Dr. Everitt Amber of [facility] has consulted for the patient on 10/25/20 and recommended yearly ultrasounds and conservative management unless the cyst increases in size by 50% or develops solid components.   She recommended against surgical care for the recent development of a solid avascular nodule of the cyst wall noted on the pelvic US 02/20/21.  GYNECOLOGIC HISTORY: No LMP recorded. Patient is postmenopausal. Contraception:  post menopausal  Menopausal hormone therapy:  none Last mammogram:  03-17-21 density C/BIRADS 3 probably benign  Last pap smear:   08-25-18 negative, HR HPV negative         OB History     Gravida  3   Para  3   Term  3   Preterm      AB      Living  3      SAB      IAB      Ectopic      Multiple      Live Births                 Patient Active Problem List   Diagnosis Date Noted   Adnexal mass 10/25/2020   Elevated cancer antigen 125 (CA 125) 10/25/2020   Thickened endometrium 10/25/2020   Portal hypertensive gastropathy ([place]) 02/29/2020   Recurrent UTI 06/12/2019    Secondary insomnia 04/10/2019   Anemia 02/28/2019   Thrombocytopenia ([place]) 02/28/2019   Degeneration of lumbar intervertebral disc 08/06/2018   Secondary esophageal varices without bleeding ([place]) 06/03/2018   Encephalopathy, hepatic ([place]) 05/13/2018   History of paroxysmal supraventricular tachycardia    Liver cirrhosis secondary to NASH ([place]) 04/23/2018   Irritable bowel syndrome-diarrhea predominant 06/15/2017   Gastroesophageal reflux disease 06/15/2017   Hyperlipidemia    Kidney stone    Essential hypertension 02/13/2010    Past Medical History:  Diagnosis Date   Ascites    Dysrhythmia    hx of SVT   Encephalopathy, hepatic ([place]) 05/13/2018   GERD (gastroesophageal reflux disease)    History of exercise stress test    03-07-2010  Stress echo--- no arrhythmias or conduction abnormalilites and negative for ischemia or chest pain   History of kidney stones    History of paroxysmal supraventricular tachycardia    episode 2011  consult w/ dr Caryl Comes --  put on atenolol--  per pt no longer an issue   HTN (hypertension)    cardiologist --  dr Stanford Breed   IBS (irritable bowel syndrome)  diarrhea   Nonalcoholic steatohepatitis (NASH)    Numbness and tingling of left lower extremity    post achilles tendon repair   OA (osteoarthritis)    knees   Pleural effusion on right    hepatic hydrothorax   PMB (postmenopausal bleeding) none recent   thickened endometrium   PONV (postoperative nausea and vomiting)    Recurrent UTI none since 2019   Secondary esophageal varices without bleeding ([place]) 06/03/2018   Vitamin D deficiency    Wears glasses     Past Surgical History:  Procedure Laterality Date   ACHILLES TENDON SURGERY Left 06/2016   BREAST BIOPSY Left 02/2016   benign   COLONOSCOPY  2005   CT CTA CORONARY W/CA SCORE W/CM &/OR WO/CM  01/01/2014   non-obstructive calcified plaque in pLAD (0-25%), no significant incidental noncardiac findings noted   DILATATION &  CURETTAGE/HYSTEROSCOPY WITH MYOSURE N/A 12/02/2020   Procedure: DILATATION & CURETTAGE/HYSTEROSCOPY WITH POLYPECTOMY AND REMOVAL OF VAGINAL LESION;  Surgeon: [name], MD;  Location: [facility];  Service: Gynecology;  Laterality: N/A;  request to follow in [place] held (DR. Delilah Shan has two other cases that morning starting 7:30am)   ESOPHAGOGASTRODUODENOSCOPY  01/2014   EXTRACORPOREAL SHOCK WAVE LITHOTRIPSY  2010   KNEE ARTHROPLASTY Right 03/24/2018   Procedure: RIGHT TOTAL KNEE ARTHROPLASTY WITH COMPUTER NAVIGATION;  Surgeon: Rod Can, MD;  [facility];  Service: Orthopedics;  Laterality: Right;  Needs RNFA   KNEE ARTHROSCOPY Right 12/04/2016   Procedure: ARTHROSCOPY KNEE WITH PARTIAL MEDIAL MENISCECTOMY;  Surgeon: Rod Can, MD;  Location: [facility];  Service: Orthopedics;  Laterality: Right;   LEFT HEART CATHETERIZATION WITH CORONARY ANGIOGRAM N/A 01/11/2012   Procedure: LEFT HEART CATHETERIZATION WITH CORONARY ANGIOGRAM;  Surgeon: Sherren Mocha, MD;  Location: [facility];  Service: Cardiovascular;  Laterality: N/A;  widely patent coronary arterires without significant obstructive CAD,  normal LVF, ef 55-56%   TONSILLECTOMY AND ADENOIDECTOMY  child   TRANSTHORACIC ECHOCARDIOGRAM  01/18/2012   mild LVH,  ef 60%/  trivial TR   TUBAL LIGATION  1985   UPPER GASTROINTESTINAL ENDOSCOPY  last done 2019    Current Outpatient Medications  Medication Sig Dispense Refill   diphenoxylate-atropine (LOMOTIL) 2.5-0.025 MG tablet TAKE  (1) OR (2)  TABLETS EVERY SIX HOURS AS NEEDED 60 tablet 2   furosemide (LASIX) 40 MG tablet TAKE 1 TABLET DAILY (Patient taking differently: 40 mg.) 90 tablet 3   lactulose (CHRONULAC) 10 GM/15ML solution Take 10 g by mouth daily as needed.     nadolol (CORGARD) 20 MG tablet Take 0.5-1 tablets (10-20 mg total) by mouth daily as needed. (Patient taking differently: Take 20 mg by mouth daily.) 90 tablet 1    Omeprazole Magnesium 10 MG PACK      potassium chloride SA (KLOR-CON) 20 MEQ tablet TAKE 1 TABLET ONCE DAILY 90 tablet 3   promethazine (PHENERGAN) 25 MG tablet Take 1 tablet (25 mg total) by mouth every 6 (six) hours as needed for nausea or vomiting. 30 tablet 1   rifaximin (XIFAXAN) 550 MG TABS tablet Take 1 tablet (550 mg total) by mouth 2 (two) times daily. 60 tablet 5   spironolactone (ALDACTONE) 100 MG tablet TAKE 1 TABLET DAILY 90 tablet 3   No current facility-administered medications for this visit.     ALLERGIES: Patient has no known allergies.  Family History  Problem Relation Age of Onset   Lymphoma Mother  non-hodgkins - died @ 58   Coronary artery disease Mother    Coronary artery disease Father        CABG in his 66s, died @ 64   Diabetes Father    Healthy Brother    Hypertension Sister    Arthritis Sister    IgA nephropathy Daughter    Kidney failure Daughter    Celiac disease Daughter    Breast cancer Maternal Aunt    Colon cancer Neg Hx    Esophageal cancer Neg Hx    Stomach cancer Neg Hx    Rectal cancer Neg Hx    Ovarian cancer Neg Hx    Cervical cancer Neg Hx     Social History   Socioeconomic History   Marital status: Married    Spouse name: Not on file   Number of children: Not on file   Years of education: Not on file   Highest education level: Not on file  Occupational History   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  Has to walk up stairs to work and can do w/o Ss.  Lives @ home in [place] with family.   Social Determinants of Health   Financial Resource Strain: Not on file  Food Insecurity: Not on file  Transportation Needs: Not on file  Physical Activity: Not on file  Stress: Not on file  Social Connections: Not on file  Intimate Partner Violence: Not on file    Review of Systems  All other systems reviewed and are negative.  PHYSICAL EXAMINATION:    BP 114/76 (BP Location: Right Arm, Patient Position: Sitting, Cuff Size: Normal)   Pulse 74   Resp 12   Ht 5' 3"  (1.6 m)   Wt 167 lb (75.8 kg)   BMI 29.58 kg/m     General appearance: alert, cooperative and appears stated age  Pelvic US Uterus 8.97 x 5.62 x 4.71 cm.  Fibroid 0.99 cm.  EMS 3.42 mm.  Right ovary small with adjacent cyst noted 6.8 x 4.5 cm, slightly larger since last [place] 02/20/21. No nodules noted.  Left ovary atrophic.  No adnexal masses.  7.5 cm pocket of free fluid noted.   ASSESSMENT  Right ovarian cyst, stable.   PLAN  Pelvic US images and reports reviewed from today as well as multiple other [place] and CT reports in Epic. Follow up for yearly office visit and pelvic US. She will call if she develops any vaginal bleeding or right lower quadrant pain.  We discussed ovarian torsion symptoms as well.    An After Visit Summary was printed and given to the patient.  30 min total time was spent for this patient encounter, including preparation, face-to-face counseling with the patient, coordination of care, and documentation of the encounter.

## 2021-09-15 NOTE — Progress Notes (Addendum)
Virtual Visit via Video Note   This visit type was conducted due to national recommendations for restrictions regarding the COVID-19 Pandemic (e.g. social distancing) in an effort to limit this patient's exposure and mitigate transmission in our community.  Due to her co-morbid illnesses, this patient is at least at moderate risk for complications without adequate follow up.  This format is felt to be most appropriate for this patient at this time.  All issues noted in this document were discussed and addressed.  A limited physical exam was performed with this format.  Please refer to the patient's chart for her consent to telehealth for [facility].      Date:  09/18/2021   ID:  Dawn Moon, DOB [birth year], MRN [id number]  Patient Location:Home Provider Location: Home  PCP:  [name], NP  Cardiologist:  Dr Stanford Breed  Evaluation Performed:  Follow-Up Visit  Chief Complaint:  FU hypertension and volume excess  History of Present Illness:    FU hypertension and volume excess. CardioNet 2011 showed 4 beats of nonsustained ventricular tachycardia; also brief atrial tachycardia; note some of symptoms also noted to be sinus tachycardia. Patient was seen by Dr. Caryl Comes and started on verapamil. Her symptoms did not improve and she was changed to a beta blocker. Her symptoms improved with atenolol. Cardiac catheterization 1/13 revealed no obstructive coronary artery disease. Her ejection fraction was 55-65%. Cardiac CT January 2015 showed nonobstructive plaque in the proximal LAD.  Patient admitted April 2019 and underwent right knee replacement.  Following discharge she developed worsening dyspnea.  Echocardiogram May 2019 showed normal LV systolic function and mild left atrial enlargement. CTA May 2019 showed no pulmonary embolus.  There was a large right pleural effusion and lobulated liver compatible with cirrhosis.  There was ascites.  Patient underwent thoracentesis and  paracentesis.  She was also diuresed. She was diagnosed with cirrhosis due to NASH. She was discharged on higher dose diuretics.  Patient then readmitted with confusion and diagnosed with hepatic encephalopathy and acute renal insufficiency.  Patient was treated with lactulose and xifaxan.  Diuretics were reduced.  She has been followed by gastroenterology as an outpatient.  She was referred to [facility] for consideration of transplant. EGD March 2021 showed esophageal varices.  Last echocardiogram September 2021 showed normal LV function, grade 1 diastolic dysfunction, small pericardial fusion.  CTA September 2021 showed 5 mm left upper lobe pulmonary nodule and follow-up recommended 12 months if pt high risk, cirrhotic liver with portal venous hypertension including perigastric and paraesophageal varices and cystic lesion in the right adnexal space.  Pelvic ultrasound October 2021 showed cyst in the right ovary concerning for cystic neoplasm.  There was also thickening of the endometrium and biopsy recommended to exclude malignancy.  Since last seen, she has dyspnea on exertion but denies orthopnea, PND, pedal edema, chest pain or syncope.  She is presently being evaluated for lung transplant.    The patient does not have symptoms concerning for COVID-19 infection (fever, chills, cough, or new shortness of breath).    Past Medical History:  Diagnosis Date   Ascites    Dysrhythmia    hx of SVT   Encephalopathy, hepatic ([place]) 05/13/2018   GERD (gastroesophageal reflux disease)    History of exercise stress test    03-07-2010  Stress echo--- no arrhythmias or conduction abnormalilites and negative for ischemia or chest pain   History of kidney stones    History of paroxysmal supraventricular tachycardia  episode 2011  consult w/ dr Caryl Comes --  put on atenolol--  per pt no longer an issue   HTN (hypertension)    cardiologist --  dr Stanford Breed   IBS (irritable bowel syndrome)    diarrhea   Nonalcoholic  steatohepatitis (NASH)    Numbness and tingling of left lower extremity    post achilles tendon repair   OA (osteoarthritis)    knees   Pleural effusion on right    hepatic hydrothorax   PMB (postmenopausal bleeding) none recent   thickened endometrium   PONV (postoperative nausea and vomiting)    Recurrent UTI none since 2019   Secondary esophageal varices without bleeding ([place]) 06/03/2018   Vitamin D deficiency    Wears glasses    Past Surgical History:  Procedure Laterality Date   ACHILLES TENDON SURGERY Left 06/2016   BREAST BIOPSY Left 02/2016   benign   COLONOSCOPY  2005   CT CTA CORONARY W/CA SCORE W/CM &/OR WO/CM  01/01/2014   non-obstructive calcified plaque in pLAD (0-25%), no significant incidental noncardiac findings noted   DILATATION & CURETTAGE/HYSTEROSCOPY WITH MYOSURE N/A 12/02/2020   Procedure: DILATATION & CURETTAGE/HYSTEROSCOPY WITH POLYPECTOMY AND REMOVAL OF VAGINAL LESION;  Surgeon: [name], MD;  Location: [facility];  Service: Gynecology;  Laterality: N/A;  request to follow in [place] held (DR. Delilah Shan has two other cases that morning starting 7:30am)   ESOPHAGOGASTRODUODENOSCOPY  01/2014   EXTRACORPOREAL SHOCK WAVE LITHOTRIPSY  2010   KNEE ARTHROPLASTY Right 03/24/2018   Procedure: RIGHT TOTAL KNEE ARTHROPLASTY WITH COMPUTER NAVIGATION;  Surgeon: Rod Can, MD;  [facility];  Service: Orthopedics;  Laterality: Right;  Needs RNFA   KNEE ARTHROSCOPY Right 12/04/2016   Procedure: ARTHROSCOPY KNEE WITH PARTIAL MEDIAL MENISCECTOMY;  Surgeon: Rod Can, MD;  Location: [facility];  Service: Orthopedics;  Laterality: Right;   LEFT HEART CATHETERIZATION WITH CORONARY ANGIOGRAM N/A 01/11/2012   Procedure: LEFT HEART CATHETERIZATION WITH CORONARY ANGIOGRAM;  Surgeon: Sherren Mocha, MD;  Location: [facility] CATH LAB;  Service: Cardiovascular;  Laterality: N/A;  widely patent coronary arterires without  significant obstructive CAD,  normal LVF, ef 55-56%   TONSILLECTOMY AND ADENOIDECTOMY  child   TRANSTHORACIC ECHOCARDIOGRAM  01/18/2012   mild LVH,  ef 60%/  trivial TR   TUBAL LIGATION  1985   UPPER GASTROINTESTINAL ENDOSCOPY  last done 2019     Current Meds  Medication Sig   diphenoxylate-atropine (LOMOTIL) 2.5-0.025 MG tablet TAKE  (1) OR (2)  TABLETS EVERY SIX HOURS AS NEEDED   furosemide (LASIX) 40 MG tablet TAKE 1 TABLET DAILY (Patient taking differently: 40 mg.)   lactulose (CHRONULAC) 10 GM/15ML solution Take 10 g by mouth daily as needed.   nadolol (CORGARD) 20 MG tablet Take 0.5-1 tablets (10-20 mg total) by mouth daily as needed. (Patient taking differently: Take 20 mg by mouth daily.)   Omeprazole Magnesium 10 MG PACK    potassium chloride SA (KLOR-CON) 20 MEQ tablet TAKE 1 TABLET ONCE DAILY   promethazine (PHENERGAN) 25 MG tablet Take 1 tablet (25 mg total) by mouth every 6 (six) hours as needed for nausea or vomiting.   rifaximin (XIFAXAN) 550 MG TABS tablet Take 1 tablet (550 mg total) by mouth 2 (two) times daily.   spironolactone (ALDACTONE) 100 MG tablet TAKE 1 TABLET DAILY     Allergies:   Patient has no known allergies.   Social History   Tobacco Use   Smoking status: Never  Smokeless tobacco: Never  Vaping Use   Vaping Use: Never used  Substance Use Topics   Alcohol use: Not Currently   Drug use: No     Family Hx: The patient's family history includes Arthritis in her sister; Breast cancer in her maternal aunt; Celiac disease in her daughter; Coronary artery disease in her father and mother; Diabetes in her father; Healthy in her brother; Hypertension in her sister; IgA nephropathy in her daughter; Kidney failure in her daughter; Lymphoma in her mother. There is no history of Colon cancer, Esophageal cancer, Stomach cancer, Rectal cancer, Ovarian cancer, or Cervical cancer.  ROS:   Please see the history of present illness.    No Fever, chills  or  productive cough All other systems reviewed and are negative.   Recent Labs: 11/18/2020: ALT 25; BUN 12; Creatinine, Ser 1.23; Potassium 4.4; Sodium 135 12/02/2020: Hemoglobin 13.4; Platelets 148   Recent Lipid Panel Lab Results  Component Value Date/Time   CHOL 252 (H) 09/05/2019 09:25 AM   CHOL 200 (H) 06/15/2017 09:55 AM   TRIG 223.0 (H) 09/05/2019 09:25 AM   HDL 54.70 09/05/2019 09:25 AM   HDL 50 06/15/2017 09:55 AM   CHOLHDL 5 09/05/2019 09:25 AM   LDLCALC 122 (H) 08/25/2018 08:50 AM   LDLCALC 112 (H) 06/15/2017 09:55 AM   LDLDIRECT 66.0 09/05/2019 09:25 AM    Wt Readings from Last 3 Encounters:  09/18/21 163 lb (73.9 kg)  09/04/21 167 lb (75.8 kg)  12/02/20 184 lb 6.4 oz (83.6 kg)     Objective:    Vital Signs:  BP 105/72   Ht 5' 4"  (1.626 m)   Wt 163 lb (73.9 kg)   BMI 27.98 kg/m    VITAL SIGNS:  reviewed NAD Answers questions appropriately Normal affect Remainder of physical examination not performed (telehealth visit; coronavirus pandemic)  ASSESSMENT & PLAN:    Volume excess-this is felt secondary to cirrhosis.  We will continue diuretics at present dose.  Reasonably well-controlled. Hypertension-blood pressure controlled.  Continue present medications and follow. Palpitations/nonsustained ventricular tachycardia-continue beta-blocker at present dose. Cirrhosis-followed by gastroenterology. Pulmonary nodule-patient states that she will discuss this with her physicians at [facility].  She is presently being evaluated for possible liver transplant and they are performing multiple scans. Ovarian lesion-follow-up with oncology.  COVID-19 Education: The importance of social distancing was discussed today.  Time:   Today, I have spent 16 minutes with the patient with telehealth technology discussing the above problems.     Medication Adjustments/Labs and Tests Ordered: Current medicines are reviewed at length with the patient today.  Concerns  regarding medicines are outlined above.   Tests Ordered: No orders of the defined types were placed in this encounter.   Medication Changes: No orders of the defined types were placed in this encounter.   Follow Up:  In Person in 1 year(s)  Signed, Kirk Ruths, MD  09/18/2021 8:37 AM    [place]

## 2021-09-18 ENCOUNTER — Encounter: Payer: Self-pay | Admitting: Cardiology

## 2021-09-18 ENCOUNTER — Telehealth (INDEPENDENT_AMBULATORY_CARE_PROVIDER_SITE_OTHER): Payer: 59 | Admitting: Cardiology

## 2021-09-18 VITALS — BP 105/72 | Ht 64.0 in | Wt 163.0 lb

## 2021-09-18 DIAGNOSIS — R002 Palpitations: Secondary | ICD-10-CM

## 2021-09-18 DIAGNOSIS — E8770 Fluid overload, unspecified: Secondary | ICD-10-CM

## 2021-09-18 DIAGNOSIS — I10 Essential (primary) hypertension: Secondary | ICD-10-CM

## 2021-09-18 NOTE — Patient Instructions (Signed)

## 2021-09-23 ENCOUNTER — Ambulatory Visit: Payer: 59 | Admitting: Nurse Practitioner

## 2021-10-01 ENCOUNTER — Telehealth: Payer: Self-pay

## 2021-10-01 NOTE — Telephone Encounter (Signed)
This pt was last seen in the office 11/18/2020.   Last EGD was done 02/29/2020;     Pt last Colonoscopy was done with [facility] on 01/24/2014. Records in Epic: Path report under labs and  procedure report under  Media.   Pt cancelled most recent office Visit. Would you like for me to schedule this pt with an APP to assess. Please see note below and advise.                Lelon Huh  P [facility] Clinical Pool This message is being sent by Patsey Berthold on behalf of Cheris Tweten.   Hi all,   Mom is having her liver transplant evaluation at [facility] this week. They have requested an updated colonoscopy and EGD. Can you help [place] get that scheduled?   Thank you!  [facility]

## 2021-10-01 NOTE — Telephone Encounter (Signed)
I would like to see her in the office to arrange this.  I use 1130 and 350 as workin spots.  We can look for 1 of those but not on October 17 and I will see her

## 2021-10-02 NOTE — Telephone Encounter (Signed)
Pt scheduled for 10/08/2021 at 11:30 for OV with Dr. Carlean Purl. Pt made aware. Pt verbalized understanding with all questions answered.

## 2021-10-02 NOTE — Telephone Encounter (Signed)
Left Message for pt to call Back

## 2021-10-04 ENCOUNTER — Telehealth: Payer: Self-pay | Admitting: Physician Assistant

## 2021-10-04 NOTE — Telephone Encounter (Signed)
Telephone Call to on-call service this weekend  10/04/2021 1:35 PM  Called and spoke with daughter who was concerned that her mother seemed more confused over the past couple of days.  Tells me that they started her Lactulose back yesterday and gave her 1 dose and she has had 1 bowel movement, they gave her another dose this morning and they do feel like overnight she has gotten slightly better.  Asking how much Lactulose she can give.  Discussed that she can take lactulose 3-4 times a day, would recommend they increase to 3 times a day and see how she does over the next 24 hours.  If her confusion continues to clear then they can continue this dose until they feel it is better.  They do have follow-up with Dr. Carlean Purl next week already.  Told patient's daughter to call back if she does not feel like things are improving over the next 24 hours.  Ellouise Newer, PA-C

## 2021-10-08 ENCOUNTER — Ambulatory Visit (INDEPENDENT_AMBULATORY_CARE_PROVIDER_SITE_OTHER): Payer: Medicare Other | Admitting: Internal Medicine

## 2021-10-08 ENCOUNTER — Encounter: Payer: Self-pay | Admitting: Internal Medicine

## 2021-10-08 VITALS — BP 110/74 | HR 82 | Ht 64.0 in | Wt 167.0 lb

## 2021-10-08 DIAGNOSIS — K7682 Hepatic encephalopathy: Secondary | ICD-10-CM | POA: Diagnosis not present

## 2021-10-08 DIAGNOSIS — I8510 Secondary esophageal varices without bleeding: Secondary | ICD-10-CM

## 2021-10-08 DIAGNOSIS — K7581 Nonalcoholic steatohepatitis (NASH): Secondary | ICD-10-CM

## 2021-10-08 DIAGNOSIS — Z1211 Encounter for screening for malignant neoplasm of colon: Secondary | ICD-10-CM

## 2021-10-08 DIAGNOSIS — K7469 Other cirrhosis of liver: Secondary | ICD-10-CM

## 2021-10-08 DIAGNOSIS — K7460 Unspecified cirrhosis of liver: Secondary | ICD-10-CM

## 2021-10-08 DIAGNOSIS — Z01818 Encounter for other preprocedural examination: Secondary | ICD-10-CM

## 2021-10-08 MED ORDER — PLENVU 140 G PO SOLR: 1.0000 | ORAL | Status: DC

## 2021-10-08 NOTE — Progress Notes (Signed)
Dawn Moon 65 y.o. October 31, 1956 [id number]  Assessment & Plan:   Encounter Diagnoses  Name Primary?   Secondary esophageal varices without bleeding (HCC) Yes   Liver cirrhosis secondary to NASH [facility])    Colon cancer screening    Pre-transplant evaluation for chronic liver disease    Encephalopathy, hepatic     Schedule EGD and colonoscopy at the hospital.  I think that the safest place for her and we will do it there in early December.  Continue current care otherwise.  I will see if it is possible to obtain Xifaxan for her though the programs to help afford it when its not covered on insurance typically are only available to people with commercial insurance.  Other care to be coordinated through [place] as it is I am available as needed for help as her local gastroenterologist.  She asked about FMLA and I suggested that it would be best if the [facility] people fill that out but if that was not going to work out I would do my best to fill those out for her.  CC: [name], NP Dr. Enis Gash [facility]  Subjective:   Chief Complaint: Schedule EGD and colonoscopy for liver transplant work-up  HPI 65 year old white woman here with her daughter, the patient has a history of liver cirrhosis secondary to [name] and a history of esophageal varices last evaluated in March 2021 (she is on nadolol 20 mg daily at this time) who I am seeing because she is in the process of being listed for liver plus or minus kidney transplant and the transplant team is asking for her to have an updated screening colonoscopy and EGD.  Unfortunately she cannot afford her Xifaxan at this time she is using lactulose to treat her encephalopathy in the past that was problematic due to diarrhea she has IBS-D.  She is managing with that.  She did have a recent episode of encephalopathy probably related to nonadherence with medication because of the problems listed above i.e. change in insurance and  Xifaxan costing $3000 a month.  She is weaker and somewhat deteriorated than when I had seen her in the past.  She says she really does not think she can continue to keep up with work at this time.  She reports that they feel liberalized her sodium intake.  Appetite is off but there is no major early satiety.  She is supplementing with protein drinks.  Labs on 09/30/2021 with platelets 124 and a CBC otherwise normal CMP with sodium 133 and creatinine 1.4 AST 52 ALT 27 bili 3 albumin 3.1 otherwise normal A1c is 5.1% INR is 1.2   She is hoping for a living related liver transplant from her daughter, that particular daughter has already donated a kidney to the daughter that is here with her today.  I have reviewed visits with Dr. Lissa Merlin Dr. Theda Sers of [facility] transplant (renal and liver respectively).  I reviewed studies at [facility] as well that have taken place in the last several months. No Known Allergies Current Meds  Medication Sig   diphenoxylate-atropine (LOMOTIL) 2.5-0.025 MG tablet TAKE  (1) OR (2)  TABLETS EVERY SIX HOURS AS NEEDED   furosemide (LASIX) 40 MG tablet TAKE 1 TABLET DAILY (Patient taking differently: 40 mg.)   lactulose (CHRONULAC) 10 GM/15ML solution Take 10 g by mouth daily as needed.   nadolol (CORGARD) 20 MG tablet Take 0.5-1 tablets (10-20 mg total) by mouth daily as needed. (Patient taking differently:  Take 20 mg by mouth.)   Omeprazole Magnesium 10 MG PACK    PEG-KCl-NaCl-NaSulf-Na Asc-C (PLENVU) 140 g SOLR Take 1 kit by mouth as directed.   potassium chloride SA (KLOR-CON) 20 MEQ tablet TAKE 1 TABLET ONCE DAILY   promethazine (PHENERGAN) 25 MG tablet Take 1 tablet (25 mg total) by mouth every 6 (six) hours as needed for nausea or vomiting.   spironolactone (ALDACTONE) 100 MG tablet TAKE 1 TABLET DAILY   Past Medical History:  Diagnosis Date   Ascites    Dysrhythmia    hx of SVT   Encephalopathy, hepatic 05/13/2018   GERD (gastroesophageal reflux disease)    History  of exercise stress test    03-07-2010  Stress echo--- no arrhythmias or conduction abnormalilites and negative for ischemia or chest pain   History of kidney stones    History of paroxysmal supraventricular tachycardia    episode 2011  consult w/ dr Caryl Comes --  put on atenolol--  per pt no longer an issue   HTN (hypertension)    cardiologist --  dr Stanford Breed   IBS (irritable bowel syndrome)    diarrhea   Nonalcoholic steatohepatitis (NASH)    Numbness and tingling of left lower extremity    post achilles tendon repair   OA (osteoarthritis)    knees   Pleural effusion on right    hepatic hydrothorax   PMB (postmenopausal bleeding) none recent   thickened endometrium   PONV (postoperative nausea and vomiting)    Recurrent UTI none since 2019   Secondary esophageal varices without bleeding ([place]) 06/03/2018   Vitamin D deficiency    Wears glasses    Past Surgical History:  Procedure Laterality Date   ACHILLES TENDON SURGERY Left 06/2016   BREAST BIOPSY Left 02/2016   benign   COLONOSCOPY  2005   CT CTA CORONARY W/CA SCORE W/CM &/OR WO/CM  01/01/2014   non-obstructive calcified plaque in pLAD (0-25%), no significant incidental noncardiac findings noted   DILATATION & CURETTAGE/HYSTEROSCOPY WITH MYOSURE N/A 12/02/2020   Procedure: DILATATION & CURETTAGE/HYSTEROSCOPY WITH POLYPECTOMY AND REMOVAL OF VAGINAL LESION;  Surgeon: [name], MD;  Location: [place];  Service: Gynecology;  Laterality: N/A;  request to follow in [place] held (DR. Delilah Shan has two other cases that morning starting 7:30am)   ESOPHAGOGASTRODUODENOSCOPY  01/2014   EXTRACORPOREAL SHOCK WAVE LITHOTRIPSY  2010   KNEE ARTHROPLASTY Right 03/24/2018   Procedure: RIGHT TOTAL KNEE ARTHROPLASTY WITH COMPUTER NAVIGATION;  Surgeon: Rod Can, MD;  Location: [facility];  Service: Orthopedics;  Laterality: Right;  Needs RNFA   KNEE ARTHROSCOPY Right 12/04/2016   Procedure: ARTHROSCOPY KNEE  WITH PARTIAL MEDIAL MENISCECTOMY;  Surgeon: Rod Can, MD;  Location: [place];  Service: Orthopedics;  Laterality: Right;   LEFT HEART CATHETERIZATION WITH CORONARY ANGIOGRAM N/A 01/11/2012   Procedure: LEFT HEART CATHETERIZATION WITH CORONARY ANGIOGRAM;  Surgeon: Sherren Mocha, MD;  Location: [facility] CATH LAB;  Service: Cardiovascular;  Laterality: N/A;  widely patent coronary arterires without significant obstructive CAD,  normal LVF, ef 55-56%   TONSILLECTOMY AND ADENOIDECTOMY  child   TRANSTHORACIC ECHOCARDIOGRAM  01/18/2012   mild LVH,  ef 60%/  trivial TR   TUBAL LIGATION  1985   UPPER GASTROINTESTINAL ENDOSCOPY  last done 2019   Social History   Social History Narrative   Married, 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  Has to walk up stairs to work and can do w/o Ss.  Lives @ home in [place] with family.   family history includes Arthritis in her sister; Breast cancer in her maternal aunt; Celiac disease in her daughter; Coronary artery disease in her father and mother; Diabetes in her father; Healthy in her brother; Hypertension in her sister; IgA nephropathy in her daughter; Kidney failure in her daughter; Lymphoma in her mother.   Review of Systems As above  Objective:   Physical Exam BP 110/74   Pulse 82   Ht 5' 4"  (1.626 m)   Wt 167 lb (75.8 kg)   BMI 28.67 kg/m  Frail elderly ww chron ill Lungs cta Cor NL S1S2 no rmg Abd obese, soft NT w/ small soft umbilical hernia - reducible Alert and oriented x 3 but slow speech No asterixis

## 2021-10-08 NOTE — Patient Instructions (Signed)
You have been scheduled for an endoscopy and colonoscopy. Please follow the written instructions given to you at your visit today. Please pick up your prep supplies at the pharmacy within the next 1-3 days. If you use inhalers (even only as needed), please bring them with you on the day of your procedure.  I appreciate the opportunity to care for you. Silvano Rusk, MD, [facility]

## 2021-11-06 ENCOUNTER — Ambulatory Visit: Payer: Medicare Other | Attending: Unknown Physician Specialty

## 2021-11-06 ENCOUNTER — Other Ambulatory Visit: Payer: Self-pay

## 2021-11-06 DIAGNOSIS — M6281 Muscle weakness (generalized): Secondary | ICD-10-CM | POA: Diagnosis present

## 2021-11-06 NOTE — Therapy (Signed)
[facility] [place], [place], [address] Phone: [phone]   Fax:  [phone]  Physical Therapy Evaluation  Patient Details  Name: Dawn Moon MRN: [id number] Date of Birth: [birth year] Referring Provider (PT): Vernon Prey   Encounter Date: 11/06/2021   PT End of Session - 11/06/21 1545     Visit Number 1    Number of Visits 12    Date for PT Re-Evaluation 01/09/22    PT Start Time 1302    PT Stop Time 3734    PT Time Calculation (min) 43 min    Activity Tolerance Patient limited by fatigue    Behavior During Therapy [facility] for tasks assessed/performed             Past Medical History:  Diagnosis Date   Ascites    Dysrhythmia    hx of SVT   Encephalopathy, hepatic 05/13/2018   GERD (gastroesophageal reflux disease)    History of exercise stress test    03-07-2010  Stress echo--- no arrhythmias or conduction abnormalilites and negative for ischemia or chest pain   History of kidney stones    History of paroxysmal supraventricular tachycardia    episode 2011  consult w/ dr Caryl Comes --  put on atenolol--  per pt no longer an issue   HTN (hypertension)    cardiologist --  dr Stanford Breed   IBS (irritable bowel syndrome)    diarrhea   Nonalcoholic steatohepatitis (NASH)    Numbness and tingling of left lower extremity    post achilles tendon repair   OA (osteoarthritis)    knees   Pleural effusion on right    hepatic hydrothorax   PMB (postmenopausal bleeding) none recent   thickened endometrium   PONV (postoperative nausea and vomiting)    Recurrent UTI none since 2019   Secondary esophageal varices without bleeding ([place]) 06/03/2018   Vitamin D deficiency    Wears glasses     Past Surgical History:  Procedure Laterality Date   ACHILLES TENDON SURGERY Left 06/2016   BREAST BIOPSY Left 02/2016   benign   COLONOSCOPY  2005   CT CTA CORONARY W/CA SCORE W/CM &/OR WO/CM  01/01/2014   non-obstructive  calcified plaque in pLAD (0-25%), no significant incidental noncardiac findings noted   DILATATION & CURETTAGE/HYSTEROSCOPY WITH MYOSURE N/A 12/02/2020   Procedure: DILATATION & CURETTAGE/HYSTEROSCOPY WITH POLYPECTOMY AND REMOVAL OF VAGINAL LESION;  Surgeon: [name], MD;  Location: [place];  Service: Gynecology;  Laterality: N/A;  request to follow in [place] held (DR. Delilah Shan has two other cases that morning starting 7:30am)   ESOPHAGOGASTRODUODENOSCOPY  01/2014   EXTRACORPOREAL SHOCK WAVE LITHOTRIPSY  2010   KNEE ARTHROPLASTY Right 03/24/2018   Procedure: RIGHT TOTAL KNEE ARTHROPLASTY WITH COMPUTER NAVIGATION;  Surgeon: Rod Can, MD;  [facility];  Service: Orthopedics;  Laterality: Right;  Needs RNFA   KNEE ARTHROSCOPY Right 12/04/2016   Procedure: ARTHROSCOPY KNEE WITH PARTIAL MEDIAL MENISCECTOMY;  Surgeon: Rod Can, MD;  Location: [place];  Service: Orthopedics;  Laterality: Right;   LEFT HEART CATHETERIZATION WITH CORONARY ANGIOGRAM N/A 01/11/2012   Procedure: LEFT HEART CATHETERIZATION WITH CORONARY ANGIOGRAM;  Surgeon: Sherren Mocha, MD;  Location: [facility] CATH LAB;  Service: Cardiovascular;  Laterality: N/A;  widely patent coronary arterires without significant obstructive CAD,  normal LVF, ef 55-56%   TONSILLECTOMY AND ADENOIDECTOMY  child   TRANSTHORACIC ECHOCARDIOGRAM  01/18/2012   mild LVH,  ef 60%/  trivial TR  TUBAL LIGATION  1985   UPPER GASTROINTESTINAL ENDOSCOPY  last done 2019    There were no vitals filed for this visit.    Subjective Assessment - 11/06/21 1304     Subjective Patient reports that she has been unable to work since 10/11 after going to [facility] for a three day evaluation for a liver transplant. She notes that she has been really weak and tired since this time. Her physicians want her to get stronger prior to surgery to help improve her outcome for a potential transplant. She does  not know the time frame for this potential transpalnt.    Pertinent History IBS, chronic low back pain, right TKA, HTN, and liver cerrhosis secondary to NASH    Limitations Walking    How long can you stand comfortably? 10-15 minutes    How long can you walk comfortably? 10-15 minutes    Patient Stated Goals get stronger, walk longer, return to work    Currently in Pain? Yes    Pain Score 3     Pain Location Abdomen    Pain Descriptors / Indicators Tightness    Pain Type Chronic pain    Pain Onset More than a month ago    Pain Frequency Intermittent                [facility] PT Assessment - 11/06/21 0001       Assessment   Medical Diagnosis Liver cirrhosis secondary to NASH    Referring Provider (PT) Vernon Prey    Onset Date/Surgical Date 09/30/21    Next MD Visit 01/2022   CT: 11/14/21   Prior Therapy Yes      Precautions   Precautions None      Restrictions   Weight Bearing Restrictions No      Balance Screen   Has the patient fallen in the past 6 months No    Has the patient had a decrease in activity level because of a fear of falling?  No    Is the patient reluctant to leave their home because of a fear of falling?  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[employer]; full time prior (wants to return to work)   Leisure take pictures, walking, spend time with grandchildren      Cognition   Overall Cognitive Status Within Functional Limits for tasks assessed    Attention Focused    Focused Attention Appears intact    Memory  Appears intact    Awareness Appears intact    Problem Solving Appears intact      Sensation   Additional Comments Patient reports no numbness or tingling      ROM / Strength   AROM / PROM / Strength Strength;AROM      AROM   Overall AROM  Within functional limits for tasks performed      Strength   Strength Assessment Site Hip;Knee;Ankle;Shoulder;Elbow  Right/Left Shoulder Right;Left    Right Shoulder Flexion 3+/5    Right Shoulder ABduction 3+/5    Left Shoulder Flexion 3+/5    Left Shoulder ABduction 3+/5    Right/Left Elbow Right;Left    Right Elbow Flexion 4/5    Right Elbow Extension 4/5    Left Elbow Flexion 4/5    Left Elbow Extension 4/5    Right/Left Hip Right;Left    Right Hip Flexion 3+/5    Left Hip Flexion 3+/5    Right/Left Knee Right;Left    Right Knee Flexion 4/5    Right Knee Extension 4-/5    Left Knee Flexion 4/5    Left Knee Extension 4-/5    Right/Left Ankle Right;Left    Right Ankle Dorsiflexion 4/5    Left Ankle Dorsiflexion 4/5      Transfers   Transfers Stand to Sit;Sit to Stand    Sit to Stand 6: Modified independent (Device/Increase time);Without upper extremity assist;Multiple attempts    Five time sit to stand comments  Only able to complete 4 sit to stand transfers in 50 seconds   limited by fatigue   Stand to Sit 6: Modified independent (Device/Increase time);Without upper extremity assist                        Objective measurements completed on examination: See above findings.       [facility] Adult PT Treatment/Exercise - 11/06/21 0001       Exercises   Exercises Knee/Hip      Knee/Hip Exercises: Seated   Long Arc Quad Both;10 reps                       PT Short Term Goals - 11/06/21 1600       PT SHORT TERM GOAL #1   Title Patient will be able to complete a five time sit to stand test without being limited by fatigue.    Baseline only able to complete 4 reps    Time 4    Period Weeks     Status New    Target Date 12/04/21               PT Long Term Goals - 11/06/21 1557       PT LONG TERM GOAL #1   Title Patient will be independent with her HEP.    Time 6    Period Weeks    Status New    Target Date 12/18/21      PT LONG TERM GOAL #2   Title Patient will be able to stand and walk at least 30 minutes prior to being limited by fatigue.    Baseline 10-15 minutes    Time 6    Period Weeks    Status New    Target Date 12/18/21      PT LONG TERM GOAL #3   Title Patient will be able to complete a five time sit to stand test in 30 seconds or less.    Baseline unable to complete    Time 6    Period Weeks    Status New    Target Date 12/18/21                    Plan - 11/06/21 1546     Clinical Impression Statement Patient is a 65 year old female presenting to physical therapy with global weakness secondary to liver cirrhosis and  NASH. Fatigue significantly limited her ability to complete her initial evaluation as she required prolonged rest breaks between today's objective measures. She was unable to complete a five time sit to stand test due to fatigue. Recommend that she continue with her recommended plan of care to address her impairments to maximize her functional mobility.    Personal Factors and Comorbidities Fitness;Comorbidity 1;Comorbidity 2;Comorbidity 3+    Comorbidities IBS, chronic low back pain, right TKA, HTN, and liver cerrhosis secondary to NASH    Examination-Activity Limitations Locomotion Level;Transfers;Caring for Others;Stand    Examination-Participation Restrictions Occupation;Community Activity;Shop    Stability/Clinical Decision Making Evolving/Moderate complexity    Clinical Decision Making Moderate    Rehab Potential Good    PT Frequency 2x / week    PT Duration 6 weeks    PT Treatment/Interventions ADLs/Self Care Home Management;Electrical Stimulation;Gait training;Stair training;Functional mobility training;Therapeutic  activities;Therapeutic exercise;Balance training;Neuromuscular re-education;Patient/family education;Energy conservation    PT Next Visit Plan nustep and global strengthening    Consulted and Agree with Plan of Care Patient             Patient will benefit from skilled therapeutic intervention in order to improve the following deficits and impairments:  Difficulty walking, Decreased endurance, Decreased activity tolerance, Decreased balance, Decreased mobility, Decreased strength  Visit Diagnosis: Muscle weakness (generalized)     Problem List Patient Active Problem List   Diagnosis Date Noted   Adnexal mass 10/25/2020   Elevated cancer antigen 125 (CA 125) 10/25/2020   Thickened endometrium 10/25/2020   Portal hypertensive gastropathy ([place]) 02/29/2020   Recurrent UTI 06/12/2019   Secondary insomnia 04/10/2019   Anemia 02/28/2019   Thrombocytopenia ([place]) 02/28/2019   Degeneration of lumbar intervertebral disc 08/06/2018   Secondary esophageal varices without bleeding ([place]) 06/03/2018   Encephalopathy, hepatic 05/13/2018   History of paroxysmal supraventricular tachycardia    Liver cirrhosis secondary to NASH ([place]) 04/23/2018   Irritable bowel syndrome-diarrhea predominant 06/15/2017   Gastroesophageal reflux disease 06/15/2017   Hyperlipidemia    Kidney stone    Essential hypertension 02/13/2010    Darlin Coco, PT 11/06/2021, 5:44 PM  [facility] [address] [place], [place], [address] Phone: [phone]   Fax:  [phone]  Name: Dawn Moon MRN: [id number] Date of Birth: 1956-10-01

## 2021-11-10 ENCOUNTER — Encounter (HOSPITAL_COMMUNITY): Payer: Self-pay | Admitting: Internal Medicine

## 2021-11-11 ENCOUNTER — Other Ambulatory Visit: Payer: Self-pay

## 2021-11-11 ENCOUNTER — Ambulatory Visit: Payer: Medicare Other | Admitting: Physical Therapy

## 2021-11-11 DIAGNOSIS — M6281 Muscle weakness (generalized): Secondary | ICD-10-CM | POA: Diagnosis not present

## 2021-11-11 NOTE — Therapy (Signed)
[facility] [place], [place], [address] Phone: [phone]   Fax:  [phone]  Physical Therapy Treatment  Patient Details  Name: Baudelia Schroepfer MRN: [id number] Date of Birth: 1956/06/15 Referring Provider (PT): Vernon Prey   Encounter Date: 11/11/2021   PT End of Session - 11/11/21 0918     Visit Number 2    Number of Visits 12    Date for PT Re-Evaluation 01/09/22    PT Start Time 0900    PT Stop Time 6283    PT Time Calculation (min) 44 min    Activity Tolerance Patient limited by fatigue;Treatment limited secondary to medical complications (Comment)    Behavior During Therapy [facility] for tasks assessed/performed             Past Medical History:  Diagnosis Date   Ascites    Dysrhythmia    hx of SVT   Encephalopathy, hepatic 05/13/2018   GERD (gastroesophageal reflux disease)    History of exercise stress test    03-07-2010  Stress echo--- no arrhythmias or conduction abnormalilites and negative for ischemia or chest pain   History of kidney stones    History of paroxysmal supraventricular tachycardia    episode 2011  consult w/ dr Caryl Comes --  put on atenolol--  per pt no longer an issue   HTN (hypertension)    cardiologist --  dr Stanford Breed   IBS (irritable bowel syndrome)    diarrhea   Nonalcoholic steatohepatitis (NASH)    Numbness and tingling of left lower extremity    post achilles tendon repair   OA (osteoarthritis)    knees   Pleural effusion on right    hepatic hydrothorax   PMB (postmenopausal bleeding) none recent   thickened endometrium   PONV (postoperative nausea and vomiting)    Recurrent UTI none since 2019   Secondary esophageal varices without bleeding ([place]) 06/03/2018   Vitamin D deficiency    Wears glasses     Past Surgical History:  Procedure Laterality Date   ACHILLES TENDON SURGERY Left 06/2016   BREAST BIOPSY Left 02/2016   benign   COLONOSCOPY  2005   CT CTA CORONARY  W/CA SCORE W/CM &/OR WO/CM  01/01/2014   non-obstructive calcified plaque in pLAD (0-25%), no significant incidental noncardiac findings noted   DILATATION & CURETTAGE/HYSTEROSCOPY WITH MYOSURE N/A 12/02/2020   Procedure: DILATATION & CURETTAGE/HYSTEROSCOPY WITH POLYPECTOMY AND REMOVAL OF VAGINAL LESION;  Surgeon: [name], MD;  Location: [place];  Service: Gynecology;  Laterality: N/A;  request to follow in [place] held (DR. Delilah Shan has two other cases that morning starting 7:30am)   ESOPHAGOGASTRODUODENOSCOPY  01/2014   EXTRACORPOREAL SHOCK WAVE LITHOTRIPSY  2010   KNEE ARTHROPLASTY Right 03/24/2018   Procedure: RIGHT TOTAL KNEE ARTHROPLASTY WITH COMPUTER NAVIGATION;  Surgeon: Rod Can, MD;  Location: [facility];  Service: Orthopedics;  Laterality: Right;  Needs RNFA   KNEE ARTHROSCOPY Right 12/04/2016   Procedure: ARTHROSCOPY KNEE WITH PARTIAL MEDIAL MENISCECTOMY;  Surgeon: Rod Can, MD;  Location: [place];  Service: Orthopedics;  Laterality: Right;   LEFT HEART CATHETERIZATION WITH CORONARY ANGIOGRAM N/A 01/11/2012   Procedure: LEFT HEART CATHETERIZATION WITH CORONARY ANGIOGRAM;  Surgeon: Sherren Mocha, MD;  Location: [facility] CATH LAB;  Service: Cardiovascular;  Laterality: N/A;  widely patent coronary arterires without significant obstructive CAD,  normal LVF, ef 55-56%   TONSILLECTOMY AND ADENOIDECTOMY  child   TRANSTHORACIC ECHOCARDIOGRAM  01/18/2012   mild LVH,  ef 60%/  trivial TR   TUBAL LIGATION  1985   UPPER GASTROINTESTINAL ENDOSCOPY  last done 2019    There were no vitals filed for this visit.   Subjective Assessment - 11/11/21 0903     Subjective COVID-19 screening performed upon arrival. Patient reported feeling ok today. Feeling weakness a lot.    Pertinent History IBS, chronic low back pain, right TKA, HTN, and liver cerrhosis secondary to NASH    Limitations Walking    How long can you stand comfortably? 10-15  minutes    How long can you walk comfortably? 10-15 minutes    Patient Stated Goals get stronger, walk longer, return to work    Currently in Pain? No/denies                               [facility] Adult PT Treatment/Exercise - 11/11/21 0001       Knee/Hip Exercises: Stretches   Passive Hamstring Stretch Both;1 rep;30 seconds      Knee/Hip Exercises: Aerobic   Nustep L3 x8 min UE/LE 28-30 SPM, monitored      Knee/Hip Exercises: Standing   Heel Raises Both;1 set;10 reps    Other Standing Knee Exercises standing marching 2x10 with abdominal bracing/holding on for support      Knee/Hip Exercises: Seated   Long Arc Quad Strengthening;Both;1 set;10 reps   5 sec holds   Ball Squeeze 20 reps 5 sec    Other Seated Knee/Hip Exercises seated abdominal bracing 5sec hold 2x10    Sit to Sand 5 reps;with UE support   gut focus                      PT Short Term Goals - 11/11/21 0910       PT SHORT TERM GOAL #1   Title Patient will be able to complete a five time sit to stand test without being limited by fatigue.    Baseline only able to complete 4 reps    Time 4    Period Weeks    Status On-going    Target Date 12/04/21               PT Long Term Goals - 11/11/21 0910       PT LONG TERM GOAL #1   Title Patient will be independent with her HEP.    Time 6    Period Weeks    Status On-going      PT LONG TERM GOAL #2   Title Patient will be able to stand and walk at least 30 minutes prior to being limited by fatigue.    Baseline 10-15 minutes    Time 6    Period Weeks    Status On-going      PT LONG TERM GOAL #3   Title Patient will be able to complete a five time sit to stand test in 30 seconds or less.    Baseline unable to complete    Time 6    Period Weeks    Status On-going                   Plan - 11/11/21 0919     Clinical Impression Statement Patient limited today due to other medical issues and fatigue yet was able  to progress with light strengthening today. Patient limited with current ADL's and attempts light walking at home. Current goals ongoing today.  No HEP given today. Will progress per tolerance.    Personal Factors and Comorbidities Fitness;Comorbidity 1;Comorbidity 2;Comorbidity 3+    Comorbidities IBS, chronic low back pain, right TKA, HTN, and liver cerrhosis secondary to NASH    Examination-Activity Limitations Locomotion Level;Transfers;Caring for Others;Stand    Examination-Participation Restrictions Occupation;Community Activity;Shop    Stability/Clinical Decision Making Evolving/Moderate complexity    Rehab Potential Good    PT Frequency 2x / week    PT Duration 6 weeks    PT Treatment/Interventions ADLs/Self Care Home Management;Electrical Stimulation;Gait training;Stair training;Functional mobility training;Therapeutic activities;Therapeutic exercise;Balance training;Neuromuscular re-education;Patient/family education;Energy conservation    PT Next Visit Plan cont with nustep and global strengthening, progress per tolerance and issue HEP    Consulted and Agree with Plan of Care Patient             Patient will benefit from skilled therapeutic intervention in order to improve the following deficits and impairments:  Difficulty walking, Decreased endurance, Decreased activity tolerance, Decreased balance, Decreased mobility, Decreased strength  Visit Diagnosis: Muscle weakness (generalized)     Problem List Patient Active Problem List   Diagnosis Date Noted   Adnexal mass 10/25/2020   Elevated cancer antigen 125 (CA 125) 10/25/2020   Thickened endometrium 10/25/2020   Portal hypertensive gastropathy ([place]) 02/29/2020   Recurrent UTI 06/12/2019   Secondary insomnia 04/10/2019   Anemia 02/28/2019   Thrombocytopenia ([place]) 02/28/2019   Degeneration of lumbar intervertebral disc 08/06/2018   Secondary esophageal varices without bleeding ([place]) 06/03/2018   Encephalopathy,  hepatic 05/13/2018   History of paroxysmal supraventricular tachycardia    Liver cirrhosis secondary to NASH ([place]) 04/23/2018   Irritable bowel syndrome-diarrhea predominant 06/15/2017   Gastroesophageal reflux disease 06/15/2017   Hyperlipidemia    Kidney stone    Essential hypertension 02/13/2010    Tiyon Sanor P, PTA 11/11/2021, 9:44 AM  [facility] [address] [place], [place], [address] Phone: [phone]   Fax:  [phone]  Name: Dietrich Ke MRN: [id number] Date of Birth: [birth year]

## 2021-11-17 ENCOUNTER — Other Ambulatory Visit: Payer: Self-pay

## 2021-11-17 ENCOUNTER — Ambulatory Visit: Payer: Medicare Other

## 2021-11-17 DIAGNOSIS — M6281 Muscle weakness (generalized): Secondary | ICD-10-CM

## 2021-11-17 NOTE — Therapy (Signed)
[facility] [place], [place], [address] Phone: [phone]   Fax:  [phone]  Physical Therapy Treatment  Patient Details  Name: Dawn Moon MRN: [id number] Date of Birth: October 03, 1956 Referring Provider (PT): Vernon Prey   Encounter Date: 11/17/2021   PT End of Session - 11/17/21 0823     Visit Number 3    Number of Visits 12    Date for PT Re-Evaluation 01/09/22    PT Start Time 0817    PT Stop Time 0901    PT Time Calculation (min) 44 min    Activity Tolerance Patient limited by fatigue;Treatment limited secondary to medical complications (Comment)    Behavior During Therapy [facility] for tasks assessed/performed             Past Medical History:  Diagnosis Date   Ascites    Dysrhythmia    hx of SVT   Encephalopathy, hepatic 05/13/2018   GERD (gastroesophageal reflux disease)    History of exercise stress test    03-07-2010  Stress echo--- no arrhythmias or conduction abnormalilites and negative for ischemia or chest pain   History of kidney stones    History of paroxysmal supraventricular tachycardia    episode 2011  consult w/ dr Caryl Comes --  put on atenolol--  per pt no longer an issue   HTN (hypertension)    cardiologist --  dr Stanford Breed   IBS (irritable bowel syndrome)    diarrhea   Nonalcoholic steatohepatitis (NASH)    Numbness and tingling of left lower extremity    post achilles tendon repair   OA (osteoarthritis)    knees   Pleural effusion on right    hepatic hydrothorax   PMB (postmenopausal bleeding) none recent   thickened endometrium   PONV (postoperative nausea and vomiting)    Recurrent UTI none since 2019   Secondary esophageal varices without bleeding ([place]) 06/03/2018   Vitamin D deficiency    Wears glasses     Past Surgical History:  Procedure Laterality Date   ACHILLES TENDON SURGERY Left 06/2016   BREAST BIOPSY Left 02/2016   benign   COLONOSCOPY  2005   CT CTA CORONARY  W/CA SCORE W/CM &/OR WO/CM  01/01/2014   non-obstructive calcified plaque in pLAD (0-25%), no significant incidental noncardiac findings noted   DILATATION & CURETTAGE/HYSTEROSCOPY WITH MYOSURE N/A 12/02/2020   Procedure: DILATATION & CURETTAGE/HYSTEROSCOPY WITH POLYPECTOMY AND REMOVAL OF VAGINAL LESION;  Surgeon: [name], MD;  Location: [place];  Service: Gynecology;  Laterality: N/A;  request to follow in [place] held (DR. Delilah Shan has two other cases that morning starting 7:30am)   ESOPHAGOGASTRODUODENOSCOPY  01/2014   EXTRACORPOREAL SHOCK WAVE LITHOTRIPSY  2010   KNEE ARTHROPLASTY Right 03/24/2018   Procedure: RIGHT TOTAL KNEE ARTHROPLASTY WITH COMPUTER NAVIGATION;  Surgeon: Rod Can, MD;  Location: [facility];  Service: Orthopedics;  Laterality: Right;  Needs RNFA   KNEE ARTHROSCOPY Right 12/04/2016   Procedure: ARTHROSCOPY KNEE WITH PARTIAL MEDIAL MENISCECTOMY;  Surgeon: Rod Can, MD;  Location: [place];  Service: Orthopedics;  Laterality: Right;   LEFT HEART CATHETERIZATION WITH CORONARY ANGIOGRAM N/A 01/11/2012   Procedure: LEFT HEART CATHETERIZATION WITH CORONARY ANGIOGRAM;  Surgeon: Sherren Mocha, MD;  Location: [facility] CATH LAB;  Service: Cardiovascular;  Laterality: N/A;  widely patent coronary arterires without significant obstructive CAD,  normal LVF, ef 55-56%   TONSILLECTOMY AND ADENOIDECTOMY  child   TRANSTHORACIC ECHOCARDIOGRAM  01/18/2012   mild LVH,  ef 60%/  trivial TR   TUBAL LIGATION  1985   UPPER GASTROINTESTINAL ENDOSCOPY  last done 2019    There were no vitals filed for this visit.   Subjective Assessment - 11/17/21 0821     Subjective COVID-19 screening performed upon arrival. Patient reports that she had her heart CT scan and was told that she has "some blockages in two of her veins." She notes that her back was sore and bothering her after her last appointment. She also notes that she was exhausted after  getting ready for Thanksgiving.    Pertinent History IBS, chronic low back pain, right TKA, HTN, and liver cerrhosis secondary to NASH    Limitations Walking    How long can you stand comfortably? 10-15 minutes    How long can you walk comfortably? 10-15 minutes    Patient Stated Goals get stronger, walk longer, return to work    Currently in Pain? Yes    Pain Score 5     Pain Location Back    Pain Descriptors / Indicators Sore                               [facility] Adult PT Treatment/Exercise - 11/17/21 0001       Knee/Hip Exercises: Aerobic   Nustep L3 x10 min UE/LE 28-30 SPM, monitored      Knee/Hip Exercises: Standing   Hip Flexion Both;Knee bent   2 minutes   Other Standing Knee Exercises Side stepping   BUE support; 3 minutes     Knee/Hip Exercises: Seated   Long Arc Quad Both;2 sets;10 reps   5 second hold   Ball Squeeze 2x10 5 second hold                       PT Short Term Goals - 11/11/21 0910       PT SHORT TERM GOAL #1   Title Patient will be able to complete a five time sit to stand test without being limited by fatigue.    Baseline only able to complete 4 reps    Time 4    Period Weeks    Status On-going    Target Date 12/04/21               PT Long Term Goals - 11/11/21 0910       PT LONG TERM GOAL #1   Title Patient will be independent with her HEP.    Time 6    Period Weeks    Status On-going      PT LONG TERM GOAL #2   Title Patient will be able to stand and walk at least 30 minutes prior to being limited by fatigue.    Baseline 10-15 minutes    Time 6    Period Weeks    Status On-going      PT LONG TERM GOAL #3   Title Patient will be able to complete a five time sit to stand test in 30 seconds or less.    Baseline unable to complete    Time 6    Period Weeks    Status On-going                   Plan - 11/17/21 7588     Clinical Impression Statement Patient was progressed with multiple  new interventions for improved strength and endurance. She required minimal cuing  with each of today's interventions for proper exercise biomechanics. She required multiple prolonged seated rest breaks throughout treatment due to fatigue. She reported feeling tired upon the conclusion of treatment. She continues to require skilled physical therapy to address her remaining impairments to maximize her functional mobility.    Personal Factors and Comorbidities Fitness;Comorbidity 1;Comorbidity 2;Comorbidity 3+    Comorbidities IBS, chronic low back pain, right TKA, HTN, and liver cerrhosis secondary to NASH    Examination-Activity Limitations Locomotion Level;Transfers;Caring for Others;Stand    Examination-Participation Restrictions Occupation;Community Activity;Shop    Stability/Clinical Decision Making Evolving/Moderate complexity    Rehab Potential Good    PT Frequency 2x / week    PT Duration 6 weeks    PT Treatment/Interventions ADLs/Self Care Home Management;Electrical Stimulation;Gait training;Stair training;Functional mobility training;Therapeutic activities;Therapeutic exercise;Balance training;Neuromuscular re-education;Patient/family education;Energy conservation    PT Next Visit Plan cont with nustep and global strengthening, progress per tolerance and issue HEP    Consulted and Agree with Plan of Care Patient             Patient will benefit from skilled therapeutic intervention in order to improve the following deficits and impairments:  Difficulty walking, Decreased endurance, Decreased activity tolerance, Decreased balance, Decreased mobility, Decreased strength  Visit Diagnosis: Muscle weakness (generalized)     Problem List Patient Active Problem List   Diagnosis Date Noted   Adnexal mass 10/25/2020   Elevated cancer antigen 125 (CA 125) 10/25/2020   Thickened endometrium 10/25/2020   Portal hypertensive gastropathy ([place]) 02/29/2020   Recurrent UTI 06/12/2019    Secondary insomnia 04/10/2019   Anemia 02/28/2019   Thrombocytopenia ([place]) 02/28/2019   Degeneration of lumbar intervertebral disc 08/06/2018   Secondary esophageal varices without bleeding ([place]) 06/03/2018   Encephalopathy, hepatic 05/13/2018   History of paroxysmal supraventricular tachycardia    Liver cirrhosis secondary to NASH ([place]) 04/23/2018   Irritable bowel syndrome-diarrhea predominant 06/15/2017   Gastroesophageal reflux disease 06/15/2017   Hyperlipidemia    Kidney stone    Essential hypertension 02/13/2010    Darlin Coco, PT 11/17/2021, 9:11 AM  [facility] [address] [place], [place], [address] Phone: [phone]   Fax:  [phone]  Name: Dawn Moon MRN: [id number] Date of Birth: 06/27/1956

## 2021-11-18 ENCOUNTER — Ambulatory Visit (INDEPENDENT_AMBULATORY_CARE_PROVIDER_SITE_OTHER): Payer: Medicare Other | Admitting: Cardiology

## 2021-11-18 ENCOUNTER — Encounter: Payer: Self-pay | Admitting: Cardiology

## 2021-11-18 VITALS — BP 106/68 | HR 67 | Ht 64.0 in | Wt 160.2 lb

## 2021-11-18 DIAGNOSIS — I10 Essential (primary) hypertension: Secondary | ICD-10-CM

## 2021-11-18 DIAGNOSIS — E8770 Fluid overload, unspecified: Secondary | ICD-10-CM

## 2021-11-18 DIAGNOSIS — R002 Palpitations: Secondary | ICD-10-CM

## 2021-11-18 DIAGNOSIS — I2510 Atherosclerotic heart disease of native coronary artery without angina pectoris: Secondary | ICD-10-CM

## 2021-11-18 MED ORDER — SODIUM CHLORIDE 0.9% FLUSH: 3.0000 mL | Freq: Two times a day (BID) | INTRAVENOUS | Status: DC

## 2021-11-18 NOTE — Progress Notes (Signed)
HPI: FU CAD, hypertension and volume excess. CardioNet 2011 showed 4 beats of nonsustained ventricular tachycardia; also brief atrial tachycardia; note some of symptoms also noted to be sinus tachycardia. Patient was seen by Dr. Caryl Comes and started on verapamil. Her symptoms did not improve and she was changed to a beta blocker. Her symptoms improved with atenolol. Patient admitted April 2019 and underwent right knee replacement.  Following discharge she developed worsening dyspnea. CTA May 2019 showed no pulmonary embolus.  There was a large right pleural effusion and lobulated liver compatible with cirrhosis. She was diagnosed with cirrhosis due to NASH. She has been followed by gastroenterology as an outpatient.  She was referred to [facility] for consideration of transplant. EGD March 2021 showed esophageal varices.  CTA September 2021 showed 5 mm left upper lobe pulmonary nodule and follow-up recommended 12 months if pt high risk, cirrhotic liver with portal venous hypertension including perigastric and paraesophageal varices and cystic lesion in the right adnexal space.  Echocardiogram at [facility] October 2022 showed normal LV function and late positive microcavitation study consistent with extracardiac shunting.  Stress echocardiogram October 2022 suggested wall motion abnormality in the inferior wall.  Cardiac CTA at [facility] November 2022 showed calcium score 53.9, 70 to 90% in the proximal to mid LAD with 30 to 50% mid to distal LAD, less than 30% first diagonal and small pericardial effusion.  FFR suggested significant stenosis in the distal LAD.  Since last seen, she has significant fatigue.  She has dyspnea on exertion that she attributes to fatigue.  No orthopnea, PND, pedal edema, chest pain or syncope.  Current Outpatient Medications  Medication Sig Dispense Refill   furosemide (LASIX) 40 MG tablet TAKE 1 TABLET DAILY (Patient taking differently: 40 mg.) 90 tablet 3   lactulose (CHRONULAC)  10 GM/15ML solution Take 10 g by mouth 3 (three) times daily.     Multiple Vitamins-Minerals (CENTRUM MINIS WOMEN 50+ PO) Take 1 tablet by mouth daily.     nadolol (CORGARD) 20 MG tablet Take 1 tablet (20 mg total) by mouth daily.     omeprazole (PRILOSEC) 20 MG capsule Take 20 mg by mouth daily.     potassium chloride SA (KLOR-CON) 20 MEQ tablet TAKE 1 TABLET ONCE DAILY 90 tablet 3   promethazine (PHENERGAN) 25 MG tablet Take 1 tablet (25 mg total) by mouth every 6 (six) hours as needed for nausea or vomiting. 30 tablet 1   spironolactone (ALDACTONE) 100 MG tablet TAKE 1 TABLET DAILY 90 tablet 3   diphenoxylate-atropine (LOMOTIL) 2.5-0.025 MG tablet TAKE  (1) OR (2)  TABLETS EVERY SIX HOURS AS NEEDED (Patient not taking: Reported on 11/18/2021) 60 tablet 2   Omeprazole Magnesium 10 MG PACK daily. (Patient not taking: Reported on 11/18/2021)     PEG-KCl-NaCl-NaSulf-Na Asc-C (PLENVU) 140 g SOLR Take 1 kit by mouth as directed. (Patient not taking: Reported on 11/18/2021) 1 each KIT   rifaximin (XIFAXAN) 550 MG TABS tablet Take by mouth 2 (two) times daily. (Patient not taking: Reported on 11/18/2021)     No current facility-administered medications for this visit.     Past Medical History:  Diagnosis Date   Ascites    Dysrhythmia    hx of SVT   Encephalopathy, hepatic 05/13/2018   GERD (gastroesophageal reflux disease)    History of exercise stress test    03-07-2010  Stress echo--- no arrhythmias or conduction abnormalilites and negative for ischemia or chest pain   History  of kidney stones    History of paroxysmal supraventricular tachycardia    episode 2011  consult w/ dr Caryl Comes --  put on atenolol--  per pt no longer an issue   HTN (hypertension)    cardiologist --  dr Stanford Breed   IBS (irritable bowel syndrome)    diarrhea   Nonalcoholic steatohepatitis (NASH)    Numbness and tingling of left lower extremity    post achilles tendon repair   OA (osteoarthritis)    knees   Pleural  effusion on right    hepatic hydrothorax   PMB (postmenopausal bleeding) none recent   thickened endometrium   PONV (postoperative nausea and vomiting)    Recurrent UTI none since 2019   Secondary esophageal varices without bleeding ([place]) 06/03/2018   Vitamin D deficiency    Wears glasses     Past Surgical History:  Procedure Laterality Date   ACHILLES TENDON SURGERY Left 06/2016   BREAST BIOPSY Left 02/2016   benign   COLONOSCOPY  2005   CT CTA CORONARY W/CA SCORE W/CM &/OR WO/CM  01/01/2014   non-obstructive calcified plaque in pLAD (0-25%), no significant incidental noncardiac findings noted   DILATATION & CURETTAGE/HYSTEROSCOPY WITH MYOSURE N/A 12/02/2020   Procedure: DILATATION & CURETTAGE/HYSTEROSCOPY WITH POLYPECTOMY AND REMOVAL OF VAGINAL LESION;  Surgeon: [name], MD;  Location: [facility];  Service: Gynecology;  Laterality: N/A;  request to follow in [place] held (DR. Delilah Shan has two other cases that morning starting 7:30am)   ESOPHAGOGASTRODUODENOSCOPY  01/2014   EXTRACORPOREAL SHOCK WAVE LITHOTRIPSY  2010   KNEE ARTHROPLASTY Right 03/24/2018   Procedure: RIGHT TOTAL KNEE ARTHROPLASTY WITH COMPUTER NAVIGATION;  Surgeon: Rod Can, MD;  Location: [facility];  Service: Orthopedics;  Laterality: Right;  Needs RNFA   KNEE ARTHROSCOPY Right 12/04/2016   Procedure: ARTHROSCOPY KNEE WITH PARTIAL MEDIAL MENISCECTOMY;  Surgeon: Rod Can, MD;  Location: [place];  Service: Orthopedics;  Laterality: Right;   LEFT HEART CATHETERIZATION WITH CORONARY ANGIOGRAM N/A 01/11/2012   Procedure: LEFT HEART CATHETERIZATION WITH CORONARY ANGIOGRAM;  Surgeon: Sherren Mocha, MD;  Location: [facility] CATH LAB;  Service: Cardiovascular;  Laterality: N/A;  widely patent coronary arterires without significant obstructive CAD,  normal LVF, ef 55-56%   TONSILLECTOMY AND ADENOIDECTOMY  child   TRANSTHORACIC ECHOCARDIOGRAM  01/18/2012   mild LVH,  ef  60%/  trivial TR   TUBAL LIGATION  1985   UPPER GASTROINTESTINAL ENDOSCOPY  last done 2019    Social History   Socioeconomic History   Marital status: Married    Spouse name: Not on file   Number of children: Not on file   Years of education: Not on file   Highest education level: Not on file  Occupational History   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  Has to  walk up stairs to work and can do w/o Ss.  Lives @ home in [place] with family.   Social Determinants of Health   Financial Resource Strain: Not on file  Food Insecurity: Not on file  Transportation Needs: Not on file  Physical Activity: Not on file  Stress: Not on file  Social Connections: Not on file  Intimate Partner Violence: Not on file    Family History  Problem Relation Age of Onset   Lymphoma Mother        non-hodgkins - died @ 76   Coronary artery disease Mother    Coronary artery disease Father        CABG in his 83s, died @ 75   Diabetes Father    Healthy Brother    Hypertension Sister    Arthritis Sister    IgA nephropathy Daughter    Kidney failure Daughter    Celiac disease Daughter    Breast cancer Maternal Aunt    Colon cancer Neg Hx    Esophageal cancer Neg Hx    Stomach cancer Neg Hx    Rectal cancer Neg Hx    Ovarian cancer Neg Hx    Cervical cancer Neg Hx     ROS: no fevers or chills, productive cough, hemoptysis,  dysphasia, odynophagia, melena, hematochezia, dysuria, hematuria, rash, seizure activity, orthopnea, PND, pedal edema, claudication. Remaining systems are negative.  Physical Exam: Well-developed well-nourished in no acute distress.  Skin is warm and dry.  HEENT is normal.  Neck is supple.  Chest is clear to auscultation with normal expansion.  Cardiovascular exam is regular rate and rhythm.  Abdominal exam nontender or distended. No masses palpated. Extremities show no edema. neuro grossly intact  ECG-normal sinus rhythm at a rate of 67, no ST changes.  Personally reviewed  A/P  1 coronary artery disease-this was noted on recent cardiac CTA at [facility]  FFR suggests significant ischemia in the distal LAD distribution.  She will require cardiac catheterization prior to being listed for liver transplant.  We will arrange.  The risk and benefits including myocardial infarction, CVA and death discussed and she agrees to proceed.  Given baseline creatinine of 1.4 hold Lasix and spironolactone the day before and the morning of her procedure and resume after.  We will limit dye and no ventriculogram.  She will take aspirin the day of her procedure and continue with significant coronary artery disease as documented.  2 volume excess-secondary to cirrhosis.  Continue diuretics other than as outlined above.  3 hypertension-blood pressure controlled.  Continue present medical regimen.  4 palpitations/nonsustained ventricular tachycardia-symptoms are controlled and LV function is normal.  Continue beta-blocker.  5 cirrhosis-she is being considered for liver transplant.  Follow-up [employer].  6 chronic stage IIIa kidney disease-most recent creatinine 1.4.  Hold diuretics prior to catheterization as outlined above.  Check potassium, renal function 4 days after procedure.  Kirk Ruths, MD

## 2021-11-18 NOTE — H&P (View-Only) (Signed)
HPI: FU CAD, hypertension and volume excess. CardioNet 2011 showed 4 beats of nonsustained ventricular tachycardia; also brief atrial tachycardia; note some of symptoms also noted to be sinus tachycardia. Patient was seen by Dr. Caryl Comes and started on verapamil. Her symptoms did not improve and she was changed to a beta blocker. Her symptoms improved with atenolol. Patient admitted April 2019 and underwent right knee replacement.  Following discharge she developed worsening dyspnea. CTA May 2019 showed no pulmonary embolus.  There was a large right pleural effusion and lobulated liver compatible with cirrhosis. She was diagnosed with cirrhosis due to NASH. She has been followed by gastroenterology as an outpatient.  She was referred to [facility] for consideration of transplant. EGD March 2021 showed esophageal varices.  CTA September 2021 showed 5 mm left upper lobe pulmonary nodule and follow-up recommended 12 months if pt high risk, cirrhotic liver with portal venous hypertension including perigastric and paraesophageal varices and cystic lesion in the right adnexal space.  Echocardiogram at [facility] October 2022 showed normal LV function and late positive microcavitation study consistent with extracardiac shunting.  Stress echocardiogram October 2022 suggested wall motion abnormality in the inferior wall.  Cardiac CTA at [facility] November 2022 showed calcium score 53.9, 70 to 90% in the proximal to mid LAD with 30 to 50% mid to distal LAD, less than 30% first diagonal and small pericardial effusion.  FFR suggested significant stenosis in the distal LAD.  Since last seen, she has significant fatigue.  She has dyspnea on exertion that she attributes to fatigue.  No orthopnea, PND, pedal edema, chest pain or syncope.  Current Outpatient Medications  Medication Sig Dispense Refill   furosemide (LASIX) 40 MG tablet TAKE 1 TABLET DAILY (Patient taking differently: 40 mg.) 90 tablet 3   lactulose (CHRONULAC)  10 GM/15ML solution Take 10 g by mouth 3 (three) times daily.     Multiple Vitamins-Minerals (CENTRUM MINIS WOMEN 50+ PO) Take 1 tablet by mouth daily.     nadolol (CORGARD) 20 MG tablet Take 1 tablet (20 mg total) by mouth daily.     omeprazole (PRILOSEC) 20 MG capsule Take 20 mg by mouth daily.     potassium chloride SA (KLOR-CON) 20 MEQ tablet TAKE 1 TABLET ONCE DAILY 90 tablet 3   promethazine (PHENERGAN) 25 MG tablet Take 1 tablet (25 mg total) by mouth every 6 (six) hours as needed for nausea or vomiting. 30 tablet 1   spironolactone (ALDACTONE) 100 MG tablet TAKE 1 TABLET DAILY 90 tablet 3   diphenoxylate-atropine (LOMOTIL) 2.5-0.025 MG tablet TAKE  (1) OR (2)  TABLETS EVERY SIX HOURS AS NEEDED (Patient not taking: Reported on 11/18/2021) 60 tablet 2   Omeprazole Magnesium 10 MG PACK daily. (Patient not taking: Reported on 11/18/2021)     PEG-KCl-NaCl-NaSulf-Na Asc-C (PLENVU) 140 g SOLR Take 1 kit by mouth as directed. (Patient not taking: Reported on 11/18/2021) 1 each KIT   rifaximin (XIFAXAN) 550 MG TABS tablet Take by mouth 2 (two) times daily. (Patient not taking: Reported on 11/18/2021)     No current facility-administered medications for this visit.     Past Medical History:  Diagnosis Date   Ascites    Dysrhythmia    hx of SVT   Encephalopathy, hepatic 05/13/2018   GERD (gastroesophageal reflux disease)    History of exercise stress test    03-07-2010  Stress echo--- no arrhythmias or conduction abnormalilites and negative for ischemia or chest pain   History  of kidney stones    History of paroxysmal supraventricular tachycardia    episode 2011  consult w/ dr Caryl Comes --  put on atenolol--  per pt no longer an issue   HTN (hypertension)    cardiologist --  dr Stanford Breed   IBS (irritable bowel syndrome)    diarrhea   Nonalcoholic steatohepatitis (NASH)    Numbness and tingling of left lower extremity    post achilles tendon repair   OA (osteoarthritis)    knees   Pleural  effusion on right    hepatic hydrothorax   PMB (postmenopausal bleeding) none recent   thickened endometrium   PONV (postoperative nausea and vomiting)    Recurrent UTI none since 2019   Secondary esophageal varices without bleeding ([place]) 06/03/2018   Vitamin D deficiency    Wears glasses     Past Surgical History:  Procedure Laterality Date   ACHILLES TENDON SURGERY Left 06/2016   BREAST BIOPSY Left 02/2016   benign   COLONOSCOPY  2005   CT CTA CORONARY W/CA SCORE W/CM &/OR WO/CM  01/01/2014   non-obstructive calcified plaque in pLAD (0-25%), no significant incidental noncardiac findings noted   DILATATION & CURETTAGE/HYSTEROSCOPY WITH MYOSURE N/A 12/02/2020   Procedure: DILATATION & CURETTAGE/HYSTEROSCOPY WITH POLYPECTOMY AND REMOVAL OF VAGINAL LESION;  Surgeon: [name], MD;  Location: [facility];  Service: Gynecology;  Laterality: N/A;  request to follow in [place] held (DR. Delilah Shan has two other cases that morning starting 7:30am)   ESOPHAGOGASTRODUODENOSCOPY  01/2014   EXTRACORPOREAL SHOCK WAVE LITHOTRIPSY  2010   KNEE ARTHROPLASTY Right 03/24/2018   Procedure: RIGHT TOTAL KNEE ARTHROPLASTY WITH COMPUTER NAVIGATION;  Surgeon: Rod Can, MD;  Location[facility];  Service: Orthopedics;  Laterality: Right;  Needs RNFA   KNEE ARTHROSCOPY Right 12/04/2016   Procedure: ARTHROSCOPY KNEE WITH PARTIAL MEDIAL MENISCECTOMY;  Surgeon: Rod Can, MD;  Location: [place];  Service: Orthopedics;  Laterality: Right;   LEFT HEART CATHETERIZATION WITH CORONARY ANGIOGRAM N/A 01/11/2012   Procedure: LEFT HEART CATHETERIZATION WITH CORONARY ANGIOGRAM;  Surgeon: Sherren Mocha, MD;  Location: [facility] CATH LAB;  Service: Cardiovascular;  Laterality: N/A;  widely patent coronary arterires without significant obstructive CAD,  normal LVF, ef 55-56%   TONSILLECTOMY AND ADENOIDECTOMY  child   TRANSTHORACIC ECHOCARDIOGRAM  01/18/2012   mild LVH,  ef  60%/  trivial TR   TUBAL LIGATION  1985   UPPER GASTROINTESTINAL ENDOSCOPY  last done 2019    Social History   Socioeconomic History   Marital status: Married    Spouse name: Not on file   Number of children: Not on file   Years of education: Not on file   Highest education level: Not on file  Occupational History   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  Has to  walk up stairs to work and can do w/o Ss.  Lives @ home in [place] with family.   Social Determinants of Health   Financial Resource Strain: Not on file  Food Insecurity: Not on file  Transportation Needs: Not on file  Physical Activity: Not on file  Stress: Not on file  Social Connections: Not on file  Intimate Partner Violence: Not on file    Family History  Problem Relation Age of Onset   Lymphoma Mother        non-hodgkins - died @ 76   Coronary artery disease Mother    Coronary artery disease Father        CABG in his 83s, died @ 75   Diabetes Father    Healthy Brother    Hypertension Sister    Arthritis Sister    IgA nephropathy Daughter    Kidney failure Daughter    Celiac disease Daughter    Breast cancer Maternal Aunt    Colon cancer Neg Hx    Esophageal cancer Neg Hx    Stomach cancer Neg Hx    Rectal cancer Neg Hx    Ovarian cancer Neg Hx    Cervical cancer Neg Hx     ROS: no fevers or chills, productive cough, hemoptysis,  dysphasia, odynophagia, melena, hematochezia, dysuria, hematuria, rash, seizure activity, orthopnea, PND, pedal edema, claudication. Remaining systems are negative.  Physical Exam: Well-developed well-nourished in no acute distress.  Skin is warm and dry.  HEENT is normal.  Neck is supple.  Chest is clear to auscultation with normal expansion.  Cardiovascular exam is regular rate and rhythm.  Abdominal exam nontender or distended. No masses palpated. Extremities show no edema. neuro grossly intact  ECG-normal sinus rhythm at a rate of 67, no ST changes.  Personally reviewed  A/P  1 coronary artery disease-this was noted on recent cardiac CTA at [facility].  FFR suggests significant ischemia in the distal LAD distribution.  She will require cardiac catheterization prior to being listed for liver transplant.  We will arrange.  The risk and benefits including myocardial infarction, CVA and death discussed and she agrees to proceed.  Given baseline creatinine of 1.4 hold Lasix and spironolactone the day before and the morning of her procedure and resume after.  We will limit dye and no ventriculogram.  She will take aspirin the day of her procedure and continue with significant coronary artery disease as documented.  2 volume excess-secondary to cirrhosis.  Continue diuretics other than as outlined above.  3 hypertension-blood pressure controlled.  Continue present medical regimen.  4 palpitations/nonsustained ventricular tachycardia-symptoms are controlled and LV function is normal.  Continue beta-blocker.  5 cirrhosis-she is being considered for liver transplant.  Follow-up [employer].  6 chronic stage IIIa kidney disease-most recent creatinine 1.4.  Hold diuretics prior to catheterization as outlined above.  Check potassium, renal function 4 days after procedure.  Kirk Ruths, MD

## 2021-11-18 NOTE — Patient Instructions (Addendum)
  [place] [place] [address] Dept: [phone] Loc: [place]  11/18/2021  You are scheduled for a Cardiac Catheterization on Thursday, December 1 with Dr. Lauree Chandler.  1. Please arrive at the [facility] (Main Entrance A) at [facility]: [address] [place], [place] [address] at 11:30 AM (This time is two hours before your procedure to ensure your preparation). Free valet parking service is available.   Special note: Every effort is made to have your procedure done on time. Please understand that emergencies sometimes delay scheduled procedures.  2. Diet: Do not eat solid foods after midnight.  The patient may have clear liquids until 5am upon the day of the procedure.  TAKE ASPIRIN 81 MG THE MORNING OF THE PROCEDURE  DO NOT TAKE SPIRONOLACTONE, FUROSEMIDE OR POTASSIUM 11/30 OR 12/1, RESTART 12/2  On the morning of your procedure, take your Aspirin and any morning medicines NOT listed above.  You may use sips of water.  5. Plan for one night stay--bring personal belongings. 6. Bring a current list of your medications and current insurance cards. 7. You MUST have a responsible person to drive you home. 8. Someone MUST be with you the first 24 hours after you arrive home or your discharge will be delayed. 9. Please wear clothes that are easy to get on and off and wear slip-on shoes.  Thank you for allowing [place] to care for you!   -- [facility]   RETURN FOR LAB WORK Monday 11-24-21   Your physician recommends that you schedule a follow-up appointment in: [place]

## 2021-11-19 ENCOUNTER — Telehealth: Payer: Self-pay | Admitting: *Deleted

## 2021-11-19 NOTE — Telephone Encounter (Signed)
Cardiac catheterization scheduled at [facility] for: Thursday November 20, 2021 1:30 PM [facility] Main Entrance A [facility]) at: 8:30 AM-pre-procedure hydration per protocol-GFR 42   No solid food after midnight prior to cath, clear liquids until 5 AM day of procedure  Hold medications: Spironolactone/Lasix/KCl-day before and day of cath-GFR 42  Except hold medications usual morning medications can be taken pre-cath with sips of water including aspirin 81 mg.    Confirmed patient has responsible adult to drive home post procedure and be with patient first 24 hours after arriving home.  [facility] does allow one visitor to accompany you and wait in the hospital waiting room while you are there for your procedure. You and your visitor will be asked to wear a mask once you enter the hospital.   Patient reports does not currently have any new symptoms concerning for COVID-19 and no household members with COVID-19 like illness.     Reviewed procedure/mask/visitor instructions, pre-procedure hydration with patient.

## 2021-11-20 ENCOUNTER — Encounter: Payer: Medicare Other | Admitting: Physical Therapy

## 2021-11-20 ENCOUNTER — Encounter (HOSPITAL_COMMUNITY)
Admission: RE | Disposition: A | Discharge status: Home / Self Care | Payer: Self-pay | Source: Home / Self Care | Attending: Cardiovascular Disease

## 2021-11-20 ENCOUNTER — Encounter (HOSPITAL_COMMUNITY): Payer: Self-pay | Admitting: Cardiovascular Disease

## 2021-11-20 ENCOUNTER — Ambulatory Visit (HOSPITAL_COMMUNITY)
Admission: RE | Admit: 2021-11-20 | Discharge: 2021-11-20 | Disposition: A | Discharge status: Home / Self Care | Payer: Medicare Other | Attending: Cardiovascular Disease | Admitting: Cardiovascular Disease

## 2021-11-20 DIAGNOSIS — N1831 Chronic kidney disease, stage 3a: Secondary | ICD-10-CM | POA: Diagnosis not present

## 2021-11-20 DIAGNOSIS — I129 Hypertensive chronic kidney disease with stage 1 through stage 4 chronic kidney disease, or unspecified chronic kidney disease: Secondary | ICD-10-CM | POA: Diagnosis not present

## 2021-11-20 DIAGNOSIS — I472 Ventricular tachycardia, unspecified: Secondary | ICD-10-CM | POA: Diagnosis not present

## 2021-11-20 DIAGNOSIS — I10 Essential (primary) hypertension: Secondary | ICD-10-CM

## 2021-11-20 DIAGNOSIS — I2510 Atherosclerotic heart disease of native coronary artery without angina pectoris: Secondary | ICD-10-CM | POA: Diagnosis present

## 2021-11-20 DIAGNOSIS — R002 Palpitations: Secondary | ICD-10-CM

## 2021-11-20 DIAGNOSIS — E8770 Fluid overload, unspecified: Secondary | ICD-10-CM

## 2021-11-20 DIAGNOSIS — K7460 Unspecified cirrhosis of liver: Secondary | ICD-10-CM | POA: Diagnosis not present

## 2021-11-20 HISTORY — PX: LEFT HEART CATH AND CORONARY ANGIOGRAPHY: CATH118249

## 2021-11-20 SURGERY — LEFT HEART CATH AND CORONARY ANGIOGRAPHY
Anesthesia: LOCAL

## 2021-11-20 MED ORDER — MIDAZOLAM HCL 2 MG/2ML IJ SOLN
INTRAMUSCULAR | Status: AC
  Filled 2021-11-20: qty 2

## 2021-11-20 MED ORDER — SODIUM CHLORIDE 0.9% FLUSH: 3.0000 mL | INTRAVENOUS | Status: DC | PRN

## 2021-11-20 MED ORDER — FENTANYL CITRATE (PF) 100 MCG/2ML IJ SOLN
INTRAMUSCULAR | Status: AC
  Filled 2021-11-20: qty 2

## 2021-11-20 MED ORDER — ACETAMINOPHEN 325 MG PO TABS: 650.0000 mg | ORAL_TABLET | ORAL | Status: DC | PRN

## 2021-11-20 MED ORDER — ASPIRIN 81 MG PO CHEW: 81.0000 mg | CHEWABLE_TABLET | ORAL | Status: DC

## 2021-11-20 MED ORDER — HEPARIN SODIUM (PORCINE) 1000 UNIT/ML IJ SOLN
INTRAMUSCULAR | Status: DC | PRN
  Administered 2021-11-20: 4000 [IU] via INTRAVENOUS

## 2021-11-20 MED ORDER — SODIUM CHLORIDE 0.9 % IV SOLN: 250.0000 mL | INTRAVENOUS | Status: DC | PRN

## 2021-11-20 MED ORDER — SODIUM CHLORIDE 0.9 % WEIGHT BASED INFUSION
3.0000 mL/kg/h | INTRAVENOUS | Status: AC
  Administered 2021-11-20: 3 mL/kg/h via INTRAVENOUS

## 2021-11-20 MED ORDER — HEPARIN SODIUM (PORCINE) 1000 UNIT/ML IJ SOLN
INTRAMUSCULAR | Status: AC
  Filled 2021-11-20: qty 10

## 2021-11-20 MED ORDER — VERAPAMIL HCL 2.5 MG/ML IV SOLN
INTRAVENOUS | Status: DC | PRN
  Administered 2021-11-20: 10 mL via INTRA_ARTERIAL

## 2021-11-20 MED ORDER — SODIUM CHLORIDE 0.9 % IV SOLN: INTRAVENOUS | Status: DC

## 2021-11-20 MED ORDER — MIDAZOLAM HCL 2 MG/2ML IJ SOLN
INTRAMUSCULAR | Status: DC | PRN
  Administered 2021-11-20: 1 mg via INTRAVENOUS

## 2021-11-20 MED ORDER — ONDANSETRON HCL 4 MG/2ML IJ SOLN: 4.0000 mg | Freq: Four times a day (QID) | INTRAMUSCULAR | Status: DC | PRN

## 2021-11-20 MED ORDER — LIDOCAINE HCL (PF) 1 % IJ SOLN
INTRAMUSCULAR | Status: DC | PRN
  Administered 2021-11-20: 5 mL

## 2021-11-20 MED ORDER — VERAPAMIL HCL 2.5 MG/ML IV SOLN
INTRAVENOUS | Status: AC
  Filled 2021-11-20: qty 2

## 2021-11-20 MED ORDER — SODIUM CHLORIDE 0.9 % WEIGHT BASED INFUSION: 1.0000 mL/kg/h | INTRAVENOUS | Status: DC

## 2021-11-20 MED ORDER — IOHEXOL 350 MG/ML SOLN
INTRAVENOUS | Status: DC | PRN
  Administered 2021-11-20: 45 mL via INTRACARDIAC

## 2021-11-20 MED ORDER — LABETALOL HCL 5 MG/ML IV SOLN: 10.0000 mg | INTRAVENOUS | Status: DC | PRN

## 2021-11-20 MED ORDER — SODIUM CHLORIDE 0.9% FLUSH: 3.0000 mL | Freq: Two times a day (BID) | INTRAVENOUS | Status: DC

## 2021-11-20 MED ORDER — HEPARIN (PORCINE) IN NACL 1000-0.9 UT/500ML-% IV SOLN
INTRAVENOUS | Status: DC | PRN
  Administered 2021-11-20 (×2): 500 mL

## 2021-11-20 MED ORDER — FENTANYL CITRATE (PF) 100 MCG/2ML IJ SOLN
INTRAMUSCULAR | Status: DC | PRN
  Administered 2021-11-20: 25 ug via INTRAVENOUS

## 2021-11-20 MED ORDER — HEPARIN (PORCINE) IN NACL 1000-0.9 UT/500ML-% IV SOLN
INTRAVENOUS | Status: AC
  Filled 2021-11-20: qty 1000

## 2021-11-20 MED ORDER — HYDRALAZINE HCL 20 MG/ML IJ SOLN: 10.0000 mg | INTRAMUSCULAR | Status: DC | PRN

## 2021-11-20 MED ORDER — LIDOCAINE HCL (PF) 1 % IJ SOLN
INTRAMUSCULAR | Status: AC
  Filled 2021-11-20: qty 30

## 2021-11-20 SURGICAL SUPPLY — 10 items
CATH 5FR JL3.5 JR4 ANG PIG MP (CATHETERS) ×1 IMPLANT
CATH VISTA GUIDE 6FR XBLAD3.5 (CATHETERS) ×1 IMPLANT
DEVICE RAD COMP TR BAND LRG (VASCULAR PRODUCTS) ×1 IMPLANT
GLIDESHEATH SLEND SS 6F .021 (SHEATH) ×1 IMPLANT
GUIDEWIRE INQWIRE 1.5J.035X260 (WIRE) ×1 IMPLANT
INQWIRE 1.5J .035X260CM (WIRE) ×2
KIT HEART LEFT (KITS) ×2 IMPLANT
PACK CARDIAC CATHETERIZATION (CUSTOM PROCEDURE TRAY) ×2 IMPLANT
TRANSDUCER W/STOPCOCK (MISCELLANEOUS) ×2 IMPLANT
TUBING CIL FLEX 10 FLL-RA (TUBING) ×2 IMPLANT

## 2021-11-20 NOTE — Interval H&P Note (Signed)
History and Physical Interval Note:  11/20/2021 12:05 PM  Dawn Moon  has presented today for surgery, with the diagnosis of cad.  The various methods of treatment have been discussed with the patient and family. After consideration of risks, benefits and other options for treatment, the patient has consented to  Procedure(s): LEFT HEART CATH AND CORONARY ANGIOGRAPHY (N/A) as a surgical intervention.  The patient's history has been reviewed, patient examined, no change in status, stable for surgery.  I have reviewed the patient's chart and labs.  Questions were answered to the patient's satisfaction.    Cath Lab Visit (complete for each Cath Lab visit)  Clinical Evaluation Leading to the Procedure:   ACS: No.  Non-ACS:    Anginal Classification: No Symptoms  Anti-ischemic medical therapy: Minimal Therapy (1 class of medications)  Non-Invasive Test Results: High-risk stress test findings: cardiac mortality >3%/year (Coronary CTA at [facility] with severe LAD stenosis)  Prior CABG: No previous CABG        Lauree Chandler

## 2021-11-24 LAB — BASIC METABOLIC PANEL
BUN/Creatinine Ratio: 14 (ref 12–28)
BUN: 20 mg/dL (ref 8–27)
CO2: 22 mmol/L (ref 20–29)
Calcium: 9.4 mg/dL (ref 8.7–10.3)
Chloride: 99 mmol/L (ref 96–106)
Creatinine, Ser: 1.47 mg/dL — ABNORMAL HIGH (ref 0.57–1.00)
Glucose: 98 mg/dL (ref 70–99)
Potassium: 4.1 mmol/L (ref 3.5–5.2)
Sodium: 134 mmol/L (ref 134–144)
eGFR: 39 mL/min/{1.73_m2} — ABNORMAL LOW (ref >59)

## 2021-11-25 ENCOUNTER — Encounter (HOSPITAL_COMMUNITY)
Admission: RE | Disposition: A | Discharge status: Home / Self Care | Payer: Self-pay | Source: Home / Self Care | Attending: Internal Medicine

## 2021-11-25 ENCOUNTER — Ambulatory Visit (HOSPITAL_COMMUNITY): Payer: Medicare Other | Admitting: Registered Nurse

## 2021-11-25 ENCOUNTER — Ambulatory Visit (HOSPITAL_COMMUNITY)
Admission: RE | Admit: 2021-11-25 | Discharge: 2021-11-25 | Disposition: A | Discharge status: Home / Self Care | Payer: Medicare Other | Attending: Internal Medicine | Admitting: Internal Medicine

## 2021-11-25 ENCOUNTER — Other Ambulatory Visit: Payer: Self-pay

## 2021-11-25 ENCOUNTER — Encounter (HOSPITAL_COMMUNITY): Payer: Self-pay | Admitting: Internal Medicine

## 2021-11-25 DIAGNOSIS — K7581 Nonalcoholic steatohepatitis (NASH): Secondary | ICD-10-CM | POA: Diagnosis not present

## 2021-11-25 DIAGNOSIS — I8510 Secondary esophageal varices without bleeding: Secondary | ICD-10-CM

## 2021-11-25 DIAGNOSIS — Z01818 Encounter for other preprocedural examination: Secondary | ICD-10-CM

## 2021-11-25 DIAGNOSIS — I10 Essential (primary) hypertension: Secondary | ICD-10-CM | POA: Insufficient documentation

## 2021-11-25 DIAGNOSIS — K6289 Other specified diseases of anus and rectum: Secondary | ICD-10-CM

## 2021-11-25 DIAGNOSIS — I868 Varicose veins of other specified sites: Secondary | ICD-10-CM | POA: Diagnosis not present

## 2021-11-25 DIAGNOSIS — K648 Other hemorrhoids: Secondary | ICD-10-CM | POA: Diagnosis not present

## 2021-11-25 DIAGNOSIS — K5730 Diverticulosis of large intestine without perforation or abscess without bleeding: Secondary | ICD-10-CM | POA: Insufficient documentation

## 2021-11-25 DIAGNOSIS — Z1211 Encounter for screening for malignant neoplasm of colon: Secondary | ICD-10-CM | POA: Diagnosis present

## 2021-11-25 DIAGNOSIS — K633 Ulcer of intestine: Secondary | ICD-10-CM | POA: Insufficient documentation

## 2021-11-25 DIAGNOSIS — K219 Gastro-esophageal reflux disease without esophagitis: Secondary | ICD-10-CM | POA: Insufficient documentation

## 2021-11-25 DIAGNOSIS — K7460 Unspecified cirrhosis of liver: Secondary | ICD-10-CM | POA: Diagnosis not present

## 2021-11-25 DIAGNOSIS — K639 Disease of intestine, unspecified: Secondary | ICD-10-CM

## 2021-11-25 HISTORY — PX: COLONOSCOPY WITH PROPOFOL: SHX5780

## 2021-11-25 HISTORY — PX: BIOPSY: SHX5522

## 2021-11-25 SURGERY — COLONOSCOPY WITH PROPOFOL
Anesthesia: Monitor Anesthesia Care

## 2021-11-25 MED ORDER — LACTATED RINGERS IV SOLN
INTRAVENOUS | Status: AC | PRN
  Administered 2021-11-25: 1000 mL via INTRAVENOUS

## 2021-11-25 MED ORDER — PROPOFOL 500 MG/50ML IV EMUL
INTRAVENOUS | Status: DC | PRN
  Administered 2021-11-25 (×2): 20 mg via INTRAVENOUS
  Administered 2021-11-25: 10 mg via INTRAVENOUS

## 2021-11-25 MED ORDER — PROPOFOL 1000 MG/100ML IV EMUL
INTRAVENOUS | Status: AC
  Filled 2021-11-25: qty 100

## 2021-11-25 MED ORDER — SODIUM CHLORIDE 0.9 % IV SOLN: INTRAVENOUS | Status: DC

## 2021-11-25 MED ORDER — PROPOFOL 500 MG/50ML IV EMUL
INTRAVENOUS | Status: DC | PRN
  Administered 2021-11-25: 130 ug/kg/min via INTRAVENOUS

## 2021-11-25 SURGICAL SUPPLY — 25 items

## 2021-11-25 NOTE — H&P (Signed)
[facility] History and Physical   Primary Care Physician:  [name], NP   Reason for Procedure:   Screening colonoscopy  Plan:    colonoscopy     HPI: Dawn Moon is a 65 y.o. female here for screening colonoscopy as she is undergoing evaluation for liver transplant.  I had originally planned for an EGD also due to hx of varices but she is on nadolol prophylaxis so that is not necessary and will be cancelled.  She is stable overall - using Lomotil prn for IBS-D  About to get Xifaxan from [place] to treat HE  Had heart cath with a single stenosis after abnormal stress echo - for medical management   Past Medical History:  Diagnosis Date   Ascites    Dysrhythmia    hx of SVT   Encephalopathy, hepatic 05/13/2018   GERD (gastroesophageal reflux disease)    History of exercise stress test    03-07-2010  Stress echo--- no arrhythmias or conduction abnormalilites and negative for ischemia or chest pain   History of kidney stones    History of paroxysmal supraventricular tachycardia    episode 2011  consult w/ dr Caryl Comes --  put on atenolol--  per pt no longer an issue   HTN (hypertension)    cardiologist --  dr Stanford Breed   IBS (irritable bowel syndrome)    diarrhea   Nonalcoholic steatohepatitis (NASH)    Numbness and tingling of left lower extremity    post achilles tendon repair   OA (osteoarthritis)    knees   Pleural effusion on right    hepatic hydrothorax   PMB (postmenopausal bleeding) none recent   thickened endometrium   PONV (postoperative nausea and vomiting)    Recurrent UTI none since 2019   Secondary esophageal varices without bleeding ([place]) 06/03/2018   Vitamin D deficiency    Wears glasses     Past Surgical History:  Procedure Laterality Date   ACHILLES TENDON SURGERY Left 06/2016   BREAST BIOPSY Left 02/2016   benign   COLONOSCOPY  2005   CT CTA CORONARY W/CA SCORE W/CM &/OR WO/CM  01/01/2014   non-obstructive  calcified plaque in pLAD (0-25%), no significant incidental noncardiac findings noted   DILATATION & CURETTAGE/HYSTEROSCOPY WITH MYOSURE N/A 12/02/2020   Procedure: DILATATION & CURETTAGE/HYSTEROSCOPY WITH POLYPECTOMY AND REMOVAL OF VAGINAL LESION;  Surgeon: [name], MD;  Location: [facility];  Service: Gynecology;  Laterality: N/A;  request to follow in [place] held (DR. Delilah Shan has two other cases that morning starting 7:30am)   ESOPHAGOGASTRODUODENOSCOPY  01/2014   EXTRACORPOREAL SHOCK WAVE LITHOTRIPSY  2010   KNEE ARTHROPLASTY Right 03/24/2018   Procedure: RIGHT TOTAL KNEE ARTHROPLASTY WITH COMPUTER NAVIGATION;  Surgeon: Rod Can, MD;  Location: [facility];  Service: Orthopedics;  Laterality: Right;  Needs RNFA   KNEE ARTHROSCOPY Right 12/04/2016   Procedure: ARTHROSCOPY KNEE WITH PARTIAL MEDIAL MENISCECTOMY;  Surgeon: Rod Can, MD;  Location: [facility];  Service: Orthopedics;  Laterality: Right;   LEFT HEART CATH AND CORONARY ANGIOGRAPHY N/A 11/20/2021   Procedure: LEFT HEART CATH AND CORONARY ANGIOGRAPHY;  Surgeon: Burnell Blanks, MD;  Location: [facility];  Service: Cardiovascular;  Laterality: N/A;   LEFT HEART CATHETERIZATION WITH CORONARY ANGIOGRAM N/A 01/11/2012   Procedure: LEFT HEART CATHETERIZATION WITH CORONARY ANGIOGRAM;  Surgeon: Sherren Mocha, MD;  Location: [facility];  Service: Cardiovascular;  Laterality: N/A;  widely patent coronary arterires without significant obstructive  CAD,  normal LVF, ef 55-56%   TONSILLECTOMY AND ADENOIDECTOMY  child   TRANSTHORACIC ECHOCARDIOGRAM  01/18/2012   mild LVH,  ef 60%/  trivial TR   TUBAL LIGATION  1985   UPPER GASTROINTESTINAL ENDOSCOPY  last done 2019    Prior to Admission medications   Medication Sig Start Date End Date Taking? Authorizing Provider  diphenoxylate-atropine (LOMOTIL) 2.5-0.025 MG tablet TAKE  (1) OR (2)  TABLETS EVERY SIX HOURS AS NEEDED  03/07/21  Yes Gatha Mayer, MD  furosemide (LASIX) 40 MG tablet TAKE 1 TABLET DAILY Patient taking differently: 40 mg. 12/01/19  Yes Leamon Arnt, MD  lactulose (CHRONULAC) 10 GM/15ML solution Take 10 g by mouth 2 (two) times a week.   Yes [provider]  Lidocaine 4 % PTCH Apply 1 application topically daily as needed (pain).   Yes [provider]  Multiple Vitamins-Minerals (CENTRUM MINIS WOMEN 50+ PO) Take 1 tablet by mouth daily. 10/28/21  Yes [provider]  nadolol (CORGARD) 20 MG tablet Take 1 tablet (20 mg total) by mouth daily. 10/08/21  Yes Gatha Mayer, MD  omeprazole (PRILOSEC) 20 MG capsule Take 20 mg by mouth daily. 09/20/21  Yes [provider]  OVER THE COUNTER MEDICATION Take 1 tablet by mouth daily as needed (Nausea). altoids mints   Yes [provider]  potassium chloride SA (KLOR-CON) 20 MEQ tablet TAKE 1 TABLET ONCE DAILY 06/25/21  Yes Crenshaw, Denice Bors, MD  promethazine (PHENERGAN) 25 MG tablet Take 1 tablet (25 mg total) by mouth every 6 (six) hours as needed for nausea or vomiting. 09/16/18  Yes Leamon Arnt, MD  spironolactone (ALDACTONE) 100 MG tablet TAKE 1 TABLET DAILY 12/01/19  Yes Leamon Arnt, MD  PEG-KCl-NaCl-NaSulf-Na Asc-C (PLENVU) 140 g SOLR Take 1 kit by mouth as directed. Patient not taking: Reported on 11/18/2021 10/08/21   Gatha Mayer, MD  rifaximin (XIFAXAN) 550 MG TABS tablet Take 550 mg by mouth 2 (two) times daily. 10/29/21 10/29/22  [provider]    Current Facility-Administered Medications  Medication Dose Route Frequency Provider Last Rate Last Admin   0.9 %  sodium chloride infusion   Intravenous Continuous Gatha Mayer, MD       lactated ringers infusion    Continuous PRN Gatha Mayer, MD 125 mL/hr at 11/25/21 1059 Continued from Pre-op at 11/25/21 1059    Allergies as of 10/08/2021   (No Known Allergies)    Family History  Problem Relation Age of Onset   Lymphoma  Mother        non-hodgkins - died @ 58   Coronary artery disease Mother    Coronary artery disease Father        CABG in his 71s, died @ 78   Diabetes Father    Healthy Brother    Hypertension Sister    Arthritis Sister    IgA nephropathy Daughter    Kidney failure Daughter    Celiac disease Daughter    Breast cancer Maternal Aunt    Colon cancer Neg Hx    Esophageal cancer Neg Hx    Stomach cancer Neg Hx    Rectal cancer Neg Hx    Ovarian cancer Neg Hx    Cervical cancer Neg Hx     Social History   Socioeconomic History   Marital status: Married    Spouse name: Not on file   Number of children: Not on file   Years of education:  Not on file   Highest education level: Not on file  Occupational History   Occupation: inpatient case manager    Employer: [employer]  Tobacco Use   Smoking status: Never   Smokeless tobacco: Never  Vaping Use   Vaping Use: Never used  Substance and Sexual Activity   Alcohol use: Not Currently   Drug use: No   Sexual activity: Yes    Birth control/protection: Post-menopausal    Comment: 1st intercourse 65 yo-Fewer than 5 partners  Other Topics Concern   Not on file  Social History Narrative   Married, 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  Has to walk up stairs to work and can do w/o Ss.  Lives @ home in [place] with family.   Social Determinants of Health   Financial Resource Strain: Not on file  Food Insecurity: Not on file  Transportation Needs: Not on file  Physical Activity: Not on file  Stress: Not on file  Social Connections: Not on file  Intimate Partner Violence: Not on file    Review of Systems:  All other review of systems negative except as mentioned in the HPI.  Physical Exam: Vital signs BP 128/75   Pulse 91   Temp 98.3 F (36.8 C) (Oral)   Resp 17   Ht 5' 3.5" (1.613 m)   Wt  72.1 kg   SpO2 100%   BMI 27.72 kg/m   General:   Alert,  Well-developed, well-nourished, pleasant and cooperative in NAD Lungs:  Clear throughout to auscultation.   Heart:  Regular rate and rhythm; no murmurs, clicks, rubs,  or gallops. Abdomen:  Soft, nontender and nondistended. Normal bowel sounds.   Neuro/Psych:  Alert and cooperative. Normal mood and affect. A and O x 3   @Sonni Barse  Simonne Maffucci, MD, [facility] [phone] (pager) 11/25/2021 11:08 AM@

## 2021-11-25 NOTE — Op Note (Signed)
[facility] Patient Name: Dawn Moon Procedure Date: 11/25/2021 MRN: [id number] Attending MD: Gatha Mayer , MD Date of Birth: 1956/08/04 CSN: [id number] Age: 65 Admit Type: Inpatient Procedure:                Colonoscopy Indications:              Screening for colorectal malignant neoplasm Providers:                Gatha Mayer, MD, Jaci Carrel, RN, Benetta Spar, Technician Referring MD:              Medicines:                Propofol per Anesthesia, Monitored Anesthesia Care Complications:            No immediate complications. Estimated Blood Loss:     Estimated blood loss was minimal. Procedure:                Pre-Anesthesia Assessment:                           - Prior to the procedure, a History and Physical                            was performed, and patient medications and                            allergies were reviewed. The patient's tolerance of                            previous anesthesia was also reviewed. The risks                            and benefits of the procedure and the sedation                            options and risks were discussed with the patient.                            All questions were answered, and informed consent                            was obtained. Prior Anticoagulants: The patient has                            taken no previous anticoagulant or antiplatelet                            agents. ASA Grade Assessment: III - A patient with                            severe systemic disease. After reviewing the risks  and benefits, the patient was deemed in                            satisfactory condition to undergo the procedure.                           After obtaining informed consent, the colonoscope                            was passed under direct vision. Throughout the                            procedure, the patient's blood pressure, pulse,  and                            oxygen saturations were monitored continuously. The                            CF-HQ190L ([id number]) Olympus colonoscope was                            introduced through the anus and advanced to the the                            terminal ileum, with identification of the                            appendiceal orifice and IC valve. The colonoscopy                            was performed without difficulty. The patient                            tolerated the procedure well. The quality of the                            bowel preparation was good. The terminal ileum,                            ileocecal valve, appendiceal orifice, and rectum                            were photographed. The bowel preparation used was                            Plenvu via split dose instruction. Scope In: Scope Out: Findings:      The perianal and digital rectal examinations were normal.      A diffuse area of mucosa in the terminal ileum was mildly atrophic and       vascular-pattern-decreased. Biopsies were taken with a cold forceps for       histology. Verification of patient identification for the specimen was       done. Estimated blood loss was minimal.      A diffuse area of mildly vascular-pattern-decreased mucosa  was found in       the entire colon. Biopsies were taken with a cold forceps for histology.       Verification of patient identification for the specimen was done.       Estimated blood loss was minimal.      Small rectal varices were found.      Scattered small-mouthed diverticula were found in the sigmoid colon and       descending colon.      The exam was otherwise without abnormality on direct and retroflexion       views. Impression:               - Atrophic and vascular-pattern-decreased mucosa in                            the terminal ileum. Biopsied. She has a dx of IBS-D                            ? other                           -  Vascular-pattern-decreased mucosa in the entire                            examined colon. Biopsied. IBS-D vs other?                           - Rectal varices.                           - Diverticulosis in the sigmoid colon and in the                            descending colon.                           - The examination was otherwise normal on direct                            and retroflexion views. Moderate Sedation:      Not Applicable - Patient had care per Anesthesia. Recommendation:           - Patient has a contact number available for                            emergencies. The signs and symptoms of potential                            delayed complications were discussed with the                            patient. Return to normal activities tomorrow.                            Written discharge instructions were provided to the  patient.                           - Resume previous diet.                           - Continue present medications.                           - Await pathology results.                           - No recommendation at this time regarding repeat                            colonoscopy due to age.                           - EGD not done - had scheduled but she takes                            nadolol prophylaxis so not necessary                           - once path results in will cc [place] Procedure Code(s):        --- Professional ---                           [phone], Colonoscopy, flexible; with biopsy, single                            or multiple Diagnosis Code(s):        --- Professional ---                           Z12.11, Encounter for screening for malignant                            neoplasm of colon                           K63.89, Other specified diseases of intestine                           K64.8, Other hemorrhoids                           K57.30, Diverticulosis of large intestine without                             perforation or abscess without bleeding CPT copyright 2019 American Medical Association. All rights reserved. The codes documented in this report are preliminary and upon coder review may  be revised to meet current compliance requirements. Gatha Mayer, MD 11/25/2021 11:43:59 AM This report has been signed electronically. Number of Addenda: 0

## 2021-11-25 NOTE — Anesthesia Preprocedure Evaluation (Signed)
Anesthesia Evaluation  Patient identified by MRN, date of birth, ID band Patient awake    Reviewed: Allergy & Precautions, H&P , NPO status , Patient's Chart, lab work & pertinent test results  Airway Mallampati: III  TM Distance: <3 FB Neck ROM: Full    Dental no notable dental hx.    Pulmonary neg pulmonary ROS,    Pulmonary exam normal breath sounds clear to auscultation       Cardiovascular hypertension, Normal cardiovascular exam+ dysrhythmias Supra Ventricular Tachycardia  Rhythm:Regular Rate:Normal     Neuro/Psych negative neurological ROS  negative psych ROS   GI/Hepatic negative GI ROS, (+) Cirrhosis   Esophageal Varices    , NASH   Endo/Other  negative endocrine ROS  Renal/GU negative Renal ROS  negative genitourinary   Musculoskeletal negative musculoskeletal ROS (+)   Abdominal   Peds negative pediatric ROS (+)  Hematology negative hematology ROS (+)   Anesthesia Other Findings   Reproductive/Obstetrics negative OB ROS                             Anesthesia Physical Anesthesia Plan  ASA: 3  Anesthesia Plan: MAC   Post-op Pain Management:    Induction: Intravenous  PONV Risk Score and Plan: 2 and Propofol infusion and Treatment may vary due to age or medical condition  Airway Management Planned: Simple Face Mask  Additional Equipment:   Intra-op Plan:   Post-operative Plan:   Informed Consent: I have reviewed the patients History and Physical, chart, labs and discussed the procedure including the risks, benefits and alternatives for the proposed anesthesia with the patient or authorized representative who has indicated his/her understanding and acceptance.     Dental advisory given  Plan Discussed with: CRNA and Surgeon  Anesthesia Plan Comments:         Anesthesia Quick Evaluation

## 2021-11-25 NOTE — Discharge Instructions (Signed)
I did not find any polyps or cancer.  The lining of the small intestine and colon looked mildly abnormal - reduced blood vessels seen. That may be due to changes of cirrhosis and portal hypertension but since you still struggle with diarrhea I took biopsies to double check that some other problem is not being overlooked. Years ago you had colon biopsies that were ok but I thought a recheck was appropriate.  You do have scattered diverticulosis.  I also saw some rectal varices - another place there can be increased blood flow due to cirrhosis and portal hypertension - so the hemorrhoids are essentially enlarged and swollen like the esophageal veins.  I will let you know what the biopsies show [place].  As we discussed, since you take nadolol to reduce risk of bleeding from the esophageal varices we did not need to do the EGD  I appreciate the opportunity to care for you. Gatha Mayer, MD, FACG  YOU HAD AN ENDOSCOPIC PROCEDURE TODAY: Refer to the procedure report and other information in the discharge instructions given to you for any specific questions about what was found during the examination. If this information does not answer your questions, please call Dr. Celesta Aver office at [phone] to clarify.   YOU SHOULD EXPECT: Some feelings of bloating in the abdomen. Passage of more gas than usual. Walking can help get rid of the air that was put into your GI tract during the procedure and reduce the bloating. If you had a lower endoscopy (such as a colonoscopy or flexible sigmoidoscopy) you may notice spotting of blood in your stool or on the toilet paper. Some abdominal soreness may be present for a day or two, also.  DIET: Your first meal following the procedure should be a light meal and then it is ok to progress to your normal diet. A half-sandwich or bowl of soup is an example of a good first meal. Heavy or fried foods are harder to digest and may make you feel nauseous or bloated. Drink  plenty of fluids but you should avoid alcoholic beverages for 24 hours.   ACTIVITY: Your care partner should take you home directly after the procedure. You should plan to take it easy, moving slowly for the rest of the day. You can resume normal activity the day after the procedure however YOU SHOULD NOT DRIVE, use power tools, machinery or perform tasks that involve climbing or major physical exertion for 24 hours (because of the sedation medicines used during the test).   SYMPTOMS TO REPORT IMMEDIATELY: A gastroenterologist can be reached at any hour. Please call [phone]  for any of the following symptoms:  Following lower endoscopy (colonoscopy, flexible sigmoidoscopy) Excessive amounts of blood in the stool  Significant tenderness, worsening of abdominal pains  Swelling of the abdomen that is new, acute  Fever of 100 or higher  Following upper endoscopy (EGD, EUS, ERCP, esophageal dilation) Vomiting of blood or coffee ground material  New, significant abdominal pain  New, significant chest pain or pain under the shoulder blades  Painful or persistently difficult swallowing  New shortness of breath  Black, tarry-looking or red, bloody stools  FOLLOW UP:  If any biopsies were taken you will be contacted by phone or by letter within the next 1-3 weeks. Call [phone]  if you have not heard about the biopsies in 3 weeks.  Please also call with any specific questions about appointments or follow up tests.

## 2021-11-25 NOTE — Transfer of Care (Signed)
Immediate Anesthesia Transfer of Care Note  Patient: Dawn Moon  Procedure(s) Performed: COLONOSCOPY WITH PROPOFOL BIOPSY  Patient Location: PACU and Endoscopy Unit  Anesthesia Type:MAC  Level of Consciousness: awake, alert , oriented and patient cooperative  Airway & Oxygen Therapy: Patient Spontanous Breathing and Patient connected to face mask oxygen  Post-op Assessment: Report given to RN, Post -op Vital signs reviewed and stable and Patient moving all extremities  Post vital signs: Reviewed and stable  Last Vitals:  Vitals Value Taken Time  BP 103/49 11/25/21 1140  Temp    Pulse 86 11/25/21 1141  Resp 14 11/25/21 1141  SpO2 100 % 11/25/21 1141  Vitals shown include unvalidated device data.  Last Pain:  Vitals:   11/25/21 0957  TempSrc: Oral  PainSc: 0-No pain         Complications: No notable events documented.

## 2021-11-25 NOTE — Anesthesia Postprocedure Evaluation (Signed)
Anesthesia Post Note  Patient: Dawn Moon  Procedure(s) Performed: COLONOSCOPY WITH PROPOFOL BIOPSY     Patient location during evaluation: PACU Anesthesia Type: MAC Level of consciousness: awake and alert Pain management: pain level controlled Vital Signs Assessment: post-procedure vital signs reviewed and stable Respiratory status: spontaneous breathing, nonlabored ventilation, respiratory function stable and patient connected to nasal cannula oxygen Cardiovascular status: stable and blood pressure returned to baseline Postop Assessment: no apparent nausea or vomiting Anesthetic complications: no   No notable events documented.  Last Vitals:  Vitals:   11/25/21 1150 11/25/21 1200  BP: (!) 102/51 (!) 106/49  Pulse: 86 87  Resp: 20 12  Temp:    SpO2: 100% 98%    Last Pain:  Vitals:   11/25/21 1200  TempSrc:   PainSc: 0-No pain                 Keyarah Mcroy S

## 2021-11-26 ENCOUNTER — Encounter (HOSPITAL_COMMUNITY): Payer: Self-pay | Admitting: Internal Medicine

## 2021-11-26 LAB — SURGICAL PATHOLOGY

## 2021-11-28 ENCOUNTER — Other Ambulatory Visit: Payer: Self-pay

## 2021-11-28 ENCOUNTER — Ambulatory Visit: Payer: Medicare Other | Attending: Unknown Physician Specialty | Admitting: Physical Therapy

## 2021-11-28 DIAGNOSIS — M6281 Muscle weakness (generalized): Secondary | ICD-10-CM | POA: Diagnosis not present

## 2021-11-28 NOTE — Therapy (Signed)
[facility] [place], [place], [address] Phone: [phone]   Fax:  [phone]  Physical Therapy Treatment  Patient Details  Name: Dawn Moon MRN: [id number] Date of Birth: 09/18/1956 Referring Provider (PT): Barbarann Ehlers Date: 11/28/2021   PT End of Session - 11/28/21 1225     Visit Number 4    Number of Visits 12    Date for PT Re-Evaluation 01/09/22    PT Start Time 1115    PT Stop Time 1200    PT Time Calculation (min) 45 min             Past Medical History:  Diagnosis Date   Ascites    Dysrhythmia    hx of SVT   Encephalopathy, hepatic 05/13/2018   GERD (gastroesophageal reflux disease)    History of exercise stress test    03-07-2010  Stress echo--- no arrhythmias or conduction abnormalilites and negative for ischemia or chest pain   History of kidney stones    History of paroxysmal supraventricular tachycardia    episode 2011  consult w/ dr Caryl Comes --  put on atenolol--  per pt no longer an issue   HTN (hypertension)    cardiologist --  dr Stanford Breed   IBS (irritable bowel syndrome)    diarrhea   Nonalcoholic steatohepatitis (NASH)    Numbness and tingling of left lower extremity    post achilles tendon repair   OA (osteoarthritis)    knees   Pleural effusion on right    hepatic hydrothorax   PMB (postmenopausal bleeding) none recent   thickened endometrium   PONV (postoperative nausea and vomiting)    Recurrent UTI none since 2019   Secondary esophageal varices without bleeding ([place]) 06/03/2018   Vitamin D deficiency    Wears glasses     Past Surgical History:  Procedure Laterality Date   ACHILLES TENDON SURGERY Left 06/2016   BIOPSY  11/25/2021   Procedure: BIOPSY;  Surgeon: Gatha Mayer, MD;  Location: [facility];  Service: Endoscopy;;   BREAST BIOPSY Left 02/2016   benign   COLONOSCOPY  2005   COLONOSCOPY WITH PROPOFOL N/A 11/25/2021   Procedure: COLONOSCOPY WITH  PROPOFOL;  Surgeon: Gatha Mayer, MD;  Location: [facility];  Service: Endoscopy;  Laterality: N/A;   CT CTA CORONARY W/CA SCORE W/CM &/OR WO/CM  01/01/2014   non-obstructive calcified plaque in pLAD (0-25%), no significant incidental noncardiac findings noted   DILATATION & CURETTAGE/HYSTEROSCOPY WITH MYOSURE N/A 12/02/2020   Procedure: DILATATION & CURETTAGE/HYSTEROSCOPY WITH POLYPECTOMY AND REMOVAL OF VAGINAL LESION;  Surgeon: [name], MD;  Location: [facility];  Service: Gynecology;  Laterality: N/A;  request to follow in [place] held (DR. Delilah Shan has two other cases that morning starting 7:30am)   ESOPHAGOGASTRODUODENOSCOPY  01/2014   EXTRACORPOREAL SHOCK WAVE LITHOTRIPSY  2010   KNEE ARTHROPLASTY Right 03/24/2018   Procedure: RIGHT TOTAL KNEE ARTHROPLASTY WITH COMPUTER NAVIGATION;  Surgeon: Rod Can, MD;  Location: [facility];  Service: Orthopedics;  Laterality: Right;  Needs RNFA   KNEE ARTHROSCOPY Right 12/04/2016   Procedure: ARTHROSCOPY KNEE WITH PARTIAL MEDIAL MENISCECTOMY;  Surgeon: Rod Can, MD;  Location: [facility];  Service: Orthopedics;  Laterality: Right;   LEFT HEART CATH AND CORONARY ANGIOGRAPHY N/A 11/20/2021   Procedure: LEFT HEART CATH AND CORONARY ANGIOGRAPHY;  Surgeon: Burnell Blanks, MD;  Location: [facility];  Service: Cardiovascular;  Laterality: N/A;   LEFT  HEART CATHETERIZATION WITH CORONARY ANGIOGRAM N/A 01/11/2012   Procedure: LEFT HEART CATHETERIZATION WITH CORONARY ANGIOGRAM;  Surgeon: Sherren Mocha, MD;  Location: [facility];  Service: Cardiovascular;  Laterality: N/A;  widely patent coronary arterires without significant obstructive CAD,  normal LVF, ef 55-56%   TONSILLECTOMY AND ADENOIDECTOMY  child   TRANSTHORACIC ECHOCARDIOGRAM  01/18/2012   mild LVH,  ef 60%/  trivial TR   TUBAL LIGATION  1985   UPPER GASTROINTESTINAL ENDOSCOPY  last done 2019    There were no vitals filed  for this visit.   Subjective Assessment - 11/28/21 1220     Subjective COVID-19 screen performed prior to patient entering clinic.  No new complaints.    Pertinent History IBS, chronic low back pain, right TKA, HTN, and liver cerrhosis secondary to NASH    Limitations Walking    How long can you stand comfortably? 10-15 minutes    How long can you walk comfortably? 10-15 minutes    Patient Stated Goals get stronger, walk longer, return to work    Pain Onset More than a month ago                               [facility] Adult PT Treatment/Exercise - 11/28/21 0001       Exercises   Exercises Knee/Hip;Shoulder      Knee/Hip Exercises: Aerobic   Recumbent Bike Level 2 x 5 minutes.    Nustep 15 minutes on level 3.      Knee/Hip Exercises: Machines for Strengthening   Cybex Knee Extension 10# x 3 minutes.    Cybex Knee Flexion 30# x 3 minutes.      Shoulder Exercises: ROM/Strengthening   UBE (Upper Arm Bike) 120 RPM's x 6 minutes.                       PT Short Term Goals - 11/11/21 0910       PT SHORT TERM GOAL #1   Title Patient will be able to complete a five time sit to stand test without being limited by fatigue.    Baseline only able to complete 4 reps    Time 4    Period Weeks    Status On-going    Target Date 12/04/21               PT Long Term Goals - 11/11/21 0910       PT LONG TERM GOAL #1   Title Patient will be independent with her HEP.    Time 6    Period Weeks    Status On-going      PT LONG TERM GOAL #2   Title Patient will be able to stand and walk at least 30 minutes prior to being limited by fatigue.    Baseline 10-15 minutes    Time 6    Period Weeks    Status On-going      PT LONG TERM GOAL #3   Title Patient will be able to complete a five time sit to stand test in 30 seconds or less.    Baseline unable to complete    Time 6    Period Weeks    Status On-going                   Plan -  11/28/21 1231     Clinical Impression Statement Patient did very well today  with the addition of UBE, recumbent bike and resisted quad/ham machine.    Personal Factors and Comorbidities Fitness;Comorbidity 1;Comorbidity 2;Comorbidity 3+    Comorbidities IBS, chronic low back pain, right TKA, HTN, and liver cerrhosis secondary to NASH    Examination-Activity Limitations Locomotion Level;Transfers;Caring for Others;Stand    Examination-Participation Restrictions Occupation;Community Activity;Shop    Stability/Clinical Decision Making Evolving/Moderate complexity    Rehab Potential Good    PT Frequency 2x / week    PT Duration 6 weeks    PT Next Visit Plan cont with nustep and global strengthening, progress per tolerance and issue HEP    Consulted and Agree with Plan of Care Patient             Patient will benefit from skilled therapeutic intervention in order to improve the following deficits and impairments:     Visit Diagnosis: Muscle weakness (generalized)     Problem List Patient Active Problem List   Diagnosis Date Noted   Colon cancer screening    Mucosal abnormality of colon    Coronary artery disease involving native coronary artery of native heart without angina pectoris    Adnexal mass 10/25/2020   Elevated cancer antigen 125 (CA 125) 10/25/2020   Thickened endometrium 10/25/2020   Portal hypertensive gastropathy ([place]) 02/29/2020   Recurrent UTI 06/12/2019   Secondary insomnia 04/10/2019   Anemia 02/28/2019   Thrombocytopenia ([place]) 02/28/2019   Degeneration of lumbar intervertebral disc 08/06/2018   Secondary esophageal varices without bleeding ([place]) 06/03/2018   Encephalopathy, hepatic 05/13/2018   History of paroxysmal supraventricular tachycardia    Liver cirrhosis secondary to NASH ([place]) 04/23/2018   Irritable bowel syndrome-diarrhea predominant 06/15/2017   Gastroesophageal reflux disease 06/15/2017   Hyperlipidemia    Kidney stone    Essential  hypertension 02/13/2010    Mamie Hundertmark, [place], PT 11/28/2021, 12:33 PM  [facility] [address] [place], [place], [address] Phone: [phone]   Fax:  [phone]  Name: Dawn Moon MRN: [id number] Date of Birth: 1956/09/10

## 2021-12-02 ENCOUNTER — Ambulatory Visit: Payer: Medicare Other | Admitting: Physical Therapy

## 2021-12-02 ENCOUNTER — Other Ambulatory Visit: Payer: Self-pay

## 2021-12-02 DIAGNOSIS — M6281 Muscle weakness (generalized): Secondary | ICD-10-CM

## 2021-12-02 NOTE — Therapy (Signed)
[facility] [place], [place], [address] Phone: [phone]   Fax:  [phone]  Physical Therapy Treatment  Patient Details  Name: Dawn Moon MRN: [id number] Date of Birth: 01-Sep-1956 Referring Provider (PT): Vernon Prey   Encounter Date: 12/02/2021   PT End of Session - 12/02/21 0940     Visit Number 5    Number of Visits 12    Date for PT Re-Evaluation 01/09/22    PT Start Time 0945    PT Stop Time 1027    PT Time Calculation (min) 42 min    Activity Tolerance Patient tolerated treatment well    Behavior During Therapy [facility] for tasks assessed/performed             Past Medical History:  Diagnosis Date   Ascites    Dysrhythmia    hx of SVT   Encephalopathy, hepatic 05/13/2018   GERD (gastroesophageal reflux disease)    History of exercise stress test    03-07-2010  Stress echo--- no arrhythmias or conduction abnormalilites and negative for ischemia or chest pain   History of kidney stones    History of paroxysmal supraventricular tachycardia    episode 2011  consult w/ dr Caryl Comes --  put on atenolol--  per pt no longer an issue   HTN (hypertension)    cardiologist --  dr Stanford Breed   IBS (irritable bowel syndrome)    diarrhea   Nonalcoholic steatohepatitis (NASH)    Numbness and tingling of left lower extremity    post achilles tendon repair   OA (osteoarthritis)    knees   Pleural effusion on right    hepatic hydrothorax   PMB (postmenopausal bleeding) none recent   thickened endometrium   PONV (postoperative nausea and vomiting)    Recurrent UTI none since 2019   Secondary esophageal varices without bleeding ([place]) 06/03/2018   Vitamin D deficiency    Wears glasses     Past Surgical History:  Procedure Laterality Date   ACHILLES TENDON SURGERY Left 06/2016   BIOPSY  11/25/2021   Procedure: BIOPSY;  Surgeon: Gatha Mayer, MD;  [facility];  Service: Endoscopy;;   BREAST BIOPSY  Left 02/2016   benign   COLONOSCOPY  2005   COLONOSCOPY WITH PROPOFOL N/A 11/25/2021   Procedure: COLONOSCOPY WITH PROPOFOL;  Surgeon: Gatha Mayer, MD;  [facility];  Service: Endoscopy;  Laterality: N/A;   CT CTA CORONARY W/CA SCORE W/CM &/OR WO/CM  01/01/2014   non-obstructive calcified plaque in pLAD (0-25%), no significant incidental noncardiac findings noted   DILATATION & CURETTAGE/HYSTEROSCOPY WITH MYOSURE N/A 12/02/2020   Procedure: DILATATION & CURETTAGE/HYSTEROSCOPY WITH POLYPECTOMY AND REMOVAL OF VAGINAL LESION;  Surgeon: [name], MD;  Location: [facility];  Service: Gynecology;  Laterality: N/A;  request to follow in [place] held (DR. Delilah Shan has two other cases that morning starting 7:30am)   ESOPHAGOGASTRODUODENOSCOPY  01/2014   EXTRACORPOREAL SHOCK WAVE LITHOTRIPSY  2010   KNEE ARTHROPLASTY Right 03/24/2018   Procedure: RIGHT TOTAL KNEE ARTHROPLASTY WITH COMPUTER NAVIGATION;  Surgeon: Rod Can, MD;  [facility];  Service: Orthopedics;  Laterality: Right;  Needs RNFA   KNEE ARTHROSCOPY Right 12/04/2016   Procedure: ARTHROSCOPY KNEE WITH PARTIAL MEDIAL MENISCECTOMY;  Surgeon: Rod Can, MD;  Location: [place];  Service: Orthopedics;  Laterality: Right;   LEFT HEART CATH AND CORONARY ANGIOGRAPHY N/A 11/20/2021   Procedure: LEFT HEART CATH AND CORONARY ANGIOGRAPHY;  Surgeon:  Burnell Blanks, MD;  Location: [facility];  Service: Cardiovascular;  Laterality: N/A;   LEFT HEART CATHETERIZATION WITH CORONARY ANGIOGRAM N/A 01/11/2012   Procedure: LEFT HEART CATHETERIZATION WITH CORONARY ANGIOGRAM;  Surgeon: Sherren Mocha, MD;  Location: [facility];  Service: Cardiovascular;  Laterality: N/A;  widely patent coronary arterires without significant obstructive CAD,  normal LVF, ef 55-56%   TONSILLECTOMY AND ADENOIDECTOMY  child   TRANSTHORACIC ECHOCARDIOGRAM  01/18/2012   mild LVH,  ef 60%/   trivial TR   TUBAL LIGATION  1985   UPPER GASTROINTESTINAL ENDOSCOPY  last done 2019    There were no vitals filed for this visit.   Subjective Assessment - 12/02/21 0940     Subjective COVID-19 screen performed prior to patient entering clinic.Pt arrived and reported she is having a good day. pt reported last tx went very well.    Pertinent History IBS, chronic low back pain, right TKA, HTN, and liver cerrhosis secondary to NASH    Limitations Walking    How long can you stand comfortably? 10-15 minutes    How long can you walk comfortably? 10-15 minutes    Patient Stated Goals get stronger, walk longer, return to work    Currently in Pain? No/denies                                         PT Education - 12/02/21 0959     Education Details HEP for strength progression UE/LE    Person(s) Educated Patient    Methods Explanation;Demonstration;Handout    Comprehension Verbalized understanding;Returned demonstration              PT Short Term Goals - 12/02/21 0941       PT SHORT TERM GOAL #1   Title Patient will be able to complete a five time sit to stand test without being limited by fatigue.    Baseline 9 reps today 12/02/21    Time 4    Period Weeks    Status Achieved    Target Date 12/04/21               PT Long Term Goals - 12/02/21 0942       PT LONG TERM GOAL #1   Title Patient will be independent with her HEP.    Baseline HEP provided today 12/02/21    Time 6    Period Weeks    Status On-going    Target Date 12/18/21      PT LONG TERM GOAL #2   Title Patient will be able to stand and walk at least 30 minutes prior to being limited by fatigue.    Baseline 30-45 minutes reported 12/02/21    Time 6    Period Weeks    Status Achieved      PT LONG TERM GOAL #3   Title Patient will be able to complete a five time sit to stand test in 30 seconds or less.    Baseline able to complete more than 5 in 30 seconds 12/02/21     Time 6    Period Weeks    Status Achieved    Target Date 12/18/21      PT LONG TERM GOAL #4   Title Patient will be able to complete a five time sit to stand test in 12 seconds or less to reduce her fall risk.  Time 3    Period Weeks    Status New    Target Date 12/24/21                   Plan - 12/02/21 1019     Clinical Impression Statement Pt arrived with no pain complaints and doing very well. pt able to walk and stand 30-45 min shopping per reported. pt feels overall improvement from exercises. pt improved sit to stand up to 9 reps and x5 reps in 30 seconds. pt does get fatigue after exercises and requires rest break. Issued HEP for home progression to improve UE & LE strength. Met STG #1 & LTG #2 & #3 today.    Personal Factors and Comorbidities Fitness;Comorbidity 1;Comorbidity 2;Comorbidity 3+    Comorbidities IBS, chronic low back pain, right TKA, HTN, and liver cerrhosis secondary to NASH    Examination-Activity Limitations Locomotion Level;Transfers;Caring for Others;Stand    Examination-Participation Restrictions Occupation;Community Activity;Shop    Stability/Clinical Decision Making Evolving/Moderate complexity    Rehab Potential Good    PT Frequency 2x / week    PT Duration 6 weeks    PT Treatment/Interventions ADLs/Self Care Home Management;Electrical Stimulation;Gait training;Stair training;Functional mobility training;Therapeutic activities;Therapeutic exercise;Balance training;Neuromuscular re-education;Patient/family education;Energy conservation    PT Next Visit Plan cont with nustep and global strengthening, progress per tolerance (sent to PT for goal progression)    Consulted and Agree with Plan of Care Patient             Patient will benefit from skilled therapeutic intervention in order to improve the following deficits and impairments:  Difficulty walking, Decreased endurance, Decreased activity tolerance, Decreased balance, Decreased  mobility, Decreased strength  Visit Diagnosis: Muscle weakness (generalized)     Problem List Patient Active Problem List   Diagnosis Date Noted   Colon cancer screening    Mucosal abnormality of colon    Coronary artery disease involving native coronary artery of native heart without angina pectoris    Adnexal mass 10/25/2020   Elevated cancer antigen 125 (CA 125) 10/25/2020   Thickened endometrium 10/25/2020   Portal hypertensive gastropathy ([place]) 02/29/2020   Recurrent UTI 06/12/2019   Secondary insomnia 04/10/2019   Anemia 02/28/2019   Thrombocytopenia ([place]) 02/28/2019   Degeneration of lumbar intervertebral disc 08/06/2018   Secondary esophageal varices without bleeding ([place]) 06/03/2018   Encephalopathy, hepatic 05/13/2018   History of paroxysmal supraventricular tachycardia    Liver cirrhosis secondary to NASH ([place]) 04/23/2018   Irritable bowel syndrome-diarrhea predominant 06/15/2017   Gastroesophageal reflux disease 06/15/2017   Hyperlipidemia    Kidney stone    Essential hypertension 02/13/2010    Darlin Coco, PT 12/03/2021, 1:58 PM  [facility] [address] [place], [place], [address] Phone: [phone]   Fax:  [phone]  Name: Dawn Moon MRN: [id number] Date of Birth: 1956-07-28

## 2021-12-02 NOTE — Patient Instructions (Signed)
° °  Shoulder Flexion  Start with arms at sides and feet hip width apart. Lift arms forward and up to shoulder height then return.  Can perform seated and keep thumbs up. 10x with 5 second holds 1 x day  SLOWLY MOVE SHOULDER IN BOTH UP AND DOWN    Shoulder Scaption  Begin with your arms by your side. Slowly raise your arms forward and to the side, as if you are making a 'V'. Raise to shoulder level, pause, and then slowly lower to starting position. Can perform seated and keep thumbs up. 10x with 5 second holds 1 x day SLOWLY MOVE SHOULDER IN BOTH UP AND DOWN    WHEELCHAIR - SHOULDER ABDUCTION - FREE WEIGHT - BILATERAL While seated in a wheelchair, hold a free weight / dumbbell in both hands with your elbows straight. Lift your arms up to the side, then lower back down and repeat. Can perform seated and keep thumbs up. 10x with 5 second holds 1 x day SLOWLY MOVE SHOULDER IN BOTH UP AND DOWN    SEATED MARCHING - HIP FLEXION  While sitting in a chair, lift your foot off the ground as you flex your hip and lift your leg.  Lower back down and repeat on the opposite leg. Repeat this alternating movement.  10x with 5 second holds 1 x day SLOWLY MOVE LEGS IN BOTH UP AND DOWN   ALL EXERCISES NO WEIGHT

## 2021-12-04 ENCOUNTER — Encounter: Payer: Self-pay | Admitting: Physical Therapy

## 2021-12-04 ENCOUNTER — Ambulatory Visit: Payer: Medicare Other | Admitting: Physical Therapy

## 2021-12-04 ENCOUNTER — Other Ambulatory Visit: Payer: Self-pay

## 2021-12-04 DIAGNOSIS — M6281 Muscle weakness (generalized): Secondary | ICD-10-CM

## 2021-12-04 NOTE — Therapy (Signed)
[facility] [place], [place], [address] Phone: [phone]   Fax:  [phone]  Physical Therapy Treatment  Patient Details  Name: Dawn Moon MRN: [id number] Date of Birth: May 01, 1956 Referring Provider (PT): Vernon Prey   Encounter Date: 12/04/2021   PT End of Session - 12/04/21 1045     Visit Number 6    Number of Visits 12    Date for PT Re-Evaluation 01/09/22    PT Start Time 1032    PT Stop Time 1114    PT Time Calculation (min) 42 min    Activity Tolerance Patient tolerated treatment well    Behavior During Therapy [facility] for tasks assessed/performed             Past Medical History:  Diagnosis Date   Ascites    Dysrhythmia    hx of SVT   Encephalopathy, hepatic 05/13/2018   GERD (gastroesophageal reflux disease)    History of exercise stress test    03-07-2010  Stress echo--- no arrhythmias or conduction abnormalilites and negative for ischemia or chest pain   History of kidney stones    History of paroxysmal supraventricular tachycardia    episode 2011  consult w/ dr Caryl Comes --  put on atenolol--  per pt no longer an issue   HTN (hypertension)    cardiologist --  dr Stanford Breed   IBS (irritable bowel syndrome)    diarrhea   Nonalcoholic steatohepatitis (NASH)    Numbness and tingling of left lower extremity    post achilles tendon repair   OA (osteoarthritis)    knees   Pleural effusion on right    hepatic hydrothorax   PMB (postmenopausal bleeding) none recent   thickened endometrium   PONV (postoperative nausea and vomiting)    Recurrent UTI none since 2019   Secondary esophageal varices without bleeding ([facility]) 06/03/2018   Vitamin D deficiency    Wears glasses     Past Surgical History:  Procedure Laterality Date   ACHILLES TENDON SURGERY Left 06/2016   BIOPSY  11/25/2021   Procedure: BIOPSY;  Surgeon: Gatha Mayer, MD;  Location: [facility];  Service: Endoscopy;;   BREAST BIOPSY  Left 02/2016   benign   COLONOSCOPY  2005   COLONOSCOPY WITH PROPOFOL N/A 11/25/2021   Procedure: COLONOSCOPY WITH PROPOFOL;  Surgeon: Gatha Mayer, MD;  [facility]: [facility];  Service: Endoscopy;  Laterality: N/A;   CT CTA CORONARY W/CA SCORE W/CM &/OR WO/CM  01/01/2014   non-obstructive calcified plaque in pLAD (0-25%), no significant incidental noncardiac findings noted   DILATATION & CURETTAGE/HYSTEROSCOPY WITH MYOSURE N/A 12/02/2020   Procedure: DILATATION & CURETTAGE/HYSTEROSCOPY WITH POLYPECTOMY AND REMOVAL OF VAGINAL LESION;  Surgeon: [name], MD;  Location: [place];  Service: Gynecology;  Laterality: N/A;  request to follow in [place] held (DR. Delilah Shan has two other cases that morning starting 7:30am)   ESOPHAGOGASTRODUODENOSCOPY  01/2014   EXTRACORPOREAL SHOCK WAVE LITHOTRIPSY  2010   KNEE ARTHROPLASTY Right 03/24/2018   Procedure: RIGHT TOTAL KNEE ARTHROPLASTY WITH COMPUTER NAVIGATION;  Surgeon: Rod Can, MD;  [facility];  Service: Orthopedics;  Laterality: Right;  Needs RNFA   KNEE ARTHROSCOPY Right 12/04/2016   Procedure: ARTHROSCOPY KNEE WITH PARTIAL MEDIAL MENISCECTOMY;  Surgeon: Rod Can, MD;  Location: [place];  Service: Orthopedics;  Laterality: Right;   LEFT HEART CATH AND CORONARY ANGIOGRAPHY N/A 11/20/2021   Procedure: LEFT HEART CATH AND CORONARY ANGIOGRAPHY;  Surgeon:  Burnell Blanks, MD;  Location: [place] [facility];  Service: Cardiovascular;  Laterality: N/A;   LEFT HEART CATHETERIZATION WITH CORONARY ANGIOGRAM N/A 01/11/2012   Procedure: LEFT HEART CATHETERIZATION WITH CORONARY ANGIOGRAM;  Surgeon: Sherren Mocha, MD;  Location: [facility] C[facility];  Service: Cardiovascular;  Laterality: N/A;  widely patent coronary arterires without significant obstructive CAD,  normal LVF, ef 55-56%   TONSILLECTOMY AND ADENOIDECTOMY  child   TRANSTHORACIC ECHOCARDIOGRAM  01/18/2012   mild LVH,  ef 60%/   trivial TR   TUBAL LIGATION  1985   UPPER GASTROINTESTINAL ENDOSCOPY  last done 2019    There were no vitals filed for this visit.   Subjective Assessment - 12/04/21 1025     Subjective COVID-19 screen performed prior to patient entering clinic. Feels tired upon arrival but drove herself to [facility] yesterday.    Pertinent History IBS, chronic low back pain, right TKA, HTN, and liver cerrhosis secondary to NASH    Limitations Walking    How long can you stand comfortably? 10-15 minutes    How long can you walk comfortably? 10-15 minutes    Patient Stated Goals get stronger, walk longer, return to work    Currently in Pain? No/denies                [facility] PT Assessment - 12/04/21 0001       Assessment   Medical Diagnosis Liver cirrhosis secondary to NASH    Referring Provider (PT) Vernon Prey    Onset Date/Surgical Date 09/30/21    Next MD Visit 01/2022    Prior Therapy Yes      Precautions   Precautions None      Restrictions   Weight Bearing Restrictions No                           [facility] Adult PT Treatment/Exercise - 12/04/21 0001       Knee/Hip Exercises: Aerobic   Nustep L4 x15 min      Knee/Hip Exercises: Machines for Strengthening   Cybex Knee Extension 10# x 2 minutes.    Cybex Knee Flexion 40# x 2 minutes.      Knee/Hip Exercises: Seated   Ball Squeeze 2x10 5 second hold    Clamshell with TheraBand Yellow   x20 reps   Knee/Hip Flexion BLE marching yellow theraband x10 reps    Other Seated Knee/Hip Exercises B toe raise x20 reps                       PT Short Term Goals - 12/02/21 0941       PT SHORT TERM GOAL #1   Title Patient will be able to complete a five time sit to stand test without being limited by fatigue.    Baseline 9 reps today 12/02/21    Time 4    Period Weeks    Status Achieved    Target Date 12/04/21               PT Long Term Goals - 12/02/21 0942       PT LONG TERM GOAL #1   Title  Patient will be independent with her HEP.    Baseline HEP provided today 12/02/21    Time 6    Period Weeks    Status On-going    Target Date 12/18/21      PT LONG TERM GOAL #2   Title Patient will  be able to stand and walk at least 30 minutes prior to being limited by fatigue.    Baseline 30-45 minutes reported 12/02/21    Time 6    Period Weeks    Status Achieved      PT LONG TERM GOAL #3   Title Patient will be able to complete a five time sit to stand test in 30 seconds or less.    Baseline able to complete more than 5 in 30 seconds 12/02/21    Time 6    Period Weeks    Status Achieved    Target Date 12/18/21      PT LONG TERM GOAL #4   Title Patient will be able to complete a five time sit to stand test in 12 seconds or less to reduce her fall risk.    Time 3    Period Weeks    Status New    Target Date 12/24/21                   Plan - 12/04/21 1306     Clinical Impression Statement Patient presented in clinic with reports of fatigue. Patient encouraged at slow pace with rest breaks in which she needed fairly regularly. Patient able to complete therex fairly well other than generalized fatigue. Reported feeling more stable when she did not have her AD close.    Personal Factors and Comorbidities Fitness;Comorbidity 1;Comorbidity 2;Comorbidity 3+    Comorbidities IBS, chronic low back pain, right TKA, HTN, and liver cerrhosis secondary to NASH    Examination-Activity Limitations Locomotion Level;Transfers;Caring for Others;Stand    Examination-Participation Restrictions Occupation;Community Activity;Shop    Stability/Clinical Decision Making Evolving/Moderate complexity    Rehab Potential Good    PT Frequency 2x / week    PT Duration 6 weeks    PT Treatment/Interventions ADLs/Self Care Home Management;Electrical Stimulation;Gait training;Stair training;Functional mobility training;Therapeutic activities;Therapeutic exercise;Balance training;Neuromuscular  re-education;Patient/family education;Energy conservation    PT Next Visit Plan cont with nustep and global strengthening, progress per tolerance (sent to PT for goal progression)    Consulted and Agree with Plan of Care Patient             Patient will benefit from skilled therapeutic intervention in order to improve the following deficits and impairments:  Difficulty walking, Decreased endurance, Decreased activity tolerance, Decreased balance, Decreased mobility, Decreased strength  Visit Diagnosis: Muscle weakness (generalized)     Problem List Patient Active Problem List   Diagnosis Date Noted   Colon cancer screening    Mucosal abnormality of colon    Coronary artery disease involving native coronary artery of native heart without angina pectoris    Adnexal mass 10/25/2020   Elevated cancer antigen 125 (CA 125) 10/25/2020   Thickened endometrium 10/25/2020   Portal hypertensive gastropathy ([place]) 02/29/2020   Recurrent UTI 06/12/2019   Secondary insomnia 04/10/2019   Anemia 02/28/2019   Thrombocytopenia ([place]) 02/28/2019   Degeneration of lumbar intervertebral disc 08/06/2018   Secondary esophageal varices without bleeding ([place]) 06/03/2018   Encephalopathy, hepatic 05/13/2018   History of paroxysmal supraventricular tachycardia    Liver cirrhosis secondary to NASH ([place]) 04/23/2018   Irritable bowel syndrome-diarrhea predominant 06/15/2017   Gastroesophageal reflux disease 06/15/2017   Hyperlipidemia    Kidney stone    Essential hypertension 02/13/2010    Standley Brooking, PTA 12/04/2021, 1:11 PM  [facility] [address] [place], [place], [address] Phone: [phone]   Fax:  [phone]  Name: Dawn  Makynlee Moon MRN: [id number] Date of Birth: [birth year]

## 2021-12-09 ENCOUNTER — Ambulatory Visit: Payer: Medicare Other | Admitting: Physical Therapy

## 2021-12-09 ENCOUNTER — Encounter: Payer: Self-pay | Admitting: Physical Therapy

## 2021-12-09 ENCOUNTER — Other Ambulatory Visit: Payer: Self-pay

## 2021-12-09 DIAGNOSIS — M6281 Muscle weakness (generalized): Secondary | ICD-10-CM | POA: Diagnosis not present

## 2021-12-09 NOTE — Therapy (Signed)
[facility] [place], [place], [address] Phone: [phone]   Fax:  [phone]  Physical Therapy Treatment  Patient Details  Name: Dawn Moon MRN: [id number] Date of Birth: 14-Jul-1956 Referring Provider (PT): Vernon Prey   Encounter Date: 12/09/2021   PT End of Session - 12/09/21 1005     Visit Number 7    Number of Visits 12    Date for PT Re-Evaluation 01/09/22    PT Start Time 0946    PT Stop Time 1028    PT Time Calculation (min) 42 min    Activity Tolerance Patient limited by fatigue    Behavior During Therapy [facility] for tasks assessed/performed             Past Medical History:  Diagnosis Date   Ascites    Dysrhythmia    hx of SVT   Encephalopathy, hepatic 05/13/2018   GERD (gastroesophageal reflux disease)    History of exercise stress test    03-07-2010  Stress echo--- no arrhythmias or conduction abnormalilites and negative for ischemia or chest pain   History of kidney stones    History of paroxysmal supraventricular tachycardia    episode 2011  consult w/ dr Caryl Comes --  put on atenolol--  per pt no longer an issue   HTN (hypertension)    cardiologist --  dr Stanford Breed   IBS (irritable bowel syndrome)    diarrhea   Nonalcoholic steatohepatitis (NASH)    Numbness and tingling of left lower extremity    post achilles tendon repair   OA (osteoarthritis)    knees   Pleural effusion on right    hepatic hydrothorax   PMB (postmenopausal bleeding) none recent   thickened endometrium   PONV (postoperative nausea and vomiting)    Recurrent UTI none since 2019   Secondary esophageal varices without bleeding ([place]) 06/03/2018   Vitamin D deficiency    Wears glasses     Past Surgical History:  Procedure Laterality Date   ACHILLES TENDON SURGERY Left 06/2016   BIOPSY  11/25/2021   Procedure: BIOPSY;  Surgeon: Gatha Mayer, MD;  [facility];  Service: Endoscopy;;   BREAST BIOPSY Left  02/2016   benign   COLONOSCOPY  2005   COLONOSCOPY WITH PROPOFOL N/A 11/25/2021   Procedure: COLONOSCOPY WITH PROPOFOL;  Surgeon: Gatha Mayer, MD;  [facility];  Service: Endoscopy;  Laterality: N/A;   CT CTA CORONARY W/CA SCORE W/CM &/OR WO/CM  01/01/2014   non-obstructive calcified plaque in pLAD (0-25%), no significant incidental noncardiac findings noted   DILATATION & CURETTAGE/HYSTEROSCOPY WITH MYOSURE N/A 12/02/2020   Procedure: DILATATION & CURETTAGE/HYSTEROSCOPY WITH POLYPECTOMY AND REMOVAL OF VAGINAL LESION;  Surgeon: [name], MD;  Location: [place];  Service: Gynecology;  Laterality: N/A;  request to follow in [place] held (DR. Delilah Shan has two other cases that morning starting 7:30am)   ESOPHAGOGASTRODUODENOSCOPY  01/2014   EXTRACORPOREAL SHOCK WAVE LITHOTRIPSY  2010   KNEE ARTHROPLASTY Right 03/24/2018   Procedure: RIGHT TOTAL KNEE ARTHROPLASTY WITH COMPUTER NAVIGATION;  Surgeon: Rod Can, MD;  [facility];  Service: Orthopedics;  Laterality: Right;  Needs RNFA   KNEE ARTHROSCOPY Right 12/04/2016   Procedure: ARTHROSCOPY KNEE WITH PARTIAL MEDIAL MENISCECTOMY;  Surgeon: Rod Can, MD;  Location: [place];  Service: Orthopedics;  Laterality: Right;   LEFT HEART CATH AND CORONARY ANGIOGRAPHY N/A 11/20/2021   Procedure: LEFT HEART CATH AND CORONARY ANGIOGRAPHY;  Surgeon:  Burnell Blanks, MD;  Location: [place] [facility];  Service: Cardiovascular;  Laterality: N/A;   LEFT HEART CATHETERIZATION WITH CORONARY ANGIOGRAM N/A 01/11/2012   Procedure: LEFT HEART CATHETERIZATION WITH CORONARY ANGIOGRAM;  Surgeon: Sherren Mocha, MD;  Location: [facility] C[facility];  Service: Cardiovascular;  Laterality: N/A;  widely patent coronary arterires without significant obstructive CAD,  normal LVF, ef 55-56%   TONSILLECTOMY AND ADENOIDECTOMY  child   TRANSTHORACIC ECHOCARDIOGRAM  01/18/2012   mild LVH,  ef 60%/   trivial TR   TUBAL LIGATION  1985   UPPER GASTROINTESTINAL ENDOSCOPY  last done 2019    There were no vitals filed for this visit.   Subjective Assessment - 12/09/21 0954     Subjective COVID-19 screen performed prior to patient entering clinic. Reporting back pain and feeling very nauseous yesterda.    Pertinent History IBS, chronic low back pain, right TKA, HTN, and liver cerrhosis secondary to NASH    Limitations Walking    How long can you stand comfortably? 10-15 minutes    How long can you walk comfortably? 10-15 minutes    Patient Stated Goals get stronger, walk longer, return to work    Currently in Pain? Yes    Pain Location Back    Pain Orientation Lower    Pain Descriptors / Indicators Discomfort    Pain Type Chronic pain    Pain Onset More than a month ago    Pain Frequency Intermittent                [facility] PT Assessment - 12/09/21 0001       Assessment   Medical Diagnosis Liver cirrhosis secondary to NASH    Referring Provider (PT) Vernon Prey    Onset Date/Surgical Date 09/30/21    Next MD Visit 01/2022    Prior Therapy Yes      Precautions   Precautions None      Restrictions   Weight Bearing Restrictions No                           [facility] Adult PT Treatment/Exercise - 12/09/21 0001       Knee/Hip Exercises: Aerobic   Nustep L3 x15 min      Knee/Hip Exercises: Machines for Strengthening   Cybex Knee Extension 10# x 2 minutes.    Cybex Knee Flexion 20# x 2 minutes.      Knee/Hip Exercises: Seated   Ball Squeeze 2x10 5 second hold    Clamshell with TheraBand Yellow   x30 reps   Other Seated Knee/Hip Exercises B heel/toe raise x30 reps      Modalities   Modalities --                       PT Short Term Goals - 12/02/21 0941       PT SHORT TERM GOAL #1   Title Patient will be able to complete a five time sit to stand test without being limited by fatigue.    Baseline 9 reps today 12/02/21    Time 4    Period  Weeks    Status Achieved    Target Date 12/04/21               PT Long Term Goals - 12/02/21 0942       PT LONG TERM GOAL #1   Title Patient will be independent with her HEP.    Baseline  HEP provided today 12/02/21    Time 6    Period Weeks    Status On-going    Target Date 12/18/21      PT LONG TERM GOAL #2   Title Patient will be able to stand and walk at least 30 minutes prior to being limited by fatigue.    Baseline 30-45 minutes reported 12/02/21    Time 6    Period Weeks    Status Achieved      PT LONG TERM GOAL #3   Title Patient will be able to complete a five time sit to stand test in 30 seconds or less.    Baseline able to complete more than 5 in 30 seconds 12/02/21    Time 6    Period Weeks    Status Achieved    Target Date 12/18/21      PT LONG TERM GOAL #4   Title Patient will be able to complete a five time sit to stand test in 12 seconds or less to reduce her fall risk.    Time 3    Period Weeks    Status New    Target Date 12/24/21                   Plan - 12/09/21 1116     Clinical Impression Statement Patient presented in clinic with increased fatigue and LBP as well as RUQ pain. Patient required rest breaks intermittantly and was advised to not push herself and take her time. Patient able to tolerate light strengthening fairly well. Worried in regards to contact with [facility] regarding more labs.    Personal Factors and Comorbidities Fitness;Comorbidity 1;Comorbidity 2;Comorbidity 3+    Comorbidities IBS, chronic low back pain, right TKA, HTN, and liver cerrhosis secondary to NASH    Examination-Activity Limitations Locomotion Level;Transfers;Caring for Others;Stand    Examination-Participation Restrictions Occupation;Community Activity;Shop    Stability/Clinical Decision Making Evolving/Moderate complexity    Rehab Potential Good    PT Frequency 2x / week    PT Duration 6 weeks    PT Treatment/Interventions ADLs/Self Care Home  Management;Electrical Stimulation;Gait training;Stair training;Functional mobility training;Therapeutic activities;Therapeutic exercise;Balance training;Neuromuscular re-education;Patient/family education;Energy conservation    PT Next Visit Plan cont with nustep and global strengthening, progress per tolerance (sent to PT for goal progression)    Consulted and Agree with Plan of Care Patient             Patient will benefit from skilled therapeutic intervention in order to improve the following deficits and impairments:  Difficulty walking, Decreased endurance, Decreased activity tolerance, Decreased balance, Decreased mobility, Decreased strength  Visit Diagnosis: Muscle weakness (generalized)     Problem List Patient Active Problem List   Diagnosis Date Noted   Colon cancer screening    Mucosal abnormality of colon    Coronary artery disease involving native coronary artery of native heart without angina pectoris    Adnexal mass 10/25/2020   Elevated cancer antigen 125 (CA 125) 10/25/2020   Thickened endometrium 10/25/2020   Portal hypertensive gastropathy ([place]) 02/29/2020   Recurrent UTI 06/12/2019   Secondary insomnia 04/10/2019   Anemia 02/28/2019   Thrombocytopenia ([place]) 02/28/2019   Degeneration of lumbar intervertebral disc 08/06/2018   Secondary esophageal varices without bleeding ([place]) 06/03/2018   Encephalopathy, hepatic 05/13/2018   History of paroxysmal supraventricular tachycardia    Liver cirrhosis secondary to NASH ([place]) 04/23/2018   Irritable bowel syndrome-diarrhea predominant 06/15/2017   Gastroesophageal reflux disease 06/15/2017   Hyperlipidemia  Kidney stone    Essential hypertension 02/13/2010    Standley Brooking, PTA 12/09/2021, 11:21 AM  [facility] [address] [place], [place], [address] Phone: [phone]   Fax:  [phone]  Name: Dawn Moon MRN: [id number] Date of Birth:  09-Jun-1956

## 2021-12-11 ENCOUNTER — Ambulatory Visit: Payer: Medicare Other | Admitting: Physical Therapy

## 2021-12-11 ENCOUNTER — Other Ambulatory Visit: Payer: Self-pay

## 2021-12-11 DIAGNOSIS — M6281 Muscle weakness (generalized): Secondary | ICD-10-CM

## 2021-12-11 NOTE — Therapy (Signed)
[facility] [place], [place], [address] Phone: [phone]   Fax:  [phone]  Physical Therapy Treatment  Patient Details  Name: Dawn Moon MRN: [id number] Date of Birth: 15-Mar-1956 Referring Provider (PT): Vernon Prey   Encounter Date: 12/11/2021   PT End of Session - 12/11/21 1300     Visit Number 8    Number of Visits 12    Date for PT Re-Evaluation 01/09/22    PT Start Time 0945    PT Stop Time 1031    PT Time Calculation (min) 46 min    Behavior During Therapy [facility] for tasks assessed/performed             Past Medical History:  Diagnosis Date   Ascites    Dysrhythmia    hx of SVT   Encephalopathy, hepatic 05/13/2018   GERD (gastroesophageal reflux disease)    History of exercise stress test    03-07-2010  Stress echo--- no arrhythmias or conduction abnormalilites and negative for ischemia or chest pain   History of kidney stones    History of paroxysmal supraventricular tachycardia    episode 2011  consult w/ dr Caryl Comes --  put on atenolol--  per pt no longer an issue   HTN (hypertension)    cardiologist --  dr Stanford Breed   IBS (irritable bowel syndrome)    diarrhea   Nonalcoholic steatohepatitis (NASH)    Numbness and tingling of left lower extremity    post achilles tendon repair   OA (osteoarthritis)    knees   Pleural effusion on right    hepatic hydrothorax   PMB (postmenopausal bleeding) none recent   thickened endometrium   PONV (postoperative nausea and vomiting)    Recurrent UTI none since 2019   Secondary esophageal varices without bleeding ([place]) 06/03/2018   Vitamin D deficiency    Wears glasses     Past Surgical History:  Procedure Laterality Date   ACHILLES TENDON SURGERY Left 06/2016   BIOPSY  11/25/2021   Procedure: BIOPSY;  Surgeon: Gatha Mayer, MD;  Location: [facility];  Service: Endoscopy;;   BREAST BIOPSY Left 02/2016   benign   COLONOSCOPY  2005    COLONOSCOPY WITH PROPOFOL N/A 11/25/2021   Procedure: COLONOSCOPY WITH PROPOFOL;  Surgeon: Gatha Mayer, MD;  [facility]: [facility];  Service: Endoscopy;  Laterality: N/A;   CT CTA CORONARY W/CA SCORE W/CM &/OR WO/CM  01/01/2014   non-obstructive calcified plaque in pLAD (0-25%), no significant incidental noncardiac findings noted   DILATATION & CURETTAGE/HYSTEROSCOPY WITH MYOSURE N/A 12/02/2020   Procedure: DILATATION & CURETTAGE/HYSTEROSCOPY WITH POLYPECTOMY AND REMOVAL OF VAGINAL LESION;  Surgeon: [name], MD;  Location: [place] [place];  Service: Gynecology;  Laterality: N/A;  request to follow in [place] held (DR. Delilah Shan has two other cases that morning starting 7:30am)   ESOPHAGOGASTRODUODENOSCOPY  01/2014   EXTRACORPOREAL SHOCK WAVE LITHOTRIPSY  2010   KNEE ARTHROPLASTY Right 03/24/2018   Procedure: RIGHT TOTAL KNEE ARTHROPLASTY WITH COMPUTER NAVIGATION;  Surgeon: Rod Can, MD;  [facility];  Service: Orthopedics;  Laterality: Right;  Needs RNFA   KNEE ARTHROSCOPY Right 12/04/2016   Procedure: ARTHROSCOPY KNEE WITH PARTIAL MEDIAL MENISCECTOMY;  Surgeon: Rod Can, MD;  Location: [place];  Service: Orthopedics;  Laterality: Right;   LEFT HEART CATH AND CORONARY ANGIOGRAPHY N/A 11/20/2021   Procedure: LEFT HEART CATH AND CORONARY ANGIOGRAPHY;  Surgeon: Burnell Blanks, MD;  Location: [place] [facility];  Service: Cardiovascular;  Laterality: N/A;   LEFT HEART CATHETERIZATION WITH CORONARY ANGIOGRAM N/A 01/11/2012   Procedure: LEFT HEART CATHETERIZATION WITH CORONARY ANGIOGRAM;  Surgeon: Sherren Mocha, MD;  Location: [facility] CATH LAB;  Service: Cardiovascular;  Laterality: N/A;  widely patent coronary arterires without significant obstructive CAD,  normal LVF, ef 55-56%   TONSILLECTOMY AND ADENOIDECTOMY  child   TRANSTHORACIC ECHOCARDIOGRAM  01/18/2012   mild LVH,  ef 60%/  trivial TR   TUBAL LIGATION  1985   UPPER  GASTROINTESTINAL ENDOSCOPY  last done 2019    There were no vitals filed for this visit.                      [facility] Adult PT Treatment/Exercise - 12/11/21 0001       Exercises   Exercises Knee/Hip;Lumbar      Lumbar Exercises: Machines for Strengthening   Cybex Lumbar Extension 40# x 4 minutes    Cybex Knee Extension 40# x 4 minutes.      Knee/Hip Exercises: Aerobic   Nustep Level 3 x 15 minutes.      Knee/Hip Exercises: Machines for Strengthening   Cybex Knee Extension 10# x 3 minutes.    Cybex Knee Flexion 30# x 3 minutes    Cybex Leg Press 1 plate x 2 minutes.                       PT Short Term Goals - 12/02/21 0941       PT SHORT TERM GOAL #1   Title Patient will be able to complete a five time sit to stand test without being limited by fatigue.    Baseline 9 reps today 12/02/21    Time 4    Period Weeks    Status Achieved    Target Date 12/04/21               PT Long Term Goals - 12/02/21 0942       PT LONG TERM GOAL #1   Title Patient will be independent with her HEP.    Baseline HEP provided today 12/02/21    Time 6    Period Weeks    Status On-going    Target Date 12/18/21      PT LONG TERM GOAL #2   Title Patient will be able to stand and walk at least 30 minutes prior to being limited by fatigue.    Baseline 30-45 minutes reported 12/02/21    Time 6    Period Weeks    Status Achieved      PT LONG TERM GOAL #3   Title Patient will be able to complete a five time sit to stand test in 30 seconds or less.    Baseline able to complete more than 5 in 30 seconds 12/02/21    Time 6    Period Weeks    Status Achieved    Target Date 12/18/21      PT LONG TERM GOAL #4   Title Patient will be able to complete a five time sit to stand test in 12 seconds or less to reduce her fall risk.    Time 3    Period Weeks    Status New    Target Date 12/24/21                   Plan - 12/11/21 1303     Clinical  Impression Statement Patient did  an excellent job today with the addition of leg press, ab and back extension machine.  She states she is moving better and indicated that she is now able to get out of bed easier.    Personal Factors and Comorbidities Fitness;Comorbidity 1;Comorbidity 2;Comorbidity 3+    Comorbidities IBS, chronic low back pain, right TKA, HTN, and liver cerrhosis secondary to NASH    Examination-Activity Limitations Locomotion Level;Transfers;Caring for Others;Stand    Examination-Participation Restrictions Occupation;Community Activity;Shop    Stability/Clinical Decision Making Evolving/Moderate complexity    Rehab Potential Good    PT Frequency 2x / week    PT Duration 6 weeks    PT Treatment/Interventions ADLs/Self Care Home Management;Electrical Stimulation;Gait training;Stair training;Functional mobility training;Therapeutic activities;Therapeutic exercise;Balance training;Neuromuscular re-education;Patient/family education;Energy conservation    PT Next Visit Plan cont with nustep and global strengthening, progress per tolerance (sent to PT for goal progression)    Consulted and Agree with Plan of Care Patient             Patient will benefit from skilled therapeutic intervention in order to improve the following deficits and impairments:  Difficulty walking, Decreased endurance, Decreased activity tolerance, Decreased balance, Decreased mobility, Decreased strength  Visit Diagnosis: Muscle weakness (generalized)     Problem List Patient Active Problem List   Diagnosis Date Noted   Colon cancer screening    Mucosal abnormality of colon    Coronary artery disease involving native coronary artery of native heart without angina pectoris    Adnexal mass 10/25/2020   Elevated cancer antigen 125 (CA 125) 10/25/2020   Thickened endometrium 10/25/2020   Portal hypertensive gastropathy ([place]) 02/29/2020   Recurrent UTI 06/12/2019   Secondary insomnia 04/10/2019    Anemia 02/28/2019   Thrombocytopenia ([place]) 02/28/2019   Degeneration of lumbar intervertebral disc 08/06/2018   Secondary esophageal varices without bleeding ([place]) 06/03/2018   Encephalopathy, hepatic 05/13/2018   History of paroxysmal supraventricular tachycardia    Liver cirrhosis secondary to NASH ([place]) 04/23/2018   Irritable bowel syndrome-diarrhea predominant 06/15/2017   Gastroesophageal reflux disease 06/15/2017   Hyperlipidemia    Kidney stone    Essential hypertension 02/13/2010    Rondy Krupinski, [place], PT 12/11/2021, 1:06 PM  [facility] [address] [place], [place], [address] Phone: [phone]   Fax:  [phone]  Name: Dawn Moon MRN: [id number] Date of Birth: 04-24-1956

## 2021-12-16 ENCOUNTER — Ambulatory Visit: Payer: Medicare Other

## 2021-12-23 ENCOUNTER — Other Ambulatory Visit: Payer: Self-pay

## 2021-12-23 ENCOUNTER — Ambulatory Visit: Payer: Medicare Other | Attending: Unknown Physician Specialty | Admitting: *Deleted

## 2021-12-23 DIAGNOSIS — M6281 Muscle weakness (generalized): Secondary | ICD-10-CM | POA: Insufficient documentation

## 2021-12-23 NOTE — Therapy (Signed)
[facility] [place], [place], [address] Phone: [phone]   Fax:  [phone]  Physical Therapy Treatment  Patient Details  Name: Dawn Moon MRN: [id number] Date of Birth: April 03, 1956 Referring Provider (PT): Vernon Prey   Encounter Date: 12/23/2021   PT End of Session - 12/23/21 1330     Visit Number 9    Number of Visits 12    Date for PT Re-Evaluation 01/09/22    PT Start Time 1030    PT Stop Time 1110    PT Time Calculation (min) 40 min             Past Medical History:  Diagnosis Date   Ascites    Dysrhythmia    hx of SVT   Encephalopathy, hepatic 05/13/2018   GERD (gastroesophageal reflux disease)    History of exercise stress test    03-07-2010  Stress echo--- no arrhythmias or conduction abnormalilites and negative for ischemia or chest pain   History of kidney stones    History of paroxysmal supraventricular tachycardia    episode 2011  consult w/ dr Caryl Comes --  put on atenolol--  per pt no longer an issue   HTN (hypertension)    cardiologist --  dr Stanford Breed   IBS (irritable bowel syndrome)    diarrhea   Nonalcoholic steatohepatitis (NASH)    Numbness and tingling of left lower extremity    post achilles tendon repair   OA (osteoarthritis)    knees   Pleural effusion on right    hepatic hydrothorax   PMB (postmenopausal bleeding) none recent   thickened endometrium   PONV (postoperative nausea and vomiting)    Recurrent UTI none since 2019   Secondary esophageal varices without bleeding ([place]) 06/03/2018   Vitamin D deficiency    Wears glasses     Past Surgical History:  Procedure Laterality Date   ACHILLES TENDON SURGERY Left 06/2016   BIOPSY  11/25/2021   Procedure: BIOPSY;  Surgeon: Gatha Mayer, MD;  [facility];  Service: Endoscopy;;   BREAST BIOPSY Left 02/2016   benign   COLONOSCOPY  2005   COLONOSCOPY WITH PROPOFOL N/A 11/25/2021   Procedure: COLONOSCOPY WITH  PROPOFOL;  Surgeon: Gatha Mayer, MD;  [facility];  Service: Endoscopy;  Laterality: N/A;   CT CTA CORONARY W/CA SCORE W/CM &/OR WO/CM  01/01/2014   non-obstructive calcified plaque in pLAD (0-25%), no significant incidental noncardiac findings noted   DILATATION & CURETTAGE/HYSTEROSCOPY WITH MYOSURE N/A 12/02/2020   Procedure: DILATATION & CURETTAGE/HYSTEROSCOPY WITH POLYPECTOMY AND REMOVAL OF VAGINAL LESION;  Surgeon: [name], MD;  Location: [facility];  Service: Gynecology;  Laterality: N/A;  request to follow in [place] held (DR. Delilah Shan has two other cases that morning starting 7:30am)   ESOPHAGOGASTRODUODENOSCOPY  01/2014   EXTRACORPOREAL SHOCK WAVE LITHOTRIPSY  2010   KNEE ARTHROPLASTY Right 03/24/2018   Procedure: RIGHT TOTAL KNEE ARTHROPLASTY WITH COMPUTER NAVIGATION;  Surgeon: Rod Can, MD;  [facility];  Service: Orthopedics;  Laterality: Right;  Needs RNFA   KNEE ARTHROSCOPY Right 12/04/2016   Procedure: ARTHROSCOPY KNEE WITH PARTIAL MEDIAL MENISCECTOMY;  Surgeon: Rod Can, MD;  Location: [facility];  Service: Orthopedics;  Laterality: Right;   LEFT HEART CATH AND CORONARY ANGIOGRAPHY N/A 11/20/2021   Procedure: LEFT HEART CATH AND CORONARY ANGIOGRAPHY;  Surgeon: Burnell Blanks, MD;  Location: [place] [facility];  Service: Cardiovascular;  Laterality: N/A;   LEFT  HEART CATHETERIZATION WITH CORONARY ANGIOGRAM N/A 01/11/2012   Procedure: LEFT HEART CATHETERIZATION WITH CORONARY ANGIOGRAM;  Surgeon: Sherren Mocha, MD;  Location: [facility] CATH LAB;  Service: Cardiovascular;  Laterality: N/A;  widely patent coronary arterires without significant obstructive CAD,  normal LVF, ef 55-56%   TONSILLECTOMY AND ADENOIDECTOMY  child   TRANSTHORACIC ECHOCARDIOGRAM  01/18/2012   mild LVH,  ef 60%/  trivial TR   TUBAL LIGATION  1985   UPPER GASTROINTESTINAL ENDOSCOPY  last done 2019    There were no vitals filed  for this visit.   Subjective Assessment - 12/23/21 1047     Subjective COVID-19 screen performed prior to patient entering clinic. Reporting back pain and feeling very nauseous still from the medicine    Pertinent History IBS, chronic low back pain, right TKA, HTN, and liver cerrhosis secondary to NASH    Limitations Walking    How long can you stand comfortably? 10-15 minutes    How long can you walk comfortably? 10-15 minutes    Patient Stated Goals get stronger, walk longer, return to work    Currently in Pain? Yes    Pain Score 5     Pain Location Back    Pain Orientation Lower    Pain Descriptors / Indicators Discomfort    Pain Type Chronic pain                               [facility] Adult PT Treatment/Exercise - 12/23/21 0001       Exercises   Exercises Knee/Hip;Lumbar      Knee/Hip Exercises: Aerobic   Nustep L4 x15 min      Knee/Hip Exercises: Machines for Strengthening   Cybex Knee Extension 10# x 3 minutes.    Cybex Knee Flexion 30# x 3 minutes      Knee/Hip Exercises: Standing   Rocker Board 3 minutes                       PT Short Term Goals - 12/02/21 0941       PT SHORT TERM GOAL #1   Title Patient will be able to complete a five time sit to stand test without being limited by fatigue.    Baseline 9 reps today 12/02/21    Time 4    Period Weeks    Status Achieved    Target Date 12/04/21               PT Long Term Goals - 12/02/21 0942       PT LONG TERM GOAL #1   Title Patient will be independent with her HEP.    Baseline HEP provided today 12/02/21    Time 6    Period Weeks    Status On-going    Target Date 12/18/21      PT LONG TERM GOAL #2   Title Patient will be able to stand and walk at least 30 minutes prior to being limited by fatigue.    Baseline 30-45 minutes reported 12/02/21    Time 6    Period Weeks    Status Achieved      PT LONG TERM GOAL #3   Title Patient will be able to complete a  five time sit to stand test in 30 seconds or less.    Baseline able to complete more than 5 in 30 seconds 12/02/21    Time 6  Period Weeks    Status Achieved    Target Date 12/18/21      PT LONG TERM GOAL #4   Title Patient will be able to complete a five time sit to stand test in 12 seconds or less to reduce her fall risk.    Time 3    Period Weeks    Status New    Target Date 12/24/21                   Plan - 12/23/21 1333     Clinical Impression Statement Pt arrived today doing fair. She was able to continue with some exs, but still needed short rest periods. Exs limited by fatigue today. She reports doing better at home with ADL's    Personal Factors and Comorbidities Fitness;Comorbidity 1;Comorbidity 2;Comorbidity 3+    Comorbidities IBS, chronic low back pain, right TKA, HTN, and liver cerrhosis secondary to NASH    Examination-Activity Limitations Locomotion Level;Transfers;Caring for Others;Stand    Examination-Participation Restrictions Occupation;Community Activity;Shop    Stability/Clinical Decision Making Evolving/Moderate complexity    PT Treatment/Interventions ADLs/Self Care Home Management;Electrical Stimulation;Gait training;Stair training;Functional mobility training;Therapeutic activities;Therapeutic exercise;Balance training;Neuromuscular re-education;Patient/family education;Energy conservation    PT Next Visit Plan cont with nustep and global strengthening, progress per tolerance (sent to PT for goal progression)             Patient will benefit from skilled therapeutic intervention in order to improve the following deficits and impairments:  Difficulty walking, Decreased endurance, Decreased activity tolerance, Decreased balance, Decreased mobility, Decreased strength  Visit Diagnosis: Muscle weakness (generalized)     Problem List Patient Active Problem List   Diagnosis Date Noted   Colon cancer screening    Mucosal abnormality of colon     Coronary artery disease involving native coronary artery of native heart without angina pectoris    Adnexal mass 10/25/2020   Elevated cancer antigen 125 (CA 125) 10/25/2020   Thickened endometrium 10/25/2020   Portal hypertensive gastropathy ([place]) 02/29/2020   Recurrent UTI 06/12/2019   Secondary insomnia 04/10/2019   Anemia 02/28/2019   Thrombocytopenia ([place]) 02/28/2019   Degeneration of lumbar intervertebral disc 08/06/2018   Secondary esophageal varices without bleeding ([place]) 06/03/2018   Encephalopathy, hepatic 05/13/2018   History of paroxysmal supraventricular tachycardia    Liver cirrhosis secondary to NASH ([place]) 04/23/2018   Irritable bowel syndrome-diarrhea predominant 06/15/2017   Gastroesophageal reflux disease 06/15/2017   Hyperlipidemia    Kidney stone    Essential hypertension 02/13/2010    Jamere Stidham,CHRIS, PTA 12/23/2021, 1:37 PM  [facility] [address] [place], [place], [address] Phone: [phone]   Fax:  [phone]  Name: Consuella Scurlock MRN: [id number] Date of Birth: Nov 21, 1956

## 2021-12-25 ENCOUNTER — Other Ambulatory Visit: Payer: Self-pay

## 2021-12-25 ENCOUNTER — Ambulatory Visit: Payer: Medicare Other | Admitting: *Deleted

## 2021-12-25 DIAGNOSIS — M6281 Muscle weakness (generalized): Secondary | ICD-10-CM | POA: Diagnosis not present

## 2021-12-25 NOTE — Therapy (Signed)
[facility] [place], [place], [address] Phone: [phone]   Fax:  [phone]  Physical Therapy Treatment  Patient Details  Name: Gailya Tauer MRN: [id number] Date of Birth: 1956-04-06 Referring Provider (PT): Vernon Prey   Encounter Date: 12/25/2021   PT End of Session - 12/25/21 7253     Visit Number 10    Number of Visits 12    Date for PT Re-Evaluation 01/09/22    PT Start Time 1030    PT Stop Time 1118    PT Time Calculation (min) 48 min             Past Medical History:  Diagnosis Date   Ascites    Dysrhythmia    hx of SVT   Encephalopathy, hepatic 05/13/2018   GERD (gastroesophageal reflux disease)    History of exercise stress test    03-07-2010  Stress echo--- no arrhythmias or conduction abnormalilites and negative for ischemia or chest pain   History of kidney stones    History of paroxysmal supraventricular tachycardia    episode 2011  consult w/ dr Caryl Comes --  put on atenolol--  per pt no longer an issue   HTN (hypertension)    cardiologist --  dr Stanford Breed   IBS (irritable bowel syndrome)    diarrhea   Nonalcoholic steatohepatitis (NASH)    Numbness and tingling of left lower extremity    post achilles tendon repair   OA (osteoarthritis)    knees   Pleural effusion on right    hepatic hydrothorax   PMB (postmenopausal bleeding) none recent   thickened endometrium   PONV (postoperative nausea and vomiting)    Recurrent UTI none since 2019   Secondary esophageal varices without bleeding ([place]) 06/03/2018   Vitamin D deficiency    Wears glasses     Past Surgical History:  Procedure Laterality Date   ACHILLES TENDON SURGERY Left 06/2016   BIOPSY  11/25/2021   Procedure: BIOPSY;  Surgeon: Gatha Mayer, MD;  Location: [facility];  Service: Endoscopy;;   BREAST BIOPSY Left 02/2016   benign   COLONOSCOPY  2005   COLONOSCOPY WITH PROPOFOL N/A 11/25/2021   Procedure: COLONOSCOPY WITH  PROPOFOL;  Surgeon: Gatha Mayer, MD;  Location: [facility];  Service: Endoscopy;  Laterality: N/A;   CT CTA CORONARY W/CA SCORE W/CM &/OR WO/CM  01/01/2014   non-obstructive calcified plaque in pLAD (0-25%), no significant incidental noncardiac findings noted   DILATATION & CURETTAGE/HYSTEROSCOPY WITH MYOSURE N/A 12/02/2020   Procedure: DILATATION & CURETTAGE/HYSTEROSCOPY WITH POLYPECTOMY AND REMOVAL OF VAGINAL LESION;  Surgeon: [name], MD;  Location: [facility];  Service: Gynecology;  Laterality: N/A;  request to follow in [place] held (DR. Delilah Shan has two other cases that morning starting 7:30am)   ESOPHAGOGASTRODUODENOSCOPY  01/2014   EXTRACORPOREAL SHOCK WAVE LITHOTRIPSY  2010   KNEE ARTHROPLASTY Right 03/24/2018   Procedure: RIGHT TOTAL KNEE ARTHROPLASTY WITH COMPUTER NAVIGATION;  Surgeon: Rod Can, MD;  [facility]: [facility];  Service: Orthopedics;  Laterality: Right;  Needs RNFA   KNEE ARTHROSCOPY Right 12/04/2016   Procedure: ARTHROSCOPY KNEE WITH PARTIAL MEDIAL MENISCECTOMY;  Surgeon: Rod Can, MD;  Location: [facility];  Service: Orthopedics;  Laterality: Right;   LEFT HEART CATH AND CORONARY ANGIOGRAPHY N/A 11/20/2021   Procedure: LEFT HEART CATH AND CORONARY ANGIOGRAPHY;  Surgeon: Burnell Blanks, MD;  Location: [facility];  Service: Cardiovascular;  Laterality: N/A;   LEFT  HEART CATHETERIZATION WITH CORONARY ANGIOGRAM N/A 01/11/2012   Procedure: LEFT HEART CATHETERIZATION WITH CORONARY ANGIOGRAM;  Surgeon: Sherren Mocha, MD;  Location: [facility];  Service: Cardiovascular;  Laterality: N/A;  widely patent coronary arterires without significant obstructive CAD,  normal LVF, ef 55-56%   TONSILLECTOMY AND ADENOIDECTOMY  child   TRANSTHORACIC ECHOCARDIOGRAM  01/18/2012   mild LVH,  ef 60%/  trivial TR   TUBAL LIGATION  1985   UPPER GASTROINTESTINAL ENDOSCOPY  last done 2019    There were no vitals filed  for this visit.   Subjective Assessment - 12/25/21 1042     Subjective COVID-19 screen performed prior to patient entering clinic. Reporting back pain and feeling very nauseous still from the medicine again. Did okay after last Rx    Pertinent History IBS, chronic low back pain, right TKA, HTN, and liver cerrhosis secondary to NASH    Limitations Walking    How long can you stand comfortably? 10-15 minutes    How long can you walk comfortably? 10-15 minutes    Patient Stated Goals get stronger, walk longer, return to work                               [facility] Adult PT Treatment/Exercise - 12/25/21 0001       Exercises   Exercises Knee/Hip;Lumbar      Lumbar Exercises: Machines for Strengthening   Cybex Lumbar Extension 40# x 4 minutes    Cybex Knee Extension --      Knee/Hip Exercises: Aerobic   Nustep L4 x15 min      Knee/Hip Exercises: Machines for Strengthening   Cybex Knee Extension 10# x 3 minutes.    Cybex Knee Flexion 30# x 3 minutes      Knee/Hip Exercises: Standing   Rocker Board 3 minutes                       PT Short Term Goals - 12/02/21 0941       PT SHORT TERM GOAL #1   Title Patient will be able to complete a five time sit to stand test without being limited by fatigue.    Baseline 9 reps today 12/02/21    Time 4    Period Weeks    Status Achieved    Target Date 12/04/21               PT Long Term Goals - 12/02/21 0942       PT LONG TERM GOAL #1   Title Patient will be independent with her HEP.    Baseline HEP provided today 12/02/21    Time 6    Period Weeks    Status On-going    Target Date 12/18/21      PT LONG TERM GOAL #2   Title Patient will be able to stand and walk at least 30 minutes prior to being limited by fatigue.    Baseline 30-45 minutes reported 12/02/21    Time 6    Period Weeks    Status Achieved      PT LONG TERM GOAL #3   Title Patient will be able to complete a five time sit to  stand test in 30 seconds or less.    Baseline able to complete more than 5 in 30 seconds 12/02/21    Time 6    Period Weeks    Status Achieved  Target Date 12/18/21      PT LONG TERM GOAL #4   Title Patient will be able to complete a five time sit to stand test in 12 seconds or less to reduce her fall risk.    Time 3    Period Weeks    Status New    Target Date 12/24/21                   Plan - 12/25/21 1625     Clinical Impression Statement Pt arrived today doing a little better with energy levels and less LBP. She was able to progress with added exs and tolerated well with  mainly fatigue end of session.    Personal Factors and Comorbidities Fitness;Comorbidity 1;Comorbidity 2;Comorbidity 3+    Comorbidities IBS, chronic low back pain, right TKA, HTN, and liver cerrhosis secondary to NASH    Examination-Activity Limitations Locomotion Level;Transfers;Caring for Others;Stand    Examination-Participation Restrictions Occupation;Community Activity;Shop    Stability/Clinical Decision Making Evolving/Moderate complexity    PT Frequency 2x / week    PT Duration 6 weeks    PT Treatment/Interventions ADLs/Self Care Home Management;Electrical Stimulation;Gait training;Stair training;Functional mobility training;Therapeutic activities;Therapeutic exercise;Balance training;Neuromuscular re-education;Patient/family education;Energy conservation    PT Next Visit Plan cont with nustep and global strengthening, progress per tolerance (sent to PT for goal progression)    Consulted and Agree with Plan of Care Patient             Patient will benefit from skilled therapeutic intervention in order to improve the following deficits and impairments:  Difficulty walking, Decreased endurance, Decreased activity tolerance, Decreased balance, Decreased mobility, Decreased strength  Visit Diagnosis: Muscle weakness (generalized)     Problem List Patient Active Problem List    Diagnosis Date Noted   Colon cancer screening    Mucosal abnormality of colon    Coronary artery disease involving native coronary artery of native heart without angina pectoris    Adnexal mass 10/25/2020   Elevated cancer antigen 125 (CA 125) 10/25/2020   Thickened endometrium 10/25/2020   Portal hypertensive gastropathy ([place]) 02/29/2020   Recurrent UTI 06/12/2019   Secondary insomnia 04/10/2019   Anemia 02/28/2019   Thrombocytopenia ([place]) 02/28/2019   Degeneration of lumbar intervertebral disc 08/06/2018   Secondary esophageal varices without bleeding ([place]) 06/03/2018   Encephalopathy, hepatic 05/13/2018   History of paroxysmal supraventricular tachycardia    Liver cirrhosis secondary to NASH ([place]) 04/23/2018   Irritable bowel syndrome-diarrhea predominant 06/15/2017   Gastroesophageal reflux disease 06/15/2017   Hyperlipidemia    Kidney stone    Essential hypertension 02/13/2010    Doyel Mulkern,CHRIS, PTA 12/25/2021, 4:31 PM  [facility] [address] [place], [place], [address] Phone: [phone]   Fax:  [phone]  Name: Kenlei Safi MRN: [id number] Date of Birth: 20-Aug-1956

## 2021-12-26 ENCOUNTER — Encounter: Payer: Self-pay | Admitting: Obstetrics and Gynecology

## 2021-12-26 ENCOUNTER — Telehealth: Payer: Self-pay | Admitting: Cardiology

## 2021-12-26 ENCOUNTER — Encounter: Payer: Self-pay | Admitting: Cardiology

## 2021-12-26 NOTE — Telephone Encounter (Signed)
Appointments see conversation.

## 2021-12-26 NOTE — Telephone Encounter (Signed)
Advised patient these have been shared and to let Dorene Ar at [facility] know  Patient verbalized understanding

## 2021-12-26 NOTE — Telephone Encounter (Signed)
° °  Pt is calling to f/u her mychart message, advised Hilda Blades is off today, she requested if there's nurse covering for her can call her back today

## 2021-12-26 NOTE — Telephone Encounter (Signed)
This has been done, see phone note 1/6

## 2021-12-30 ENCOUNTER — Other Ambulatory Visit: Payer: Self-pay

## 2021-12-30 ENCOUNTER — Ambulatory Visit: Payer: Medicare Other | Admitting: Physical Therapy

## 2021-12-30 DIAGNOSIS — M6281 Muscle weakness (generalized): Secondary | ICD-10-CM | POA: Diagnosis not present

## 2021-12-30 NOTE — Therapy (Addendum)
[facility] [place], [place], [address] Phone: [phone]   Fax:  [phone]  Physical Therapy Treatment  Patient Details  Name: Ariele Vidrio MRN: [id number] Date of Birth: 03-13-1956 Referring Provider (PT): Vernon Prey   Encounter Date: 12/30/2021   PT End of Session - 12/30/21 1158     Visit Number 11    Number of Visits 12    Date for PT Re-Evaluation 01/09/22    PT Start Time 1030    PT Stop Time 1111    PT Time Calculation (min) 41 min    Activity Tolerance Patient limited by fatigue;Patient tolerated treatment well    Behavior During Therapy [facility] for tasks assessed/performed             Past Medical History:  Diagnosis Date   Ascites    Dysrhythmia    hx of SVT   Encephalopathy, hepatic 05/13/2018   GERD (gastroesophageal reflux disease)    History of exercise stress test    03-07-2010  Stress echo--- no arrhythmias or conduction abnormalilites and negative for ischemia or chest pain   History of kidney stones    History of paroxysmal supraventricular tachycardia    episode 2011  consult w/ dr Caryl Comes --  put on atenolol--  per pt no longer an issue   HTN (hypertension)    cardiologist --  dr Stanford Breed   IBS (irritable bowel syndrome)    diarrhea   Nonalcoholic steatohepatitis (NASH)    Numbness and tingling of left lower extremity    post achilles tendon repair   OA (osteoarthritis)    knees   Pleural effusion on right    hepatic hydrothorax   PMB (postmenopausal bleeding) none recent   thickened endometrium   PONV (postoperative nausea and vomiting)    Recurrent UTI none since 2019   Secondary esophageal varices without bleeding ([place]) 06/03/2018   Vitamin D deficiency    Wears glasses     Past Surgical History:  Procedure Laterality Date   ACHILLES TENDON SURGERY Left 06/2016   BIOPSY  11/25/2021   Procedure: BIOPSY;  Surgeon: Gatha Mayer, MD;  [facility];  Service:  Endoscopy;;   BREAST BIOPSY Left 02/2016   benign   COLONOSCOPY  2005   COLONOSCOPY WITH PROPOFOL N/A 11/25/2021   Procedure: COLONOSCOPY WITH PROPOFOL;  Surgeon: Gatha Mayer, MD;  [facility];  Service: Endoscopy;  Laterality: N/A;   CT CTA CORONARY W/CA SCORE W/CM &/OR WO/CM  01/01/2014   non-obstructive calcified plaque in pLAD (0-25%), no significant incidental noncardiac findings noted   DILATATION & CURETTAGE/HYSTEROSCOPY WITH MYOSURE N/A 12/02/2020   Procedure: DILATATION & CURETTAGE/HYSTEROSCOPY WITH POLYPECTOMY AND REMOVAL OF VAGINAL LESION;  Surgeon: [name], MD;  Location: [facility];  Service: Gynecology;  Laterality: N/A;  request to follow in [place] held (DR. Delilah Shan has two other cases that morning starting 7:30am)   ESOPHAGOGASTRODUODENOSCOPY  01/2014   EXTRACORPOREAL SHOCK WAVE LITHOTRIPSY  2010   KNEE ARTHROPLASTY Right 03/24/2018   Procedure: RIGHT TOTAL KNEE ARTHROPLASTY WITH COMPUTER NAVIGATION;  Surgeon: Rod Can, MD;  [facility];  Service: Orthopedics;  Laterality: Right;  Needs RNFA   KNEE ARTHROSCOPY Right 12/04/2016   Procedure: ARTHROSCOPY KNEE WITH PARTIAL MEDIAL MENISCECTOMY;  Surgeon: Rod Can, MD;  Location: [facility];  Service: Orthopedics;  Laterality: Right;   LEFT HEART CATH AND CORONARY ANGIOGRAPHY N/A 11/20/2021   Procedure: LEFT HEART CATH AND CORONARY  ANGIOGRAPHY;  Surgeon: Burnell Blanks, MD;  Location: [facility];  Service: Cardiovascular;  Laterality: N/A;   LEFT HEART CATHETERIZATION WITH CORONARY ANGIOGRAM N/A 01/11/2012   Procedure: LEFT HEART CATHETERIZATION WITH CORONARY ANGIOGRAM;  Surgeon: Sherren Mocha, MD;  Location: [facility];  Service: Cardiovascular;  Laterality: N/A;  widely patent coronary arterires without significant obstructive CAD,  normal LVF, ef 55-56%   TONSILLECTOMY AND ADENOIDECTOMY  child   TRANSTHORACIC ECHOCARDIOGRAM   01/18/2012   mild LVH,  ef 60%/  trivial TR   TUBAL LIGATION  1985   UPPER GASTROINTESTINAL ENDOSCOPY  last done 2019    There were no vitals filed for this visit.   Subjective Assessment - 12/30/21 1059     Subjective COVID-19 screen performed prior to patient entering clinic. Exercise helps.    Pertinent History IBS, chronic low back pain, right TKA, HTN, and liver cerrhosis secondary to NASH    Limitations Walking                               [facility] Adult PT Treatment/Exercise - 12/30/21 0001       Neuro Re-ed    Neuro Re-ed Details  Rockerboard 3 minutes forward/back and 3 minutes side to side.      Exercises   Exercises Knee/Hip      Lumbar Exercises: Aerobic   Nustep Level 3 x 15 minutes.      Lumbar Exercises: Machines for Strengthening   Cybex Lumbar Extension 50# x 3 minutes.    Cybex Knee Extension 10# x 3 minutes.    Cybex Knee Flexion 30# x 3 minutes.    Other Lumbar Machine Exercise Ab machine @ 50# x 3 minutes.                       PT Short Term Goals - 12/02/21 0941       PT SHORT TERM GOAL #1   Title Patient will be able to complete a five time sit to stand test without being limited by fatigue.    Baseline 9 reps today 12/02/21    Time 4    Period Weeks    Status Achieved    Target Date 12/04/21               PT Long Term Goals - 12/02/21 0942       PT LONG TERM GOAL #1   Title Patient will be independent with her HEP.    Baseline HEP provided today 12/02/21    Time 6    Period Weeks    Status On-going    Target Date 12/18/21      PT LONG TERM GOAL #2   Title Patient will be able to stand and walk at least 30 minutes prior to being limited by fatigue.    Baseline 30-45 minutes reported 12/02/21    Time 6    Period Weeks    Status Achieved      PT LONG TERM GOAL #3   Title Patient will be able to complete a five time sit to stand test in 30 seconds or less.    Baseline able to complete more  than 5 in 30 seconds 12/02/21    Time 6    Period Weeks    Status Achieved    Target Date 12/18/21      PT LONG TERM GOAL #4   Title  Patient will be able to complete a five time sit to stand test in 12 seconds or less to reduce her fall risk.    Time 3    Period Weeks    Status New    Target Date 12/24/21                   Plan - 12/30/21 1130     Clinical Impression Statement Patient pleased with progress.  She did well with balance exercise today with minimal use of hands to maintain balance.    Personal Factors and Comorbidities Fitness;Comorbidity 1;Comorbidity 2;Comorbidity 3+    Comorbidities IBS, chronic low back pain, right TKA, HTN, and liver cerrhosis secondary to NASH    Examination-Activity Limitations Locomotion Level;Transfers;Caring for Others;Stand    Examination-Participation Restrictions Occupation;Community Activity;Shop    Rehab Potential Good    PT Frequency 2x / week    PT Duration 6 weeks    PT Treatment/Interventions ADLs/Self Care Home Management;Electrical Stimulation;Gait training;Stair training;Functional mobility training;Therapeutic activities;Therapeutic exercise;Balance training;Neuromuscular re-education;Patient/family education;Energy conservation    PT Next Visit Plan cont with nustep and global strengthening, progress per tolerance (sent to PT for goal progression)    Consulted and Agree with Plan of Care Patient             Patient will benefit from skilled therapeutic intervention in order to improve the following deficits and impairments:     Visit Diagnosis: Muscle weakness (generalized)     Problem List Patient Active Problem List   Diagnosis Date Noted   Colon cancer screening    Mucosal abnormality of colon    Coronary artery disease involving native coronary artery of native heart without angina pectoris    Adnexal mass 10/25/2020   Elevated cancer antigen 125 (CA 125) 10/25/2020   Thickened endometrium 10/25/2020    Portal hypertensive gastropathy ([place]) 02/29/2020   Recurrent UTI 06/12/2019   Secondary insomnia 04/10/2019   Anemia 02/28/2019   Thrombocytopenia ([place]) 02/28/2019   Degeneration of lumbar intervertebral disc 08/06/2018   Secondary esophageal varices without bleeding ([place]) 06/03/2018   Encephalopathy, hepatic 05/13/2018   History of paroxysmal supraventricular tachycardia    Liver cirrhosis secondary to NASH ([place]) 04/23/2018   Irritable bowel syndrome-diarrhea predominant 06/15/2017   Gastroesophageal reflux disease 06/15/2017   Hyperlipidemia    Kidney stone    Essential hypertension 02/13/2010    Dayshia Ballinas, [place], PT 12/30/2021, 12:00 PM  [facility] [place], [place], [address] Phone: [phone]   Fax:  [phone]  Name: Janielle Mittelstadt MRN: [id number] Date of Birth: 09-08-1956

## 2022-01-01 ENCOUNTER — Ambulatory Visit: Payer: Medicare Other

## 2022-01-01 ENCOUNTER — Other Ambulatory Visit: Payer: Self-pay

## 2022-01-01 DIAGNOSIS — M6281 Muscle weakness (generalized): Secondary | ICD-10-CM

## 2022-01-01 NOTE — Therapy (Addendum)
[facility] [place], [place], [address] Phone: [phone]   Fax:  [phone]  Physical Therapy Treatment  Patient Details  Name: Dawn Moon MRN: [id number] Date of Birth: 04/15/1956 Referring Provider (PT): Vernon Prey   Encounter Date: 01/01/2022   PT End of Session - 01/01/22 1036     Visit Number 12    Number of Visits 12    Date for PT Re-Evaluation 01/09/22    PT Start Time 1030    PT Stop Time 1115    PT Time Calculation (min) 45 min             Past Medical History:  Diagnosis Date   Ascites    Dysrhythmia    hx of SVT   Encephalopathy, hepatic 05/13/2018   GERD (gastroesophageal reflux disease)    History of exercise stress test    03-07-2010  Stress echo--- no arrhythmias or conduction abnormalilites and negative for ischemia or chest pain   History of kidney stones    History of paroxysmal supraventricular tachycardia    episode 2011  consult w/ dr Caryl Comes --  put on atenolol--  per pt no longer an issue   HTN (hypertension)    cardiologist --  dr Stanford Breed   IBS (irritable bowel syndrome)    diarrhea   Nonalcoholic steatohepatitis (NASH)    Numbness and tingling of left lower extremity    post achilles tendon repair   OA (osteoarthritis)    knees   Pleural effusion on right    hepatic hydrothorax   PMB (postmenopausal bleeding) none recent   thickened endometrium   PONV (postoperative nausea and vomiting)    Recurrent UTI none since 2019   Secondary esophageal varices without bleeding ([place]) 06/03/2018   Vitamin D deficiency    Wears glasses     Past Surgical History:  Procedure Laterality Date   ACHILLES TENDON SURGERY Left 06/2016   BIOPSY  11/25/2021   Procedure: BIOPSY;  Surgeon: Gatha Mayer, MD;  Location: [facility];  Service: Endoscopy;;   BREAST BIOPSY Left 02/2016   benign   COLONOSCOPY  2005   COLONOSCOPY WITH PROPOFOL N/A 11/25/2021   Procedure: COLONOSCOPY WITH  PROPOFOL;  Surgeon: Gatha Mayer, MD;  Location: [facility];  Service: Endoscopy;  Laterality: N/A;   CT CTA CORONARY W/CA SCORE W/CM &/OR WO/CM  01/01/2014   non-obstructive calcified plaque in pLAD (0-25%), no significant incidental noncardiac findings noted   DILATATION & CURETTAGE/HYSTEROSCOPY WITH MYOSURE N/A 12/02/2020   Procedure: DILATATION & CURETTAGE/HYSTEROSCOPY WITH POLYPECTOMY AND REMOVAL OF VAGINAL LESION;  Surgeon: [name], MD;  Location: [facility];  Service: Gynecology;  Laterality: N/A;  request to follow in [place] held (DR. Delilah Shan has two other cases that morning starting 7:30am)   ESOPHAGOGASTRODUODENOSCOPY  01/2014   EXTRACORPOREAL SHOCK WAVE LITHOTRIPSY  2010   KNEE ARTHROPLASTY Right 03/24/2018   Procedure: RIGHT TOTAL KNEE ARTHROPLASTY WITH COMPUTER NAVIGATION;  Surgeon: Rod Can, MD;  Location: [facility];  Service: Orthopedics;  Laterality: Right;  Needs RNFA   KNEE ARTHROSCOPY Right 12/04/2016   Procedure: ARTHROSCOPY KNEE WITH PARTIAL MEDIAL MENISCECTOMY;  Surgeon: Rod Can, MD;  Location: [facility];  Service: Orthopedics;  Laterality: Right;   LEFT HEART CATH AND CORONARY ANGIOGRAPHY N/A 11/20/2021   Procedure: LEFT HEART CATH AND CORONARY ANGIOGRAPHY;  Surgeon: Burnell Blanks, MD;  Location: [facility];  Service: Cardiovascular;  Laterality: N/A;   LEFT  HEART CATHETERIZATION WITH CORONARY ANGIOGRAM N/A 01/11/2012   Procedure: LEFT HEART CATHETERIZATION WITH CORONARY ANGIOGRAM;  Surgeon: Sherren Mocha, MD;  Location: [facility];  Service: Cardiovascular;  Laterality: N/A;  widely patent coronary arterires without significant obstructive CAD,  normal LVF, ef 55-56%   TONSILLECTOMY AND ADENOIDECTOMY  child   TRANSTHORACIC ECHOCARDIOGRAM  01/18/2012   mild LVH,  ef 60%/  trivial TR   TUBAL LIGATION  1985   UPPER GASTROINTESTINAL ENDOSCOPY  last done 2019    There were no vitals filed  for this visit.   Subjective Assessment - 01/01/22 1035     Subjective COVID-19 screen performed prior to patient entering clinic. Pt arrives for today's treatment session reporting mod back and abdominal pain.  Pt states that she feels "very shakey today."  Pt states she will do as much as she can today.    Pertinent History IBS, chronic low back pain, right TKA, HTN, and liver cerrhosis secondary to NASH    Limitations Walking    Currently in Pain? Yes    Pain Location Back                               [facility] Adult PT Treatment/Exercise - 01/01/22 0001       Lumbar Exercises: Aerobic   Nustep Lvl 4 x 15 mins      Lumbar Exercises: Machines for Strengthening   Cybex Lumbar Extension 50# x 3 minutes.    Cybex Knee Extension 10# x 3 minutes.    Cybex Knee Flexion 30# x 3 minutes.    Other Lumbar Machine Exercise Ab machine @ 50# x 3 minutes.      Knee/Hip Exercises: Standing   Rocker Board 4 minutes                       PT Short Term Goals - 12/02/21 0941       PT SHORT TERM GOAL #1   Title Patient will be able to complete a five time sit to stand test without being limited by fatigue.    Baseline 9 reps today 12/02/21    Time 4    Period Weeks    Status Achieved    Target Date 12/04/21               PT Long Term Goals - 01/01/22 1037       PT LONG TERM GOAL #1   Title Patient will be independent with her HEP.    Baseline HEP provided today 12/02/21    Time 6    Period Weeks    Status Achieved    Target Date 12/18/21      PT LONG TERM GOAL #2   Title Patient will be able to stand and walk at least 30 minutes prior to being limited by fatigue.    Baseline 30-45 minutes reported 12/02/21    Time 6    Period Weeks    Status Achieved      PT LONG TERM GOAL #3   Title Patient will be able to complete a five time sit to stand test in 30 seconds or less.    Baseline able to complete more than 5 in 30 seconds 12/02/21     Time 6    Period Weeks    Status Achieved    Target Date 12/18/21      PT LONG TERM GOAL #4  Title Patient will be able to complete a five time sit to stand test in 12 seconds or less to reduce her fall risk.    Baseline 01/01/22: 21 seconds    Time 3    Period Weeks    Status On-going    Target Date 12/24/21                   Plan - 01/01/22 1036     Clinical Impression Statement Pt arrives for today's treatment session reporting mod back and abdominal pain.  Pt states that she feels "very shakey today."  Pt's able to perform 5 STS x 21 seconds without use of bilateral UEs.  Pt given written instructions for the amount of weight and time to perform on each machine at the local fitness center.  Pt with increased fatigue at completion of today's treatment session, but denies any pain.  Pt ready for discharge at this time.    Personal Factors and Comorbidities Fitness;Comorbidity 1;Comorbidity 2;Comorbidity 3+    Comorbidities IBS, chronic low back pain, right TKA, HTN, and liver cerrhosis secondary to NASH    Examination-Activity Limitations Locomotion Level;Transfers;Caring for Others;Stand    Examination-Participation Restrictions Occupation;Community Activity;Shop    Rehab Potential Good    PT Frequency 2x / week    PT Duration 6 weeks    PT Treatment/Interventions ADLs/Self Care Home Management;Electrical Stimulation;Gait training;Stair training;Functional mobility training;Therapeutic activities;Therapeutic exercise;Balance training;Neuromuscular re-education;Patient/family education;Energy conservation    PT Next Visit Plan cont with nustep and global strengthening, progress per tolerance (sent to PT for goal progression)    Consulted and Agree with Plan of Care Patient             Patient will benefit from skilled therapeutic intervention in order to improve the following deficits and impairments:     Visit Diagnosis: Muscle weakness (generalized)     Problem  List Patient Active Problem List   Diagnosis Date Noted   Colon cancer screening    Mucosal abnormality of colon    Coronary artery disease involving native coronary artery of native heart without angina pectoris    Adnexal mass 10/25/2020   Elevated cancer antigen 125 (CA 125) 10/25/2020   Thickened endometrium 10/25/2020   Portal hypertensive gastropathy ([place]) 02/29/2020   Recurrent UTI 06/12/2019   Secondary insomnia 04/10/2019   Anemia 02/28/2019   Thrombocytopenia ([place]) 02/28/2019   Degeneration of lumbar intervertebral disc 08/06/2018   Secondary esophageal varices without bleeding ([place]) 06/03/2018   Encephalopathy, hepatic 05/13/2018   History of paroxysmal supraventricular tachycardia    Liver cirrhosis secondary to NASH ([place]) 04/23/2018   Irritable bowel syndrome-diarrhea predominant 06/15/2017   Gastroesophageal reflux disease 06/15/2017   Hyperlipidemia    Kidney stone    Essential hypertension 02/13/2010    Kathrynn Ducking, PTA 01/01/2022, [facility] [place], [place], [address] Phone: [phone]   Fax:  [phone]  Name: Dawn Moon MRN: [id number] Date of Birth: 1956/02/14  PHYSICAL THERAPY DISCHARGE SUMMARY  Visits from Start of Care: 12  Current functional level related to goals / functional outcomes: Patient was able to meet all of her goals excluding her five time sit to stand goal of 12 seconds or less. However, she was able to demonstrate a nine second improvement since her progress report on 12/02/21.   Remaining deficits: Endurance and power   Education / Equipment: HEP    Patient agrees to discharge. Patient goals were partially met.  Patient is being discharged due to being pleased with the current functional level.  Jacqulynn Cadet, PT, DPT

## 2022-01-12 ENCOUNTER — Encounter: Payer: Self-pay | Admitting: Internal Medicine

## 2022-01-13 NOTE — Telephone Encounter (Signed)
Pt states that [facility] has not sent in disability updates after several requests stating that they have not received the results from her recent Cardiac Cath that was done 11/20/2021. Pt states that her daughter is taking the CD with the results from the Cardiac Cath to [facility] today. Pt was notified that  we are [facility] affiliated  and ultimately cannot assist with [facility] and there process of how they handle things:  Pt verbalized understanding with all questions answered.

## 2022-02-17 ENCOUNTER — Ambulatory Visit: Payer: Medicare Other | Admitting: Cardiology

## 2022-02-18 ENCOUNTER — Telehealth (HOSPITAL_COMMUNITY): Payer: Self-pay

## 2022-02-19 ENCOUNTER — Encounter (HOSPITAL_COMMUNITY): Payer: Self-pay | Admitting: *Deleted

## 2022-02-19 NOTE — Progress Notes (Signed)
Received phone call from pt wanting to schedule for cardiac rehab.  At the present, we did not have a referral from [facility] for her.  Reviewed medical history and spoke with Feliz Beam NP with Dr. Edwin Dada who confirmed that this pt would need to complete cardiac rehab prior to liver transplant. Pt completed initial cardiology consult on 10/21/21.  Pt with positive stress echo with no exertional symptoms, low functional capacity(5.8 MET) goal to optimize functional capacity with cardiac rehab.  Cardiac cath on 12/1 showed 50-60% small caliber vessel suggest medical management. Pt completed physical therapy for lower back.  Pt presently ambulating one mile a day.  MD referral form sent to Dr. Edwin Dada along with request for 12 lead ekg.  Once received back, ok to contact and schedule.  Pt made aware of the wait time however due to the time sensitivity with CR to be completed prior to liver transplant will add to cancellation list. Maurice Small RN, BSN ?Cardiac and Pulmonary Rehab Nurse Navigator  ? ?

## 2022-02-23 NOTE — Telephone Encounter (Signed)
Called patient to see if she was interested in participating in the Cardiac Rehab Program. Patient stated yes. Patient will come in for orientation on 03/12/22 @ 9AM and will attend the 1PM exercise class. Went over insurance, patient verbalized understanding.   ?  ?Tourist information centre manager.  ?

## 2022-02-23 NOTE — Telephone Encounter (Signed)
Called patient to see if she was interested in participating in the Cardiac Rehab Program. Patient stated yes. Patient will come in for orientation on 03/12/22 @ 8AM and will attend the 1PM exercise class. Went over insurance, patient verbalized understanding.   ?  ?Tourist information centre manager.  ?

## 2022-02-25 ENCOUNTER — Ambulatory Visit: Payer: PRIVATE HEALTH INSURANCE | Admitting: Obstetrics and Gynecology

## 2022-02-26 ENCOUNTER — Telehealth: Payer: Self-pay | Admitting: Internal Medicine

## 2022-02-26 NOTE — Telephone Encounter (Signed)
Good Morning Dr. Carlean Purl,  ? ? ?Patient called wanting to schedule an OV with you. Patient was seen at [facility] in January. Patient stated that you referred her to [facility] so she could have a liver transplant. Patient advised me that she was supposed to have procedure done and the day before they canceled  her. Patient is wanting to come talk to you to see if you can refer her elsewhere.  ? ?Can patient be scheduled with you for OV? Please advise on scheduling. ? ?Thank you.   ?

## 2022-02-27 NOTE — Telephone Encounter (Signed)
Referral  to [facility] regarding end-stage liver disease/cirrhosis evaluate for transplant has been faxed to  ?[phone] along with pt records: ?Pt made aware: ?Pt verbalized understanding with all questions answered.  ? ?

## 2022-02-27 NOTE — Telephone Encounter (Signed)
I spoke to the patient.  She does not need to come in and see me. ? ?She is looking for referral to another liver transplant center to be considered for liver transplant.  [facility] is recommended she do cardiac rehab for 3 months and take Lipitor due to cardiac work-up findings.  There is difficulty with appropriate medical indication for cardiac rehab per patient.  She is looking for different input. ? ?Dawn Moon, please refer to [facility] regarding end-stage liver disease/cirrhosis evaluate for transplant.  Has been evaluated at [facility]. ?

## 2022-03-06 ENCOUNTER — Telehealth (HOSPITAL_COMMUNITY): Payer: Self-pay

## 2022-03-06 NOTE — Telephone Encounter (Signed)
Successful telephone encounter to patient to confirm cardiac rehab orientation appointment for 03/12/22 at 9 am. Health history completed. Confirmed patient has received directions and cardiac rehab contact information via mail. All questions answered. ?

## 2022-03-12 ENCOUNTER — Encounter (HOSPITAL_COMMUNITY): Payer: Self-pay

## 2022-03-12 ENCOUNTER — Other Ambulatory Visit: Payer: Self-pay

## 2022-03-12 ENCOUNTER — Encounter (HOSPITAL_COMMUNITY)
Admission: RE | Admit: 2022-03-12 | Discharge: 2022-03-12 | Disposition: A | Discharge status: Home / Self Care | Payer: Medicare Other | Source: Ambulatory Visit | Attending: Cardiology | Admitting: Cardiology

## 2022-03-12 VITALS — BP 102/72 | HR 68 | Ht 63.0 in | Wt 160.1 lb

## 2022-03-12 DIAGNOSIS — I208 Other forms of angina pectoris: Secondary | ICD-10-CM | POA: Insufficient documentation

## 2022-03-12 NOTE — Progress Notes (Signed)
Cardiac Rehab Medication Review by a Nurse ? ?Does the patient  feel that his/her medications are working for him/her?  YES  ? ?Has the patient been experiencing any side effects to the medications prescribed?  NO ? ?Does the patient measure his/her own blood pressure or blood glucose at home?  YES ? ?Does the patient have any problems obtaining medications due to transportation or finances?   NO ? ?Understanding of regimen: excellent ?Understanding of indications: excellent ?Potential of compliance: excellent ? ? ? 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 Dawn Moon has a BP monitor but does not check her blood pressures on a daily basis. ? ? ? ?Harrell Gave RN ?03/12/2022 12:29 PM ?  ?

## 2022-03-12 NOTE — Progress Notes (Signed)
Cardiac Individual Treatment Plan ? ?Patient Details  ?Name: Dawn Moon ?MRN: [id number] ?Date of Birth: 23-Feb-1956 ?Referring Provider:   ?Flowsheet Row CARDIAC REHAB PHASE II ORIENTATION from 03/12/2022 in [facility]  ?Referring Provider Fae Pippin, MD  ? ?  ? ? ?Initial Encounter Date:  ?Flowsheet Row CARDIAC REHAB PHASE II ORIENTATION from 03/12/2022 in [facility]  ?Date 03/12/22  ? ?  ? ? ?Visit Diagnosis: Stable angina ([place]) ? ?Patient's Home Medications on Admission: ? ?Current Outpatient Medications:  ?  atorvastatin (LIPITOR) 10 MG tablet, Take 10 mg by mouth in the morning., Disp: , Rfl:  ?  diphenoxylate-atropine (LOMOTIL) 2.5-0.025 MG tablet, TAKE  (1) OR (2)  TABLETS EVERY SIX HOURS AS NEEDED, Disp: 60 tablet, Rfl: 2 ?  furosemide (LASIX) 40 MG tablet, TAKE 1 TABLET DAILY, Disp: 90 tablet, Rfl: 3 ?  Lidocaine 4 % PTCH, Apply 1 application topically daily as needed (pain)., Disp: , Rfl:  ?  Multiple Vitamin (MULTIVITAMIN WITH MINERALS) TABS tablet, Take 1 tablet by mouth in the morning., Disp: , Rfl:  ?  nadolol (CORGARD) 20 MG tablet, Take 20 mg by mouth in the morning., Disp: , Rfl:  ?  omeprazole (PRILOSEC) 20 MG capsule, Take 20 mg by mouth daily., Disp: , Rfl:  ?  OVER THE COUNTER MEDICATION, Take 1 tablet by mouth daily as needed (Nausea). altoids mints, Disp: , Rfl:  ?  potassium chloride SA (KLOR-CON) 20 MEQ tablet, TAKE 1 TABLET ONCE DAILY, Disp: 90 tablet, Rfl: 3 ?  promethazine (PHENERGAN) 25 MG tablet, Take 1 tablet (25 mg total) by mouth every 6 (six) hours as needed for nausea or vomiting. (Patient taking differently: Take 12.5 mg by mouth daily as needed for nausea or vomiting.), Disp: 30 tablet, Rfl: 1 ?  rifaximin (XIFAXAN) 550 MG TABS tablet, Take 550 mg by mouth 2 (two) times daily., Disp: , Rfl:  ?  spironolactone (ALDACTONE) 100 MG tablet, TAKE 1 TABLET DAILY, Disp: 90 tablet, Rfl: 3 ? ?Current  Facility-Administered Medications:  ?  sodium chloride flush (NS) 0.9 % injection 3 mL, 3 mL, Intravenous, Q12H, Crenshaw, Denice Bors, MD ? ?Past Medical History: ?Past Medical History:  ?Diagnosis Date  ? Ascites   ? Dysrhythmia   ? hx of SVT  ? Encephalopathy, hepatic 05/13/2018  ? GERD (gastroesophageal reflux disease)   ? History of exercise stress test   ? 03-07-2010  Stress echo--- no arrhythmias or conduction abnormalilites and negative for ischemia or chest pain  ? History of kidney stones   ? History of paroxysmal supraventricular tachycardia   ? episode 2011  consult w/ dr Caryl Comes --  put on atenolol--  per pt no longer an issue  ? HTN (hypertension)   ? cardiologist --  dr Stanford Breed  ? IBS (irritable bowel syndrome)   ? diarrhea  ? Nonalcoholic steatohepatitis (NASH)   ? Numbness and tingling of left lower extremity   ? post achilles tendon repair  ? OA (osteoarthritis)   ? knees  ? Pleural effusion on right   ? hepatic hydrothorax  ? PMB (postmenopausal bleeding) none recent  ? thickened endometrium  ? PONV (postoperative nausea and vomiting)   ? Recurrent UTI none since 2019  ? Secondary esophageal varices without bleeding ([place]) 06/03/2018  ? Vitamin D deficiency   ? Wears glasses   ? ? ?Tobacco Use: ?Social History  ? ?Tobacco Use  ?Smoking Status Never  ?Smokeless Tobacco  Never  ? ? ?Labs: ?Review Flowsheet   ? ?  ?  Latest Ref Rng & Units 01/10/2012 01/11/2012 06/15/2017 08/25/2018  ?Labs for ITP Cardiac and Pulmonary Rehab  ?Cholestrol 0 - 200 mg/dL  168   200   232    ?LDL (calc) 0 - 99 mg/dL  71   112   122    ?Direct LDL mg/dL      ?HDL-C >39.00 mg/dL  56   50   78.80    ?Trlycerides 0.0 - 149.0 mg/dL  205   192   159.0    ?Hemoglobin A1c <5.7 % 5.6       ? ?  09/05/2019  ?Labs for ITP Cardiac and Pulmonary Rehab  ?Cholestrol 252    ?LDL (calc)   ?Direct LDL 66.0    ?HDL-C 54.70    ?Trlycerides 223.0    ?Hemoglobin A1c   ?  ? ? Multiple values from one day are sorted in reverse-chronological order  ?  ?   ? ? ?Capillary Blood Glucose: ?Lab Results  ?Component Value Date  ? GLUCAP 93 06/13/2020  ? ? ? ?Exercise Target Goals: ?Exercise Program Goal: ?Individual exercise prescription set using results from initial 6 min walk test and THRR while considering  patient?s activity barriers and safety.  ? ?Exercise Prescription Goal: ?Starting with aerobic activity 30 plus minutes a day, 3 days per week for initial exercise prescription. Provide home exercise prescription and guidelines that participant acknowledges understanding prior to discharge. ? ?Activity Barriers & Risk Stratification: ? Activity Barriers & Cardiac Risk Stratification - 03/12/22 1121   ? ?  ? Activity Barriers & Cardiac Risk Stratification  ? Activity Barriers Arthritis;Back Problems;Right Knee Replacement;Neck/Spine Problems;Joint Problems;Deconditioning;Muscular Weakness;Balance Concerns   ? Cardiac Risk Stratification High   ? ?  ?  ? ?  ? ? ?6 Minute Walk: ? 6 Minute Walk   ? ? [place] Name 03/12/22 1120  ?  ?  ?  ? 6 Minute Walk  ? Phase Initial    ? Distance 908 feet    ? Walk Time 6 minutes    ? # of Rest Breaks 0    ? MPH 1.72    ? METS 2.01    ? RPE 9    ? Perceived Dyspnea  0    ? VO2 Peak 7.02    ? Symptoms No    ? Resting HR 63 bpm    ? Resting BP 102/72    ? Resting Oxygen Saturation  100 %    ? Exercise Oxygen Saturation  during 6 min walk 100 %    ? Max Ex. HR 76 bpm    ? Max Ex. BP 108/70    ? 2 Minute Post BP 100/74    ? ?  ?  ? ?  ? ? ?Oxygen Initial Assessment: ? ? ?Oxygen Re-Evaluation: ? ? ?Oxygen Discharge (Final Oxygen Re-Evaluation): ? ? ?Initial Exercise Prescription: ? Initial Exercise Prescription - 03/12/22 1100   ? ?  ? Date of Initial Exercise RX and Referring Provider  ? Date 03/12/22   ? Referring Provider Fae Pippin, MD   ? Expected Discharge Date 05/08/22   ?  ? NuStep  ? Level 1   ? SPM 75   ? Minutes 30   ? METs 2   ?  ? Prescription Details  ? Frequency (times per week) 3   ? Duration Progress to 30 minutes of  continuous aerobic without  signs/symptoms of physical distress   ?  ? Intensity  ? THRR 40-80% of Max Heartrate 62-124   ? Ratings of Perceived Exertion 11-13   ? Perceived Dyspnea 0-4   ?  ? Progression  ? Progression Continue progressive overload as per policy without signs/symptoms or physical distress.   ?  ? Resistance Training  ? Training Prescription Yes   ? Weight 2 lbs   ? Reps 10-15   ? ?  ?  ? ?  ? ? ?Perform Capillary Blood Glucose checks as needed. ? ?Exercise Prescription Changes: ? ? ?Exercise Comments: ? ? ?Exercise Goals and Review: ? ? Exercise Goals   ? ? [place] Name 03/12/22 1122  ?  ?  ?  ?  ?  ? Exercise Goals  ? Increase Physical Activity Yes      ? Intervention Provide advice, education, support and counseling about physical activity/exercise needs.;Develop an individualized exercise prescription for aerobic and resistive training based on initial evaluation findings, risk stratification, comorbidities and participant's personal goals.      ? Expected Outcomes Short Term: Attend rehab on a regular basis to increase amount of physical activity.;Long Term: Add in home exercise to make exercise part of routine and to increase amount of physical activity.;Long Term: Exercising regularly at least 3-5 days a week.      ? Increase Strength and Stamina Yes      ? Intervention Provide advice, education, support and counseling about physical activity/exercise needs.;Develop an individualized exercise prescription for aerobic and resistive training based on initial evaluation findings, risk stratification, comorbidities and participant's personal goals.      ? Expected Outcomes Short Term: Increase workloads from initial exercise prescription for resistance, speed, and METs.;Short Term: Perform resistance training exercises routinely during rehab and add in resistance training at home;Long Term: Improve cardiorespiratory fitness, muscular endurance and strength as measured by increased METs and functional  capacity (6MWT)      ? Able to understand and use rate of perceived exertion (RPE) scale Yes      ? Intervention Provide education and explanation on how to use RPE scale      ? Expected Outcomes Short Term: Able to use RP

## 2022-03-12 NOTE — Progress Notes (Deleted)
Cardiac Individual Treatment Plan ? ?Patient Details  ?Name: Dawn Moon ?MRN: [id number] ?Date of Birth: [birth year] ?Referring Provider:   ?Flowsheet Row CARDIAC REHAB PHASE II ORIENTATION from 03/12/2022 in [place]  ?Referring Provider Fae Pippin, MD  ? ?  ? ? ?Initial Encounter Date:  ?Flowsheet Row CARDIAC REHAB PHASE II ORIENTATION from 03/12/2022 in [place]  ?Date 03/12/22  ? ?  ? ? ?Visit Diagnosis: Stable angina ([place]) ? ?Patient's Home Medications on Admission: ? ?Current Outpatient Medications:  ?  atorvastatin (LIPITOR) 10 MG tablet, Take 10 mg by mouth in the morning., Disp: , Rfl:  ?  diphenoxylate-atropine (LOMOTIL) 2.5-0.025 MG tablet, TAKE  (1) OR (2)  TABLETS EVERY SIX HOURS AS NEEDED, Disp: 60 tablet, Rfl: 2 ?  furosemide (LASIX) 40 MG tablet, TAKE 1 TABLET DAILY, Disp: 90 tablet, Rfl: 3 ?  Lidocaine 4 % PTCH, Apply 1 application topically daily as needed (pain)., Disp: , Rfl:  ?  Multiple Vitamin (MULTIVITAMIN WITH MINERALS) TABS tablet, Take 1 tablet by mouth in the morning., Disp: , Rfl:  ?  nadolol (CORGARD) 20 MG tablet, Take 20 mg by mouth in the morning., Disp: , Rfl:  ?  omeprazole (PRILOSEC) 20 MG capsule, Take 20 mg by mouth daily., Disp: , Rfl:  ?  OVER THE COUNTER MEDICATION, Take 1 tablet by mouth daily as needed (Nausea). altoids mints, Disp: , Rfl:  ?  potassium chloride SA (KLOR-CON) 20 MEQ tablet, TAKE 1 TABLET ONCE DAILY, Disp: 90 tablet, Rfl: 3 ?  promethazine (PHENERGAN) 25 MG tablet, Take 1 tablet (25 mg total) by mouth every 6 (six) hours as needed for nausea or vomiting. (Patient taking differently: Take 12.5 mg by mouth daily as needed for nausea or vomiting.), Disp: 30 tablet, Rfl: 1 ?  rifaximin (XIFAXAN) 550 MG TABS tablet, Take 550 mg by mouth 2 (two) times daily., Disp: , Rfl:  ?  spironolactone (ALDACTONE) 100 MG tablet, TAKE 1 TABLET DAILY, Disp: 90 tablet, Rfl: 3 ? ?Current  Facility-Administered Medications:  ?  sodium chloride flush (NS) 0.9 % injection 3 mL, 3 mL, Intravenous, Q12H, Crenshaw, Denice Bors, MD ? ?Past Medical History: ?Past Medical History:  ?Diagnosis Date  ? Ascites   ? Dysrhythmia   ? hx of SVT  ? Encephalopathy, hepatic 05/13/2018  ? GERD (gastroesophageal reflux disease)   ? History of exercise stress test   ? 03-07-2010  Stress echo--- no arrhythmias or conduction abnormalilites and negative for ischemia or chest pain  ? History of kidney stones   ? History of paroxysmal supraventricular tachycardia   ? episode 2011  consult w/ dr Caryl Comes --  put on atenolol--  per pt no longer an issue  ? HTN (hypertension)   ? cardiologist --  dr Stanford Breed  ? IBS (irritable bowel syndrome)   ? diarrhea  ? Nonalcoholic steatohepatitis (NASH)   ? Numbness and tingling of left lower extremity   ? post achilles tendon repair  ? OA (osteoarthritis)   ? knees  ? Pleural effusion on right   ? hepatic hydrothorax  ? PMB (postmenopausal bleeding) none recent  ? thickened endometrium  ? PONV (postoperative nausea and vomiting)   ? Recurrent UTI none since 2019  ? Secondary esophageal varices without bleeding ([place]) 06/03/2018  ? Vitamin D deficiency   ? Wears glasses   ? ? ?Tobacco Use: ?Social History  ? ?Tobacco Use  ?Smoking Status Never  ?Smokeless Tobacco  Never  ? ? ?Labs: ?Review Flowsheet   ? ?  ?  Latest Ref Rng & Units 01/10/2012 01/11/2012 06/15/2017 08/25/2018  ?Labs for ITP Cardiac and Pulmonary Rehab  ?Cholestrol 0 - 200 mg/dL  168   200   232    ?LDL (calc) 0 - 99 mg/dL  71   112   122    ?Direct LDL mg/dL      ?HDL-C >39.00 mg/dL  56   50   78.80    ?Trlycerides 0.0 - 149.0 mg/dL  205   192   159.0    ?Hemoglobin A1c <5.7 % 5.6       ? ?  09/05/2019  ?Labs for ITP Cardiac and Pulmonary Rehab  ?Cholestrol 252    ?LDL (calc)   ?Direct LDL 66.0    ?HDL-C 54.70    ?Trlycerides 223.0    ?Hemoglobin A1c   ?  ? ? Multiple values from one day are sorted in reverse-chronological order  ?  ?   ? ? ?Capillary Blood Glucose: ?Lab Results  ?Component Value Date  ? GLUCAP 93 06/13/2020  ? ? ? ?Exercise Target Goals: ?Exercise Program Goal: ?Individual exercise prescription set using results from initial 6 min walk test and THRR while considering  patient?s activity barriers and safety.  ? ?Exercise Prescription Goal: ?Starting with aerobic activity 30 plus minutes a day, 3 days per week for initial exercise prescription. Provide home exercise prescription and guidelines that participant acknowledges understanding prior to discharge. ? ?Activity Barriers & Risk Stratification: ? Activity Barriers & Cardiac Risk Stratification - 03/12/22 1121   ? ?  ? Activity Barriers & Cardiac Risk Stratification  ? Activity Barriers Arthritis;Back Problems;Right Knee Replacement;Neck/Spine Problems;Joint Problems;Deconditioning;Muscular Weakness;Balance Concerns   ? Cardiac Risk Stratification High   ? ?  ?  ? ?  ? ? ?6 Minute Walk: ? 6 Minute Walk   ? ? [place] Name 03/12/22 1120  ?  ?  ?  ? 6 Minute Walk  ? Phase Initial    ? Distance 908 feet    ? Walk Time 6 minutes    ? # of Rest Breaks 0    ? MPH 1.72    ? METS 2.01    ? RPE 9    ? Perceived Dyspnea  0    ? VO2 Peak 7.02    ? Symptoms No    ? Resting HR 63 bpm    ? Resting BP 102/72    ? Resting Oxygen Saturation  100 %    ? Exercise Oxygen Saturation  during 6 min walk 100 %    ? Max Ex. HR 76 bpm    ? Max Ex. BP 108/70    ? 2 Minute Post BP 100/74    ? ?  ?  ? ?  ? ? ?Oxygen Initial Assessment: ? ? ?Oxygen Re-Evaluation: ? ? ?Oxygen Discharge (Final Oxygen Re-Evaluation): ? ? ?Initial Exercise Prescription: ? Initial Exercise Prescription - 03/12/22 1100   ? ?  ? Date of Initial Exercise RX and Referring Provider  ? Date 03/12/22   ? Referring Provider Fae Pippin, MD   ? Expected Discharge Date 05/08/22   ?  ? NuStep  ? Level 1   ? SPM 75   ? Minutes 30   ? METs 2   ?  ? Prescription Details  ? Frequency (times per week) 3   ? Duration Progress to 30 minutes of  continuous aerobic without  signs/symptoms of physical distress   ?  ? Intensity  ? THRR 40-80% of Max Heartrate 62-124   ? Ratings of Perceived Exertion 11-13   ? Perceived Dyspnea 0-4   ?  ? Progression  ? Progression Continue progressive overload as per policy without signs/symptoms or physical distress.   ?  ? Resistance Training  ? Training Prescription Yes   ? Weight 2 lbs   ? Reps 10-15   ? ?  ?  ? ?  ? ? ?Perform Capillary Blood Glucose checks as needed. ? ?Exercise Prescription Changes: ? ? ?Exercise Comments: ? ? ?Exercise Goals and Review: ? ? Exercise Goals   ? ? [place] Name 03/12/22 1122  ?  ?  ?  ?  ?  ? Exercise Goals  ? Increase Physical Activity Yes      ? Intervention Provide advice, education, support and counseling about physical activity/exercise needs.;Develop an individualized exercise prescription for aerobic and resistive training based on initial evaluation findings, risk stratification, comorbidities and participant's personal goals.      ? Expected Outcomes Short Term: Attend rehab on a regular basis to increase amount of physical activity.;Long Term: Add in home exercise to make exercise part of routine and to increase amount of physical activity.;Long Term: Exercising regularly at least 3-5 days a week.      ? Increase Strength and Stamina Yes      ? Intervention Provide advice, education, support and counseling about physical activity/exercise needs.;Develop an individualized exercise prescription for aerobic and resistive training based on initial evaluation findings, risk stratification, comorbidities and participant's personal goals.      ? Expected Outcomes Short Term: Increase workloads from initial exercise prescription for resistance, speed, and METs.;Short Term: Perform resistance training exercises routinely during rehab and add in resistance training at home;Long Term: Improve cardiorespiratory fitness, muscular endurance and strength as measured by increased METs and functional  capacity (6MWT)      ? Able to understand and use rate of perceived exertion (RPE) scale Yes      ? Intervention Provide education and explanation on how to use RPE scale      ? Expected Outcomes Short Term: Able to use RP

## 2022-03-16 ENCOUNTER — Encounter (HOSPITAL_COMMUNITY)
Admission: RE | Admit: 2022-03-16 | Discharge: 2022-03-16 | Disposition: A | Discharge status: Home / Self Care | Payer: Medicare Other | Source: Ambulatory Visit | Attending: Cardiology | Admitting: Cardiology

## 2022-03-16 ENCOUNTER — Other Ambulatory Visit: Payer: Self-pay

## 2022-03-16 DIAGNOSIS — I208 Other forms of angina pectoris: Secondary | ICD-10-CM

## 2022-03-16 NOTE — Progress Notes (Addendum)
Daily Session Note ? ?Patient Details  ?Name: Dawn Moon ?MRN: [id number] ?Date of Birth: 1956-05-14 ?Referring Provider:   ?Flowsheet Row CARDIAC REHAB PHASE II ORIENTATION from 03/12/2022 in [facility] [place]  ?Referring Provider Dawn Pippin, MD  ? ?  ? ? ?Encounter Date: 03/16/2022 ? ?Check In: ? Session Check In - 03/16/22 1250   ? ?  ? Check-In  ? Supervising physician immediately available to respond to emergencies [facility] immediately available   ? Physician(s) Dr. Pietro Cassis   ? Location [facility]   ? Staff Present Barnet Pall, RN, Milus Glazier, MS, ACSM-CEP, CCRP, Exercise Physiologist;Olinty Celesta Aver, MS, ACSM CEP, Exercise Physiologist;Annedrea Rosezella [place], RN, [place]   ? Virtual Visit No   ? Medication changes reported     No   ? Fall or balance concerns reported    No   ? Tobacco Cessation No Change   ? Current number of cigarettes/nicotine per day     0   ? Warm-up and Cool-down Performed as group-led instruction   ? Resistance Training Performed Yes   ? VAD Patient? No   ? PAD/SET Patient? No   ?  ? Pain Assessment  ? Currently in Pain? No/denies   ? Pain Score 0-No pain   ? Multiple Pain Sites No   ? ?  ?  ? ?  ? ? ?Capillary Blood Glucose: ?No results found for this or any previous visit (from the past 24 hour(s)). ? ? Exercise Prescription Changes - 03/16/22 1400   ? ?  ? Response to Exercise  ? Blood Pressure (Admit) 102/60   ? Blood Pressure (Exercise) 106/70   ? Blood Pressure (Exit) 102/70   ? Heart Rate (Admit) 70 bpm   ? Heart Rate (Exercise) 79 bpm   ? Heart Rate (Exit) 72 bpm   ? Rating of Perceived Exertion (Exercise) 10   ? Symptoms None   ? Comments Pt's first day in the CRP2 program   ? Duration Continue with 30 min of aerobic exercise without signs/symptoms of physical distress.   ? Intensity THRR unchanged   ?  ? Progression  ? Progression Continue to progress workloads to maintain intensity without signs/symptoms of  physical distress.   ? Average METs 1.5   ?  ? Resistance Training  ? Training Prescription Yes   ? Weight 1 lb   ? Reps 10-15   ? Time 10 Minutes   ?  ? Interval Training  ? Interval Training No   ?  ? NuStep  ? Level 1   ? SPM 54   ? Minutes 30   ? METs 1.5   ? ?  ?  ? ?  ? ? ?Social History  ? ?Tobacco Use  ?Smoking Status Never  ?Smokeless Tobacco Never  ? ? ?Goals Met:  ?Exercise tolerated well ? ?Goals Unmet:  ?Not Applicable ? ?Comments: Dawn Moon started cardiac rehab today.  Pt tolerated light exercise without difficulty.Dawn Moon is somewhat deconditioned VSS, telemetry-Sinus Rhythm, asymptomatic.  Medication list reconciled. Pt denies barriers to medicaiton compliance.  PSYCHOSOCIAL ASSESSMENT:  PHQ-0. Pt exhibits positive coping skills, hopeful outlook with supportive family. No psychosocial needs identified at this time, no psychosocial interventions necessary.    Pt enjoys photography and travel.   Pt oriented to exercise equipment and routine. QUALITY OF LIFE SCORE REVIEW ? Pt completed Quality of Life survey as a participant in Cardiac Rehab.  Scores 21.0 or below are considered low.  Pt score very low in several areas Overall 23.26, Health and Function 19.70, socioeconomic 25.71, physiological and spiritual 23.64, family 30.0. Patient quality of life slightly altered by physical constraints which limits ability to perform as prior to recent cardiac illness. Dawn Moon admits to being dissatisfied with her health due to her liver disease.  Offered emotional support and reassurance.  Will continue to monitor and intervene as necessary. Dawn Moon has good family support and denies being depressed   Understanding verbalized. Barnet Pall, RN,BSN ?03/17/2022 7:59 AM  ? ? ?Dr. Fransico Him is Medical Director for Cardiac Rehab at [facility]. ?

## 2022-03-18 ENCOUNTER — Encounter (HOSPITAL_COMMUNITY)
Admission: RE | Admit: 2022-03-18 | Discharge: 2022-03-18 | Disposition: A | Discharge status: Home / Self Care | Payer: Medicare Other | Source: Ambulatory Visit | Attending: Cardiology | Admitting: Cardiology

## 2022-03-18 DIAGNOSIS — I208 Other forms of angina pectoris: Secondary | ICD-10-CM | POA: Diagnosis not present

## 2022-03-20 ENCOUNTER — Encounter (HOSPITAL_COMMUNITY): Payer: Medicare Other

## 2022-03-23 ENCOUNTER — Encounter (HOSPITAL_COMMUNITY)
Admission: RE | Admit: 2022-03-23 | Discharge: 2022-03-23 | Disposition: A | Discharge status: Home / Self Care | Payer: Medicare Other | Source: Ambulatory Visit | Attending: Cardiology | Admitting: Cardiology

## 2022-03-23 DIAGNOSIS — I208 Other forms of angina pectoris: Secondary | ICD-10-CM | POA: Insufficient documentation

## 2022-03-25 ENCOUNTER — Encounter (HOSPITAL_COMMUNITY)
Admission: RE | Admit: 2022-03-25 | Discharge: 2022-03-25 | Disposition: A | Discharge status: Home / Self Care | Payer: Medicare Other | Source: Ambulatory Visit | Attending: Cardiology | Admitting: Cardiology

## 2022-03-25 DIAGNOSIS — I208 Other forms of angina pectoris: Secondary | ICD-10-CM

## 2022-03-25 DIAGNOSIS — I2089 Other forms of angina pectoris: Secondary | ICD-10-CM

## 2022-03-25 NOTE — Progress Notes (Signed)
Dawn Moon's weight is 74.2 kg today. Dawn Moon's weight at orientation was 72.6 kg.Which is up 1.6 kg. Dawn Moon feels like she is retaining fluid in her abdomen and reported having pain in her left side yesterday. Oxygen saturation 99% on room air. Upon assessment lung fields diminished right posterior base. Abdomen distended and soft upon palpation. Bilateral lower extremity edema greater on the right which appears to be chronic. Dawn Moon denies having any increased shortness of breath than usual. Vin Bhagat PAC paged and notified. Vin said that Dawn Moon may continue exercise at cardiac rehab. Patient instructed to call the office if she has an increase in shortness of breath. Dawn Moon reported experiencing pain in her right side during exercise today 4/10. Exercise stopped after 15 minutes on the nustep. Patient's primary provider called and notified appointment made for Dawn Moon to be evaluated tomorrow at 0900. Exit heart rate 82. Blood pressure 104/68. Dawn Moon said that the pain in her abdomen is a 3/10. Dawn Moon to go to the ED if her symptoms increase. Patient states understanding.Barnet Pall, RN,BSN ?03/25/2022 1:43 PM  ? ? ? ? ? ?

## 2022-03-26 ENCOUNTER — Telehealth (HOSPITAL_COMMUNITY): Payer: Self-pay | Admitting: *Deleted

## 2022-03-26 NOTE — Telephone Encounter (Signed)
Dawn Moon called to report that she saw her primary care provider and was told that she has a pleural effusion and is waiting to get an ultrasound. Dawn Moon said that she will not be able to attend cardiac rehab tomorrow. Will cancel appointment.Barnet Pall, RN,BSN ?03/26/2022 1:45 PM   ?

## 2022-03-27 ENCOUNTER — Encounter (HOSPITAL_COMMUNITY): Payer: Medicare Other

## 2022-03-30 ENCOUNTER — Encounter (HOSPITAL_COMMUNITY)
Admission: RE | Admit: 2022-03-30 | Discharge: 2022-03-30 | Disposition: A | Discharge status: Home / Self Care | Payer: Medicare Other | Source: Ambulatory Visit | Attending: Cardiology | Admitting: Cardiology

## 2022-03-30 DIAGNOSIS — I208 Other forms of angina pectoris: Secondary | ICD-10-CM | POA: Diagnosis not present

## 2022-03-30 NOTE — Progress Notes (Signed)
Dawn Moon 66 y.o. female ? ?Nutrition Note ?[name] is motivated to make lifestyle changes to aid with cardiac rehab. Patient has medical history of HTN, CAD, IBS, GERD, Liver cirrhosis secondary to NASH, hepatic encephalopathy, hyperlipidemia, anemia. She is on the liver transplant list. She lives at home with her husband; she does the majority of the grocery shopping and cooking. She has made many lifestyle changes including eliminated red meat with significant fish intake,  increased fruit and vegetable intake, decreased sodium intake, choosing whole grains,etc. She has previously seen nutrition in October in preparation for liver transplant. She is interested in easy to prepare recipe ideas or crockpot ideas.  ? ?Nutrition Diagnosis ?Overweight related to excessive energy intake as evidenced by BMI of 28. 3 ? ?Nutrition Intervention ?Pt?s individual nutrition plan reviewed with pt. ?Benefits of adopting Heart Healthy diet discussed.  ?Continue client-centered nutrition education by RD, as part of interdisciplinary care. ? ?Monitor/Evaluation: ?Patient reports motivation to make lifestyle changes for adherence to heart healthy diet recommendation. We reviewed her current eating habits/behaviors; she reports significant fruit/vegetable, whole grains, and fish intake. She is has reduced/eliminated added salt and red meat.Encouraged her to continue with commitment to dietary changes. Will give recipes to support these changes. Patient amicable to RD suggestions and verbalizes understanding. Will follow-up as needed.  ? ?10 minutes spent in review of topics related to a heart healthy diet including sodium intake, blood sugar control, weight management, and fiber intake. ? ?Goal(s) ?Pt to identify and limit food sources of saturated fat, trans fat, refined carbohydrates and sodium ?Pt to identify food quantities necessary to achieve weight loss ?Pt to describe the benefit of including lean protein/plant  proteins, fruits, vegetables, whole grains, nuts/seeds, and low-fat dairy products in a heart healthy meal plan. ?Pt to practice mindful and intuitive eating exercises ? ?Plan:  ?Pt to attend nutrition classes  ?Will provide client-centered nutrition education as part of interdisciplinary care ?Monitor and evaluate progress toward nutrition goal with team. ? ? ?Aldona [place], MS, RDN, LDN ? ?

## 2022-03-31 NOTE — Progress Notes (Signed)
Cardiac Individual Treatment Plan ? ?Patient Details  ?Name: Dawn Moon ?MRN: [id number] ?Date of Birth: 1956/11/27 ?Referring Provider:   ?Flowsheet Row CARDIAC REHAB PHASE II ORIENTATION from 03/12/2022 in [place]  ?Referring Provider Fae Pippin, MD  ? ?  ? ? ?Initial Encounter Date:  ?Flowsheet Row CARDIAC REHAB PHASE II ORIENTATION from 03/12/2022 in [place]  ?Date 03/12/22  ? ?  ? ? ?Visit Diagnosis: Stable angina ([place]) ? ?Patient's Home Medications on Admission: ? ?Current Outpatient Medications:  ?  atorvastatin (LIPITOR) 10 MG tablet, Take 10 mg by mouth in the morning., Disp: , Rfl:  ?  diphenoxylate-atropine (LOMOTIL) 2.5-0.025 MG tablet, TAKE  (1) OR (2)  TABLETS EVERY SIX HOURS AS NEEDED, Disp: 60 tablet, Rfl: 2 ?  furosemide (LASIX) 40 MG tablet, TAKE 1 TABLET DAILY, Disp: 90 tablet, Rfl: 3 ?  Lidocaine 4 % PTCH, Apply 1 application topically daily as needed (pain)., Disp: , Rfl:  ?  Multiple Vitamin (MULTIVITAMIN WITH MINERALS) TABS tablet, Take 1 tablet by mouth in the morning., Disp: , Rfl:  ?  nadolol (CORGARD) 20 MG tablet, Take 20 mg by mouth in the morning., Disp: , Rfl:  ?  omeprazole (PRILOSEC) 20 MG capsule, Take 20 mg by mouth daily., Disp: , Rfl:  ?  OVER THE COUNTER MEDICATION, Take 1 tablet by mouth daily as needed (Nausea). altoids mints, Disp: , Rfl:  ?  potassium chloride SA (KLOR-CON) 20 MEQ tablet, TAKE 1 TABLET ONCE DAILY, Disp: 90 tablet, Rfl: 3 ?  promethazine (PHENERGAN) 25 MG tablet, Take 1 tablet (25 mg total) by mouth every 6 (six) hours as needed for nausea or vomiting. (Patient taking differently: Take 12.5 mg by mouth daily as needed for nausea or vomiting.), Disp: 30 tablet, Rfl: 1 ?  rifaximin (XIFAXAN) 550 MG TABS tablet, Take 550 mg by mouth 2 (two) times daily., Disp: , Rfl:  ?  spironolactone (ALDACTONE) 100 MG tablet, TAKE 1 TABLET DAILY, Disp: 90 tablet, Rfl: 3 ? ?Current  Facility-Administered Medications:  ?  sodium chloride flush (NS) 0.9 % injection 3 mL, 3 mL, Intravenous, Q12H, Crenshaw, Denice Bors, MD ? ?Past Medical History: ?Past Medical History:  ?Diagnosis Date  ? Ascites   ? Dysrhythmia   ? hx of SVT  ? Encephalopathy, hepatic 05/13/2018  ? GERD (gastroesophageal reflux disease)   ? History of exercise stress test   ? 03-07-2010  Stress echo--- no arrhythmias or conduction abnormalilites and negative for ischemia or chest pain  ? History of kidney stones   ? History of paroxysmal supraventricular tachycardia   ? episode 2011  consult w/ dr Caryl Comes --  put on atenolol--  per pt no longer an issue  ? HTN (hypertension)   ? cardiologist --  dr Stanford Breed  ? IBS (irritable bowel syndrome)   ? diarrhea  ? Nonalcoholic steatohepatitis (NASH)   ? Numbness and tingling of left lower extremity   ? post achilles tendon repair  ? OA (osteoarthritis)   ? knees  ? Pleural effusion on right   ? hepatic hydrothorax  ? PMB (postmenopausal bleeding) none recent  ? thickened endometrium  ? PONV (postoperative nausea and vomiting)   ? Recurrent UTI none since 2019  ? Secondary esophageal varices without bleeding ([place]) 06/03/2018  ? Vitamin D deficiency   ? Wears glasses   ? ? ?Tobacco Use: ?Social History  ? ?Tobacco Use  ?Smoking Status Never  ?Smokeless Tobacco  Never  ? ? ?Labs: ?Review Flowsheet   ? ?  ?  Latest Ref Rng & Units 01/10/2012 01/11/2012 06/15/2017 08/25/2018  ?Labs for ITP Cardiac and Pulmonary Rehab  ?Cholestrol 0 - 200 mg/dL  168   200   232    ?LDL (calc) 0 - 99 mg/dL  71   112   122    ?Direct LDL mg/dL      ?HDL-C >39.00 mg/dL  56   50   78.80    ?Trlycerides 0.0 - 149.0 mg/dL  205   192   159.0    ?Hemoglobin A1c <5.7 % 5.6       ? ?  09/05/2019  ?Labs for ITP Cardiac and Pulmonary Rehab  ?Cholestrol 252    ?LDL (calc)   ?Direct LDL 66.0    ?HDL-C 54.70    ?Trlycerides 223.0    ?Hemoglobin A1c   ?  ? ? Multiple values from one day are sorted in reverse-chronological order  ?  ?   ? ? ?Capillary Blood Glucose: ?Lab Results  ?Component Value Date  ? GLUCAP 93 06/13/2020  ? ? ? ?Exercise Target Goals: ?Exercise Program Goal: ?Individual exercise prescription set using results from initial 6 min walk test and THRR while considering  patient?s activity barriers and safety.  ? ?Exercise Prescription Goal: ?Starting with aerobic activity 30 plus minutes a day, 3 days per week for initial exercise prescription. Provide home exercise prescription and guidelines that participant acknowledges understanding prior to discharge. ? ?Activity Barriers & Risk Stratification: ? Activity Barriers & Cardiac Risk Stratification - 03/12/22 1121   ? ?  ? Activity Barriers & Cardiac Risk Stratification  ? Activity Barriers Arthritis;Back Problems;Right Knee Replacement;Neck/Spine Problems;Joint Problems;Deconditioning;Muscular Weakness;Balance Concerns   ? Cardiac Risk Stratification High   ? ?  ?  ? ?  ? ? ?6 Minute Walk: ? 6 Minute Walk   ? ? [place] Name 03/12/22 1120  ?  ?  ?  ? 6 Minute Walk  ? Phase Initial    ? Distance 908 feet    ? Walk Time 6 minutes    ? # of Rest Breaks 0    ? MPH 1.72    ? METS 2.01    ? RPE 9    ? Perceived Dyspnea  0    ? VO2 Peak 7.02    ? Symptoms No    ? Resting HR 63 bpm    ? Resting BP 102/72    ? Resting Oxygen Saturation  100 %    ? Exercise Oxygen Saturation  during 6 min walk 100 %    ? Max Ex. HR 76 bpm    ? Max Ex. BP 108/70    ? 2 Minute Post BP 100/74    ? ?  ?  ? ?  ? ? ?Oxygen Initial Assessment: ? ? ?Oxygen Re-Evaluation: ? ? ?Oxygen Discharge (Final Oxygen Re-Evaluation): ? ? ?Initial Exercise Prescription: ? Initial Exercise Prescription - 03/12/22 1100   ? ?  ? Date of Initial Exercise RX and Referring Provider  ? Date 03/12/22   ? Referring Provider Fae Pippin, MD   ? Expected Discharge Date 05/08/22   ?  ? NuStep  ? Level 1   ? SPM 75   ? Minutes 30   ? METs 2   ?  ? Prescription Details  ? Frequency (times per week) 3   ? Duration Progress to 30 minutes of  continuous aerobic without  signs/symptoms of physical distress   ?  ? Intensity  ? THRR 40-80% of Max Heartrate 62-124   ? Ratings of Perceived Exertion 11-13   ? Perceived Dyspnea 0-4   ?  ? Progression  ? Progression Continue progressive overload as per policy without signs/symptoms or physical distress.   ?  ? Resistance Training  ? Training Prescription Yes   ? Weight 2 lbs   ? Reps 10-15   ? ?  ?  ? ?  ? ? ?Perform Capillary Blood Glucose checks as needed. ? ?Exercise Prescription Changes: ? ? Exercise Prescription Changes   ? ? [place] Name 03/16/22 1400 03/30/22 1500  ?  ?  ?  ?  ? Response to Exercise  ? Blood Pressure (Admit) 102/60 106/71     ? Blood Pressure (Exercise) 106/70 128/86     ? Blood Pressure (Exit) 102/70 114/74     ? Heart Rate (Admit) 70 bpm 72 bpm     ? Heart Rate (Exercise) 79 bpm 79 bpm     ? Heart Rate (Exit) 72 bpm 72 bpm     ? Rating of Perceived Exertion (Exercise) 10 12     ? Symptoms None None     ? Comments Pt's first day in the CRP2 program Reviewed METs     ? Duration Continue with 30 min of aerobic exercise without signs/symptoms of physical distress. Progress to 30 minutes of  aerobic without signs/symptoms of physical distress     ? Intensity THRR unchanged THRR unchanged     ?  ? Progression  ? Progression Continue to progress workloads to maintain intensity without signs/symptoms of physical distress. Continue to progress workloads to maintain intensity without signs/symptoms of physical distress.     ? Average METs 1.5 1.7     ?  ? Resistance Training  ? Training Prescription Yes Yes     ? Weight 1 lb 1 lb     ? Reps 10-15 10-15     ? Time 10 Minutes 10 Minutes     ?  ? Interval Training  ? Interval Training No No     ?  ? NuStep  ? Level 1 1     ? SPM 54 --     ? Minutes 30 30     ? METs 1.5 1.7     ? ?  ?  ? ?  ? ? ?Exercise Comments: ? ? Exercise Comments   ? ? [place] Name 03/16/22 1427 03/30/22 1554  ?  ?  ?  ? Exercise Comments Pt's first day in the CRP2 program. Pt had no  complaints with this low level exercise session. Reviewed METs. Pt has completed 5 sessions. slow progression.     ? ?  ?  ? ?  ? ? ?Exercise Goals and Review: ? ? Exercise Goals   ? ? [place] Name 03/12/22 1122  ?  ?  ?  ?  ?

## 2022-04-01 ENCOUNTER — Encounter (HOSPITAL_COMMUNITY)
Admission: RE | Admit: 2022-04-01 | Discharge: 2022-04-01 | Disposition: A | Discharge status: Home / Self Care | Payer: Medicare Other | Source: Ambulatory Visit | Attending: Cardiology | Admitting: Cardiology

## 2022-04-01 DIAGNOSIS — I208 Other forms of angina pectoris: Secondary | ICD-10-CM | POA: Diagnosis not present

## 2022-04-03 ENCOUNTER — Encounter (HOSPITAL_COMMUNITY)
Admission: RE | Admit: 2022-04-03 | Discharge: 2022-04-03 | Disposition: A | Discharge status: Home / Self Care | Payer: Medicare Other | Source: Ambulatory Visit | Attending: Cardiology | Admitting: Cardiology

## 2022-04-03 DIAGNOSIS — I208 Other forms of angina pectoris: Secondary | ICD-10-CM | POA: Diagnosis not present

## 2022-04-06 ENCOUNTER — Encounter (HOSPITAL_COMMUNITY)
Admission: RE | Admit: 2022-04-06 | Discharge: 2022-04-06 | Disposition: A | Discharge status: Home / Self Care | Payer: Medicare Other | Source: Ambulatory Visit | Attending: Cardiology | Admitting: Cardiology

## 2022-04-06 DIAGNOSIS — I208 Other forms of angina pectoris: Secondary | ICD-10-CM | POA: Diagnosis not present

## 2022-04-08 ENCOUNTER — Telehealth (HOSPITAL_COMMUNITY): Payer: Self-pay | Admitting: Family Medicine

## 2022-04-08 ENCOUNTER — Encounter (HOSPITAL_COMMUNITY): Payer: Medicare Other

## 2022-04-08 NOTE — Progress Notes (Signed)
? ? ? ? ?HPI: FU CAD, hypertension and volume excess. CardioNet 2011 showed 4 beats of nonsustained ventricular tachycardia; also brief atrial tachycardia; note some of symptoms also noted to be sinus tachycardia. Patient was seen by Dr. Caryl Comes and started on verapamil. Her symptoms did not improve and she was changed to a beta blocker. Her symptoms improved with atenolol. Patient admitted April 2019 and underwent right knee replacement.  Following discharge she developed worsening dyspnea. CTA May 2019 showed no pulmonary embolus.  There was a large right pleural effusion and lobulated liver compatible with cirrhosis. She was diagnosed with cirrhosis due to NASH. She has been followed by gastroenterology as an outpatient.  She was referred to [facility] for consideration of transplant. EGD March 2021 showed esophageal varices.  CTA September 2021 showed 5 mm left upper lobe pulmonary nodule and follow-up recommended 12 months if pt high risk, cirrhotic liver with portal venous hypertension including perigastric and paraesophageal varices and cystic lesion in the right adnexal space.  Echocardiogram at [facility] October 2022 showed normal LV function and late positive microcavitation study consistent with extracardiac shunting.  Stress echocardiogram October 2022 suggested wall motion abnormality in the inferior wall.  Cardiac CTA at [facility] November 2022 showed calcium score 53.9, 70 to 90% in the proximal to mid LAD with 30 to 50% mid to distal LAD, less than 30% first diagonal and small pericardial effusion.  FFR suggested significant stenosis in the distal LAD.  Cardiac catheterization December 2022 showed 60% distal LAD.  Medical therapy recommended.  Since last seen, she has some dyspnea with more vigorous activities.  No orthopnea, PND, pedal edema, chest pain or syncope. ? ?Current Outpatient Medications  ?Medication Sig Dispense Refill  ? diphenoxylate-atropine (LOMOTIL) 2.5-0.025 MG tablet TAKE  (1) OR (2)   TABLETS EVERY SIX HOURS AS NEEDED 60 tablet 2  ? furosemide (LASIX) 40 MG tablet TAKE 1 TABLET DAILY 90 tablet 3  ? Lidocaine 4 % PTCH Apply 1 application topically daily as needed (pain).    ? Multiple Vitamin (MULTIVITAMIN WITH MINERALS) TABS tablet Take 1 tablet by mouth in the morning.    ? nadolol (CORGARD) 20 MG tablet Take 20 mg by mouth in the morning.    ? omeprazole (PRILOSEC) 20 MG capsule Take 20 mg by mouth daily.    ? OVER THE COUNTER MEDICATION Take 1 tablet by mouth daily as needed (Nausea). altoids mints    ? potassium chloride SA (KLOR-CON) 20 MEQ tablet TAKE 1 TABLET ONCE DAILY 90 tablet 3  ? promethazine (PHENERGAN) 25 MG tablet Take 1 tablet (25 mg total) by mouth every 6 (six) hours as needed for nausea or vomiting. (Patient taking differently: Take 12.5 mg by mouth daily as needed for nausea or vomiting.) 30 tablet 1  ? rifaximin (XIFAXAN) 550 MG TABS tablet Take 550 mg by mouth 2 (two) times daily.    ? spironolactone (ALDACTONE) 100 MG tablet TAKE 1 TABLET DAILY 90 tablet 3  ? ?Current Facility-Administered Medications  ?Medication Dose Route Frequency Provider Last Rate Last Admin  ? sodium chloride flush (NS) 0.9 % injection 3 mL  3 mL Intravenous Q12H Lelon Perla, MD      ? ? ? ?Past Medical History:  ?Diagnosis Date  ? Ascites   ? Dysrhythmia   ? hx of SVT  ? Encephalopathy, hepatic ([place]) 05/13/2018  ? GERD (gastroesophageal reflux disease)   ? History of exercise stress test   ? 03-07-2010  Stress echo---  no arrhythmias or conduction abnormalilites and negative for ischemia or chest pain  ? History of kidney stones   ? History of paroxysmal supraventricular tachycardia   ? episode 2011  consult w/ dr Caryl Comes --  put on atenolol--  per pt no longer an issue  ? HTN (hypertension)   ? cardiologist --  dr Stanford Breed  ? IBS (irritable bowel syndrome)   ? diarrhea  ? Nonalcoholic steatohepatitis (NASH)   ? Numbness and tingling of left lower extremity   ? post achilles tendon repair  ? OA  (osteoarthritis)   ? knees  ? Pleural effusion on right   ? hepatic hydrothorax  ? PMB (postmenopausal bleeding) none recent  ? thickened endometrium  ? PONV (postoperative nausea and vomiting)   ? Recurrent UTI none since 2019  ? Secondary esophageal varices without bleeding ([place]) 06/03/2018  ? Vitamin D deficiency   ? Wears glasses   ? ? ?Past Surgical History:  ?Procedure Laterality Date  ? ACHILLES TENDON SURGERY Left 06/2016  ? BIOPSY  11/25/2021  ? Procedure: BIOPSY;  Surgeon: Gatha Mayer, MD;  Location: [facility];  Service: Endoscopy;;  ? BREAST BIOPSY Left 02/2016  ? benign  ? CARDIAC CATHETERIZATION    ? COLONOSCOPY  2005  ? COLONOSCOPY WITH PROPOFOL N/A 11/25/2021  ? Procedure: COLONOSCOPY WITH PROPOFOL;  Surgeon: Gatha Mayer, MD;  Location: [facility];  Service: Endoscopy;  Laterality: N/A;  ? CT CTA CORONARY W/CA SCORE W/CM &/OR WO/CM  01/01/2014  ? non-obstructive calcified plaque in pLAD (0-25%), no significant incidental noncardiac findings noted  ? DILATATION & CURETTAGE/HYSTEROSCOPY WITH MYOSURE N/A 12/02/2020  ? Procedure: DILATATION & CURETTAGE/HYSTEROSCOPY WITH POLYPECTOMY AND REMOVAL OF VAGINAL LESION;  Surgeon: [name], MD;  Location: [place];  Service: Gynecology;  Laterality: N/A;  request to follow in [place] held (DR. Delilah Shan has two other cases that morning starting 7:30am)  ? ESOPHAGOGASTRODUODENOSCOPY  01/2014  ? EXTRACORPOREAL SHOCK WAVE LITHOTRIPSY  2010  ? KNEE ARTHROPLASTY Right 03/24/2018  ? Procedure: RIGHT TOTAL KNEE ARTHROPLASTY WITH COMPUTER NAVIGATION;  Surgeon: Rod Can, MD;  Location: [facility];  Service: Orthopedics;  Laterality: Right;  Needs RNFA  ? KNEE ARTHROSCOPY Right 12/04/2016  ? Procedure: ARTHROSCOPY KNEE WITH PARTIAL MEDIAL MENISCECTOMY;  Surgeon: Rod Can, MD;  Location: [place];  Service: Orthopedics;  Laterality: Right;  ? LEFT HEART CATH AND CORONARY ANGIOGRAPHY N/A 11/20/2021   ? Procedure: LEFT HEART CATH AND CORONARY ANGIOGRAPHY;  Surgeon: Burnell Blanks, MD;  Location: [place] [facility];  Service: Cardiovascular;  Laterality: N/A;  ? LEFT HEART CATHETERIZATION WITH CORONARY ANGIOGRAM N/A 01/11/2012  ? Procedure: LEFT HEART CATHETERIZATION WITH CORONARY ANGIOGRAM;  Surgeon: Sherren Mocha, MD;  Location: [facility] C[facility];  Service: Cardiovascular;  Laterality: N/A;  widely patent coronary arterires without significant obstructive CAD,  normal LVF, ef 55-56%  ? TONSILLECTOMY AND ADENOIDECTOMY  child  ? TRANSTHORACIC ECHOCARDIOGRAM  01/18/2012  ? mild LVH,  ef 60%/  trivial TR  ? TUBAL LIGATION  1985  ? UPPER GASTROINTESTINAL ENDOSCOPY  last done 2019  ? ? ?Social History  ? ?Socioeconomic History  ? Marital status: Married  ?  Spouse name: Not on file  ? Number of children: 3  ? Years of education: 5  ? Highest education level: Bachelor's degree (e.g., BA, AB, BS)  ?Occupational History  ? Occupation: inpatient case manager  ?  Employer: [employer]  ?Tobacco Use  ? Smoking  status: Never  ? Smokeless tobacco: Never  ?Vaping Use  ? Vaping Use: Never used  ?Substance and Sexual Activity  ? Alcohol use: Not Currently  ? Drug use: No  ? Sexual activity: Yes  ?  Birth control/protection: Post-menopausal  ?  Comment: 1st intercourse 66 yo-Fewer than 5 partners  ?Other Topics Concern  ? Not on file  ?Social History Narrative  ? 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 Has daughters - one is [place] EP NP ([place])   ? rare EtOH, never smoker, no drugs  ? Does not routinely exercise.  Has to walk up stairs to work and can do w/o Ss.  Lives @ home in [place] with family.  ? ?Social Determinants of Health  ? ?Financial Resource Strain: Not on file  ?Food Insecurity: Not on file  ?Transportation Needs: Not on file  ?Physical Activity: Not on file  ?Stress: Not on file  ?Social Connections: Not on file  ?Intimate Partner Violence: Not on file  ? ? ?Family History   ?Problem Relation Age of Onset  ? Lymphoma Mother   ?     non-hodgkins - died @ 21  ? Coronary artery disease Mother   ? Coronary artery disease Father   ?     CABG in his 97s, died @ 58  ? Diabetes Father

## 2022-04-10 ENCOUNTER — Encounter (HOSPITAL_COMMUNITY)
Admission: RE | Admit: 2022-04-10 | Discharge: 2022-04-10 | Disposition: A | Discharge status: Home / Self Care | Payer: Medicare Other | Source: Ambulatory Visit | Attending: Cardiology | Admitting: Cardiology

## 2022-04-10 DIAGNOSIS — I208 Other forms of angina pectoris: Secondary | ICD-10-CM

## 2022-04-13 ENCOUNTER — Encounter: Payer: Self-pay | Admitting: Cardiology

## 2022-04-13 ENCOUNTER — Encounter (HOSPITAL_COMMUNITY)
Admission: RE | Admit: 2022-04-13 | Discharge: 2022-04-13 | Disposition: A | Discharge status: Home / Self Care | Payer: Medicare Other | Source: Ambulatory Visit | Attending: Cardiology | Admitting: Cardiology

## 2022-04-13 ENCOUNTER — Ambulatory Visit (INDEPENDENT_AMBULATORY_CARE_PROVIDER_SITE_OTHER): Payer: Medicare Other | Admitting: Cardiology

## 2022-04-13 VITALS — BP 118/68 | HR 68 | Ht 64.0 in | Wt 155.0 lb

## 2022-04-13 DIAGNOSIS — I10 Essential (primary) hypertension: Secondary | ICD-10-CM | POA: Diagnosis not present

## 2022-04-13 DIAGNOSIS — I208 Other forms of angina pectoris: Secondary | ICD-10-CM | POA: Diagnosis not present

## 2022-04-13 DIAGNOSIS — E8770 Fluid overload, unspecified: Secondary | ICD-10-CM

## 2022-04-13 DIAGNOSIS — R002 Palpitations: Secondary | ICD-10-CM | POA: Diagnosis not present

## 2022-04-13 DIAGNOSIS — I2510 Atherosclerotic heart disease of native coronary artery without angina pectoris: Secondary | ICD-10-CM | POA: Diagnosis not present

## 2022-04-13 NOTE — Patient Instructions (Signed)

## 2022-04-15 ENCOUNTER — Encounter (HOSPITAL_COMMUNITY)
Admission: RE | Admit: 2022-04-15 | Discharge: 2022-04-15 | Disposition: A | Discharge status: Home / Self Care | Payer: Medicare Other | Source: Ambulatory Visit | Attending: Cardiology | Admitting: Cardiology

## 2022-04-15 DIAGNOSIS — I208 Other forms of angina pectoris: Secondary | ICD-10-CM | POA: Diagnosis not present

## 2022-04-17 ENCOUNTER — Other Ambulatory Visit: Payer: Self-pay | Admitting: Family Medicine

## 2022-04-17 ENCOUNTER — Encounter (HOSPITAL_COMMUNITY): Payer: Medicare Other

## 2022-04-17 ENCOUNTER — Telehealth (HOSPITAL_COMMUNITY): Payer: Self-pay | Admitting: Family Medicine

## 2022-04-17 DIAGNOSIS — Z1231 Encounter for screening mammogram for malignant neoplasm of breast: Secondary | ICD-10-CM

## 2022-04-17 DIAGNOSIS — R921 Mammographic calcification found on diagnostic imaging of breast: Secondary | ICD-10-CM

## 2022-04-20 ENCOUNTER — Encounter (HOSPITAL_COMMUNITY)
Admission: RE | Admit: 2022-04-20 | Discharge: 2022-04-20 | Disposition: A | Discharge status: Home / Self Care | Payer: Medicare Other | Source: Ambulatory Visit | Attending: Cardiology | Admitting: Cardiology

## 2022-04-20 DIAGNOSIS — Z48812 Encounter for surgical aftercare following surgery on the circulatory system: Secondary | ICD-10-CM | POA: Insufficient documentation

## 2022-04-20 DIAGNOSIS — I208 Other forms of angina pectoris: Secondary | ICD-10-CM | POA: Insufficient documentation

## 2022-04-20 NOTE — Progress Notes (Addendum)
Reviewed home exercise Rx with patient today.  Encouraged warm-up, cool-down, and stretching. Reviewed THRR of 62-124 and keeping RPE between 11-13. Encouraged to hydrate with activity.  ?Reviewed weather parameters for temperature and humidity for safe exercise outdoors. Reviewed S/S to terminate exercise and when to call 911 vs MD. Pt encouraged to always carry a cell phone for safety when exercising outdoors. Pt verbalized understanding of the home exercise Rx and was provided a copy.  ? ? ?Lesly Rubenstein MS, ACSM-CEP, CCRP  ?

## 2022-04-22 ENCOUNTER — Encounter (HOSPITAL_COMMUNITY)
Admission: RE | Admit: 2022-04-22 | Discharge: 2022-04-22 | Disposition: A | Discharge status: Home / Self Care | Payer: Medicare Other | Source: Ambulatory Visit | Attending: Cardiology | Admitting: Cardiology

## 2022-04-22 DIAGNOSIS — I208 Other forms of angina pectoris: Secondary | ICD-10-CM

## 2022-04-22 DIAGNOSIS — Z48812 Encounter for surgical aftercare following surgery on the circulatory system: Secondary | ICD-10-CM | POA: Diagnosis not present

## 2022-04-24 ENCOUNTER — Telehealth: Payer: Self-pay

## 2022-04-24 ENCOUNTER — Encounter (HOSPITAL_COMMUNITY)
Admission: RE | Admit: 2022-04-24 | Discharge: 2022-04-24 | Disposition: A | Discharge status: Home / Self Care | Payer: Medicare Other | Source: Ambulatory Visit | Attending: Cardiology | Admitting: Cardiology

## 2022-04-24 DIAGNOSIS — Z48812 Encounter for surgical aftercare following surgery on the circulatory system: Secondary | ICD-10-CM | POA: Diagnosis not present

## 2022-04-24 DIAGNOSIS — I208 Other forms of angina pectoris: Secondary | ICD-10-CM

## 2022-04-24 NOTE — Telephone Encounter (Signed)
VMT pt requesting call back to discuss next PREP class in [place].  ? ?

## 2022-04-24 NOTE — Telephone Encounter (Signed)
Called to discuss PREP program referral; left voicemail. 

## 2022-04-24 NOTE — Telephone Encounter (Signed)
She returned my call, wants to attend at [facility]; still working with cardiac rehab, on the waiting list for liver transplant; have advised to let me know when she finishes cardiac rehab and will bridge her over to PREP at [place]; will also contact Verdis Frederickson @ Cardiac Rehab to determine when she finishes the program there.  ?

## 2022-04-27 ENCOUNTER — Encounter (HOSPITAL_COMMUNITY)
Admission: RE | Admit: 2022-04-27 | Discharge: 2022-04-27 | Disposition: A | Discharge status: Home / Self Care | Payer: Medicare Other | Source: Ambulatory Visit | Attending: Cardiology | Admitting: Cardiology

## 2022-04-27 DIAGNOSIS — I208 Other forms of angina pectoris: Secondary | ICD-10-CM

## 2022-04-27 DIAGNOSIS — Z48812 Encounter for surgical aftercare following surgery on the circulatory system: Secondary | ICD-10-CM | POA: Diagnosis not present

## 2022-04-28 ENCOUNTER — Telehealth: Payer: Self-pay

## 2022-04-28 ENCOUNTER — Telehealth (HOSPITAL_COMMUNITY): Payer: Self-pay | Admitting: *Deleted

## 2022-04-28 NOTE — Telephone Encounter (Signed)
Received call from Roisin she has been scheduled to receive her liver transplant at [facility] tomorrow at 59. Will cancel appointment for tomorrow and tentatively discharge from cardiac rehab after transplant is completed.Barnet Pall, RN,BSN ?04/28/2022 5:19 PM  ?

## 2022-04-28 NOTE — Telephone Encounter (Signed)
Called to discuss PREP, she is waiting to hear from [place] today about possible transplant; will either plan to start PREP if she is not a candidate or will wait to enroll after transplant once she has recovered.  ?

## 2022-04-28 NOTE — Progress Notes (Signed)
Cardiac Individual Treatment Plan ? ?Patient Details  ?Name: Dawn Moon ?MRN: [id number] ?Date of Birth: 1956-06-22 ?Referring Provider:   ?Flowsheet Row CARDIAC REHAB PHASE II ORIENTATION from 03/12/2022 in [place]  ?Referring Provider Fae Pippin, MD  ? ?  ? ? ?Initial Encounter Date:  ?Flowsheet Row CARDIAC REHAB PHASE II ORIENTATION from 03/12/2022 in [place]  ?Date 03/12/22  ? ?  ? ? ?Visit Diagnosis: Stable angina ([place]) ? ?Patient's Home Medications on Admission: ? ?Current Outpatient Medications:  ?  diphenoxylate-atropine (LOMOTIL) 2.5-0.025 MG tablet, TAKE  (1) OR (2)  TABLETS EVERY SIX HOURS AS NEEDED, Disp: 60 tablet, Rfl: 2 ?  furosemide (LASIX) 40 MG tablet, TAKE 1 TABLET DAILY, Disp: 90 tablet, Rfl: 3 ?  Lidocaine 4 % PTCH, Apply 1 application topically daily as needed (pain)., Disp: , Rfl:  ?  Multiple Vitamin (MULTIVITAMIN WITH MINERALS) TABS tablet, Take 1 tablet by mouth in the morning., Disp: , Rfl:  ?  nadolol (CORGARD) 20 MG tablet, Take 20 mg by mouth in the morning., Disp: , Rfl:  ?  omeprazole (PRILOSEC) 20 MG capsule, Take 20 mg by mouth daily., Disp: , Rfl:  ?  OVER THE COUNTER MEDICATION, Take 1 tablet by mouth daily as needed (Nausea). altoids mints, Disp: , Rfl:  ?  potassium chloride SA (KLOR-CON) 20 MEQ tablet, TAKE 1 TABLET ONCE DAILY, Disp: 90 tablet, Rfl: 3 ?  promethazine (PHENERGAN) 25 MG tablet, Take 1 tablet (25 mg total) by mouth every 6 (six) hours as needed for nausea or vomiting. (Patient taking differently: Take 12.5 mg by mouth daily as needed for nausea or vomiting.), Disp: 30 tablet, Rfl: 1 ?  rifaximin (XIFAXAN) 550 MG TABS tablet, Take 550 mg by mouth 2 (two) times daily., Disp: , Rfl:  ?  spironolactone (ALDACTONE) 100 MG tablet, TAKE 1 TABLET DAILY, Disp: 90 tablet, Rfl: 3 ? ?Current Facility-Administered Medications:  ?  sodium chloride flush (NS) 0.9 % injection 3 mL, 3 mL,  Intravenous, Q12H, Crenshaw, Denice Bors, MD ? ?Past Medical History: ?Past Medical History:  ?Diagnosis Date  ? Ascites   ? Dysrhythmia   ? hx of SVT  ? Encephalopathy, hepatic ([place]) 05/13/2018  ? GERD (gastroesophageal reflux disease)   ? History of exercise stress test   ? 03-07-2010  Stress echo--- no arrhythmias or conduction abnormalilites and negative for ischemia or chest pain  ? History of kidney stones   ? History of paroxysmal supraventricular tachycardia   ? episode 2011  consult w/ dr Caryl Comes --  put on atenolol--  per pt no longer an issue  ? HTN (hypertension)   ? cardiologist --  dr Stanford Breed  ? IBS (irritable bowel syndrome)   ? diarrhea  ? Nonalcoholic steatohepatitis ([facility])   ? Numbness and tingling of left lower extremity   ? post achilles tendon repair  ? OA (osteoarthritis)   ? knees  ? Pleural effusion on right   ? hepatic hydrothorax  ? PMB (postmenopausal bleeding) none recent  ? thickened endometrium  ? PONV (postoperative nausea and vomiting)   ? Recurrent UTI none since 2019  ? Secondary esophageal varices without bleeding ([place]) 06/03/2018  ? Vitamin D deficiency   ? Wears glasses   ? ? ?Tobacco Use: ?Social History  ? ?Tobacco Use  ?Smoking Status Never  ?Smokeless Tobacco Never  ? ? ?Labs: ?Review Flowsheet   ? ?  ?  Latest Ref Rng &  Units 01/10/2012 01/11/2012 06/15/2017 08/25/2018  ?Labs for ITP Cardiac and Pulmonary Rehab  ?Cholestrol 0 - 200 mg/dL  168   200   232    ?LDL (calc) 0 - 99 mg/dL  71   112   122    ?Direct LDL mg/dL      ?HDL-C >39.00 mg/dL  56   50   78.80    ?Trlycerides 0.0 - 149.0 mg/dL  205   192   159.0    ?Hemoglobin A1c <5.7 % 5.6       ? ?  09/05/2019  ?Labs for ITP Cardiac and Pulmonary Rehab  ?Cholestrol 252    ?LDL (calc)   ?Direct LDL 66.0    ?HDL-C 54.70    ?Trlycerides 223.0    ?Hemoglobin A1c   ?  ? ? Multiple values from one day are sorted in reverse-chronological order  ?  ?  ? ? ?Capillary Blood Glucose: ?Lab Results  ?Component Value Date  ? GLUCAP 93 06/13/2020   ? ? ? ?Exercise Target Goals: ?Exercise Program Goal: ?Individual exercise prescription set using results from initial 6 min walk test and THRR while considering  patient?s activity barriers and safety.  ? ?Exercise Prescription Goal: ?Starting with aerobic activity 30 plus minutes a day, 3 days per week for initial exercise prescription. Provide home exercise prescription and guidelines that participant acknowledges understanding prior to discharge. ? ?Activity Barriers & Risk Stratification: ? Activity Barriers & Cardiac Risk Stratification - 03/12/22 1121   ? ?  ? Activity Barriers & Cardiac Risk Stratification  ? Activity Barriers Arthritis;Back Problems;Right Knee Replacement;Neck/Spine Problems;Joint Problems;Deconditioning;Muscular Weakness;Balance Concerns   ? Cardiac Risk Stratification High   ? ?  ?  ? ?  ? ? ?6 Minute Walk: ? 6 Minute Walk   ? ? [place] Name 03/12/22 1120  ?  ?  ?  ? 6 Minute Walk  ? Phase Initial    ? Distance 908 feet    ? Walk Time 6 minutes    ? # of Rest Breaks 0    ? MPH 1.72    ? METS 2.01    ? RPE 9    ? Perceived Dyspnea  0    ? VO2 Peak 7.02    ? Symptoms No    ? Resting HR 63 bpm    ? Resting BP 102/72    ? Resting Oxygen Saturation  100 %    ? Exercise Oxygen Saturation  during 6 min walk 100 %    ? Max Ex. HR 76 bpm    ? Max Ex. BP 108/70    ? 2 Minute Post BP 100/74    ? ?  ?  ? ?  ? ? ?Oxygen Initial Assessment: ? ? ?Oxygen Re-Evaluation: ? ? ?Oxygen Discharge (Final Oxygen Re-Evaluation): ? ? ?Initial Exercise Prescription: ? Initial Exercise Prescription - 03/12/22 1100   ? ?  ? Date of Initial Exercise RX and Referring Provider  ? Date 03/12/22   ? Referring Provider Fae Pippin, MD   ? Expected Discharge Date 05/08/22   ?  ? NuStep  ? Level 1   ? SPM 75   ? Minutes 30   ? METs 2   ?  ? Prescription Details  ? Frequency (times per week) 3   ? Duration Progress to 30 minutes of continuous aerobic without signs/symptoms of physical distress   ?  ? Intensity  ? THRR 40-80%  of Max Heartrate 62-124   ?  Ratings of Perceived Exertion 11-13   ? Perceived Dyspnea 0-4   ?  ? Progression  ? Progression Continue progressive overload as per policy without signs/symptoms or physical distress.   ?  ? Resistance Training  ? Training Prescription Yes   ? Weight 2 lbs   ? Reps 10-15   ? ?  ?  ? ?  ? ? ?Perform Capillary Blood Glucose checks as needed. ? ?Exercise Prescription Changes: ? ? Exercise Prescription Changes   ? ? [place] Name 03/16/22 1400 03/30/22 1500 04/20/22 1400  ?  ?  ?  ? Response to Exercise  ? Blood Pressure (Admit) 102/60 106/71 102/64    ? Blood Pressure (Exercise) 106/70 128/86 106/58    ? Blood Pressure (Exit) 102/70 114/74 102/66    ? Heart Rate (Admit) 70 bpm 72 bpm 66 bpm    ? Heart Rate (Exercise) 79 bpm 79 bpm 83 bpm    ? Heart Rate (Exit) 72 bpm 72 bpm 74 bpm    ? Rating of Perceived Exertion (Exercise) 10 12 12     ? Symptoms None None None    ? Comments Pt's first day in the CRP2 program Reviewed METs Reviewed goals and home exercise Rx    ? Duration Continue with 30 min of aerobic exercise without signs/symptoms of physical distress. Progress to 30 minutes of  aerobic without signs/symptoms of physical distress Continue with 30 min of aerobic exercise without signs/symptoms of physical distress.    ? Intensity THRR unchanged THRR unchanged THRR unchanged    ?  ? Progression  ? Progression Continue to progress workloads to maintain intensity without signs/symptoms of physical distress. Continue to progress workloads to maintain intensity without signs/symptoms of physical distress. Continue to progress workloads to maintain intensity without signs/symptoms of physical distress.    ? Average METs 1.5 1.7 2.6    ?  ? Resistance Training  ? Training Prescription Yes Yes Yes    ? Weight 1 lb 1 lb 1 lb    ? Reps 10-15 10-15 10-15    ? Time 10 Minutes 10 Minutes 10 Minutes    ?  ? Interval Training  ? Interval Training No No No    ?  ? NuStep  ? Level 1 1 2     ? SPM 54 -- --     ? Minutes 30 30 30     ? METs 1.5 1.7 2.6    ?  ? Home Exercise Plan  ? Plans to continue exercise at -- -- Home (comment)    ? Frequency -- -- Add 4 additional days to program exercise sessions.    ? Initial Home

## 2022-04-29 ENCOUNTER — Encounter (HOSPITAL_COMMUNITY): Payer: Medicare Other

## 2022-04-29 ENCOUNTER — Telehealth (HOSPITAL_COMMUNITY): Payer: Self-pay | Admitting: *Deleted

## 2022-04-29 NOTE — Telephone Encounter (Signed)
Patient left message on department voicemail saying that she didn't sleep well last night, very nauseated, on her way home from [facility] now. Will be out today, plans to return on Friday. ?

## 2022-05-01 ENCOUNTER — Encounter (HOSPITAL_COMMUNITY)
Admission: RE | Admit: 2022-05-01 | Discharge: 2022-05-01 | Disposition: A | Discharge status: Home / Self Care | Payer: Medicare Other | Source: Ambulatory Visit | Attending: Cardiology | Admitting: Cardiology

## 2022-05-01 DIAGNOSIS — I208 Other forms of angina pectoris: Secondary | ICD-10-CM

## 2022-05-01 DIAGNOSIS — Z48812 Encounter for surgical aftercare following surgery on the circulatory system: Secondary | ICD-10-CM | POA: Diagnosis not present

## 2022-05-04 ENCOUNTER — Encounter (HOSPITAL_COMMUNITY)
Admission: RE | Admit: 2022-05-04 | Discharge: 2022-05-04 | Disposition: A | Discharge status: Home / Self Care | Payer: Medicare Other | Source: Ambulatory Visit | Attending: Cardiology | Admitting: Cardiology

## 2022-05-04 DIAGNOSIS — Z48812 Encounter for surgical aftercare following surgery on the circulatory system: Secondary | ICD-10-CM | POA: Diagnosis not present

## 2022-05-04 DIAGNOSIS — I208 Other forms of angina pectoris: Secondary | ICD-10-CM

## 2022-05-06 ENCOUNTER — Encounter (HOSPITAL_COMMUNITY)
Admission: RE | Admit: 2022-05-06 | Discharge: 2022-05-06 | Disposition: A | Discharge status: Home / Self Care | Payer: Medicare Other | Source: Ambulatory Visit | Attending: Cardiology | Admitting: Cardiology

## 2022-05-06 ENCOUNTER — Telehealth (HOSPITAL_COMMUNITY): Payer: Self-pay

## 2022-05-06 DIAGNOSIS — Z48812 Encounter for surgical aftercare following surgery on the circulatory system: Secondary | ICD-10-CM | POA: Diagnosis not present

## 2022-05-06 DIAGNOSIS — I2089 Other forms of angina pectoris: Secondary | ICD-10-CM

## 2022-05-06 DIAGNOSIS — I208 Other forms of angina pectoris: Secondary | ICD-10-CM

## 2022-05-06 NOTE — Telephone Encounter (Signed)
Called patient and left voicemail regarding her questions today about sodium levels and dietary intake of sodium. Left voicemail recommending calling [facility] team for further recommendations.  ?

## 2022-05-07 ENCOUNTER — Ambulatory Visit
Admission: RE | Admit: 2022-05-07 | Discharge: 2022-05-07 | Disposition: A | Discharge status: Home / Self Care | Payer: Medicare Other | Source: Ambulatory Visit | Attending: Family Medicine | Admitting: Family Medicine

## 2022-05-07 ENCOUNTER — Other Ambulatory Visit: Payer: Self-pay | Admitting: Family Medicine

## 2022-05-07 DIAGNOSIS — Z1231 Encounter for screening mammogram for malignant neoplasm of breast: Secondary | ICD-10-CM

## 2022-05-07 DIAGNOSIS — R921 Mammographic calcification found on diagnostic imaging of breast: Secondary | ICD-10-CM

## 2022-05-08 ENCOUNTER — Encounter (HOSPITAL_COMMUNITY): Payer: Medicare Other

## 2022-05-11 ENCOUNTER — Encounter (HOSPITAL_COMMUNITY)
Admission: RE | Admit: 2022-05-11 | Discharge: 2022-05-11 | Disposition: A | Discharge status: Home / Self Care | Payer: Medicare Other | Source: Ambulatory Visit | Attending: Cardiology | Admitting: Cardiology

## 2022-05-11 DIAGNOSIS — Z48812 Encounter for surgical aftercare following surgery on the circulatory system: Secondary | ICD-10-CM | POA: Diagnosis not present

## 2022-05-11 DIAGNOSIS — I208 Other forms of angina pectoris: Secondary | ICD-10-CM

## 2022-05-13 ENCOUNTER — Encounter (HOSPITAL_COMMUNITY)
Admission: RE | Admit: 2022-05-13 | Discharge: 2022-05-13 | Disposition: A | Discharge status: Home / Self Care | Payer: Medicare Other | Source: Ambulatory Visit | Attending: Cardiology | Admitting: Cardiology

## 2022-05-13 VITALS — Ht 63.0 in | Wt 164.0 lb

## 2022-05-13 DIAGNOSIS — Z48812 Encounter for surgical aftercare following surgery on the circulatory system: Secondary | ICD-10-CM | POA: Diagnosis not present

## 2022-05-13 DIAGNOSIS — I208 Other forms of angina pectoris: Secondary | ICD-10-CM

## 2022-05-20 ENCOUNTER — Encounter (HOSPITAL_COMMUNITY)
Admission: RE | Admit: 2022-05-20 | Discharge: 2022-05-20 | Disposition: A | Discharge status: Home / Self Care | Payer: Medicare Other | Source: Ambulatory Visit | Attending: Cardiology | Admitting: Cardiology

## 2022-05-20 DIAGNOSIS — I208 Other forms of angina pectoris: Secondary | ICD-10-CM

## 2022-05-20 DIAGNOSIS — I2089 Other forms of angina pectoris: Secondary | ICD-10-CM

## 2022-05-20 DIAGNOSIS — Z48812 Encounter for surgical aftercare following surgery on the circulatory system: Secondary | ICD-10-CM | POA: Diagnosis not present

## 2022-05-22 ENCOUNTER — Encounter (HOSPITAL_COMMUNITY)
Admission: RE | Admit: 2022-05-22 | Discharge: 2022-05-22 | Disposition: A | Discharge status: Home / Self Care | Payer: Medicare Other | Source: Ambulatory Visit | Attending: Cardiology | Admitting: Cardiology

## 2022-05-22 VITALS — BP 120/70 | HR 76 | Wt 159.6 lb

## 2022-05-22 DIAGNOSIS — I208 Other forms of angina pectoris: Secondary | ICD-10-CM | POA: Diagnosis present

## 2022-05-25 NOTE — Progress Notes (Signed)
Discharge Progress Report  Patient Details  Name: Dawn Moon MRN: [id number] Date of Birth: [birth year] Referring Provider:   Flowsheet Row CARDIAC REHAB PHASE II ORIENTATION from 03/12/2022 in [facility]  Referring Provider Fae Pippin, MD        Number of Visits: 23  Reason for Discharge:  Patient reached a stable level of exercise. Patient has met program and personal goals.  Smoking History:  Social History   Tobacco Use  Smoking Status Never  Smokeless Tobacco Never    Diagnosis:  Stable angina ([place])  ADL [facility]:   Initial Exercise Prescription:  Initial Exercise Prescription - 03/12/22 1100       Date of Initial Exercise RX and Referring Provider   Date 03/12/22    Referring Provider Fae Pippin, MD    Expected Discharge Date 05/08/22      NuStep   Level 1    SPM 75    Minutes 30    METs 2      Prescription Details   Frequency (times per week) 3    Duration Progress to 30 minutes of continuous aerobic without signs/symptoms of physical distress      Intensity   THRR 40-80% of Max Heartrate 62-124    Ratings of Perceived Exertion 11-13    Perceived Dyspnea 0-4      Progression   Progression Continue progressive overload as per policy without signs/symptoms or physical distress.      Resistance Training   Training Prescription Yes    Weight 2 lbs    Reps 10-15             Discharge Exercise Prescription (Final Exercise Prescription Changes):  Exercise Prescription Changes - 05/22/22 1526       Response to Exercise   Blood Pressure (Admit) 120/70    Blood Pressure (Exercise) 118/72    Blood Pressure (Exit) 98/54    Heart Rate (Admit) 76 bpm    Heart Rate (Exercise) 103 bpm    Heart Rate (Exit) 83 bpm    Rating of Perceived Exertion (Exercise) 12    Symptoms None    Comments Last exercise session.    Duration Continue with 30 min of aerobic exercise without signs/symptoms of physical  distress.    Intensity THRR unchanged      Progression   Progression Continue to progress workloads to maintain intensity without signs/symptoms of physical distress.    Average METs 2.9      Resistance Training   Training Prescription Yes    Weight 1 lb    Reps 10-15    Time 10 Minutes      Interval Training   Interval Training No      NuStep   Level 3    Minutes 15    METs 3.6      Track   Laps 11    Minutes 15    METs 2.28      Home Exercise Plan   Plans to continue exercise at Home (comment)    Frequency Add 4 additional days to program exercise sessions.    Initial Home Exercises Provided 04/20/22             Functional Capacity:  6 Minute Walk     Row Name 03/12/22 1120 05/13/22 1315       6 Minute Walk   Phase Initial Discharge    Distance 908 feet 1306 feet    Distance % Change -- 43.83 %  Distance Feet Change -- 398 ft    Walk Time 6 minutes 6 minutes    # of Rest Breaks 0 0    MPH 1.72 2.5    METS 2.01 2.82    RPE 9 11.5    Perceived Dyspnea  0 0    VO2 Peak 7.02 9.9    Symptoms No No    Resting HR 63 bpm 73 bpm    Resting BP 102/72 106/70    Resting Oxygen Saturation  100 % 98 %    Exercise Oxygen Saturation  during 6 min walk 100 % 98 %    Max Ex. HR 76 bpm 91 bpm    Max Ex. BP 108/70 114/70    2 Minute Post BP 100/74 --             Psychological, QOL, Others - Outcomes: PHQ 2/9:    06/08/2022    8:53 AM 03/12/2022   12:22 PM 03/12/2022   12:19 PM 09/05/2019    8:47 AM 04/10/2019    1:19 PM  Depression screen PHQ 2/9  Decreased Interest 0 0 0 0 0  Down, Depressed, Hopeless 0 0 0 0 0  PHQ - 2 Score 0 0 0 0 0    Quality of Life:  Quality of Life - 05/04/22 1629       Quality of Life   Select Quality of Life      Quality of Life Scores   Health/Function Pre 19.7 %    Health/Function Post 27.03 %    Health/Function % Change 37.21 %    Socioeconomic Pre 25.71 %    Socioeconomic Post 27.43 %    Socioeconomic % Change   6.69 %    Psych/Spiritual Pre 23.64 %    Psych/Spiritual Post 30 %    Psych/Spiritual % Change 26.9 %    Family Pre 30 %    Family Post 30 %    Family % Change 0 %    GLOBAL Pre 23.26 %    GLOBAL Post 28.16 %    GLOBAL % Change 21.07 %             Personal Goals: Goals established at orientation with interventions provided to work toward goal.  Personal Goals and Risk Factors at Admission - 03/12/22 1117       Core Components/Risk Factors/Patient Goals on Admission    Weight Management Yes;Weight Maintenance;Weight Loss    Intervention Weight Management: Develop a combined nutrition and exercise program designed to reach desired caloric intake, while maintaining appropriate intake of nutrient and fiber, sodium and fats, and appropriate energy expenditure required for the weight goal.;Weight Management: Provide education and appropriate resources to help participant work on and attain dietary goals.    Expected Outcomes Short Term: Continue to assess and modify interventions until short term weight is achieved;Long Term: Adherence to nutrition and physical activity/exercise program aimed toward attainment of established weight goal;Understanding recommendations for meals to include 15-35% energy as protein, 25-35% energy from fat, 35-60% energy from carbohydrates, less than 257m of dietary cholesterol, 20-35 gm of total fiber daily;Understanding of distribution of calorie intake throughout the day with the consumption of 4-5 meals/snacks    Hypertension Yes    Intervention Provide education on lifestyle modifcations including regular physical activity/exercise, weight management, moderate sodium restriction and increased consumption of fresh fruit, vegetables, and low fat dairy, alcohol moderation, and smoking cessation.;Monitor prescription use compliance.    Expected Outcomes Short Term: Continued assessment  and intervention until BP is < 140/63m HG in hypertensive participants. <  130/854mHG in hypertensive participants with diabetes, heart failure or chronic kidney disease.;Long Term: Maintenance of blood pressure at goal levels.    Lipids Yes    Intervention Provide education and support for participant on nutrition & aerobic/resistive exercise along with prescribed medications to achieve LDL <7098mHDL >10m59m  Expected Outcomes Short Term: Participant states understanding of desired cholesterol values and is compliant with medications prescribed. Participant is following exercise prescription and nutrition guidelines.;Long Term: Cholesterol controlled with medications as prescribed, with individualized exercise RX and with personalized nutrition plan. Value goals: LDL < 70mg81mL > 40 mg.    Stress Yes    Intervention Offer individual and/or small group education and counseling on adjustment to heart disease, stress management and health-related lifestyle change. Teach and support self-help strategies.;Refer participants experiencing significant psychosocial distress to appropriate mental health specialists for further evaluation and treatment. When possible, include family members and significant others in education/counseling sessions.    Expected Outcomes Short Term: Participant demonstrates changes in health-related behavior, relaxation and other stress management skills, ability to obtain effective social support, and compliance with psychotropic medications if prescribed.;Long Term: Emotional wellbeing is indicated by absence of clinically significant psychosocial distress or social isolation.              Personal Goals Discharge:  Goals and Risk Factor Review     Row Name 03/17/22 1620 03/31/22 1711 04/28/22 1710 05/25/22 1323       Core Components/Risk Factors/Patient Goals Review   Personal Goals Review Weight Management/Obesity;Hypertension;Lipids;Stress Weight Management/Obesity;Hypertension;Lipids;Stress Weight  Management/Obesity;Hypertension;Lipids;Stress Weight Management/Obesity;Hypertension;Lipids;Stress    Review DorotIviannated cardiac rehab on 03/16/22 and did well with exercise for her fitness level. VSS DorotJersigood participation in phase 2 cardiac rehab. Cardiology and primary care notified about weight gain. Vital signs have been stable. DorotBlonniegood participation in phase 2 cardiac rehab. DorotBriyannalost 2.8 kg since starting phase 2 cardiac rehab. DorotIlhangood participation in phase 2 cardiac rehab. Destinie completed phase 2 cardiac rehab on 05/22/22    Expected Outcomes DorotAzyah continue to participate in phase 2 cardiac rehab for exercise, nutrition and lifestyle modifications DorotTayleigh continue to participate in phase 2 cardiac rehab for exercise, nutrition and lifestyle modifications DorotTrinidy continue to participate in phase 2 cardiac rehab for exercise, nutrition and lifestyle modifications DorotAviona continue to exercise as she is able, follow nutrition and lifestyle modifications.             Exercise Goals and Review:  Exercise Goals     Row Name 03/12/22 1122             Exercise Goals   Increase Physical Activity Yes       Intervention Provide advice, education, support and counseling about physical activity/exercise needs.;Develop an individualized exercise prescription for aerobic and resistive training based on initial evaluation findings, risk stratification, comorbidities and participant's personal goals.       Expected Outcomes Short Term: Attend rehab on a regular basis to increase amount of physical activity.;Long Term: Add in home exercise to make exercise part of routine and to increase amount of physical activity.;Long Term: Exercising regularly at least 3-5 days a week.       Increase Strength and Stamina Yes       Intervention Provide advice, education, support and counseling about physical activity/exercise needs.;Develop an  individualized exercise prescription for aerobic and  resistive training based on initial evaluation findings, risk stratification, comorbidities and participant's personal goals.       Expected Outcomes Short Term: Increase workloads from initial exercise prescription for resistance, speed, and METs.;Short Term: Perform resistance training exercises routinely during rehab and add in resistance training at home;Long Term: Improve cardiorespiratory fitness, muscular endurance and strength as measured by increased METs and functional capacity (6MWT)       Able to understand and use rate of perceived exertion (RPE) scale Yes       Intervention Provide education and explanation on how to use RPE scale       Expected Outcomes Short Term: Able to use RPE daily in rehab to express subjective intensity level;Long Term:  Able to use RPE to guide intensity level when exercising independently       Knowledge and understanding of Target Heart Rate Range (THRR) Yes       Intervention Provide education and explanation of THRR including how the numbers were predicted and where they are located for reference       Expected Outcomes Short Term: Able to state/look up THRR;Short Term: Able to use daily as guideline for intensity in rehab;Long Term: Able to use THRR to govern intensity when exercising independently       Understanding of Exercise Prescription Yes       Intervention Provide education, explanation, and written materials on patient's individual exercise prescription       Expected Outcomes Short Term: Able to explain program exercise prescription;Long Term: Able to explain home exercise prescription to exercise independently                Exercise Goals Re-Evaluation:  Exercise Goals Re-Evaluation     Row Name 03/16/22 1424 04/20/22 1424 05/22/22 1409         Exercise Goal Re-Evaluation   Exercise Goals Review Increase Physical Activity;Increase Strength and Stamina;Able to understand and use  rate of perceived exertion (RPE) scale;Knowledge and understanding of Target Heart Rate Range (THRR);Understanding of Exercise Prescription Increase Physical Activity;Increase Strength and Stamina;Able to understand and use rate of perceived exertion (RPE) scale;Knowledge and understanding of Target Heart Rate Range (THRR);Understanding of Exercise Prescription Increase Physical Activity;Increase Strength and Stamina;Able to understand and use rate of perceived exertion (RPE) scale;Knowledge and understanding of Target Heart Rate Range (THRR);Understanding of Exercise Prescription     Comments Pt's first day in the [facility] program. Pt understands the exercise Rx, THRR, and RPE scale. Reviewed goals and home exercise Rx. Pt voices that she has more strength and stamina which is a patient goal. Pt alos had a goal to walk at least 5x/week. Pt reoprts walking daily for 20 minutes. Encouraged patient to try to increase to 30 minutes of walking expect on the days she comes to the [facility] program. Patient completed the cardiac rehab program and made gradual progress with exercise. Patient is walking at home 15 minutes, 2 x's/day, 7 days/week and plans to continue walking and may join a gym near her home.     Expected Outcomes Will continue to monitor patient and progress exercise workloads as tolerated. Pt will continue to walk at home and worrk to increase time to 30 minutes 3-4x/week. Patient will continue daily exercise routine to maintain health and fitness gains.              Nutrition & Weight - Outcomes:  Pre Biometrics - 03/12/22 0900       Pre Biometrics   Waist Circumference 46  inches    Hip Circumference 41 inches    Waist to Hip Ratio 1.12 %    Triceps Skinfold 31 mm    % Body Fat 43.9 %    Grip Strength 22 kg    Flexibility --   Not performed due to back issues   Single Leg Stand 4 seconds             Post Biometrics - 05/13/22 1410        Post  Biometrics   Height 5' 3"  (1.6 m)     Weight 74.4 kg    Waist Circumference 43 inches    Hip Circumference 41 inches    Waist to Hip Ratio 1.05 %    BMI (Calculated) 29.06    Triceps Skinfold 29 mm    % Body Fat 42.8 %    Grip Strength 21 kg    Flexibility --   No done   Single Leg Stand 5.31 seconds             Nutrition:  Nutrition Therapy & Goals - 05/22/22 1407       Nutrition Therapy   Diet Heart Healthy Diet    Drug/Food Interactions Statins/Certain Fruits      Personal Nutrition Goals   Comments Patient graduates today. Patient remains extremely motivated to maintain lifestyle changes in prepartion for liver transplant. Weight remains variable throughout program due to ascites.      Intervention Plan   Intervention Prescribe, educate and counsel regarding individualized specific dietary modifications aiming towards targeted core components such as weight, hypertension, lipid management, diabetes, heart failure and other comorbidities.    Expected Outcomes Long Term Goal: Adherence to prescribed nutrition plan.             Nutrition Discharge:  Nutrition Assessments - 05/06/22 1351       Rate Your Plate Scores   Post Score 93             Education Questionnaire Score:  Knowledge Questionnaire Score - 05/04/22 1629       Knowledge Questionnaire Score   Post Score 24/24             Goals reviewed with patient; copy given to patient.Pt graduated from cardiac rehab program on 05/22/22 with completion of 23 exercise sessions in Phase II. Pt maintained good attendance and progressed nicely during his participation in rehab as evidenced by increased MET level.   Medication list reconciled. Repeat  PHQ score- 0 .  Pt has made significant lifestyle changes and should be commended for her success. Pt feels she has achieved her goals during cardiac rehab.   Pt plans to continue exercise by walking and may consider joining the prep program. Earlie Server increased her distance on her post exercise walk  test by 398 feet. We are proud of Tinnie's progress! Harrell Gave RN BSN

## 2022-06-03 HISTORY — PX: LIVER TRANSPLANTATION: SUR1389

## 2022-06-16 ENCOUNTER — Encounter: Payer: Self-pay | Admitting: Physical Therapy

## 2022-06-16 ENCOUNTER — Ambulatory Visit: Payer: Medicare Other | Attending: Family | Admitting: Physical Therapy

## 2022-06-16 DIAGNOSIS — M6281 Muscle weakness (generalized): Secondary | ICD-10-CM | POA: Diagnosis present

## 2022-06-16 NOTE — Therapy (Signed)
[facility] [place], [place], [address] Phone: [phone]   Fax:  [phone]  Physical Therapy Evaluation  Patient Details  Name: Dawn Moon MRN: [id number] Date of Birth: 05-05-1956 Referring Provider (PT): Lottie Rater NP.   Encounter Date: 06/16/2022   PT End of Session - 06/16/22 1226     Visit Number 1    Number of Visits 16    Date for PT Re-Evaluation 09/14/22    PT Start Time 1030    PT Stop Time 1102    PT Time Calculation (min) 32 min             Past Medical History:  Diagnosis Date   Ascites    Dysrhythmia    hx of SVT   Encephalopathy, hepatic ([place]) 05/13/2018   GERD (gastroesophageal reflux disease)    History of exercise stress test    03-07-2010  Stress echo--- no arrhythmias or conduction abnormalilites and negative for ischemia or chest pain   History of kidney stones    History of paroxysmal supraventricular tachycardia    episode 2011  consult w/ dr Caryl Comes --  put on atenolol--  per pt no longer an issue   HTN (hypertension)    cardiologist --  dr Stanford Breed   IBS (irritable bowel syndrome)    diarrhea   Nonalcoholic steatohepatitis (NASH)    Numbness and tingling of left lower extremity    post achilles tendon repair   OA (osteoarthritis)    knees   Pleural effusion on right    hepatic hydrothorax   PMB (postmenopausal bleeding) none recent   thickened endometrium   PONV (postoperative nausea and vomiting)    Recurrent UTI none since 2019   Secondary esophageal varices without bleeding ([place]) 06/03/2018   Vitamin D deficiency    Wears glasses     Past Surgical History:  Procedure Laterality Date   ACHILLES TENDON SURGERY Left 06/2016   BIOPSY  11/25/2021   Procedure: BIOPSY;  Surgeon: Gatha Mayer, MD;  Location: [facility];  Service: Endoscopy;;   BREAST BIOPSY Left 02/2016   benign   CARDIAC CATHETERIZATION     COLONOSCOPY  2005   COLONOSCOPY WITH  PROPOFOL N/A 11/25/2021   Procedure: COLONOSCOPY WITH PROPOFOL;  Surgeon: Gatha Mayer, MD;  [facility]: [facility];  Service: Endoscopy;  Laterality: N/A;   CT CTA CORONARY W/CA SCORE W/CM &/OR WO/CM  01/01/2014   non-obstructive calcified plaque in pLAD (0-25%), no significant incidental noncardiac findings noted   DILATATION & CURETTAGE/HYSTEROSCOPY WITH MYOSURE N/A 12/02/2020   Procedure: DILATATION & CURETTAGE/HYSTEROSCOPY WITH POLYPECTOMY AND REMOVAL OF VAGINAL LESION;  Surgeon: [name], MD;  Location: [facility];  Service: Gynecology;  Laterality: N/A;  request to follow in [place] held (DR. Delilah Shan has two other cases that morning starting 7:30am)   ESOPHAGOGASTRODUODENOSCOPY  01/2014   EXTRACORPOREAL SHOCK WAVE LITHOTRIPSY  2010   KNEE ARTHROPLASTY Right 03/24/2018   Procedure: RIGHT TOTAL KNEE ARTHROPLASTY WITH COMPUTER NAVIGATION;  Surgeon: Rod Can, MD;  [facility]: [facility];  Service: Orthopedics;  Laterality: Right;  Needs RNFA   KNEE ARTHROSCOPY Right 12/04/2016   Procedure: ARTHROSCOPY KNEE WITH PARTIAL MEDIAL MENISCECTOMY;  Surgeon: Rod Can, MD;  Location: [facility];  Service: Orthopedics;  Laterality: Right;   LEFT HEART CATH AND CORONARY ANGIOGRAPHY N/A 11/20/2021   Procedure: LEFT HEART CATH AND CORONARY ANGIOGRAPHY;  Surgeon: Burnell Blanks, MD;  Location: [facility];  Service: Cardiovascular;  Laterality: N/A;   LEFT HEART CATHETERIZATION WITH CORONARY ANGIOGRAM N/A 01/11/2012   Procedure: LEFT HEART CATHETERIZATION WITH CORONARY ANGIOGRAM;  Surgeon: Sherren Mocha, MD;  Location: [facility];  Service: Cardiovascular;  Laterality: N/A;  widely patent coronary arterires without significant obstructive CAD,  normal LVF, ef 55-56%   TONSILLECTOMY AND ADENOIDECTOMY  child   TRANSTHORACIC ECHOCARDIOGRAM  01/18/2012   mild LVH,  ef 60%/  trivial TR   TUBAL LIGATION  1985   UPPER GASTROINTESTINAL  ENDOSCOPY  last done 2019    Vitals:   06/16/22 1227  SpO2: 98%      Subjective Assessment - 06/16/22 1229     Subjective The patient presents to the clinic today having undergone a liver transplant on 06/03/22.  She is doing extremely well and is very happy at her results this far.  She is walking safely with a Rollator at this time.  She is wearing long socks and slippers with slits on top to accomoadate her feet which are currenly swollen.  She has a very bright affect.    Pertinent History Right TKA, Left Achilles surgery, HTN, OA.    How long can you sit comfortably? Unlimited.    How long can you walk comfortably? Around home wiht Rollator and sometimes without.    Patient Stated Goals Become completely independent.    Currently in Pain? No/denies                [facility] PT Assessment - 06/16/22 0001       Assessment   Medical Diagnosis Liver transplant.    Referring Provider (PT) Lottie Rater NP.    Onset Date/Surgical Date --   06/03/22 (Liver transplant surgery).     Precautions   Precaution Comments 5# lifting limit currently.      Restrictions   Weight Bearing Restrictions No      Balance Screen   Has the patient fallen in the past 6 months No    Has the patient had a decrease in activity level because of a fear of falling?  No    Is the patient reluctant to leave their home because of a fear of falling?  No      Home Environment   Living Environment Private residence      Prior Function   Level of Independence Independent with basic ADLs      Observation/Other Assessments-Edema    Edema --   Moderate bilateral LE edema.     ROM / Strength   AROM / PROM / Strength AROM;Strength      AROM   Overall AROM Comments WFL's for bilateral LE's (right knee lacks 5 degrees from full extension.      Strength   Overall Strength Comments Bilateral hip flexion is 4/5, abduction is 4 to 4+/5, bilateral knee and ankle strength is 4+/5.      Special Tests    Other special tests (-) Romberg test.      Bed Mobility   Bed Mobility Sit to Supine;Supine to Sit    Supine to Sit Independent    Sit to Supine Independent      Transfers   Comments Sit to stand with armrest.      Ambulation/Gait   Gait Comments Safe ambulation with a Rollator.                        Objective measurements completed on examination: See above  findings.                     PT Long Term Goals - 06/16/22 1242       PT LONG TERM GOAL #1   Title Patient will be independent with her HEP.    Time 6      PT LONG TERM GOAL #2   Title Walk 500 feet without assisitive device and no rest stop.    Time 6    Period Weeks    Status New      PT LONG TERM GOAL #3   Title Increase bilateral LE strength to 5/5 to provide good stability for accomplishment of functional activities.    Time 6    Period Weeks    Status New      PT LONG TERM GOAL #4   Title Perform normal ADL's independently.    Time 6    Period Weeks    Status New                    Plan - 06/16/22 1237     Clinical Impression Statement The patient presents to OPPt s/p liver transplant performed on 06/03/22.  She is doing extremely well and is very pleased with her staut thus far.  She walks safely with a Rollator.  She easily transitions from sit to stand with armrest.  Sit to supine to sit transitions are essentially independent.  She demonstrates a negative Romberg test.  She has moderate LE edema currently.  Patient will benefit from skilled physical therapy intervention to address deficits.    Personal Factors and Comorbidities Comorbidity 1;Comorbidity 2;Other    Comorbidities Right TKA, Left Achilles surgery, HTN, OA.    Examination-Activity Limitations Other;Locomotion Level    Examination-Participation Restrictions Other;Meal Prep    Stability/Clinical Decision Making Stable/Uncomplicated    Clinical Decision Making Low    Rehab Potential Excellent     PT Frequency 2x / week    PT Duration 6 weeks    PT Treatment/Interventions Therapeutic exercise;Therapeutic activities;Functional mobility training;Gait training;Neuromuscular re-education;Patient/family education;ADLs/Self Care Home Management    PT Next Visit Plan Nustep (please monitor 02 and HR), 5# weight limit.  Generalized conditioning and strengthening.  Gait activities.    Consulted and Agree with Plan of Care Patient             Patient will benefit from skilled therapeutic intervention in order to improve the following deficits and impairments:  Pain, Decreased activity tolerance, Decreased strength  Visit Diagnosis: Muscle weakness (generalized)     Problem List Patient Active Problem List   Diagnosis Date Noted   Colon cancer screening    Mucosal abnormality of colon    Coronary artery disease involving native coronary artery of native heart without angina pectoris    Adnexal mass 10/25/2020   Elevated cancer antigen 125 (CA 125) 10/25/2020   Thickened endometrium 10/25/2020   Portal hypertensive gastropathy ([place]) 02/29/2020   Recurrent UTI 06/12/2019   Secondary insomnia 04/10/2019   Anemia 02/28/2019   Thrombocytopenia ([place]) 02/28/2019   Degeneration of lumbar intervertebral disc 08/06/2018   Secondary esophageal varices without bleeding ([place]) 06/03/2018   Encephalopathy, hepatic ([place]) 05/13/2018   History of paroxysmal supraventricular tachycardia    Liver cirrhosis secondary to NASH ([place]) 04/23/2018   Irritable bowel syndrome-diarrhea predominant 06/15/2017   Gastroesophageal reflux disease 06/15/2017   Hyperlipidemia    Kidney stone    Essential hypertension 02/13/2010   Rationale for Evaluation  and Treatment Rehabilitation.  Nevea Spiewak, [place], PT 06/16/2022, 12:47 PM  [facility] [address] [place], [place], [address] Phone: [phone]   Fax:  [phone]  Name: Dawn Moon MRN:  [id number] Date of Birth: 04-19-1956

## 2022-06-18 ENCOUNTER — Ambulatory Visit: Payer: Medicare Other | Admitting: *Deleted

## 2022-06-18 DIAGNOSIS — M6281 Muscle weakness (generalized): Secondary | ICD-10-CM | POA: Diagnosis not present

## 2022-06-18 NOTE — Therapy (Signed)
[facility] [place], [place], [address] Phone: [phone]   Fax:  [phone]  Physical Therapy Treatment  Patient Details  Name: Dawn Moon MRN: [id number] Date of Birth: [birth year] Referring Provider (PT): Lottie Rater NP.   Encounter Date: 06/18/2022   PT End of Session - 06/18/22 1116     Visit Number 2    Number of Visits 16    Date for PT Re-Evaluation 09/14/22    PT Start Time 1030    PT Stop Time 1112    PT Time Calculation (min) 42 min             Past Medical History:  Diagnosis Date   Ascites    Dysrhythmia    hx of SVT   Encephalopathy, hepatic ([place]) 05/13/2018   GERD (gastroesophageal reflux disease)    History of exercise stress test    03-07-2010  Stress echo--- no arrhythmias or conduction abnormalilites and negative for ischemia or chest pain   History of kidney stones    History of paroxysmal supraventricular tachycardia    episode 2011  consult w/ dr Caryl Comes --  put on atenolol--  per pt no longer an issue   HTN (hypertension)    cardiologist --  dr Stanford Breed   IBS (irritable bowel syndrome)    diarrhea   Nonalcoholic steatohepatitis (NASH)    Numbness and tingling of left lower extremity    post achilles tendon repair   OA (osteoarthritis)    knees   Pleural effusion on right    hepatic hydrothorax   PMB (postmenopausal bleeding) none recent   thickened endometrium   PONV (postoperative nausea and vomiting)    Recurrent UTI none since 2019   Secondary esophageal varices without bleeding ([place]) 06/03/2018   Vitamin D deficiency    Wears glasses     Past Surgical History:  Procedure Laterality Date   ACHILLES TENDON SURGERY Left 06/2016   BIOPSY  11/25/2021   Procedure: BIOPSY;  Surgeon: Gatha Mayer, MD;  [facility];  Service: Endoscopy;;   BREAST BIOPSY Left 02/2016   benign   CARDIAC CATHETERIZATION     COLONOSCOPY  2005   COLONOSCOPY WITH  PROPOFOL N/A 11/25/2021   Procedure: COLONOSCOPY WITH PROPOFOL;  Surgeon: Gatha Mayer, MD;  [facility];  Service: Endoscopy;  Laterality: N/A;   CT CTA CORONARY W/CA SCORE W/CM &/OR WO/CM  01/01/2014   non-obstructive calcified plaque in pLAD (0-25%), no significant incidental noncardiac findings noted   DILATATION & CURETTAGE/HYSTEROSCOPY WITH MYOSURE N/A 12/02/2020   Procedure: DILATATION & CURETTAGE/HYSTEROSCOPY WITH POLYPECTOMY AND REMOVAL OF VAGINAL LESION;  Surgeon: [name], MD;  Location: [facility];  Service: Gynecology;  Laterality: N/A;  request to follow in [place] held (DR. Delilah Shan has two other cases that morning starting 7:30am)   ESOPHAGOGASTRODUODENOSCOPY  01/2014   EXTRACORPOREAL SHOCK WAVE LITHOTRIPSY  2010   KNEE ARTHROPLASTY Right 03/24/2018   Procedure: RIGHT TOTAL KNEE ARTHROPLASTY WITH COMPUTER NAVIGATION;  Surgeon: Rod Can, MD;  [facility];  Service: Orthopedics;  Laterality: Right;  Needs RNFA   KNEE ARTHROSCOPY Right 12/04/2016   Procedure: ARTHROSCOPY KNEE WITH PARTIAL MEDIAL MENISCECTOMY;  Surgeon: Rod Can, MD;  Location: [facility];  Service: Orthopedics;  Laterality: Right;   LEFT HEART CATH AND CORONARY ANGIOGRAPHY N/A 11/20/2021   Procedure: LEFT HEART CATH AND CORONARY ANGIOGRAPHY;  Surgeon: Burnell Blanks, MD;  Location: [facility];  Service: Cardiovascular;  Laterality: N/A;   LEFT HEART CATHETERIZATION WITH CORONARY ANGIOGRAM N/A 01/11/2012   Procedure: LEFT HEART CATHETERIZATION WITH CORONARY ANGIOGRAM;  Surgeon: Sherren Mocha, MD;  Location: [facility]ATH [facility];  Service: Cardiovascular;  Laterality: N/A;  widely patent coronary arterires without significant obstructive CAD,  normal LVF, ef 55-56%   TONSILLECTOMY AND ADENOIDECTOMY  child   TRANSTHORACIC ECHOCARDIOGRAM  01/18/2012   mild LVH,  ef 60%/  trivial TR   TUBAL LIGATION  1985   UPPER GASTROINTESTINAL  ENDOSCOPY  last done 2019    There were no vitals filed for this visit.   Subjective Assessment - 06/18/22 1047     Subjective Doing okay today. My abdomen is sore 2-3/10    Pertinent History Right TKA, Left Achilles surgery, HTN, OA.    How long can you sit comfortably? Unlimited.    How long can you walk comfortably? Around home wiht Rollator and sometimes without.    Patient Stated Goals Become completely independent.                               [place] Adult PT Treatment/Exercise - 06/18/22 0001       Exercises   Exercises Knee/Hip      Knee/Hip Exercises: Aerobic   Nustep 10 mins L4   )2 92%, HR 84, after O2 96% and HR97     Knee/Hip Exercises: Seated   Long Arc Quad Both;3 sets;10 reps    Ball Squeeze 3x10    Marching AROM;Both;3 sets;10 reps    Hamstring Curl Both;3 sets;10 reps   red                         PT Long Term Goals - 06/16/22 1242       PT LONG TERM GOAL #1   Title Patient will be independent with her HEP.    Time 6      PT LONG TERM GOAL #2   Title Walk 500 feet without assisitive device and no rest stop.    Time 6    Period Weeks    Status New      PT LONG TERM GOAL #3   Title Increase bilateral LE strength to 5/5 to provide good stability for accomplishment of functional activities.    Time 6    Period Weeks    Status New      PT LONG TERM GOAL #4   Title Perform normal ADL's independently.    Time 6    Period Weeks    Status New                   Plan - 06/18/22 1117     Clinical Impression Statement Pt arrived today doing fairly well, but with fatigue and abdomen soreness. She was able to perform 10 mins on the nustep and other seated exs today with O2 staying above 90% and  HR also in the 90's. fatigued end of session    Personal Factors and Comorbidities Comorbidity 1;Comorbidity 2;Other    Comorbidities Right TKA, Left Achilles surgery, HTN, OA.    Examination-Activity Limitations  Other;Locomotion Level    Examination-Participation Restrictions Other;Meal Prep    Stability/Clinical Decision Making Stable/Uncomplicated    Rehab Potential Excellent    PT Frequency 2x / week    PT Duration 6 weeks    PT Treatment/Interventions Therapeutic exercise;Therapeutic activities;Functional mobility  training;Gait training;Neuromuscular re-education;Patient/family education;ADLs/Self Care Home Management    PT Next Visit Plan Nustep (please monitor 02 and HR), 5# weight limit.  Generalized conditioning and strengthening.  Gait activities.    Consulted and Agree with Plan of Care Patient             Patient will benefit from skilled therapeutic intervention in order to improve the following deficits and impairments:  Pain, Decreased activity tolerance, Decreased strength  Visit Diagnosis: Muscle weakness (generalized)     Problem List Patient Active Problem List   Diagnosis Date Noted   Colon cancer screening    Mucosal abnormality of colon    Coronary artery disease involving native coronary artery of native heart without angina pectoris    Adnexal mass 10/25/2020   Elevated cancer antigen 125 (CA 125) 10/25/2020   Thickened endometrium 10/25/2020   Portal hypertensive gastropathy ([place]) 02/29/2020   Recurrent UTI 06/12/2019   Secondary insomnia 04/10/2019   Anemia 02/28/2019   Thrombocytopenia ([place]) 02/28/2019   Degeneration of lumbar intervertebral disc 08/06/2018   Secondary esophageal varices without bleeding ([place]) 06/03/2018   Encephalopathy, hepatic ([place]) 05/13/2018   History of paroxysmal supraventricular tachycardia    Liver cirrhosis secondary to NASH ([place]) 04/23/2018   Irritable bowel syndrome-diarrhea predominant 06/15/2017   Gastroesophageal reflux disease 06/15/2017   Hyperlipidemia    Kidney stone    Essential hypertension 02/13/2010   Rationale for Evaluation and Treatment Rehabilitation  Avante Carneiro,CHRIS, PTA 06/18/2022, 11:21 AM  [facility] [address] [place], [place], [address] Phone: [phone]   Fax:  [phone]  Name: Dawn Moon MRN: [id number] Date of Birth: 12-09-1956

## 2022-06-22 ENCOUNTER — Encounter: Payer: Self-pay | Admitting: *Deleted

## 2022-06-22 ENCOUNTER — Ambulatory Visit: Payer: Medicare Other | Attending: Family | Admitting: *Deleted

## 2022-06-22 DIAGNOSIS — M6281 Muscle weakness (generalized): Secondary | ICD-10-CM | POA: Diagnosis present

## 2022-06-22 NOTE — Therapy (Signed)
OUTPATIENT PHYSICAL THERAPY TREATMENT NOTE   Patient Name: Dawn Moon MRN: [id number] DOB:01-17-1956, 66 y.o., female Today's Date: 06/22/2022  REFERRING PROVIDER: Lottie Rater NP.   PT End of Session - 06/22/22 1040     Visit Number 3    Number of Visits 16    Date for PT Re-Evaluation 09/14/22    PT Start Time 1030    PT Stop Time 1120    PT Time Calculation (min) 50 min             Past Medical History:  Diagnosis Date   Ascites    Dysrhythmia    hx of SVT   Encephalopathy, hepatic ([place]) 05/13/2018   GERD (gastroesophageal reflux disease)    History of exercise stress test    03-07-2010  Stress echo--- no arrhythmias or conduction abnormalilites and negative for ischemia or chest pain   History of kidney stones    History of paroxysmal supraventricular tachycardia    episode 2011  consult w/ dr Caryl Comes --  put on atenolol--  per pt no longer an issue   HTN (hypertension)    cardiologist --  dr Stanford Breed   IBS (irritable bowel syndrome)    diarrhea   Nonalcoholic steatohepatitis (NASH)    Numbness and tingling of left lower extremity    post achilles tendon repair   OA (osteoarthritis)    knees   Pleural effusion on right    hepatic hydrothorax   PMB (postmenopausal bleeding) none recent   thickened endometrium   PONV (postoperative nausea and vomiting)    Recurrent UTI none since 2019   Secondary esophageal varices without bleeding ([place]) 06/03/2018   Vitamin D deficiency    Wears glasses    Past Surgical History:  Procedure Laterality Date   ACHILLES TENDON SURGERY Left 06/2016   BIOPSY  11/25/2021   Procedure: BIOPSY;  Surgeon: Gatha Mayer, MD;  [facility];  Service: Endoscopy;;   BREAST BIOPSY Left 02/2016   benign   CARDIAC CATHETERIZATION     COLONOSCOPY  2005   COLONOSCOPY WITH PROPOFOL N/A 11/25/2021   Procedure: COLONOSCOPY WITH PROPOFOL;  Surgeon: Gatha Mayer, MD;  [facility];  Service: Endoscopy;   Laterality: N/A;   CT CTA CORONARY W/CA SCORE W/CM &/OR WO/CM  01/01/2014   non-obstructive calcified plaque in pLAD (0-25%), no significant incidental noncardiac findings noted   DILATATION & CURETTAGE/HYSTEROSCOPY WITH MYOSURE N/A 12/02/2020   Procedure: DILATATION & CURETTAGE/HYSTEROSCOPY WITH POLYPECTOMY AND REMOVAL OF VAGINAL LESION;  Surgeon: [name], MD;  Location: [place];  Service: Gynecology;  Laterality: N/A;  request to follow in [place] held (DR. Delilah Shan has two other cases that morning starting 7:30am)   ESOPHAGOGASTRODUODENOSCOPY  01/2014   EXTRACORPOREAL SHOCK WAVE LITHOTRIPSY  2010   KNEE ARTHROPLASTY Right 03/24/2018   Procedure: RIGHT TOTAL KNEE ARTHROPLASTY WITH COMPUTER NAVIGATION;  Surgeon: Rod Can, MD;  [facility];  Service: Orthopedics;  Laterality: Right;  Needs RNFA   KNEE ARTHROSCOPY Right 12/04/2016   Procedure: ARTHROSCOPY KNEE WITH PARTIAL MEDIAL MENISCECTOMY;  Surgeon: Rod Can, MD;  Location: [place] [facility];  Service: Orthopedics;  Laterality: Right;   LEFT HEART CATH AND CORONARY ANGIOGRAPHY N/A 11/20/2021   Procedure: LEFT HEART CATH AND CORONARY ANGIOGRAPHY;  Surgeon: Burnell Blanks, MD;  Location: [place] [facility];  Service: Cardiovascular;  Laterality: N/A;   LEFT HEART CATHETERIZATION WITH CORONARY ANGIOGRAM N/A 01/11/2012   Procedure: LEFT HEART  CATHETERIZATION WITH CORONARY ANGIOGRAM;  Surgeon: Sherren Mocha, MD;  Location: [facility];  Service: Cardiovascular;  Laterality: N/A;  widely patent coronary arterires without significant obstructive CAD,  normal LVF, ef 55-56%   TONSILLECTOMY AND ADENOIDECTOMY  child   TRANSTHORACIC ECHOCARDIOGRAM  01/18/2012   mild LVH,  ef 60%/  trivial TR   TUBAL LIGATION  1985   UPPER GASTROINTESTINAL ENDOSCOPY  last done 2019   Patient Active Problem List   Diagnosis Date Noted   Colon cancer screening    Mucosal abnormality of colon     Coronary artery disease involving native coronary artery of native heart without angina pectoris    Adnexal mass 10/25/2020   Elevated cancer antigen 125 (CA 125) 10/25/2020   Thickened endometrium 10/25/2020   Portal hypertensive gastropathy ([place]) 02/29/2020   Recurrent UTI 06/12/2019   Secondary insomnia 04/10/2019   Anemia 02/28/2019   Thrombocytopenia ([place]) 02/28/2019   Degeneration of lumbar intervertebral disc 08/06/2018   Secondary esophageal varices without bleeding ([place]) 06/03/2018   Encephalopathy, hepatic ([place]) 05/13/2018   History of paroxysmal supraventricular tachycardia    Liver cirrhosis secondary to NASH ([place]) 04/23/2018   Irritable bowel syndrome-diarrhea predominant 06/15/2017   Gastroesophageal reflux disease 06/15/2017   Hyperlipidemia    Kidney stone    Essential hypertension 02/13/2010    REFERRING DIAG:   Liver transplant    THERAPY DIAG:  Muscle weakness (generalized)  Rationale for Evaluation and Treatment Rehabilitation  PERTINENT HISTORY: Right TKA, Left Achilles surgery, HTN, OA  PRECAUTIONS:   5# lifting limit currently    SUBJECTIVE: Did okay after last Rx  PAIN:  Are you having pain? Sore abdomen     TODAY'S TREATMENT:                                     EXERCISE LOG  Exercise Repetitions and Resistance Comments  Nustep X20 mins L4-3 O2 96%, HR 97 after nustep  Seated: marching 3x10   Ball squeeze X10 hold 5 secs   Clams 3x10   LAQ's 2x10     HS curl  Red tband 2x15 each side   rockerboard PF/DF and balance x4 mins    Blank cell = exercise not performed today       HOME EXERCISE PROGRAM:      PT Long Term Goals - 06/22/22 1045       PT LONG TERM GOAL #1   Title Patient will be independent with her HEP.    Time 6      PT LONG TERM GOAL #2   Title Walk 500 feet without assisitive device and no rest stop.    Time 6    Period Weeks    Status New      PT LONG TERM GOAL #3   Title Increase bilateral LE  strength to 5/5 to provide good stability for accomplishment of functional activities.    Time 6    Period Weeks    Status New      PT LONG TERM GOAL #4   Title Perform normal ADL's independently.    Time 6    Period Weeks    Status New              Plan - 06/22/22 1041     Clinical Impression Statement Pt arrived today reporting doing fairly well after last Rx and was able to progress time and reps  with exs today. O2 and HR remained in the 90'S during Rx with with intermittent episodes SOB, but recovered well.    Personal Factors and Comorbidities Comorbidity 1;Comorbidity 2;Other    Comorbidities Right TKA, Left Achilles surgery, HTN, OA.    Examination-Activity Limitations Other;Locomotion Level    Examination-Participation Restrictions Other;Meal Prep    Stability/Clinical Decision Making Stable/Uncomplicated    Rehab Potential Excellent    PT Frequency 2x / week    PT Duration 6 weeks    PT Treatment/Interventions Therapeutic exercise;Therapeutic activities;Functional mobility training;Gait training;Neuromuscular re-education;Patient/family education;ADLs/Self Care Home Management    PT Next Visit Plan Nustep (please monitor 02 and HR), 5# weight limit.  Generalized conditioning and strengthening.  Gait activities.    Consulted and Agree with Plan of Care Patient               Izsak Meir,CHRIS, PTA 06/22/2022, 1:07 PM

## 2022-06-25 ENCOUNTER — Ambulatory Visit: Payer: Medicare Other | Admitting: *Deleted

## 2022-06-25 ENCOUNTER — Encounter: Payer: Self-pay | Admitting: *Deleted

## 2022-06-25 DIAGNOSIS — M6281 Muscle weakness (generalized): Secondary | ICD-10-CM

## 2022-06-25 NOTE — Therapy (Signed)
OUTPATIENT PHYSICAL THERAPY TREATMENT NOTE   Patient Name: Dawn Moon MRN: [id number] DOB:1956-01-22, 66 y.o., female Today's Date: 06/25/2022  REFERRING PROVIDER: Lottie Rater NP.   PT End of Session - 06/25/22 1107     Visit Number 4    Number of Visits 16    Date for PT Re-Evaluation 09/14/22    PT Start Time 1030    PT Stop Time 1120    PT Time Calculation (min) 50 min             Past Medical History:  Diagnosis Date   Ascites    Dysrhythmia    hx of SVT   Encephalopathy, hepatic ([place]) 05/13/2018   GERD (gastroesophageal reflux disease)    History of exercise stress test    03-07-2010  Stress echo--- no arrhythmias or conduction abnormalilites and negative for ischemia or chest pain   History of kidney stones    History of paroxysmal supraventricular tachycardia    episode 2011  consult w/ dr Caryl Comes --  put on atenolol--  per pt no longer an issue   HTN (hypertension)    cardiologist --  dr Stanford Breed   IBS (irritable bowel syndrome)    diarrhea   Nonalcoholic steatohepatitis (NASH)    Numbness and tingling of left lower extremity    post achilles tendon repair   OA (osteoarthritis)    knees   Pleural effusion on right    hepatic hydrothorax   PMB (postmenopausal bleeding) none recent   thickened endometrium   PONV (postoperative nausea and vomiting)    Recurrent UTI none since 2019   Secondary esophageal varices without bleeding ([place]) 06/03/2018   Vitamin D deficiency    Wears glasses    Past Surgical History:  Procedure Laterality Date   ACHILLES TENDON SURGERY Left 06/2016   BIOPSY  11/25/2021   Procedure: BIOPSY;  Surgeon: Gatha Mayer, MD;  [facility];  Service: Endoscopy;;   BREAST BIOPSY Left 02/2016   benign   CARDIAC CATHETERIZATION     COLONOSCOPY  2005   COLONOSCOPY WITH PROPOFOL N/A 11/25/2021   Procedure: COLONOSCOPY WITH PROPOFOL;  Surgeon: Gatha Mayer, MD;  [facility];  Service: Endoscopy;   Laterality: N/A;   CT CTA CORONARY W/CA SCORE W/CM &/OR WO/CM  01/01/2014   non-obstructive calcified plaque in pLAD (0-25%), no significant incidental noncardiac findings noted   DILATATION & CURETTAGE/HYSTEROSCOPY WITH MYOSURE N/A 12/02/2020   Procedure: DILATATION & CURETTAGE/HYSTEROSCOPY WITH POLYPECTOMY AND REMOVAL OF VAGINAL LESION;  Surgeon: [name], MD;  Location: [place];  Service: Gynecology;  Laterality: N/A;  request to follow in [place] held (DR. Delilah Shan has two other cases that morning starting 7:30am)   ESOPHAGOGASTRODUODENOSCOPY  01/2014   EXTRACORPOREAL SHOCK WAVE LITHOTRIPSY  2010   KNEE ARTHROPLASTY Right 03/24/2018   Procedure: RIGHT TOTAL KNEE ARTHROPLASTY WITH COMPUTER NAVIGATION;  Surgeon: Rod Can, MD;  [facility];  Service: Orthopedics;  Laterality: Right;  Needs RNFA   KNEE ARTHROSCOPY Right 12/04/2016   Procedure: ARTHROSCOPY KNEE WITH PARTIAL MEDIAL MENISCECTOMY;  Surgeon: Rod Can, MD;  Location: [place];  Service: Orthopedics;  Laterality: Right;   LEFT HEART CATH AND CORONARY ANGIOGRAPHY N/A 11/20/2021   Procedure: LEFT HEART CATH AND CORONARY ANGIOGRAPHY;  Surgeon: Burnell Blanks, MD;  Location: [place] [facility];  Service: Cardiovascular;  Laterality: N/A;   LEFT HEART CATHETERIZATION WITH CORONARY ANGIOGRAM N/A 01/11/2012   Procedure: LEFT HEART  CATHETERIZATION WITH CORONARY ANGIOGRAM;  Surgeon: Sherren Mocha, MD;  Location: [facility];  Service: Cardiovascular;  Laterality: N/A;  widely patent coronary arterires without significant obstructive CAD,  normal LVF, ef 55-56%   TONSILLECTOMY AND ADENOIDECTOMY  child   TRANSTHORACIC ECHOCARDIOGRAM  01/18/2012   mild LVH,  ef 60%/  trivial TR   TUBAL LIGATION  1985   UPPER GASTROINTESTINAL ENDOSCOPY  last done 2019   Patient Active Problem List   Diagnosis Date Noted   Colon cancer screening    Mucosal abnormality of colon     Coronary artery disease involving native coronary artery of native heart without angina pectoris    Adnexal mass 10/25/2020   Elevated cancer antigen 125 (CA 125) 10/25/2020   Thickened endometrium 10/25/2020   Portal hypertensive gastropathy ([place]) 02/29/2020   Recurrent UTI 06/12/2019   Secondary insomnia 04/10/2019   Anemia 02/28/2019   Thrombocytopenia ([place]) 02/28/2019   Degeneration of lumbar intervertebral disc 08/06/2018   Secondary esophageal varices without bleeding ([place]) 06/03/2018   Encephalopathy, hepatic ([place]) 05/13/2018   History of paroxysmal supraventricular tachycardia    Liver cirrhosis secondary to NASH ([place]) 04/23/2018   Irritable bowel syndrome-diarrhea predominant 06/15/2017   Gastroesophageal reflux disease 06/15/2017   Hyperlipidemia    Kidney stone    Essential hypertension 02/13/2010    REFERRING DIAG:   Liver transplant    THERAPY DIAG:  Muscle weakness (generalized)  Rationale for Evaluation and Treatment Rehabilitation  PERTINENT HISTORY: Right TKA, Left Achilles surgery, HTN, OA  PRECAUTIONS:   5# lifting limit currently    SUBJECTIVE: Did okay after last Rx  PAIN:  Are you having pain? Sore abdomen     TODAY'S TREATMENT:                                     EXERCISE LOG   06-25-22  Exercise Repetitions and Resistance Comments  Nustep X20 mins L4-3 O2 97%, HR 100 BPM after nustep  Seated: marching    Ball squeeze X15 hold 5 secs   Clams 3x10 Green tband   LAQ's 3x10  2# each LE Tolerated increased resistance well  HS curl  Red tband 2x15 each side   rockerboard PF/DF and balance x4 mins Pt needing less assistance for balance today   Blank cell = exercise not performed today       HOME EXERCISE PROGRAM:      PT Long Term Goals - 06/22/22 1045       PT LONG TERM GOAL #1   Title Patient will be independent with her HEP.    Time 6      PT LONG TERM GOAL #2   Title Walk 500 feet without assisitive device and no rest  stop.    Time 6    Period Weeks    Status New      PT LONG TERM GOAL #3   Title Increase bilateral LE strength to 5/5 to provide good stability for accomplishment of functional activities.    Time 6    Period Weeks    Status New      PT LONG TERM GOAL #4   Title Perform normal ADL's independently.    Time 6    Period Weeks    Status New              Plan - 06/22/22 1041     Clinical Impression Statement Pt  arrived today reporting doing fairly well after last Rx and was able to progress time and reps with exs today.Decreased UE assist on balance today and wt.'s added to LAQ's today with no complaints. O2 remained above 90% and HR reached 100 BPM end of nustep and toerated well   Personal Factors and Comorbidities Comorbidity 1;Comorbidity 2;Other    Comorbidities Right TKA, Left Achilles surgery, HTN, OA.    Examination-Activity Limitations Other;Locomotion Level    Examination-Participation Restrictions Other;Meal Prep    Stability/Clinical Decision Making Stable/Uncomplicated    Rehab Potential Excellent    PT Frequency 2x / week    PT Duration 6 weeks    PT Treatment/Interventions Therapeutic exercise;Therapeutic activities;Functional mobility training;Gait training;Neuromuscular re-education;Patient/family education;ADLs/Self Care Home Management    PT Next Visit Plan Nustep (please monitor 02 and HR), 5# weight limit.  Generalized conditioning and strengthening.  Gait activities.    Consulted and Agree with Plan of Care Patient               Isadora Delorey,CHRIS, PTA 06/25/2022, 11:36 AM

## 2022-06-30 ENCOUNTER — Encounter: Payer: Self-pay | Admitting: *Deleted

## 2022-06-30 ENCOUNTER — Ambulatory Visit: Payer: Medicare Other | Admitting: *Deleted

## 2022-06-30 DIAGNOSIS — M6281 Muscle weakness (generalized): Secondary | ICD-10-CM

## 2022-06-30 NOTE — Therapy (Signed)
OUTPATIENT PHYSICAL THERAPY TREATMENT NOTE   Patient Name: Dawn Moon MRN: [id number] DOB:1956/03/03, 66 y.o., female Today's Date: 06/30/2022  REFERRING PROVIDER: Lottie Rater NP.   PT End of Session - 06/30/22 1044     Visit Number 5    Number of Visits 16    Date for PT Re-Evaluation 09/14/22    PT Start Time 1031    PT Stop Time 1120    PT Time Calculation (min) 49 min             Past Medical History:  Diagnosis Date   Ascites    Dysrhythmia    hx of SVT   Encephalopathy, hepatic ([place]) 05/13/2018   GERD (gastroesophageal reflux disease)    History of exercise stress test    03-07-2010  Stress echo--- no arrhythmias or conduction abnormalilites and negative for ischemia or chest pain   History of kidney stones    History of paroxysmal supraventricular tachycardia    episode 2011  consult w/ dr Caryl Comes --  put on atenolol--  per pt no longer an issue   HTN (hypertension)    cardiologist --  dr Stanford Breed   IBS (irritable bowel syndrome)    diarrhea   Nonalcoholic steatohepatitis (NASH)    Numbness and tingling of left lower extremity    post achilles tendon repair   OA (osteoarthritis)    knees   Pleural effusion on right    hepatic hydrothorax   PMB (postmenopausal bleeding) none recent   thickened endometrium   PONV (postoperative nausea and vomiting)    Recurrent UTI none since 2019   Secondary esophageal varices without bleeding ([place]) 06/03/2018   Vitamin D deficiency    Wears glasses    Past Surgical History:  Procedure Laterality Date   ACHILLES TENDON SURGERY Left 06/2016   BIOPSY  11/25/2021   Procedure: BIOPSY;  Surgeon: Gatha Mayer, MD;  [facility];  Service: Endoscopy;;   BREAST BIOPSY Left 02/2016   benign   CARDIAC CATHETERIZATION     COLONOSCOPY  2005   COLONOSCOPY WITH PROPOFOL N/A 11/25/2021   Procedure: COLONOSCOPY WITH PROPOFOL;  Surgeon: Gatha Mayer, MD;  [facility];  Service: Endoscopy;   Laterality: N/A;   CT CTA CORONARY W/CA SCORE W/CM &/OR WO/CM  01/01/2014   non-obstructive calcified plaque in pLAD (0-25%), no significant incidental noncardiac findings noted   DILATATION & CURETTAGE/HYSTEROSCOPY WITH MYOSURE N/A 12/02/2020   Procedure: DILATATION & CURETTAGE/HYSTEROSCOPY WITH POLYPECTOMY AND REMOVAL OF VAGINAL LESION;  Surgeon: [name], MD;  Location: [place];  Service: Gynecology;  Laterality: N/A;  request to follow in [place] held (DR. Delilah Shan has two other cases that morning starting 7:30am)   ESOPHAGOGASTRODUODENOSCOPY  01/2014   EXTRACORPOREAL SHOCK WAVE LITHOTRIPSY  2010   KNEE ARTHROPLASTY Right 03/24/2018   Procedure: RIGHT TOTAL KNEE ARTHROPLASTY WITH COMPUTER NAVIGATION;  Surgeon: Rod Can, MD;  [facility];  Service: Orthopedics;  Laterality: Right;  Needs RNFA   KNEE ARTHROSCOPY Right 12/04/2016   Procedure: ARTHROSCOPY KNEE WITH PARTIAL MEDIAL MENISCECTOMY;  Surgeon: Rod Can, MD;  Location: [place];  Service: Orthopedics;  Laterality: Right;   LEFT HEART CATH AND CORONARY ANGIOGRAPHY N/A 11/20/2021   Procedure: LEFT HEART CATH AND CORONARY ANGIOGRAPHY;  Surgeon: Burnell Blanks, MD;  Location: [place] [facility];  Service: Cardiovascular;  Laterality: N/A;   LEFT HEART CATHETERIZATION WITH CORONARY ANGIOGRAM N/A 01/11/2012   Procedure: LEFT HEART  CATHETERIZATION WITH CORONARY ANGIOGRAM;  Surgeon: Sherren Mocha, MD;  Location: [facility];  Service: Cardiovascular;  Laterality: N/A;  widely patent coronary arterires without significant obstructive CAD,  normal LVF, ef 55-56%   TONSILLECTOMY AND ADENOIDECTOMY  child   TRANSTHORACIC ECHOCARDIOGRAM  01/18/2012   mild LVH,  ef 60%/  trivial TR   TUBAL LIGATION  1985   UPPER GASTROINTESTINAL ENDOSCOPY  last done 2019   Patient Active Problem List   Diagnosis Date Noted   Colon cancer screening    Mucosal abnormality of colon     Coronary artery disease involving native coronary artery of native heart without angina pectoris    Adnexal mass 10/25/2020   Elevated cancer antigen 125 (CA 125) 10/25/2020   Thickened endometrium 10/25/2020   Portal hypertensive gastropathy ([place]) 02/29/2020   Recurrent UTI 06/12/2019   Secondary insomnia 04/10/2019   Anemia 02/28/2019   Thrombocytopenia ([place]) 02/28/2019   Degeneration of lumbar intervertebral disc 08/06/2018   Secondary esophageal varices without bleeding ([place]) 06/03/2018   Encephalopathy, hepatic ([place]) 05/13/2018   History of paroxysmal supraventricular tachycardia    Liver cirrhosis secondary to NASH ([place]) 04/23/2018   Irritable bowel syndrome-diarrhea predominant 06/15/2017   Gastroesophageal reflux disease 06/15/2017   Hyperlipidemia    Kidney stone    Essential hypertension 02/13/2010    REFERRING DIAG:   Liver transplant    THERAPY DIAG:  Muscle weakness (generalized)  Rationale for Evaluation and Treatment Rehabilitation  PERTINENT HISTORY: Right TKA, Left Achilles surgery, HTN, OA  PRECAUTIONS:   5# lifting limit currently    SUBJECTIVE: Did okay after last Rx. Knee is hurting some today  PAIN:  Are you having pain? Sore abdomen and LT knee pain     TODAY'S TREATMENT:                                     EXERCISE LOG   06-30-22  Exercise Repetitions and Resistance Comments  Nustep X20 mins L4-3            1.07 miles O2 98%, HR 106 BPM after nustep  Seated: marching    Ball squeeze X20 hold 5 secs   Clams 3x10 Green tband   LAQ's 3x10  2# each LE Tolerated increased resistance well. RT knee patella tendon soreness/tender  HS curl     rockerboard PF/DF and balance x4 mins Pt needing less assistance for balance today  Balance beam X 6 mins total side stepping/ tandem walking        Blank cell = exercise not performed today       HOME EXERCISE PROGRAM:      PT Long Term Goals - 06/22/22 1045       PT LONG TERM GOAL #1    Title Patient will be independent with her HEP.    Time 6      PT LONG TERM GOAL #2   Title Walk 500 feet without assisitive device and no rest stop.    Time 6    Period Weeks    Status New      PT LONG TERM GOAL #3   Title Increase bilateral LE strength to 5/5 to provide good stability for accomplishment of functional activities.    Time 6    Period Weeks    Status New      PT LONG TERM GOAL #4   Title Perform normal ADL's independently.  Time 6    Period Weeks    Status New              Plan - 06/30/22 1041     Clinical Impression Statement Pt arrived today reporting doing fairly well after last Rx , but LT knee hurts. Rx focused on LE strengthening as well as balance act.'s today and Pt continues to progress with all exs, and enjoyed balance challenges today.O2 stayed above 90% today. HR up to 106 BPM.  Be mindful of LT patella tendon soreness during exs.   Personal Factors and Comorbidities Comorbidity 1;Comorbidity 2;Other    Comorbidities Right TKA, Left Achilles surgery, HTN, OA.    Examination-Activity Limitations Other;Locomotion Level    Examination-Participation Restrictions Other;Meal Prep    Stability/Clinical Decision Making Stable/Uncomplicated    Rehab Potential Excellent    PT Frequency 2x / week    PT Duration 6 weeks    PT Treatment/Interventions Therapeutic exercise;Therapeutic activities;Functional mobility training;Gait training;Neuromuscular re-education;Patient/family education;ADLs/Self Care Home Management    PT Next Visit Plan Nustep (please monitor 02 and HR), 5# weight limit.  Generalized conditioning and strengthening.  Gait activities.    Consulted and Agree with Plan of Care Patient               Leilene Diprima,CHRIS, PTA 06/30/2022, 1:13 PM

## 2022-07-02 ENCOUNTER — Ambulatory Visit: Payer: Medicare Other

## 2022-07-02 DIAGNOSIS — M6281 Muscle weakness (generalized): Secondary | ICD-10-CM

## 2022-07-02 NOTE — Therapy (Signed)
OUTPATIENT PHYSICAL THERAPY TREATMENT NOTE   Patient Name: Dawn Moon MRN: [id number] DOB:[birth year], 66 y.o., female Today's Date: 07/02/2022  REFERRING PROVIDER: Lottie Rater NP.     Past Medical History:  Diagnosis Date   Ascites    Dysrhythmia    hx of SVT   Encephalopathy, hepatic ([place]) 05/13/2018   GERD (gastroesophageal reflux disease)    History of exercise stress test    03-07-2010  Stress echo--- no arrhythmias or conduction abnormalilites and negative for ischemia or chest pain   History of kidney stones    History of paroxysmal supraventricular tachycardia    episode 2011  consult w/ dr Caryl Comes --  put on atenolol--  per pt no longer an issue   HTN (hypertension)    cardiologist --  dr Stanford Breed   IBS (irritable bowel syndrome)    diarrhea   Nonalcoholic steatohepatitis (NASH)    Numbness and tingling of left lower extremity    post achilles tendon repair   OA (osteoarthritis)    knees   Pleural effusion on right    hepatic hydrothorax   PMB (postmenopausal bleeding) none recent   thickened endometrium   PONV (postoperative nausea and vomiting)    Recurrent UTI none since 2019   Secondary esophageal varices without bleeding ([place]) 06/03/2018   Vitamin D deficiency    Wears glasses    Past Surgical History:  Procedure Laterality Date   ACHILLES TENDON SURGERY Left 06/2016   BIOPSY  11/25/2021   Procedure: BIOPSY;  Surgeon: Gatha Mayer, MD;  [facility];  Service: Endoscopy;;   BREAST BIOPSY Left 02/2016   benign   CARDIAC CATHETERIZATION     COLONOSCOPY  2005   COLONOSCOPY WITH PROPOFOL N/A 11/25/2021   Procedure: COLONOSCOPY WITH PROPOFOL;  Surgeon: Gatha Mayer, MD;  [facility];  Service: Endoscopy;  Laterality: N/A;   CT CTA CORONARY W/CA SCORE W/CM &/OR WO/CM  01/01/2014   non-obstructive calcified plaque in pLAD (0-25%), no significant incidental noncardiac findings noted   DILATATION &  CURETTAGE/HYSTEROSCOPY WITH MYOSURE N/A 12/02/2020   Procedure: DILATATION & CURETTAGE/HYSTEROSCOPY WITH POLYPECTOMY AND REMOVAL OF VAGINAL LESION;  Surgeon: [name], MD;  Location: [place];  Service: Gynecology;  Laterality: N/A;  request to follow in [place] held (DR. Delilah Shan has two other cases that morning starting 7:30am)   ESOPHAGOGASTRODUODENOSCOPY  01/2014   EXTRACORPOREAL SHOCK WAVE LITHOTRIPSY  2010   KNEE ARTHROPLASTY Right 03/24/2018   Procedure: RIGHT TOTAL KNEE ARTHROPLASTY WITH COMPUTER NAVIGATION;  Surgeon: Rod Can, MD;  [facility];  Service: Orthopedics;  Laterality: Right;  Needs RNFA   KNEE ARTHROSCOPY Right 12/04/2016   Procedure: ARTHROSCOPY KNEE WITH PARTIAL MEDIAL MENISCECTOMY;  Surgeon: Rod Can, MD;  Location: [place];  Service: Orthopedics;  Laterality: Right;   LEFT HEART CATH AND CORONARY ANGIOGRAPHY N/A 11/20/2021   Procedure: LEFT HEART CATH AND CORONARY ANGIOGRAPHY;  Surgeon: Burnell Blanks, MD;  Location: [place] [facility];  Service: Cardiovascular;  Laterality: N/A;   LEFT HEART CATHETERIZATION WITH CORONARY ANGIOGRAM N/A 01/11/2012   Procedure: LEFT HEART CATHETERIZATION WITH CORONARY ANGIOGRAM;  Surgeon: Sherren Mocha, MD;  Location: [facility];  Service: Cardiovascular;  Laterality: N/A;  widely patent coronary arterires without significant obstructive CAD,  normal LVF, ef 55-56%   TONSILLECTOMY AND ADENOIDECTOMY  child   TRANSTHORACIC ECHOCARDIOGRAM  01/18/2012   mild LVH,  ef 60%/  trivial TR   TUBAL LIGATION  1985   UPPER GASTROINTESTINAL ENDOSCOPY  last done 2019   Patient Active Problem List   Diagnosis Date Noted   Colon cancer screening    Mucosal abnormality of colon    Coronary artery disease involving native coronary artery of native heart without angina pectoris    Adnexal mass 10/25/2020   Elevated cancer antigen 125 (CA 125) 10/25/2020   Thickened  endometrium 10/25/2020   Portal hypertensive gastropathy ([place]) 02/29/2020   Recurrent UTI 06/12/2019   Secondary insomnia 04/10/2019   Anemia 02/28/2019   Thrombocytopenia ([place]) 02/28/2019   Degeneration of lumbar intervertebral disc 08/06/2018   Secondary esophageal varices without bleeding ([place]) 06/03/2018   Encephalopathy, hepatic ([place]) 05/13/2018   History of paroxysmal supraventricular tachycardia    Liver cirrhosis secondary to NASH ([place]) 04/23/2018   Irritable bowel syndrome-diarrhea predominant 06/15/2017   Gastroesophageal reflux disease 06/15/2017   Hyperlipidemia    Kidney stone    Essential hypertension 02/13/2010    REFERRING DIAG:   Liver transplant    THERAPY DIAG:  No diagnosis found.  Rationale for Evaluation and Treatment Rehabilitation  PERTINENT HISTORY: Right TKA, Left Achilles surgery, HTN, OA  PRECAUTIONS:   5# lifting limit currently    SUBJECTIVE: Patient reports that she feels alright today.   PAIN:  Are you having pain? Yes: NPRS scale: 3/10 Pain location: left knee       TODAY'S TREATMENT:                                    7/13 EXERCISE LOG  Exercise Repetitions and Resistance Comments  Nustep  L4-3 x 15 minutes  Limited by fatigue  Heel / toe raises  2 minutes   LAQ 3 lbs x 3 minutes; alternating LE    Seated marching  3 lbs x 3 x 1 minutes; alternating LE   Slouch / overcorrect  Seated; 30 reps    Seated scapular retraction 30 reps    Seated hip ADD isometric 5 second hold x 2 minutes    Blank cell = exercise not performed today   EDUCATION:  Healing, progress with therapy, safely walking in her community  Patient verbalized understanding                                    EXERCISE LOG   06-30-22  Exercise Repetitions and Resistance Comments  Nustep X20 mins L4-3            1.07 miles O2 98%, HR 106 BPM after nustep  Seated: marching    Ball squeeze X20 hold 5 secs   Clams 3x10 Green tband   LAQ's 3x10  2# each LE  Tolerated increased resistance well. RT knee patella tendon soreness/tender  HS curl     rockerboard PF/DF and balance x4 mins Pt needing less assistance for balance today  Balance beam X 6 mins total side stepping/ tandem walking        Blank cell = exercise not performed today       HOME EXERCISE PROGRAM:      PT Long Term Goals - 06/22/22 1045       PT LONG TERM GOAL #1   Title Patient will be independent with her HEP.    Time 6      PT LONG TERM GOAL #2   Title Walk 500  feet without assisitive device and no rest stop.    Time 6    Period Weeks    Status New      PT LONG TERM GOAL #3   Title Increase bilateral LE strength to 5/5 to provide good stability for accomplishment of functional activities.    Time 6    Period Weeks    Status New      PT LONG TERM GOAL #4   Title Perform normal ADL's independently.    Time 6    Period Weeks    Status New              Plan - 06/30/22 1041     Clinical Impression Statement Treatment was limited by increased fatigue and a headache she developed prior to treatment. Treatment focused on familiar interventions for improved lower extremity strength needed for functional activities. Fatigue was her primary limitation with today's interventions. She required cueing throughout treatment to take breaks throughout to prevent a significant increase in fatigue and avoid altered movement patterns. She continues to require skilled physical therapy to address her remaining impairments to maximize her functional mobility.    Personal Factors and Comorbidities Comorbidity 1;Comorbidity 2;Other    Comorbidities Right TKA, Left Achilles surgery, HTN, OA.    Examination-Activity Limitations Other;Locomotion Level    Examination-Participation Restrictions Other;Meal Prep    Stability/Clinical Decision Making Stable/Uncomplicated    Rehab Potential Excellent    PT Frequency 2x / week    PT Duration 6 weeks    PT Treatment/Interventions  Therapeutic exercise;Therapeutic activities;Functional mobility training;Gait training;Neuromuscular re-education;Patient/family education;ADLs/Self Care Home Management    PT Next Visit Plan Nustep (please monitor 02 and HR), 5# weight limit.  Generalized conditioning and strengthening.  Gait activities.    Consulted and Agree with Plan of Care Patient               Darlin Coco, PT 07/02/2022, 9:23 AM

## 2022-07-07 ENCOUNTER — Ambulatory Visit: Payer: Medicare Other | Admitting: *Deleted

## 2022-07-07 DIAGNOSIS — M6281 Muscle weakness (generalized): Secondary | ICD-10-CM | POA: Diagnosis not present

## 2022-07-07 NOTE — Therapy (Signed)
OUTPATIENT PHYSICAL THERAPY TREATMENT NOTE   Patient Name: Dawn Moon MRN: [id number] DOB:26-Sep-1956, 66 y.o., female Today's Date: 07/07/2022  REFERRING PROVIDER: Lottie Rater NP.   PT End of Session - 07/07/22 1033     Visit Number 7    Number of Visits 16    Date for PT Re-Evaluation 09/14/22    PT Start Time 1030    PT Stop Time 1120    PT Time Calculation (min) 50 min              Past Medical History:  Diagnosis Date   Ascites    Dysrhythmia    hx of SVT   Encephalopathy, hepatic ([place]) 05/13/2018   GERD (gastroesophageal reflux disease)    History of exercise stress test    03-07-2010  Stress echo--- no arrhythmias or conduction abnormalilites and negative for ischemia or chest pain   History of kidney stones    History of paroxysmal supraventricular tachycardia    episode 2011  consult w/ dr Caryl Comes --  put on atenolol--  per pt no longer an issue   HTN (hypertension)    cardiologist --  dr Stanford Breed   IBS (irritable bowel syndrome)    diarrhea   Nonalcoholic steatohepatitis (NASH)    Numbness and tingling of left lower extremity    post achilles tendon repair   OA (osteoarthritis)    knees   Pleural effusion on right    hepatic hydrothorax   PMB (postmenopausal bleeding) none recent   thickened endometrium   PONV (postoperative nausea and vomiting)    Recurrent UTI none since 2019   Secondary esophageal varices without bleeding ([place]) 06/03/2018   Vitamin D deficiency    Wears glasses    Past Surgical History:  Procedure Laterality Date   ACHILLES TENDON SURGERY Left 06/2016   BIOPSY  11/25/2021   Procedure: BIOPSY;  Surgeon: Gatha Mayer, MD;  [facility];  Service: Endoscopy;;   BREAST BIOPSY Left 02/2016   benign   CARDIAC CATHETERIZATION     COLONOSCOPY  2005   COLONOSCOPY WITH PROPOFOL N/A 11/25/2021   Procedure: COLONOSCOPY WITH PROPOFOL;  Surgeon: Gatha Mayer, MD;  [facility];  Service: Endoscopy;   Laterality: N/A;   CT CTA CORONARY W/CA SCORE W/CM &/OR WO/CM  01/01/2014   non-obstructive calcified plaque in pLAD (0-25%), no significant incidental noncardiac findings noted   DILATATION & CURETTAGE/HYSTEROSCOPY WITH MYOSURE N/A 12/02/2020   Procedure: DILATATION & CURETTAGE/HYSTEROSCOPY WITH POLYPECTOMY AND REMOVAL OF VAGINAL LESION;  Surgeon: [name], MD;  Location: [place];  Service: Gynecology;  Laterality: N/A;  request to follow in [place] held (DR. Delilah Shan has two other cases that morning starting 7:30am)   ESOPHAGOGASTRODUODENOSCOPY  01/2014   EXTRACORPOREAL SHOCK WAVE LITHOTRIPSY  2010   KNEE ARTHROPLASTY Right 03/24/2018   Procedure: RIGHT TOTAL KNEE ARTHROPLASTY WITH COMPUTER NAVIGATION;  Surgeon: Rod Can, MD;  [facility];  Service: Orthopedics;  Laterality: Right;  Needs RNFA   KNEE ARTHROSCOPY Right 12/04/2016   Procedure: ARTHROSCOPY KNEE WITH PARTIAL MEDIAL MENISCECTOMY;  Surgeon: Rod Can, MD;  Location: [place];  Service: Orthopedics;  Laterality: Right;   LEFT HEART CATH AND CORONARY ANGIOGRAPHY N/A 11/20/2021   Procedure: LEFT HEART CATH AND CORONARY ANGIOGRAPHY;  Surgeon: Burnell Blanks, MD;  Location: [place] [facility];  Service: Cardiovascular;  Laterality: N/A;   LEFT HEART CATHETERIZATION WITH CORONARY ANGIOGRAM N/A 01/11/2012   Procedure: LEFT  HEART CATHETERIZATION WITH CORONARY ANGIOGRAM;  Surgeon: Sherren Mocha, MD;  Location: [facility] C[facility] LAB;  Service: Cardiovascular;  Laterality: N/A;  widely patent coronary arterires without significant obstructive CAD,  normal LVF, ef 55-56%   TONSILLECTOMY AND ADENOIDECTOMY  child   TRANSTHORACIC ECHOCARDIOGRAM  01/18/2012   mild LVH,  ef 60%/  trivial TR   TUBAL LIGATION  1985   UPPER GASTROINTESTINAL ENDOSCOPY  last done 2019   Patient Active Problem List   Diagnosis Date Noted   Colon cancer screening    Mucosal abnormality of colon     Coronary artery disease involving native coronary artery of native heart without angina pectoris    Adnexal mass 10/25/2020   Elevated cancer antigen 125 (CA 125) 10/25/2020   Thickened endometrium 10/25/2020   Portal hypertensive gastropathy ([place]) 02/29/2020   Recurrent UTI 06/12/2019   Secondary insomnia 04/10/2019   Anemia 02/28/2019   Thrombocytopenia ([place]) 02/28/2019   Degeneration of lumbar intervertebral disc 08/06/2018   Secondary esophageal varices without bleeding ([place]) 06/03/2018   Encephalopathy, hepatic ([place]) 05/13/2018   History of paroxysmal supraventricular tachycardia    Liver cirrhosis secondary to NASH ([place]) 04/23/2018   Irritable bowel syndrome-diarrhea predominant 06/15/2017   Gastroesophageal reflux disease 06/15/2017   Hyperlipidemia    Kidney stone    Essential hypertension 02/13/2010    REFERRING DIAG:   Liver transplant    THERAPY DIAG:  Muscle weakness (generalized)  Rationale for Evaluation and Treatment Rehabilitation  PERTINENT HISTORY: Right TKA, Left Achilles surgery, HTN, OA  PRECAUTIONS:   5# lifting limit currently    SUBJECTIVE: Patient reports that she feels alright today. Having pain in LT shldr and shooting pains into RT hand.  PAIN:  Are you having pain? Yes: NPRS scale: 3/10 Pain location: left knee       TODAY'S TREATMENT:                                    7/18 EXERCISE LOG  Exercise Repetitions and Resistance Comments  Nustep  L4-3 x 15 minutes  Limited by fatigue  O2 97 % and HR  98 BPM after ex  Heel / toe raises  2 minutes   LAQ 3 lbs x 3 minutes; alternating LE    Seated marching  3 lbs x 3 x 1 minutes; alternating LE   Slouch / overcorrect  Seated; 30 reps    Seated scapular retraction 30 reps    Seated hip ADD isometric 5 second hold x 2 minutes    Blank cell = exercise not performed today   EDUCATION:                                      EXERCISE LOG   06-30-22  Exercise Repetitions and Resistance  Comments  Nustep X20 mins L4-3            1.07 miles O2 98%, HR 97 BPM after nustep  Seated: marching    Ball squeeze X20 hold 5 secs   Clams 4x10 Green tband   LAQ's 3x10  2# each LE   HS curl  Green tband 3x10 each side   rockerboard PF/DF and balance x4 mins Pt needing less assistance for balance today  Balance beam         Blank cell = exercise not performed today  HOME EXERCISE PROGRAM:      PT Long Term Goals - 06/22/22 1045       PT LONG TERM GOAL #1   Title Patient will be independent with her HEP.    Time 6      PT LONG TERM GOAL #2   Title Walk 500 feet without assisitive device and no rest stop.    Time 6    Period Weeks    Status New      PT LONG TERM GOAL #3   Title Increase bilateral LE strength to 5/5 to provide good stability for accomplishment of functional activities.    Time 6    Period Weeks    Status New      PT LONG TERM GOAL #4   Title Perform normal ADL's independently.    Time 6    Period Weeks    Status New              Plan - 07/07/22 1041     Clinical Impression Statement Pt arrived today doing fairly well with more energy, but is now having sharp pains in her RT arm.She was able to perform more exercises today than last Rx and HR and O2 were both 95-100 during/after exs.        Personal Factors and Comorbidities Comorbidity 1;Comorbidity 2;Other    Comorbidities Right TKA, Left Achilles surgery, HTN, OA.    Examination-Activity Limitations Other;Locomotion Level    Examination-Participation Restrictions Other;Meal Prep    Stability/Clinical Decision Making Stable/Uncomplicated    Rehab Potential Excellent    PT Frequency 2x / week    PT Duration 6 weeks    PT Treatment/Interventions Therapeutic exercise;Therapeutic activities;Functional mobility training;Gait training;Neuromuscular re-education;Patient/family education;ADLs/Self Care Home Management    PT Next Visit Plan Nustep (please monitor 02 and HR), 5# weight  limit.  Generalized conditioning and strengthening.  Gait activities.    Consulted and Agree with Plan of Care Patient               Thurman Sarver,CHRIS, PTA 07/07/2022, 11:40 AM

## 2022-07-10 ENCOUNTER — Ambulatory Visit: Payer: Medicare Other

## 2022-07-10 DIAGNOSIS — M6281 Muscle weakness (generalized): Secondary | ICD-10-CM | POA: Diagnosis not present

## 2022-07-10 NOTE — Therapy (Signed)
OUTPATIENT PHYSICAL THERAPY TREATMENT NOTE   Patient Name: Dawn Moon MRN: [id number] DOB:1956/11/29, 66 y.o., female Today's Date: 07/10/2022  REFERRING PROVIDER: Lottie Rater NP.   PT End of Session - 07/10/22 0949     Visit Number 8    Number of Visits 16    Date for PT Re-Evaluation 09/14/22    PT Start Time 0945              Past Medical History:  Diagnosis Date   Ascites    Dysrhythmia    hx of SVT   Encephalopathy, hepatic ([place]) 05/13/2018   GERD (gastroesophageal reflux disease)    History of exercise stress test    03-07-2010  Stress echo--- no arrhythmias or conduction abnormalilites and negative for ischemia or chest pain   History of kidney stones    History of paroxysmal supraventricular tachycardia    episode 2011  consult w/ dr Caryl Comes --  put on atenolol--  per pt no longer an issue   HTN (hypertension)    cardiologist --  dr Stanford Breed   IBS (irritable bowel syndrome)    diarrhea   Nonalcoholic steatohepatitis (NASH)    Numbness and tingling of left lower extremity    post achilles tendon repair   OA (osteoarthritis)    knees   Pleural effusion on right    hepatic hydrothorax   PMB (postmenopausal bleeding) none recent   thickened endometrium   PONV (postoperative nausea and vomiting)    Recurrent UTI none since 2019   Secondary esophageal varices without bleeding ([place]) 06/03/2018   Vitamin D deficiency    Wears glasses    Past Surgical History:  Procedure Laterality Date   ACHILLES TENDON SURGERY Left 06/2016   BIOPSY  11/25/2021   Procedure: BIOPSY;  Surgeon: Gatha Mayer, MD;  [facility];  Service: Endoscopy;;   BREAST BIOPSY Left 02/2016   benign   CARDIAC CATHETERIZATION     COLONOSCOPY  2005   COLONOSCOPY WITH PROPOFOL N/A 11/25/2021   Procedure: COLONOSCOPY WITH PROPOFOL;  Surgeon: Gatha Mayer, MD;  [facility];  Service: Endoscopy;  Laterality: N/A;   CT CTA CORONARY W/CA SCORE W/CM &/OR  WO/CM  01/01/2014   non-obstructive calcified plaque in pLAD (0-25%), no significant incidental noncardiac findings noted   DILATATION & CURETTAGE/HYSTEROSCOPY WITH MYOSURE N/A 12/02/2020   Procedure: DILATATION & CURETTAGE/HYSTEROSCOPY WITH POLYPECTOMY AND REMOVAL OF VAGINAL LESION;  Surgeon: [name], MD;  Location: [place];  Service: Gynecology;  Laterality: N/A;  request to follow in [place] held (DR. Delilah Shan has two other cases that morning starting 7:30am)   ESOPHAGOGASTRODUODENOSCOPY  01/2014   EXTRACORPOREAL SHOCK WAVE LITHOTRIPSY  2010   KNEE ARTHROPLASTY Right 03/24/2018   Procedure: RIGHT TOTAL KNEE ARTHROPLASTY WITH COMPUTER NAVIGATION;  Surgeon: Rod Can, MD;  [facility];  Service: Orthopedics;  Laterality: Right;  Needs RNFA   KNEE ARTHROSCOPY Right 12/04/2016   Procedure: ARTHROSCOPY KNEE WITH PARTIAL MEDIAL MENISCECTOMY;  Surgeon: Rod Can, MD;  Location: [place];  Service: Orthopedics;  Laterality: Right;   LEFT HEART CATH AND CORONARY ANGIOGRAPHY N/A 11/20/2021   Procedure: LEFT HEART CATH AND CORONARY ANGIOGRAPHY;  Surgeon: Burnell Blanks, MD;  Location: [place] [facility];  Service: Cardiovascular;  Laterality: N/A;   LEFT HEART CATHETERIZATION WITH CORONARY ANGIOGRAM N/A 01/11/2012   Procedure: LEFT HEART CATHETERIZATION WITH CORONARY ANGIOGRAM;  Surgeon: Sherren Mocha, MD;  Location: [facility];  Service: Cardiovascular;  Laterality: N/A;  widely patent coronary arterires without significant obstructive CAD,  normal LVF, ef 55-56%   LIVER TRANSPLANTATION  06/03/2022   TONSILLECTOMY AND ADENOIDECTOMY  child   TRANSTHORACIC ECHOCARDIOGRAM  01/18/2012   mild LVH,  ef 60%/  trivial TR   TUBAL LIGATION  1985   UPPER GASTROINTESTINAL ENDOSCOPY  last done 2019   Patient Active Problem List   Diagnosis Date Noted   Colon cancer screening    Mucosal abnormality of colon    Coronary artery  disease involving native coronary artery of native heart without angina pectoris    Adnexal mass 10/25/2020   Elevated cancer antigen 125 (CA 125) 10/25/2020   Thickened endometrium 10/25/2020   Portal hypertensive gastropathy ([place]) 02/29/2020   Recurrent UTI 06/12/2019   Secondary insomnia 04/10/2019   Anemia 02/28/2019   Thrombocytopenia ([place]) 02/28/2019   Degeneration of lumbar intervertebral disc 08/06/2018   Secondary esophageal varices without bleeding ([place]) 06/03/2018   Encephalopathy, hepatic ([place]) 05/13/2018   History of paroxysmal supraventricular tachycardia    Liver cirrhosis secondary to NASH ([place]) 04/23/2018   Irritable bowel syndrome-diarrhea predominant 06/15/2017   Gastroesophageal reflux disease 06/15/2017   Hyperlipidemia    Kidney stone    Essential hypertension 02/13/2010    REFERRING DIAG:   Liver transplant    THERAPY DIAG:  Muscle weakness (generalized)  Rationale for Evaluation and Treatment Rehabilitation  PERTINENT HISTORY: Right TKA, Left Achilles surgery, HTN, OA  PRECAUTIONS:   5# lifting limit currently    SUBJECTIVE: Patient reports that she feels alright today. Having pain in LT shldr and shooting pains into RT hand.  PAIN:  Are you having pain? Yes: NPRS scale: 3/10 Pain location: left knee       TODAY'S TREATMENT:                               7/21      EXERCISE LOG  Exercise Repetitions and Resistance Comments  Nustep L4 - 3 x 20 mins   Heel/toe raises 3 mins   LAQ 4# x 25 reps each   Seated Marching 4# x 25 reps each   Ham curls Green tband x 25 reps   Seated Hip Abduction Green tband x 3 mins   Ball Squeezes 5 sec hold x 3 mins   STS    Seated scap retraction     Blank cell = exercise not performed today                                         7/18 EXERCISE LOG  Exercise Repetitions and Resistance Comments  Nustep  L4-3 x 15 minutes  Limited by fatigue  O2 97 % and HR  98 BPM after ex  Heel / toe raises  2  minutes   LAQ 3 lbs x 3 minutes; alternating LE    Seated marching  3 lbs x 3 x 1 minutes; alternating LE   Slouch / overcorrect  Seated; 30 reps    Seated scapular retraction 30 reps    Seated hip ADD isometric 5 second hold x 2 minutes    Blank cell = exercise not performed today     HOME EXERCISE PROGRAM:      PT Long Term Goals - 06/22/22 1045       PT LONG  TERM GOAL #1   Title Patient will be independent with her HEP.    Time 6      PT LONG TERM GOAL #2   Title Walk 500 feet without assisitive device and no rest stop.    Time 6    Period Weeks    Status New      PT LONG TERM GOAL #3   Title Increase bilateral LE strength to 5/5 to provide good stability for accomplishment of functional activities.    Time 6    Period Weeks    Status New      PT LONG TERM GOAL #4   Title Perform normal ADL's independently.    Time 6    Period Weeks    Status New              Plan - 07/07/22 1041     Clinical Impression Statement Pt arrives for today's treatment session denying any pain.  Pt states that her left arm and wrist is feeling much better.  Pt able to tolerate increased time and resistance with all seated exercises today.  Pt requiring minimal rest breaks today.  Pt denied any pain at the completion of today's treatment session.     Personal Factors and Comorbidities Comorbidity 1;Comorbidity 2;Other    Comorbidities Right TKA, Left Achilles surgery, HTN, OA.    Examination-Activity Limitations Other;Locomotion Level    Examination-Participation Restrictions Other;Meal Prep    Stability/Clinical Decision Making Stable/Uncomplicated    Rehab Potential Excellent    PT Frequency 2x / week    PT Duration 6 weeks    PT Treatment/Interventions Therapeutic exercise;Therapeutic activities;Functional mobility training;Gait training;Neuromuscular re-education;Patient/family education;ADLs/Self Care Home Management    PT Next Visit Plan Nustep (please monitor 02 and HR),  5# weight limit.  Generalized conditioning and strengthening.  Gait activities.    Consulted and Agree with Plan of Care Patient               Kathrynn Ducking, PTA 07/10/2022, 9:50 AM

## 2022-07-14 ENCOUNTER — Encounter: Payer: Self-pay | Admitting: *Deleted

## 2022-07-14 ENCOUNTER — Ambulatory Visit: Payer: Medicare Other | Admitting: *Deleted

## 2022-07-14 DIAGNOSIS — M6281 Muscle weakness (generalized): Secondary | ICD-10-CM

## 2022-07-14 NOTE — Therapy (Signed)
OUTPATIENT PHYSICAL THERAPY TREATMENT NOTE   Patient Name: Sonji Starkes MRN: [id number] DOB:06-Jan-1956, 66 y.o., female Today's Date: 07/14/2022  REFERRING PROVIDER: Lottie Rater NP.   PT End of Session - 07/14/22 0859     Visit Number 9    Number of Visits 16    Date for PT Re-Evaluation 09/14/22    PT Start Time 0900    PT Stop Time 2800    PT Time Calculation (min) 50 min              Past Medical History:  Diagnosis Date   Ascites    Dysrhythmia    hx of SVT   Encephalopathy, hepatic (HCC) 05/13/2018   GERD (gastroesophageal reflux disease)    History of exercise stress test    03-07-2010  Stress echo--- no arrhythmias or conduction abnormalilites and negative for ischemia or chest pain   History of kidney stones    History of paroxysmal supraventricular tachycardia    episode 2011  consult w/ dr Caryl Comes --  put on atenolol--  per pt no longer an issue   HTN (hypertension)    cardiologist --  dr Stanford Breed   IBS (irritable bowel syndrome)    diarrhea   Nonalcoholic steatohepatitis (NASH)    Numbness and tingling of left lower extremity    post achilles tendon repair   OA (osteoarthritis)    knees   Pleural effusion on right    hepatic hydrothorax   PMB (postmenopausal bleeding) none recent   thickened endometrium   PONV (postoperative nausea and vomiting)    Recurrent UTI none since 2019   Secondary esophageal varices without bleeding ([place]) 06/03/2018   Vitamin D deficiency    Wears glasses    Past Surgical History:  Procedure Laterality Date   ACHILLES TENDON SURGERY Left 06/2016   BIOPSY  11/25/2021   Procedure: BIOPSY;  Surgeon: Gatha Mayer, MD;  [facility];  Service: Endoscopy;;   BREAST BIOPSY Left 02/2016   benign   CARDIAC CATHETERIZATION     COLONOSCOPY  2005   COLONOSCOPY WITH PROPOFOL N/A 11/25/2021   Procedure: COLONOSCOPY WITH PROPOFOL;  Surgeon: Gatha Mayer, MD;  [facility];  Service: Endoscopy;   Laterality: N/A;   CT CTA CORONARY W/CA SCORE W/CM &/OR WO/CM  01/01/2014   non-obstructive calcified plaque in pLAD (0-25%), no significant incidental noncardiac findings noted   DILATATION & CURETTAGE/HYSTEROSCOPY WITH MYOSURE N/A 12/02/2020   Procedure: DILATATION & CURETTAGE/HYSTEROSCOPY WITH POLYPECTOMY AND REMOVAL OF VAGINAL LESION;  Surgeon: [name], MD;  Location: [place];  Service: Gynecology;  Laterality: N/A;  request to follow in [place] held (DR. Delilah Shan has two other cases that morning starting 7:30am)   ESOPHAGOGASTRODUODENOSCOPY  01/2014   EXTRACORPOREAL SHOCK WAVE LITHOTRIPSY  2010   KNEE ARTHROPLASTY Right 03/24/2018   Procedure: RIGHT TOTAL KNEE ARTHROPLASTY WITH COMPUTER NAVIGATION;  Surgeon: Rod Can, MD;  [facility];  Service: Orthopedics;  Laterality: Right;  Needs RNFA   KNEE ARTHROSCOPY Right 12/04/2016   Procedure: ARTHROSCOPY KNEE WITH PARTIAL MEDIAL MENISCECTOMY;  Surgeon: Rod Can, MD;  Location: [place];  Service: Orthopedics;  Laterality: Right;   LEFT HEART CATH AND CORONARY ANGIOGRAPHY N/A 11/20/2021   Procedure: LEFT HEART CATH AND CORONARY ANGIOGRAPHY;  Surgeon: Burnell Blanks, MD;  Location: [place] [facility];  Service: Cardiovascular;  Laterality: N/A;   LEFT HEART CATHETERIZATION WITH CORONARY ANGIOGRAM N/A 01/11/2012   Procedure: LEFT  HEART CATHETERIZATION WITH CORONARY ANGIOGRAM;  Surgeon: Sherren Mocha, MD;  Location: [facility];  Service: Cardiovascular;  Laterality: N/A;  widely patent coronary arterires without significant obstructive CAD,  normal LVF, ef 55-56%   LIVER TRANSPLANTATION  06/03/2022   TONSILLECTOMY AND ADENOIDECTOMY  child   TRANSTHORACIC ECHOCARDIOGRAM  01/18/2012   mild LVH,  ef 60%/  trivial TR   TUBAL LIGATION  1985   UPPER GASTROINTESTINAL ENDOSCOPY  last done 2019   Patient Active Problem List   Diagnosis Date Noted   Colon cancer  screening    Mucosal abnormality of colon    Coronary artery disease involving native coronary artery of native heart without angina pectoris    Adnexal mass 10/25/2020   Elevated cancer antigen 125 (CA 125) 10/25/2020   Thickened endometrium 10/25/2020   Portal hypertensive gastropathy ([place]) 02/29/2020   Recurrent UTI 06/12/2019   Secondary insomnia 04/10/2019   Anemia 02/28/2019   Thrombocytopenia ([place]) 02/28/2019   Degeneration of lumbar intervertebral disc 08/06/2018   Secondary esophageal varices without bleeding ([place]) 06/03/2018   Encephalopathy, hepatic ([place]) 05/13/2018   History of paroxysmal supraventricular tachycardia    Liver cirrhosis secondary to NASH ([place]) 04/23/2018   Irritable bowel syndrome-diarrhea predominant 06/15/2017   Gastroesophageal reflux disease 06/15/2017   Hyperlipidemia    Kidney stone    Essential hypertension 02/13/2010    REFERRING DIAG:   Liver transplant    THERAPY DIAG:  Muscle weakness (generalized)  Rationale for Evaluation and Treatment Rehabilitation  PERTINENT HISTORY: Right TKA, Left Achilles surgery, HTN, OA  PRECAUTIONS:   5# lifting limit currently    SUBJECTIVE: Patient reports that she had a good report from f/u at [facility]. Feel good today  PAIN:  Are you having pain? Yes: NPRS scale: 3/10 Pain location: left knee       TODAY'S TREATMENT:                               7/25    EXERCISE LOG  Exercise Repetitions and Resistance Comments  Nustep L4 - 3 x 20 mins O2 99% and HR 103 BPM after nustep  Heel and toe raises 3x fatigue each set   LAQ 4# x 25 reps eachLE pause at top  Try 5#s next visit  Seated Marching    Ham curls Green tband x 25 reps each LE   Seated Hip Abduction    Warehouse manager board X 4 mins   Seated scap retraction    Step ups 6in box 3x10 Bil LEs    Blank cell = exercise not performed today                                         7/18 EXERCISE LOG  Exercise Repetitions and  Resistance Comments  Nustep  L4-3 x 15 minutes  Limited by fatigue  O2 97 % and HR  98 BPM after ex  Heel / toe raises  2 minutes   LAQ 3 lbs x 3 minutes; alternating LE    Seated marching  3 lbs x 3 x 1 minutes; alternating LE   Slouch / overcorrect  Seated; 30 reps    Seated scapular retraction 30 reps    Seated hip ADD isometric 5 second hold x 2 minutes    Blank cell =  exercise not performed today     HOME EXERCISE PROGRAM:      PT Long Term Goals - 06/22/22 1045       PT LONG TERM GOAL #1   Title Patient will be independent with her HEP.    Time 6      PT LONG TERM GOAL #2   Title Walk 500 feet without assisitive device and no rest stop.    Time 6    Period Weeks    Status ongoing     PT LONG TERM GOAL #3   Title Increase bilateral LE strength to 5/5 to provide good stability for accomplishment of functional activities.    Time 6    Period Weeks    Status ongoing     PT LONG TERM GOAL #4   Title Perform normal ADL's independently.    Time 6    Period Weeks    Status Ongoing\             Plan - 07/14/22 1041     Clinical Impression Statement Pt arrives today in good spirits after f/u at [facility].  Pt was able to progress today with some standing step ups and did well. O2% and HR parameters were great today as well. Assess walking LTG next visit.    Personal Factors and Comorbidities Comorbidity 1;Comorbidity 2;Other    Comorbidities Right TKA, Left Achilles surgery, HTN, OA.    Examination-Activity Limitations Other;Locomotion Level    Examination-Participation Restrictions Other;Meal Prep    Stability/Clinical Decision Making Stable/Uncomplicated    Rehab Potential Excellent    PT Frequency 2x / week    PT Duration 6 weeks    PT Treatment/Interventions Therapeutic exercise;Therapeutic activities;Functional mobility training;Gait training;Neuromuscular re-education;Patient/family education;ADLs/Self Care Home Management    PT Next Visit Plan Nustep (please  monitor 02 and HR), 5# weight limit.  Generalized conditioning and strengthening.  Gait activities.    Assess walking time toward LTG.   Consulted and Agree with Plan of Care Patient               Domino Holten,CHRIS, PTA 07/14/2022, 11:14 AM

## 2022-07-17 ENCOUNTER — Encounter: Payer: Self-pay | Admitting: Physical Therapy

## 2022-07-17 ENCOUNTER — Ambulatory Visit: Payer: Medicare Other | Admitting: Physical Therapy

## 2022-07-17 DIAGNOSIS — M6281 Muscle weakness (generalized): Secondary | ICD-10-CM

## 2022-07-17 NOTE — Therapy (Signed)
OUTPATIENT PHYSICAL THERAPY TREATMENT NOTE   Patient Name: Dawn Moon MRN: [id number] DOB:1956/04/29, 66 y.o., female Today's Date: 07/17/2022  REFERRING PROVIDER: Lottie Rater NP.   PT End of Session - 07/17/22 1043     Visit Number 10    Number of Visits 16    Date for PT Re-Evaluation 09/14/22    PT Start Time 1032    PT Stop Time 1120    PT Time Calculation (min) 48 min    Activity Tolerance Patient limited by fatigue    Behavior During Therapy [facility] for tasks assessed/performed              Past Medical History:  Diagnosis Date   Ascites    Dysrhythmia    hx of SVT   Encephalopathy, hepatic ([place]) 05/13/2018   GERD (gastroesophageal reflux disease)    History of exercise stress test    03-07-2010  Stress echo--- no arrhythmias or conduction abnormalilites and negative for ischemia or chest pain   History of kidney stones    History of paroxysmal supraventricular tachycardia    episode 2011  consult w/ dr Caryl Comes --  put on atenolol--  per pt no longer an issue   HTN (hypertension)    cardiologist --  dr Stanford Breed   IBS (irritable bowel syndrome)    diarrhea   Nonalcoholic steatohepatitis (NASH)    Numbness and tingling of left lower extremity    post achilles tendon repair   OA (osteoarthritis)    knees   Pleural effusion on right    hepatic hydrothorax   PMB (postmenopausal bleeding) none recent   thickened endometrium   PONV (postoperative nausea and vomiting)    Recurrent UTI none since 2019   Secondary esophageal varices without bleeding ([place]) 06/03/2018   Vitamin D deficiency    Wears glasses    Past Surgical History:  Procedure Laterality Date   ACHILLES TENDON SURGERY Left 06/2016   BIOPSY  11/25/2021   Procedure: BIOPSY;  Surgeon: Gatha Mayer, MD;  [facility];  Service: Endoscopy;;   BREAST BIOPSY Left 02/2016   benign   CARDIAC CATHETERIZATION     COLONOSCOPY  2005   COLONOSCOPY WITH PROPOFOL N/A 11/25/2021    Procedure: COLONOSCOPY WITH PROPOFOL;  Surgeon: Gatha Mayer, MD;  [facility];  Service: Endoscopy;  Laterality: N/A;   CT CTA CORONARY W/CA SCORE W/CM &/OR WO/CM  01/01/2014   non-obstructive calcified plaque in pLAD (0-25%), no significant incidental noncardiac findings noted   DILATATION & CURETTAGE/HYSTEROSCOPY WITH MYOSURE N/A 12/02/2020   Procedure: DILATATION & CURETTAGE/HYSTEROSCOPY WITH POLYPECTOMY AND REMOVAL OF VAGINAL LESION;  Surgeon: [name], MD;  Location: [place];  Service: Gynecology;  Laterality: N/A;  request to follow in [place] held (DR. Delilah Shan has two other cases that morning starting 7:30am)   ESOPHAGOGASTRODUODENOSCOPY  01/2014   EXTRACORPOREAL SHOCK WAVE LITHOTRIPSY  2010   KNEE ARTHROPLASTY Right 03/24/2018   Procedure: RIGHT TOTAL KNEE ARTHROPLASTY WITH COMPUTER NAVIGATION;  Surgeon: Rod Can, MD;  [facility];  Service: Orthopedics;  Laterality: Right;  Needs RNFA   KNEE ARTHROSCOPY Right 12/04/2016   Procedure: ARTHROSCOPY KNEE WITH PARTIAL MEDIAL MENISCECTOMY;  Surgeon: Rod Can, MD;  Location: [place];  Service: Orthopedics;  Laterality: Right;   LEFT HEART CATH AND CORONARY ANGIOGRAPHY N/A 11/20/2021   Procedure: LEFT HEART CATH AND CORONARY ANGIOGRAPHY;  Surgeon: Burnell Blanks, MD;  Location: [place] [facility];  Service: Cardiovascular;  Laterality: N/A;   LEFT HEART CATHETERIZATION WITH CORONARY ANGIOGRAM N/A 01/11/2012   Procedure: LEFT HEART CATHETERIZATION WITH CORONARY ANGIOGRAM;  Surgeon: Sherren Mocha, MD;  Location: [facility];  Service: Cardiovascular;  Laterality: N/A;  widely patent coronary arterires without significant obstructive CAD,  normal LVF, ef 55-56%   LIVER TRANSPLANTATION  06/03/2022   TONSILLECTOMY AND ADENOIDECTOMY  child   TRANSTHORACIC ECHOCARDIOGRAM  01/18/2012   mild LVH,  ef 60%/  trivial TR   TUBAL LIGATION  1985   UPPER  GASTROINTESTINAL ENDOSCOPY  last done 2019   Patient Active Problem List   Diagnosis Date Noted   Colon cancer screening    Mucosal abnormality of colon    Coronary artery disease involving native coronary artery of native heart without angina pectoris    Adnexal mass 10/25/2020   Elevated cancer antigen 125 (CA 125) 10/25/2020   Thickened endometrium 10/25/2020   Portal hypertensive gastropathy ([place]) 02/29/2020   Recurrent UTI 06/12/2019   Secondary insomnia 04/10/2019   Anemia 02/28/2019   Thrombocytopenia ([place]) 02/28/2019   Degeneration of lumbar intervertebral disc 08/06/2018   Secondary esophageal varices without bleeding ([place]) 06/03/2018   Encephalopathy, hepatic ([place]) 05/13/2018   History of paroxysmal supraventricular tachycardia    Liver cirrhosis secondary to NASH ([place]) 04/23/2018   Irritable bowel syndrome-diarrhea predominant 06/15/2017   Gastroesophageal reflux disease 06/15/2017   Hyperlipidemia    Kidney stone    Essential hypertension 02/13/2010    REFERRING DIAG:   Liver transplant    THERAPY DIAG:  Muscle weakness (generalized)  Rationale for Evaluation and Treatment Rehabilitation  PERTINENT HISTORY: Right TKA, Left Achilles surgery, HTN, OA  PRECAUTIONS:   5# lifting limit currently    SUBJECTIVE: Feel good today  PAIN:  Are you having pain? Yes: NPRS scale: 3/10 Pain location: left knee       TODAY'S TREATMENT:                               7/28    EXERCISE LOG  Exercise Repetitions and Resistance Comments  Nustep L4 x 20 mins (1 mile)   Heel and toe raises 3x fatigue each set   LAQ 5# 2x15 reps reps each    Seated clam Green x20 reps   Ham curls Green tband x 25 reps each LE   Step ups 6in box 3x10 Bil LEs 98%, 90 bpm after step ups   Blank cell = exercise not performed today                    HOME EXERCISE PROGRAM:     PT Long Term Goals - 06/22/22 1045       PT LONG TERM GOAL #1   Title Patient will be independent with  her HEP.    Time 6      PT LONG TERM GOAL #2   Title Walk 500 feet without assisitive device and no rest stop.    Time 6    Period Weeks    Status Achieved     PT LONG TERM GOAL #3   Title Increase bilateral LE strength to 5/5 to provide good stability for accomplishment of functional activities.    Time 6    Period Weeks    Status ongoing     PT LONG TERM GOAL #4   Title Perform normal ADL's independently.    Time 6    Period  Weeks    Status Achieved             Plan - 07/14/22 1041     Clinical Impression Statement Patient arrived in clinic with no complaints and feeling good. Patient able to complete therex and increase reps and resistance. Patient very fatigued with standing steps and conversation which required a seated rest break.    Personal Factors and Comorbidities Comorbidity 1;Comorbidity 2;Other    Comorbidities Right TKA, Left Achilles surgery, HTN, OA.    Examination-Activity Limitations Other;Locomotion Level    Examination-Participation Restrictions Other;Meal Prep    Stability/Clinical Decision Making Stable/Uncomplicated    Rehab Potential Excellent    PT Frequency 2x / week    PT Duration 6 weeks    PT Treatment/Interventions Therapeutic exercise;Therapeutic activities;Functional mobility training;Gait training;Neuromuscular re-education;Patient/family education;ADLs/Self Care Home Management    PT Next Visit Plan Nustep (please monitor 02 and HR), 5# weight limit.  Generalized conditioning and strengthening.  Gait activities.    Assess walking time toward LTG.   Consulted and Agree with Plan of Care Patient               Standley Brooking, PTA 07/17/2022, 12:28 PM

## 2022-07-20 ENCOUNTER — Encounter: Payer: Self-pay | Admitting: Physical Therapy

## 2022-07-20 ENCOUNTER — Ambulatory Visit: Payer: Medicare Other | Admitting: Physical Therapy

## 2022-07-20 DIAGNOSIS — M6281 Muscle weakness (generalized): Secondary | ICD-10-CM

## 2022-07-20 NOTE — Therapy (Signed)
OUTPATIENT PHYSICAL THERAPY TREATMENT NOTE   Patient Name: Dawn Moon MRN: [id number] DOB:1956-01-28, 66 y.o., female Today's Date: 07/20/2022  REFERRING PROVIDER: Lottie Rater NP.   PT End of Session - 07/20/22 0910     Visit Number 11    Number of Visits 16    Date for PT Re-Evaluation 09/14/22    PT Start Time 0822    PT Stop Time 0906    PT Time Calculation (min) 44 min    Activity Tolerance Patient limited by fatigue    Behavior During Therapy [facility] for tasks assessed/performed              Past Medical History:  Diagnosis Date   Ascites    Dysrhythmia    hx of SVT   Encephalopathy, hepatic ([place]) 05/13/2018   GERD (gastroesophageal reflux disease)    History of exercise stress test    03-07-2010  Stress echo--- no arrhythmias or conduction abnormalilites and negative for ischemia or chest pain   History of kidney stones    History of paroxysmal supraventricular tachycardia    episode 2011  consult w/ dr Caryl Comes --  put on atenolol--  per pt no longer an issue   HTN (hypertension)    cardiologist --  dr Stanford Breed   IBS (irritable bowel syndrome)    diarrhea   Nonalcoholic steatohepatitis (NASH)    Numbness and tingling of left lower extremity    post achilles tendon repair   OA (osteoarthritis)    knees   Pleural effusion on right    hepatic hydrothorax   PMB (postmenopausal bleeding) none recent   thickened endometrium   PONV (postoperative nausea and vomiting)    Recurrent UTI none since 2019   Secondary esophageal varices without bleeding ([place]) 06/03/2018   Vitamin D deficiency    Wears glasses    Past Surgical History:  Procedure Laterality Date   ACHILLES TENDON SURGERY Left 06/2016   BIOPSY  11/25/2021   Procedure: BIOPSY;  Surgeon: Gatha Mayer, MD;  [facility];  Service: Endoscopy;;   BREAST BIOPSY Left 02/2016   benign   CARDIAC CATHETERIZATION     COLONOSCOPY  2005   COLONOSCOPY WITH PROPOFOL N/A 11/25/2021    Procedure: COLONOSCOPY WITH PROPOFOL;  Surgeon: Gatha Mayer, MD;  [facility];  Service: Endoscopy;  Laterality: N/A;   CT CTA CORONARY W/CA SCORE W/CM &/OR WO/CM  01/01/2014   non-obstructive calcified plaque in pLAD (0-25%), no significant incidental noncardiac findings noted   DILATATION & CURETTAGE/HYSTEROSCOPY WITH MYOSURE N/A 12/02/2020   Procedure: DILATATION & CURETTAGE/HYSTEROSCOPY WITH POLYPECTOMY AND REMOVAL OF VAGINAL LESION;  Surgeon: [name], MD;  Location: [place];  Service: Gynecology;  Laterality: N/A;  request to follow in [place] held (DR. Delilah Shan has two other cases that morning starting 7:30am)   ESOPHAGOGASTRODUODENOSCOPY  01/2014   EXTRACORPOREAL SHOCK WAVE LITHOTRIPSY  2010   KNEE ARTHROPLASTY Right 03/24/2018   Procedure: RIGHT TOTAL KNEE ARTHROPLASTY WITH COMPUTER NAVIGATION;  Surgeon: Rod Can, MD;  [facility];  Service: Orthopedics;  Laterality: Right;  Needs RNFA   KNEE ARTHROSCOPY Right 12/04/2016   Procedure: ARTHROSCOPY KNEE WITH PARTIAL MEDIAL MENISCECTOMY;  Surgeon: Rod Can, MD;  Location: [place];  Service: Orthopedics;  Laterality: Right;   LEFT HEART CATH AND CORONARY ANGIOGRAPHY N/A 11/20/2021   Procedure: LEFT HEART CATH AND CORONARY ANGIOGRAPHY;  Surgeon: Burnell Blanks, MD;  Location: [place] [facility];  Service: Cardiovascular;  Laterality: N/A;   LEFT HEART CATHETERIZATION WITH CORONARY ANGIOGRAM N/A 01/11/2012   Procedure: LEFT HEART CATHETERIZATION WITH CORONARY ANGIOGRAM;  Surgeon: Sherren Mocha, MD;  Location: [facility] CATH LAB;  Service: Cardiovascular;  Laterality: N/A;  widely patent coronary arterires without significant obstructive CAD,  normal LVF, ef 55-56%   LIVER TRANSPLANTATION  06/03/2022   TONSILLECTOMY AND ADENOIDECTOMY  child   TRANSTHORACIC ECHOCARDIOGRAM  01/18/2012   mild LVH,  ef 60%/  trivial TR   TUBAL LIGATION  1985   UPPER  GASTROINTESTINAL ENDOSCOPY  last done 2019   Patient Active Problem List   Diagnosis Date Noted   Colon cancer screening    Mucosal abnormality of colon    Coronary artery disease involving native coronary artery of native heart without angina pectoris    Adnexal mass 10/25/2020   Elevated cancer antigen 125 (CA 125) 10/25/2020   Thickened endometrium 10/25/2020   Portal hypertensive gastropathy ([place]) 02/29/2020   Recurrent UTI 06/12/2019   Secondary insomnia 04/10/2019   Anemia 02/28/2019   Thrombocytopenia ([place]) 02/28/2019   Degeneration of lumbar intervertebral disc 08/06/2018   Secondary esophageal varices without bleeding ([place]) 06/03/2018   Encephalopathy, hepatic ([place]) 05/13/2018   History of paroxysmal supraventricular tachycardia    Liver cirrhosis secondary to NASH ([place]) 04/23/2018   Irritable bowel syndrome-diarrhea predominant 06/15/2017   Gastroesophageal reflux disease 06/15/2017   Hyperlipidemia    Kidney stone    Essential hypertension 02/13/2010    REFERRING DIAG:   Liver transplant    THERAPY DIAG:  Muscle weakness (generalized)  Rationale for Evaluation and Treatment Rehabilitation  PERTINENT HISTORY: Right TKA, Left Achilles surgery, HTN, OA  PRECAUTIONS:   5# lifting limit currently    SUBJECTIVE: Feel good today  PAIN:  Are you having pain? Yes: NPRS scale: 3/10 Pain location: left knee       TODAY'S TREATMENT:                               7/28    EXERCISE LOG  Exercise Repetitions and Resistance Comments  Nustep L4 x 20 mins (1 mile)   Knee ext 10# x 4 mins    Ham curls 30# x 4 minutes    Inverted BOSU  with UE support as needed x 4 minutes.    Rockerboard board DF/PF x 3 minutes and side to side x 3 minutes.         Blank cell = exercise not performed today                    HOME EXERCISE PROGRAM:     PT Long Term Goals - 06/22/22 1045       PT LONG TERM GOAL #1   Title Patient will be independent with her HEP.    Time  6      PT LONG TERM GOAL #2   Title Walk 500 feet without assisitive device and no rest stop.    Time 6    Period Weeks    Status Achieved     PT LONG TERM GOAL #3   Title Increase bilateral LE strength to 5/5 to provide good stability for accomplishment of functional activities.    Time 6    Period Weeks    Status ongoing     PT LONG TERM GOAL #4   Title Perform normal ADL's independently.    Time 6  Period Weeks    Status Achieved             Plan - 07/20/22 1041     Clinical Impression Statement Excellent job today with the addition of light weight resistance machines.  Excellent technique and no complaints.    Personal Factors and Comorbidities Comorbidity 1;Comorbidity 2;Other    Comorbidities Right TKA, Left Achilles surgery, HTN, OA.    Examination-Activity Limitations Other;Locomotion Level    Examination-Participation Restrictions Other;Meal Prep    Stability/Clinical Decision Making Stable/Uncomplicated    Rehab Potential Excellent    PT Frequency 2x / week    PT Duration 6 weeks    PT Treatment/Interventions Therapeutic exercise;Therapeutic activities;Functional mobility training;Gait training;Neuromuscular re-education;Patient/family education;ADLs/Self Care Home Management    PT Next Visit Plan Nustep (please monitor 02 and HR), 5# weight limit.  Generalized conditioning and strengthening.  Gait activities.    Assess walking time toward LTG.   Consulted and Agree with Plan of Care Patient               Lacara Dunsworth, [place], PT 07/20/2022, 9:37 AM

## 2022-07-23 ENCOUNTER — Encounter: Payer: Medicare Other | Admitting: Physical Therapy

## 2022-07-28 ENCOUNTER — Encounter: Payer: Self-pay | Admitting: *Deleted

## 2022-07-28 ENCOUNTER — Ambulatory Visit: Payer: Medicare Other | Attending: Family | Admitting: *Deleted

## 2022-07-28 DIAGNOSIS — M6281 Muscle weakness (generalized): Secondary | ICD-10-CM | POA: Diagnosis present

## 2022-07-28 NOTE — Therapy (Signed)
OUTPATIENT PHYSICAL THERAPY TREATMENT NOTE   Patient Name: Dawn Moon MRN: [id number] DOB:[birth year], 66 y.o., female Today's Date: 07/28/2022  REFERRING PROVIDER: Lottie Rater NP.   PT End of Session - 07/28/22 1039     Visit Number 13    Number of Visits 16    Date for PT Re-Evaluation 09/14/22    PT Start Time 1030    PT Stop Time 1120    PT Time Calculation (min) 50 min              Past Medical History:  Diagnosis Date   Ascites    Dysrhythmia    hx of SVT   Encephalopathy, hepatic ([place]) 05/13/2018   GERD (gastroesophageal reflux disease)    History of exercise stress test    03-07-2010  Stress echo--- no arrhythmias or conduction abnormalilites and negative for ischemia or chest pain   History of kidney stones    History of paroxysmal supraventricular tachycardia    episode 2011  consult w/ dr Caryl Comes --  put on atenolol--  per pt no longer an issue   HTN (hypertension)    cardiologist --  dr Stanford Breed   IBS (irritable bowel syndrome)    diarrhea   Nonalcoholic steatohepatitis (NASH)    Numbness and tingling of left lower extremity    post achilles tendon repair   OA (osteoarthritis)    knees   Pleural effusion on right    hepatic hydrothorax   PMB (postmenopausal bleeding) none recent   thickened endometrium   PONV (postoperative nausea and vomiting)    Recurrent UTI none since 2019   Secondary esophageal varices without bleeding ([place]) 06/03/2018   Vitamin D deficiency    Wears glasses    Past Surgical History:  Procedure Laterality Date   ACHILLES TENDON SURGERY Left 06/2016   BIOPSY  11/25/2021   Procedure: BIOPSY;  Surgeon: Gatha Mayer, MD;  [facility];  Service: Endoscopy;;   BREAST BIOPSY Left 02/2016   benign   CARDIAC CATHETERIZATION     COLONOSCOPY  2005   COLONOSCOPY WITH PROPOFOL N/A 11/25/2021   Procedure: COLONOSCOPY WITH PROPOFOL;  Surgeon: Gatha Mayer, MD;  [facility];  Service: Endoscopy;   Laterality: N/A;   CT CTA CORONARY W/CA SCORE W/CM &/OR WO/CM  01/01/2014   non-obstructive calcified plaque in pLAD (0-25%), no significant incidental noncardiac findings noted   DILATATION & CURETTAGE/HYSTEROSCOPY WITH MYOSURE N/A 12/02/2020   Procedure: DILATATION & CURETTAGE/HYSTEROSCOPY WITH POLYPECTOMY AND REMOVAL OF VAGINAL LESION;  Surgeon: [name], MD;  Location: [place];  Service: Gynecology;  Laterality: N/A;  request to follow in [place] held (DR. Delilah Shan has two other cases that morning starting 7:30am)   ESOPHAGOGASTRODUODENOSCOPY  01/2014   EXTRACORPOREAL SHOCK WAVE LITHOTRIPSY  2010   KNEE ARTHROPLASTY Right 03/24/2018   Procedure: RIGHT TOTAL KNEE ARTHROPLASTY WITH COMPUTER NAVIGATION;  Surgeon: Rod Can, MD;  [facility];  Service: Orthopedics;  Laterality: Right;  Needs RNFA   KNEE ARTHROSCOPY Right 12/04/2016   Procedure: ARTHROSCOPY KNEE WITH PARTIAL MEDIAL MENISCECTOMY;  Surgeon: Rod Can, MD;  Location: [place];  Service: Orthopedics;  Laterality: Right;   LEFT HEART CATH AND CORONARY ANGIOGRAPHY N/A 11/20/2021   Procedure: LEFT HEART CATH AND CORONARY ANGIOGRAPHY;  Surgeon: Burnell Blanks, MD;  Location: [place] [facility];  Service: Cardiovascular;  Laterality: N/A;   LEFT HEART CATHETERIZATION WITH CORONARY ANGIOGRAM N/A 01/11/2012   Procedure: LEFT  HEART CATHETERIZATION WITH CORONARY ANGIOGRAM;  Surgeon: Sherren Mocha, MD;  Location: [facility];  Service: Cardiovascular;  Laterality: N/A;  widely patent coronary arterires without significant obstructive CAD,  normal LVF, ef 55-56%   LIVER TRANSPLANTATION  06/03/2022   TONSILLECTOMY AND ADENOIDECTOMY  child   TRANSTHORACIC ECHOCARDIOGRAM  01/18/2012   mild LVH,  ef 60%/  trivial TR   TUBAL LIGATION  1985   UPPER GASTROINTESTINAL ENDOSCOPY  last done 2019   Patient Active Problem List   Diagnosis Date Noted   Colon cancer  screening    Mucosal abnormality of colon    Coronary artery disease involving native coronary artery of native heart without angina pectoris    Adnexal mass 10/25/2020   Elevated cancer antigen 125 (CA 125) 10/25/2020   Thickened endometrium 10/25/2020   Portal hypertensive gastropathy ([place]) 02/29/2020   Recurrent UTI 06/12/2019   Secondary insomnia 04/10/2019   Anemia 02/28/2019   Thrombocytopenia ([place]) 02/28/2019   Degeneration of lumbar intervertebral disc 08/06/2018   Secondary esophageal varices without bleeding ([place]) 06/03/2018   Encephalopathy, hepatic ([place]) 05/13/2018   History of paroxysmal supraventricular tachycardia    Liver cirrhosis secondary to NASH ([place]) 04/23/2018   Irritable bowel syndrome-diarrhea predominant 06/15/2017   Gastroesophageal reflux disease 06/15/2017   Hyperlipidemia    Kidney stone    Essential hypertension 02/13/2010    REFERRING DIAG:   Liver transplant    THERAPY DIAG:  Muscle weakness (generalized)  Rationale for Evaluation and Treatment Rehabilitation  PERTINENT HISTORY: Right TKA, Left Achilles surgery, HTN, OA  PRECAUTIONS:   5# lifting limit currently    SUBJECTIVE: Feel good today  PAIN:  Are you having pain? Yes: NPRS scale: 3/10 Pain location: left knee       TODAY'S TREATMENT:                               07/28/22   EXERCISE LOG  Exercise Repetitions and Resistance Comments  Nustep L4 x 20 mins (1 mile) O2 97% and HR 107 BPM after  Knee ext  10# x 4 min   Ham curls  30# x 4 minutes   Inverted BOSU  with UE support as needed  x 5 minutes.   Rockerboard board  DF/PF x 3 minutes and side to side x 3 minutes.        Blank cell = exercise not performed today                    HOME EXERCISE PROGRAM:     PT Long Term Goals - 06/22/22 1045       PT LONG TERM GOAL #1   Title Patient will be independent with her HEP.    Time 6      PT LONG TERM GOAL #2   Title Walk 500 feet without assisitive device and no  rest stop.    Time 6    Period Weeks    Status Achieved     PT LONG TERM GOAL #3   Title Increase bilateral LE strength to 5/5 to provide good stability for accomplishment of functional activities.    Time 6    Period Weeks    Status ongoing     PT LONG TERM GOAL #4   Title Perform normal ADL's independently.    Time 6    Period Weeks    Status Achieved  Plan - 07/28/22 1041     Clinical Impression Statement Pt arrived today doing fairly well and was able to continue with strengthening and balance exs and Act.'s and well. O2 remained above 95%.   Personal Factors and Comorbidities Comorbidity 1;Comorbidity 2;Other    Comorbidities Right TKA, Left Achilles surgery, HTN, OA.    Examination-Activity Limitations Other;Locomotion Level    Examination-Participation Restrictions Other;Meal Prep    Stability/Clinical Decision Making Stable/Uncomplicated    Rehab Potential Excellent    PT Frequency 2x / week    PT Duration 6 weeks    PT Treatment/Interventions Therapeutic exercise;Therapeutic activities;Functional mobility training;Gait training;Neuromuscular re-education;Patient/family education;ADLs/Self Care Home Management    PT Next Visit Plan Nustep (please monitor 02 and HR), 5# weight limit.  Generalized conditioning and strengthening.  Gait activities.    Assess walking time toward LTG.   Consulted and Agree with Plan of Care Patient               Marne Meline,CHRIS, PTA 07/28/2022, 11:43 AM

## 2022-07-30 ENCOUNTER — Ambulatory Visit: Payer: Medicare Other | Admitting: *Deleted

## 2022-07-30 ENCOUNTER — Encounter: Payer: Self-pay | Admitting: *Deleted

## 2022-07-30 DIAGNOSIS — M6281 Muscle weakness (generalized): Secondary | ICD-10-CM

## 2022-07-30 NOTE — Therapy (Signed)
OUTPATIENT PHYSICAL THERAPY TREATMENT NOTE   Patient Name: Dawn Moon MRN: [id number] DOB:1956-05-04, 66 y.o., female Today's Date: 07/30/2022  REFERRING PROVIDER: Lottie Rater NP.   PT End of Session - 07/30/22 1035     Visit Number 14    Number of Visits 16    Date for PT Re-Evaluation 09/14/22    PT Start Time 1030              Past Medical History:  Diagnosis Date   Ascites    Dysrhythmia    hx of SVT   Encephalopathy, hepatic ([place]) 05/13/2018   GERD (gastroesophageal reflux disease)    History of exercise stress test    03-07-2010  Stress echo--- no arrhythmias or conduction abnormalilites and negative for ischemia or chest pain   History of kidney stones    History of paroxysmal supraventricular tachycardia    episode 2011  consult w/ dr Caryl Comes --  put on atenolol--  per pt no longer an issue   HTN (hypertension)    cardiologist --  dr Stanford Breed   IBS (irritable bowel syndrome)    diarrhea   Nonalcoholic steatohepatitis (NASH)    Numbness and tingling of left lower extremity    post achilles tendon repair   OA (osteoarthritis)    knees   Pleural effusion on right    hepatic hydrothorax   PMB (postmenopausal bleeding) none recent   thickened endometrium   PONV (postoperative nausea and vomiting)    Recurrent UTI none since 2019   Secondary esophageal varices without bleeding ([place]) 06/03/2018   Vitamin D deficiency    Wears glasses    Past Surgical History:  Procedure Laterality Date   ACHILLES TENDON SURGERY Left 06/2016   BIOPSY  11/25/2021   Procedure: BIOPSY;  Surgeon: Gatha Mayer, MD;  [facility];  Service: Endoscopy;;   BREAST BIOPSY Left 02/2016   benign   CARDIAC CATHETERIZATION     COLONOSCOPY  2005   COLONOSCOPY WITH PROPOFOL N/A 11/25/2021   Procedure: COLONOSCOPY WITH PROPOFOL;  Surgeon: Gatha Mayer, MD;  [facility];  Service: Endoscopy;  Laterality: N/A;   CT CTA CORONARY W/CA SCORE W/CM &/OR  WO/CM  01/01/2014   non-obstructive calcified plaque in pLAD (0-25%), no significant incidental noncardiac findings noted   DILATATION & CURETTAGE/HYSTEROSCOPY WITH MYOSURE N/A 12/02/2020   Procedure: DILATATION & CURETTAGE/HYSTEROSCOPY WITH POLYPECTOMY AND REMOVAL OF VAGINAL LESION;  Surgeon: [name], MD;  Location: [place];  Service: Gynecology;  Laterality: N/A;  request to follow in [place] held (DR. Delilah Shan has two other cases that morning starting 7:30am)   ESOPHAGOGASTRODUODENOSCOPY  01/2014   EXTRACORPOREAL SHOCK WAVE LITHOTRIPSY  2010   KNEE ARTHROPLASTY Right 03/24/2018   Procedure: RIGHT TOTAL KNEE ARTHROPLASTY WITH COMPUTER NAVIGATION;  Surgeon: Rod Can, MD;  [facility];  Service: Orthopedics;  Laterality: Right;  Needs RNFA   KNEE ARTHROSCOPY Right 12/04/2016   Procedure: ARTHROSCOPY KNEE WITH PARTIAL MEDIAL MENISCECTOMY;  Surgeon: Rod Can, MD;  Location: [place];  Service: Orthopedics;  Laterality: Right;   LEFT HEART CATH AND CORONARY ANGIOGRAPHY N/A 11/20/2021   Procedure: LEFT HEART CATH AND CORONARY ANGIOGRAPHY;  Surgeon: Burnell Blanks, MD;  Location: [place] [facility];  Service: Cardiovascular;  Laterality: N/A;   LEFT HEART CATHETERIZATION WITH CORONARY ANGIOGRAM N/A 01/11/2012   Procedure: LEFT HEART CATHETERIZATION WITH CORONARY ANGIOGRAM;  Surgeon: Sherren Mocha, MD;  [facility];  Service: Cardiovascular;  Laterality: N/A;  widely patent coronary arterires without significant obstructive CAD,  normal LVF, ef 55-56%   LIVER TRANSPLANTATION  06/03/2022   TONSILLECTOMY AND ADENOIDECTOMY  child   TRANSTHORACIC ECHOCARDIOGRAM  01/18/2012   mild LVH,  ef 60%/  trivial TR   TUBAL LIGATION  1985   UPPER GASTROINTESTINAL ENDOSCOPY  last done 2019   Patient Active Problem List   Diagnosis Date Noted   Colon cancer screening    Mucosal abnormality of colon    Coronary artery  disease involving native coronary artery of native heart without angina pectoris    Adnexal mass 10/25/2020   Elevated cancer antigen 125 (CA 125) 10/25/2020   Thickened endometrium 10/25/2020   Portal hypertensive gastropathy ([place]) 02/29/2020   Recurrent UTI 06/12/2019   Secondary insomnia 04/10/2019   Anemia 02/28/2019   Thrombocytopenia ([place]) 02/28/2019   Degeneration of lumbar intervertebral disc 08/06/2018   Secondary esophageal varices without bleeding ([place]) 06/03/2018   Encephalopathy, hepatic ([place]) 05/13/2018   History of paroxysmal supraventricular tachycardia    Liver cirrhosis secondary to NASH ([facility]) 04/23/2018   Irritable bowel syndrome-diarrhea predominant 06/15/2017   Gastroesophageal reflux disease 06/15/2017   Hyperlipidemia    Kidney stone    Essential hypertension 02/13/2010    REFERRING DIAG:   Liver transplant    THERAPY DIAG:  Muscle weakness (generalized)  Rationale for Evaluation and Treatment Rehabilitation  PERTINENT HISTORY: Right TKA, Left Achilles surgery, HTN, OA  PRECAUTIONS:   5# lifting limit currently    SUBJECTIVE: Feel good today and did well after last Rx  PAIN:  Are you having pain? Yes: NPRS scale: 3/10 Pain location: left knee       TODAY'S TREATMENT:                               07-30-22   EXERCISE LOG  Exercise Repetitions and Resistance Comments  Nustep L4-5 x 20 mins  O2 98% and HR 105 BPM after  Knee ext  10# x 4 min   Ham curls  40# x 4 minutes   Inverted BOSU  with UE support as needed  x 5 minutes.   Rockerboard board  DF/PF , balance x 5 minutes.   6in box step ups x20    Blank cell = exercise not performed today                    HOME EXERCISE PROGRAM:     PT Long Term Goals - 06/22/22 1045       PT LONG TERM GOAL #1   Title Patient will be independent with her HEP.    Time 6      PT LONG TERM GOAL #2   Title Walk 500 feet without assisitive device and no rest stop.    Time 6    Period Weeks     Status Achieved     PT LONG TERM GOAL #3   Title Increase bilateral LE strength to 5/5 to provide good stability for accomplishment of functional activities.    Time 6    Period Weeks    Status ongoing     PT LONG TERM GOAL #4   Title Perform normal ADL's independently.    Time 6    Period Weeks    Status Achieved             Plan - 07/30/22 1041  Clinical Impression Statement Pt arrived today doing fairly well and was able to continue with strengthening and balance exs and reports she was able to walk longer now and able to do her presurgery walk, but took her longer.   Personal Factors and Comorbidities Comorbidity 1;Comorbidity 2;Other    Comorbidities Right TKA, Left Achilles surgery, HTN, OA.    Examination-Activity Limitations Other;Locomotion Level    Examination-Participation Restrictions Other;Meal Prep    Stability/Clinical Decision Making Stable/Uncomplicated    Rehab Potential Excellent    PT Frequency 2x / week    PT Duration 6 weeks    PT Treatment/Interventions Therapeutic exercise;Therapeutic activities;Functional mobility training;Gait training;Neuromuscular re-education;Patient/family education;ADLs/Self Care Home Management    PT Next Visit Plan Nustep (please monitor 02 and HR), 5# weight limit.  Generalized conditioning and strengthening.  Gait activities.    Assess walking time toward LTG.   Consulted and Agree with Plan of Care Patient               Alianis Trimmer,CHRIS, PTA 07/30/2022, 10:37 AM

## 2022-08-04 ENCOUNTER — Encounter: Payer: Self-pay | Admitting: *Deleted

## 2022-08-04 ENCOUNTER — Ambulatory Visit: Payer: Medicare Other | Admitting: *Deleted

## 2022-08-04 DIAGNOSIS — M6281 Muscle weakness (generalized): Secondary | ICD-10-CM

## 2022-08-04 NOTE — Therapy (Signed)
OUTPATIENT PHYSICAL THERAPY TREATMENT NOTE   Patient Name: Dawn Moon MRN: [id number] DOB:23-Apr-1956, 66 y.o., female Today's Date: 08/04/2022  REFERRING PROVIDER: Lottie Rater NP.   PT End of Session - 08/04/22 1049     Visit Number 15    Number of Visits 16    Date for PT Re-Evaluation 09/14/22    PT Start Time 1030    PT Stop Time 1120    PT Time Calculation (min) 50 min              Past Medical History:  Diagnosis Date   Ascites    Dysrhythmia    hx of SVT   Encephalopathy, hepatic ([place]) 05/13/2018   GERD (gastroesophageal reflux disease)    History of exercise stress test    03-07-2010  Stress echo--- no arrhythmias or conduction abnormalilites and negative for ischemia or chest pain   History of kidney stones    History of paroxysmal supraventricular tachycardia    episode 2011  consult w/ dr Caryl Comes --  put on atenolol--  per pt no longer an issue   HTN (hypertension)    cardiologist --  dr Stanford Breed   IBS (irritable bowel syndrome)    diarrhea   Nonalcoholic steatohepatitis (NASH)    Numbness and tingling of left lower extremity    post achilles tendon repair   OA (osteoarthritis)    knees   Pleural effusion on right    hepatic hydrothorax   PMB (postmenopausal bleeding) none recent   thickened endometrium   PONV (postoperative nausea and vomiting)    Recurrent UTI none since 2019   Secondary esophageal varices without bleeding ([place]) 06/03/2018   Vitamin D deficiency    Wears glasses    Past Surgical History:  Procedure Laterality Date   ACHILLES TENDON SURGERY Left 06/2016   BIOPSY  11/25/2021   Procedure: BIOPSY;  Surgeon: Gatha Mayer, MD;  [facility];  Service: Endoscopy;;   BREAST BIOPSY Left 02/2016   benign   CARDIAC CATHETERIZATION     COLONOSCOPY  2005   COLONOSCOPY WITH PROPOFOL N/A 11/25/2021   Procedure: COLONOSCOPY WITH PROPOFOL;  Surgeon: Gatha Mayer, MD;  [facility];  Service:  Endoscopy;  Laterality: N/A;   CT CTA CORONARY W/CA SCORE W/CM &/OR WO/CM  01/01/2014   non-obstructive calcified plaque in pLAD (0-25%), no significant incidental noncardiac findings noted   DILATATION & CURETTAGE/HYSTEROSCOPY WITH MYOSURE N/A 12/02/2020   Procedure: DILATATION & CURETTAGE/HYSTEROSCOPY WITH POLYPECTOMY AND REMOVAL OF VAGINAL LESION;  Surgeon: [name], MD;  Location: [place];  Service: Gynecology;  Laterality: N/A;  request to follow in [place] held (DR. Delilah Shan has two other cases that morning starting 7:30am)   ESOPHAGOGASTRODUODENOSCOPY  01/2014   EXTRACORPOREAL SHOCK WAVE LITHOTRIPSY  2010   KNEE ARTHROPLASTY Right 03/24/2018   Procedure: RIGHT TOTAL KNEE ARTHROPLASTY WITH COMPUTER NAVIGATION;  Surgeon: Rod Can, MD;  [facility];  Service: Orthopedics;  Laterality: Right;  Needs RNFA   KNEE ARTHROSCOPY Right 12/04/2016   Procedure: ARTHROSCOPY KNEE WITH PARTIAL MEDIAL MENISCECTOMY;  Surgeon: Rod Can, MD;  Location: [place];  Service: Orthopedics;  Laterality: Right;   LEFT HEART CATH AND CORONARY ANGIOGRAPHY N/A 11/20/2021   Procedure: LEFT HEART CATH AND CORONARY ANGIOGRAPHY;  Surgeon: Burnell Blanks, MD;  Location: [place] [facility];  Service: Cardiovascular;  Laterality: N/A;   LEFT HEART CATHETERIZATION WITH CORONARY ANGIOGRAM N/A 01/11/2012   Procedure: LEFT  HEART CATHETERIZATION WITH CORONARY ANGIOGRAM;  Surgeon: Sherren Mocha, MD;  Location: [facility];  Service: Cardiovascular;  Laterality: N/A;  widely patent coronary arterires without significant obstructive CAD,  normal LVF, ef 55-56%   LIVER TRANSPLANTATION  06/03/2022   TONSILLECTOMY AND ADENOIDECTOMY  child   TRANSTHORACIC ECHOCARDIOGRAM  01/18/2012   mild LVH,  ef 60%/  trivial TR   TUBAL LIGATION  1985   UPPER GASTROINTESTINAL ENDOSCOPY  last done 2019   Patient Active Problem List   Diagnosis Date Noted   Colon  cancer screening    Mucosal abnormality of colon    Coronary artery disease involving native coronary artery of native heart without angina pectoris    Adnexal mass 10/25/2020   Elevated cancer antigen 125 (CA 125) 10/25/2020   Thickened endometrium 10/25/2020   Portal hypertensive gastropathy ([place]) 02/29/2020   Recurrent UTI 06/12/2019   Secondary insomnia 04/10/2019   Anemia 02/28/2019   Thrombocytopenia ([place]) 02/28/2019   Degeneration of lumbar intervertebral disc 08/06/2018   Secondary esophageal varices without bleeding ([place]) 06/03/2018   Encephalopathy, hepatic ([place]) 05/13/2018   History of paroxysmal supraventricular tachycardia    Liver cirrhosis secondary to NASH ([place]) 04/23/2018   Irritable bowel syndrome-diarrhea predominant 06/15/2017   Gastroesophageal reflux disease 06/15/2017   Hyperlipidemia    Kidney stone    Essential hypertension 02/13/2010    REFERRING DIAG:   Liver transplant    THERAPY DIAG:  Muscle weakness (generalized)  Rationale for Evaluation and Treatment Rehabilitation  PERTINENT HISTORY: Right TKA, Left Achilles surgery, HTN, OA  PRECAUTIONS:   5# lifting limit currently    SUBJECTIVE: Feel good today and did well after last Rx. Had a bad H/A yesterday  PAIN:  Are you having pain? Yes: NPRS scale:  /10 Pain location: left knee       TODAY'S TREATMENT:                               08-04-22   EXERCISE LOG  Exercise Repetitions and Resistance Comments  Nustep L5 x 20 mins 1.03 miles O2 98% and HR 111 BPM after  Knee ext  20# x 4 min   Ham curls  40# x 4 minutes   Inverted BOSU  with UE support as needed     Balance beam  X 5 mins heel/toe gait    Heel ups/Toe ups 2 x fatigue each way   Rockerboard board  DF/PF , balance x 5 minutes.   6in box step ups x20    Blank cell = exercise not performed today                    HOME EXERCISE PROGRAM:     PT Long Term Goals - 06/22/22 1045       PT LONG TERM GOAL #1   Title Patient  will be independent with her HEP.    Time 6      PT LONG TERM GOAL #2   Title Walk 500 feet without assisitive device and no rest stop.    Time 6    Period Weeks    Status Achieved     PT LONG TERM GOAL #3   Title Increase bilateral LE strength to 5/5 to provide good stability for accomplishment of functional activities.    Time 6    Period Weeks    Status ongoing     PT LONG TERM GOAL #4  Title Perform normal ADL's independently.    Time 6    Period Weeks    Status Achieved             Plan - 08/04/22 1041     Clinical Impression Statement Pt arrived today doing fairly well and was able to progress with increased resistance today with mainly fatigue end of session. DC after next Rx   Personal Factors and Comorbidities Comorbidity 1;Comorbidity 2;Other    Comorbidities Right TKA, Left Achilles surgery, HTN, OA.    Examination-Activity Limitations Other;Locomotion Level    Examination-Participation Restrictions Other;Meal Prep    Stability/Clinical Decision Making Stable/Uncomplicated    Rehab Potential Excellent    PT Frequency 2x / week    PT Duration 6 weeks    PT Treatment/Interventions Therapeutic exercise;Therapeutic activities;Functional mobility training;Gait training;Neuromuscular re-education;Patient/family education;ADLs/Self Care Home Management    PT Next Visit Plan Nustep (please monitor 02 and HR), 5# weight limit.  Generalized conditioning and strengthening.  Gait activities.    Assess walking time toward LTG.   Consulted and Agree with Plan of Care Patient               Lariza Cothron,CHRIS, PTA 08/04/2022, 11:35 AM

## 2022-08-06 ENCOUNTER — Encounter: Payer: Self-pay | Admitting: *Deleted

## 2022-08-06 ENCOUNTER — Ambulatory Visit: Payer: Medicare Other | Admitting: *Deleted

## 2022-08-06 DIAGNOSIS — M6281 Muscle weakness (generalized): Secondary | ICD-10-CM

## 2022-08-06 NOTE — Therapy (Incomplete)
OUTPATIENT PHYSICAL THERAPY TREATMENT NOTE   Patient Name: Dawn Moon MRN: [id number] DOB:1956-08-25, 66 y.o., female Today's Date: 08/06/2022  REFERRING PROVIDER: Lottie Rater NP.   PT End of Session - 08/06/22 1047     Visit Number 16    Number of Visits 16    Date for PT Re-Evaluation 09/14/22    PT Start Time 1030    PT Stop Time 1118    PT Time Calculation (min) 48 min              Past Medical History:  Diagnosis Date   Ascites    Dysrhythmia    hx of SVT   Encephalopathy, hepatic (HCC) 05/13/2018   GERD (gastroesophageal reflux disease)    History of exercise stress test    03-07-2010  Stress echo--- no arrhythmias or conduction abnormalilites and negative for ischemia or chest pain   History of kidney stones    History of paroxysmal supraventricular tachycardia    episode 2011  consult w/ dr Caryl Comes --  put on atenolol--  per pt no longer an issue   HTN (hypertension)    cardiologist --  dr Stanford Breed   IBS (irritable bowel syndrome)    diarrhea   Nonalcoholic steatohepatitis (NASH)    Numbness and tingling of left lower extremity    post achilles tendon repair   OA (osteoarthritis)    knees   Pleural effusion on right    hepatic hydrothorax   PMB (postmenopausal bleeding) none recent   thickened endometrium   PONV (postoperative nausea and vomiting)    Recurrent UTI none since 2019   Secondary esophageal varices without bleeding ([place]) 06/03/2018   Vitamin D deficiency    Wears glasses    Past Surgical History:  Procedure Laterality Date   ACHILLES TENDON SURGERY Left 06/2016   BIOPSY  11/25/2021   Procedure: BIOPSY;  Surgeon: Gatha Mayer, MD;  [facility];  Service: Endoscopy;;   BREAST BIOPSY Left 02/2016   benign   CARDIAC CATHETERIZATION     COLONOSCOPY  2005   COLONOSCOPY WITH PROPOFOL N/A 11/25/2021   Procedure: COLONOSCOPY WITH PROPOFOL;  Surgeon: Gatha Mayer, MD;  [facility];  Service:  Endoscopy;  Laterality: N/A;   CT CTA CORONARY W/CA SCORE W/CM &/OR WO/CM  01/01/2014   non-obstructive calcified plaque in pLAD (0-25%), no significant incidental noncardiac findings noted   DILATATION & CURETTAGE/HYSTEROSCOPY WITH MYOSURE N/A 12/02/2020   Procedure: DILATATION & CURETTAGE/HYSTEROSCOPY WITH POLYPECTOMY AND REMOVAL OF VAGINAL LESION;  Surgeon: [name], MD;  Location: [place];  Service: Gynecology;  Laterality: N/A;  request to follow in [place] held (DR. Delilah Shan has two other cases that morning starting 7:30am)   ESOPHAGOGASTRODUODENOSCOPY  01/2014   EXTRACORPOREAL SHOCK WAVE LITHOTRIPSY  2010   KNEE ARTHROPLASTY Right 03/24/2018   Procedure: RIGHT TOTAL KNEE ARTHROPLASTY WITH COMPUTER NAVIGATION;  Surgeon: Rod Can, MD;  [facility];  Service: Orthopedics;  Laterality: Right;  Needs RNFA   KNEE ARTHROSCOPY Right 12/04/2016   Procedure: ARTHROSCOPY KNEE WITH PARTIAL MEDIAL MENISCECTOMY;  Surgeon: Rod Can, MD;  Location: [place];  Service: Orthopedics;  Laterality: Right;   LEFT HEART CATH AND CORONARY ANGIOGRAPHY N/A 11/20/2021   Procedure: LEFT HEART CATH AND CORONARY ANGIOGRAPHY;  Surgeon: Burnell Blanks, MD;  Location: [place] [facility];  Service: Cardiovascular;  Laterality: N/A;   LEFT HEART CATHETERIZATION WITH CORONARY ANGIOGRAM N/A 01/11/2012   Procedure: LEFT  HEART CATHETERIZATION WITH CORONARY ANGIOGRAM;  Surgeon: Sherren Mocha, MD;  Location[facility] CATH LAB;  Service: Cardiovascular;  Laterality: N/A;  widely patent coronary arterires without significant obstructive CAD,  normal LVF, ef 55-56%   LIVER TRANSPLANTATION  06/03/2022   TONSILLECTOMY AND ADENOIDECTOMY  child   TRANSTHORACIC ECHOCARDIOGRAM  01/18/2012   mild LVH,  ef 60%/  trivial TR   TUBAL LIGATION  1985   UPPER GASTROINTESTINAL ENDOSCOPY  last done 2019   Patient Active Problem List   Diagnosis Date Noted   Colon  cancer screening    Mucosal abnormality of colon    Coronary artery disease involving native coronary artery of native heart without angina pectoris    Adnexal mass 10/25/2020   Elevated cancer antigen 125 (CA 125) 10/25/2020   Thickened endometrium 10/25/2020   Portal hypertensive gastropathy ([place]) 02/29/2020   Recurrent UTI 06/12/2019   Secondary insomnia 04/10/2019   Anemia 02/28/2019   Thrombocytopenia ([place]) 02/28/2019   Degeneration of lumbar intervertebral disc 08/06/2018   Secondary esophageal varices without bleeding ([place]) 06/03/2018   Encephalopathy, hepatic ([place]) 05/13/2018   History of paroxysmal supraventricular tachycardia    Liver cirrhosis secondary to NASH ([place]) 04/23/2018   Irritable bowel syndrome-diarrhea predominant 06/15/2017   Gastroesophageal reflux disease 06/15/2017   Hyperlipidemia    Kidney stone    Essential hypertension 02/13/2010    REFERRING DIAG:   Liver transplant    THERAPY DIAG:  Muscle weakness (generalized)  Rationale for Evaluation and Treatment Rehabilitation  PERTINENT HISTORY: Right TKA, Left Achilles surgery, HTN, OA  PRECAUTIONS:   5# lifting limit currently    SUBJECTIVE: Feel good today and ready to DC.  PAIN:  Are you having pain? Yes: NPRS scale:  /10 Pain location: left knee       TODAY'S TREATMENT:                               08-06-22   EXERCISE LOG  Exercise Repetitions and Resistance Comments  Nustep L5 x 20 mins 1.03 miles O2 97% and HR 112 BPM after  Knee ext  20# x 4 min   Ham curls  40# x 4 minutes    Hip ABD Bil. 3x15   Balance beam  X 5 mins heel/toe gait    Heel ups/Toe ups 2 x fatigue each way   Rockerboard board  DF/PF , balance x 5 minutes.   Up/down steps outside  5 steps x 5 with Hand rail for balance    Blank cell = exercise not performed today                    HOME EXERCISE PROGRAM:     PT Long Term Goals - 08/06/22       PT LONG TERM GOAL #1   Title Patient will be independent  with her HEP.    Time 6       Achieved            PT LONG TERM GOAL #2   Title Walk 500 feet without assisitive device and no rest stop.    Time 6    Period Weeks    Status Achieved     PT LONG TERM GOAL #3   Title Increase bilateral LE strength to 5/5 to provide good stability for accomplishment of functional activities.    Time 6    Period Weeks    Status Achieved  PT LONG TERM GOAL #4   Title Perform normal ADL's independently.    Time 6    Period Weeks    Status Achieved             Plan - 08/06/22 1041     Clinical Impression Statement Pt arrived today feeling well and was able to continue with therex and balance. Pt was able to meet all LTG's and will be DC to gym program/HEP   Personal Factors and Comorbidities Comorbidity 1;Comorbidity 2;Other    Comorbidities Right TKA, Left Achilles surgery, HTN, OA.    Examination-Activity Limitations Other;Locomotion Level    Examination-Participation Restrictions Other;Meal Prep    Stability/Clinical Decision Making Stable/Uncomplicated    Rehab Potential Excellent    PT Frequency 2x / week    PT Duration 6 weeks    PT Treatment/Interventions Therapeutic exercise;Therapeutic activities;Functional mobility training;Gait training;Neuromuscular re-education;Patient/family education;ADLs/Self Care Home Management    PT Next Visit Plan DC to HEP   Consulted and Agree with Plan of Care Patient               Taneka Espiritu,CHRIS, PTA 08/06/2022, 1:11 PM    PHYSICAL THERAPY DISCHARGE SUMMARY  Visits from Start of Care: 16  Current functional level related to goals / functional outcomes: See LTGs   Remaining deficits: See LTGs   Education / Equipment: HEP   Patient agrees to discharge. Patient goals were {OP Goals:25702::"met"}. Patient is being discharged due to {OP Discharge Reasons:25703::"meeting the stated rehab goals."}

## 2022-08-12 ENCOUNTER — Ambulatory Visit (INDEPENDENT_AMBULATORY_CARE_PROVIDER_SITE_OTHER): Payer: Medicare Other | Admitting: Internal Medicine

## 2022-08-12 ENCOUNTER — Encounter: Payer: Self-pay | Admitting: Internal Medicine

## 2022-08-12 VITALS — BP 120/70 | HR 87 | Ht 63.0 in | Wt 156.2 lb

## 2022-08-12 DIAGNOSIS — K580 Irritable bowel syndrome with diarrhea: Secondary | ICD-10-CM | POA: Diagnosis not present

## 2022-08-12 DIAGNOSIS — Z944 Liver transplant status: Secondary | ICD-10-CM | POA: Diagnosis not present

## 2022-08-12 DIAGNOSIS — K7581 Nonalcoholic steatohepatitis (NASH): Secondary | ICD-10-CM | POA: Diagnosis not present

## 2022-08-12 DIAGNOSIS — I208 Other forms of angina pectoris: Secondary | ICD-10-CM

## 2022-08-12 DIAGNOSIS — K7460 Unspecified cirrhosis of liver: Secondary | ICD-10-CM | POA: Diagnosis not present

## 2022-08-12 NOTE — Patient Instructions (Signed)
Great to see you today.  Try Premium Protein drinks, they come in many flavors.  Try over the counter Imodium : Take 1-2 tablets daily. You may want to take it at night to help with the diarrhea.   I appreciate the opportunity to care for you. Silvano Rusk, MD, [facility]

## 2022-08-12 NOTE — Progress Notes (Signed)
Dawn Moon 66 y.o. Sep 11, 1956 [id number]  Assessment & Plan:   Encounter Diagnoses  Name Primary?   Irritable bowel syndrome with diarrhea Yes   Liver transplant status ([place])    Liver cirrhosis secondary to NASH ([place])    For her IBS I have suggested she try 8 loperamide on a daily basis 1 or 2 a day.  I wonder how much of this is dietary and related to the insurer itself.  She will search for other protein drinks to see if they cause less diarrhea.  Once she transitions off of that I would hope that her diarrhea would be even better.  She may follow-up as needed for that.  I did fill out the referral for her exercise program.  I think she is a good candidate for this.  There is a 5 pound lifting restriction at this point that we will involve and be changed by her transplant surgeon.   CC: Dawn Booze, NP  Subjective:   Chief Complaint: IBS, follow-up after liver transplant  HPI 67 year old woman with a history of Dawn Moon cirrhosis status post orthotopic liver transplant at [facility] in June presenting with complaints of some diarrhea in the setting of a long history of IBS.  She was using Lomotil once or twice a week.  She is currently on Ensure high-protein 1 or 2 a day and when she takes it in the morning she will often have relatively urgent loose bowel movements.  A couple of times a week she will have other episodes for which she will use the Lomotil.  She used to be on Viberzi but we stopped that when her liver function deteriorated.  Symptoms are not as bad as they were preliver transplant.  Overall she is much better with energy levels higher and doing well after her transplant.  She did have some peripheral edema that has resolved.  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  She just finished physical therapy.  There is a history of coronary artery disease but she is not having any angina or dyspnea on  exertion type problems or anything to suggest angina.  She follows up with Dr. Stanford Moon in October. Allergies  Allergen Reactions   Simvastatin Other (See Comments)    Muscle aches and cramps.   Current Meds  Medication Sig   acetaminophen (TYLENOL) 325 MG tablet Take by mouth as needed.   aspirin EC 81 MG tablet Take by mouth.   Calcium Carbonate-Vit D-Min (CALTRATE 600+D PLUS MINERALS) 600-800 MG-UNIT TABS    diphenoxylate-atropine (LOMOTIL) 2.5-0.025 MG tablet TAKE  (1) OR (2)  TABLETS EVERY SIX HOURS AS NEEDED   Lidocaine 4 % PTCH Apply 1 application topically daily as needed (pain).   omeprazole (PRILOSEC) 20 MG capsule Take 20 mg by mouth daily.   predniSONE (DELTASONE) 5 MG tablet Take by mouth.   promethazine (PHENERGAN) 25 MG tablet Take 1 tablet (25 mg total) by mouth every 6 (six) hours as needed for nausea or vomiting. (Patient taking differently: Take 12.5 mg by mouth daily as needed for nausea or vomiting.)   tacrolimus (PROGRAF) 1 MG capsule Take by mouth.   Current Facility-Administered Medications for the 08/12/22 encounter (Office Visit) with Dawn Mayer, MD  Medication   sodium chloride flush (NS) 0.9 % injection 3 mL   Past Medical History:  Diagnosis Date   Ascites    Dysrhythmia  hx of SVT   Encephalopathy, hepatic (HCC) 05/13/2018   GERD (gastroesophageal reflux disease)    History of exercise stress test    03-07-2010  Stress echo--- no arrhythmias or conduction abnormalilites and negative for ischemia or chest pain   History of kidney stones    History of paroxysmal supraventricular tachycardia    episode 2011  consult w/ dr Dawn Moon --  put on atenolol--  per pt no longer an issue   HTN (hypertension)    cardiologist --  dr Dawn Moon   IBS (irritable bowel syndrome)    diarrhea   Nonalcoholic steatohepatitis (NASH)    Numbness and tingling of left lower extremity    post achilles tendon repair   OA (osteoarthritis)    knees   Osteoporosis     Pleural effusion on right    hepatic hydrothorax   PMB (postmenopausal bleeding) none recent   thickened endometrium   PONV (postoperative nausea and vomiting)    Recurrent UTI none since 2019   Secondary esophageal varices without bleeding ([place]) 06/03/2018   Vitamin D deficiency    Wears glasses    Past Surgical History:  Procedure Laterality Date   ACHILLES TENDON SURGERY Left 06/2016   BIOPSY  11/25/2021   Procedure: BIOPSY;  Surgeon: Dawn Mayer, MD;  [facility];  Service: Endoscopy;;   BREAST BIOPSY Left 02/2016   benign   CARDIAC CATHETERIZATION     COLONOSCOPY  2005   COLONOSCOPY WITH PROPOFOL N/A 11/25/2021   Procedure: COLONOSCOPY WITH PROPOFOL;  Surgeon: Dawn Mayer, MD;  [facility];  Service: Endoscopy;  Laterality: N/A;   CT CTA CORONARY W/CA SCORE W/CM &/OR WO/CM  01/01/2014   non-obstructive calcified plaque in pLAD (0-25%), no significant incidental noncardiac findings noted   DILATATION & CURETTAGE/HYSTEROSCOPY WITH MYOSURE N/A 12/02/2020   Procedure: DILATATION & CURETTAGE/HYSTEROSCOPY WITH POLYPECTOMY AND REMOVAL OF VAGINAL LESION;  Surgeon: Dawn Pierini, MD;  Location: [place];  Service: Gynecology;  Laterality: N/A;  request to follow in [place] held (DR. Delilah Moon has two other cases that morning starting 7:30am)   ESOPHAGOGASTRODUODENOSCOPY  01/2014   EXTRACORPOREAL SHOCK WAVE LITHOTRIPSY  2010   KNEE ARTHROPLASTY Right 03/24/2018   Procedure: RIGHT TOTAL KNEE ARTHROPLASTY WITH COMPUTER NAVIGATION;  Surgeon: Dawn Can, MD;  [facility];  Service: Orthopedics;  Laterality: Right;  Needs RNFA   KNEE ARTHROSCOPY Right 12/04/2016   Procedure: ARTHROSCOPY KNEE WITH PARTIAL MEDIAL MENISCECTOMY;  Surgeon: Dawn Can, MD;  Location: [place];  Service: Orthopedics;  Laterality: Right;   LEFT HEART CATH AND CORONARY ANGIOGRAPHY N/A 11/20/2021   Procedure: LEFT HEART CATH  AND CORONARY ANGIOGRAPHY;  Surgeon: Dawn Blanks, MD;  Location: [place] [facility];  Service: Cardiovascular;  Laterality: N/A;   LEFT HEART CATHETERIZATION WITH CORONARY ANGIOGRAM N/A 01/11/2012   Procedure: LEFT HEART CATHETERIZATION WITH CORONARY ANGIOGRAM;  Surgeon: Sherren Mocha, MD;  Location: [facility] CATH LAB;  Service: Cardiovascular;  Laterality: N/A;  widely patent coronary arterires without significant obstructive CAD,  normal LVF, ef 55-56%   LIVER TRANSPLANTATION  06/03/2022   TONSILLECTOMY AND ADENOIDECTOMY  child   TRANSTHORACIC ECHOCARDIOGRAM  01/18/2012   mild LVH,  ef 60%/  trivial TR   TUBAL LIGATION  1985   UPPER GASTROINTESTINAL ENDOSCOPY  last done 2019   Social History   Social History Narrative   Married, 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  Has to walk up stairs to work and can do w/o Ss.  Lives @ home in [place] with family.   family history includes Arthritis in her sister; Breast cancer in her maternal aunt; Celiac disease in her daughter; Coronary artery disease in her father and mother; Diabetes in her father; Healthy in her brother; Hypertension in her sister; IgA nephropathy in her daughter; Kidney failure in her daughter; Lymphoma in her mother.   Review of Systems See HPI  Objective:   Physical Exam BP 120/70   Pulse 87   Ht 5' 3"  (1.6 m)   Wt 156 lb 4 oz (70.9 kg)   BMI 27.68 kg/m  NAD Lungs cta Cor NL S1S2 no rmg Abd chevron scar soft, sl tender RUQ Ext no edema

## 2022-08-13 ENCOUNTER — Telehealth: Payer: Self-pay

## 2022-08-13 NOTE — Telephone Encounter (Signed)
Called to discuss PREP program referral; offered class at [facility] in October or [facility] in Nov; wants to try to get into a program sooner; she will explore options and contact me if she wants to attend either location or even wait to attend [facility] in December.

## 2022-09-01 NOTE — Progress Notes (Unsigned)
GYNECOLOGY  VISIT   HPI: 66 y.o.   Married  Caucasian  female   [phone] with No LMP recorded. Patient is postmenopausal.   here for follow up on right ovarian cyst. Patient is followed conservatively for a right ovarian cyst and elevated CA125 attributed to liver cirrhosis and ascites.    The ovarian cyst was first noted with a CT scans and has been followed with pelvic ultrasounds.  Her original pelvic ultrasound done 10/03/20 showed the right ovarian cyst measured 6.6 x 4.5 x 5.2 cm.    Her pelvic US 02/20/21 showed the right ovarian cyst 6.4 x 4.0 cm with a 6 x 4 mm solid avascular component to the wall.  Her endometrium measured 5.8 mm.    CA125 measured 211 on 03/04/21.  This is decreased from 10/14/20 when it measured 353.   Dr. Everitt Amber of [facility] has consulted for the patient on 10/25/20 and recommended yearly ultrasounds and conservative management unless the cyst increases in size by 50% or develops solid components.   She recommended against surgical care for the recent development of a solid avascular nodule of the cyst wall noted on the pelvic US 02/20/21.  Last pelvic US done 09/04/21: Uterus 8.97 x 5.62 x 4.71 cm.  Fibroid 0.99 cm.  EMS 3.42 mm.  Right ovary small with adjacent cyst noted 6.8 x 4.5 cm, slightly larger since last [place] 02/20/21. No nodules noted.  Left ovary atrophic.  No adnexal masses.  7.5 cm pocket of free fluid noted.   Had liver transplant 06-03-22. Some right sided pain following her transplant.  Denies vaginal bleeding.  Some early morning hot flashes.   PCP has been doing AEX and paps.  GYNECOLOGIC HISTORY: No LMP recorded. Patient is postmenopausal. Contraception:  PMP Menopausal hormone therapy:  none Last mammogram:  05-07-22 Diag.Bil--SEE Epic.  She is due for follow up in December. Last pap smear: 03-03-22 normal per patient with PCP        OB History     Gravida  3   Para  3   Term  3   Preterm      AB      Living  3      SAB       IAB      Ectopic      Multiple      Live Births                 Patient Active Problem List   Diagnosis Date Noted   Liver transplant status ([place]) 08/12/2022   Coronary artery disease involving native coronary artery of native heart without angina pectoris    Adnexal mass 10/25/2020   Elevated cancer antigen 125 (CA 125) 10/25/2020   Thickened endometrium 10/25/2020   Recurrent UTI 06/12/2019   Secondary insomnia 04/10/2019   Anemia 02/28/2019   Degeneration of lumbar intervertebral disc 08/06/2018   History of paroxysmal supraventricular tachycardia    Liver cirrhosis secondary to NASH ([place]) 04/23/2018   Irritable bowel syndrome-diarrhea predominant 06/15/2017   Gastroesophageal reflux disease 06/15/2017   Hyperlipidemia    Kidney stone    Essential hypertension 02/13/2010    Past Medical History:  Diagnosis Date   Ascites    Dysrhythmia    hx of SVT   Encephalopathy, hepatic ([place]) 05/13/2018   GERD (gastroesophageal reflux disease)    History of exercise stress test    03-07-2010  Stress echo--- no arrhythmias or conduction abnormalilites and negative for  ischemia or chest pain   History of kidney stones    History of paroxysmal supraventricular tachycardia    episode 2011  consult w/ dr Caryl Comes --  put on atenolol--  per pt no longer an issue   HTN (hypertension)    cardiologist --  dr Stanford Breed   IBS (irritable bowel syndrome)    diarrhea   Nonalcoholic steatohepatitis ([facility])    Numbness and tingling of left lower extremity    post achilles tendon repair   OA (osteoarthritis)    knees   Osteoporosis    Pleural effusion on right    hepatic hydrothorax   PMB (postmenopausal bleeding) none recent   thickened endometrium   PONV (postoperative nausea and vomiting)    Recurrent UTI none since 2019   Secondary esophageal varices without bleeding ([place]) 06/03/2018   Vitamin D deficiency    Wears glasses     Past Surgical History:  Procedure Laterality  Date   ACHILLES TENDON SURGERY Left 06/2016   BIOPSY  11/25/2021   Procedure: BIOPSY;  Surgeon: Gatha Mayer, MD;  Location: [facility];  Service: Endoscopy;;   BREAST BIOPSY Left 02/2016   benign   CARDIAC CATHETERIZATION     COLONOSCOPY  2005   COLONOSCOPY WITH PROPOFOL N/A 11/25/2021   Procedure: COLONOSCOPY WITH PROPOFOL;  Surgeon: Gatha Mayer, MD;  Location: [facility];  Service: Endoscopy;  Laterality: N/A;   CT CTA CORONARY W/CA SCORE W/CM &/OR WO/CM  01/01/2014   non-obstructive calcified plaque in pLAD (0-25%), no significant incidental noncardiac findings noted   DILATATION & CURETTAGE/HYSTEROSCOPY WITH MYOSURE N/A 12/02/2020   Procedure: DILATATION & CURETTAGE/HYSTEROSCOPY WITH POLYPECTOMY AND REMOVAL OF VAGINAL LESION;  Surgeon: [name], MD;  Location: [facility];  Service: Gynecology;  Laterality: N/A;  request to follow in [place] held (DR. Delilah Shan has two other cases that morning starting 7:30am)   ESOPHAGOGASTRODUODENOSCOPY  01/2014   EXTRACORPOREAL SHOCK WAVE LITHOTRIPSY  2010   KNEE ARTHROPLASTY Right 03/24/2018   Procedure: RIGHT TOTAL KNEE ARTHROPLASTY WITH COMPUTER NAVIGATION;  Surgeon: Rod Can, MD;  Location: [facility];  Service: Orthopedics;  Laterality: Right;  Needs RNFA   KNEE ARTHROSCOPY Right 12/04/2016   Procedure: ARTHROSCOPY KNEE WITH PARTIAL MEDIAL MENISCECTOMY;  Surgeon: Rod Can, MD;  Location: [facility];  Service: Orthopedics;  Laterality: Right;   LEFT HEART CATH AND CORONARY ANGIOGRAPHY N/A 11/20/2021   Procedure: LEFT HEART CATH AND CORONARY ANGIOGRAPHY;  Surgeon: Burnell Blanks, MD;  Location: [facility];  Service: Cardiovascular;  Laterality: N/A;   LEFT HEART CATHETERIZATION WITH CORONARY ANGIOGRAM N/A 01/11/2012   Procedure: LEFT HEART CATHETERIZATION WITH CORONARY ANGIOGRAM;  Surgeon: Sherren Mocha, MD;  Location: [facility];  Service: Cardiovascular;   Laterality: N/A;  widely patent coronary arterires without significant obstructive CAD,  normal LVF, ef 55-56%   LIVER TRANSPLANTATION  06/03/2022   TONSILLECTOMY AND ADENOIDECTOMY  child   TRANSTHORACIC ECHOCARDIOGRAM  01/18/2012   mild LVH,  ef 60%/  trivial TR   TUBAL LIGATION  1985   UPPER GASTROINTESTINAL ENDOSCOPY  last done 2019    Current Outpatient Medications  Medication Sig Dispense Refill   acetaminophen (TYLENOL) 325 MG tablet Take by mouth as needed.     aspirin EC 81 MG tablet Take by mouth.     Calcium Carbonate-Vit D-Min (CALTRATE 600+D PLUS MINERALS) 600-800 MG-UNIT TABS      clotrimazole (MYCELEX) 10 MG troche SMARTSIG:1 Lozenge(s) By Mouth Twice  Daily     diphenoxylate-atropine (LOMOTIL) 2.5-0.025 MG tablet Take by mouth.     ergocalciferol (VITAMIN D2) 1.25 MG (50000 UT) capsule Take by mouth.     GNP MELATONIN 3 MG TABS tablet Take 3 mg by mouth daily. 2 at bedtime     Lidocaine 4 % PTCH Apply 1 application topically daily as needed (pain).     mycophenolate (CELLCEPT) 250 MG capsule SMARTSIG:4 Capsule(s) By Mouth Every 12 Hours     omeprazole (PRILOSEC) 20 MG capsule Take 20 mg by mouth daily.     predniSONE (DELTASONE) 5 MG tablet Take by mouth.     promethazine (PHENERGAN) 12.5 MG tablet 12.5 mg by oral route.     Specialty Vitamins Products (MG PLUS PROTEIN) 133 MG TABS Take by mouth.     tacrolimus (PROGRAF) 1 MG capsule Take by mouth.     Current Facility-Administered Medications  Medication Dose Route Frequency Provider Last Rate Last Admin   sodium chloride flush (NS) 0.9 % injection 3 mL  3 mL Intravenous Q12H Crenshaw, Denice Bors, MD         ALLERGIES: Rosuvastatin and Simvastatin  Family History  Problem Relation Age of Onset   Lymphoma Mother        non-hodgkins - died @ 41   Coronary artery disease Mother    Coronary artery disease Father        CABG in his 9s, died @ 55   Diabetes Father    Healthy Brother    Hypertension Sister     Arthritis Sister    IgA nephropathy Daughter    Kidney failure Daughter    Celiac disease Daughter    Breast cancer Maternal Aunt    Colon cancer Neg Hx    Esophageal cancer Neg Hx    Stomach cancer Neg Hx    Rectal cancer Neg Hx    Ovarian cancer Neg Hx    Cervical cancer Neg Hx     Social History   Socioeconomic History   Marital status: Married    Spouse name: Not on file   Number of children: 3   Years of education: 16   Highest education level: Bachelor's degree (e.g., BA, AB, BS)  Occupational History   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  Tobacco Use   Smoking status: Never   Smokeless tobacco: Never  Vaping Use   Vaping Use: Never used  Substance and Sexual Activity   Alcohol use: Not Currently   Drug use: No   Sexual activity: Yes    Birth control/protection: Post-menopausal    Comment: 1st intercourse 66 yo-Fewer than 5 partners  Other Topics Concern   Not on file  Social History Narrative   Married, was RN case manager [place]   Has daughters - one is former [place] NP (Administrator, arts) now working in Acupuncturist   rare EtOH, never smoker, no drugs   Social Determinants of Radio broadcast assistant Strain: Not on file  Food Insecurity: Not on file  Transportation Needs: Not on file  Physical Activity: Not on file  Stress: Not on file  Social Connections: Not on file  Intimate Partner Violence: Not on file    Review of Systems  All other systems reviewed and are negative.   PHYSICAL EXAMINATION:    BP (!) 142/74   Pulse 79   Ht 5' 3"  (1.6 m)   Wt 167 lb (75.8 kg)  SpO2 98%   BMI 29.58 kg/m     General appearance: alert, cooperative and appears stated age Head: Normocephalic, without obvious abnormality, atraumatic Neck: no adenopathy, supple, symmetrical, trachea midline and thyroid normal to inspection and palpation Lungs: clear to auscultation bilaterally Heart: regular rate and  rhythm Abdomen: scar across the upper abdomen with some scar tissue. Abdomen is soft, non-tender, no masses,  no organomegaly Extremities: extremities normal, atraumatic, no cyanosis or edema Skin: Skin color, texture, turgor normal. No rashes or lesions Lymph nodes: Cervical, supraclavicular, and axillary nodes normal. No abnormal inguinal nodes palpated Neurologic: Grossly normal  Pelvic: External genitalia:  no lesions              Urethra:  normal appearing urethra with no masses, tenderness or lesions              Bartholins and Skenes: normal                 Vagina: normal appearing vagina with normal color and discharge, no lesions              Cervix: no lesions                Bimanual Exam:  Uterus:  normal size, contour, position, consistency, mobility, non-tender              Adnexa: no mass, fullness, tenderness on left.  Fullness on right, nontender.               Rectal exam: yes.  Confirms.              Anus:  normal sphincter tone, no lesions  Chaperone was present for exam:  Estill Bamberg, CMA  ASSESSMENT  Right ovarian cyst.  Elevated CA125.  Attributed to cirrhosis and ascites. Status post liver transplant.   PLAN  Prior US report reviewed.  Return for pelvic US and recheck of CA125.    An After Visit Summary was printed and given to the patient.  30 min  total time was spent for this patient encounter, including preparation, face-to-face counseling with the patient, coordination of care, and documentation of the encounter.

## 2022-09-02 ENCOUNTER — Ambulatory Visit (INDEPENDENT_AMBULATORY_CARE_PROVIDER_SITE_OTHER): Payer: Medicare Other | Admitting: Obstetrics and Gynecology

## 2022-09-02 ENCOUNTER — Encounter: Payer: Self-pay | Admitting: Obstetrics and Gynecology

## 2022-09-02 VITALS — BP 142/74 | HR 79 | Ht 63.0 in | Wt 167.0 lb

## 2022-09-02 DIAGNOSIS — I208 Other forms of angina pectoris: Secondary | ICD-10-CM | POA: Diagnosis not present

## 2022-09-02 DIAGNOSIS — N83201 Unspecified ovarian cyst, right side: Secondary | ICD-10-CM

## 2022-09-29 ENCOUNTER — Emergency Department (HOSPITAL_BASED_OUTPATIENT_CLINIC_OR_DEPARTMENT_OTHER)
Admission: EM | Admit: 2022-09-29 | Discharge: 2022-09-29 | Disposition: A | Discharge status: Home / Self Care | Payer: Medicare Other | Attending: Emergency Medicine | Admitting: Emergency Medicine

## 2022-09-29 ENCOUNTER — Other Ambulatory Visit: Payer: Self-pay

## 2022-09-29 ENCOUNTER — Emergency Department (HOSPITAL_BASED_OUTPATIENT_CLINIC_OR_DEPARTMENT_OTHER): Payer: Medicare Other

## 2022-09-29 ENCOUNTER — Encounter (HOSPITAL_BASED_OUTPATIENT_CLINIC_OR_DEPARTMENT_OTHER): Payer: Self-pay | Admitting: Emergency Medicine

## 2022-09-29 DIAGNOSIS — M79604 Pain in right leg: Secondary | ICD-10-CM | POA: Diagnosis present

## 2022-09-29 DIAGNOSIS — Z7982 Long term (current) use of aspirin: Secondary | ICD-10-CM | POA: Insufficient documentation

## 2022-09-29 MED ORDER — DICLOFENAC SODIUM 1 % EX GEL: 4.0000 g | Freq: Four times a day (QID) | CUTANEOUS | 0 refills | Status: DC

## 2022-09-29 MED ORDER — ACETAMINOPHEN 500 MG PO TABS
1000.0000 mg | ORAL_TABLET | Freq: Once | ORAL | Status: AC
  Administered 2022-09-29: 1000 mg via ORAL
  Filled 2022-09-29: qty 2

## 2022-09-29 MED ORDER — OXYCODONE HCL 5 MG PO TABS
5.0000 mg | ORAL_TABLET | Freq: Once | ORAL | Status: AC
  Administered 2022-09-29: 5 mg via ORAL
  Filled 2022-09-29: qty 1

## 2022-09-29 NOTE — Discharge Instructions (Signed)
Please follow with your doctor in the office.  Try to elevate your leg when you are not using it at home.  Please return for worsening pain numbness.  Use the gel as prescribed Also take tylenol 1028m(2 extra strength) four times a day.

## 2022-09-29 NOTE — ED Provider Notes (Signed)
[facility] EMERGENCY DEPARTMENT Provider Note   CSN: [id number] Arrival date & time: 09/29/22  1108     History  Chief Complaint  Patient presents with   Leg Pain    Dawn Moon is a 66 y.o. female.  66 yo F with a chief complaint of right leg pain and swelling.  The patient was seen in urgent care yesterday after having a locker fall onto her leg.  She had an x-ray that was negative for acute fracture and then she was told she needed to go to the emergency department to be evaluated for DVT.  She has had some ongoing pain that she thinks is worsened.  She describes some numbness to the lateral aspect of the foot.   Leg Pain      Home Medications Prior to Admission medications   Medication Sig Start Date End Date Taking? Authorizing Provider  diclofenac Sodium (VOLTAREN) 1 % GEL Apply 4 g topically 4 (four) times daily. 09/29/22  Yes Deno Etienne, DO  acetaminophen (TYLENOL) 325 MG tablet Take by mouth as needed. 06/03/22   [provider]  aspirin EC 81 MG tablet Take by mouth. 06/03/22   [provider]  Calcium Carbonate-Vit D-Min (CALTRATE 600+D PLUS MINERALS) 600-800 MG-UNIT TABS  07/03/22   [provider]  clotrimazole (MYCELEX) 10 MG troche SMARTSIG:1 Lozenge(s) By Mouth Twice Daily 08/05/22   [provider]  diphenoxylate-atropine (LOMOTIL) 2.5-0.025 MG tablet Take by mouth. 06/10/22   [provider]  ergocalciferol (VITAMIN D2) 1.25 MG (50000 UT) capsule Take by mouth. 08/20/22   [provider]  GNP MELATONIN 3 MG TABS tablet Take 3 mg by mouth daily. 2 at bedtime 06/15/22   [provider]  Lidocaine 4 % PTCH Apply 1 application topically daily as needed (pain).    [provider]  mycophenolate (CELLCEPT) 250 MG capsule SMARTSIG:4 Capsule(s) By Mouth Every 12 Hours 08/05/22   [provider]  omeprazole (PRILOSEC) 20 MG capsule Take 20 mg by mouth daily. 09/20/21    [provider]  predniSONE (DELTASONE) 5 MG tablet Take by mouth. 07/28/22   [provider]  promethazine (PHENERGAN) 12.5 MG tablet 12.5 mg by oral route.    [provider]  Specialty Vitamins Products (MG PLUS PROTEIN) 133 MG TABS Take by mouth. 07/06/22   [provider]  tacrolimus (PROGRAF) 1 MG capsule Take by mouth. 06/15/22 08/10/23  [provider]      Allergies    Rosuvastatin and Simvastatin    Review of Systems   Review of Systems  Physical Exam Updated Vital Signs BP (!) 176/88   Pulse (!) 104   Temp 98 F (36.7 C) (Oral)   Resp 17   Ht 5' 3"  (1.6 m)   Wt 71.2 kg   SpO2 98%   BMI 27.81 kg/m  Physical Exam Vitals and nursing note reviewed.  Constitutional:      General: She is not in acute distress.    Appearance: She is well-developed. She is not diaphoretic.  HENT:     Head: Normocephalic and atraumatic.  Eyes:     Pupils: Pupils are equal, round, and reactive to light.  Cardiovascular:     Rate and Rhythm: Normal rate and regular rhythm.     Heart sounds: No murmur heard.    No friction rub. No gallop.  Pulmonary:     Effort: Pulmonary effort is normal.     Breath sounds: No wheezing  or rales.  Abdominal:     General: There is no distension.     Palpations: Abdomen is soft.     Tenderness: There is no abdominal tenderness.  Musculoskeletal:        General: Tenderness present.     Cervical back: Normal range of motion and neck supple.     Comments: Pain and bruising to the R lower leg about the mid shin.  PMS intact distally.  Compartments soft. No obvious pain in the knee  Skin:    General: Skin is warm and dry.  Neurological:     Mental Status: She is alert and oriented to person, place, and time.  Psychiatric:        Behavior: Behavior normal.     ED Results / Procedures / Treatments   Labs (all labs ordered are listed, but only abnormal results are displayed) Labs Reviewed - No data to  display  EKG None  Radiology US Venous Img Lower Unilateral Right  Result Date: 09/29/2022 CLINICAL DATA:  Recent fall, shin bruising and swelling EXAM: RIGHT LOWER EXTREMITY VENOUS DOPPLER ULTRASOUND TECHNIQUE: Gray-scale sonography with graded compression, as well as color Doppler and duplex ultrasound were performed to evaluate the lower extremity deep venous systems from the level of the common femoral vein and including the common femoral, femoral, profunda femoral, popliteal and calf veins including the posterior tibial, peroneal and gastrocnemius veins when visible. The superficial great saphenous vein was also interrogated. Spectral Doppler was utilized to evaluate flow at rest and with distal augmentation maneuvers in the common femoral, femoral and popliteal veins. COMPARISON:  None Available. FINDINGS: Contralateral Common Femoral Vein: Respiratory phasicity is normal and symmetric with the symptomatic side. No evidence of thrombus. Normal compressibility. Common Femoral Vein: No evidence of thrombus. Normal compressibility, respiratory phasicity and response to augmentation. Saphenofemoral Junction: No evidence of thrombus. Normal compressibility and flow on color Doppler imaging. Profunda Femoral Vein: No evidence of thrombus. Normal compressibility and flow on color Doppler imaging. Femoral Vein: No evidence of thrombus. Normal compressibility, respiratory phasicity and response to augmentation. Popliteal Vein: No evidence of thrombus. Normal compressibility, respiratory phasicity and response to augmentation. Calf Veins: No evidence of thrombus. Normal compressibility and flow on color Doppler imaging. Superficial Great Saphenous Vein: No evidence of thrombus. Normal compressibility. Other Findings: Right anterior shin heterogeneous mixed echogenicity area within the soft tissues roughly measuring 1.4 x 1.6 x 1.8 cm. No associated vascularity. In the setting of trauma, soft tissue hematoma is  favored. IMPRESSION: 1. Negative for right lower extremity DVT. 2. right anterior shin soft tissue hematoma. Electronically Signed   By: Jerilynn Mages.  Shick M.D.   On: 09/29/2022 12:44    Procedures Procedures    Medications Ordered in ED Medications  acetaminophen (TYLENOL) tablet 1,000 mg (1,000 mg Oral Given 09/29/22 1157)  oxyCODONE (Oxy IR/ROXICODONE) immediate release tablet 5 mg (5 mg Oral Given 09/29/22 1157)    ED Course/ Medical Decision Making/ A&P                           Medical Decision Making Risk OTC drugs. Prescription drug management.   66 yo F with a cc of R leg pain after having a locker fall on it.  Had been seen at an outside facility yesterday and had x-rays performed that were reportedly negative.  Is here today to have a DVT study performed because that was what suggested at urgent care.  I discussed  with her the low likelihood that that is the cause of her pain or swelling.  Did obtain a DVT study after shared decision making at bedside.  Negative.  Will discharge home.  PCP follow-up.  12:59 PM:  I have discussed the diagnosis/risks/treatment options with the patient.  Evaluation and diagnostic testing in the emergency department does not suggest an emergent condition requiring admission or immediate intervention beyond what has been performed at this time.  They will follow up with PCP. We also discussed returning to the ED immediately if new or worsening sx occur. We discussed the sx which are most concerning (e.g., sudden worsening pain, fever, inability to tolerate by mouth) that necessitate immediate return. Medications administered to the patient during their visit and any new prescriptions provided to the patient are listed below.  Medications given during this visit Medications  acetaminophen (TYLENOL) tablet 1,000 mg (1,000 mg Oral Given 09/29/22 1157)  oxyCODONE (Oxy IR/ROXICODONE) immediate release tablet 5 mg (5 mg Oral Given 09/29/22 1157)     The  patient appears reasonably screen and/or stabilized for discharge and I doubt any other medical condition or other [facility] requiring further screening, evaluation, or treatment in the ED at this time prior to discharge.          Final Clinical Impression(s) / ED Diagnoses Final diagnoses:  Right leg pain    Rx / DC Orders ED Discharge Orders          Ordered    diclofenac Sodium (VOLTAREN) 1 % GEL  4 times daily        09/29/22 [place], Chrystian Cupples, DO 09/29/22 1259

## 2022-09-29 NOTE — ED Triage Notes (Signed)
Patient presents to ED via POV from home. Here with right leg pain after a army foot locker fell on her leg yesterday. Seen at [facility], negative xrays. Bruising noted to right shin.

## 2022-10-01 ENCOUNTER — Other Ambulatory Visit: Payer: PRIVATE HEALTH INSURANCE | Admitting: Obstetrics and Gynecology

## 2022-10-01 ENCOUNTER — Other Ambulatory Visit: Payer: PRIVATE HEALTH INSURANCE

## 2022-10-05 NOTE — Progress Notes (Signed)
HPI: FU CAD, hypertension and volume excess. CardioNet 2011 showed 4 beats of nonsustained ventricular tachycardia; also brief atrial tachycardia; note some of symptoms also noted to be sinus tachycardia. Patient was seen by Dr. Caryl Comes and started on verapamil. Her symptoms did not improve and she was changed to a beta blocker. Her symptoms improved with atenolol.  Patient has a history of cirrhosis secondary to [place]. Echocardiogram at [facility] October 2022 showed normal LV function and late positive microcavitation study consistent with extracardiac shunting.  Stress echocardiogram October 2022 suggested wall motion abnormality in the inferior wall. Cardiac CTA at [facility] November 2022 showed calcium score 53.9, 70 to 90% in the proximal to mid LAD with 30 to 50% mid to distal LAD, less than 30% first diagonal and small pericardial effusion. FFR suggested significant stenosis in the distal LAD.  Cardiac catheterization December 2022 showed 60% distal LAD.  Medical therapy recommended.  Patient underwent liver transplant at [facility] June 2023.  Transesophageal echocardiogram June 2023 at [facility] showed normal LV function and mild to moderate tricuspid regurgitation.  Since last seen, she denies dyspnea, chest pain, palpitations or syncope.  She is markedly improved overall following her recent liver transplant.  Current Outpatient Medications  Medication Sig Dispense Refill   acetaminophen (TYLENOL) 325 MG tablet Take by mouth as needed.     aspirin EC 81 MG tablet Take by mouth.     Calcium Carbonate-Vit D-Min (CALTRATE 600+D PLUS MINERALS) 600-800 MG-UNIT TABS Take 2 tablets by mouth in the morning and at bedtime.     Cholecalciferol (VITAMIN D3 PO) Take 1 capsule by mouth once a week.     diphenoxylate-atropine (LOMOTIL) 2.5-0.025 MG tablet Take by mouth.     GNP MELATONIN 3 MG TABS tablet Take 3 mg by mouth daily. 2 at bedtime     Lidocaine 4 % PTCH Apply 1 application topically daily as  needed (pain).     magnesium 30 MG tablet Take 30 mg by mouth 2 (two) times daily.     mycophenolate (CELLCEPT) 250 MG capsule SMARTSIG:4 Capsule(s) By Mouth Every 12 Hours     omeprazole (PRILOSEC) 20 MG capsule Take 20 mg by mouth daily.     predniSONE (DELTASONE) 5 MG tablet Take by mouth.     promethazine (PHENERGAN) 12.5 MG tablet 12.5 mg by oral route.     Specialty Vitamins Products (MG PLUS PROTEIN) 133 MG TABS Take by mouth.     tacrolimus (PROGRAF) 1 MG capsule Take 1 mg by mouth as directed. 46m in the morning and 4 mg at night     Current Facility-Administered Medications  Medication Dose Route Frequency Provider Last Rate Last Admin   sodium chloride flush (NS) 0.9 % injection 3 mL  3 mL Intravenous Q12H CLelon Perla MD         Past Medical History:  Diagnosis Date   Ascites    Dysrhythmia    hx of SVT   Encephalopathy, hepatic ([place] 05/13/2018   GERD (gastroesophageal reflux disease)    History of exercise stress test    03-07-2010  Stress echo--- no arrhythmias or conduction abnormalilites and negative for ischemia or chest pain   History of kidney stones    History of paroxysmal supraventricular tachycardia    episode 2011  consult w/ dr kCaryl Comes--  put on atenolol--  per pt no longer an issue   HTN (hypertension)    cardiologist --  dr cStanford Breed  IBS (irritable bowel syndrome)    diarrhea   Nonalcoholic steatohepatitis (NASH)    Numbness and tingling of left lower extremity    post achilles tendon repair   OA (osteoarthritis)    knees   Osteoporosis    Pleural effusion on right    hepatic hydrothorax   PMB (postmenopausal bleeding) none recent   thickened endometrium   PONV (postoperative nausea and vomiting)    Recurrent UTI none since 2019   Secondary esophageal varices without bleeding ([place]) 06/03/2018   Vitamin D deficiency    Wears glasses     Past Surgical History:  Procedure Laterality Date   ACHILLES TENDON SURGERY Left 06/2016   BIOPSY   11/25/2021   Procedure: BIOPSY;  Surgeon: Gatha Mayer, MD;  Location: [facility];  Service: Endoscopy;;   BREAST BIOPSY Left 02/2016   benign   CARDIAC CATHETERIZATION     COLONOSCOPY  2005   COLONOSCOPY WITH PROPOFOL N/A 11/25/2021   Procedure: COLONOSCOPY WITH PROPOFOL;  Surgeon: Gatha Mayer, MD;  [facility]: [facility];  Service: Endoscopy;  Laterality: N/A;   CT CTA CORONARY W/CA SCORE W/CM &/OR WO/CM  01/01/2014   non-obstructive calcified plaque in pLAD (0-25%), no significant incidental noncardiac findings noted   DILATATION & CURETTAGE/HYSTEROSCOPY WITH MYOSURE N/A 12/02/2020   Procedure: DILATATION & CURETTAGE/HYSTEROSCOPY WITH POLYPECTOMY AND REMOVAL OF VAGINAL LESION;  Surgeon: [name], MD;  Location: [place];  Service: Gynecology;  Laterality: N/A;  request to follow in [place] held (DR. Delilah Shan has two other cases that morning starting 7:30am)   ESOPHAGOGASTRODUODENOSCOPY  01/2014   EXTRACORPOREAL SHOCK WAVE LITHOTRIPSY  2010   KNEE ARTHROPLASTY Right 03/24/2018   Procedure: RIGHT TOTAL KNEE ARTHROPLASTY WITH COMPUTER NAVIGATION;  Surgeon: Rod Can, MD;  [facility];  Service: Orthopedics;  Laterality: Right;  Needs RNFA   KNEE ARTHROSCOPY Right 12/04/2016   Procedure: ARTHROSCOPY KNEE WITH PARTIAL MEDIAL MENISCECTOMY;  Surgeon: Rod Can, MD;  Location: [place];  Service: Orthopedics;  Laterality: Right;   LEFT HEART CATH AND CORONARY ANGIOGRAPHY N/A 11/20/2021   Procedure: LEFT HEART CATH AND CORONARY ANGIOGRAPHY;  Surgeon: Burnell Blanks, MD;  Location: [place] CV [facility];  Service: Cardiovascular;  Laterality: N/A;   LEFT HEART CATHETERIZATION WITH CORONARY ANGIOGRAM N/A 01/11/2012   Procedure: LEFT HEART CATHETERIZATION WITH CORONARY ANGIOGRAM;  Surgeon: Sherren Mocha, MD;  Location: [facility] CATH LAB;  Service: Cardiovascular;  Laterality: N/A;  widely patent coronary arterires without  significant obstructive CAD,  normal LVF, ef 55-56%   LIVER TRANSPLANTATION  06/03/2022   TONSILLECTOMY AND ADENOIDECTOMY  child   TRANSTHORACIC ECHOCARDIOGRAM  01/18/2012   mild LVH,  ef 60%/  trivial TR   TUBAL LIGATION  1985   UPPER GASTROINTESTINAL ENDOSCOPY  last done 2019    Social History   Socioeconomic History   Marital status: Married    Spouse name: Not on file   Number of children: 3   Years of education: 16   Highest education level: Bachelor's degree (e.g., BA, AB, BS)  Occupational History   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  Tobacco Use   Smoking status: Never   Smokeless tobacco: Never  Vaping Use   Vaping Use: Never used  Substance and Sexual Activity   Alcohol use: Not Currently   Drug use: No   Sexual activity: Yes    Birth control/protection: Post-menopausal    Comment: 1st intercourse 66 yo-Fewer than  5 partners  Other Topics Concern   Not on file  Social History Narrative   Married, was RN case Chief Operating Officer Care   Has daughters - one is former [place] EP NP Tree surgeon) now working in Acupuncturist   rare EtOH, never smoker, no drugs   Social Determinants of Radio broadcast assistant Strain: Not on file  Food Insecurity: Not on file  Transportation Needs: Not on file  Physical Activity: Not on file  Stress: Not on file  Social Connections: Not on file  Intimate Partner Violence: Not on file    Family History  Problem Relation Age of Onset   Lymphoma Mother        non-hodgkins - died @ 60   Coronary artery disease Mother    Coronary artery disease Father        CABG in his 54s, died @ 20   Diabetes Father    Healthy Brother    Hypertension Sister    Arthritis Sister    IgA nephropathy Daughter    Kidney failure Daughter    Celiac disease Daughter    Breast cancer Maternal Aunt    Colon cancer Neg Hx    Esophageal cancer Neg Hx    Stomach cancer Neg Hx    Rectal cancer Neg  Hx    Ovarian cancer Neg Hx    Cervical cancer Neg Hx     ROS: no fevers or chills, productive cough, hemoptysis, dysphasia, odynophagia, melena, hematochezia, dysuria, hematuria, rash, seizure activity, orthopnea, PND, pedal edema, claudication. Remaining systems are negative.  Physical Exam: Well-developed well-nourished in no acute distress.  Skin is warm and dry.  HEENT is normal.  Neck is supple.  Chest is clear to auscultation with normal expansion.  Cardiovascular exam is regular rate and rhythm.  Abdominal exam nontender or distended. No masses palpated. Extremities show no edema. neuro grossly intact  ECG-normal sinus rhythm at a rate of 82, no significant ST changes.  Personally reviewed  A/P  1 coronary artery disease-patient denies chest pain.  Continue aspirin.  Lipitor and Crestor both caused myalgias.  I will check lipids and if LDL not at goal we will consider adding Zetia.  Note given recent liver transplant we will make sure to clear any medications with her hepatologist.  2 status post liver transplant at [facility] for cirrhosis secondary to NASH-follow-up gastroenterology.  3 palpitations-Symptoms are controlled.  4 hypertension-patient's blood pressure is mildly elevated but she states it is controlled at home.  We will follow and adjust as needed.  Kirk Ruths, MD

## 2022-10-19 ENCOUNTER — Encounter: Payer: Self-pay | Admitting: Cardiology

## 2022-10-19 ENCOUNTER — Ambulatory Visit (INDEPENDENT_AMBULATORY_CARE_PROVIDER_SITE_OTHER): Payer: Medicare Other | Admitting: Cardiology

## 2022-10-19 VITALS — BP 146/90 | HR 82 | Ht 63.0 in | Wt 157.4 lb

## 2022-10-19 DIAGNOSIS — I2510 Atherosclerotic heart disease of native coronary artery without angina pectoris: Secondary | ICD-10-CM | POA: Diagnosis not present

## 2022-10-19 DIAGNOSIS — I2089 Other forms of angina pectoris: Secondary | ICD-10-CM

## 2022-10-19 DIAGNOSIS — R002 Palpitations: Secondary | ICD-10-CM | POA: Diagnosis not present

## 2022-10-19 DIAGNOSIS — I10 Essential (primary) hypertension: Secondary | ICD-10-CM

## 2022-10-19 NOTE — Patient Instructions (Signed)
  Follow-Up: At [facility], you and your health needs are our priority.  As part of our continuing mission to provide you with exceptional heart care, we have created designated Provider Care Teams.  These Care Teams include your primary Cardiologist (physician) and Advanced Practice Providers (APPs -  Physician Assistants and Nurse Practitioners) who all work together to provide you with the care you need, when you need it.  We recommend signing up for the patient portal called "MyChart".  Sign up information is provided on this After Visit Summary.  MyChart is used to connect with patients for Virtual Visits (Telemedicine).  Patients are able to view lab/test results, encounter notes, upcoming appointments, etc.  Non-urgent messages can be sent to your provider as well.   To learn more about what you can do with MyChart, go to [email or web address].    Your next appointment:   12 month(s)  The format for your next appointment:   In Person  Provider:   Kirk Ruths, MD

## 2022-10-21 LAB — LIPID PANEL
Chol/HDL Ratio: 2.4 ratio (ref 0.0–4.4)
Cholesterol, Total: 184 mg/dL (ref 100–199)
HDL: 76 mg/dL (ref >39)
LDL Chol Calc (NIH): 80 mg/dL (ref 0–99)
Triglycerides: 165 mg/dL — ABNORMAL HIGH (ref 0–149)
VLDL Cholesterol Cal: 28 mg/dL (ref 5–40)

## 2022-10-23 ENCOUNTER — Encounter: Payer: Self-pay | Admitting: Cardiology

## 2022-10-23 ENCOUNTER — Other Ambulatory Visit: Payer: Self-pay

## 2022-10-23 DIAGNOSIS — I2510 Atherosclerotic heart disease of native coronary artery without angina pectoris: Secondary | ICD-10-CM

## 2022-10-23 DIAGNOSIS — E782 Mixed hyperlipidemia: Secondary | ICD-10-CM

## 2022-10-23 MED ORDER — EZETIMIBE 10 MG PO TABS: 10.0000 mg | ORAL_TABLET | Freq: Every day | ORAL | 3 refills | Status: DC

## 2022-10-29 ENCOUNTER — Ambulatory Visit (INDEPENDENT_AMBULATORY_CARE_PROVIDER_SITE_OTHER): Payer: Medicare Other | Admitting: Obstetrics and Gynecology

## 2022-10-29 ENCOUNTER — Ambulatory Visit (INDEPENDENT_AMBULATORY_CARE_PROVIDER_SITE_OTHER): Payer: Medicare Other

## 2022-10-29 ENCOUNTER — Encounter: Payer: Self-pay | Admitting: Obstetrics and Gynecology

## 2022-10-29 VITALS — BP 134/80 | Ht 63.0 in | Wt 167.0 lb

## 2022-10-29 DIAGNOSIS — N949 Unspecified condition associated with female genital organs and menstrual cycle: Secondary | ICD-10-CM | POA: Diagnosis not present

## 2022-10-29 DIAGNOSIS — I2089 Other forms of angina pectoris: Secondary | ICD-10-CM

## 2022-10-29 DIAGNOSIS — N83201 Unspecified ovarian cyst, right side: Secondary | ICD-10-CM

## 2022-10-29 NOTE — Progress Notes (Signed)
GYNECOLOGY  VISIT   HPI: 66 y.o.   Married  Caucasian  female   [phone] with No LMP recorded. Patient is postmenopausal.   here for   follow up right ovarian cyst and elevated CA125.  Patient is followed conservatively for a right ovarian cyst and elevated CA125 attributed to liver cirrhosis and ascites.    The ovarian cyst was first noted with a CT scans and has been followed with pelvic ultrasounds.  Her original pelvic ultrasound done 10/03/20 showed the right ovarian cyst measured 6.6 x 4.5 x 5.2 cm.    Her pelvic US 02/20/21 showed the right ovarian cyst 6.4 x 4.0 cm with a 6 x 4 mm solid avascular component to the wall.  Her endometrium measured 5.8 mm.    CA125 measured 211 on 03/04/21.  This is decreased from 10/14/20 when it measured 353.   Dr. Everitt Amber of GYN ONC has consulted for the patient on 10/25/20 and recommended yearly ultrasounds and conservative management unless the cyst increases in size by 50% or develops solid components.   She recommended against surgical care for the recent development of a solid avascular nodule of the cyst wall noted on the pelvic US 02/20/21.   Last pelvic US done 09/04/21: Uterus 8.97 x 5.62 x 4.71 cm.  Fibroid 0.99 cm.  EMS 3.42 mm.  Right ovary small with adjacent cyst noted 6.8 x 4.5 cm, slightly larger since last [place] 02/20/21. No nodules noted.  Left ovary atrophic.  No adnexal masses.  7.5 cm pocket of free fluid noted.    Had liver transplant 06-03-22. Some upper abdominal right sided pain following her transplant.  Denies vaginal bleeding.  Some early morning hot flashes.  No pelvic pain.    PCP has been doing AEX and paps.  GYNECOLOGIC HISTORY: No LMP recorded. Patient is postmenopausal. Contraception:  postmenopausal Menopausal hormone therapy:  n/a Last mammogram: 05-07-22 Diag.Bil--SEE Epic.  She is due for follow up in December.  Last pap smear:   03-03-22 normal per patient with PCP         OB History     Gravida  3    Para  3   Term  3   Preterm      AB      Living  3      SAB      IAB      Ectopic      Multiple      Live Births                 Patient Active Problem List   Diagnosis Date Noted   Liver transplant status ([place]) 08/12/2022   Coronary artery disease involving native coronary artery of native heart without angina pectoris    Adnexal mass 10/25/2020   Elevated cancer antigen 125 (CA 125) 10/25/2020   Thickened endometrium 10/25/2020   Recurrent UTI 06/12/2019   Secondary insomnia 04/10/2019   Anemia 02/28/2019   Degeneration of lumbar intervertebral disc 08/06/2018   History of paroxysmal supraventricular tachycardia    Liver cirrhosis secondary to NASH ([place]) 04/23/2018   Irritable bowel syndrome-diarrhea predominant 06/15/2017   Gastroesophageal reflux disease 06/15/2017   Hyperlipidemia    Kidney stone    Essential hypertension 02/13/2010    Past Medical History:  Diagnosis Date   Ascites    Dysrhythmia    hx of SVT   Encephalopathy, hepatic ([place]) 05/13/2018   GERD (gastroesophageal reflux disease)    History of exercise  stress test    03-07-2010  Stress echo--- no arrhythmias or conduction abnormalilites and negative for ischemia or chest pain   History of kidney stones    History of paroxysmal supraventricular tachycardia    episode 2011  consult w/ dr Caryl Comes --  put on atenolol--  per pt no longer an issue   HTN (hypertension)    cardiologist --  dr Stanford Breed   IBS (irritable bowel syndrome)    diarrhea   Nonalcoholic steatohepatitis (NASH)    Numbness and tingling of left lower extremity    post achilles tendon repair   OA (osteoarthritis)    knees   Osteoporosis    Pleural effusion on right    hepatic hydrothorax   PMB (postmenopausal bleeding) none recent   thickened endometrium   PONV (postoperative nausea and vomiting)    Recurrent UTI none since 2019   Secondary esophageal varices without bleeding ([place]) 06/03/2018   Vitamin D  deficiency    Wears glasses     Past Surgical History:  Procedure Laterality Date   ACHILLES TENDON SURGERY Left 06/2016   BIOPSY  11/25/2021   Procedure: BIOPSY;  Surgeon: Gatha Mayer, MD;  [facility];  Service: Endoscopy;;   BREAST BIOPSY Left 02/2016   benign   CARDIAC CATHETERIZATION     COLONOSCOPY  2005   COLONOSCOPY WITH PROPOFOL N/A 11/25/2021   Procedure: COLONOSCOPY WITH PROPOFOL;  Surgeon: Gatha Mayer, MD;  [facility];  Service: Endoscopy;  Laterality: N/A;   CT CTA CORONARY W/CA SCORE W/CM &/OR WO/CM  01/01/2014   non-obstructive calcified plaque in pLAD (0-25%), no significant incidental noncardiac findings noted   DILATATION & CURETTAGE/HYSTEROSCOPY WITH MYOSURE N/A 12/02/2020   Procedure: DILATATION & CURETTAGE/HYSTEROSCOPY WITH POLYPECTOMY AND REMOVAL OF VAGINAL LESION;  Surgeon: [name], MD;  Location: [place];  Service: Gynecology;  Laterality: N/A;  request to follow in [place] held (DR. Delilah Shan has two other cases that morning starting 7:30am)   ESOPHAGOGASTRODUODENOSCOPY  01/2014   EXTRACORPOREAL SHOCK WAVE LITHOTRIPSY  2010   KNEE ARTHROPLASTY Right 03/24/2018   Procedure: RIGHT TOTAL KNEE ARTHROPLASTY WITH COMPUTER NAVIGATION;  Surgeon: Rod Can, MD;  [facility];  Service: Orthopedics;  Laterality: Right;  Needs RNFA   KNEE ARTHROSCOPY Right 12/04/2016   Procedure: ARTHROSCOPY KNEE WITH PARTIAL MEDIAL MENISCECTOMY;  Surgeon: Rod Can, MD;  Location: [place];  Service: Orthopedics;  Laterality: Right;   LEFT HEART CATH AND CORONARY ANGIOGRAPHY N/A 11/20/2021   Procedure: LEFT HEART CATH AND CORONARY ANGIOGRAPHY;  Surgeon: Burnell Blanks, MD;  Location: [place] [facility];  Service: Cardiovascular;  Laterality: N/A;   LEFT HEART CATHETERIZATION WITH CORONARY ANGIOGRAM N/A 01/11/2012   Procedure: LEFT HEART CATHETERIZATION WITH CORONARY ANGIOGRAM;   Surgeon: Sherren Mocha, MD;  Location: [facility] CATH LAB;  Service: Cardiovascular;  Laterality: N/A;  widely patent coronary arterires without significant obstructive CAD,  normal LVF, ef 55-56%   LIVER TRANSPLANTATION  06/03/2022   TONSILLECTOMY AND ADENOIDECTOMY  child   TRANSTHORACIC ECHOCARDIOGRAM  01/18/2012   mild LVH,  ef 60%/  trivial TR   TUBAL LIGATION  1985   UPPER GASTROINTESTINAL ENDOSCOPY  last done 2019    Current Outpatient Medications  Medication Sig Dispense Refill   acetaminophen (TYLENOL) 325 MG tablet Take by mouth as needed.     aspirin EC 81 MG tablet Take by mouth.     Calcium Carbonate-Vit D-Min (CALTRATE 600+D PLUS MINERALS) 600-800  MG-UNIT TABS Take 2 tablets by mouth in the morning and at bedtime.     Cholecalciferol (VITAMIN D3 PO) Take 1 capsule by mouth once a week.     diphenoxylate-atropine (LOMOTIL) 2.5-0.025 MG tablet Take by mouth.     ezetimibe (ZETIA) 10 MG tablet Take 1 tablet (10 mg total) by mouth daily. 90 tablet 3   GNP MELATONIN 3 MG TABS tablet Take 3 mg by mouth daily. 2 at bedtime     Lidocaine 4 % PTCH Apply 1 application topically daily as needed (pain).     magnesium 30 MG tablet Take 30 mg by mouth 2 (two) times daily.     mycophenolate (CELLCEPT) 250 MG capsule SMARTSIG:4 Capsule(s) By Mouth Every 12 Hours     omeprazole (PRILOSEC) 20 MG capsule Take 20 mg by mouth daily.     predniSONE (DELTASONE) 5 MG tablet Take by mouth.     promethazine (PHENERGAN) 12.5 MG tablet 12.5 mg by oral route.     Specialty Vitamins Products (MG PLUS PROTEIN) 133 MG TABS Take by mouth.     tacrolimus (PROGRAF) 1 MG capsule Take 1 mg by mouth as directed. 69m in the morning and 4 mg at night     Current Facility-Administered Medications  Medication Dose Route Frequency Provider Last Rate Last Admin   sodium chloride flush (NS) 0.9 % injection 3 mL  3 mL Intravenous Q12H Crenshaw, BDenice Bors MD         ALLERGIES: Rosuvastatin and Simvastatin  Family History   Problem Relation Age of Onset   Lymphoma Mother        non-hodgkins - died @ 886  Coronary artery disease Mother    Coronary artery disease Father        CABG in his 655s died @ 774  Diabetes Father    Healthy Brother    Hypertension Sister    Arthritis Sister    IgA nephropathy Daughter    Kidney failure Daughter    Celiac disease Daughter    Breast cancer Maternal Aunt    Colon cancer Neg Hx    Esophageal cancer Neg Hx    Stomach cancer Neg Hx    Rectal cancer Neg Hx    Ovarian cancer Neg Hx    Cervical cancer Neg Hx     Social History   Socioeconomic History   Marital status: Married    Spouse name: Not on file   Number of children: 3   Years of education: 16   Highest education level: Bachelor's degree (e.g., BA, AB, BS)  Occupational History   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 Tobacco Use   Smoking status: Never   Smokeless tobacco: Never  Vaping Use   Vaping Use: Never used  Substance and Sexual Activity   Alcohol use: Not Currently   Drug use: No   Sexual activity: Yes    Birth control/protection: Post-menopausal    Comment: 1st intercourse 66yo-Fewer than 5 partners  Other Topics Concern   Not on file  Social History Narrative   Married, was RN case manager [place]  Has daughters - one is former CRivaNP (AAdministrator, arts now working in mAcupuncturist  rare EtOH, never smoker, no drugs   Social Determinants of HRadio broadcast assistantStrain: Not on file  Food Insecurity: Not on file  Transportation Needs: Not on file  Physical Activity: Not  on file  Stress: Not on file  Social Connections: Not on file  Intimate Partner Violence: Not on file    Review of Systems  All other systems reviewed and are negative.   PHYSICAL EXAMINATION:    BP 134/80 (BP Location: Right Arm, Patient Position: Sitting, Cuff Size: Normal)   Ht 5' 3"  (1.6 m)   Wt 167 lb (75.8 kg)   BMI 29.58 kg/m      General appearance: alert, cooperative and appears stated age  Pelvic US - transvaginal and transabdominal.  Uterus 7.7 cm x 4.53 x 4.29 cm.  Fibroid 0.94 cm, calcified. EMS 2.31 mm.  Left ovary 1.90 x 1.11 x 1.25 cm.   Atrophic. Right ovary 1.81 x 1.31 x 1.32 cm.  Atrophic. Cystic fluid collection midline to right adnexa, 6.4 x 4.5 cm. No change.  Seen best abdominally.  Small free fluid in pelvis, 4 cm.   ASSESSMENT  Adnexal cyst.  Stable.   PLAN  [place] images and report reviewed.  Prior GYN ONC note from Dr. Denman George reviewed.  Will check adnexal cyst annually with pelvic exam and pelvic US.  No intervention recommended at this time due to stability. No further CA125 due to risk of false positives.    An After Visit Summary was printed and given to the patient.  24 min  total time was spent for this patient encounter, including preparation, face-to-face counseling with the patient, coordination of care, and documentation of the encounter.

## 2022-11-02 ENCOUNTER — Ambulatory Visit: Payer: Medicare Other | Attending: Family | Admitting: Physical Therapy

## 2022-11-02 ENCOUNTER — Other Ambulatory Visit: Payer: Self-pay

## 2022-11-02 DIAGNOSIS — M546 Pain in thoracic spine: Secondary | ICD-10-CM | POA: Insufficient documentation

## 2022-11-02 DIAGNOSIS — M6281 Muscle weakness (generalized): Secondary | ICD-10-CM | POA: Diagnosis present

## 2022-11-02 NOTE — Therapy (Signed)
OUTPATIENT PHYSICAL THERAPY THORACOLUMBAR EVALUATION   Patient Name: Dawn Moon MRN: [id number] DOB:September 27, 1956, 66 y.o., female Today's Date: 11/02/2022   PT End of Session - 11/02/22 0851     Visit Number 1    Number of Visits 6    Date for PT Re-Evaluation 12/14/22    Authorization Type FOTO AT LEAST EVERY 5TH VISIT.  PROGRESS NOTE AT 10TH VISIT.  KX MODIFIER AFTER 15 VISITS.    PT Start Time [phone]    PT Stop Time 0901    PT Time Calculation (min) 44 min    Activity Tolerance Patient tolerated treatment well    Behavior During Therapy WFL for tasks assessed/performed             Past Medical History:  Diagnosis Date   Ascites    Dysrhythmia    hx of SVT   Encephalopathy, hepatic ([place]) 05/13/2018   GERD (gastroesophageal reflux disease)    History of exercise stress test    03-07-2010  Stress echo--- no arrhythmias or conduction abnormalilites and negative for ischemia or chest pain   History of kidney stones    History of paroxysmal supraventricular tachycardia    episode 2011  consult w/ dr Caryl Comes --  put on atenolol--  per pt no longer an issue   HTN (hypertension)    cardiologist --  dr Stanford Breed   IBS (irritable bowel syndrome)    diarrhea   Nonalcoholic steatohepatitis (NASH)    Numbness and tingling of left lower extremity    post achilles tendon repair   OA (osteoarthritis)    knees   Osteoporosis    Pleural effusion on right    hepatic hydrothorax   PMB (postmenopausal bleeding) none recent   thickened endometrium   PONV (postoperative nausea and vomiting)    Recurrent UTI none since 2019   Secondary esophageal varices without bleeding ([place]) 06/03/2018   Vitamin D deficiency    Wears glasses    Past Surgical History:  Procedure Laterality Date   ACHILLES TENDON SURGERY Left 06/2016   BIOPSY  11/25/2021   Procedure: BIOPSY;  Surgeon: Gatha Mayer, MD;  [facility];  Service: Endoscopy;;   BREAST BIOPSY Left 02/2016    benign   CARDIAC CATHETERIZATION     COLONOSCOPY  2005   COLONOSCOPY WITH PROPOFOL N/A 11/25/2021   Procedure: COLONOSCOPY WITH PROPOFOL;  Surgeon: Gatha Mayer, MD;  [facility];  Service: Endoscopy;  Laterality: N/A;   CT CTA CORONARY W/CA SCORE W/CM &/OR WO/CM  01/01/2014   non-obstructive calcified plaque in pLAD (0-25%), no significant incidental noncardiac findings noted   DILATATION & CURETTAGE/HYSTEROSCOPY WITH MYOSURE N/A 12/02/2020   Procedure: DILATATION & CURETTAGE/HYSTEROSCOPY WITH POLYPECTOMY AND REMOVAL OF VAGINAL LESION;  Surgeon: [name], MD;  Location: [place];  Service: Gynecology;  Laterality: N/A;  request to follow in [place] held (DR. Delilah Shan has two other cases that morning starting 7:30am)   ESOPHAGOGASTRODUODENOSCOPY  01/2014   EXTRACORPOREAL SHOCK WAVE LITHOTRIPSY  2010   KNEE ARTHROPLASTY Right 03/24/2018   Procedure: RIGHT TOTAL KNEE ARTHROPLASTY WITH COMPUTER NAVIGATION;  Surgeon: Rod Can, MD;  [facility];  Service: Orthopedics;  Laterality: Right;  Needs RNFA   KNEE ARTHROSCOPY Right 12/04/2016   Procedure: ARTHROSCOPY KNEE WITH PARTIAL MEDIAL MENISCECTOMY;  Surgeon: Rod Can, MD;  Location: [place];  Service: Orthopedics;  Laterality: Right;   LEFT HEART CATH AND CORONARY ANGIOGRAPHY N/A 11/20/2021   Procedure:  LEFT HEART CATH AND CORONARY ANGIOGRAPHY;  Surgeon: Burnell Blanks, MD;  Location: [place] [facility];  Service: Cardiovascular;  Laterality: N/A;   LEFT HEART CATHETERIZATION WITH CORONARY ANGIOGRAM N/A 01/11/2012   Procedure: LEFT HEART CATHETERIZATION WITH CORONARY ANGIOGRAM;  Surgeon: Sherren Mocha, MD;  Location: [facility] C[facility];  Service: Cardiovascular;  Laterality: N/A;  widely patent coronary arterires without significant obstructive CAD,  normal LVF, ef 55-56%   LIVER TRANSPLANTATION  06/03/2022   TONSILLECTOMY AND ADENOIDECTOMY  child    TRANSTHORACIC ECHOCARDIOGRAM  01/18/2012   mild LVH,  ef 60%/  trivial TR   TUBAL LIGATION  1985   UPPER GASTROINTESTINAL ENDOSCOPY  last done 2019   Patient Active Problem List   Diagnosis Date Noted   Liver transplant status ([place]) 08/12/2022   Coronary artery disease involving native coronary artery of native heart without angina pectoris    Adnexal mass 10/25/2020   Elevated cancer antigen 125 (CA 125) 10/25/2020   Thickened endometrium 10/25/2020   Recurrent UTI 06/12/2019   Secondary insomnia 04/10/2019   Anemia 02/28/2019   Degeneration of lumbar intervertebral disc 08/06/2018   History of paroxysmal supraventricular tachycardia    Liver cirrhosis secondary to NASH ([place]) 04/23/2018   Irritable bowel syndrome-diarrhea predominant 06/15/2017   Gastroesophageal reflux disease 06/15/2017   Hyperlipidemia    Kidney stone    Essential hypertension 02/13/2010     REFERRING PROVIDER: Benedetto Goad NP  REFERRING DIAG: Thoracic back pain  Rationale for Evaluation and Treatment: Rehabilitation  THERAPY DIAG:  Pain in thoracic spine  ONSET DATE: ~a month.  SUBJECTIVE:                                                                                                                                                                                           SUBJECTIVE STATEMENT: The patient presents to the clinic today with c/o right sided back pain.  She states she inured her right LE about a month ago and this caused her to walk differnetly which she feels may have contributed significantly to her back pain.  At rest today she reports a pain-level of 4/10.  However, with performing ADL's, lifting and bending her pain will rise to a 7-8+/10.  Her pain is described as an ache and sore.    PERTINENT HISTORY:  Liver transplant, OP, OA, left Achilles repair, right TKA.  PAIN:  Are you having pain? Yes: NPRS scale: 4/10 Pain location: Right lower thoracic. Pain description: As  above. Aggravating factors: ADL performance Relieving factors: Heat and Lidocaine patches.  PRECAUTIONS: Other: OP.  WEIGHT BEARING RESTRICTIONS: No  FALLS:  Has patient  fallen in last 6 months? No  LIVING ENVIRONMENT: Lives in: House/apartment Has following equipment at home: None  OCCUPATION: Disabled.  PLOF: Independent with basic ADLs  PATIENT GOALS: Be out of pain.  NEXT MD VISIT:   OBJECTIVE:   PATIENT SURVEYS:  FOTO       POSTURE: rounded shoulders and forward head  PALPATION: Very tender to palpation right of T10 to T12.   ROM:   Bilateral U and LE ROM is WF/NL.  LOWER EXTREMITY MMT:    Patient able to perform normal strength values via MMTing to her bilateral U and LE's.    GAIT: Slow and purposeful gait without assistive device.  TODAY'S TREATMENT:                                                                                                                              DATE: HMP and IFC at 80-150 Hz on 40% scan x 20 minutes to patient's right lower thoracic region.  Patient enjoyed treatment and felt better following with normal modality response following removal of modality.     ASSESSMENT:  CLINICAL IMPRESSION: The patient presents to OPPT with c/o right-sided lower thoracic pain.  She was found to be very palpably tender from T10 to T12.  U and LE strength and range of motion is essentially normal.  Her pain prohibits her from performing ADL's.  Her gait is slow and purposeful related to a right LE injury she sustained just prior to the onset of her thoracic pain. Patient will benefit from skilled physical therapy intervention to address pain and deficits.  OBJECTIVE IMPAIRMENTS: decreased activity tolerance, postural dysfunction, and pain.   ACTIVITY LIMITATIONS: carrying and lifting  PARTICIPATION LIMITATIONS: meal prep, cleaning, and laundry  REHAB POTENTIAL: Excellent  CLINICAL DECISION MAKING: Stable/uncomplicated  EVALUATION  COMPLEXITY: Low   GOALS:  LONG TERM GOALS: Target date: 12/14/2022  Ind with a HEP. Baseline:  Goal status: INITIAL  2.  Perform ADL's with pain not > 2-3/10. Baseline:  Goal status: INITIAL  PLAN:  PT FREQUENCY: 2x/week  PT DURATION: 3 weeks  PLANNED INTERVENTIONS: Therapeutic exercises, Therapeutic activity, Patient/Family education, Self Care, Electrical stimulation, Cryotherapy, Moist heat, Ultrasound, and Manual therapy.  PLAN FOR NEXT SESSION: Combo e'stim/US, STW/M, core exercise progression, body mechanics.   Zachrey Deutscher, [place], PT 11/02/2022, 10:20 AM

## 2022-11-04 ENCOUNTER — Encounter: Payer: Self-pay | Admitting: Physical Therapy

## 2022-11-04 ENCOUNTER — Ambulatory Visit: Payer: Medicare Other | Admitting: Physical Therapy

## 2022-11-04 DIAGNOSIS — M546 Pain in thoracic spine: Secondary | ICD-10-CM | POA: Diagnosis not present

## 2022-11-04 DIAGNOSIS — M6281 Muscle weakness (generalized): Secondary | ICD-10-CM

## 2022-11-04 NOTE — Therapy (Signed)
OUTPATIENT PHYSICAL THERAPY THORACOLUMBAR TREATMENT   Patient Name: Dawn Moon MRN: [id number] DOB:1955-12-24, 66 y.o., female Today's Date: 11/04/2022   PT End of Session - 11/04/22 0816     Visit Number 2    Number of Visits 6    Date for PT Re-Evaluation 12/14/22    Authorization Type FOTO AT LEAST EVERY 5TH VISIT.  PROGRESS NOTE AT 10TH VISIT.  KX MODIFIER AFTER 15 VISITS.    PT Start Time [phone]    PT Stop Time 0900    PT Time Calculation (min) 43 min    Activity Tolerance Patient tolerated treatment well    Behavior During Therapy WFL for tasks assessed/performed            Past Medical History:  Diagnosis Date   Ascites    Dysrhythmia    hx of SVT   Encephalopathy, hepatic ([place]) 05/13/2018   GERD (gastroesophageal reflux disease)    History of exercise stress test    03-07-2010  Stress echo--- no arrhythmias or conduction abnormalilites and negative for ischemia or chest pain   History of kidney stones    History of paroxysmal supraventricular tachycardia    episode 2011  consult w/ dr Caryl Comes --  put on atenolol--  per pt no longer an issue   HTN (hypertension)    cardiologist --  dr Stanford Breed   IBS (irritable bowel syndrome)    diarrhea   Nonalcoholic steatohepatitis (NASH)    Numbness and tingling of left lower extremity    post achilles tendon repair   OA (osteoarthritis)    knees   Osteoporosis    Pleural effusion on right    hepatic hydrothorax   PMB (postmenopausal bleeding) none recent   thickened endometrium   PONV (postoperative nausea and vomiting)    Recurrent UTI none since 2019   Secondary esophageal varices without bleeding ([place]) 06/03/2018   Vitamin D deficiency    Wears glasses    Past Surgical History:  Procedure Laterality Date   ACHILLES TENDON SURGERY Left 06/2016   BIOPSY  11/25/2021   Procedure: BIOPSY;  Surgeon: Gatha Mayer, MD;  [facility];  Service: Endoscopy;;   BREAST BIOPSY Left 02/2016   benign    CARDIAC CATHETERIZATION     COLONOSCOPY  2005   COLONOSCOPY WITH PROPOFOL N/A 11/25/2021   Procedure: COLONOSCOPY WITH PROPOFOL;  Surgeon: Gatha Mayer, MD;  [facility];  Service: Endoscopy;  Laterality: N/A;   CT CTA CORONARY W/CA SCORE W/CM &/OR WO/CM  01/01/2014   non-obstructive calcified plaque in pLAD (0-25%), no significant incidental noncardiac findings noted   DILATATION & CURETTAGE/HYSTEROSCOPY WITH MYOSURE N/A 12/02/2020   Procedure: DILATATION & CURETTAGE/HYSTEROSCOPY WITH POLYPECTOMY AND REMOVAL OF VAGINAL LESION;  Surgeon: [name], MD;  Location: [place];  Service: Gynecology;  Laterality: N/A;  request to follow in [place] held (DR. Delilah Shan has two other cases that morning starting 7:30am)   ESOPHAGOGASTRODUODENOSCOPY  01/2014   EXTRACORPOREAL SHOCK WAVE LITHOTRIPSY  2010   KNEE ARTHROPLASTY Right 03/24/2018   Procedure: RIGHT TOTAL KNEE ARTHROPLASTY WITH COMPUTER NAVIGATION;  Surgeon: Rod Can, MD;  [facility];  Service: Orthopedics;  Laterality: Right;  Needs RNFA   KNEE ARTHROSCOPY Right 12/04/2016   Procedure: ARTHROSCOPY KNEE WITH PARTIAL MEDIAL MENISCECTOMY;  Surgeon: Rod Can, MD;  Location: [facility];  Service: Orthopedics;  Laterality: Right;   LEFT HEART CATH AND CORONARY ANGIOGRAPHY N/A 11/20/2021   Procedure: LEFT  HEART CATH AND CORONARY ANGIOGRAPHY;  Surgeon: Burnell Blanks, MD;  Location: [place] [facility];  Service: Cardiovascular;  Laterality: N/A;   LEFT HEART CATHETERIZATION WITH CORONARY ANGIOGRAM N/A 01/11/2012   Procedure: LEFT HEART CATHETERIZATION WITH CORONARY ANGIOGRAM;  Surgeon: Sherren Mocha, MD;  Location: [facility];  Service: Cardiovascular;  Laterality: N/A;  widely patent coronary arterires without significant obstructive CAD,  normal LVF, ef 55-56%   LIVER TRANSPLANTATION  06/03/2022   TONSILLECTOMY AND ADENOIDECTOMY  child   TRANSTHORACIC  ECHOCARDIOGRAM  01/18/2012   mild LVH,  ef 60%/  trivial TR   TUBAL LIGATION  1985   UPPER GASTROINTESTINAL ENDOSCOPY  last done 2019   Patient Active Problem List   Diagnosis Date Noted   Liver transplant status ([place]) 08/12/2022   Coronary artery disease involving native coronary artery of native heart without angina pectoris    Adnexal mass 10/25/2020   Elevated cancer antigen 125 (CA 125) 10/25/2020   Thickened endometrium 10/25/2020   Recurrent UTI 06/12/2019   Secondary insomnia 04/10/2019   Anemia 02/28/2019   Degeneration of lumbar intervertebral disc 08/06/2018   History of paroxysmal supraventricular tachycardia    Liver cirrhosis secondary to NASH ([place]) 04/23/2018   Irritable bowel syndrome-diarrhea predominant 06/15/2017   Gastroesophageal reflux disease 06/15/2017   Hyperlipidemia    Kidney stone    Essential hypertension 02/13/2010   REFERRING PROVIDER: Benedetto Goad NP  REFERRING DIAG: Thoracic back pain  Rationale for Evaluation and Treatment: Rehabilitation  THERAPY DIAG:  Pain in thoracic spine  Muscle weakness (generalized)  ONSET DATE: ~a month.  SUBJECTIVE:                                                                                                                                                                                           SUBJECTIVE STATEMENT: Reports that pain is in mid back. Pain is outshadowed by R foot pain today.  PERTINENT HISTORY:  Liver transplant, OP, OA, left Achilles repair, right TKA.  PAIN:  Are you having pain? Yes: NPRS scale: 1/10 Pain location: Right lower thoracic. Pain description: As above. Aggravating factors: ADL performance Relieving factors: Heat and Lidocaine patches.  PRECAUTIONS: Other: OP.  WEIGHT BEARING RESTRICTIONS: No  FALLS:  Has patient fallen in last 6 months? No  LIVING ENVIRONMENT: Lives in: House/apartment Has following equipment at home: None  OCCUPATION: Disabled.  PLOF:  Independent with basic ADLs  PATIENT GOALS: Be out of pain.  NEXT MD VISIT:   OBJECTIVE:   PATIENT SURVEYS:  FOTO    POSTURE: rounded shoulders and forward head  PALPATION: Very tender to palpation right of  T10 to T12.   ROM:  Bilateral U and LE ROM is WF/NL.  LOWER EXTREMITY MMT:   Patient able to perform normal strength values via MMTing to her bilateral U and LE's.  GAIT: Slow and purposeful gait without assistive device.  TODAY'S TREATMENT:                                                                                                                              DATE:   Modalities  Date:  Unattended Estim: Lumbar, Pre-Mod, 15 mins, Pain and Tone Combo: Lumbar, 1.5 w/cm2, 100%, 10 mins, Pain and Tone  Manual Therapy Soft Tissue Mobilization: R QL, thoracolumbar paraspinals, to reduce tone and pain    ASSESSMENT:  CLINICAL IMPRESSION: Patient presented in clinic with reports of mild mid LBP compared to R foot which has a stress fracture. Patient able to tolerate conservative treatment with mild tenderness to R QL, thoracolumbar paraspinals. No complaints of increased LBP with conservative treatment. Normal modalities response noted following removal of the modalities.  OBJECTIVE IMPAIRMENTS: decreased activity tolerance, postural dysfunction, and pain.   ACTIVITY LIMITATIONS: carrying and lifting  PARTICIPATION LIMITATIONS: meal prep, cleaning, and laundry  REHAB POTENTIAL: Excellent  CLINICAL DECISION MAKING: Stable/uncomplicated  EVALUATION COMPLEXITY: Low   GOALS:  LONG TERM GOALS: Target date: 12/16/2022  Ind with a HEP. Baseline:  Goal status: INITIAL  2.  Perform ADL's with pain not > 2-3/10. Baseline:  Goal status: INITIAL  PLAN:  PT FREQUENCY: 2x/week  PT DURATION: 3 weeks  PLANNED INTERVENTIONS: Therapeutic exercises, Therapeutic activity, Patient/Family education, Self Care, Electrical stimulation, Cryotherapy, Moist heat,  Ultrasound, and Manual therapy.  PLAN FOR NEXT SESSION: Combo e'stim/US, STW/M, core exercise progression, body mechanics.   Standley Brooking, PTA 11/04/2022, 10:57 AM

## 2022-11-09 ENCOUNTER — Other Ambulatory Visit: Payer: Self-pay | Admitting: Family Medicine

## 2022-11-09 ENCOUNTER — Ambulatory Visit: Payer: Medicare Other

## 2022-11-09 ENCOUNTER — Ambulatory Visit
Admission: RE | Admit: 2022-11-09 | Discharge: 2022-11-09 | Disposition: A | Discharge status: Home / Self Care | Payer: Medicare Other | Source: Ambulatory Visit | Attending: Family Medicine | Admitting: Family Medicine

## 2022-11-09 DIAGNOSIS — M546 Pain in thoracic spine: Secondary | ICD-10-CM

## 2022-11-09 DIAGNOSIS — S36039A Unspecified laceration of spleen, initial encounter: Secondary | ICD-10-CM | POA: Diagnosis not present

## 2022-11-09 DIAGNOSIS — R921 Mammographic calcification found on diagnostic imaging of breast: Secondary | ICD-10-CM

## 2022-11-09 DIAGNOSIS — S22039A Unspecified fracture of third thoracic vertebra, initial encounter for closed fracture: Secondary | ICD-10-CM | POA: Diagnosis not present

## 2022-11-09 DIAGNOSIS — M6281 Muscle weakness (generalized): Secondary | ICD-10-CM

## 2022-11-09 NOTE — Therapy (Signed)
OUTPATIENT PHYSICAL THERAPY THORACOLUMBAR TREATMENT   Patient Name: Dawn Moon MRN: [id number] DOB:Jun 21, 1956, 66 y.o., female Today's Date: 11/09/2022   PT End of Session - 11/09/22 1317     Visit Number 3    Number of Visits 6    Date for PT Re-Evaluation 12/14/22    Authorization Type FOTO AT LEAST EVERY 5TH VISIT.  PROGRESS NOTE AT 10TH VISIT.  KX MODIFIER AFTER 15 VISITS.    PT Start Time 8938    PT Stop Time 1346    PT Time Calculation (min) 41 min    Activity Tolerance Patient tolerated treatment well    Behavior During Therapy WFL for tasks assessed/performed            Past Medical History:  Diagnosis Date   Ascites    Dysrhythmia    hx of SVT   Encephalopathy, hepatic ([place]) 05/13/2018   GERD (gastroesophageal reflux disease)    History of exercise stress test    03-07-2010  Stress echo--- no arrhythmias or conduction abnormalilites and negative for ischemia or chest pain   History of kidney stones    History of paroxysmal supraventricular tachycardia    episode 2011  consult w/ dr Caryl Comes --  put on atenolol--  per pt no longer an issue   HTN (hypertension)    cardiologist --  dr Stanford Breed   IBS (irritable bowel syndrome)    diarrhea   Nonalcoholic steatohepatitis (NASH)    Numbness and tingling of left lower extremity    post achilles tendon repair   OA (osteoarthritis)    knees   Osteoporosis    Pleural effusion on right    hepatic hydrothorax   PMB (postmenopausal bleeding) none recent   thickened endometrium   PONV (postoperative nausea and vomiting)    Recurrent UTI none since 2019   Secondary esophageal varices without bleeding ([place]) 06/03/2018   Vitamin D deficiency    Wears glasses    Past Surgical History:  Procedure Laterality Date   ACHILLES TENDON SURGERY Left 06/2016   BIOPSY  11/25/2021   Procedure: BIOPSY;  Surgeon: Gatha Mayer, MD;  [facility];  Service: Endoscopy;;   BREAST BIOPSY Left 02/2016   benign    CARDIAC CATHETERIZATION     COLONOSCOPY  2005   COLONOSCOPY WITH PROPOFOL N/A 11/25/2021   Procedure: COLONOSCOPY WITH PROPOFOL;  Surgeon: Gatha Mayer, MD;  [facility];  Service: Endoscopy;  Laterality: N/A;   CT CTA CORONARY W/CA SCORE W/CM &/OR WO/CM  01/01/2014   non-obstructive calcified plaque in pLAD (0-25%), no significant incidental noncardiac findings noted   DILATATION & CURETTAGE/HYSTEROSCOPY WITH MYOSURE N/A 12/02/2020   Procedure: DILATATION & CURETTAGE/HYSTEROSCOPY WITH POLYPECTOMY AND REMOVAL OF VAGINAL LESION;  Surgeon: [name], MD;  Location: [place];  Service: Gynecology;  Laterality: N/A;  request to follow in [place] held (DR. Delilah Shan has two other cases that morning starting 7:30am)   ESOPHAGOGASTRODUODENOSCOPY  01/2014   EXTRACORPOREAL SHOCK WAVE LITHOTRIPSY  2010   KNEE ARTHROPLASTY Right 03/24/2018   Procedure: RIGHT TOTAL KNEE ARTHROPLASTY WITH COMPUTER NAVIGATION;  Surgeon: Rod Can, MD;  [facility];  Service: Orthopedics;  Laterality: Right;  Needs RNFA   KNEE ARTHROSCOPY Right 12/04/2016   Procedure: ARTHROSCOPY KNEE WITH PARTIAL MEDIAL MENISCECTOMY;  Surgeon: Rod Can, MD;  Location: [place];  Service: Orthopedics;  Laterality: Right;   LEFT HEART CATH AND CORONARY ANGIOGRAPHY N/A 11/20/2021   Procedure: LEFT  HEART CATH AND CORONARY ANGIOGRAPHY;  Surgeon: Burnell Blanks, MD;  Location: [place] [facility];  Service: Cardiovascular;  Laterality: N/A;   LEFT HEART CATHETERIZATION WITH CORONARY ANGIOGRAM N/A 01/11/2012   Procedure: LEFT HEART CATHETERIZATION WITH CORONARY ANGIOGRAM;  Surgeon: Sherren Mocha, MD;  Location: [facility] C[facility] LAB;  Service: Cardiovascular;  Laterality: N/A;  widely patent coronary arterires without significant obstructive CAD,  normal LVF, ef 55-56%   LIVER TRANSPLANTATION  06/03/2022   TONSILLECTOMY AND ADENOIDECTOMY  child   TRANSTHORACIC  ECHOCARDIOGRAM  01/18/2012   mild LVH,  ef 60%/  trivial TR   TUBAL LIGATION  1985   UPPER GASTROINTESTINAL ENDOSCOPY  last done 2019   Patient Active Problem List   Diagnosis Date Noted   Liver transplant status ([place]) 08/12/2022   Coronary artery disease involving native coronary artery of native heart without angina pectoris    Adnexal mass 10/25/2020   Elevated cancer antigen 125 (CA 125) 10/25/2020   Thickened endometrium 10/25/2020   Recurrent UTI 06/12/2019   Secondary insomnia 04/10/2019   Anemia 02/28/2019   Degeneration of lumbar intervertebral disc 08/06/2018   History of paroxysmal supraventricular tachycardia    Liver cirrhosis secondary to NASH ([place]) 04/23/2018   Irritable bowel syndrome-diarrhea predominant 06/15/2017   Gastroesophageal reflux disease 06/15/2017   Hyperlipidemia    Kidney stone    Essential hypertension 02/13/2010   REFERRING PROVIDER: Benedetto Goad NP  REFERRING DIAG: Thoracic back pain  Rationale for Evaluation and Treatment: Rehabilitation  THERAPY DIAG:  Pain in thoracic spine  Muscle weakness (generalized)  ONSET DATE: ~a month.  SUBJECTIVE:                                                                                                                                                                                           SUBJECTIVE STATEMENT: Patient reports that her back is sore today. She is scheduled to get a injection for her pain tomorrow.   PERTINENT HISTORY:  Liver transplant, OP, OA, left Achilles repair, right TKA.  PAIN:  Are you having pain? Yes: NPRS scale: 2.5/10 Pain location: Right lower thoracic. Pain description: As above. Aggravating factors: ADL performance Relieving factors: Heat and Lidocaine patches.  PRECAUTIONS: Other: OP.  WEIGHT BEARING RESTRICTIONS: No  FALLS:  Has patient fallen in last 6 months? No  LIVING ENVIRONMENT: Lives in: House/apartment Has following equipment at home:  None  OCCUPATION: Disabled.  PLOF: Independent with basic ADLs  PATIENT GOALS: Be out of pain.  NEXT MD VISIT:   OBJECTIVE:   PATIENT SURVEYS:  FOTO    POSTURE: rounded shoulders and forward head  PALPATION: Very  tender to palpation right of T10 to T12.   ROM:  Bilateral U and LE ROM is WF/NL.  LOWER EXTREMITY MMT:   Patient able to perform normal strength values via MMTing to her bilateral U and LE's.  GAIT: Slow and purposeful gait without assistive device.  TODAY'S TREATMENT:                                                                                                                              DATE:                                   11/20 EXERCISE LOG  Exercise Repetitions and Resistance Comments  Nustep  L5 x 15 minutes   Scapular retraction 3 minutes   Isometric ball press 3 minutes w/ 5 second hold   Ball roll out  3 minutes        Blank cell = exercise not performed today   ASSESSMENT:  CLINICAL IMPRESSION: Patient was introduced to multiple new active interventions for latissimus dorsi engagement.  She required minimal cueing with today's new interventions for proper exercise performance to facilitate pain free mobility and strengthening. She reported no increase in pain or discomfort with any of today's interventions. She reported that her back felt better upon the conclusion of treatment. She continues to require skilled physical therapy to address her remaining impairments to return to her prior level of function.   OBJECTIVE IMPAIRMENTS: decreased activity tolerance, postural dysfunction, and pain.   ACTIVITY LIMITATIONS: carrying and lifting  PARTICIPATION LIMITATIONS: meal prep, cleaning, and laundry  REHAB POTENTIAL: Excellent  CLINICAL DECISION MAKING: Stable/uncomplicated  EVALUATION COMPLEXITY: Low   GOALS:  LONG TERM GOALS: Target date: 12/16/2022  Ind with a HEP. Baseline:  Goal status: INITIAL  2.  Perform ADL's with pain  not > 2-3/10. Baseline:  Goal status: INITIAL  PLAN:  PT FREQUENCY: 2x/week  PT DURATION: 3 weeks  PLANNED INTERVENTIONS: Therapeutic exercises, Therapeutic activity, Patient/Family education, Self Care, Electrical stimulation, Cryotherapy, Moist heat, Ultrasound, and Manual therapy.  PLAN FOR NEXT SESSION: Combo e'stim/US, STW/M, core exercise progression, body mechanics.   Darlin Coco, PT 11/09/2022, 2:54 PM

## 2022-11-11 ENCOUNTER — Emergency Department (HOSPITAL_COMMUNITY): Payer: Medicare Other

## 2022-11-11 ENCOUNTER — Encounter (HOSPITAL_COMMUNITY): Payer: Self-pay

## 2022-11-11 ENCOUNTER — Other Ambulatory Visit: Payer: Self-pay

## 2022-11-11 ENCOUNTER — Inpatient Hospital Stay (HOSPITAL_COMMUNITY)
Admission: EM | Admit: 2022-11-11 | Discharge: 2022-11-18 | DRG: 551 | Disposition: A | Discharge status: Rehabilitation Facility | Payer: Medicare Other | Attending: Surgery | Admitting: Surgery

## 2022-11-11 ENCOUNTER — Other Ambulatory Visit (HOSPITAL_COMMUNITY): Payer: Self-pay

## 2022-11-11 DIAGNOSIS — D62 Acute posthemorrhagic anemia: Secondary | ICD-10-CM | POA: Diagnosis not present

## 2022-11-11 DIAGNOSIS — Z944 Liver transplant status: Secondary | ICD-10-CM

## 2022-11-11 DIAGNOSIS — S36039A Unspecified laceration of spleen, initial encounter: Secondary | ICD-10-CM | POA: Diagnosis present

## 2022-11-11 DIAGNOSIS — E559 Vitamin D deficiency, unspecified: Secondary | ICD-10-CM | POA: Diagnosis present

## 2022-11-11 DIAGNOSIS — Z807 Family history of other malignant neoplasms of lymphoid, hematopoietic and related tissues: Secondary | ICD-10-CM

## 2022-11-11 DIAGNOSIS — Z803 Family history of malignant neoplasm of breast: Secondary | ICD-10-CM | POA: Diagnosis not present

## 2022-11-11 DIAGNOSIS — S22039A Unspecified fracture of third thoracic vertebra, initial encounter for closed fracture: Secondary | ICD-10-CM | POA: Diagnosis not present

## 2022-11-11 DIAGNOSIS — I10 Essential (primary) hypertension: Secondary | ICD-10-CM | POA: Diagnosis not present

## 2022-11-11 DIAGNOSIS — Z8261 Family history of arthritis: Secondary | ICD-10-CM

## 2022-11-11 DIAGNOSIS — K661 Hemoperitoneum: Secondary | ICD-10-CM | POA: Diagnosis not present

## 2022-11-11 DIAGNOSIS — S36031A Moderate laceration of spleen, initial encounter: Secondary | ICD-10-CM | POA: Diagnosis not present

## 2022-11-11 DIAGNOSIS — K589 Irritable bowel syndrome without diarrhea: Secondary | ICD-10-CM | POA: Diagnosis not present

## 2022-11-11 DIAGNOSIS — Z87442 Personal history of urinary calculi: Secondary | ICD-10-CM

## 2022-11-11 DIAGNOSIS — Z7952 Long term (current) use of systemic steroids: Secondary | ICD-10-CM | POA: Diagnosis not present

## 2022-11-11 DIAGNOSIS — T07XXXA Unspecified multiple injuries, initial encounter: Secondary | ICD-10-CM | POA: Diagnosis not present

## 2022-11-11 DIAGNOSIS — Z79899 Other long term (current) drug therapy: Secondary | ICD-10-CM | POA: Diagnosis not present

## 2022-11-11 DIAGNOSIS — I2510 Atherosclerotic heart disease of native coronary artery without angina pectoris: Secondary | ICD-10-CM | POA: Diagnosis present

## 2022-11-11 DIAGNOSIS — R52 Pain, unspecified: Secondary | ICD-10-CM | POA: Diagnosis not present

## 2022-11-11 DIAGNOSIS — K580 Irritable bowel syndrome with diarrhea: Secondary | ICD-10-CM | POA: Diagnosis present

## 2022-11-11 DIAGNOSIS — Z833 Family history of diabetes mellitus: Secondary | ICD-10-CM | POA: Diagnosis not present

## 2022-11-11 DIAGNOSIS — Z8379 Family history of other diseases of the digestive system: Secondary | ICD-10-CM

## 2022-11-11 DIAGNOSIS — Y92410 Unspecified street and highway as the place of occurrence of the external cause: Secondary | ICD-10-CM | POA: Diagnosis not present

## 2022-11-11 DIAGNOSIS — S2232XD Fracture of one rib, left side, subsequent encounter for fracture with routine healing: Secondary | ICD-10-CM | POA: Diagnosis not present

## 2022-11-11 DIAGNOSIS — M170 Bilateral primary osteoarthritis of knee: Secondary | ICD-10-CM | POA: Diagnosis present

## 2022-11-11 DIAGNOSIS — Z888 Allergy status to other drugs, medicaments and biological substances status: Secondary | ICD-10-CM

## 2022-11-11 DIAGNOSIS — Z8249 Family history of ischemic heart disease and other diseases of the circulatory system: Secondary | ICD-10-CM

## 2022-11-11 DIAGNOSIS — M810 Age-related osteoporosis without current pathological fracture: Secondary | ICD-10-CM | POA: Diagnosis not present

## 2022-11-11 DIAGNOSIS — S2242XA Multiple fractures of ribs, left side, initial encounter for closed fracture: Secondary | ICD-10-CM | POA: Diagnosis not present

## 2022-11-11 DIAGNOSIS — E785 Hyperlipidemia, unspecified: Secondary | ICD-10-CM | POA: Diagnosis present

## 2022-11-11 DIAGNOSIS — S36031D Moderate laceration of spleen, subsequent encounter: Secondary | ICD-10-CM | POA: Diagnosis not present

## 2022-11-11 DIAGNOSIS — I4719 Other supraventricular tachycardia: Secondary | ICD-10-CM | POA: Diagnosis present

## 2022-11-11 DIAGNOSIS — Z7982 Long term (current) use of aspirin: Secondary | ICD-10-CM | POA: Diagnosis not present

## 2022-11-11 DIAGNOSIS — M1710 Unilateral primary osteoarthritis, unspecified knee: Secondary | ICD-10-CM | POA: Diagnosis not present

## 2022-11-11 DIAGNOSIS — S22030S Wedge compression fracture of third thoracic vertebra, sequela: Secondary | ICD-10-CM | POA: Diagnosis not present

## 2022-11-11 DIAGNOSIS — S22030D Wedge compression fracture of third thoracic vertebra, subsequent encounter for fracture with routine healing: Secondary | ICD-10-CM | POA: Diagnosis present

## 2022-11-11 DIAGNOSIS — K219 Gastro-esophageal reflux disease without esophagitis: Secondary | ICD-10-CM | POA: Diagnosis present

## 2022-11-11 DIAGNOSIS — S2249XA Multiple fractures of ribs, unspecified side, initial encounter for closed fracture: Secondary | ICD-10-CM

## 2022-11-11 LAB — TYPE AND SCREEN
ABO/RH(D): B POS
Antibody Screen: NEGATIVE

## 2022-11-11 LAB — COMPREHENSIVE METABOLIC PANEL
ALT: 28 U/L (ref 0–44)
AST: 34 U/L (ref 15–41)
Albumin: 4.4 g/dL (ref 3.5–5.0)
Alkaline Phosphatase: 61 U/L (ref 38–126)
Anion gap: 8 (ref 5–15)
BUN: 30 mg/dL — ABNORMAL HIGH (ref 8–23)
CO2: 25 mmol/L (ref 22–32)
Calcium: 9.2 mg/dL (ref 8.9–10.3)
Chloride: 108 mmol/L (ref 98–111)
Creatinine, Ser: 0.95 mg/dL (ref 0.44–1.00)
GFR, Estimated: >60 mL/min (ref >60)
Glucose, Bld: 112 mg/dL — ABNORMAL HIGH (ref 70–99)
Potassium: 4.3 mmol/L (ref 3.5–5.1)
Sodium: 141 mmol/L (ref 135–145)
Total Bilirubin: 0.5 mg/dL (ref 0.3–1.2)
Total Protein: 7.4 g/dL (ref 6.5–8.1)

## 2022-11-11 LAB — CBC
HCT: 35.5 % — ABNORMAL LOW (ref 36.0–46.0)
HCT: 30.7 % — ABNORMAL LOW (ref 36.0–46.0)
Hemoglobin: 10.4 g/dL — ABNORMAL LOW (ref 12.0–15.0)
Hemoglobin: 8.8 g/dL — ABNORMAL LOW (ref 12.0–15.0)
MCH: 23.7 pg — ABNORMAL LOW (ref 26.0–34.0)
MCH: 23.2 pg — ABNORMAL LOW (ref 26.0–34.0)
MCHC: 29.3 g/dL — ABNORMAL LOW (ref 30.0–36.0)
MCHC: 28.7 g/dL — ABNORMAL LOW (ref 30.0–36.0)
MCV: 80.9 fL (ref 80.0–100.0)
MCV: 80.8 fL (ref 80.0–100.0)
Platelets: 287 10*3/uL (ref 150–400)
Platelets: 275 10*3/uL (ref 150–400)
RBC: 4.39 MIL/uL (ref 3.87–5.11)
RBC: 3.8 MIL/uL — ABNORMAL LOW (ref 3.87–5.11)
RDW: 14.9 % (ref 11.5–15.5)
RDW: 14.9 % (ref 11.5–15.5)
WBC: 5.3 10*3/uL (ref 4.0–10.5)
WBC: 4.5 10*3/uL (ref 4.0–10.5)
nRBC: 0 % (ref 0.0–0.2)
nRBC: 0 % (ref 0.0–0.2)

## 2022-11-11 LAB — PROTIME-INR
INR: 1.1 (ref 0.8–1.2)
Prothrombin Time: 13.9 seconds (ref 11.4–15.2)

## 2022-11-11 LAB — TROPONIN I (HIGH SENSITIVITY)
Troponin I (High Sensitivity): 3 ng/L (ref <18)
Troponin I (High Sensitivity): 3 ng/L (ref <18)

## 2022-11-11 MED ORDER — FENTANYL CITRATE PF 50 MCG/ML IJ SOSY: 50.0000 ug | PREFILLED_SYRINGE | Freq: Once | INTRAMUSCULAR | Status: DC

## 2022-11-11 MED ORDER — FENTANYL CITRATE PF 50 MCG/ML IJ SOSY
50.0000 ug | PREFILLED_SYRINGE | Freq: Once | INTRAMUSCULAR | Status: AC
  Administered 2022-11-11: 50 ug via INTRAVENOUS
  Filled 2022-11-11: qty 1

## 2022-11-11 MED ORDER — METHOCARBAMOL 500 MG PO TABS
500.0000 mg | ORAL_TABLET | Freq: Three times a day (TID) | ORAL | Status: DC | PRN
  Administered 2022-11-12 – 2022-11-13 (×4): 500 mg via ORAL
  Filled 2022-11-11 (×5): qty 1

## 2022-11-11 MED ORDER — ONDANSETRON HCL 4 MG/2ML IJ SOLN
4.0000 mg | Freq: Once | INTRAMUSCULAR | Status: AC
  Administered 2022-11-11: 4 mg via INTRAVENOUS
  Filled 2022-11-11: qty 2

## 2022-11-11 MED ORDER — EZETIMIBE 10 MG PO TABS
10.0000 mg | ORAL_TABLET | Freq: Every day | ORAL | Status: DC
  Administered 2022-11-12 – 2022-11-17 (×6): 10 mg via ORAL
  Filled 2022-11-11 (×6): qty 1

## 2022-11-11 MED ORDER — METHOCARBAMOL 1000 MG/10ML IJ SOLN: 500.0000 mg | Freq: Three times a day (TID) | INTRAVENOUS | Status: DC | PRN

## 2022-11-11 MED ORDER — OYSTER SHELL CALCIUM/D3 500-5 MG-MCG PO TABS
2.0000 | ORAL_TABLET | Freq: Two times a day (BID) | ORAL | Status: DC
  Administered 2022-11-12 – 2022-11-18 (×13): 2 via ORAL
  Filled 2022-11-11 (×14): qty 2

## 2022-11-11 MED ORDER — MYCOPHENOLATE MOFETIL 250 MG PO CAPS
1000.0000 mg | ORAL_CAPSULE | Freq: Two times a day (BID) | ORAL | Status: DC
  Administered 2022-11-11 – 2022-11-18 (×14): 1000 mg via ORAL
  Filled 2022-11-11 (×16): qty 4

## 2022-11-11 MED ORDER — MELATONIN 3 MG PO TABS
6.0000 mg | ORAL_TABLET | Freq: Every evening | ORAL | Status: DC | PRN
  Administered 2022-11-15 – 2022-11-17 (×3): 6 mg via ORAL
  Filled 2022-11-11 (×3): qty 2

## 2022-11-11 MED ORDER — HYDROMORPHONE HCL 1 MG/ML IJ SOLN
0.5000 mg | INTRAMUSCULAR | Status: DC | PRN
  Administered 2022-11-12 – 2022-11-15 (×14): 0.5 mg via INTRAVENOUS
  Filled 2022-11-11 (×3): qty 1
  Filled 2022-11-11: qty 0.5
  Filled 2022-11-11 (×3): qty 1
  Filled 2022-11-11: qty 0.5
  Filled 2022-11-11 (×4): qty 1
  Filled 2022-11-11: qty 0.5
  Filled 2022-11-11: qty 1

## 2022-11-11 MED ORDER — ONDANSETRON HCL 4 MG/2ML IJ SOLN
4.0000 mg | Freq: Four times a day (QID) | INTRAMUSCULAR | Status: DC | PRN
  Administered 2022-11-12 – 2022-11-18 (×7): 4 mg via INTRAVENOUS
  Filled 2022-11-11 (×7): qty 2

## 2022-11-11 MED ORDER — LACTATED RINGERS IV SOLN: INTRAVENOUS | Status: DC

## 2022-11-11 MED ORDER — DOCUSATE SODIUM 100 MG PO CAPS
100.0000 mg | ORAL_CAPSULE | Freq: Two times a day (BID) | ORAL | Status: DC
  Administered 2022-11-14 – 2022-11-18 (×9): 100 mg via ORAL
  Filled 2022-11-11 (×10): qty 1

## 2022-11-11 MED ORDER — TACROLIMUS 1 MG PO CAPS
3.0000 mg | ORAL_CAPSULE | Freq: Two times a day (BID) | ORAL | Status: DC
  Administered 2022-11-11 – 2022-11-18 (×14): 3 mg via ORAL
  Filled 2022-11-11 (×16): qty 3

## 2022-11-11 MED ORDER — PANTOPRAZOLE SODIUM 40 MG PO TBEC
40.0000 mg | DELAYED_RELEASE_TABLET | Freq: Every day | ORAL | Status: DC
  Administered 2022-11-12 – 2022-11-18 (×7): 40 mg via ORAL
  Filled 2022-11-11 (×7): qty 1

## 2022-11-11 MED ORDER — ONDANSETRON 4 MG PO TBDP: 4.0000 mg | ORAL_TABLET | Freq: Four times a day (QID) | ORAL | Status: DC | PRN

## 2022-11-11 MED ORDER — ACETAMINOPHEN 500 MG PO TABS
500.0000 mg | ORAL_TABLET | Freq: Four times a day (QID) | ORAL | Status: DC | PRN
  Administered 2022-11-12 – 2022-11-18 (×9): 500 mg via ORAL
  Filled 2022-11-11 (×9): qty 1

## 2022-11-11 MED ORDER — PREDNISONE 5 MG PO TABS
5.0000 mg | ORAL_TABLET | Freq: Every day | ORAL | Status: DC
  Administered 2022-11-12 – 2022-11-18 (×7): 5 mg via ORAL
  Filled 2022-11-11 (×8): qty 1

## 2022-11-11 MED ORDER — IOHEXOL 300 MG/ML  SOLN
100.0000 mL | Freq: Once | INTRAMUSCULAR | Status: AC | PRN
  Administered 2022-11-11: 100 mL via INTRAVENOUS
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
## 2022-11-11 NOTE — ED Notes (Signed)
Pt to [facility] ED rm 24 via [facility] from [place].  Pt is A&Ox4, NAD noted. Pt c/o 5/10 pain in left shoulder and chest. Pt denies shortness of breath, nausea and/or vomiting.  Pt awaiting trauma/ED MD consult.  Family at bedside.

## 2022-11-11 NOTE — ED Notes (Signed)
[facility] notified for pt transfer
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
## 2022-11-12 LAB — CBC
HCT: 29.5 % — ABNORMAL LOW (ref 36.0–46.0)
Hemoglobin: 8.8 g/dL — ABNORMAL LOW (ref 12.0–15.0)
MCH: 23.7 pg — ABNORMAL LOW (ref 26.0–34.0)
MCHC: 29.8 g/dL — ABNORMAL LOW (ref 30.0–36.0)
MCV: 79.3 fL — ABNORMAL LOW (ref 80.0–100.0)
Platelets: 256 10*3/uL (ref 150–400)
RBC: 3.72 MIL/uL — ABNORMAL LOW (ref 3.87–5.11)
RDW: 14.9 % (ref 11.5–15.5)
WBC: 4.3 10*3/uL (ref 4.0–10.5)
nRBC: 0 % (ref 0.0–0.2)

## 2022-11-12 LAB — HIV ANTIBODY (ROUTINE TESTING W REFLEX): HIV Screen 4th Generation wRfx: NONREACTIVE

## 2022-11-12 LAB — URINALYSIS, ROUTINE W REFLEX MICROSCOPIC
Bilirubin Urine: NEGATIVE
Glucose, UA: NEGATIVE mg/dL
Hgb urine dipstick: NEGATIVE
Ketones, ur: NEGATIVE mg/dL
Leukocytes,Ua: NEGATIVE
Nitrite: NEGATIVE
Protein, ur: NEGATIVE mg/dL
Specific Gravity, Urine: >1.046 — ABNORMAL HIGH (ref 1.005–1.030)
pH: 5 (ref 5.0–8.0)

## 2022-11-12 LAB — COMPREHENSIVE METABOLIC PANEL
ALT: 24 U/L (ref 0–44)
AST: 32 U/L (ref 15–41)
Albumin: 3.6 g/dL (ref 3.5–5.0)
Alkaline Phosphatase: 57 U/L (ref 38–126)
Anion gap: 11 (ref 5–15)
BUN: 24 mg/dL — ABNORMAL HIGH (ref 8–23)
CO2: 22 mmol/L (ref 22–32)
Calcium: 8.5 mg/dL — ABNORMAL LOW (ref 8.9–10.3)
Chloride: 106 mmol/L (ref 98–111)
Creatinine, Ser: 0.91 mg/dL (ref 0.44–1.00)
GFR, Estimated: >60 mL/min (ref >60)
Glucose, Bld: 113 mg/dL — ABNORMAL HIGH (ref 70–99)
Potassium: 4.6 mmol/L (ref 3.5–5.1)
Sodium: 139 mmol/L (ref 135–145)
Total Bilirubin: 0.6 mg/dL (ref 0.3–1.2)
Total Protein: 5.9 g/dL — ABNORMAL LOW (ref 6.5–8.1)

## 2022-11-12 LAB — HEMOGLOBIN AND HEMATOCRIT, BLOOD
HCT: 27.9 % — ABNORMAL LOW (ref 36.0–46.0)
HCT: 27.3 % — ABNORMAL LOW (ref 36.0–46.0)
HCT: 24.7 % — ABNORMAL LOW (ref 36.0–46.0)
Hemoglobin: 8.4 g/dL — ABNORMAL LOW (ref 12.0–15.0)
Hemoglobin: 8 g/dL — ABNORMAL LOW (ref 12.0–15.0)
Hemoglobin: 7.4 g/dL — ABNORMAL LOW (ref 12.0–15.0)

## 2022-11-12 LAB — MAGNESIUM: Magnesium: 1.7 mg/dL (ref 1.7–2.4)

## 2022-11-12 LAB — MRSA NEXT GEN BY PCR, NASAL: MRSA by PCR Next Gen: NOT DETECTED

## 2022-11-12 MED ORDER — ORAL CARE MOUTH RINSE
15.0000 mL | OROMUCOSAL | Status: DC | PRN
  Administered 2022-11-14: 15 mL via OROMUCOSAL

## 2022-11-12 MED ORDER — CHLORHEXIDINE GLUCONATE CLOTH 2 % EX PADS
6.0000 | MEDICATED_PAD | Freq: Every day | CUTANEOUS | Status: DC
  Administered 2022-11-12 – 2022-11-14 (×2): 6 via TOPICAL

## 2022-11-12 MED ORDER — OXYCODONE HCL 5 MG PO TABS
5.0000 mg | ORAL_TABLET | ORAL | Status: DC | PRN
  Administered 2022-11-12 – 2022-11-18 (×28): 5 mg via ORAL
  Filled 2022-11-12 (×28): qty 1
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
## 2022-11-13 LAB — COMPREHENSIVE METABOLIC PANEL
ALT: 73 U/L — ABNORMAL HIGH (ref 0–44)
AST: 103 U/L — ABNORMAL HIGH (ref 15–41)
Albumin: 2.7 g/dL — ABNORMAL LOW (ref 3.5–5.0)
Alkaline Phosphatase: 71 U/L (ref 38–126)
Anion gap: 12 (ref 5–15)
BUN: 16 mg/dL (ref 8–23)
CO2: 24 mmol/L (ref 22–32)
Calcium: 8.3 mg/dL — ABNORMAL LOW (ref 8.9–10.3)
Chloride: 102 mmol/L (ref 98–111)
Creatinine, Ser: 0.86 mg/dL (ref 0.44–1.00)
GFR, Estimated: >60 mL/min (ref >60)
Glucose, Bld: 95 mg/dL (ref 70–99)
Potassium: 4.5 mmol/L (ref 3.5–5.1)
Sodium: 138 mmol/L (ref 135–145)
Total Bilirubin: 0.8 mg/dL (ref 0.3–1.2)
Total Protein: 4.9 g/dL — ABNORMAL LOW (ref 6.5–8.1)

## 2022-11-13 LAB — CBC
HCT: 23.2 % — ABNORMAL LOW (ref 36.0–46.0)
Hemoglobin: 7 g/dL — ABNORMAL LOW (ref 12.0–15.0)
MCH: 24 pg — ABNORMAL LOW (ref 26.0–34.0)
MCHC: 30.2 g/dL (ref 30.0–36.0)
MCV: 79.5 fL — ABNORMAL LOW (ref 80.0–100.0)
Platelets: 194 10*3/uL (ref 150–400)
RBC: 2.92 MIL/uL — ABNORMAL LOW (ref 3.87–5.11)
RDW: 14.8 % (ref 11.5–15.5)
WBC: 3.4 10*3/uL — ABNORMAL LOW (ref 4.0–10.5)
nRBC: 0 % (ref 0.0–0.2)

## 2022-11-13 LAB — PREPARE RBC (CROSSMATCH)

## 2022-11-13 LAB — MAGNESIUM: Magnesium: 1.5 mg/dL — ABNORMAL LOW (ref 1.7–2.4)

## 2022-11-13 LAB — HEMOGLOBIN AND HEMATOCRIT, BLOOD
HCT: 21.7 % — ABNORMAL LOW (ref 36.0–46.0)
Hemoglobin: 6.6 g/dL — CL (ref 12.0–15.0)

## 2022-11-13 MED ORDER — SODIUM CHLORIDE 0.9% IV SOLUTION: Freq: Once | INTRAVENOUS | Status: AC

## 2022-11-13 MED ORDER — METHOCARBAMOL 750 MG PO TABS
750.0000 mg | ORAL_TABLET | Freq: Three times a day (TID) | ORAL | Status: DC | PRN
  Administered 2022-11-13 – 2022-11-16 (×8): 750 mg via ORAL
  Filled 2022-11-13: qty 1
  Filled 2022-11-13: qty 2
  Filled 2022-11-13 (×2): qty 1
  Filled 2022-11-13 (×2): qty 2
  Filled 2022-11-13: qty 1
  Filled 2022-11-13: qty 2

## 2022-11-13 MED ORDER — METHOCARBAMOL 1000 MG/10ML IJ SOLN: 500.0000 mg | Freq: Three times a day (TID) | INTRAVENOUS | Status: DC | PRN

## 2022-11-13 NOTE — TOC CAGE-AID Note (Signed)
Transition of Care [facility]) - CAGE-AID Screening   Patient Details  Name: Dawn Moon MRN: [id number] Date of Birth: February 29, 1956  [facility] CM/SW Contact:    Army Melia, RN Phone Number:[phone] 11/13/2022, 4:54 AM   Clinical Narrative:  No hx drug/alcohol use  CAGE-AID Screening:    Have You Ever Felt You Ought to Cut Down on Your Drinking or Drug Use?: No Have People Annoyed You By Critizing Your Drinking Or Drug Use?: No Have You Felt Bad Or Guilty About Your Drinking Or Drug Use?: No Have You Ever Had a Drink or Used Drugs First Thing In The Morning to Steady Your Nerves or to Get Rid of a Hangover?: No CAGE-AID Score: 0  Substance Abuse Education Offered: No
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
## 2022-11-14 ENCOUNTER — Inpatient Hospital Stay (HOSPITAL_COMMUNITY): Payer: Medicare Other

## 2022-11-14 LAB — CBC WITH DIFFERENTIAL/PLATELET
Abs Immature Granulocytes: 0.2 10*3/uL — ABNORMAL HIGH (ref 0.00–0.07)
Basophils Absolute: 0.1 10*3/uL (ref 0.0–0.1)
Basophils Relative: 2 %
Eosinophils Absolute: 0.1 10*3/uL (ref 0.0–0.5)
Eosinophils Relative: 2 %
HCT: 26.5 % — ABNORMAL LOW (ref 36.0–46.0)
Hemoglobin: 8.1 g/dL — ABNORMAL LOW (ref 12.0–15.0)
Lymphocytes Relative: 7 %
Lymphs Abs: 0.3 10*3/uL — ABNORMAL LOW (ref 0.7–4.0)
MCH: 24.8 pg — ABNORMAL LOW (ref 26.0–34.0)
MCHC: 30.6 g/dL (ref 30.0–36.0)
MCV: 81 fL (ref 80.0–100.0)
Metamyelocytes Relative: 3 %
Monocytes Absolute: 0.4 10*3/uL (ref 0.1–1.0)
Monocytes Relative: 10 %
Neutro Abs: 2.9 10*3/uL (ref 1.7–7.7)
Neutrophils Relative %: 75 %
Platelets: 198 10*3/uL (ref 150–400)
Promyelocytes Relative: 1 %
RBC: 3.27 MIL/uL — ABNORMAL LOW (ref 3.87–5.11)
RDW: 15.2 % (ref 11.5–15.5)
WBC: 3.8 10*3/uL — ABNORMAL LOW (ref 4.0–10.5)
nRBC: 0 % (ref 0.0–0.2)
nRBC: 0 /100 WBC

## 2022-11-14 LAB — TYPE AND SCREEN
ABO/RH(D): B POS
Antibody Screen: NEGATIVE
Unit division: 0

## 2022-11-14 LAB — COMPREHENSIVE METABOLIC PANEL
ALT: 67 U/L — ABNORMAL HIGH (ref 0–44)
AST: 45 U/L — ABNORMAL HIGH (ref 15–41)
Albumin: 2.7 g/dL — ABNORMAL LOW (ref 3.5–5.0)
Alkaline Phosphatase: 96 U/L (ref 38–126)
Anion gap: 9 (ref 5–15)
BUN: 12 mg/dL (ref 8–23)
CO2: 26 mmol/L (ref 22–32)
Calcium: 8.4 mg/dL — ABNORMAL LOW (ref 8.9–10.3)
Chloride: 101 mmol/L (ref 98–111)
Creatinine, Ser: 0.84 mg/dL (ref 0.44–1.00)
GFR, Estimated: >60 mL/min (ref >60)
Glucose, Bld: 120 mg/dL — ABNORMAL HIGH (ref 70–99)
Potassium: 4.4 mmol/L (ref 3.5–5.1)
Sodium: 136 mmol/L (ref 135–145)
Total Bilirubin: 1 mg/dL (ref 0.3–1.2)
Total Protein: 5.4 g/dL — ABNORMAL LOW (ref 6.5–8.1)

## 2022-11-14 LAB — BPAM RBC
Blood Product Expiration Date: 202312162359
ISSUE DATE / TIME: 202311241525
Unit Type and Rh: 7300

## 2022-11-14 LAB — MAGNESIUM: Magnesium: 1.6 mg/dL — ABNORMAL LOW (ref 1.7–2.4)

## 2022-11-14 MED ORDER — MAGNESIUM SULFATE 4 GM/100ML IV SOLN
4.0000 g | Freq: Once | INTRAVENOUS | Status: AC
  Administered 2022-11-14: 4 g via INTRAVENOUS
  Filled 2022-11-14: qty 100
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
## 2022-11-14 NOTE — Progress Notes (Signed)
Rn in pt room - EKG alarm and pt had 9 beat run of SVT.  Pt stated she felt her heart flutter during the episode.  Pt stated she has had similar episodes in the past, but not recently.  CCMD to save strip.  Will continue to monitor.

## 2022-11-14 NOTE — Plan of Care (Signed)
  Problem: Education: Goal: Knowledge of General Education information will improve Description: Including pain rating scale, medication(s)/side effects and non-pharmacologic comfort measures Outcome: Progressing   Problem: Health Behavior/Discharge Planning: Goal: Ability to manage health-related needs will improve Outcome: Progressing   Problem: Clinical Measurements: Goal: Ability to maintain clinical measurements within normal limits will improve Outcome: Progressing Goal: Will remain free from infection Outcome: Progressing Goal: Respiratory complications will improve Outcome: Progressing Goal: Cardiovascular complication will be avoided Outcome: Progressing   Problem: Nutrition: Goal: Adequate nutrition will be maintained Outcome: Progressing   Problem: Coping: Goal: Level of anxiety will decrease Outcome: Progressing   Problem: Elimination: Goal: Will not experience complications related to urinary retention Outcome: Progressing   Problem: Safety: Goal: Ability to remain free from injury will improve Outcome: Progressing   Problem: Skin Integrity: Goal: Risk for impaired skin integrity will decrease Outcome: Progressing   Problem: Activity: Goal: Ability to perform activities at highest level will improve Outcome: Progressing Goal: Ability to avoid complications of mobility impairment will improve Outcome: Progressing Goal: Ability to tolerate increased activity will improve Outcome: Progressing Goal: Will remain free from falls Outcome: Progressing   Problem: Respiratory: Goal: Ability to maintain adequate ventilation will improve Outcome: Progressing   Problem: Tissue Perfusion: Goal: Hemodynamically stable with effective tissue perfusion will improve Outcome: Progressing   Problem: Skin Integrity: Goal: Ability to maintain adequate tissue integrity will improve Outcome: Progressing   Problem: Infection: Goal: Risk for infection will decrease  (spleen) Outcome: Progressing   Problem: Bowel/Gastric: Goal: GI tract motility and GI tissue perfusion will improve Outcome: Progressing Goal: Ability to demonstrate the techniques of an individualized bowel program will improve Outcome: Progressing   Problem: Urinary Elimination: Goal: Ability to achieve a regular elimination pattern will improve Outcome: Progressing   Problem: Fluid Volume: Goal: Return to normovolemic state will improve Outcome: Progressing   Problem: Metabolic: Goal: Ability to maintain a metabolic balance will improve Outcome: Progressing   Problem: Education: Goal: Knowledge of the prescribed therapeutic regimen will improve Outcome: Progressing Goal: Knowledge of injury and care (of blunt abdominal trauma) will improve Outcome: Progressing   Problem: Coping: Goal: Exhibits appropriate coping mechanism and reduced anxiety resulting from physical and emotional stress Outcome: Progressing   Problem: Self-Concept: Goal: Ability to acknowledge body changes will improve Outcome: Progressing   Problem: Clinical Measurements: Goal: Diagnostic test results will improve Outcome: Not Progressing   Problem: Activity: Goal: Risk for activity intolerance will decrease Outcome: Not Progressing   Problem: Elimination: Goal: Will not experience complications related to bowel motility Outcome: Not Progressing   Problem: Pain Managment: Goal: General experience of comfort will improve Outcome: Not Progressing   Problem: Tissue Perfusion: Goal: Postoperative complications will be avoided or minimized Outcome: Not Applicable   Problem: Bowel/Gastric: Goal: Gastrointestinal status for postoperative course will improve Outcome: Not Applicable
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
## 2022-11-15 LAB — CBC WITH DIFFERENTIAL/PLATELET
Abs Immature Granulocytes: 0 10*3/uL (ref 0.00–0.07)
Basophils Absolute: 0 10*3/uL (ref 0.0–0.1)
Basophils Relative: 0 %
Eosinophils Absolute: 0.1 10*3/uL (ref 0.0–0.5)
Eosinophils Relative: 2 %
HCT: 28.8 % — ABNORMAL LOW (ref 36.0–46.0)
Hemoglobin: 8.6 g/dL — ABNORMAL LOW (ref 12.0–15.0)
Lymphocytes Relative: 4 %
Lymphs Abs: 0.2 10*3/uL — ABNORMAL LOW (ref 0.7–4.0)
MCH: 24.8 pg — ABNORMAL LOW (ref 26.0–34.0)
MCHC: 29.9 g/dL — ABNORMAL LOW (ref 30.0–36.0)
MCV: 83 fL (ref 80.0–100.0)
Monocytes Absolute: 0.5 10*3/uL (ref 0.1–1.0)
Monocytes Relative: 12 %
Neutro Abs: 3.4 10*3/uL (ref 1.7–7.7)
Neutrophils Relative %: 82 %
Platelets: 259 10*3/uL (ref 150–400)
RBC: 3.47 MIL/uL — ABNORMAL LOW (ref 3.87–5.11)
RDW: 15.6 % — ABNORMAL HIGH (ref 11.5–15.5)
WBC: 4.2 10*3/uL (ref 4.0–10.5)
nRBC: 0 % (ref 0.0–0.2)
nRBC: 0 /100 WBC

## 2022-11-15 LAB — MAGNESIUM: Magnesium: 2.3 mg/dL (ref 1.7–2.4)

## 2022-11-15 NOTE — Progress Notes (Signed)
Inpatient Rehab Admissions Coordinator:  ? ?Per therapy recommendations,  patient was screened for CIR candidacy by Sha Burling, MS, CCC-SLP. At this time, Pt. Appears to be a a potential candidate for CIR. I will place   order for rehab consult per protocol for full assessment. Please contact me any with questions. ? ?Jiyan Walkowski, MS, CCC-SLP ?Rehab Admissions Coordinator  ?[phone] (celll) ?[phone] (office) ? ?

## 2022-11-15 NOTE — Progress Notes (Signed)
Pt had run of SVT - strip saved by CCMD.  Pt in NSR.
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
## 2022-11-16 ENCOUNTER — Ambulatory Visit: Payer: Medicare Other | Admitting: Physical Therapy

## 2022-11-16 LAB — CBC
HCT: 26.8 % — ABNORMAL LOW (ref 36.0–46.0)
Hemoglobin: 8.1 g/dL — ABNORMAL LOW (ref 12.0–15.0)
MCH: 24.8 pg — ABNORMAL LOW (ref 26.0–34.0)
MCHC: 30.2 g/dL (ref 30.0–36.0)
MCV: 82.2 fL (ref 80.0–100.0)
Platelets: 267 10*3/uL (ref 150–400)
RBC: 3.26 MIL/uL — ABNORMAL LOW (ref 3.87–5.11)
RDW: 15.9 % — ABNORMAL HIGH (ref 11.5–15.5)
WBC: 3.2 10*3/uL — ABNORMAL LOW (ref 4.0–10.5)
nRBC: 0 % (ref 0.0–0.2)

## 2022-11-16 MED ORDER — METHOCARBAMOL 500 MG PO TABS
1000.0000 mg | ORAL_TABLET | Freq: Three times a day (TID) | ORAL | Status: DC
  Administered 2022-11-16 – 2022-11-18 (×7): 1000 mg via ORAL
  Filled 2022-11-16 (×7): qty 2

## 2022-11-16 MED ORDER — ENOXAPARIN SODIUM 30 MG/0.3ML IJ SOSY
30.0000 mg | PREFILLED_SYRINGE | Freq: Two times a day (BID) | INTRAMUSCULAR | Status: DC
  Administered 2022-11-16 – 2022-11-18 (×4): 30 mg via SUBCUTANEOUS
  Filled 2022-11-16 (×4): qty 0.3

## 2022-11-16 MED ORDER — KETOROLAC TROMETHAMINE 15 MG/ML IJ SOLN
15.0000 mg | Freq: Four times a day (QID) | INTRAMUSCULAR | Status: DC
  Administered 2022-11-16 – 2022-11-18 (×9): 15 mg via INTRAVENOUS
  Filled 2022-11-16 (×9): qty 1

## 2022-11-16 MED ORDER — HYDROMORPHONE HCL 1 MG/ML IJ SOLN
0.5000 mg | INTRAMUSCULAR | Status: DC | PRN
  Administered 2022-11-16 – 2022-11-17 (×3): 0.5 mg via INTRAVENOUS
  Filled 2022-11-16 (×3): qty 0.5

## 2022-11-16 NOTE — Care Management Important Message (Signed)
Important Message  Patient Details  Name: Dawn Moon MRN: [id number] Date of Birth: 26-May-1956   Medicare Important Message Given:  Yes     Hannah Beat 11/16/2022, 1:08 PM
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
## 2022-11-16 NOTE — Progress Notes (Signed)
Pt weaned to 1L [place] SpO2 maintaining between 95-96%. Pt encouraged to use IS every 2 hours she verbalized understanding.
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
## 2022-11-16 NOTE — Progress Notes (Signed)
  Inpatient Rehabilitation Admissions Coordinator   Met with patient and spouse at bedside for rehab assessment. We discussed goals and expectations of a possible CIR admit. They prefer CIR for rehab. Family can provide expected caregiver support that is recommended . I await medical readiness to admit to CIR. Please call me with any questions.   Danne Baxter, RN, MSN Rehab Admissions Coordinator [phone]

## 2022-11-17 LAB — CBC
HCT: 25.5 % — ABNORMAL LOW (ref 36.0–46.0)
Hemoglobin: 7.6 g/dL — ABNORMAL LOW (ref 12.0–15.0)
MCH: 24.4 pg — ABNORMAL LOW (ref 26.0–34.0)
MCHC: 29.8 g/dL — ABNORMAL LOW (ref 30.0–36.0)
MCV: 82 fL (ref 80.0–100.0)
Platelets: 275 10*3/uL (ref 150–400)
RBC: 3.11 MIL/uL — ABNORMAL LOW (ref 3.87–5.11)
RDW: 15.9 % — ABNORMAL HIGH (ref 11.5–15.5)
WBC: 3.5 10*3/uL — ABNORMAL LOW (ref 4.0–10.5)
nRBC: 0 % (ref 0.0–0.2)

## 2022-11-17 MED ORDER — LIDOCAINE 5 % EX PTCH
1.0000 | MEDICATED_PATCH | CUTANEOUS | Status: DC
  Administered 2022-11-17 – 2022-11-18 (×2): 1 via TRANSDERMAL
  Filled 2022-11-17 (×2): qty 1
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
## 2022-11-18 ENCOUNTER — Inpatient Hospital Stay (HOSPITAL_COMMUNITY)
Admission: RE | Admit: 2022-11-18 | Discharge: 2022-11-25 | DRG: 560 | Disposition: A | Discharge status: Home Health | Payer: Medicare Other | Source: Intra-hospital | Attending: Physical Medicine & Rehabilitation | Admitting: Physical Medicine & Rehabilitation

## 2022-11-18 ENCOUNTER — Other Ambulatory Visit: Payer: Self-pay

## 2022-11-18 ENCOUNTER — Encounter (HOSPITAL_COMMUNITY): Payer: Self-pay | Admitting: Physical Medicine & Rehabilitation

## 2022-11-18 DIAGNOSIS — Z944 Liver transplant status: Secondary | ICD-10-CM

## 2022-11-18 DIAGNOSIS — I10 Essential (primary) hypertension: Secondary | ICD-10-CM | POA: Diagnosis present

## 2022-11-18 DIAGNOSIS — S2232XD Fracture of one rib, left side, subsequent encounter for fracture with routine healing: Secondary | ICD-10-CM | POA: Diagnosis not present

## 2022-11-18 DIAGNOSIS — T07XXXA Unspecified multiple injuries, initial encounter: Secondary | ICD-10-CM | POA: Diagnosis not present

## 2022-11-18 DIAGNOSIS — Z87442 Personal history of urinary calculi: Secondary | ICD-10-CM

## 2022-11-18 DIAGNOSIS — S22030D Wedge compression fracture of third thoracic vertebra, subsequent encounter for fracture with routine healing: Principal | ICD-10-CM

## 2022-11-18 DIAGNOSIS — Z8249 Family history of ischemic heart disease and other diseases of the circulatory system: Secondary | ICD-10-CM

## 2022-11-18 DIAGNOSIS — S36031D Moderate laceration of spleen, subsequent encounter: Secondary | ICD-10-CM | POA: Diagnosis not present

## 2022-11-18 DIAGNOSIS — Z79899 Other long term (current) drug therapy: Secondary | ICD-10-CM

## 2022-11-18 DIAGNOSIS — S22030S Wedge compression fracture of third thoracic vertebra, sequela: Secondary | ICD-10-CM | POA: Diagnosis not present

## 2022-11-18 DIAGNOSIS — E785 Hyperlipidemia, unspecified: Secondary | ICD-10-CM | POA: Diagnosis present

## 2022-11-18 DIAGNOSIS — Z8379 Family history of other diseases of the digestive system: Secondary | ICD-10-CM | POA: Diagnosis not present

## 2022-11-18 DIAGNOSIS — Z833 Family history of diabetes mellitus: Secondary | ICD-10-CM

## 2022-11-18 DIAGNOSIS — Z803 Family history of malignant neoplasm of breast: Secondary | ICD-10-CM | POA: Diagnosis not present

## 2022-11-18 DIAGNOSIS — I4719 Other supraventricular tachycardia: Secondary | ICD-10-CM | POA: Diagnosis present

## 2022-11-18 DIAGNOSIS — M810 Age-related osteoporosis without current pathological fracture: Secondary | ICD-10-CM | POA: Diagnosis present

## 2022-11-18 DIAGNOSIS — I2510 Atherosclerotic heart disease of native coronary artery without angina pectoris: Secondary | ICD-10-CM | POA: Diagnosis present

## 2022-11-18 DIAGNOSIS — Z7952 Long term (current) use of systemic steroids: Secondary | ICD-10-CM

## 2022-11-18 DIAGNOSIS — Z807 Family history of other malignant neoplasms of lymphoid, hematopoietic and related tissues: Secondary | ICD-10-CM | POA: Diagnosis not present

## 2022-11-18 DIAGNOSIS — K580 Irritable bowel syndrome with diarrhea: Secondary | ICD-10-CM | POA: Diagnosis present

## 2022-11-18 DIAGNOSIS — R52 Pain, unspecified: Secondary | ICD-10-CM | POA: Diagnosis not present

## 2022-11-18 DIAGNOSIS — E559 Vitamin D deficiency, unspecified: Secondary | ICD-10-CM | POA: Diagnosis present

## 2022-11-18 DIAGNOSIS — M1710 Unilateral primary osteoarthritis, unspecified knee: Secondary | ICD-10-CM | POA: Diagnosis not present

## 2022-11-18 DIAGNOSIS — M170 Bilateral primary osteoarthritis of knee: Secondary | ICD-10-CM | POA: Diagnosis present

## 2022-11-18 DIAGNOSIS — K219 Gastro-esophageal reflux disease without esophagitis: Secondary | ICD-10-CM | POA: Diagnosis present

## 2022-11-18 DIAGNOSIS — S22000A Wedge compression fracture of unspecified thoracic vertebra, initial encounter for closed fracture: Secondary | ICD-10-CM | POA: Diagnosis present

## 2022-11-18 DIAGNOSIS — Z888 Allergy status to other drugs, medicaments and biological substances status: Secondary | ICD-10-CM | POA: Diagnosis not present

## 2022-11-18 DIAGNOSIS — Z741 Need for assistance with personal care: Secondary | ICD-10-CM | POA: Diagnosis present

## 2022-11-18 DIAGNOSIS — Z8261 Family history of arthritis: Secondary | ICD-10-CM | POA: Diagnosis not present

## 2022-11-18 DIAGNOSIS — Z7982 Long term (current) use of aspirin: Secondary | ICD-10-CM

## 2022-11-18 LAB — BASIC METABOLIC PANEL
Anion gap: 12 (ref 5–15)
BUN: 21 mg/dL (ref 8–23)
CO2: 28 mmol/L (ref 22–32)
Calcium: 8.7 mg/dL — ABNORMAL LOW (ref 8.9–10.3)
Chloride: 98 mmol/L (ref 98–111)
Creatinine, Ser: 0.88 mg/dL (ref 0.44–1.00)
GFR, Estimated: >60 mL/min (ref >60)
Glucose, Bld: 102 mg/dL — ABNORMAL HIGH (ref 70–99)
Potassium: 4.3 mmol/L (ref 3.5–5.1)
Sodium: 138 mmol/L (ref 135–145)

## 2022-11-18 LAB — CBC
HCT: 25.4 % — ABNORMAL LOW (ref 36.0–46.0)
Hemoglobin: 7.7 g/dL — ABNORMAL LOW (ref 12.0–15.0)
MCH: 24.7 pg — ABNORMAL LOW (ref 26.0–34.0)
MCHC: 30.3 g/dL (ref 30.0–36.0)
MCV: 81.4 fL (ref 80.0–100.0)
Platelets: 276 10*3/uL (ref 150–400)
RBC: 3.12 MIL/uL — ABNORMAL LOW (ref 3.87–5.11)
RDW: 15.9 % — ABNORMAL HIGH (ref 11.5–15.5)
WBC: 3.2 10*3/uL — ABNORMAL LOW (ref 4.0–10.5)
nRBC: 0 % (ref 0.0–0.2)

## 2022-11-18 MED ORDER — TACROLIMUS 1 MG PO CAPS
3.0000 mg | ORAL_CAPSULE | Freq: Two times a day (BID) | ORAL | Status: DC
  Administered 2022-11-18 – 2022-11-19 (×2): 3 mg via ORAL
  Filled 2022-11-18 (×2): qty 3

## 2022-11-18 MED ORDER — LIDOCAINE 5 % EX PTCH
1.0000 | MEDICATED_PATCH | CUTANEOUS | Status: DC
  Filled 2022-11-18: qty 1

## 2022-11-18 MED ORDER — CHOLECALCIFEROL 10 MCG (400 UNIT) PO TABS
400.0000 [IU] | ORAL_TABLET | Freq: Every day | ORAL | Status: DC
  Administered 2022-11-19 – 2022-11-25 (×7): 400 [IU] via ORAL
  Filled 2022-11-18 (×7): qty 1

## 2022-11-18 MED ORDER — ACETAMINOPHEN 325 MG PO TABS
325.0000 mg | ORAL_TABLET | ORAL | Status: DC | PRN
  Administered 2022-11-18 – 2022-11-24 (×7): 650 mg via ORAL
  Filled 2022-11-18 (×7): qty 2

## 2022-11-18 MED ORDER — ONDANSETRON 4 MG PO TBDP
4.0000 mg | ORAL_TABLET | Freq: Four times a day (QID) | ORAL | Status: DC | PRN
  Filled 2022-11-18: qty 1

## 2022-11-18 MED ORDER — MYCOPHENOLATE MOFETIL 250 MG PO CAPS
1000.0000 mg | ORAL_CAPSULE | Freq: Two times a day (BID) | ORAL | Status: DC
  Administered 2022-11-18 – 2022-11-25 (×14): 1000 mg via ORAL
  Filled 2022-11-18 (×15): qty 4

## 2022-11-18 MED ORDER — OXYCODONE HCL 5 MG PO TABS
5.0000 mg | ORAL_TABLET | ORAL | Status: DC | PRN
  Administered 2022-11-18: 10 mg via ORAL
  Administered 2022-11-18: 5 mg via ORAL
  Administered 2022-11-19 (×2): 10 mg via ORAL
  Filled 2022-11-18 (×3): qty 2
  Filled 2022-11-18: qty 1

## 2022-11-18 MED ORDER — DOCUSATE SODIUM 100 MG PO CAPS
100.0000 mg | ORAL_CAPSULE | Freq: Two times a day (BID) | ORAL | Status: DC
  Administered 2022-11-18 – 2022-11-25 (×10): 100 mg via ORAL
  Filled 2022-11-18 (×13): qty 1

## 2022-11-18 MED ORDER — ENOXAPARIN SODIUM 30 MG/0.3ML IJ SOSY
30.0000 mg | PREFILLED_SYRINGE | Freq: Two times a day (BID) | INTRAMUSCULAR | Status: DC
  Administered 2022-11-18 – 2022-11-25 (×14): 30 mg via SUBCUTANEOUS
  Filled 2022-11-18 (×14): qty 0.3

## 2022-11-18 MED ORDER — PREDNISONE 5 MG PO TABS
5.0000 mg | ORAL_TABLET | Freq: Every day | ORAL | Status: DC
  Administered 2022-11-19 – 2022-11-25 (×7): 5 mg via ORAL
  Filled 2022-11-18 (×7): qty 1

## 2022-11-18 MED ORDER — ONDANSETRON HCL 4 MG/2ML IJ SOLN
4.0000 mg | Freq: Four times a day (QID) | INTRAMUSCULAR | Status: DC | PRN
  Administered 2022-11-19: 4 mg via INTRAVENOUS
  Filled 2022-11-18: qty 2

## 2022-11-18 MED ORDER — PANTOPRAZOLE SODIUM 40 MG PO TBEC
40.0000 mg | DELAYED_RELEASE_TABLET | Freq: Every day | ORAL | Status: DC
  Administered 2022-11-19 – 2022-11-25 (×7): 40 mg via ORAL
  Filled 2022-11-18 (×7): qty 1

## 2022-11-18 MED ORDER — OXYCODONE HCL 5 MG PO TABS
5.0000 mg | ORAL_TABLET | ORAL | Status: DC | PRN
  Administered 2022-11-18: 10 mg via ORAL
  Filled 2022-11-18: qty 2

## 2022-11-18 MED ORDER — MG PLUS PROTEIN 133 MG PO TABS
3.0000 | ORAL_TABLET | Freq: Two times a day (BID) | ORAL | Status: DC
  Filled 2022-11-18: qty 3

## 2022-11-18 MED ORDER — OYSTER SHELL CALCIUM/D3 500-5 MG-MCG PO TABS: 2.0000 | ORAL_TABLET | Freq: Two times a day (BID) | ORAL | Status: DC

## 2022-11-18 MED ORDER — EZETIMIBE 10 MG PO TABS
10.0000 mg | ORAL_TABLET | Freq: Every day | ORAL | Status: DC
  Administered 2022-11-18 – 2022-11-24 (×7): 10 mg via ORAL
  Filled 2022-11-18 (×7): qty 1

## 2022-11-18 MED ORDER — OYSTER SHELL CALCIUM/D3 500-5 MG-MCG PO TABS
2.0000 | ORAL_TABLET | Freq: Two times a day (BID) | ORAL | Status: DC
  Administered 2022-11-18 – 2022-11-24 (×13): 2 via ORAL
  Filled 2022-11-18 (×13): qty 2

## 2022-11-18 MED ORDER — METHOCARBAMOL 500 MG PO TABS
1000.0000 mg | ORAL_TABLET | Freq: Three times a day (TID) | ORAL | Status: DC
  Administered 2022-11-18 – 2022-11-25 (×20): 1000 mg via ORAL
  Filled 2022-11-18 (×20): qty 2

## 2022-11-18 MED ORDER — MG PLUS PROTEIN 133 MG PO TABS: 3.0000 | ORAL_TABLET | ORAL | Status: DC

## 2022-11-18 MED ORDER — ENOXAPARIN SODIUM 30 MG/0.3ML IJ SOSY: 30.0000 mg | PREFILLED_SYRINGE | Freq: Two times a day (BID) | INTRAMUSCULAR | Status: DC

## 2022-11-18 MED ORDER — HYDROMORPHONE HCL 1 MG/ML IJ SOLN: 0.5000 mg | INTRAMUSCULAR | Status: DC | PRN

## 2022-11-18 MED ORDER — ACETAMINOPHEN 500 MG PO TABS
500.0000 mg | ORAL_TABLET | Freq: Three times a day (TID) | ORAL | Status: DC
  Administered 2022-11-18: 500 mg via ORAL
  Filled 2022-11-18: qty 1

## 2022-11-18 MED ORDER — MELATONIN 3 MG PO TABS
6.0000 mg | ORAL_TABLET | Freq: Every evening | ORAL | Status: DC | PRN
  Administered 2022-11-18 – 2022-11-22 (×4): 6 mg via ORAL
  Filled 2022-11-18 (×4): qty 2

## 2022-11-18 MED FILL — Fentanyl Citrate Preservative Free (PF) Inj 100 MCG/2ML: INTRAMUSCULAR | Qty: 1 | Status: AC
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
## 2022-11-18 NOTE — Progress Notes (Addendum)
INPATIENT REHABILITATION ADMISSION NOTE   Arrival Method:wheelchair     Mental Orientation: alert  Assessment:done   Skin:done   IV'S:SL right forearm   Pain:2-3/10   Tubes and Drains:none   Safety Measures:done   Vital Signs:done   Height and Weight:done   Rehab Orientation:done   Family: Husband    Arvell Pulsifer RN-BC,BSN, WTA
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
## 2022-11-18 NOTE — Progress Notes (Signed)
Inpatient Rehabilitation Admission Medication Review by a Pharmacist  A complete drug regimen review was completed for this patient to identify any potential clinically significant medication issues.  High Risk Drug Classes Is patient taking? Indication by Medication  Antipsychotic No   Anticoagulant Yes Lovenox- VTE ppx  Antibiotic No   Opioid Yes OxyIR- acute pain  Antiplatelet No   Hypoglycemics/insulin No   Vasoactive Medication No   Chemotherapy No   Other Yes Zetia- HLD Melatonin- sleep Mg plus protein- supplementation per transplant team Robaxin- muscle spasms Protonix- GERD Mycophenolate, Prograf, prednisone- liver transplant meds     Type of Medication Issue Identified Description of Issue Recommendation(s)  Drug Interaction(s) (clinically significant)     Duplicate Therapy     Allergy     No Medication Administration End Date     Incorrect Dose     Additional Drug Therapy Needed     Significant med changes from prior encounter (inform family/care partners about these prior to discharge).    Other        Clinically significant medication issues were identified that warrant physician communication and completion of prescribed/recommended actions by midnight of the next day:  No   Time spent performing this drug regimen review (minutes):  30   Jocee Kissick BS, PharmD, BCPS Clinical Pharmacist 11/18/2022 1:58 PM  Contact: [phone] after 3 PM  "Be curious, not judgmental..." -Jamal Maes

## 2022-11-18 NOTE — Progress Notes (Signed)
Inpatient Rehabilitation Admissions Coordinator   CIR bed is available for her admit today. I met with patient at bedside and she is in agreement to admit today. I will alert acute team and TOC and make the arrangements.  Danne Baxter, RN, MSN Rehab Admissions Coordinator [phone] 11/18/2022 10:10 AM

## 2022-11-18 NOTE — Progress Notes (Signed)
Pt with d/c orders to CIR. Report called to [facility]. Will transfer when room is ready.

## 2022-11-18 NOTE — Progress Notes (Signed)
Orthopedic Tech Progress Note Patient Details:  Dawn Moon December 14, 1956 [id number]  Ortho Devices Type of Ortho Device: Thoracolumbar corset (TLSO) Ortho Device/Splint Location: BACK Ortho Device/Splint Interventions: Ordered, Adjustment   Post Interventions Patient Tolerated: Well Instructions Provided: Care of device  Janit Pagan 11/18/2022, 9:22 AM

## 2022-11-18 NOTE — Discharge Instructions (Addendum)
Inpatient Rehab Discharge Instructions  Dawn Moon Discharge date and time: No discharge date for patient encounter.   Activities/Precautions/ Functional Status: Activity: activity as tolerated Diet: regular diet Wound Care: Routine skin checks Functional status:  ___ No restrictions     ___ Walk up steps independently ___ 24/7 supervision/assistance   ___ Walk up steps with assistance ___ Intermittent supervision/assistance  ___ Bathe/dress independently ___ Walk with walker     _x__ Bathe/dress with assistance ___ Walk Independently    ___ Shower independently ___ Walk with assistance    ___ Shower with assistance ___ No alcohol     ___ Return to work/school ________  Special Instructions:  No driving Smoking or alcohol  FOLLOW UP [facility] AS DIRECTED  COMMUNITY REFERRALS UPON DISCHARGE:    Home Health:   PT  OT  RN                Agency: [facility]    Phone:[phone]    Medical Equipment/Items Ordered: HAS ALL NEEDED EQUIPMENT FROM PAST ADMISSIONS                                                 Agency/Supplier:NA    My questions have been answered and I understand these instructions. I will adhere to these goals and the provided educational materials after my discharge from the hospital.  Patient/Caregiver Signature _______________________________ Date __________  Clinician Signature _______________________________________ Date __________  Please bring this form and your medication list with you to all your follow-up doctor's appointments.
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
## 2022-11-19 DIAGNOSIS — T07XXXA Unspecified multiple injuries, initial encounter: Secondary | ICD-10-CM | POA: Diagnosis not present

## 2022-11-19 LAB — CBC WITH DIFFERENTIAL/PLATELET
Abs Immature Granulocytes: 0.22 10*3/uL — ABNORMAL HIGH (ref 0.00–0.07)
Basophils Absolute: 0 10*3/uL (ref 0.0–0.1)
Basophils Relative: 1 %
Eosinophils Absolute: 0.2 10*3/uL (ref 0.0–0.5)
Eosinophils Relative: 6 %
HCT: 26.8 % — ABNORMAL LOW (ref 36.0–46.0)
Hemoglobin: 8.2 g/dL — ABNORMAL LOW (ref 12.0–15.0)
Immature Granulocytes: 8 %
Lymphocytes Relative: 15 %
Lymphs Abs: 0.4 10*3/uL — ABNORMAL LOW (ref 0.7–4.0)
MCH: 24.8 pg — ABNORMAL LOW (ref 26.0–34.0)
MCHC: 30.6 g/dL (ref 30.0–36.0)
MCV: 81 fL (ref 80.0–100.0)
Monocytes Absolute: 0.5 10*3/uL (ref 0.1–1.0)
Monocytes Relative: 17 %
Neutro Abs: 1.4 10*3/uL — ABNORMAL LOW (ref 1.7–7.7)
Neutrophils Relative %: 53 %
Platelets: 303 10*3/uL (ref 150–400)
RBC: 3.31 MIL/uL — ABNORMAL LOW (ref 3.87–5.11)
RDW: 15.7 % — ABNORMAL HIGH (ref 11.5–15.5)
WBC: 2.7 10*3/uL — ABNORMAL LOW (ref 4.0–10.5)
nRBC: 0 % (ref 0.0–0.2)

## 2022-11-19 LAB — COMPREHENSIVE METABOLIC PANEL
ALT: 79 U/L — ABNORMAL HIGH (ref 0–44)
AST: 69 U/L — ABNORMAL HIGH (ref 15–41)
Albumin: 2.8 g/dL — ABNORMAL LOW (ref 3.5–5.0)
Alkaline Phosphatase: 236 U/L — ABNORMAL HIGH (ref 38–126)
Anion gap: 9 (ref 5–15)
BUN: 19 mg/dL (ref 8–23)
CO2: 28 mmol/L (ref 22–32)
Calcium: 9 mg/dL (ref 8.9–10.3)
Chloride: 101 mmol/L (ref 98–111)
Creatinine, Ser: 0.97 mg/dL (ref 0.44–1.00)
GFR, Estimated: >60 mL/min (ref >60)
Glucose, Bld: 116 mg/dL — ABNORMAL HIGH (ref 70–99)
Potassium: 4.1 mmol/L (ref 3.5–5.1)
Sodium: 138 mmol/L (ref 135–145)
Total Bilirubin: 0.7 mg/dL (ref 0.3–1.2)
Total Protein: 6 g/dL — ABNORMAL LOW (ref 6.5–8.1)

## 2022-11-19 LAB — TACROLIMUS LEVEL: Tacrolimus (FK506) - LabCorp: 3.3 ng/mL (ref 2.0–20.0)

## 2022-11-19 MED ORDER — MG PLUS PROTEIN 133 MG PO TABS
3.0000 | ORAL_TABLET | Freq: Two times a day (BID) | ORAL | Status: DC
  Administered 2022-11-19 – 2022-11-24 (×12): 3 via ORAL
  Filled 2022-11-19: qty 3

## 2022-11-19 MED ORDER — TACROLIMUS 1 MG PO CAPS
4.0000 mg | ORAL_CAPSULE | Freq: Two times a day (BID) | ORAL | Status: DC
  Administered 2022-11-19 – 2022-11-25 (×12): 4 mg via ORAL
  Filled 2022-11-19 (×12): qty 4

## 2022-11-19 MED ORDER — TACROLIMUS 1 MG PO CAPS
1.0000 mg | ORAL_CAPSULE | Freq: Once | ORAL | Status: AC
  Administered 2022-11-19: 1 mg via ORAL
  Filled 2022-11-19: qty 1

## 2022-11-19 MED ORDER — LIDOCAINE 5 % EX PTCH
3.0000 | MEDICATED_PATCH | CUTANEOUS | Status: DC
  Administered 2022-11-19 – 2022-11-24 (×6): 3 via TRANSDERMAL
  Filled 2022-11-19 (×7): qty 3

## 2022-11-19 MED ORDER — OXYCODONE HCL 5 MG PO TABS
10.0000 mg | ORAL_TABLET | ORAL | Status: DC | PRN
  Administered 2022-11-19 – 2022-11-20 (×4): 15 mg via ORAL
  Administered 2022-11-20 (×2): 10 mg via ORAL
  Administered 2022-11-21: 15 mg via ORAL
  Administered 2022-11-21: 10 mg via ORAL
  Administered 2022-11-21 (×2): 15 mg via ORAL
  Administered 2022-11-22 (×4): 10 mg via ORAL
  Administered 2022-11-23: 15 mg via ORAL
  Administered 2022-11-23 (×2): 10 mg via ORAL
  Administered 2022-11-23: 15 mg via ORAL
  Administered 2022-11-23 – 2022-11-25 (×7): 10 mg via ORAL
  Filled 2022-11-19 (×2): qty 2
  Filled 2022-11-19 (×2): qty 3
  Filled 2022-11-19 (×2): qty 2
  Filled 2022-11-19 (×2): qty 3
  Filled 2022-11-19: qty 2
  Filled 2022-11-19: qty 3
  Filled 2022-11-19: qty 2
  Filled 2022-11-19 (×2): qty 3
  Filled 2022-11-19: qty 2
  Filled 2022-11-19 (×2): qty 3
  Filled 2022-11-19: qty 2
  Filled 2022-11-19 (×2): qty 3
  Filled 2022-11-19: qty 2
  Filled 2022-11-19: qty 3
  Filled 2022-11-19 (×2): qty 2
  Filled 2022-11-19 (×2): qty 3
  Filled 2022-11-19: qty 2
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
## 2022-11-19 NOTE — Plan of Care (Signed)
  Problem: Sit to Stand Goal: LTG:  Patient will perform sit to stand with assistance level (PT) Description: LTG:  Patient will perform sit to stand with assistance level (PT) Flowsheets (Taken 11/19/2022 1221) LTG: PT will perform sit to stand in preparation for functional mobility with assistance level: Independent with assistive device   Problem: RH Bed Mobility Goal: LTG Patient will perform bed mobility with assist (PT) Description: LTG: Patient will perform bed mobility with assistance, with/without cues (PT). Flowsheets (Taken 11/19/2022 1221) LTG: Pt will perform bed mobility with assistance level of: Independent with assistive device    Problem: RH Bed to Chair Transfers Goal: LTG Patient will perform bed/chair transfers w/assist (PT) Description: LTG: Patient will perform bed to chair transfers with assistance (PT). Flowsheets (Taken 11/19/2022 1221) LTG: Pt will perform Bed to Chair Transfers with assistance level: Independent with assistive device    Problem: RH Car Transfers Goal: LTG Patient will perform car transfers with assist (PT) Description: LTG: Patient will perform car transfers with assistance (PT). Flowsheets (Taken 11/19/2022 1221) LTG: Pt will perform car transfers with assist:: Independent with assistive device    Problem: RH Ambulation Goal: LTG Patient will ambulate in home environment (PT) Description: LTG: Patient will ambulate in home environment, # of feet with assistance (PT). Flowsheets (Taken 11/19/2022 1221) LTG: Pt will ambulate in home environ  assist needed:: Independent with assistive device LTG: Ambulation distance in home environment: 50' Goal: LTG Patient will ambulate in community environment (PT) Description: LTG: Patient will ambulate in community environment, # of feet with assistance (PT). Flowsheets (Taken 11/19/2022 1221) LTG: Pt will ambulate in community environ  assist needed:: Independent with assistive device LTG: Ambulation  distance in community environment: 150'   Problem: RH Stairs Goal: LTG Patient will ambulate up and down stairs w/assist (PT) Description: LTG: Patient will ambulate up and down # of stairs with assistance (PT) Flowsheets (Taken 11/19/2022 1221) LTG: Pt will ambulate up/down stairs assist needed:: Independent with assistive device LTG: Pt will  ambulate up and down number of stairs: 4
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
## 2022-11-19 NOTE — Progress Notes (Signed)
MEDICATION RELATED CONSULT NOTE - INITIAL   Pharmacy Consult for Tacrolimus dosing Indication: :Liver transplant June 2023  Allergies  Allergen Reactions   Atorvastatin Other (See Comments)    Muscle pain and cramps   Rosuvastatin Other (See Comments)    Muscle pain and cramps    Patient Measurements: Height: 5' 3"  (160 cm) Weight: 73 kg (160 lb 15 oz) IBW/kg (Calculated) : 52.4  Vital Signs: Temp: 97.7 F (36.5 C) (11/30 0527) Temp Source: Oral (11/30 0527) BP: 176/91 (11/30 0527) Pulse Rate: 86 (11/30 0527) Intake/Output from previous day: 11/29 0701 - 11/30 0700 In: 236 [P.O.:236] Out: -  Intake/Output from this shift: Total I/O In: 118 [P.O.:118] Out: -   Labs: Recent Labs    11/17/22 0502 11/18/22 0543 11/19/22 0730  WBC 3.5* 3.2* 2.7*  HGB 7.6* 7.7* 8.2*  HCT 25.5* 25.4* 26.8*  PLT 275 276 303  CREATININE  --  0.88 0.97  ALBUMIN  --   --  2.8*  PROT  --   --  6.0*  AST  --   --  69*  ALT  --   --  79*  ALKPHOS  --   --  236*  BILITOT  --   --  0.7   Estimated Creatinine Clearance: 54.6 mL/min (by C-G formula based on SCr of 0.97 mg/dL).  I have reviewed all the lab results.  The tacrolimus level from 11/17/2022 was drawn 7.5 hours post dose which is an inaccurate level. The report level of 3.3 ng/mL is closer to 3 ng/mL. Discussed with transplant team at [facility]- Transplant coordinator [phone] On call coordinator paging service: [phone]   Microbiology: Recent Results (from the past 720 hour(s))  MRSA Next Gen by PCR, Nasal     Status: None   Collection Time: 11/12/22 12:29 AM   Specimen: Nasal Mucosa; Nasal Swab  Result Value Ref Range Status   MRSA by PCR Next Gen NOT DETECTED NOT DETECTED Final    Comment: (NOTE) The GeneXpert MRSA Assay (FDA approved for NASAL specimens only), is one component of a comprehensive MRSA colonization surveillance program. It is not intended to diagnose MRSA infection nor to  guide or monitor treatment for MRSA infections. Test performance is not FDA approved in patients less than 84 years old. Performed at [facility], [place] [address] [place], [place] [address]     Medical History: Past Medical History:  Diagnosis Date   Ascites    Dysrhythmia    hx of SVT   Encephalopathy, hepatic ([place]) 05/13/2018   GERD (gastroesophageal reflux disease)    History of exercise stress test    03-07-2010  Stress echo--- no arrhythmias or conduction abnormalilites and negative for ischemia or chest pain   History of kidney stones    History of paroxysmal supraventricular tachycardia    episode 2011  consult w/ dr Caryl Comes --  put on atenolol--  per pt no longer an issue   HTN (hypertension)    cardiologist --  dr Stanford Breed   IBS (irritable bowel syndrome)    diarrhea   Nonalcoholic steatohepatitis ([facility])    Numbness and tingling of left lower extremity    post achilles tendon repair   OA (osteoarthritis)    knees   Osteoporosis    Pleural effusion on right    hepatic hydrothorax   PMB (postmenopausal bleeding) none recent   thickened endometrium   PONV (postoperative nausea and vomiting)    Recurrent UTI  none since 2019   Secondary esophageal varices without bleeding ([place]) 06/03/2018   Vitamin D deficiency    Wears glasses     Medications:  Scheduled:   calcium-vitamin D  2 tablet Oral BID WC   cholecalciferol  400 Units Oral Daily   docusate sodium  100 mg Oral BID   enoxaparin (LOVENOX) injection  30 mg Subcutaneous Q12H   ezetimibe  10 mg Oral Daily   lidocaine  3 patch Transdermal Q24H   methocarbamol  1,000 mg Oral Q8H   MG Plus Protein  3 tablet Oral BID   mycophenolate  1,000 mg Oral Q12H   pantoprazole  40 mg Oral Daily   predniSONE  5 mg Oral Daily   tacrolimus  1 mg Oral Once   tacrolimus  4 mg Oral Q12H    Assessment: 66 year-old female who is s/p Liver transplant in June 2023 at [facility] needing close tacrolimus monitoring  and follow-up  Goal of Therapy:  Tacrolimus trough level target is ~5 ng/mL drawn 30 minutes prior to the morning dose  Plan:  Changed dose to 4 mg po q12h starting this morning Patient was given an additional 1 mg dose this morning 2/2 already receiving prior scheduled dose (3 mg) Tacrolimus trough level 11/23/2022 at 08:30   Thank you for allowing Pharmacy to participate in the care of your patient  Hildred Laser, PharmD, BCPS Clinical Pharmacist 11/19/2022 11:33 AM  Contact: [phone] after 3 PM  "Be curious, not judgmental..." -Jamal Maes

## 2022-11-19 NOTE — Progress Notes (Signed)
[facility] Individual Statement of Services  Patient Name:  Dawn Moon  Date:  11/19/2022  Welcome to the [facility].  Our goal is to provide you with an individualized program based on your diagnosis and situation, designed to meet your specific needs.  With this comprehensive rehabilitation program, you will be expected to participate in at least 3 hours of rehabilitation therapies Monday-Friday, with modified therapy programming on the weekends.  Your rehabilitation program will include the following services:  Physical Therapy (PT), Occupational Therapy (OT), 24 hour per day rehabilitation nursing, Therapeutic Recreaction (TR), Care Coordinator, Rehabilitation Medicine, Nutrition Services, and Pharmacy Services  Weekly team conferences will be held on Wednesday to discuss your progress.  Your Inpatient Rehabilitation Care Coordinator will talk with you frequently to get your input and to update you on team discussions.  Team conferences with you and your family in attendance may also be held.  Expected length of stay: 5-7 days  Overall anticipated outcome: mod/I-supervision-tub and car transfer  Depending on your progress and recovery, your program may change. Your Inpatient Rehabilitation Care Coordinator will coordinate services and will keep you informed of any changes. Your Inpatient Rehabilitation Care Coordinator's name and contact numbers are listed  below.  The following services may also be recommended but are not provided by the [facility] will be made to provide these services after discharge if needed.  Arrangements include referral to agencies that provide these services.  Your insurance has been verified to be:  [employer] Your primary doctor is:  Bradly Bienenstock  Pertinent information will be shared with your doctor and your insurance company.  Inpatient Rehabilitation Care Coordinator:  Ovidio Kin, [place] or Emilia Beck  Information discussed with and copy given to patient by: [name], 11/19/2022, 9:48 AM

## 2022-11-19 NOTE — Plan of Care (Signed)
Problem: RH Balance Goal: LTG: Patient will maintain dynamic sitting balance (OT) Description: LTG:  Patient will maintain dynamic sitting balance with assistance during activities of daily living (OT) Flowsheets (Taken 11/19/2022 2145) LTG: Pt will maintain dynamic sitting balance during ADLs with: Independent with assistive device Goal: LTG Patient will maintain dynamic standing with ADLs (OT) Description: LTG:  Patient will maintain dynamic standing balance with assist during activities of daily living (OT)  Flowsheets (Taken 11/19/2022 2145) LTG: Pt will maintain dynamic standing balance during ADLs with: Independent with assistive device   Problem: Sit to Stand Goal: LTG:  Patient will perform sit to stand in prep for activites of daily living with assistance level (OT) Description: LTG:  Patient will perform sit to stand in prep for activites of daily living with assistance level (OT) Flowsheets (Taken 11/19/2022 2145) LTG: PT will perform sit to stand in prep for activites of daily living with assistance level: Independent with assistive device   Problem: RH Eating Goal: LTG Patient will perform eating w/assist, cues/equip (OT) Description: LTG: Patient will perform eating with assist, with/without cues using equipment (OT) Flowsheets (Taken 11/19/2022 2145) LTG: Pt will perform eating with assistance level of: Independent   Problem: RH Grooming Goal: LTG Patient will perform grooming w/assist,cues/equip (OT) Description: LTG: Patient will perform grooming with assist, with/without cues using equipment (OT) Flowsheets (Taken 11/19/2022 2145) LTG: Pt will perform grooming with assistance level of: Independent   Problem: RH Bathing Goal: LTG Patient will bathe all body parts with assist levels (OT) Description: LTG: Patient will bathe all body parts with assist levels (OT) Flowsheets (Taken 11/19/2022 2145) LTG: Pt will perform bathing with assistance level/cueing: Independent  with assistive device    Problem: RH Dressing Goal: LTG Patient will perform upper body dressing (OT) Description: LTG Patient will perform upper body dressing with assist, with/without cues (OT). Flowsheets (Taken 11/19/2022 2145) LTG: Pt will perform upper body dressing with assistance level of: Independent with assistive device Goal: LTG Patient will perform lower body dressing w/assist (OT) Description: LTG: Patient will perform lower body dressing with assist, with/without cues in positioning using equipment (OT) Flowsheets (Taken 11/19/2022 2145) LTG: Pt will perform lower body dressing with assistance level of: Independent with assistive device   Problem: RH Toileting Goal: LTG Patient will perform toileting task (3/3 steps) with assistance level (OT) Description: LTG: Patient will perform toileting task (3/3 steps) with assistance level (OT)  Flowsheets (Taken 11/19/2022 2145) LTG: Pt will perform toileting task (3/3 steps) with assistance level: Independent with assistive device   Problem: RH Functional Use of Upper Extremity Goal: LTG Patient will use RT/LT upper extremity as a (OT) Description: LTG: Patient will use right/left upper extremity as a stabilizer/gross assist/diminished/nondominant/dominant level with assist, with/without cues during functional activity (OT) Flowsheets (Taken 11/19/2022 2145) LTG: Use of upper extremity in functional activities:  RUE as dominant level  LUE as nondominant level LTG: Pt will use upper extremity in functional activity with assistance level of: Independent with assistive device   Problem: RH Simple Meal Prep Goal: LTG Patient will perform simple meal prep w/assist (OT) Description: LTG: Patient will perform simple meal prep with assistance, with/without cues (OT). Flowsheets (Taken 11/19/2022 2145) LTG: Pt will perform simple meal prep with assistance level of: Supervision/Verbal cueing   Problem: RH Light Housekeeping Goal: LTG  Patient will perform light housekeeping w/assist (OT) Description: LTG: Patient will perform light housekeeping with assistance, with/without cues (OT). Flowsheets (Taken 11/19/2022 2145) LTG: Pt will perform light housekeeping  with assistance level of: Supervision/Verbal cueing   Problem: RH Toilet Transfers Goal: LTG Patient will perform toilet transfers w/assist (OT) Description: LTG: Patient will perform toilet transfers with assist, with/without cues using equipment (OT) Flowsheets (Taken 11/19/2022 2145) LTG: Pt will perform toilet transfers with assistance level of: Independent with assistive device   Problem: RH Tub/Shower Transfers Goal: LTG Patient will perform tub/shower transfers w/assist (OT) Description: LTG: Patient will perform tub/shower transfers with assist, with/without cues using equipment (OT) Flowsheets (Taken 11/19/2022 2145) LTG: Pt will perform tub/shower stall transfers with assistance level of: Supervision/Verbal cueing

## 2022-11-19 NOTE — Progress Notes (Signed)
Slept fairly well last night. Pain medications required within the shift. Ambulatory with assistance to bathroom. Sore at the core and ribs. Deep breathing exercise encouraged. Safety maintained.
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
## 2022-11-19 NOTE — Progress Notes (Signed)
Inpatient Rehabilitation  Patient information reviewed and entered into eRehab system by Niketa Turner Ab Leaming, OTR/L, Rehab Quality Coordinator.   Information including medical coding, functional ability and quality indicators will be reviewed and updated through discharge.   

## 2022-11-20 DIAGNOSIS — T07XXXA Unspecified multiple injuries, initial encounter: Secondary | ICD-10-CM | POA: Diagnosis not present

## 2022-11-20 LAB — MAGNESIUM: Magnesium: 1.9 mg/dL (ref 1.7–2.4)

## 2022-11-20 MED ORDER — METOPROLOL TARTRATE 12.5 MG HALF TABLET
12.5000 mg | ORAL_TABLET | Freq: Two times a day (BID) | ORAL | Status: DC
  Administered 2022-11-20 – 2022-11-23 (×7): 12.5 mg via ORAL
  Filled 2022-11-20 (×7): qty 1
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
## 2022-11-20 NOTE — Progress Notes (Signed)
Occupational Therapy Session Note  Patient Details  Name: Carrine Kroboth MRN: [id number] Date of Birth: 1956/06/07  Today's Date: 11/20/2022 OT Individual Time: 5872-7618 OT Individual Time Calculation (min): 30 min    Short Term Goals: Week 1:  OT Short Term Goal 1 (Week 1): STG=LTG's d/t ELOS  Skilled Therapeutic Interventions/Progress Updates:  Pt greeted seated in recliner, pt reports increased pain in chest, pt waiting on pain meds from nurse. Session focus on education related to AE and energy conservation. Education provided on all LB AE for bathing and dressing, retrieved dressing stick to demo to pt with pt reporting she would like one to use for her briefs, education provided on where to purchase and technique. Education and demo also provided on [place] shoe horn, education provided on recommendation of which one to purchase from [place].   Reiterated education on energy conservation with pt able to provide functional example of when pt may use techniques, also oriented pt to health resource notebook to keep up with important educational handouts.  Ended session with pt seated in recliner with all needs within reach and nurse present.    Therapy Documentation Precautions:  Precautions Precautions: Fall, Back Precaution Booklet Issued: No Precaution Comments: Left 10th and right 4th rib fractures; no brace required for back, T3 fracture Required Braces or Orthoses: Spinal Brace Spinal Brace: Thoracolumbosacral orthotic (for comfort) Restrictions Weight Bearing Restrictions: No RLE Weight Bearing: Weight bearing as tolerated  Pain: unrated pain in chest with nurse provided meds during session.     Therapy/Group: Individual Therapy  Precious Haws 11/20/2022, 12:19 PM

## 2022-11-20 NOTE — Progress Notes (Addendum)
Patient ID: Dawn Moon, female   DOB: 29-May-1956, 66 y.o.   MRN: [id number]  Team feels pt will be ready for discharge on Wed 12/6 and will meet her goals by then. Have reached out to MD to see if will be medically ready then also.  2:00 PM MD feels will be medically stable for discharge 12/6. Alvis Lemmings has accepted referral for home health follow up.

## 2022-11-20 NOTE — Progress Notes (Signed)
Physical Therapy Session Note  Patient Details  Name: Dawn Moon MRN: [id number] Date of Birth: November 11, 1956  Today's Date: 11/20/2022 PT Individual Time: 0915-1015 PT Individual Time Calculation (min): 60 min   Short Term Goals: Week 1:  PT Short Term Goal 1 (Week 1): STG=LTG secondary to ELOS  Skilled Therapeutic Interventions/Progress Updates:  Patient greeted sitting upright in bedside recliner with physician present and agreeable to PT treatment session. Patient performed sit/stand without the use of an AD and SBA- Patient then gait trained from her room to main rehab gym without the use of an AD and SBA for safety. Patient completed gait trial without the use of TLSO with good ability, however increased pain reported- Patient donned TLSO with MaxA from therapist and kept it on for the remainder of the treatment session.   Patient performed dynamic gait activities in the hallway in order to improve dynamic stability for improved functional mobility- Patient weaved between x8 cones with SBA- VC for increased cadence and improved B step length. Patient then stepped fwd, laterally, bkwd, laterally through x8 cones with SBA for safety- Performed x1 trial in each direction. Extended seated rest break required after activities secondary to reports of pain/discomfort.   Patient performed step-ups to 4" step without UE support, x5 with each LE with CGA for safety- Patient initially limited by fear, however with increased repetitions demonstrated improvements. Reported sternal discomfort with activity, however able to continue.   Patient ambulated back to her room without the use of an AD and Supv for safety. Patient left sitting on the toilet in her room and agreeable to pulling the cord when ready to stand- NT notified and aware.    Therapy Documentation Precautions:  Precautions Precautions: Fall, Back Precaution Booklet Issued: No Precaution Comments: Left 10th and right 4th rib  fractures; no brace required for back, T3 fracture Required Braces or Orthoses: Spinal Brace Spinal Brace: Thoracolumbosacral orthotic (for comfort) Restrictions Weight Bearing Restrictions: No RLE Weight Bearing: Weight bearing as tolerated  Therapy/Group: Individual Therapy  Shannah Conteh 11/20/2022, 7:50 AM

## 2022-11-20 NOTE — Progress Notes (Signed)
Occupational Therapy Session Note  Patient Details  Name: Dawn Moon MRN: [id number] Date of Birth: 02-26-1956  Session 1  Today's Date: 11/20/2022 OT Individual Time: 5638-7564 OT Individual Time Calculation (min): 70 min    Session 2  Today's Date: 11/20/2022 OT Individual Time: 3329-5188 OT Individual Time Calculation (min): 80 min    Short Term Goals: Week 1:  OT Short Term Goal 1 (Week 1): STG=LTG's d/t ELOS  Skilled Therapeutic Interventions/Progress Updates:    Session 1  Pt received in the recliner reporting 5/10 pain in her back, reporting she is premedicated and she considers this managed with no request for intervention further. Discussed PLOF and spent time building therapeutic rapport. Pt completed sit > stand from recliner with CGA. Pt completed an ambulatory transfer with CGA to the sink. She declined wearing TLSO at this time. She completed oral care in standing with min cueing for technique to reduce bending over at the waist. Demonstrated use of a reacher for LB dressing and pt was able to return demo and don with CGA. Discussed pain and swelling in the R lower leg. Pt agreeable to try ted hose but after donning on the L pt stating hospital size L was too tight for the painful RLE. Discussed elevating RLE during the day and will reassess need for ace wrap later. Pt completed 100 ft of functional mobility to the therapy gym with CGA, no device. Discussed pain management including heat and ice. She then completed BUE AAROM in the mobile arm support. 2/2 pain pt very restricted in shoulder AROM- reaching R to about 50 degrees and L to 75 degrees. With the MAS pt able to actively get 100-110 degrees with less pain. Pt returned to the room via 100 ft of functional mobility with CGA. She was provided an ice pack for her RLE and a heat pack for her sternum.  Pt was left sitting up in the recliner with all needs met, chair alarm set, and call bell within reach.      Session 2 Pt received sitting in the recliner with her husband present. She reported pain was managed and was agreeable to OT session beginning with shower. She came to standing with CGA from the recliner. CGA for ambulatory transfer into the bathroom with cueing for reducing furniture walking/support. Pt used the bathroom first and was able to get on/off regular height toilet with CGA using grab bar (which she has at home). Discussed shower equipment needs with her and her husband. Pt completed bathing on the TTB with close (S) using LH sponge and standing with the grab bar for support. She transferred back to the recliner to dress following. She donned a shirt with increase in pain at her sternum but improved reaching to get overhead. Pt required min A to don depends and pants d/t not having reacher. Pt suddenly got hiccups which were very painful for her. Provided a pillow to brace sternum and ribs and incentive spirometer to try and relax diaphragm. They subsided within several minutes. She donned her TLSO with mod A and then completed 100 ft of functional mobility to the therapy gym. She completed 1 set of 5 sit <> stands without UE support to progress in shower transfers without grab bar support. Min A required. She returned to her room and to bed. She required mod cueing for log rolling technique when getting back to bed. She was left supine with her husband present and all needs met. Bed alarm set.  Therapy Documentation Precautions:  Precautions Precautions: Fall, Back Precaution Booklet Issued: No Precaution Comments: Left 10th and right 4th rib fractures; no brace required for back, T3 fracture Required Braces or Orthoses: Spinal Brace Spinal Brace: Thoracolumbosacral orthotic (for comfort) Restrictions Weight Bearing Restrictions: No RLE Weight Bearing: Weight bearing as tolerated   Therapy/Group: Individual Therapy  Curtis Sites 11/20/2022, 6:17 AM

## 2022-11-21 DIAGNOSIS — S22030S Wedge compression fracture of third thoracic vertebra, sequela: Secondary | ICD-10-CM

## 2022-11-21 DIAGNOSIS — I10 Essential (primary) hypertension: Secondary | ICD-10-CM | POA: Diagnosis not present

## 2022-11-21 DIAGNOSIS — R52 Pain, unspecified: Secondary | ICD-10-CM | POA: Diagnosis not present

## 2022-11-21 NOTE — Progress Notes (Signed)
Occupational Therapy Session Note  Patient Details  Name: Dawn Moon MRN: [id number] Date of Birth: Dec 08, 1956  Today's Date: 11/21/2022 OT Individual Time: 1555-1640 OT Individual Time Calculation (min): 45 min    Short Term Goals: Week 1:  OT Short Term Goal 1 (Week 1): STG=LTG's d/t ELOS  Skilled Therapeutic Interventions/Progress Updates:    Upon OT arrival, pt seated in recliner with spouse and sister present in room. Pt agreeable to OT treatment and reports pain in R latissimus dorsi and sternum. Treatment intervention with a focus on strengthening per pt request. Pt donns TLSO with Min A while seated and standing with CGA and ambulates to nurses station with CGA to ask for pain medication. Pt continues to ambulate to dayroom with CGA and sits EOM with CGA. While seated EOM, pt completes B UE exercises for 2x10 reps with 3lb dumbbell for elbow flex/ext, supina/prona, and wrist flex/ext. Pt completes shoulder abd/add, shoulder flex/ext, chest press without weight. Pt tolerates exercises well and ambulates back to her room with CGA. Pt requests to use the bathroom and completes toilet transfer with CGA, toileting with CGA, hand hygiene with CGA, doffs brace with CGA while standing and doffs pants with CGA. Pt performs sit to supine transfer with SBA and requires assist of 2 to scoot towards HOB. Pt was left in bed at end of session with all needs met and spouse at bedside.  Therapy Documentation Precautions:  Precautions Precautions: Fall, Back Precaution Booklet Issued: No Precaution Comments: Left 10th and right 4th rib fractures; no brace required for back, T3 fracture Required Braces or Orthoses: Spinal Brace Spinal Brace: Thoracolumbosacral orthotic (for comfort) Restrictions Weight Bearing Restrictions: No RLE Weight Bearing: Weight bearing as tolerated   Therapy/Group: Individual Therapy  Marvetta Gibbons 11/21/2022, 4:07 PM

## 2022-11-21 NOTE — IPOC Note (Signed)
Overall Plan of Care [facility]) Patient Details Name: Dawn Moon MRN: [id number] DOB: 02/02/1956  Admitting Diagnosis: Thoracic compression fracture [facility])  Hospital Problems: Principal Problem:   Thoracic compression fracture [facility])     Functional Problem List: Nursing Perception, Bladder, Bowel, Safety, Sensory, Edema, Skin Integrity, Endurance, Medication Management, Motor, Pain  PT Balance, Edema, Endurance, Pain, Nutrition  OT Balance, Endurance, Motor, Pain  SLP    TR         Basic ADL's: OT Eating, Grooming, Bathing, Dressing, Toileting     Advanced  ADL's: OT Simple Meal Preparation, Light Housekeeping     Transfers: PT Bed Mobility, Bed to Chair, Teacher, early years/pre, Tub/Shower     Locomotion: PT Ambulation, Stairs     Additional Impairments: OT Fuctional Use of Upper Extremity  SLP        TR      Anticipated Outcomes Item Anticipated Outcome  Self Feeding indep  Swallowing      Basic self-care  mod I  Toileting  mod i   Bathroom Transfers S  Bowel/Bladder  continent x 2  Transfers  ModI with LRAD  Locomotion  ModI with LRAD  Communication     Cognition     Pain  less than 4  Safety/Judgment  remain fall free while in rehab   Therapy Plan: PT Intensity: Minimum of 1-2 x/day ,45 to 90 minutes PT Frequency: 5 out of 7 days PT Duration Estimated Length of Stay: 5-7 days OT Intensity: Minimum of 1-2 x/day, 45 to 90 minutes OT Frequency: 5 out of 7 days OT Duration/Estimated Length of Stay: 5-7 days     Team Interventions: Nursing Interventions Patient/Family Education, Pain Management, Bladder Management, Medication Management, Discharge Planning, Bowel Management, Skin Care/Wound Management, Psychosocial Support, Disease Management/Prevention  PT interventions Ambulation/gait training, Community reintegration, DME/adaptive equipment instruction, Neuromuscular re-education, Psychosocial support, Stair training, UE/LE Strength  taining/ROM, Training and development officer, Discharge planning, Pain management, Skin care/wound management, Therapeutic Activities, UE/LE Coordination activities, Cognitive remediation/compensation, Disease management/prevention, Functional mobility training, Patient/family education, Splinting/orthotics, Therapeutic Exercise, Visual/perceptual remediation/compensation  OT Interventions Balance/vestibular training, Discharge planning, Pain management, Self Care/advanced ADL retraining, Therapeutic Activities, UE/LE Coordination activities, Disease mangement/prevention, Functional mobility training, Patient/family education, Therapeutic Exercise, Community reintegration, Engineer, drilling, Neuromuscular re-education, Psychosocial support, UE/LE Strength taining/ROM  SLP Interventions    TR Interventions    SW/CM Interventions Discharge Planning, Psychosocial Support, Patient/Family Education   Barriers to Discharge MD  Medical stability  Nursing Decreased caregiver support, Weight home with spouse 1 level 3 ste rails on right and left  PT Insurance for SNF coverage    OT Inaccessible home environment steps to enter and pain  SLP      SW Weight bearing restrictions     Team Discharge Planning: Destination: PT-Home ,OT- Home , SLP-  Projected Follow-up: PT-Outpatient PT, OT-  Outpatient OT, Home health OT, SLP-  Projected Equipment Needs: PT-To be determined, OT-  , SLP-  Equipment Details: PT- , OT-pt has rollator and comfort height toilet wiht small shower seat Patient/family involved in discharge planning: PT- Patient,  OT-Patient, Family member/caregiver, SLP-   MD ELOS: 5-7 days Medical Rehab Prognosis:  Excellent Assessment: The patient has been admitted for CIR therapies with the diagnosis of T3 Compression fx. The team will be addressing functional mobility, strength, stamina, balance, safety, adaptive techniques and equipment, self-care, bowel and bladder mgt,  patient and caregiver education, pain mgt, orthotic mgt, community reentry. Goals have been set at mod I for  self-care and mobility. Anticipated discharge destination is home.        See Team Conference Notes for weekly updates to the plan of care

## 2022-11-21 NOTE — Progress Notes (Signed)
Occupational Therapy Session Note  Patient Details  Name: Dawn Moon MRN: [id number] Date of Birth: 14-Nov-1956  Today's Date: 11/21/2022 OT Individual Time: 6759-1638 OT Individual Time Calculation (min): 60 min    Short Term Goals: Week 1:  OT Short Term Goal 1 (Week 1): STG=LTG's d/t ELOS  Skilled Therapeutic Interventions/Progress Updates:    Upon OT arrival, pt semi recumbent in bed and reports no pain without movement. Pt agreeable to OT session and requests to shower. Pt performs full shower ADL at the levels below. Pt states "Thank you for letting me try to do everything. It made me feel more confident". Pt ambulates within room without DME or TLSO and tolerates well with CGA. Pt blow dries her hair at end of session while seated with Supervision and ambulates to recliner with CGA where she was left at end of session with all needs met.   Therapy Documentation Precautions:  Precautions Precautions: Fall, Back Precaution Booklet Issued: No Precaution Comments: Left 10th and right 4th rib fractures; no brace required for back, T3 fracture Required Braces or Orthoses: Spinal Brace Spinal Brace: Thoracolumbosacral orthotic (for comfort) Restrictions Weight Bearing Restrictions: No RLE Weight Bearing: Weight bearing as tolerated   ADL: ADL Equipment Provided: Long-handled shoe horn Eating: Minimal assistance Where Assessed-Eating: Chair Grooming: Contact guard Where Assessed-Grooming: Sitting at sink, Standing at sink Upper Body Bathing: Supervision/safety Where Assessed-Upper Body Bathing: Shower Lower Body Bathing: Contact guard Where Assessed-Lower Body Bathing: Shower Upper Body Dressing: Contact guard Where Assessed-Upper Body Dressing: Chair Lower Body Dressing: Contact guard Where Assessed-Lower Body Dressing: Sitting at sink, Standing at sink Toileting: Contact guard Where Assessed-Toileting: Glass blower/designer: Therapist, music  Method: Counselling psychologist: Energy manager: Curator Method: Heritage manager: Radio broadcast assistant ADL Comments: Pt dries her legs off standing on one leg with a LOB but was able to self correct   Therapy/Group: Individual Therapy  Marvetta Gibbons 11/21/2022, 11:51 AM
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
## 2022-11-21 NOTE — Progress Notes (Signed)
Physical Therapy Session Note  Patient Details  Name: Dawn Moon MRN: [id number] Date of Birth: February 15, 1956  Today's Date: 11/21/2022 PT Individual Time: 1st Treatment Session: 1100-1200; 2nd Treatment Session: 4098-1191 PT Individual Time Calculation (min): 60 min; 45 min  Short Term Goals: Week 1:  PT Short Term Goal 1 (Week 1): STG=LTG secondary to ELOS  Skilled Therapeutic Interventions/Progress Updates:  1st Treatment Session- Patient greeted sitting upright in bedside recliner in room and agreeable to PT treatment session. Patient requesting to use the restroom prior to starting treatment session- patient ambulated from recliner to bathroom without AD and supv. Patient was able to manage clothing and pericare with supv. Patient stood in room while RN administered pain medication. Patient then gait trained from room to ortho gym (>200') without an AD and Supv- Patient demonstrated x1 moderate LOB, however was able to demonstrate appropriate use of her stepping strategy and CGA from therapist for safety. Patient gait trained from ortho gym to the elevators, down to the first floor, outside and then back up to the day room gym on the 4th floor without the use of an AD and Supv. Patient required x1 seated rest break in the atrium secondary to fatigue. Patient demonstrated good stability while ambulating over uneven surface, thresholds, up/down low grade ramps, etc. Once back in the gym, patient performed x10 sit/stands with S UE support and SBA. Patient ambulated back to her room with supv. Patient left sitting upright in bedside recliner with call bell within reach, tray table in front, spouse present and all needs met.    2nd Treatment Session- Patient greeted supine in bed with spouse, [name], present and agreeable to PT treatment session. Patient transitioned from semi-reclined to sitting EOB with Supv and use of bed rail. While standing, patient donned TLSO (for comfort) with MinA-  Educated spouse while in the room as patient will require assistance at home. Patient requested to use the restroom and was able to manage clothing and perform pericare with Supv. Patient gait trained to the rehab gym from her room without the use of an AD and Supv. Patient completed the Nustep x15 minutes on level 5 in order to address strength and endurance for improved functional mobility. Patient completed an obstacle course consisting of hurdles, weaving around cones and stepping onto/off of airex foam without the use of an AD and CGA/SBA- Patient completed x2 trials and then stepped over the hurdles x4 trials with cues for using a reciprocal stepping pattern instead of step-to in order to further challenge her balance with significant improvement noted and no LOB. Patient ambulated back to her room without the use of an AD and supv- Patient left sitting upright in recliner chair with spouse and sister present, call bell within reach and all needs met.    Therapy Documentation Precautions:  Precautions Precautions: Fall, Back Precaution Booklet Issued: No Precaution Comments: Left 10th and right 4th rib fractures; no brace required for back, T3 fracture Required Braces or Orthoses: Spinal Brace Spinal Brace: Thoracolumbosacral orthotic (for comfort) Restrictions Weight Bearing Restrictions: No RLE Weight Bearing: Weight bearing as tolerated   Therapy/Group: Individual Therapy  Celest Reitz 11/21/2022, 7:47 AM
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
## 2022-11-23 ENCOUNTER — Telehealth: Payer: Self-pay

## 2022-11-23 DIAGNOSIS — Z944 Liver transplant status: Secondary | ICD-10-CM | POA: Diagnosis not present

## 2022-11-23 DIAGNOSIS — I10 Essential (primary) hypertension: Secondary | ICD-10-CM | POA: Diagnosis not present

## 2022-11-23 DIAGNOSIS — M1710 Unilateral primary osteoarthritis, unspecified knee: Secondary | ICD-10-CM

## 2022-11-23 DIAGNOSIS — S22030S Wedge compression fracture of third thoracic vertebra, sequela: Secondary | ICD-10-CM | POA: Diagnosis not present

## 2022-11-23 LAB — COMPREHENSIVE METABOLIC PANEL
ALT: 43 U/L (ref 0–44)
AST: 39 U/L (ref 15–41)
Albumin: 3.5 g/dL (ref 3.5–5.0)
Alkaline Phosphatase: 244 U/L — ABNORMAL HIGH (ref 38–126)
Anion gap: 7 (ref 5–15)
BUN: 16 mg/dL (ref 8–23)
CO2: 22 mmol/L (ref 22–32)
Calcium: 8 mg/dL — ABNORMAL LOW (ref 8.9–10.3)
Chloride: 105 mmol/L (ref 98–111)
Creatinine, Ser: 1 mg/dL (ref 0.44–1.00)
GFR, Estimated: >60 mL/min (ref >60)
Glucose, Bld: 88 mg/dL (ref 70–99)
Potassium: 4.1 mmol/L (ref 3.5–5.1)
Sodium: 134 mmol/L — ABNORMAL LOW (ref 135–145)
Total Bilirubin: 0.6 mg/dL (ref 0.3–1.2)
Total Protein: 6.7 g/dL (ref 6.5–8.1)

## 2022-11-23 MED ORDER — METOPROLOL TARTRATE 25 MG PO TABS
25.0000 mg | ORAL_TABLET | Freq: Two times a day (BID) | ORAL | Status: DC
  Administered 2022-11-23 – 2022-11-25 (×4): 25 mg via ORAL
  Filled 2022-11-23 (×4): qty 1

## 2022-11-23 MED ORDER — DICLOFENAC SODIUM 1 % EX GEL: 4.0000 g | Freq: Four times a day (QID) | CUTANEOUS | Status: DC | PRN
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
## 2022-11-23 NOTE — Progress Notes (Signed)
Occupational Therapy Session Note  Patient Details  Name: Dawn Moon MRN: [id number] Date of Birth: 02-28-1956  Today's Date: 11/23/2022 OT Individual Time: 3295-1884 OT Individual Time Calculation (min): 60 min    Short Term Goals: Week 1:  OT Short Term Goal 1 (Week 1): STG=LTG's d/t ELOS  Skilled Therapeutic Interventions/Progress Updates:    Pt received supine with no c/o pain, agreeable to OT session. She completed bed mobility with mod I, using bed rail. She completed functional mobility into the bathroom with (S), requiring cueing to not rely on furniture/doorways for support. She completed toileting tasks and voided urine with (S). She transferred into the shower with (S) and doffed clothing. Bathing completed from TTB with close (S). She transferred out of the shower and to a chair to dress. She was able to don pants with out a reacher with (S). UB clothing with (S). OT assisted with donning TLSO in standing. She completed 100 ft of functional mobility to the therapy gym with close (S). Remainder of session focused on narrow BOS stepping over 6 in hurdles to simulate home threshold management and challenge dynamic balance support. CGA- (S) required overall. She ended with 250 ft of functional mobility with cueing provided for increasing arm swing. Pt was left sitting up in the recliner with all needs met and call bell within reach.      Therapy Documentation Precautions:  Precautions Precautions: Fall, Back Precaution Booklet Issued: No Precaution Comments: Left 10th and right 4th rib fractures; no brace required for back, T3 fracture Required Braces or Orthoses: Spinal Brace Spinal Brace: Thoracolumbosacral orthotic (for comfort) Restrictions Weight Bearing Restrictions: No RLE Weight Bearing: Weight bearing as tolerated  Therapy/Group: Individual Therapy  Curtis Sites 11/23/2022, 6:14 AM

## 2022-11-23 NOTE — Telephone Encounter (Signed)
Called to discuss PREP class schedule for December, she is currently admitted to  inpt Rehab at [facility]; since she has had change in status with AA, will need another referral to the PREP program, once she is cleared medically to participate. Asked her to contact me once she is stable/discharged and will attempt to obtain the referral and get her started in class in 2024

## 2022-11-23 NOTE — Progress Notes (Signed)
Physical Therapy Session Note  Patient Details  Name: Dawn Moon MRN: [id number] Date of Birth: 1956-04-01  Today's Date: 11/23/2022 PT Individual Time: 1st Treatment Session: 1045-1200; 2nd Treatment Session: 5945-8592 PT Individual Time Calculation (min): 75 min; 75 min  Short Term Goals: Week 1:  PT Short Term Goal 1 (Week 1): STG=LTG secondary to ELOS  Skilled Therapeutic Interventions/Progress Updates:  1st Treatment Session- Patient greeted sitting upright in bedside recliner and agreeable to PT treatment session. Patient stood ModI and was able to don TLSO with MinA. Patient ambulated to day room without AD and Supv- patient reporting feeling "dizzy" and attributes it to her pain medication, however vitals assessed with BP 151/80 and HR 72 while sitting on mat table.   Patient voicing concern regarding if she will be able to carry items once discharged home- Patient does not have set lifting restrictions, however due to T3 fracture educated patient regarding only lifting 10 pounds at this time. Patient carried 5 pound dumbbells in each hand and ambulated x190' with distant supv. Patient with good stability noted and no LOB.    Patient performed sit/stand with 5# dumbbells in each hand, then performed bicep curl and then returned to a seated position, patient completed x10 trials.   Patient performed sit/stand with 5# dumbbells in each hand, stepped onto airex foam pad, performed alternating foot taps to 10" step, bicep curl stepped backward off foam, then returned to a seated position- Patient performed x5 trials with SBA and no LOB noted.   Patient gait trained >400' without AD and distant Supv- Patient was able to ambulate continuously without any standing rest breaks or LOBs noted.   Patient side-stepped onto/off of airex foam pad without UE support and SBA for safety- Patient completed x10 in each direction with minor LOBs, however appropriate use of ankle strategy.    Patient ambulated from day room to main gym without AD and supv- Patient ascended/descended x12 stairs with B HR and supv. Patient alternated between a step-to and reciprocal pattern with improved stability noted when using a step-to pattern.   Patient ambulated back to her room while carryover her BP cuff, cup of water and protein shake with distant supv. Patient left sitting upright in bedside recliner with call bell within reach, tray table nearby, husband present and all needs met.    2nd Treatment Session- Patient greeted sitting upright in bedside recliner in room with spouse present and agreeable to PT treatment session. Patient requesting to use the restroom prior to start of session and ambulated to/from the bathroom without the use of an AD and distant supv. Patient managed pants/brief and performed pericare independently. Patient ambulated from her room to day room without AD and distant supv. Patient verbalizing concern regarding reaching for kitchen items in her pantry/cabinets. Patient stood on airex foam pad and reached above her head for clothespins, x 5 with each UE- Patient reported increased pain with R UE compared to L UE.   Patient ambulated from day room to simulated kitchen without AD and Supv. While in the kitchen, patient went through each cabinet and removed items from the shelves/fridge and then returned them to their proper location. Patient ambulated throughout the simulated apartment room while picking up x6 hangers and hanging them back on the clothes rack. Patient was able to complete everything with distant supv- Educated on ways to improve independence at home. Patient ambulated back to day room with an extended seated rest break.   Patient gait trained >  300' without AD and distant supv with emphasis on increased gait speed- Patient tasked with keeping the same speed as therapist with good improvements and effort noted. Patient required an extended seated rest break  after gait trial secondary to notable SOB.   Patient completed Nustep x15 minutes on level 5 with B LE only in order to increased B LE strength and overall endurance/activity tolerance for improved functional mobility.   Patient ambulated back to her room without the use of an AD and Supv- Once in her room, patient was able to locate shorts and change out of her pants and into shorts with distant supv. Patient demonstrated good safety awareness.   Patient left sitting upright in bedside recliner with spouse present, call bell within reach and all needs met.    Therapy Documentation Precautions:  Precautions Precautions: Fall, Back Precaution Booklet Issued: No Precaution Comments: Left 10th and right 4th rib fractures; no brace required for back, T3 fracture Required Braces or Orthoses: Spinal Brace Spinal Brace: Thoracolumbosacral orthotic (for comfort) Restrictions Weight Bearing Restrictions: No RLE Weight Bearing: Weight bearing as tolerated   Therapy/Group: Individual Therapy  Vaibhav Fogleman 11/23/2022, 7:57 AM

## 2022-11-24 DIAGNOSIS — S22030S Wedge compression fracture of third thoracic vertebra, sequela: Secondary | ICD-10-CM | POA: Diagnosis not present

## 2022-11-24 DIAGNOSIS — I10 Essential (primary) hypertension: Secondary | ICD-10-CM | POA: Diagnosis not present

## 2022-11-24 LAB — TACROLIMUS LEVEL: Tacrolimus (FK506) - LabCorp: 4.4 ng/mL (ref 2.0–20.0)

## 2022-11-24 MED ORDER — TACROLIMUS 1 MG PO CAPS: 4.0000 mg | ORAL_CAPSULE | Freq: Two times a day (BID) | ORAL | 1 refills | Status: DC

## 2022-11-24 MED ORDER — METOPROLOL TARTRATE 25 MG PO TABS: 25.0000 mg | ORAL_TABLET | Freq: Two times a day (BID) | ORAL | 0 refills | Status: AC

## 2022-11-24 MED ORDER — METHOCARBAMOL 1000 MG PO TABS: 1000.0000 mg | ORAL_TABLET | Freq: Three times a day (TID) | ORAL | 0 refills | Status: DC

## 2022-11-24 MED ORDER — OXYCODONE HCL 10 MG PO TABS: 10.0000 mg | ORAL_TABLET | ORAL | 0 refills | Status: DC | PRN

## 2022-11-24 NOTE — Progress Notes (Signed)
Inpatient Rehabilitation Care Coordinator Discharge Note   Patient Details  Name: Dawn Moon MRN: [id number] Date of Birth: [birth year]   Discharge location: [place] TO BE THERE WITH HER-FMLA FROM WORK  Length of Stay: 7 DAYS  Discharge activity level: MOD/I-SUPERVISION LEVEL  Home/community participation: ACTIVE  Patient response XK:GYJEHU Literacy - How often do you need to have someone help you when you read instructions, pamphlets, or other written material from your doctor or pharmacy?: Never  Patient response DJ:SHFWYO Isolation - How often do you feel lonely or isolated from those around you?: Never  Services provided included: MD, RD, PT, OT, RN, CM, Pharmacy, SW  Financial Services:  Financial Services Utilized: Medicare MEDICARE  Choices offered to/list presented to: PT  Follow-up services arranged:  Home Health, Patient/Family has no preference for HH/DME agencies [place]: [place]      Patient response to transportation need: Is the patient able to respond to transportation needs?: Yes In the past 12 months, has lack of transportation kept you from medical appointments or from getting medications?: No In the past 12 months, has lack of transportation kept you from meetings, work, or from getting things needed for daily living?: No    Comments (or additional information):PT DID WELL AND REACHED HER GOALS QUICKLY HUSBAND WAS IN FOR OBSERVATION. BOTH FEEL READY FOR DC HOME  Patient/Family verbalized understanding of follow-up arrangements:  Yes  Individual responsible for coordination of the follow-up plan: SELF AND Dawn Moon [phone]  Confirmed correct DME delivered: [name] 11/24/2022    Colton Tassin, Gardiner Rhyme

## 2022-11-24 NOTE — Progress Notes (Signed)
Occupational Therapy Discharge Summary  Patient Details  Name: Dawn Moon MRN: [id number] Date of Birth: 01-20-1956  Date of Discharge from [place] service:November 24, 2022  Today's Date: 11/24/2022 OT Individual Time: 1410-1505 OT Individual Time Calculation (min): 55 min    Patient has met 14 of 14 long term goals due to improved activity tolerance, improved balance, postural control, ability to compensate for deficits, functional use of  RIGHT upper and LEFT upper extremity, and improved coordination.  Patient to discharge at overall Modified Independent level.  Patient's care partner is independent to provide the necessary physical assistance at discharge.  Carle has made excellent progress in CIR, progressing significantly with her pain management and functional performance of ADLs. She is able to complete ADLs at a mod I level and her pain only slightly limits her participation in ADLs vs it preventing her from getting OOB like it did at eval. Her husband Dawn Moon will be able to assist with home tasks as needed.    Recommendation:  Patient will benefit from ongoing skilled OT services in home health setting to continue to advance functional skills in the area of BADL and iADL.  Equipment: No equipment provided  Reasons for discharge: treatment goals met and discharge from hospital  Patient/family agrees with progress made and goals achieved: Yes  Skilled OT Intervention: Pt received sitting with her husband present, 3/10 pain in her pec's not back, reporting she is premedicated. She completed functional mobility around her room with mod I and then donned TLSO with mod I. She completed 100 ft of functional mobility to the therapy gym with mod I. She completed 3x10 of sit <> stands without UE support. She then completed several dynamic balance activities focused on challenging ankle and hip righting reactions. Focus of activities on dynamic balance, postural control, and functional  activity tolerance. She was able to maintain her balance while completing UE task with functional reach with close (S). She completed 1000+ ft of functional mobility to the [place] to visit an RN that she previously had. Activity completed to challenge higher level dynamic balance and functional activity tolerance, as well as improve self esteem. She was left sitting in the recliner with all needs met.    OT Discharge Precautions/Restrictions  Precautions Precautions: Fall;Back Precaution Booklet Issued: No Precaution Comments: Left 10th and right 4th rib fractures; no brace required for back, T3 fracture Restrictions Weight Bearing Restrictions: No RLE Weight Bearing: Weight bearing as tolerated Pain Pain Assessment Pain Scale: 0-10 Pain Score: 4  Pain Type: Acute pain Pain Location: Chest Pain Descriptors / Indicators: Aching Pain Frequency: Constant Pain Onset: On-going Patients Stated Pain Goal: 2 Pain Intervention(s): Repositioned ADL ADL Equipment Provided: Long-handled shoe horn Eating: Independent Where Assessed-Eating: Chair Grooming: Independent Where Assessed-Grooming: Standing at sink Upper Body Bathing: Modified independent Where Assessed-Upper Body Bathing: Shower Lower Body Bathing: Modified independent Where Assessed-Lower Body Bathing: Shower Upper Body Dressing: Modified independent (Device) Where Assessed-Upper Body Dressing: Chair Lower Body Dressing: Modified independent Where Assessed-Lower Body Dressing: Chair Toileting: Modified independent Where Assessed-Toileting: Glass blower/designer: Diplomatic Services operational officer Method: Counselling psychologist: Energy manager: Chief Financial Officer Method: Heritage manager: Civil engineer, contracting with back ADL Comments: Pt dries her legs off standing on one leg with a LOB but was able to self correct Vision Baseline Vision/History:  1 Wears glasses Patient Visual Report: No change from baseline Vision Assessment?: No apparent visual deficits Perception  Perception: Within Functional  Limits Praxis Praxis: Intact Cognition Cognition Overall Cognitive Status: Within Functional Limits for tasks assessed Arousal/Alertness: Awake/alert Memory: Appears intact Awareness: Appears intact Problem Solving: Appears intact Safety/Judgment: Appears intact Brief Interview for Mental Status (BIMS) Repetition of Three Words (First Attempt): 3 Temporal Orientation: Year: Correct Temporal Orientation: Month: Accurate within 5 days Temporal Orientation: Day: Correct Recall: "Sock": Yes, no cue required Recall: "Blue": Yes, no cue required Recall: "Bed": Yes, no cue required BIMS Summary Score: 15 Sensation Sensation Light Touch: Appears Intact Hot/Cold: Appears Intact Proprioception: Appears Intact Coordination Gross Motor Movements are Fluid and Coordinated: Yes Fine Motor Movements are Fluid and Coordinated: Yes Coordination and Movement Description: Still with some guarding d/t pain resulting in slightly ataxic gait but overall WFL Motor  Motor Motor: Within Functional Limits Mobility  Bed Mobility Bed Mobility: Rolling Right;Rolling Left;Supine to Sit;Sit to Supine Rolling Right: Independent Rolling Left: Independent Supine to Sit: Independent with assistive device Sit to Supine: Independent with assistive device Transfers Sit to Stand: Independent with assistive device Stand to Sit: Independent with assistive device  Trunk/Postural Assessment  Cervical Assessment Cervical Assessment: Within Functional Limits Thoracic Assessment Thoracic Assessment: Exceptions to WFL (T9 fx with pain guarding) Lumbar Assessment Lumbar Assessment: Within Functional Limits  Balance Balance Balance Assessed: Yes Static Sitting Balance Static Sitting - Balance Support: Feet supported Static Sitting - Level of Assistance: 7:  Independent Dynamic Sitting Balance Dynamic Sitting - Balance Support: Feet supported Dynamic Sitting - Level of Assistance: 7: Independent Static Standing Balance Static Standing - Balance Support: No upper extremity supported Static Standing - Level of Assistance: 6: Modified independent (Device/Increase time) Dynamic Standing Balance Dynamic Standing - Balance Support: During functional activity Dynamic Standing - Level of Assistance: 6: Modified independent (Device/Increase time) Extremity/Trunk Assessment RUE Assessment RUE Assessment: Exceptions to [facility] Active Range of Motion (AROM) Comments: 0-130 degrees LUE Assessment LUE Assessment: Exceptions to [facility] Active Range of Motion (AROM) Comments: 0-130 degrees   Curtis Sites 11/24/2022, 8:33 AM
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
## 2022-11-24 NOTE — Patient Care Conference (Signed)
Inpatient RehabilitationTeam Conference and Plan of Care Update Date: 11/20/2022   Time: 09:42 AM    Patient Name: Dawn Moon      Medical Record Number: [id number]  Date of Birth: Mar 21, 1956 Sex: Female         Room/Bed: 4W01C/4W01C-01 Payor Info: Payor: MEDICARE / Plan: MEDICARE PART A AND B / Product Type: *No Product type* /    Admit Date/Time:  11/18/2022  1:49 PM  Primary Diagnosis:  Thoracic compression fracture [facility])  Hospital Problems: Principal Problem:   Thoracic compression fracture [facility])    Expected Discharge Date: Expected Discharge Date: 11/25/22  Team Members Present: Physician leading conference Dr. Alysia Penna  Nurse Present Dorien Chihuahua, RN  Social Worker Present Ovidio Kin, LCSW  PT Present Terence Lux, PT  OT Present Laverle Hobby, OT         Current Status/Progress Goal Weekly Team Focus  Bowel/Bladder   continent of B/B   remain continent of B/B   assess q shift and prn    Swallow/Nutrition/ Hydration               ADL's   At goal level- mod I for ADLs and transfers.   Mod I overall   D/c planning, balance, tolerance, pain management, ADLs    Mobility   ModI with all functional mobility without the use of an AD   ModI  Endurance/activity tolerance, pain management, global strengthening, etc    Communication                Safety/Cognition/ Behavioral Observations               Pain   patient reports 7 out of 10 pain, generalized   pain remain >5   assess q shift and prn    Skin   skin free of breakdown   skin remains free of breakdown and infection  assess q shift and prn      Discharge Planning:  Home with husband who can assist taking FMLA to provide supervision. HH arranged and has all DMEHome with spouse who works and has back issues, but will make sure someone is with her at DC. Very good family support.  Team Discussion: Team feels that the patient is doing well and will meet goals.    Patient on target to meet rehab goals: yes  *See Care Plan and progress notes for long and short-term goals.   Revisions to Treatment Plan:  N/a  Teaching Needs: Safety, medications, skin care, transfers, toileting, etc.   Current Barriers to Discharge: Decreased caregiver support  Possible Resolutions to Barriers: [facility] follow up services     Medical Summary               I attest that I was present, lead the team conference, and concur with the assessment and plan of the team.   Dorien Chihuahua B 11/24/2022, 5:15 PM

## 2022-11-24 NOTE — Progress Notes (Signed)
Occupational Therapy Session Note  Patient Details  Name: Dawn Moon MRN: [id number] Date of Birth: 03/21/1956  Today's Date: 11/24/2022 OT Individual Time: 6394-3200 OT Individual Time Calculation (min): 72 min    Short Term Goals: Week 1:  OT Short Term Goal 1 (Week 1): STG=LTG's d/t ELOS  Skilled Therapeutic Interventions/Progress Updates:    Pt received supine with 4/10 pain in her back described as aching but reporting this is managed to her, agreeable to OT session. Started session with shower. She transferred out of bed with mod I using bed rail. Discussed home set up and she will use a bed rail at home with her elevating head mattress. She completed clothing retrival from the dresser with excellent adherence to spinal precautions and mo I. She completed toileting tasks at mod I level from regular height toilet. She transferred into the shower and completed all bathing sit <> stand with mod I. She transferred out of shower and to a chair where she dressed with mod I. She donned her TLSO with min A overall. She completed 200 ft of functional mobility to the ADL apt with mod I where she practiced a bed transfer (flat, no rail), with mod I. Pt very excited to accomplish her goal! In the therapy gym, pt completed dynamic catching/throwing task to challenge dynamic balance and UE coordination. (S) overall for higher level challenges but no LOB. Pt returned to her room. Pt was left sitting up in the recliner with all needs met and call bell within reach.    Therapy Documentation Precautions:  Precautions Precautions: Fall, Back Precaution Booklet Issued: No Precaution Comments: Left 10th and right 4th rib fractures; no brace required for back, T3 fracture Required Braces or Orthoses: Spinal Brace Spinal Brace: Thoracolumbosacral orthotic (for comfort) Restrictions Weight Bearing Restrictions: No RLE Weight Bearing: Weight bearing as tolerated   Therapy/Group: Individual  Therapy  Curtis Sites 11/24/2022, 6:20 AM

## 2022-11-24 NOTE — Progress Notes (Signed)
Inpatient Rehabilitation Discharge Medication Review by a Pharmacist  A complete drug regimen review was completed for this patient to identify any potential clinically significant medication issues.  High Risk Drug Classes Is patient taking? Indication by Medication  Antipsychotic {Receiving?:26196}   Anticoagulant {Receiving?:26196}   Antibiotic {Receiving?:26196}   Opioid {Receiving?:26196} Oxycodone - PRN pain  Antiplatelet {Receiving?:26196}   Hypoglycemics/insulin {Yes or No?:26198}   Vasoactive Medication {Receiving?:26196} Metoprolol - HTN  Chemotherapy {Receiving Chemo?:26197}   Other {Yes or No?:26198} Acetaminophen - PRN pain Cellcept, Prednisone, Tacrolimus - immunosuppression for transplant Ezetimibe - HLD Lidocaine patch - PRN pain Melatonin - PRN sleep Omeprazole - GERD ppx Methocarbamol - muscle spasms Calcium carbonate, Vit D, MG + Protein - supplement     Type of Medication Issue Identified Description of Issue Recommendation(s)  Drug Interaction(s) (clinically significant)     Duplicate Therapy     Allergy     No Medication Administration End Date     Incorrect Dose     Additional Drug Therapy Needed     Significant med changes from prior encounter (inform family/care partners about these prior to discharge). Aspirin, promethazine, tramadol DCd Communicate medication changes with patient/family at discharge  Other       Clinically significant medication issues were identified that warrant physician communication and completion of prescribed/recommended actions by midnight of the next day:  {Yes or No?:26198}  Name of provider notified for urgent issues identified: ***   Provider Method of Notification: ***    Pharmacist comments: ***   Time spent performing this drug regimen review (minutes): 20   Thank you for allowing pharmacy to be a part of this patient's care.  Ardyth Harps, PharmD Clinical Pharmacist
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
## 2022-11-25 DIAGNOSIS — Z944 Liver transplant status: Secondary | ICD-10-CM | POA: Diagnosis not present

## 2022-11-25 DIAGNOSIS — I10 Essential (primary) hypertension: Secondary | ICD-10-CM | POA: Diagnosis not present

## 2022-11-25 LAB — CREATININE, SERUM
Creatinine, Ser: 0.95 mg/dL (ref 0.44–1.00)
GFR, Estimated: >60 mL/min (ref >60)
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
## 2022-11-25 NOTE — Progress Notes (Signed)
Patient discharged off of unit with all belongings. Discharge papers/instructions explained by physician assistant to family. Patient and family have no further questions at time of discharge. No complications noted at this time.  Dawn Moon  

## 2022-12-16 LAB — HEPATIC FUNCTION PANEL
ALT: 63 IU/L — ABNORMAL HIGH (ref 0–32)
AST: 39 IU/L (ref 0–40)
Albumin: 4.5 g/dL (ref 3.9–4.9)
Alkaline Phosphatase: 151 IU/L — ABNORMAL HIGH (ref 44–121)
Bilirubin Total: 0.4 mg/dL (ref 0.0–1.2)
Bilirubin, Direct: 0.15 mg/dL (ref 0.00–0.40)
Total Protein: 6.6 g/dL (ref 6.0–8.5)

## 2022-12-16 LAB — LIPID PANEL
Chol/HDL Ratio: 3.1 ratio (ref 0.0–4.4)
Cholesterol, Total: 183 mg/dL (ref 100–199)
HDL: 59 mg/dL (ref >39)
LDL Chol Calc (NIH): 81 mg/dL (ref 0–99)
Triglycerides: 265 mg/dL — ABNORMAL HIGH (ref 0–149)
VLDL Cholesterol Cal: 43 mg/dL — ABNORMAL HIGH (ref 5–40)

## 2022-12-25 ENCOUNTER — Encounter: Payer: Self-pay | Admitting: Cardiology

## 2023-03-26 ENCOUNTER — Telehealth: Payer: Self-pay | Admitting: *Deleted

## 2023-03-26 NOTE — Telephone Encounter (Signed)
Patient has 2 open orders for PUS, DX adnexal cyst placed 10/29/22, expected 10/30/23.   PUS completed on 10/29/22. Per review of OV notes.  Will check adnexal cyst annually with pelvic exam and pelvic US.  No intervention recommended at this time due to stability.  Duplicate PUS order canceled.   Routing to provider FYI.  Will close encounter.

## 2023-05-10 ENCOUNTER — Ambulatory Visit
Admission: RE | Admit: 2023-05-10 | Discharge: 2023-05-10 | Disposition: A | Discharge status: Home / Self Care | Payer: Medicare Other | Source: Ambulatory Visit | Attending: Family Medicine | Admitting: Family Medicine

## 2023-05-10 DIAGNOSIS — R921 Mammographic calcification found on diagnostic imaging of breast: Secondary | ICD-10-CM

## 2023-08-27 ENCOUNTER — Encounter: Payer: Self-pay | Admitting: Internal Medicine

## 2023-08-30 NOTE — Telephone Encounter (Signed)
Pt was contacted in regard to recent my chart messages and symptoms were discussed. Pt was scheduled for an office visit on 09/06/2023 at 11:00 with Doug Sou PA. Pt made aware. Pt verbalized understanding with all questions answered.

## 2023-09-06 ENCOUNTER — Other Ambulatory Visit: Payer: Medicare Other

## 2023-09-06 ENCOUNTER — Ambulatory Visit (INDEPENDENT_AMBULATORY_CARE_PROVIDER_SITE_OTHER): Payer: Medicare Other | Admitting: Gastroenterology

## 2023-09-06 ENCOUNTER — Encounter: Payer: Self-pay | Admitting: Gastroenterology

## 2023-09-06 VITALS — BP 124/80 | HR 68 | Ht 63.0 in | Wt 169.0 lb

## 2023-09-06 DIAGNOSIS — K529 Noninfective gastroenteritis and colitis, unspecified: Secondary | ICD-10-CM | POA: Insufficient documentation

## 2023-09-06 DIAGNOSIS — R197 Diarrhea, unspecified: Secondary | ICD-10-CM | POA: Insufficient documentation

## 2023-09-06 DIAGNOSIS — K580 Irritable bowel syndrome with diarrhea: Secondary | ICD-10-CM

## 2023-09-06 MED ORDER — RIFAXIMIN 550 MG PO TABS: 550.0000 mg | ORAL_TABLET | Freq: Three times a day (TID) | ORAL | 0 refills | Status: DC

## 2023-09-06 MED ORDER — DIPHENOXYLATE-ATROPINE 2.5-0.025 MG PO TABS: 1.0000 | ORAL_TABLET | Freq: Four times a day (QID) | ORAL | 3 refills | Status: DC | PRN

## 2023-09-06 NOTE — Progress Notes (Signed)
09/06/2023 Dawn Moon [id number] 03/01/1956   HISTORY OF PRESENT ILLNESS: This is a 67 year old female who is a patient of Dr. Marvell Fuller.  She has chronic diarrhea thought to be due to IBS-D.  She has been using Lomotil for her symptoms, but feels that has worsened a lot for the past several months.  Is having 15 or more watery bowel movements a day.  Is affecting her life and her ability to go places.  Having to wear depends.  She is not seeing any blood in her stool.  She does have intermittent sharp left lower quadrant abdominal pains, but they are not constant, they come and go.  She is on tacrolimus and CellCept for her liver transplant that was performed in June 2023.  She is asking about a FODMAP diet.  Take Lomotil and that does help some, but her PCP has been prescribing it does not give her a whole lot.  Colonoscopy December 2022 with some area of inflammation and an ulcerated area in the cecum, but biopsies did not show features consistent with IBD or microscopic colitis.  Suggested more so possibly NSAID induced or prep induced changes.  Past Medical History:  Diagnosis Date   Ascites    Dysrhythmia    hx of SVT   Encephalopathy, hepatic (HCC) 05/13/2018   GERD (gastroesophageal reflux disease)    History of exercise stress test    03-07-2010  Stress echo--- no arrhythmias or conduction abnormalilites and negative for ischemia or chest pain   History of kidney stones    History of paroxysmal supraventricular tachycardia    episode 2011  consult w/ dr Graciela Husbands --  put on atenolol--  per pt no longer an issue   HTN (hypertension)    cardiologist --  dr Jens Som   IBS (irritable bowel syndrome)    diarrhea   Nonalcoholic steatohepatitis (NASH)    Numbness and tingling of left lower extremity    post achilles tendon repair   OA (osteoarthritis)    knees   Osteoporosis    Pleural effusion on right    hepatic hydrothorax   PMB (postmenopausal bleeding) none  recent   thickened endometrium   PONV (postoperative nausea and vomiting)    Recurrent UTI none since 2019   Secondary esophageal varices without bleeding (HCC) 06/03/2018   Vitamin D deficiency    Wears glasses    Past Surgical History:  Procedure Laterality Date   ACHILLES TENDON SURGERY Left 06/2016   BIOPSY  11/25/2021   Procedure: BIOPSY;  Surgeon: Iva Boop, MD;  Location: [facility];  Service: Endoscopy;;   BREAST BIOPSY Left 02/2016   benign   CARDIAC CATHETERIZATION     COLONOSCOPY  2005   COLONOSCOPY WITH PROPOFOL N/A 11/25/2021   Procedure: COLONOSCOPY WITH PROPOFOL;  Surgeon: Iva Boop, MD;  Location: [facility];  Service: Endoscopy;  Laterality: N/A;   CT CTA CORONARY W/CA SCORE W/CM &/OR WO/CM  01/01/2014   non-obstructive calcified plaque in pLAD (0-25%), no significant incidental noncardiac findings noted   DILATATION & CURETTAGE/HYSTEROSCOPY WITH MYOSURE N/A 12/02/2020   Procedure: DILATATION & CURETTAGE/HYSTEROSCOPY WITH POLYPECTOMY AND REMOVAL OF VAGINAL LESION;  Surgeon: Theresia Majors, MD;  Location: [facility];  Service: Gynecology;  Laterality: N/A;  request to follow in [facility] iqueue held (DR. Penni Bombard has two other cases that morning starting 7:30am)   ESOPHAGOGASTRODUODENOSCOPY  01/2014   EXTRACORPOREAL SHOCK WAVE LITHOTRIPSY  2010   KNEE ARTHROPLASTY Right  03/24/2018   Procedure: RIGHT TOTAL KNEE ARTHROPLASTY WITH COMPUTER NAVIGATION;  Surgeon: Samson Frederic, MD;  Location: [facility];  Service: Orthopedics;  Laterality: Right;  Needs RNFA   KNEE ARTHROSCOPY Right 12/04/2016   Procedure: ARTHROSCOPY KNEE WITH PARTIAL MEDIAL MENISCECTOMY;  Surgeon: Samson Frederic, MD;  Location: [facility];  Service: Orthopedics;  Laterality: Right;   LEFT HEART CATH AND CORONARY ANGIOGRAPHY N/A 11/20/2021   Procedure: LEFT HEART CATH AND CORONARY ANGIOGRAPHY;  Surgeon: Kathleene Hazel, MD;  Location: [facility] INVASIVE CV  [facility];  Service: Cardiovascular;  Laterality: N/A;   LEFT HEART CATHETERIZATION WITH CORONARY ANGIOGRAM N/A 01/11/2012   Procedure: LEFT HEART CATHETERIZATION WITH CORONARY ANGIOGRAM;  Surgeon: Tonny Bollman, MD;  Location: [facility] CATH LAB;  Service: Cardiovascular;  Laterality: N/A;  widely patent coronary arterires without significant obstructive CAD,  normal LVF, ef 55-56%   LIVER TRANSPLANTATION  06/03/2022   TONSILLECTOMY AND ADENOIDECTOMY  child   TRANSTHORACIC ECHOCARDIOGRAM  01/18/2012   mild LVH,  ef 60%/  trivial TR   TUBAL LIGATION  1985   UPPER GASTROINTESTINAL ENDOSCOPY  last done 2019    reports that she has never smoked. She has never used smokeless tobacco. She reports that she does not currently use alcohol. She reports that she does not use drugs. family history includes Arthritis in her sister; Breast cancer in her maternal aunt; Celiac disease in her daughter; Coronary artery disease in her father and mother; Diabetes in her father; Healthy in her brother; Hypertension in her sister; IgA nephropathy in her daughter; Kidney failure in her daughter; Lymphoma in her mother. Allergies  Allergen Reactions   Atorvastatin Other (See Comments)    Muscle pain and cramps   Rosuvastatin Other (See Comments)    Muscle pain and cramps      Outpatient Encounter Medications as of 09/06/2023  Medication Sig   acetaminophen (TYLENOL) 325 MG tablet Take 650 mg by mouth 3 (three) times daily as needed for headache (and back pain).   Calcium Carbonate-Vit D-Min (CALTRATE 600+D PLUS MINERALS) 600-800 MG-UNIT TABS Take 2 tablets by mouth in the morning and at bedtime.   denosumab (PROLIA) 60 MG/ML SOSY injection    diphenoxylate-atropine (LOMOTIL) 2.5-0.025 MG tablet Take 1 tablet by mouth daily as needed for diarrhea or loose stools.   GNP MELATONIN 3 MG TABS tablet Take 6 mg by mouth at bedtime. 2 at bedtime   lactobacillus acidophilus (BACID) TABS tablet Take 2 tablets by mouth 3 (three) times  daily.   Lidocaine 4 % PTCH Apply 1 application  topically daily as needed (for back pain).   methocarbamol 1000 MG TABS Take 1,000 mg by mouth every 8 (eight) hours.   metoprolol tartrate (LOPRESSOR) 25 MG tablet Take 1 tablet (25 mg total) by mouth 2 (two) times daily.   mycophenolate (CELLCEPT) 250 MG capsule Take 1,000 mg by mouth 2 (two) times daily.   Omega-3 Fatty Acids (FISH OIL) 1000 MG CAPS Take by mouth.   OVER THE COUNTER MEDICATION Pt taking every day  tumeric biopherine garlic and ginger 2360 mg   oxyCODONE 10 MG TABS Take 1-1.5 tablets (10-15 mg total) by mouth every 4 (four) hours as needed for severe pain or moderate pain (5mg  moderate, 10mg  severe).   Specialty Vitamins Products (MG PLUS PROTEIN) 133 MG TABS Take 3 tablets by mouth in the morning and at bedtime.   tacrolimus (PROGRAF) 1 MG capsule Take 4 capsules (4 mg total) by mouth every 12 (twelve) hours. (  Patient taking differently: Take 4 mg by mouth every 12 (twelve) hours. Pt taking 3 capsules 3 mg a day)   Cholecalciferol (VITAMIN D3 PO) Take 1 capsule by mouth daily. (Patient not taking: Reported on 09/06/2023)   ezetimibe (ZETIA) 10 MG tablet Take 1 tablet (10 mg total) by mouth daily. (Patient not taking: Reported on 09/06/2023)   omeprazole (PRILOSEC) 20 MG capsule Take 20 mg by mouth daily. (Patient not taking: Reported on 09/06/2023)   predniSONE (DELTASONE) 5 MG tablet Take 5 mg by mouth daily with breakfast. (Patient not taking: Reported on 09/06/2023)   No facility-administered encounter medications on file as of 09/06/2023.    REVIEW OF SYSTEMS  : All other systems reviewed and negative except where noted in the History of Present Illness.   PHYSICAL EXAM: BP 124/80   Pulse 68   Ht 5\' 3"  (1.6 m)   Wt 169 lb (76.7 kg)   BMI 29.94 kg/m  General: Well developed white female in no acute distress Head: Normocephalic and atraumatic Eyes:  Sclerae anicteric, conjunctiva pink. Ears: Normal auditory  acuity Lungs: Clear throughout to auscultation; no W/R/R. Heart: Regular rate and rhythm; no M/R/G. Abdomen: Soft, non-distended.  BS present.  Non-tender. Musculoskeletal: Symmetrical with no gross deformities  Skin: No lesions on visible extremities Extremities: No edema  Neurological: Alert oriented x 4, grossly non-focal Psychological:  Alert and cooperative. Normal mood and affect  ASSESSMENT AND PLAN: *Acute on chronic diarrhea: Has chronic IBS-D, but she reports over the past several months has had worsening of her diarrhea.  She is on immunosuppressants so need to be sure no infectious source.  Will check a GI pathogen panel.  The CellCept and tacrolimus technically can cause diarrhea as side effects as well which maybe has caused her worsening.  She talked about the FODMAP diet we can certainly send her to see a nutritionist/dietitian to review that further and maybe get her some meal plans together at her request.  We can try for Xifaxan 550 mg 3 times daily for 2 weeks as it looks like it is preferred Level One on her insurance to see if that would help.  Prescription sent to pharmacy.  Otherwise can continue her Lomotil and I will send a new prescription for that as well. *Status post liver transplant June 2023 for NASH cirrhosis:  On cellcept and tacrolimus.   CC:  April Manson, NP

## 2023-09-06 NOTE — Patient Instructions (Signed)
Your provider has requested that you go to the basement level for lab work before leaving today. Press "B" on the elevator. The lab is located at the first door on the left as you exit the elevator.  We have sent the following medications to your pharmacy for you to pick up at your convenience: Xifaxan 550 mg three times daily for 14 days.  Lomotil 1 tablet every 6 hours as needed.  Referral placed for dietician.   _______________________________________________________  If your blood pressure at your visit was 140/90 or greater, please contact your primary care physician to follow up on this.  _______________________________________________________  If you are age 67 or older, your body mass index should be between 23-30. Your Body mass index is 29.94 kg/m. If this is out of the aforementioned range listed, please consider follow up with your Primary Care Provider.  If you are age 35 or younger, your body mass index should be between 19-25. Your Body mass index is 29.94 kg/m. If this is out of the aformentioned range listed, please consider follow up with your Primary Care Provider.   ________________________________________________________  The [facility] providers would like to encourage you to use [facility] to communicate with providers for non-urgent requests or questions.  Due to long hold times on the telephone, sending your provider a message by [facility] may be a faster and more efficient way to get a response.  Please allow 48 business hours for a response.  Please remember that this is for non-urgent requests.  _______________________________________________________

## 2023-09-08 ENCOUNTER — Other Ambulatory Visit: Payer: Medicare Other

## 2023-09-08 DIAGNOSIS — R197 Diarrhea, unspecified: Secondary | ICD-10-CM

## 2023-09-09 ENCOUNTER — Telehealth: Payer: Self-pay | Admitting: Internal Medicine

## 2023-09-09 NOTE — Telephone Encounter (Signed)
Spoke to her  I wonder CellCept as a cause for diarrhea though does have hx IBS  Has had to reduce Cellcept due to leukopenia now on 250 bid) since lastweek  Will see how it goes and await stool test.   Waiting to hear on PA for Xifaxan x 2 weeks she says

## 2023-09-10 LAB — GI PROFILE, STOOL, PCR

## 2023-09-17 ENCOUNTER — Telehealth: Payer: Self-pay | Admitting: Pharmacy Technician

## 2023-09-17 ENCOUNTER — Other Ambulatory Visit (HOSPITAL_COMMUNITY): Payer: Self-pay

## 2023-09-17 NOTE — Telephone Encounter (Signed)
Pharmacy Patient Advocate Encounter   Received notification from CoverMyMeds that prior authorization for XIFAXAN 550MG  is required/requested.   Insurance verification completed.   The patient is insured through [facility] .   Per test claim: PA required; PA submitted to [facility] via CoverMyMeds Key/confirmation #/EOC [facility] Status is pending

## 2023-09-17 NOTE — Telephone Encounter (Signed)
Pharmacy Patient Advocate Encounter  Received notification from [facility] that Prior Authorization for XIFAXAN 550MG  has been APPROVED from 9.13.24 to 10.11.24. Ran test claim, Copay is $889.85. This test claim was processed through [facility]- copay amounts may vary at other pharmacies due to pharmacy/plan contracts, or as the patient moves through the different stages of their insurance plan.   PA #/Case ID/Reference #: : [id number]  This medication is a Specialty Tier 5 medication AND patient has an unmet deductible of ~$1500 that has resulted in a high copay for this patient.

## 2023-09-17 NOTE — Telephone Encounter (Signed)
Dr Leone Payor were you waiting for the PA decision for this pt xifaxan?  Her copay is $1500

## 2023-09-17 NOTE — Telephone Encounter (Signed)
Dawn Moon ordered that.  It sounds like it is going to be too expensive.  Please check with the patient what she thinks about that and get an update on her diarrhea and we will determine next steps.

## 2023-09-20 NOTE — Telephone Encounter (Signed)
The pt has been advised that the insurance copay is too expensive for Xifaxan.  She does continue to have diarrhea- this has not gotten any better but is no worse.  She does mention that she was able to get it from [place] last year and would like to know if that is an option.  Please advice She is aware that Shanda Bumps is out today and will return tomorrow.

## 2023-09-20 NOTE — Telephone Encounter (Signed)
Please see notes below.

## 2023-09-21 ENCOUNTER — Other Ambulatory Visit (HOSPITAL_COMMUNITY): Payer: Self-pay

## 2023-09-21 NOTE — Telephone Encounter (Signed)
The pt stated that she has the information I can get it from her if you are ok with it.

## 2023-09-21 NOTE — Telephone Encounter (Signed)
PA was filled on 9.23.24 at retail pharmacy. Not sure what price was.

## 2023-09-21 NOTE — Telephone Encounter (Signed)
Left message on machine to call back  

## 2023-09-22 ENCOUNTER — Other Ambulatory Visit: Payer: Self-pay

## 2023-09-22 MED ORDER — RIFAXIMIN 550 MG PO TABS: 550.0000 mg | ORAL_TABLET | Freq: Three times a day (TID) | ORAL | 0 refills | Status: DC

## 2023-09-22 NOTE — Telephone Encounter (Signed)
I spoke with the pt and she provided the name and fax number to the [place] pharmacy.    [facility] [phone]  I have faxed today

## 2023-09-22 NOTE — Telephone Encounter (Signed)
The [facility] at phone # [phone] faxed [place] a form that I had Dr Leone Payor sign for the rifaximin 550mg : one tablet by mouth TID #100 with one RF.  I have faxed that to fax # [phone].

## 2023-10-13 ENCOUNTER — Ambulatory Visit: Payer: Medicare Other | Admitting: Internal Medicine

## 2023-10-24 ENCOUNTER — Emergency Department (HOSPITAL_COMMUNITY): Payer: Medicare Other

## 2023-10-24 ENCOUNTER — Emergency Department (HOSPITAL_BASED_OUTPATIENT_CLINIC_OR_DEPARTMENT_OTHER): Payer: Medicare Other

## 2023-10-24 ENCOUNTER — Inpatient Hospital Stay (HOSPITAL_COMMUNITY): Payer: Medicare Other

## 2023-10-24 ENCOUNTER — Observation Stay (HOSPITAL_BASED_OUTPATIENT_CLINIC_OR_DEPARTMENT_OTHER)
Admission: EM | Admit: 2023-10-24 | Discharge: 2023-10-25 | Disposition: A | Discharge status: Home / Self Care | Payer: Medicare Other | Attending: Internal Medicine | Admitting: Internal Medicine

## 2023-10-24 ENCOUNTER — Encounter (HOSPITAL_BASED_OUTPATIENT_CLINIC_OR_DEPARTMENT_OTHER): Payer: Self-pay

## 2023-10-24 ENCOUNTER — Encounter: Payer: Self-pay | Admitting: Cardiology

## 2023-10-24 DIAGNOSIS — I10 Essential (primary) hypertension: Secondary | ICD-10-CM

## 2023-10-24 DIAGNOSIS — I129 Hypertensive chronic kidney disease with stage 1 through stage 4 chronic kidney disease, or unspecified chronic kidney disease: Secondary | ICD-10-CM | POA: Insufficient documentation

## 2023-10-24 DIAGNOSIS — R4781 Slurred speech: Secondary | ICD-10-CM | POA: Diagnosis not present

## 2023-10-24 DIAGNOSIS — I639 Cerebral infarction, unspecified: Secondary | ICD-10-CM | POA: Diagnosis not present

## 2023-10-24 DIAGNOSIS — I6389 Other cerebral infarction: Secondary | ICD-10-CM

## 2023-10-24 DIAGNOSIS — R29810 Facial weakness: Secondary | ICD-10-CM | POA: Diagnosis present

## 2023-10-24 DIAGNOSIS — Z944 Liver transplant status: Secondary | ICD-10-CM | POA: Insufficient documentation

## 2023-10-24 DIAGNOSIS — D849 Immunodeficiency, unspecified: Secondary | ICD-10-CM | POA: Diagnosis not present

## 2023-10-24 DIAGNOSIS — Z79899 Other long term (current) drug therapy: Secondary | ICD-10-CM | POA: Diagnosis not present

## 2023-10-24 DIAGNOSIS — Z96651 Presence of right artificial knee joint: Secondary | ICD-10-CM | POA: Diagnosis not present

## 2023-10-24 DIAGNOSIS — R2990 Unspecified symptoms and signs involving the nervous system: Principal | ICD-10-CM

## 2023-10-24 DIAGNOSIS — I6381 Other cerebral infarction due to occlusion or stenosis of small artery: Secondary | ICD-10-CM | POA: Diagnosis not present

## 2023-10-24 DIAGNOSIS — Z7982 Long term (current) use of aspirin: Secondary | ICD-10-CM | POA: Diagnosis not present

## 2023-10-24 DIAGNOSIS — N189 Chronic kidney disease, unspecified: Secondary | ICD-10-CM | POA: Insufficient documentation

## 2023-10-24 LAB — COMPREHENSIVE METABOLIC PANEL
ALT: 13 U/L (ref 0–44)
AST: 18 U/L (ref 15–41)
Albumin: 4.6 g/dL (ref 3.5–5.0)
Alkaline Phosphatase: 45 U/L (ref 38–126)
Anion gap: 9 (ref 5–15)
BUN: 28 mg/dL — ABNORMAL HIGH (ref 8–23)
CO2: 26 mmol/L (ref 22–32)
Calcium: 9.8 mg/dL (ref 8.9–10.3)
Chloride: 103 mmol/L (ref 98–111)
Creatinine, Ser: 1.32 mg/dL — ABNORMAL HIGH (ref 0.44–1.00)
GFR, Estimated: 44 mL/min — ABNORMAL LOW (ref >60)
Glucose, Bld: 89 mg/dL (ref 70–99)
Potassium: 4.1 mmol/L (ref 3.5–5.1)
Sodium: 138 mmol/L (ref 135–145)
Total Bilirubin: 0.8 mg/dL (ref 0.3–1.2)
Total Protein: 7 g/dL (ref 6.5–8.1)

## 2023-10-24 LAB — CBC
HCT: 38.5 % (ref 36.0–46.0)
Hemoglobin: 12.5 g/dL (ref 12.0–15.0)
MCH: 25.4 pg — ABNORMAL LOW (ref 26.0–34.0)
MCHC: 32.5 g/dL (ref 30.0–36.0)
MCV: 78.3 fL — ABNORMAL LOW (ref 80.0–100.0)
Platelets: 199 10*3/uL (ref 150–400)
RBC: 4.92 MIL/uL (ref 3.87–5.11)
RDW: 14.3 % (ref 11.5–15.5)
WBC: 4.4 10*3/uL (ref 4.0–10.5)
nRBC: 0 % (ref 0.0–0.2)

## 2023-10-24 LAB — DIFFERENTIAL
Abs Immature Granulocytes: 0.02 10*3/uL (ref 0.00–0.07)
Basophils Absolute: 0 10*3/uL (ref 0.0–0.1)
Basophils Relative: 1 %
Eosinophils Absolute: 0.2 10*3/uL (ref 0.0–0.5)
Eosinophils Relative: 4 %
Immature Granulocytes: 1 %
Lymphocytes Relative: 31 %
Lymphs Abs: 1.4 10*3/uL (ref 0.7–4.0)
Monocytes Absolute: 0.4 10*3/uL (ref 0.1–1.0)
Monocytes Relative: 10 %
Neutro Abs: 2.4 10*3/uL (ref 1.7–7.7)
Neutrophils Relative %: 53 %

## 2023-10-24 LAB — URINALYSIS, ROUTINE W REFLEX MICROSCOPIC
Glucose, UA: NEGATIVE mg/dL
Hgb urine dipstick: NEGATIVE
Ketones, ur: NEGATIVE mg/dL
Nitrite: NEGATIVE
Protein, ur: NEGATIVE mg/dL
Specific Gravity, Urine: 1.03 (ref 1.005–1.030)
pH: 5.5 (ref 5.0–8.0)

## 2023-10-24 LAB — URINALYSIS, MICROSCOPIC (REFLEX): RBC / HPF: NONE SEEN RBC/hpf (ref 0–5)

## 2023-10-24 LAB — ETHANOL: Alcohol, Ethyl (B): <10 mg/dL (ref <10)

## 2023-10-24 LAB — PROTIME-INR
INR: 0.9 (ref 0.8–1.2)
Prothrombin Time: 12.4 s (ref 11.4–15.2)

## 2023-10-24 LAB — APTT: aPTT: 27 s (ref 24–36)

## 2023-10-24 MED ORDER — ASPIRIN 81 MG PO TBEC
81.0000 mg | DELAYED_RELEASE_TABLET | Freq: Every day | ORAL | Status: DC
  Administered 2023-10-25: 81 mg via ORAL
  Filled 2023-10-24: qty 1

## 2023-10-24 MED ORDER — DIPHENOXYLATE-ATROPINE 2.5-0.025 MG PO TABS: 1.0000 | ORAL_TABLET | Freq: Four times a day (QID) | ORAL | Status: DC | PRN

## 2023-10-24 MED ORDER — TACROLIMUS 1 MG PO CAPS
3.0000 mg | ORAL_CAPSULE | Freq: Every morning | ORAL | Status: DC
  Administered 2023-10-25: 3 mg via ORAL
  Filled 2023-10-24 (×2): qty 3

## 2023-10-24 MED ORDER — TACROLIMUS 1 MG PO CAPS
2.0000 mg | ORAL_CAPSULE | Freq: Every day | ORAL | Status: DC
  Administered 2023-10-24: 2 mg via ORAL
  Filled 2023-10-24: qty 2

## 2023-10-24 MED ORDER — LORAZEPAM 0.5 MG PO TABS: 0.5000 mg | ORAL_TABLET | ORAL | Status: DC | PRN

## 2023-10-24 MED ORDER — MYCOPHENOLATE MOFETIL 250 MG PO CAPS
250.0000 mg | ORAL_CAPSULE | Freq: Two times a day (BID) | ORAL | Status: DC
  Administered 2023-10-24 – 2023-10-25 (×2): 250 mg via ORAL
  Filled 2023-10-24 (×2): qty 1

## 2023-10-24 MED ORDER — ONDANSETRON HCL 4 MG PO TABS: 4.0000 mg | ORAL_TABLET | Freq: Four times a day (QID) | ORAL | Status: DC | PRN

## 2023-10-24 MED ORDER — SODIUM CHLORIDE 0.9% FLUSH: 3.0000 mL | Freq: Once | INTRAVENOUS | Status: DC

## 2023-10-24 MED ORDER — TACROLIMUS 1 MG PO CAPS: 3.0000 mg | ORAL_CAPSULE | Freq: Two times a day (BID) | ORAL | Status: DC

## 2023-10-24 MED ORDER — ACETAMINOPHEN 650 MG RE SUPP: 650.0000 mg | Freq: Four times a day (QID) | RECTAL | Status: DC | PRN

## 2023-10-24 MED ORDER — SODIUM CHLORIDE 0.9 % IV BOLUS: 1000.0000 mL | Freq: Once | INTRAVENOUS | Status: DC

## 2023-10-24 MED ORDER — ACETAMINOPHEN 500 MG PO TABS: 500.0000 mg | ORAL_TABLET | Freq: Three times a day (TID) | ORAL | Status: DC | PRN

## 2023-10-24 MED ORDER — RISAQUAD PO CAPS
1.0000 | ORAL_CAPSULE | Freq: Three times a day (TID) | ORAL | Status: DC
Start: 2023-10-24 — End: 2023-10-25
  Administered 2023-10-24 – 2023-10-25 (×2): 1 via ORAL
  Filled 2023-10-24 (×5): qty 1

## 2023-10-24 MED ORDER — ONDANSETRON HCL 4 MG/2ML IJ SOLN: 4.0000 mg | Freq: Four times a day (QID) | INTRAMUSCULAR | Status: DC | PRN

## 2023-10-24 MED ORDER — METOPROLOL TARTRATE 25 MG PO TABS
25.0000 mg | ORAL_TABLET | Freq: Two times a day (BID) | ORAL | Status: DC
  Administered 2023-10-24 – 2023-10-25 (×2): 25 mg via ORAL
  Filled 2023-10-24 (×2): qty 1

## 2023-10-24 MED ORDER — MELATONIN 3 MG PO TABS
6.0000 mg | ORAL_TABLET | Freq: Every day | ORAL | Status: DC
  Administered 2023-10-24: 6 mg via ORAL
  Filled 2023-10-24: qty 2

## 2023-10-24 MED ORDER — ENOXAPARIN SODIUM 40 MG/0.4ML IJ SOSY
40.0000 mg | PREFILLED_SYRINGE | INTRAMUSCULAR | Status: DC
Start: 2023-10-24 — End: 2023-10-25
  Administered 2023-10-24: 40 mg via SUBCUTANEOUS
  Filled 2023-10-24: qty 0.4

## 2023-10-24 MED ORDER — DIPHENHYDRAMINE-APAP (SLEEP) 25-500 MG PO TABS: 1.0000 | ORAL_TABLET | Freq: Every evening | ORAL | Status: DC | PRN

## 2023-10-24 NOTE — ED Notes (Signed)
Pt ambulatory with steady gait, standby assist to restroom. 

## 2023-10-24 NOTE — ED Notes (Signed)
ED TO INPATIENT HANDOFF REPORT  ED Nurse Name and Phone #: Joneen Boers, Paramedic / [phone]  S Name/Age/Gender Dawn Moon 67 y.o. female Room/Bed: H016C/H016C  Code Status   Code Status: Prior  Home/SNF/Other Home Patient oriented to: self, place, time, and situation Is this baseline? Yes   Triage Complete: Triage complete  Chief Complaint Stroke [facility]) [I63.9]  Triage Note She c/o left-sided lower facial droop with some dysarthria x 2 days. Her husband is with her. She is ambulatory and in no distress.   Allergies Allergies  Allergen Reactions   Atorvastatin Other (See Comments)    Muscle pain and cramps   Rosuvastatin Other (See Comments)    Muscle pain and cramps    Level of Care/Admitting Diagnosis ED Disposition     ED Disposition  Admit   Condition  --   Comment  Hospital Area: [facility] [[id number]]  Level of Care: Telemetry Medical [104]  May admit patient to [facility] or [facility] if equivalent level of care is available:: No  Covid Evaluation: Asymptomatic - no recent exposure (last 10 days) testing not required  Diagnosis: Stroke [facility]) [[id number]]  Admitting Physician: Buena Irish [[id number]]  Attending Physician: Buena Irish [[id number]]  Certification:: I certify this patient will need inpatient services for at least 2 midnights  Expected Medical Readiness: 10/26/2023          B Medical/Surgery History Past Medical History:  Diagnosis Date   Ascites    Dysrhythmia    hx of SVT   Encephalopathy, hepatic (HCC) 05/13/2018   GERD (gastroesophageal reflux disease)    History of exercise stress test    03-07-2010  Stress echo--- no arrhythmias or conduction abnormalilites and negative for ischemia or chest pain   History of kidney stones    History of paroxysmal supraventricular tachycardia    episode 2011  consult w/ dr Graciela Husbands --  put on atenolol--  per pt no longer an issue   HTN (hypertension)     cardiologist --  dr Jens Som   IBS (irritable bowel syndrome)    diarrhea   Nonalcoholic steatohepatitis (NASH)    Numbness and tingling of left lower extremity    post achilles tendon repair   OA (osteoarthritis)    knees   Osteoporosis    Pleural effusion on right    hepatic hydrothorax   PMB (postmenopausal bleeding) none recent   thickened endometrium   PONV (postoperative nausea and vomiting)    Recurrent UTI none since 2019   Secondary esophageal varices without bleeding (HCC) 06/03/2018   Vitamin D deficiency    Wears glasses    Past Surgical History:  Procedure Laterality Date   ACHILLES TENDON SURGERY Left 06/2016   BIOPSY  11/25/2021   Procedure: BIOPSY;  Surgeon: Iva Boop, MD;  Location: [facility];  Service: Endoscopy;;   BREAST BIOPSY Left 02/2016   benign   CARDIAC CATHETERIZATION     COLONOSCOPY  2005   COLONOSCOPY WITH PROPOFOL N/A 11/25/2021   Procedure: COLONOSCOPY WITH PROPOFOL;  Surgeon: Iva Boop, MD;  Location: [facility];  Service: Endoscopy;  Laterality: N/A;   CT CTA CORONARY W/CA SCORE W/CM &/OR WO/CM  01/01/2014   non-obstructive calcified plaque in pLAD (0-25%), no significant incidental noncardiac findings noted   DILATATION & CURETTAGE/HYSTEROSCOPY WITH MYOSURE N/A 12/02/2020   Procedure: DILATATION & CURETTAGE/HYSTEROSCOPY WITH POLYPECTOMY AND REMOVAL OF VAGINAL LESION;  Surgeon: Theresia Majors, MD;  Location: [facility];  Service:  Gynecology;  Laterality: N/A;  request to follow in [facility] iqueue held (DR. Penni Bombard has two other cases that morning starting 7:30am)   ESOPHAGOGASTRODUODENOSCOPY  01/2014   EXTRACORPOREAL SHOCK WAVE LITHOTRIPSY  2010   KNEE ARTHROPLASTY Right 03/24/2018   Procedure: RIGHT TOTAL KNEE ARTHROPLASTY WITH COMPUTER NAVIGATION;  Surgeon: Samson Frederic, MD;  Location: [facility];  Service: Orthopedics;  Laterality: Right;  Needs RNFA   KNEE ARTHROSCOPY Right 12/04/2016   Procedure:  ARTHROSCOPY KNEE WITH PARTIAL MEDIAL MENISCECTOMY;  Surgeon: Samson Frederic, MD;  Location: [facility];  Service: Orthopedics;  Laterality: Right;   LEFT HEART CATH AND CORONARY ANGIOGRAPHY N/A 11/20/2021   Procedure: LEFT HEART CATH AND CORONARY ANGIOGRAPHY;  Surgeon: Kathleene Hazel, MD;  Location: [facility]VAS[facility] CV [facility];  Service: Cardiovascular;  Laterality: N/A;   LEFT HEART CATHETERIZATION WITH CORONARY ANGIOGRAM N/A 01/11/2012   Procedure: LEFT HEART CATHETERIZATION WITH CORONARY ANGIOGRAM;  Surgeon: Tonny Bollman, MD;  Location: [facility] CATH LAB;  Service: Cardiovascular;  Laterality: N/A;  widely patent coronary arterires without significant obstructive CAD,  normal LVF, ef 55-56%   LIVER TRANSPLANTATION  06/03/2022   TONSILLECTOMY AND ADENOIDECTOMY  child   TRANSTHORACIC ECHOCARDIOGRAM  01/18/2012   mild LVH,  ef 60%/  trivial TR   TUBAL LIGATION  1985   UPPER GASTROINTESTINAL ENDOSCOPY  last done 2019     A IV Location/Drains/Wounds Patient Lines/Drains/Airways Status     Active Line/Drains/Airways     Name Placement date Placement time Site Days   Peripheral IV 10/24/23 20 G 1" Right;Posterior Forearm 10/24/23  1004  Forearm  less than 1            Intake/Output Last 24 hours No intake or output data in the 24 hours ending 10/24/23 1849  Labs/Imaging Results for orders placed or performed during the hospital encounter of 10/24/23 (from the past 48 hour(s))  Ethanol     Status: None   Collection Time: 10/24/23  9:58 AM  Result Value Ref Range   Alcohol, Ethyl (B) <10 <10 mg/dL    Comment: (NOTE) Lowest detectable limit for serum alcohol is 10 mg/dL.  For medical purposes only. Performed at [facility], [address], [place], [place] [address]   Protime-INR     Status: None   Collection Time: 10/24/23  9:58 AM  Result Value Ref Range   Prothrombin Time 12.4 11.4 - 15.2 seconds   INR 0.9 0.8 - 1.2    Comment: (NOTE) INR  goal varies based on device and disease states. Performed at [facility], [address] [place], [place] [address]   APTT     Status: None   Collection Time: 10/24/23  9:58 AM  Result Value Ref Range   aPTT 27 24 - 36 seconds    Comment: Performed at [facility], [address] [place], [place], [place] [address]  CBC     Status: Abnormal   Collection Time: 10/24/23  9:58 AM  Result Value Ref Range   WBC 4.4 4.0 - 10.5 K/uL   RBC 4.92 3.87 - 5.11 MIL/uL   Hemoglobin 12.5 12.0 - 15.0 g/dL   HCT 91.4 78.2 - 95.6 %   MCV 78.3 (L) 80.0 - 100.0 fL   MCH 25.4 (L) 26.0 - 34.0 pg   MCHC 32.5 30.0 - 36.0 g/dL   RDW 21.3 08.6 - 57.8 %   Platelets 199 150 - 400 K/uL   nRBC 0.0 0.0 - 0.2 %  Comment: Performed at [facility], [address], [place], [place] [address]  Differential     Status: None   Collection Time: 10/24/23  9:58 AM  Result Value Ref Range   Neutrophils Relative % 53 %   Neutro Abs 2.4 1.7 - 7.7 K/uL   Lymphocytes Relative 31 %   Lymphs Abs 1.4 0.7 - 4.0 K/uL   Monocytes Relative 10 %   Monocytes Absolute 0.4 0.1 - 1.0 K/uL   Eosinophils Relative 4 %   Eosinophils Absolute 0.2 0.0 - 0.5 K/uL   Basophils Relative 1 %   Basophils Absolute 0.0 0.0 - 0.1 K/uL   Immature Granulocytes 1 %   Abs Immature Granulocytes 0.02 0.00 - 0.07 K/uL    Comment: Performed at [facility], [address] [place], [place] [address]  Comprehensive metabolic panel     Status: Abnormal   Collection Time: 10/24/23  9:58 AM  Result Value Ref Range   Sodium 138 135 - 145 mmol/L   Potassium 4.1 3.5 - 5.1 mmol/L   Chloride 103 98 - 111 mmol/L   CO2 26 22 - 32 mmol/L   Glucose, Bld 89 70 - 99 mg/dL    Comment: Glucose reference range applies only to samples taken after fasting for at least 8 hours.   BUN 28 (H) 8 - 23 mg/dL   Creatinine, Ser 0.98 (H) 0.44 - 1.00 mg/dL   Calcium 9.8 8.9 - 11.9 mg/dL   Total  Protein 7.0 6.5 - 8.1 g/dL   Albumin 4.6 3.5 - 5.0 g/dL   AST 18 15 - 41 U/L   ALT 13 0 - 44 U/L   Alkaline Phosphatase 45 38 - 126 U/L   Total Bilirubin 0.8 0.3 - 1.2 mg/dL   GFR, Estimated 44 (L) >60 mL/min    Comment: (NOTE) Calculated using the CKD-EPI Creatinine Equation (2021)    Anion gap 9 5 - 15    Comment: Performed at [facility], [address], [place], [place] [address]  Urinalysis, Routine w reflex microscopic -Urine, Clean Catch     Status: Abnormal   Collection Time: 10/24/23  9:58 AM  Result Value Ref Range   Color, Urine YELLOW YELLOW   APPearance HAZY (A) CLEAR   Specific Gravity, Urine 1.030 1.005 - 1.030   pH 5.5 5.0 - 8.0   Glucose, UA NEGATIVE NEGATIVE mg/dL   Hgb urine dipstick NEGATIVE NEGATIVE   Bilirubin Urine SMALL (A) NEGATIVE   Ketones, ur NEGATIVE NEGATIVE mg/dL   Protein, ur NEGATIVE NEGATIVE mg/dL   Nitrite NEGATIVE NEGATIVE   Leukocytes,Ua TRACE (A) NEGATIVE    Comment: Performed at [facility], [address] [place], [place] [address]  Urinalysis, Microscopic (reflex)     Status: Abnormal   Collection Time: 10/24/23  9:58 AM  Result Value Ref Range   RBC / HPF NONE SEEN 0 - 5 RBC/hpf   WBC, UA 0-5 0 - 5 WBC/hpf   Bacteria, UA FEW (A) NONE SEEN   Squamous Epithelial / HPF 0-5 0 - 5 /HPF   Non Squamous Epithelial PRESENT (A) NONE SEEN   Ca Oxalate Crys, UA PRESENT    Urine-Other LESS THAN 10 mL OF URINE SUBMITTED     Comment: Performed at [facility], [address] [place], [place] [address]   MR BRAIN WO CONTRAST  Result Date: 10/24/2023 CLINICAL DATA:  Neuro deficit, stroke suspected. Left facial droop with dysarthria. EXAM: MRI HEAD WITHOUT CONTRAST  TECHNIQUE: Multiplanar, multiecho pulse sequences of the brain and surrounding structures were obtained without intravenous contrast. COMPARISON:  Same-day CT head FINDINGS: Brain: There is a small acute to early subacute  infarct in the right corona radiata without hemorrhage or mass effect. There is no acute intracranial hemorrhage or extra-axial fluid collection. Background parenchymal volume is normal. The ventricles are normal in size. There are additional small remote infarcts in the bilateral parietal white matter and background chronic small-vessel ischemic change The pituitary and suprasellar region are normal. There is no mass lesion. There is no mass effect or midline shift. Vascular: Normal flow voids. Skull and upper cervical spine: Normal marrow signal. Sinuses/Orbits: The paranasal sinuses are clear. The globes and orbits are unremarkable. Other: The mastoid air cells and middle ear cavities are clear. IMPRESSION: 1. Acute to early subacute infarct in the right corona radiata in the MCA distribution without hemorrhage or mass effect. 2. Additional small remote infarcts and background chronic small-vessel ischemic change as above. Electronically Signed   By: Lesia Hausen M.D.   On: 10/24/2023 17:53   CT HEAD WO CONTRAST  Result Date: 10/24/2023 CLINICAL DATA:  Left-sided lower facial droop with some dysarthria for 2 days. EXAM: CT HEAD WITHOUT CONTRAST TECHNIQUE: Contiguous axial images were obtained from the base of the skull through the vertex without intravenous contrast. RADIATION DOSE REDUCTION: This exam was performed according to the departmental dose-optimization program which includes automated exposure control, adjustment of the mA and/or kV according to patient size and/or use of iterative reconstruction technique. COMPARISON:  Head CT 11/11/2022 FINDINGS: Brain: No evidence of acute infarction, hemorrhage, hydrocephalus, extra-axial collection or mass lesion/mass effect. Low densities in the cerebral white matter attributed to chronic small vessel disease with fairly defined and chronic appearing lacune so. There has been progression from 2023. Vascular: No hyperdense vessel or unexpected calcification.  Skull: Normal. Negative for fracture or focal lesion. Sinuses/Orbits: No acute finding. IMPRESSION: 1. No acute finding. 2. Chronic small vessel disease with progression from 2023. Electronically Signed   By: Tiburcio Pea M.D.   On: 10/24/2023 10:42    Pending Labs Unresulted Labs (From admission, onward)     Start     Ordered   10/24/23 0981  Urine rapid drug screen (hosp performed)  Once,   STAT        10/24/23 0922            Vitals/Pain Today's Vitals   10/24/23 1130 10/24/23 1245 10/24/23 1404 10/24/23 1718  BP: 136/82   (!) 163/80  Pulse: 62   72  Resp: 15   16  Temp:    98 F (36.7 C)  TempSrc:    Oral  SpO2: 95%   98%  PainSc:  0-No pain 0-No pain 0-No pain    Isolation Precautions No active isolations  Medications Medications  LORazepam (ATIVAN) tablet 0.5 mg (has no administration in time range)  sodium chloride flush (NS) 0.9 % injection 3 mL (has no administration in time range)    Mobility walks     Focused Assessments Neuro Assessment Handoff:  Swallow screen pass? Yes  Cardiac Rhythm: Normal sinus rhythm NIH Stroke Scale  Dizziness Present: No Headache Present: No Interval: Shift assessment Level of Consciousness (1a.)   : Alert, keenly responsive LOC Questions (1b. )   : Answers both questions correctly LOC Commands (1c. )   : Performs both tasks correctly Best Gaze (2. )  : Normal Visual (3. )  : No  visual loss Facial Palsy (4. )    : Minor paralysis Motor Arm, Left (5a. )   : No drift Motor Arm, Right (5b. ) : No drift Motor Leg, Left (6a. )  : No drift Motor Leg, Right (6b. ) : No drift Limb Ataxia (7. ): Absent Sensory (8. )  : Normal, no sensory loss Best Language (9. )  : No aphasia Dysarthria (10. ): Normal Extinction/Inattention (11.)   : No Abnormality Complete NIHSS TOTAL: 1 Last date known well: 10/22/23 Last time known well:  (Not sure) Neuro Assessment: Exceptions to [facility] Neuro Checks:   Initial (10/24/23 1034)  Has  TPA been given? No If patient is a Neuro Trauma and patient is going to OR before floor call report to 4N Charge nurse: [phone] or [phone]   R Recommendations: See Admitting Provider Note  Report given to:   Additional Notes: Family at bedside

## 2023-10-24 NOTE — H&P (Signed)
History and Physical    Patient: Dawn Moon NWG:[id number] DOB: 07/26/1956 DOA: 10/24/2023 DOS: the patient was seen and examined on 10/24/2023 PCP: April Manson, NP  Patient coming from: Home  Chief Complaint:  Chief Complaint  Patient presents with   Facial Droop   HPI: Dawn Moon is a 67 y.o. female with medical history significant for liver transplant 05/2022 secondary to NASH at [facility], osteoporosis, CKD, IBD with chronic diarrhea, hypertension, hypomagnesemia who presents with greater than 24 hours of slurred speech.  The patient noticed slurred speech when she woke up yesterday.  It continued all day unchanged and was present today when she woke up.  Her husband however noted that in addition to the slurred speech it looked like her face was not moving properly.  Because of that they came in for evaluation.  In the emergency department her workup included a CT which was unremarkable.  She then had an MRI which revealed an acute right corona radiata CVA.  He denies any headache or weakness or numbness or tingling. At the time of admission the patient continues to have the slurred speech and mild facial droop but she does not have a change in her strength.  The hospitalists will be admitting for stroke workup.   Review of Systems: As mentioned in the history of present illness. All other systems reviewed and are negative. Past Medical History:  Diagnosis Date   Ascites    Dysrhythmia    hx of SVT   Encephalopathy, hepatic (HCC) 05/13/2018   GERD (gastroesophageal reflux disease)    History of exercise stress test    03-07-2010  Stress echo--- no arrhythmias or conduction abnormalilites and negative for ischemia or chest pain   History of kidney stones    History of paroxysmal supraventricular tachycardia    episode 2011  consult w/ dr Graciela Husbands --  put on atenolol--  per pt no longer an issue   HTN (hypertension)    cardiologist --  dr Jens Som   IBS  (irritable bowel syndrome)    diarrhea   Nonalcoholic steatohepatitis (NASH)    Numbness and tingling of left lower extremity    post achilles tendon repair   OA (osteoarthritis)    knees   Osteoporosis    Pleural effusion on right    hepatic hydrothorax   PMB (postmenopausal bleeding) none recent   thickened endometrium   PONV (postoperative nausea and vomiting)    Recurrent UTI none since 2019   Secondary esophageal varices without bleeding (HCC) 06/03/2018   Vitamin D deficiency    Wears glasses    Past Surgical History:  Procedure Laterality Date   ACHILLES TENDON SURGERY Left 06/2016   BIOPSY  11/25/2021   Procedure: BIOPSY;  Surgeon: Iva Boop, MD;  Location: [facility];  Service: Endoscopy;;   BREAST BIOPSY Left 02/2016   benign   CARDIAC CATHETERIZATION     COLONOSCOPY  2005   COLONOSCOPY WITH PROPOFOL N/A 11/25/2021   Procedure: COLONOSCOPY WITH PROPOFOL;  Surgeon: Iva Boop, MD;  Location: [facility];  Service: Endoscopy;  Laterality: N/A;   CT CTA CORONARY W/CA SCORE W/CM &/OR WO/CM  01/01/2014   non-obstructive calcified plaque in pLAD (0-25%), no significant incidental noncardiac findings noted   DILATATION & CURETTAGE/HYSTEROSCOPY WITH MYOSURE N/A 12/02/2020   Procedure: DILATATION & CURETTAGE/HYSTEROSCOPY WITH POLYPECTOMY AND REMOVAL OF VAGINAL LESION;  Surgeon: Theresia Majors, MD;  Location: [facility];  Service: Gynecology;  Laterality: N/A;  request to follow in [facility] iqueue held (DR. Penni Bombard has two other cases that morning starting 7:30am)   ESOPHAGOGASTRODUODENOSCOPY  01/2014   EXTRACORPOREAL SHOCK WAVE LITHOTRIPSY  2010   KNEE ARTHROPLASTY Right 03/24/2018   Procedure: RIGHT TOTAL KNEE ARTHROPLASTY WITH COMPUTER NAVIGATION;  Surgeon: Samson Frederic, MD;  Location: [facility];  Service: Orthopedics;  Laterality: Right;  Needs RNFA   KNEE ARTHROSCOPY Right 12/04/2016   Procedure: ARTHROSCOPY KNEE WITH PARTIAL MEDIAL  MENISCECTOMY;  Surgeon: Samson Frederic, MD;  Location: [facility];  Service: Orthopedics;  Laterality: Right;   LEFT HEART CATH AND CORONARY ANGIOGRAPHY N/A 11/20/2021   Procedure: LEFT HEART CATH AND CORONARY ANGIOGRAPHY;  Surgeon: Kathleene Hazel, MD;  Location: [facility];  Service: Cardiovascular;  Laterality: N/A;   LEFT HEART CATHETERIZATION WITH CORONARY ANGIOGRAM N/A 01/11/2012   Procedure: LEFT HEART CATHETERIZATION WITH CORONARY ANGIOGRAM;  Surgeon: Tonny Bollman, MD;  Location: [facility] CATH LAB;  Service: Cardiovascular;  Laterality: N/A;  widely patent coronary arterires without significant obstructive CAD,  normal LVF, ef 55-56%   LIVER TRANSPLANTATION  06/03/2022   TONSILLECTOMY AND ADENOIDECTOMY  child   TRANSTHORACIC ECHOCARDIOGRAM  01/18/2012   mild LVH,  ef 60%/  trivial TR   TUBAL LIGATION  1985   UPPER GASTROINTESTINAL ENDOSCOPY  last done 2019   Social History:  reports that she has never smoked. She has never used smokeless tobacco. She reports that she does not currently use alcohol. She reports that she does not use drugs.  Allergies  Allergen Reactions   Atorvastatin Other (See Comments)    Muscle pain and cramps   Rosuvastatin Other (See Comments)    Muscle pain and cramps    Family History  Problem Relation Age of Onset   Lymphoma Mother        non-hodgkins - died @ 30   Coronary artery disease Mother    Coronary artery disease Father        CABG in his 31s, died @ 28   Diabetes Father    Healthy Brother    Hypertension Sister    Arthritis Sister    IgA nephropathy Daughter    Kidney failure Daughter    Celiac disease Daughter    Breast cancer Maternal Aunt    Colon cancer Neg Hx    Esophageal cancer Neg Hx    Stomach cancer Neg Hx    Rectal cancer Neg Hx    Ovarian cancer Neg Hx    Cervical cancer Neg Hx     Prior to Admission medications   Medication Sig Start Date End Date Taking? Authorizing Provider  aspirin EC  (ASPIRIN 81) 81 MG tablet Take 81 mg by mouth daily.   Yes [provider]  Calcium Carbonate-Vit D-Min (CALTRATE 600+D PLUS MINERALS) 600-800 MG-UNIT TABS Take 2 tablets by mouth in the morning and at bedtime. 07/03/22  Yes [provider]  Cholecalciferol (VITAMIN D3 PO) Take 1 capsule by mouth daily.   Yes [provider]  denosumab (PROLIA) 60 MG/ML SOSY injection    Yes [provider]  diphenhydramine-acetaminophen (TYLENOL PM) 25-500 MG TABS tablet Take 1 tablet by mouth at bedtime.   Yes [provider]  diphenoxylate-atropine (LOMOTIL) 2.5-0.025 MG tablet Take 1 tablet by mouth every 6 (six) hours as needed for diarrhea or loose stools. 09/06/23  Yes Zehr, Princella Pellegrini, PA-C  GNP MELATONIN 3 MG TABS tablet Take 6 mg by mouth at bedtime. 2 at bedtime 06/15/22  Yes [provider]  lactobacillus acidophilus (BACID) TABS tablet Take 2 tablets by mouth 3 (three) times daily.   Yes [provider]  metoprolol tartrate (LOPRESSOR) 25 MG tablet Take 1 tablet (25 mg total) by mouth 2 (two) times daily. 11/24/22  Yes Angiulli, Mcarthur Rossetti, PA-C  mycophenolate (CELLCEPT) 250 MG capsule Take 250 mg by mouth 2 (two) times daily. 08/05/22  Yes [provider]  Specialty Vitamins Products (MG PLUS PROTEIN) 133 MG TABS Take 3 tablets by mouth in the morning and at bedtime. 07/06/22  Yes [provider]  tacrolimus (PROGRAF) 1 MG capsule Take 4 capsules (4 mg total) by mouth every 12 (twelve) hours. Patient taking differently: Take 3 mg by mouth every 12 (twelve) hours. Take 3 capsules by mouth every Morning, and 2 capsules at bedtime. 11/24/22  Yes Angiulli, Mcarthur Rossetti, PA-C  ezetimibe (ZETIA) 10 MG tablet Take 1 tablet (10 mg total) by mouth daily. Patient not taking: Reported on 09/06/2023 10/23/22 10/18/23  Lewayne Bunting, MD    Physical Exam: Vitals:   10/24/23 0933 10/24/23 1020 10/24/23 1130 10/24/23 1718  BP: (!) 156/87 (!)  169/89 136/82 (!) 163/80  Pulse: 65 63 62 72  Resp: 17 19 15 16   Temp:    98 F (36.7 C)  TempSrc:    Oral  SpO2: 98% 95% 95% 98%   Physical Exam:  General: No acute distress, well developed, well nourished HEENT: Normocephalic, atraumatic, PERRL Cardiovascular: Normal rate and rhythm. Distal pulses intact. Pulmonary: Normal pulmonary effort, normal breath sounds Gastrointestinal: Nondistended abdomen, soft, non-tender, normoactive bowel sounds Musculoskeletal: History of right knee replacement, no lower ext edema Skin: Skin is warm and dry. Neuro: Slurred speech and mild left facial droop, AAOx3, her left lower extremity moves slower than the right PSYCH: Attentive and cooperative  Data Reviewed:  Results for orders placed or performed during the hospital encounter of 10/24/23 (from the past 24 hour(s))  Ethanol     Status: None   Collection Time: 10/24/23  9:58 AM  Result Value Ref Range   Alcohol, Ethyl (B) <10 <10 mg/dL  Protime-INR     Status: None   Collection Time: 10/24/23  9:58 AM  Result Value Ref Range   Prothrombin Time 12.4 11.4 - 15.2 seconds   INR 0.9 0.8 - 1.2  APTT     Status: None   Collection Time: 10/24/23  9:58 AM  Result Value Ref Range   aPTT 27 24 - 36 seconds  CBC     Status: Abnormal   Collection Time: 10/24/23  9:58 AM  Result Value Ref Range   WBC 4.4 4.0 - 10.5 K/uL   RBC 4.92 3.87 - 5.11 MIL/uL   Hemoglobin 12.5 12.0 - 15.0 g/dL   HCT 40.9 81.1 - 91.4 %   MCV 78.3 (L) 80.0 - 100.0 fL   MCH 25.4 (L) 26.0 - 34.0 pg   MCHC 32.5 30.0 - 36.0 g/dL   RDW 78.2 95.6 - 21.3 %   Platelets 199 150 - 400 K/uL   nRBC 0.0 0.0 - 0.2 %  Differential     Status: None   Collection Time: 10/24/23  9:58 AM  Result Value Ref Range   Neutrophils Relative % 53 %   Neutro Abs 2.4 1.7 - 7.7 K/uL   Lymphocytes Relative 31 %   Lymphs Abs 1.4 0.7 - 4.0 K/uL   Monocytes Relative 10 %   Monocytes Absolute 0.4 0.1 - 1.0 K/uL  Eosinophils Relative 4 %    Eosinophils Absolute 0.2 0.0 - 0.5 K/uL   Basophils Relative 1 %   Basophils Absolute 0.0 0.0 - 0.1 K/uL   Immature Granulocytes 1 %   Abs Immature Granulocytes 0.02 0.00 - 0.07 K/uL  Comprehensive metabolic panel     Status: Abnormal   Collection Time: 10/24/23  9:58 AM  Result Value Ref Range   Sodium 138 135 - 145 mmol/L   Potassium 4.1 3.5 - 5.1 mmol/L   Chloride 103 98 - 111 mmol/L   CO2 26 22 - 32 mmol/L   Glucose, Bld 89 70 - 99 mg/dL   BUN 28 (H) 8 - 23 mg/dL   Creatinine, Ser 1.60 (H) 0.44 - 1.00 mg/dL   Calcium 9.8 8.9 - 10.9 mg/dL   Total Protein 7.0 6.5 - 8.1 g/dL   Albumin 4.6 3.5 - 5.0 g/dL   AST 18 15 - 41 U/L   ALT 13 0 - 44 U/L   Alkaline Phosphatase 45 38 - 126 U/L   Total Bilirubin 0.8 0.3 - 1.2 mg/dL   GFR, Estimated 44 (L) >60 mL/min   Anion gap 9 5 - 15  Urinalysis, Routine w reflex microscopic -Urine, Clean Catch     Status: Abnormal   Collection Time: 10/24/23  9:58 AM  Result Value Ref Range   Color, Urine YELLOW YELLOW   APPearance HAZY (A) CLEAR   Specific Gravity, Urine 1.030 1.005 - 1.030   pH 5.5 5.0 - 8.0   Glucose, UA NEGATIVE NEGATIVE mg/dL   Hgb urine dipstick NEGATIVE NEGATIVE   Bilirubin Urine SMALL (A) NEGATIVE   Ketones, ur NEGATIVE NEGATIVE mg/dL   Protein, ur NEGATIVE NEGATIVE mg/dL   Nitrite NEGATIVE NEGATIVE   Leukocytes,Ua TRACE (A) NEGATIVE  Urinalysis, Microscopic (reflex)     Status: Abnormal   Collection Time: 10/24/23  9:58 AM  Result Value Ref Range   RBC / HPF NONE SEEN 0 - 5 RBC/hpf   WBC, UA 0-5 0 - 5 WBC/hpf   Bacteria, UA FEW (A) NONE SEEN   Squamous Epithelial / HPF 0-5 0 - 5 /HPF   Non Squamous Epithelial PRESENT (A) NONE SEEN   Ca Oxalate Crys, UA PRESENT    Urine-Other LESS THAN 10 mL OF URINE SUBMITTED      Assessment and Plan:  Acute stroke involving right corona radiata Neurology Recommendations: # Right corona radiata stroke - Stroke labs, HgbA1c, fasting lipid panel (ordered) - Holding off on  statin due to statin intolerance and patient reporting her transplant team has asked her to hold off on further statins at this time; consider PSK 9 inhibitor if cleared by transplant team - MRA of the brain without contrast and Carotid duplex (ordered)  - Frequent neuro checks - Echocardiogram (ordered) - Continue home aspirin 81 mg daily - Plavix 300 mg load with 75 mg daily for 21 - 90 day course (to be determined pending vessel imaging, to be ordered pending clearance by transplant team) - Risk factor modification - Telemetry monitoring - Blood pressure goal              - Normotension as she is out of the window for permissive hypertension - Speech consult only as she has minimal other physical deficits - Stroke team to follow   2.  Status post liver transplant - will continue her routine antirejection medications  3.  Hypertension will continue her beta-blocker  4.  IBS with chronic diarrhea  -the  patient recently started a FODMAP diet which is helping.  She does still use Lomotil as needed    Advance Care Planning:   Code Status: Prior the patient wants to be full code and names her husband as her healthcare power of attorney  Consults: Neurology  Family Communication: Daughter and husband at bedside  Severity of Illness: The appropriate patient status for this patient is INPATIENT. Inpatient status is judged to be reasonable and necessary in order to provide the required intensity of service to ensure the patient's safety. The patient's presenting symptoms, physical exam findings, and initial radiographic and laboratory data in the context of their chronic comorbidities is felt to place them at high risk for further clinical deterioration. Furthermore, it is not anticipated that the patient will be medically stable for discharge from the hospital within 2 midnights of admission.   * I certify that at the point of admission it is my clinical judgment that the patient will require  inpatient hospital care spanning beyond 2 midnights from the point of admission due to high intensity of service, high risk for further deterioration and high frequency of surveillance required.*  Author: Buena Irish, MD 10/24/2023 7:29 PM  For on call review [email or web address].

## 2023-10-24 NOTE — ED Triage Notes (Signed)
She c/o left-sided lower facial droop with some dysarthria x 2 days. Her husband is with her. She is ambulatory and in no distress.

## 2023-10-24 NOTE — ED Notes (Signed)
Report received from [place], [facility]

## 2023-10-24 NOTE — ED Provider Notes (Signed)
[place] EMERGENCY DEPARTMENT AT [facility] Provider Note   CSN: [id number] Arrival date & time: 10/24/23  [id number]     History  Chief Complaint  Patient presents with   Facial Droop    Dawn Moon is a 67 y.o. female.  HPI   67 year old female with medical history significant for GERD, nonalcoholic steatohepatitis, liver cirrhosis and hepatic cephalopathy and bleeding esophageal varices status post liver transplant who presents to the emergency department with strokelike symptoms.  The patient states that she was last normal when she went to bed on Friday night.  She woke up Saturday morning with slurred speech, a sensation of numbness in the left side of her face with associated left-sided facial droop.  Symptoms have persisted prompting her presentation to the emergency department.  She denies any weakness or numbness of the extremities. No vision changes.  Home Medications Prior to Admission medications   Medication Sig Start Date End Date Taking? Authorizing Provider  acetaminophen (TYLENOL) 325 MG tablet Take 650 mg by mouth 3 (three) times daily as needed for headache (and back pain). 06/03/22   [provider]  Calcium Carbonate-Vit D-Min (CALTRATE 600+D PLUS MINERALS) 600-800 MG-UNIT TABS Take 2 tablets by mouth in the morning and at bedtime. 07/03/22   [provider]  Cholecalciferol (VITAMIN D3 PO) Take 1 capsule by mouth daily. Patient not taking: Reported on 09/06/2023    [provider]  denosumab (PROLIA) 60 MG/ML SOSY injection     [provider]  diphenoxylate-atropine (LOMOTIL) 2.5-0.025 MG tablet Take 1 tablet by mouth every 6 (six) hours as needed for diarrhea or loose stools. 09/06/23   Zehr, Princella Pellegrini, PA-C  ezetimibe (ZETIA) 10 MG tablet Take 1 tablet (10 mg total) by mouth daily. Patient not taking: Reported on 09/06/2023 10/23/22 10/18/23  Lewayne Bunting, MD  GNP MELATONIN 3 MG TABS tablet Take 6 mg by  mouth at bedtime. 2 at bedtime 06/15/22   [provider]  lactobacillus acidophilus (BACID) TABS tablet Take 2 tablets by mouth 3 (three) times daily.    [provider]  Lidocaine 4 % PTCH Apply 1 application  topically daily as needed (for back pain).    [provider]  methocarbamol 1000 MG TABS Take 1,000 mg by mouth every 8 (eight) hours. 11/24/22   Angiulli, Mcarthur Rossetti, PA-C  metoprolol tartrate (LOPRESSOR) 25 MG tablet Take 1 tablet (25 mg total) by mouth 2 (two) times daily. 11/24/22   Angiulli, Mcarthur Rossetti, PA-C  mycophenolate (CELLCEPT) 250 MG capsule Take 250 mg by mouth 2 (two) times daily. 08/05/22   [provider]  Omega-3 Fatty Acids (FISH OIL) 1000 MG CAPS Take by mouth.    [provider]  omeprazole (PRILOSEC) 20 MG capsule Take 20 mg by mouth daily. Patient not taking: Reported on 09/06/2023 09/20/21   [provider]  OVER THE COUNTER MEDICATION Pt taking every day  tumeric biopherine garlic and ginger 2360 mg    [provider]  oxyCODONE 10 MG TABS Take 1-1.5 tablets (10-15 mg total) by mouth every 4 (four) hours as needed for severe pain or moderate pain (5mg  moderate, 10mg  severe). 11/24/22   Angiulli, Mcarthur Rossetti, PA-C  predniSONE (DELTASONE) 5 MG tablet Take 5 mg by mouth daily with breakfast. Patient not taking: Reported on 09/06/2023 07/28/22   [provider]  rifaximin (XIFAXAN) 550 MG TABS tablet Take 1 tablet (550 mg total) by mouth 3 (three) times daily. 09/22/23  Zehr, Princella Pellegrini, PA-C  Specialty Vitamins Products (MG PLUS PROTEIN) 133 MG TABS Take 3 tablets by mouth in the morning and at bedtime. 07/06/22   [provider]  tacrolimus (PROGRAF) 1 MG capsule Take 4 capsules (4 mg total) by mouth every 12 (twelve) hours. Patient taking differently: Take 4 mg by mouth every 12 (twelve) hours. Pt taking 3 capsules 3 mg a day 11/24/22   Charlton Amor, PA-C      Allergies    Atorvastatin and  Rosuvastatin    Review of Systems   Review of Systems  All other systems reviewed and are negative.   Physical Exam Updated Vital Signs BP 136/82   Pulse 62   Temp 98 F (36.7 C) (Oral)   Resp 15   SpO2 95%  Physical Exam Vitals and nursing note reviewed.  Constitutional:      General: She is not in acute distress.    Appearance: She is well-developed.  HENT:     Head: Normocephalic and atraumatic.  Eyes:     Conjunctiva/sclera: Conjunctivae normal.  Cardiovascular:     Rate and Rhythm: Normal rate and regular rhythm.  Pulmonary:     Effort: Pulmonary effort is normal. No respiratory distress.     Breath sounds: Normal breath sounds.  Abdominal:     Palpations: Abdomen is soft.     Tenderness: There is no abdominal tenderness.  Musculoskeletal:        General: No swelling.     Cervical back: Neck supple.  Skin:    General: Skin is warm and dry.     Capillary Refill: Capillary refill takes less than 2 seconds.  Neurological:     Mental Status: She is alert.     Comments: MENTAL STATUS EXAM:    Orientation: Alert and oriented to person, place and time.  Memory: Cooperative, follows commands well.  Language: Speech is mildly slurred.  CRANIAL NERVES:    CN 2 (Optic): Visual fields intact to confrontation.  CN 3,4,6 (EOM): Pupils equal and reactive to light. Full extraocular eye movement without nystagmus.  CN 5 (Trigeminal): Facial sensation is diminished on the left, no weakness of masticatory muscles.  CN 7 (Facial):Slight LFD CN 8 (Auditory): Auditory acuity grossly normal.  CN 9,10 (Glossophar): The uvula is midline, the palate elevates symmetrically.  CN 11 (spinal access): Normal sternocleidomastoid and trapezius strength.  CN 12 (Hypoglossal): The tongue is deviated to the left. No atrophy or fasciculations.Marland Kitchen   MOTOR:  Muscle Strength: 5/5RUE, 5/5LUE, 5/5RLE, 5/5LLE.   COORDINATION:   No tremor.   SENSATION:   Intact to light touch all four  extremities.  GAIT: Gait normal without ataxia   Psychiatric:        Mood and Affect: Mood normal.     ED Results / Procedures / Treatments   Labs (all labs ordered are listed, but only abnormal results are displayed) Labs Reviewed  CBC - Abnormal; Notable for the following components:      Result Value   MCV 78.3 (*)    MCH 25.4 (*)    All other components within normal limits  COMPREHENSIVE METABOLIC PANEL - Abnormal; Notable for the following components:   BUN 28 (*)    Creatinine, Ser 1.32 (*)    GFR, Estimated 44 (*)    All other components within normal limits  URINALYSIS, ROUTINE W REFLEX MICROSCOPIC - Abnormal; Notable for the following components:   APPearance HAZY (*)    Bilirubin Urine SMALL (*)  Leukocytes,Ua TRACE (*)    All other components within normal limits  URINALYSIS, MICROSCOPIC (REFLEX) - Abnormal; Notable for the following components:   Bacteria, UA FEW (*)    Non Squamous Epithelial PRESENT (*)    All other components within normal limits  ETHANOL  PROTIME-INR  APTT  DIFFERENTIAL  RAPID URINE DRUG SCREEN, HOSP PERFORMED    EKG EKG Interpretation Date/Time:  Sunday October 24 2023 09:31:20 EST Ventricular Rate:  62 PR Interval:  138 QRS Duration:  77 QT Interval:  391 QTC Calculation: 397 R Axis:   157  Text Interpretation: Right and left arm electrode reversal, interpretation assumes no reversal Sinus or ectopic atrial rhythm Probable lateral infarct, age indeterminate Confirmed by Ernie Avena ([id number]) on 10/24/2023 10:30:34 AM  Radiology CT HEAD WO CONTRAST  Result Date: 10/24/2023 CLINICAL DATA:  Left-sided lower facial droop with some dysarthria for 2 days. EXAM: CT HEAD WITHOUT CONTRAST TECHNIQUE: Contiguous axial images were obtained from the base of the skull through the vertex without intravenous contrast. RADIATION DOSE REDUCTION: This exam was performed according to the departmental dose-optimization program which includes  automated exposure control, adjustment of the mA and/or kV according to patient size and/or use of iterative reconstruction technique. COMPARISON:  Head CT 11/11/2022 FINDINGS: Brain: No evidence of acute infarction, hemorrhage, hydrocephalus, extra-axial collection or mass lesion/mass effect. Low densities in the cerebral white matter attributed to chronic small vessel disease with fairly defined and chronic appearing lacune so. There has been progression from 2023. Vascular: No hyperdense vessel or unexpected calcification. Skull: Normal. Negative for fracture or focal lesion. Sinuses/Orbits: No acute finding. IMPRESSION: 1. No acute finding. 2. Chronic small vessel disease with progression from 2023. Electronically Signed   By: Tiburcio Pea M.D.   On: 10/24/2023 10:42    Procedures Procedures    Medications Ordered in ED Medications  LORazepam (ATIVAN) tablet 0.5 mg (has no administration in time range)    ED Course/ Medical Decision Making/ A&P                                 Medical Decision Making Amount and/or Complexity of Data Reviewed Labs: ordered. Radiology: ordered.  Risk Prescription drug management.    67 year old female with medical history significant for GERD, nonalcoholic steatohepatitis, liver cirrhosis and hepatic cephalopathy and bleeding esophageal varices status post liver transplant who presents to the emergency department with strokelike symptoms.  The patient states that she was last normal when she went to bed on Friday night.  She woke up Saturday morning with slurred speech, a sensation of numbness in the left side of her face with associated left-sided facial droop.  Symptoms have persisted prompting her presentation to the emergency department.  She denies any weakness or numbness of the extremities.  On arrival, the patient was vitally stable, afebrile, not tachycardic or tachypneic, hypertensive BP 156/87, saturating on room air.  Physical exam revealed  left-sided facial droop, subjective decrease sensation to the left, slurred speech present.  Symptoms been ongoing for the past 2 days.  Concern for potential CVA.  Outside the window for TNK.  Screening laboratory evaluation revealed a CBC without a leukocytosis or anemia, CMP with mildly elevated serum creatinine to 1.32, no significant electrolyte abnormality, urinalysis with negative nitrates, trace leukocytes, 0-5 WBCs and few bacteria present, unconvincing for UTI, UDS pending, ethanol level normal.  CT imaging of the head was performed: IMPRESSION:  1.  No acute finding.  2. Chronic small vessel disease with progression from 2023.    Given the patient presentation, concern for potential CVA and patient will need MRI imaging of the brain to further evaluate.  Family were updated regarding the plan of care and results of testing.  Dr. Suezanne Jacquet was consulted for ER to ER transfer and accepted the patient in transfer.  The patient was subsequently transferred in stable condition.   Final Clinical Impression(s) / ED Diagnoses Final diagnoses:  Stroke-like symptoms    Rx / DC Orders ED Discharge Orders     None         Ernie Avena, MD 10/24/23 1329

## 2023-10-24 NOTE — Discharge Instructions (Signed)
You are being transferred ER to ER to [facility] for MRI imaging to rule out stroke

## 2023-10-24 NOTE — ED Provider Notes (Signed)
  Physical Exam  BP (!) 163/80 (BP Location: Left Arm)   Pulse 72   Temp 98 F (36.7 C) (Oral)   Resp 16   SpO2 98%   Physical Exam  Procedures  Procedures  ED Course / MDM    Medical Decision Making Patient was transferred from [facility] for MRI brain.  Patient woke up yesterday and had slurred speech and left-sided facial droop.  6:36 PM MRI showed right corona radiata acute to subacute stroke.  I discussed case with Dr. Iver Nestle from neurology.  She will see patient as a consult and hospitalist to admit  Amount and/or Complexity of Data Reviewed Labs: ordered. Decision-making details documented in ED Course. Radiology: ordered and independent interpretation performed. Decision-making details documented in ED Course.  Risk Prescription drug management. Decision regarding hospitalization.          Charlynne Pander, MD 10/24/23 [phone]
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
## 2023-10-24 NOTE — ED Notes (Signed)
MRI contacted this writer to inquire about patient having MRI done. MRI sending transport at this time. This Clinical research associate made contact with 3W to inform same of delay.

## 2023-10-24 NOTE — ED Notes (Signed)
MRI contacted this writer to inform that they would be taking patient to floor, this Clinical research associate contacted 3W and spoke with secretary Zoey to inform her of same.

## 2023-10-24 NOTE — ED Notes (Signed)
This Clinical research associate spoke with Neurosurgeon Zoey to inform that PT was coming up to unit.

## 2023-10-25 ENCOUNTER — Inpatient Hospital Stay (HOSPITAL_BASED_OUTPATIENT_CLINIC_OR_DEPARTMENT_OTHER): Payer: Medicare Other

## 2023-10-25 ENCOUNTER — Encounter: Payer: Self-pay | Admitting: Cardiology

## 2023-10-25 ENCOUNTER — Inpatient Hospital Stay (HOSPITAL_COMMUNITY): Payer: Medicare Other

## 2023-10-25 ENCOUNTER — Other Ambulatory Visit: Payer: Self-pay | Admitting: Cardiology

## 2023-10-25 DIAGNOSIS — I639 Cerebral infarction, unspecified: Secondary | ICD-10-CM | POA: Diagnosis not present

## 2023-10-25 DIAGNOSIS — I6389 Other cerebral infarction: Secondary | ICD-10-CM

## 2023-10-25 DIAGNOSIS — I6381 Other cerebral infarction due to occlusion or stenosis of small artery: Secondary | ICD-10-CM | POA: Diagnosis not present

## 2023-10-25 DIAGNOSIS — I2510 Atherosclerotic heart disease of native coronary artery without angina pectoris: Secondary | ICD-10-CM

## 2023-10-25 LAB — CBC
HCT: 38 % (ref 36.0–46.0)
Hemoglobin: 12.3 g/dL (ref 12.0–15.0)
MCH: 25.7 pg — ABNORMAL LOW (ref 26.0–34.0)
MCHC: 32.4 g/dL (ref 30.0–36.0)
MCV: 79.5 fL — ABNORMAL LOW (ref 80.0–100.0)
Platelets: 173 10*3/uL (ref 150–400)
RBC: 4.78 MIL/uL (ref 3.87–5.11)
RDW: 14.1 % (ref 11.5–15.5)
WBC: 3.5 10*3/uL — ABNORMAL LOW (ref 4.0–10.5)
nRBC: 0 % (ref 0.0–0.2)

## 2023-10-25 LAB — ECHOCARDIOGRAM COMPLETE
AR max vel: 2.95 cm2
AV Area VTI: 3.1 cm2
AV Area mean vel: 2.76 cm2
AV Mean grad: 3 mm[Hg]
AV Peak grad: 5.5 mm[Hg]
Ao pk vel: 1.17 m/s
Area-P 1/2: 3.99 cm2

## 2023-10-25 LAB — LIPID PANEL
Cholesterol: 217 mg/dL — ABNORMAL HIGH (ref 0–200)
HDL: 53 mg/dL (ref >40)
LDL Cholesterol: 98 mg/dL (ref 0–99)
Total CHOL/HDL Ratio: 4.1 {ratio}
Triglycerides: 332 mg/dL — ABNORMAL HIGH (ref <150)
VLDL: 66 mg/dL — ABNORMAL HIGH (ref 0–40)

## 2023-10-25 LAB — BASIC METABOLIC PANEL
Anion gap: 8 (ref 5–15)
BUN: 18 mg/dL (ref 8–23)
CO2: 26 mmol/L (ref 22–32)
Calcium: 9.5 mg/dL (ref 8.9–10.3)
Chloride: 104 mmol/L (ref 98–111)
Creatinine, Ser: 1.24 mg/dL — ABNORMAL HIGH (ref 0.44–1.00)
GFR, Estimated: 48 mL/min — ABNORMAL LOW (ref >60)
Glucose, Bld: 100 mg/dL — ABNORMAL HIGH (ref 70–99)
Potassium: 4.7 mmol/L (ref 3.5–5.1)
Sodium: 138 mmol/L (ref 135–145)

## 2023-10-25 LAB — HEMOGLOBIN A1C
Hgb A1c MFr Bld: 5.3 % (ref 4.8–5.6)
Mean Plasma Glucose: 105.41 mg/dL

## 2023-10-25 LAB — TSH: TSH: 1.465 u[IU]/mL (ref 0.350–4.500)

## 2023-10-25 MED ORDER — ASPIRIN 81 MG PO TBEC: 81.0000 mg | DELAYED_RELEASE_TABLET | Freq: Every day | ORAL | 0 refills | Status: AC

## 2023-10-25 MED ORDER — ADULT MULTIVITAMIN W/MINERALS CH
1.0000 | ORAL_TABLET | Freq: Every day | ORAL | Status: DC
  Administered 2023-10-25: 1 via ORAL
  Filled 2023-10-25: qty 1

## 2023-10-25 MED ORDER — CLOPIDOGREL BISULFATE 75 MG PO TABS
75.0000 mg | ORAL_TABLET | Freq: Every day | ORAL | Status: DC
  Administered 2023-10-25: 75 mg via ORAL
  Filled 2023-10-25: qty 1

## 2023-10-25 MED ORDER — CLOPIDOGREL BISULFATE 75 MG PO TABS: 75.0000 mg | ORAL_TABLET | Freq: Every day | ORAL | 2 refills | Status: AC

## 2023-10-25 NOTE — Plan of Care (Signed)
  Problem: Health Behavior/Discharge Planning: Goal: Ability to manage health-related needs will improve Outcome: Progressing   
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
## 2023-10-25 NOTE — Care Management CC44 (Signed)
Condition Code 44 Documentation Completed  Patient Details  Name: Dawn Moon MRN: [id number] Date of Birth: 22-Aug-1956   Condition Code 44 given:  Yes Patient signature on Condition Code 44 notice:  Yes Documentation of 2 MD's agreement:  Yes Code 44 added to claim:  Yes    Kermit Balo, RN 10/25/2023, 3:40 PM

## 2023-10-25 NOTE — Evaluation (Signed)
Clinical/Bedside Swallow Evaluation Patient Details  Name: Dawn Moon MRN: [id number] Date of Birth: 1956-07-20  Today's Date: 10/25/2023 Time: SLP Start Time (ACUTE ONLY): 0865 SLP Stop Time (ACUTE ONLY): 0853 SLP Time Calculation (min) (ACUTE ONLY): 30 min  Past Medical History:  Past Medical History:  Diagnosis Date   Ascites    Dysrhythmia    hx of SVT   Encephalopathy, hepatic (HCC) 05/13/2018   GERD (gastroesophageal reflux disease)    History of exercise stress test    03-07-2010  Stress echo--- no arrhythmias or conduction abnormalilites and negative for ischemia or chest pain   History of kidney stones    History of paroxysmal supraventricular tachycardia    episode 2011  consult w/ dr Graciela Husbands --  put on atenolol--  per pt no longer an issue   HTN (hypertension)    cardiologist --  dr Jens Som   IBS (irritable bowel syndrome)    diarrhea   Nonalcoholic steatohepatitis (NASH)    Numbness and tingling of left lower extremity    post achilles tendon repair   OA (osteoarthritis)    knees   Osteoporosis    Pleural effusion on right    hepatic hydrothorax   PMB (postmenopausal bleeding) none recent   thickened endometrium   PONV (postoperative nausea and vomiting)    Recurrent UTI none since 2019   Secondary esophageal varices without bleeding (HCC) 06/03/2018   Vitamin D deficiency    Wears glasses    Past Surgical History:  Past Surgical History:  Procedure Laterality Date   ACHILLES TENDON SURGERY Left 06/2016   BIOPSY  11/25/2021   Procedure: BIOPSY;  Surgeon: Iva Boop, MD;  Location: [facility];  Service: Endoscopy;;   BREAST BIOPSY Left 02/2016   benign   CARDIAC CATHETERIZATION     COLONOSCOPY  2005   COLONOSCOPY WITH PROPOFOL N/A 11/25/2021   Procedure: COLONOSCOPY WITH PROPOFOL;  Surgeon: Iva Boop, MD;  Location: [facility];  Service: Endoscopy;  Laterality: N/A;   CT CTA CORONARY W/CA SCORE W/CM &/OR WO/CM  01/01/2014    non-obstructive calcified plaque in pLAD (0-25%), no significant incidental noncardiac findings noted   DILATATION & CURETTAGE/HYSTEROSCOPY WITH MYOSURE N/A 12/02/2020   Procedure: DILATATION & CURETTAGE/HYSTEROSCOPY WITH POLYPECTOMY AND REMOVAL OF VAGINAL LESION;  Surgeon: Theresia Majors, MD;  Location: [facility];  Service: Gynecology;  Laterality: N/A;  request to follow in [facility] iqueue held (DR. Penni Bombard has two other cases that morning starting 7:30am)   ESOPHAGOGASTRODUODENOSCOPY  01/2014   EXTRACORPOREAL SHOCK WAVE LITHOTRIPSY  2010   KNEE ARTHROPLASTY Right 03/24/2018   Procedure: RIGHT TOTAL KNEE ARTHROPLASTY WITH COMPUTER NAVIGATION;  Surgeon: Samson Frederic, MD;  Location: [facility];  Service: Orthopedics;  Laterality: Right;  Needs RNFA   KNEE ARTHROSCOPY Right 12/04/2016   Procedure: ARTHROSCOPY KNEE WITH PARTIAL MEDIAL MENISCECTOMY;  Surgeon: Samson Frederic, MD;  Location: [facility] [place];  Service: Orthopedics;  Laterality: Right;   LEFT HEART CATH AND CORONARY ANGIOGRAPHY N/A 11/20/2021   Procedure: LEFT HEART CATH AND CORONARY ANGIOGRAPHY;  Surgeon: Kathleene Hazel, MD;  [facility]: [facility];  Service: Cardiovascular;  Laterality: N/A;   LEFT HEART CATHETERIZATION WITH CORONARY ANGIOGRAM N/A 01/11/2012   Procedure: LEFT HEART CATHETERIZATION WITH CORONARY ANGIOGRAM;  Surgeon: Tonny Bollman, MD;  Location: [facility] CATH LAB;  Service: Cardiovascular;  Laterality: N/A;  widely patent coronary arterires without significant obstructive CAD,  normal LVF, ef 55-56%   LIVER TRANSPLANTATION  06/03/2022  TONSILLECTOMY AND ADENOIDECTOMY  child   TRANSTHORACIC ECHOCARDIOGRAM  01/18/2012   mild LVH,  ef 60%/  trivial TR   TUBAL LIGATION  1985   UPPER GASTROINTESTINAL ENDOSCOPY  last done 2019   HPI:  Dawn Moon is a 67 y.o. female with medical history significant for liver transplant 05/2022 secondary to NASH at [facility],  osteoporosis, CKD, IBD with chronic diarrhea, hypertension, hypomagnesemia who presents with greater than 24 hours of slurred speech.  The patient noticed slurred speech when she woke up yesterday.  It continued all day unchanged and was present today when she woke up.  Her husband however noted that in addition to the slurred speech it looked like her face was not moving properly.  Because of that they came in for evaluation.  In the emergency department her workup included a CT which was unremarkable.  She then had an MRI which revealed an acute right corona radiata CVA.  He denies any headache or weakness or numbness or tingling.  At the time of admission the patient continues to have the slurred speech and mild facial droop but she does not have a change in her strength.  The hospitalists will be admitting for stroke workup.    Assessment / Plan / Recommendation  Clinical Impression   Pt presents with a mild oral dysphagia per clinical swallow assessment completed today. Oral deficits consisted of reduced labial and lingual weakness on L side which resulted in intermittent mild oral residue on the L side. Pt reported some concerns for pocketing with stickier or tougher solid items. She is able to clear pocketing with lingual or digital sweep. She denied concerns for aspiration with PO intake.   Mastication of regular solid observed to be [facility] with effective oral transit. Adequate labial seal formed around cup and straw without anterior spillage noted. Pt reported occasional instances of drooling is she is not concentrating on oral control. Pharyngeal swallow initiation appeared prompt with laryngeal elevation noted. No overt or subtle s/s of aspiration observed.   Continue regular diet and thin liquids as tolerated given pt is aware of pocketing and able to clear oral residue. She is able to select softer items from menu and/or cut up items as needed.   No acute SLP services identified for swallowing.  SLP reviewed lingual strengthening exercises pt can complete independently in effort to aid oral efficiency and reduce pocketing.   SLP Visit Diagnosis: Dysphagia, oral phase (R13.11);Dysarthria and anarthria (R47.1)    Aspiration Risk  No limitations    Diet Recommendation Regular;Thin liquid    Liquid Administration via: Cup;Straw Medication Administration: Whole meds with liquid Supervision: Patient able to self feed Postural Changes: Seated upright at 90 degrees    Other  Recommendations Oral Care Recommendations: Oral care BID    Recommendations for follow up therapy are one component of a multi-disciplinary discharge planning process, led by the attending physician.  Recommendations may be updated based on patient status, additional functional criteria and insurance authorization.  Follow up Recommendations Outpatient SLP      Assistance Recommended at Discharge None  Functional Status Assessment Patient has had a recent decline in their functional status and demonstrates the ability to make significant improvements in function in a reasonable and predictable amount of time.  Frequency and Duration  (no acute SLP needs)          Prognosis Prognosis for improved oropharyngeal function: Good      Swallow Study   General Date of  Onset: 10/24/23 HPI: Dawn Moon is a 67 y.o. female with medical history significant for liver transplant 05/2022 secondary to NASH at [facility], osteoporosis, CKD, IBD with chronic diarrhea, hypertension, hypomagnesemia who presents with greater than 24 hours of slurred speech.  The patient noticed slurred speech when she woke up yesterday.  It continued all day unchanged and was present today when she woke up.  Her husband however noted that in addition to the slurred speech it looked like her face was not moving properly.  Because of that they came in for evaluation.  In the emergency department her workup included a CT which was  unremarkable.  She then had an MRI which revealed an acute right corona radiata CVA.  He denies any headache or weakness or numbness or tingling.  At the time of admission the patient continues to have the slurred speech and mild facial droop but she does not have a change in her strength.  The hospitalists will be admitting for stroke workup.    Oral/Motor/Sensory Function Overall Oral Motor/Sensory Function: Mild impairment Facial ROM: Reduced left Facial Symmetry: Abnormal symmetry left Facial Strength: Reduced left Facial Sensation: Within Functional Limits Lingual ROM: Within Functional Limits Lingual Symmetry: Within Functional Limits Lingual Strength: Reduced;Suspected CN XII (hypoglossal) dysfunction Lingual Sensation: Reduced Velum: Within Functional Limits Mandible: Within Functional Limits   Ice Chips Ice chips: Not tested   Thin Liquid Thin Liquid: Within functional limits Presentation: Cup;Straw    Nectar Thick Nectar Thick Liquid: Not tested   Honey Thick Honey Thick Liquid: Not tested   Puree Puree: Not tested   Solid     Solid: Within functional limits Presentation: Self Fed      Ellery Plunk 10/25/2023,9:57 AM

## 2023-10-25 NOTE — Evaluation (Signed)
Speech Language Pathology Evaluation Patient Details Name: Dawn Moon MRN: [id number] DOB: 1956/10/03 Today's Date: 10/25/2023 Time: 4098-1191 SLP Time Calculation (min) (ACUTE ONLY): 30 min  Problem List:  Patient Active Problem List   Diagnosis Date Noted   Stroke (HCC) 10/24/2023   Diarrhea 09/06/2023   Thoracic compression fracture (HCC) 11/18/2022   Splenic laceration 11/11/2022   Liver transplant status (HCC) 08/12/2022   Coronary artery disease involving native coronary artery of native heart without angina pectoris    Adnexal mass 10/25/2020   Elevated cancer antigen 125 (CA 125) 10/25/2020   Thickened endometrium 10/25/2020   Recurrent UTI 06/12/2019   Secondary insomnia 04/10/2019   Anemia 02/28/2019   Degeneration of lumbar intervertebral disc 08/06/2018   History of paroxysmal supraventricular tachycardia    Liver cirrhosis secondary to NASH (HCC) 04/23/2018   Irritable bowel syndrome-diarrhea predominant 06/15/2017   Gastroesophageal reflux disease 06/15/2017   Hyperlipidemia    Kidney stone    Essential hypertension 02/13/2010   Past Medical History:  Past Medical History:  Diagnosis Date   Ascites    Dysrhythmia    hx of SVT   Encephalopathy, hepatic (HCC) 05/13/2018   GERD (gastroesophageal reflux disease)    History of exercise stress test    03-07-2010  Stress echo--- no arrhythmias or conduction abnormalilites and negative for ischemia or chest pain   History of kidney stones    History of paroxysmal supraventricular tachycardia    episode 2011  consult w/ dr Graciela Husbands --  put on atenolol--  per pt no longer an issue   HTN (hypertension)    cardiologist --  dr Jens Som   IBS (irritable bowel syndrome)    diarrhea   Nonalcoholic steatohepatitis (NASH)    Numbness and tingling of left lower extremity    post achilles tendon repair   OA (osteoarthritis)    knees   Osteoporosis    Pleural effusion on right    hepatic hydrothorax   PMB  (postmenopausal bleeding) none recent   thickened endometrium   PONV (postoperative nausea and vomiting)    Recurrent UTI none since 2019   Secondary esophageal varices without bleeding (HCC) 06/03/2018   Vitamin D deficiency    Wears glasses    Past Surgical History:  Past Surgical History:  Procedure Laterality Date   ACHILLES TENDON SURGERY Left 06/2016   BIOPSY  11/25/2021   Procedure: BIOPSY;  Surgeon: Iva Boop, MD;  [facility];  Service: Endoscopy;;   BREAST BIOPSY Left 02/2016   benign   CARDIAC CATHETERIZATION     COLONOSCOPY  2005   COLONOSCOPY WITH PROPOFOL N/A 11/25/2021   Procedure: COLONOSCOPY WITH PROPOFOL;  Surgeon: Iva Boop, MD;  [facility]: [facility];  Service: Endoscopy;  Laterality: N/A;   CT CTA CORONARY W/CA SCORE W/CM &/OR WO/CM  01/01/2014   non-obstructive calcified plaque in pLAD (0-25%), no significant incidental noncardiac findings noted   DILATATION & CURETTAGE/HYSTEROSCOPY WITH MYOSURE N/A 12/02/2020   Procedure: DILATATION & CURETTAGE/HYSTEROSCOPY WITH POLYPECTOMY AND REMOVAL OF VAGINAL LESION;  Surgeon: Theresia Majors, MD;  Location: [facility] [place];  Service: Gynecology;  Laterality: N/A;  request to follow in [facility] iqueue held (DR. Penni Bombard has two other cases that morning starting 7:30am)   ESOPHAGOGASTRODUODENOSCOPY  01/2014   EXTRACORPOREAL SHOCK WAVE LITHOTRIPSY  2010   KNEE ARTHROPLASTY Right 03/24/2018   Procedure: RIGHT TOTAL KNEE ARTHROPLASTY WITH COMPUTER NAVIGATION;  Surgeon: Samson Frederic, MD;  Location: [facility];  Service: Orthopedics;  Laterality: Right;  Needs RNFA   KNEE ARTHROSCOPY Right 12/04/2016   Procedure: ARTHROSCOPY KNEE WITH PARTIAL MEDIAL MENISCECTOMY;  Surgeon: Samson Frederic, MD;  Location: [facility] [place];  Service: Orthopedics;  Laterality: Right;   LEFT HEART CATH AND CORONARY ANGIOGRAPHY N/A 11/20/2021   Procedure: LEFT HEART CATH AND CORONARY ANGIOGRAPHY;   Surgeon: Kathleene Hazel, MD;  [facility];  Service: Cardiovascular;  Laterality: N/A;   LEFT HEART CATHETERIZATION WITH CORONARY ANGIOGRAM N/A 01/11/2012   Procedure: LEFT HEART CATHETERIZATION WITH CORONARY ANGIOGRAM;  Surgeon: Tonny Bollman, MD;  Location: [facility] CATH LAB;  Service: Cardiovascular;  Laterality: N/A;  widely patent coronary arterires without significant obstructive CAD,  normal LVF, ef 55-56%   LIVER TRANSPLANTATION  06/03/2022   TONSILLECTOMY AND ADENOIDECTOMY  child   TRANSTHORACIC ECHOCARDIOGRAM  01/18/2012   mild LVH,  ef 60%/  trivial TR   TUBAL LIGATION  1985   UPPER GASTROINTESTINAL ENDOSCOPY  last done 2019   HPI:  Dawn Moon is a 67 y.o. female with medical history significant for liver transplant 05/2022 secondary to NASH at [facility], osteoporosis, CKD, IBD with chronic diarrhea, hypertension, hypomagnesemia who presents with greater than 24 hours of slurred speech.  The patient noticed slurred speech when she woke up yesterday.  It continued all day unchanged and was present today when she woke up.  Her husband however noted that in addition to the slurred speech it looked like her face was not moving properly.  Because of that they came in for evaluation.  In the emergency department her workup included a CT which was unremarkable.  She then had an MRI which revealed an acute right corona radiata CVA.  He denies any headache or weakness or numbness or tingling.  At the time of admission the patient continues to have the slurred speech and mild facial droop but she does not have a change in her strength.  The hospitalists will be admitting for stroke workup.   Assessment / Plan / Recommendation Clinical Impression   Pt presents with a mild dysarthria per informal speech/language assessment. Expressive and receptive language functions observed to be WNL.  Dysarthria characterized by reduced articulatory precision when pt is speaking  at her normal speech rate and for multi-syllabic words.  Pt's conversational speech is 100% intelligible to an unfamiliar listener, though reported that she sometimes has to repeat herself around family members. SLP reviewed compensatory speech strategies in effort to reduce communication breakdowns. Pt would benefit from skilled SLP services in the outpatient setting if she chooses to pursue therapy; otherwise, she is able to manage and repair speech errors.   No acute SLP needs identified at this time. SLP will sign off. Consider outpatient SLP services to address dysarthria as needed per patient's goals.     SLP Assessment  SLP Recommendation/Assessment: All further Speech Lanaguage Pathology  needs can be addressed in the next venue of care SLP Visit Diagnosis: Dysphagia, oral phase (R13.11);Dysarthria and anarthria (R47.1)    Recommendations for follow up therapy are one component of a multi-disciplinary discharge planning process, led by the attending physician.  Recommendations may be updated based on patient status, additional functional criteria and insurance authorization.    Follow Up Recommendations  Outpatient SLP    Assistance Recommended at Discharge  None  Functional Status Assessment Patient has had a recent decline in their functional status and demonstrates the ability to make significant improvements in function in a reasonable and predictable amount  of time.  Frequency and Duration  (no acute SLP needs)         SLP Evaluation Cognition  Overall Cognitive Status: Within Functional Limits for tasks assessed Arousal/Alertness: Awake/alert Orientation Level: Oriented X4       Comprehension  Auditory Comprehension Overall Auditory Comprehension: Appears within functional limits for tasks assessed    Expression Expression Primary Mode of Expression: Verbal Verbal Expression Overall Verbal Expression: Appears within functional limits for tasks assessed   Oral /  Motor  Oral Motor/Sensory Function Overall Oral Motor/Sensory Function: Mild impairment Facial ROM: Reduced left Facial Symmetry: Abnormal symmetry left Facial Strength: Reduced left Facial Sensation: Within Functional Limits Lingual ROM: Within Functional Limits Lingual Symmetry: Within Functional Limits Lingual Strength: Reduced;Suspected CN XII (hypoglossal) dysfunction Lingual Sensation: Reduced Velum: Within Functional Limits Mandible: Within Functional Limits Motor Speech Overall Motor Speech: Impaired Respiration: Within functional limits Phonation: Normal Resonance: Within functional limits Articulation: Impaired Level of Impairment: Conversation Intelligibility: Intelligibility reduced Word: 75-100% accurate Phrase: 75-100% accurate Sentence: 75-100% accurate Conversation: 75-100% accurate Motor Planning: Witnin functional limits Motor Speech Errors: Not applicable Effective Techniques: Slow rate;Over-articulate            Ellery Plunk 10/25/2023, 10:03 AM

## 2023-10-25 NOTE — TOC Transition Note (Signed)
Transition of Care [facility]) - CM/SW Discharge Note   Patient Details  Name: Dawn Moon MRN: [id number] Date of Birth: 08-Aug-1956  Transition of Care [facility]) CM/SW Contact:  Kermit Balo, RN Phone Number: 10/25/2023, 2:08 PM   Clinical Narrative:     Pt is from home with her spouse. He is with her all the time.  DME at home: rollator/ elevated toilet/ bars/ shower seat Pt manages her own medications and spouse states he can over see them for a few days to make sure she is taking correctly.  Spouse provides needed transportation and will transport home. Outpatient ST arranged with [facility]. Information on the AVS.   Final next level of care: OP Rehab Barriers to Discharge: No Barriers Identified   Patient Goals and CMS Choice CMS Medicare.gov Compare Post Acute Care list provided to:: Patient Choice offered to / list presented to : Patient  Discharge Placement                         Discharge Plan and Services Additional resources added to the After Visit Summary for                                       Social Determinants of Health (SDOH) Interventions SDOH Screenings   Food Insecurity: No Food Insecurity (09/14/2023)   Received from [facility]  Housing: Low Risk  (11/12/2022)  Transportation Needs: No Transportation Needs (09/14/2023)   Received from [facility]  Utilities: Not At Risk (09/14/2023)   Received from [facility]  Depression (PHQ2-9): Low Risk  (06/08/2022)  Financial Resource Strain: Low Risk  (09/14/2023)   Received from [facility]  Physical Activity: Insufficiently Active (09/14/2023)   Received from [facility]  Social Connections: Socially Integrated (09/14/2023)   Received from [facility]  Stress: No Stress Concern Present (09/14/2023)   Received from [facility]  Tobacco Use: Low Risk  (10/24/2023)     Readmission Risk Interventions     No data to display

## 2023-10-25 NOTE — Care Management Obs Status (Signed)
MEDICARE OBSERVATION STATUS NOTIFICATION   Patient Details  Name: Dawn Moon MRN: [id number] Date of Birth: Aug 20, 1956   Medicare Observation Status Notification Given:  Yes    Kermit Balo, RN 10/25/2023, 3:40 PM

## 2023-10-25 NOTE — Progress Notes (Signed)
PT Cancellation Note  Patient Details Name: Dawn Moon MRN: [id number] DOB: 02-29-1956   Cancelled Treatment:    Reason Eval/Treat Not Completed: (P) Patient at procedure or test/unavailable Pt is working with SLP. PT will follow back for Evaluation later this morning.  [place] Andy B. Beverely Risen PT, DPT Acute Rehabilitation Services Please use secure chat or  Call Office [phone]    Elon Alas [facility] 10/25/2023, 8:36 AM

## 2023-10-25 NOTE — Discharge Summary (Signed)
Physician Discharge Summary  Dawn Moon GNF:[id number] DOB: 1956-11-21 DOA: 10/24/2023  PCP: April Manson, NP  Admit date: 10/24/2023 Discharge date: 10/25/2023  Admitted From: Home Discharge disposition: Home with outpatient speech therapy  Recommendations at discharge:  You been switched from aspirin to DAPT (aspirin and Plavix) for 3 weeks.  After that, switch to Plavix only. Follow-up with neurology as an outpatient Cardiology team will mail you a 30-day heart monitor    Brief narrative: Dawn Moon is a 67 y.o. female with PMH significant for NASH cirrhosis s/p liver transplant 05/2022 on immunosuppressants,; HTN, HLD (statin intolerant), PSVT, GERD, IBS with chronic diarrhea, hypomagnesemia, osteoarthritis, osteoporosis 11/3, patient presented to the ED with complaint of slurred speech for more than 24 hours. Previous day 11/2, patient noticed slurred speech when she woke up.  Family encouraged her to go to the ED.  She felt her symptoms were minor and did not present until the next day with persistent symptoms.  Of note, patient also reports a few episodes of palpitations in the preceding few days.  In the ED, patient was afebrile, heart rate in 60s, blood pressure 156/87, breathing on room air Initial labs unremarkable except for BUN/creatinine elevated 28/1.32 Urinalysis showed hazy yellow urine with trace leukocytes, few bacteria Blood alcohol level not elevated CT head unremarkable MRI brain showed acute to early subacute infarct in the right corona radiata in the MCA distribution without hemorrhage or mass effect.  There were additional small remote infarcts and background chronic small-vessel ischemic change as above. Seen by neurology. Admitted to [facility] for stroke workup. MRA brain unremarkable  Subjective: Patient was seen and examined this morning.  Pleasant elderly Caucasian female. Seen by neurology, PT/OT/ST Chart reviewed. Blood pressure  in 130s this morning Labs with slight improvement in creatinine to 1.24  Assessment and plan: Acute right corona radiata stroke Presented with slurred speech, left facial droop for more than 24 hours duration MRI as above with acute infarct Per neurology, it is likely lacunar stroke secondary to small vessel. Stroke workup completed. MRA brain unremarkable Echocardiogram with EF 60 to 65%, no WMA, grade 1 diastolic dysfunction A1c 5.3, HDL 53, LDL 98, TSH 1.46 PTA, on aspirin 81 mg daily. Neurology recommended 3 weeks of aspirin and Plavix followed by Plavix alone.   Statin intolerant.  Discussed with cardiology team.  They will mail a 30-day heart monitor  H/o Elita Boone cirrhosis s/p liver transplant 05/2022 Continue immunosuppressant medicines: CellCept, Prograf, prednisone Continue all  Hypertension  PTA meds- metoprolol 25 mg twice daily.  Continue same.   IBS with chronic diarrhea   Patient was recently started a FODMAP diet which is helping.   She does still use Lomotil as needed  Wounds:  -    Discharge Exam:   Vitals:   10/24/23 2340 10/25/23 0408 10/25/23 0811 10/25/23 1133  BP: (!) 131/101 126/84 134/81 (!) 150/82  Pulse: 69 78 72 66  Resp:  17 18 18   Temp: 97.9 F (36.6 C) 97.6 F (36.4 C) 97.7 F (36.5 C) 98.2 F (36.8 C)  TempSrc: Oral Oral Oral Oral  SpO2: 95% 96% 97% 99%  Weight:    77.7 kg    Body mass index is 30.36 kg/m.  General exam: Pleasant, elderly Caucasian female.  Not in distress Skin: No rashes, lesions or ulcers. HEENT: Atraumatic, normocephalic, no obvious bleeding Lungs: Clear to auscultation bilaterally CVS: Regular rate and rhythm, no murmur GI/Abd: soft, nontender, nondistended, bowel sound present CNS:  Alert, awake, oriented x 3.  Speech remains garbled Psychiatry: Mood appropriate Extremities: No pedal edema, no calf tenderness  Follow ups:    Follow-up Information     April Manson, NP Follow up.   Specialty: Family  Medicine Contact information: [address] [place] [address] [phone]                 Discharge Instructions:   Discharge Instructions     Call MD for:  difficulty breathing, headache or visual disturbances   Complete by: As directed    Call MD for:  extreme fatigue   Complete by: As directed    Call MD for:  hives   Complete by: As directed    Call MD for:  persistant dizziness or light-headedness   Complete by: As directed    Call MD for:  persistant nausea and vomiting   Complete by: As directed    Call MD for:  severe uncontrolled pain   Complete by: As directed    Call MD for:  temperature >100.4   Complete by: As directed    Diet general   Complete by: As directed    Discharge instructions   Complete by: As directed    Recommendations at discharge:   You been switched from aspirin to DAPT (aspirin and Plavix) for 3 weeks.  After that, switch to Plavix only.  Follow-up with neurology as an outpatient  Cardiology team will mail you a 30-day heart monitor  General discharge instructions: Follow with Primary MD April Manson, NP in 7 days  Please request your PCP  to go over your hospital tests, procedures, radiology results at the follow up. Please get your medicines reviewed and adjusted.  Your PCP may decide to repeat certain labs or tests as needed. Do not drive, operate heavy machinery, perform activities at heights, swimming or participation in water activities or provide baby sitting services if your were admitted for syncope or siezures until you have seen by Primary MD or a Neurologist and advised to do so again. [place] Controlled Substance Reporting System database was reviewed. Do not drive, operate heavy machinery, perform activities at heights, swim, participate in water activities or provide baby-sitting services while on medications for pain, sleep and mood until your outpatient physician has reevaluated you and advised to  do so again.  You are strongly recommended to comply with the dose, frequency and duration of prescribed medications. Activity: As tolerated with Full fall precautions use walker/cane & assistance as needed Avoid using any recreational substances like cigarette, tobacco, alcohol, or non-prescribed drug. If you experience worsening of your admission symptoms, develop shortness of breath, life threatening emergency, suicidal or homicidal thoughts you must seek medical attention immediately by calling 911 or calling your MD immediately  if symptoms less severe. You must read complete instructions/literature along with all the possible adverse reactions/side effects for all the medicines you take and that have been prescribed to you. Take any new medicine only after you have completely understood and accepted all the possible adverse reactions/side effects.  Wear Seat belts while driving. You were cared for by a hospitalist during your hospital stay. If you have any questions about your discharge medications or the care you received while you were in the hospital after you are discharged, you can call the unit and ask to speak with the hospitalist or the covering physician. Once you are discharged, your primary care physician will handle any further medical  issues. Please note that NO REFILLS for any discharge medications will be authorized once you are discharged, as it is imperative that you return to your primary care physician (or establish a relationship with a primary care physician if you do not have one).   Increase activity slowly   Complete by: As directed        Discharge Medications:   Allergies as of 10/25/2023       Reactions   Atorvastatin Other (See Comments)   Muscle pain and cramps   Rosuvastatin Other (See Comments)   Muscle pain and cramps        Medication List     TAKE these medications    aspirin EC 81 MG tablet Commonly known as: Aspirin 81 Take 1 tablet (81 mg  total) by mouth daily for 21 days.   Caltrate 600+D Plus Minerals 600-800 MG-UNIT Tabs Take 2 tablets by mouth in the morning and at bedtime.   clopidogrel 75 MG tablet Commonly known as: PLAVIX Take 1 tablet (75 mg total) by mouth daily.   diphenhydramine-acetaminophen 25-500 MG Tabs tablet Commonly known as: TYLENOL PM Take 1 tablet by mouth at bedtime.   diphenoxylate-atropine 2.5-0.025 MG tablet Commonly known as: LOMOTIL Take 1 tablet by mouth every 6 (six) hours as needed for diarrhea or loose stools.   ezetimibe 10 MG tablet Commonly known as: ZETIA Take 1 tablet (10 mg total) by mouth daily.   GNP Melatonin 3 MG Tabs tablet Generic drug: melatonin Take 6 mg by mouth at bedtime. 2 at bedtime   lactobacillus acidophilus Tabs tablet Take 2 tablets by mouth 3 (three) times daily.   metoprolol tartrate 25 MG tablet Commonly known as: LOPRESSOR Take 1 tablet (25 mg total) by mouth 2 (two) times daily.   MG Plus Protein 133 MG Tabs Take 3 tablets by mouth in the morning and at bedtime.   mycophenolate 250 MG capsule Commonly known as: CELLCEPT Take 250 mg by mouth 2 (two) times daily.   Prolia 60 MG/ML Sosy injection Generic drug: denosumab   tacrolimus 1 MG capsule Commonly known as: PROGRAF Take 4 capsules (4 mg total) by mouth every 12 (twelve) hours. What changed:  how much to take additional instructions   VITAMIN D3 PO Take 1 capsule by mouth daily.         The results of significant diagnostics from this hospitalization (including imaging, microbiology, ancillary and laboratory) are listed below for reference.    Procedures and Diagnostic Studies:   VAS US CAROTID  Result Date: 10/25/2023 Carotid Arterial Duplex Study Patient Name:  Dawn Moon  Date of Exam:   10/25/2023 Medical Rec #: [id number]               Accession #:    [id number] Date of Birth: 1956-04-17               Patient Gender: F Patient Age:   62 years Exam Location:   [facility] Procedure:      VAS US CAROTID Referring Phys: Brooke Dare --------------------------------------------------------------------------------  Indications:       CVA and Speech disturbance. Risk Factors:      Hypertension. Comparison Study:  No prior exam Performing Technologist: Fernande Bras  Examination Guidelines: A complete evaluation includes B-mode imaging, spectral Doppler, color Doppler, and power Doppler as needed of all accessible portions of each vessel. Bilateral testing is considered an integral part of a complete examination. Limited examinations for reoccurring indications may  be performed as noted.  Right Carotid Findings: +----------+--------+--------+--------+-----------------------+--------+           PSV cm/sEDV cm/sStenosisPlaque Description     Comments +----------+--------+--------+--------+-----------------------+--------+ CCA Prox  129     28                                              +----------+--------+--------+--------+-----------------------+--------+ CCA Distal108     34                                              +----------+--------+--------+--------+-----------------------+--------+ ICA Prox  92      24              smooth and heterogenous         +----------+--------+--------+--------+-----------------------+--------+ ICA Mid   66      23                                              +----------+--------+--------+--------+-----------------------+--------+ ICA Distal79      28                                              +----------+--------+--------+--------+-----------------------+--------+ ECA       117     19                                              +----------+--------+--------+--------+-----------------------+--------+ +----------+--------+-------+--------+-------------------+           PSV cm/sEDV cmsDescribeArm Pressure (mmHG) +----------+--------+-------+--------+-------------------+  Subclavian174     18                                 +----------+--------+-------+--------+-------------------+ +---------+--------+--+--------+--+ VertebralPSV cm/s56EDV cm/s18 +---------+--------+--+--------+--+  Left Carotid Findings: +----------+--------+--------+--------+------------------+--------+           PSV cm/sEDV cm/sStenosisPlaque DescriptionComments +----------+--------+--------+--------+------------------+--------+ CCA Prox  137     27                                         +----------+--------+--------+--------+------------------+--------+ CCA Distal114     26                                         +----------+--------+--------+--------+------------------+--------+ ICA Prox  66      24                                         +----------+--------+--------+--------+------------------+--------+ ICA Mid   76      32                                         +----------+--------+--------+--------+------------------+--------+  ICA Distal50      17                                         +----------+--------+--------+--------+------------------+--------+ ECA       103     20                                         +----------+--------+--------+--------+------------------+--------+ +----------+--------+--------+--------+-------------------+           PSV cm/sEDV cm/sDescribeArm Pressure (mmHG) +----------+--------+--------+--------+-------------------+ Subclavian108     13                                  +----------+--------+--------+--------+-------------------+ +---------+--------+--+--------+--+ VertebralPSV cm/s53EDV cm/s13 +---------+--------+--+--------+--+   Summary: Right Carotid: Velocities in the right ICA are consistent with a 1-39% stenosis.                The ECA appears <50% stenosed. Left Carotid: Velocities in the left ICA are consistent with a 1-39% stenosis.               The ECA appears <50% stenosed. The  extracranial vessels were               near-normal with only minimal wall thickening or plaque. Vertebrals:  Bilateral vertebral arteries demonstrate antegrade flow. Subclavians: Normal flow hemodynamics were seen in bilateral subclavian              arteries. *See table(s) above for measurements and observations.  Electronically signed by Sherald Hess MD on 10/25/2023 at 12:09:10 PM.    Final    ECHOCARDIOGRAM COMPLETE  Result Date: 10/25/2023    ECHOCARDIOGRAM REPORT   Patient Name:   Dawn Moon Date of Exam: 10/25/2023 Medical Rec #:  [id number]              Height:       63.0 in Accession #:    [id number]             Weight:       169.0 lb Date of Birth:  1956/04/28              BSA:          1.800 m Patient Age:    67 years               BP:           126/84 mmHg Patient Gender: F                      HR:           77 bpm. Exam Location:  Inpatient Procedure: 2D Echo, Cardiac Doppler and Color Doppler Indications:    TIA  History:        Patient has prior history of Echocardiogram examinations, most                 recent 09/05/2020. TIA; Risk Factors:Hypertension.  Sonographer:    Darlys Gales Referring Phys: [id number] SRISHTI L BHAGAT IMPRESSIONS  1. Left ventricular ejection fraction, by estimation, is 60 to 65%. The left ventricle has normal function. The left ventricle has no regional wall motion abnormalities. Left ventricular diastolic parameters are consistent with Grade  I diastolic dysfunction (impaired relaxation).  2. Right ventricular systolic function is normal. The right ventricular size is normal.  3. The mitral valve is normal in structure. Trivial mitral valve regurgitation. No evidence of mitral stenosis.  4. The aortic valve is normal in structure. There is mild calcification of the aortic valve. Aortic valve regurgitation is not visualized. Aortic valve sclerosis/calcification is present, without any evidence of aortic stenosis. FINDINGS  Left Ventricle: Left ventricular  ejection fraction, by estimation, is 60 to 65%. The left ventricle has normal function. The left ventricle has no regional wall motion abnormalities. The left ventricular internal cavity size was normal in size. There is  no left ventricular hypertrophy. Left ventricular diastolic parameters are consistent with Grade I diastolic dysfunction (impaired relaxation). Right Ventricle: The right ventricular size is normal. No increase in right ventricular wall thickness. Right ventricular systolic function is normal. Left Atrium: Left atrial size was normal in size. Right Atrium: Right atrial size was normal in size. Pericardium: There is no evidence of pericardial effusion. Mitral Valve: The mitral valve is normal in structure. Trivial mitral valve regurgitation. No evidence of mitral valve stenosis. Tricuspid Valve: The tricuspid valve is normal in structure. Tricuspid valve regurgitation is not demonstrated. No evidence of tricuspid stenosis. Aortic Valve: The aortic valve is normal in structure. There is mild calcification of the aortic valve. Aortic valve regurgitation is not visualized. Aortic valve sclerosis/calcification is present, without any evidence of aortic stenosis. Aortic valve mean gradient measures 3.0 mmHg. Aortic valve peak gradient measures 5.5 mmHg. Aortic valve area, by VTI measures 3.10 cm. Pulmonic Valve: The pulmonic valve was normal in structure. Pulmonic valve regurgitation is not visualized. No evidence of pulmonic stenosis. Aorta: The aortic root is normal in size and structure. Venous: The inferior vena cava was not well visualized. IAS/Shunts: No atrial level shunt detected by color flow Doppler.  LEFT VENTRICLE PLAX 2D LVOT diam:     2.00 cm   Diastology LV SV:         70        LV e' medial:    4.79 cm/s LV SV Index:   39        LV E/e' medial:  15.3 LVOT Area:     3.14 cm  LV e' lateral:   7.94 cm/s                          LV E/e' lateral: 9.2  RIGHT VENTRICLE RV S prime:     11.70  cm/s TAPSE (M-mode): 1.8 cm LEFT ATRIUM             Index        RIGHT ATRIUM          Index LA Vol (A2C):   22.6 ml 12.56 ml/m  RA Area:     9.30 cm LA Vol (A4C):   39.4 ml 21.89 ml/m  RA Volume:   18.80 ml 10.44 ml/m LA Biplane Vol: 30.8 ml 17.11 ml/m  AORTIC VALVE AV Area (Vmax):    2.95 cm AV Area (Vmean):   2.76 cm AV Area (VTI):     3.10 cm AV Vmax:           117.00 cm/s AV Vmean:          77.400 cm/s AV VTI:            0.227 m AV Peak Grad:      5.5 mmHg  AV Mean Grad:      3.0 mmHg LVOT Vmax:         110.00 cm/s LVOT Vmean:        67.900 cm/s LVOT VTI:          0.224 m LVOT/AV VTI ratio: 0.99  AORTA Ao Root diam: 2.70 cm MITRAL VALVE MV Area (PHT): 3.99 cm    SHUNTS MV Decel Time: 190 msec    Systemic VTI:  0.22 m MV E velocity: 73.20 cm/s  Systemic Diam: 2.00 cm MV A velocity: 75.90 cm/s MV E/A ratio:  0.96 Arvilla Meres MD Electronically signed by Arvilla Meres MD Signature Date/Time: 10/25/2023/10:47:07 AM    Final    MR ANGIO HEAD WO CONTRAST  Result Date: 10/24/2023 CLINICAL DATA:  Initial evaluation for stroke/TIA, determine embolic source. EXAM: MRA HEAD WITHOUT CONTRAST TECHNIQUE: Angiographic images of the Circle of Willis were acquired using MRA technique without intravenous contrast. COMPARISON:  Prior brain MRI from earlier the same day. FINDINGS: Anterior circulation: Both internal carotid arteries are widely patent to the termini without stenosis or other abnormality. A1 segments patent bilaterally. Normal anterior communicating artery complex. Anterior cerebral arteries patent without stenosis. No M1 stenosis or occlusion. No proximal MCA branch occlusion or high-grade stenosis. Distal MCA branches perfused and symmetric. Posterior circulation: Both V4 segments widely patent without stenosis. Right vertebral artery dominant. Neither PICA origin well visualized. Basilar widely patent without stenosis. Superior cerebellar and posterior cerebral arteries widely patent  bilaterally. Anatomic variants: None significant. Other: No intracranial aneurysm or other vascular malformation. IMPRESSION: Normal intracranial MRA. Electronically Signed   By: Rise Mu M.D.   On: 10/24/2023 22:24   MR BRAIN WO CONTRAST  Result Date: 10/24/2023 CLINICAL DATA:  Neuro deficit, stroke suspected. Left facial droop with dysarthria. EXAM: MRI HEAD WITHOUT CONTRAST TECHNIQUE: Multiplanar, multiecho pulse sequences of the brain and surrounding structures were obtained without intravenous contrast. COMPARISON:  Same-day CT head FINDINGS: Brain: There is a small acute to early subacute infarct in the right corona radiata without hemorrhage or mass effect. There is no acute intracranial hemorrhage or extra-axial fluid collection. Background parenchymal volume is normal. The ventricles are normal in size. There are additional small remote infarcts in the bilateral parietal white matter and background chronic small-vessel ischemic change The pituitary and suprasellar region are normal. There is no mass lesion. There is no mass effect or midline shift. Vascular: Normal flow voids. Skull and upper cervical spine: Normal marrow signal. Sinuses/Orbits: The paranasal sinuses are clear. The globes and orbits are unremarkable. Other: The mastoid air cells and middle ear cavities are clear. IMPRESSION: 1. Acute to early subacute infarct in the right corona radiata in the MCA distribution without hemorrhage or mass effect. 2. Additional small remote infarcts and background chronic small-vessel ischemic change as above. Electronically Signed   By: Lesia Hausen M.D.   On: 10/24/2023 17:53   CT HEAD WO CONTRAST  Result Date: 10/24/2023 CLINICAL DATA:  Left-sided lower facial droop with some dysarthria for 2 days. EXAM: CT HEAD WITHOUT CONTRAST TECHNIQUE: Contiguous axial images were obtained from the base of the skull through the vertex without intravenous contrast. RADIATION DOSE REDUCTION: This exam  was performed according to the departmental dose-optimization program which includes automated exposure control, adjustment of the mA and/or kV according to patient size and/or use of iterative reconstruction technique. COMPARISON:  Head CT 11/11/2022 FINDINGS: Brain: No evidence of acute infarction, hemorrhage, hydrocephalus, extra-axial collection or mass lesion/mass effect. Low densities  in the cerebral white matter attributed to chronic small vessel disease with fairly defined and chronic appearing lacune so. There has been progression from 2023. Vascular: No hyperdense vessel or unexpected calcification. Skull: Normal. Negative for fracture or focal lesion. Sinuses/Orbits: No acute finding. IMPRESSION: 1. No acute finding. 2. Chronic small vessel disease with progression from 2023. Electronically Signed   By: Tiburcio Pea M.D.   On: 10/24/2023 10:42     Labs:   Basic Metabolic Panel: Recent Labs  Lab 10/24/23 0958 10/25/23 0622  NA 138 138  K 4.1 4.7  CL 103 104  CO2 26 26  GLUCOSE 89 100*  BUN 28* 18  CREATININE 1.32* 1.24*  CALCIUM 9.8 9.5   GFR Estimated Creatinine Clearance: 43.4 mL/min (A) (by C-G formula based on SCr of 1.24 mg/dL (H)). Liver Function Tests: Recent Labs  Lab 10/24/23 0958  AST 18  ALT 13  ALKPHOS 45  BILITOT 0.8  PROT 7.0  ALBUMIN 4.6   No results for input(s): "LIPASE", "AMYLASE" in the last 168 hours. No results for input(s): "AMMONIA" in the last 168 hours. Coagulation profile Recent Labs  Lab 10/24/23 0958  INR 0.9    CBC: Recent Labs  Lab 10/24/23 0958 10/25/23 0622  WBC 4.4 3.5*  NEUTROABS 2.4  --   HGB 12.5 12.3  HCT 38.5 38.0  MCV 78.3* 79.5*  PLT 199 173   Cardiac Enzymes: No results for input(s): "CKTOTAL", "CKMB", "CKMBINDEX", "TROPONINI" in the last 168 hours. BNP: Invalid input(s): "POCBNP" CBG: No results for input(s): "GLUCAP" in the last 168 hours. D-Dimer No results for input(s): "DDIMER" in the last 72  hours. Hgb A1c Recent Labs    10/25/23 0622  HGBA1C 5.3   Lipid Profile Recent Labs    10/25/23 0622  CHOL 217*  HDL 53  LDLCALC 98  TRIG 332*  CHOLHDL 4.1   Thyroid function studies Recent Labs    10/25/23 0622  TSH 1.465   Anemia work up No results for input(s): "VITAMINB12", "FOLATE", "FERRITIN", "TIBC", "IRON", "RETICCTPCT" in the last 72 hours. Microbiology No results found for this or any previous visit (from the past 240 hour(s)).  Time coordinating discharge: 45 minutes  Signed: Tine Mabee  [facility] 10/25/2023, 1:55 PM

## 2023-10-25 NOTE — Evaluation (Signed)
Physical Therapy Brief Evaluation and Discharge Note Patient Details Name: Dawn Moon MRN: [id number] DOB: December 27, 1955 Today's Date: 10/25/2023   History of Present Illness  67 y.o. female presents to ED with greater than 24 hours of slurred speech.MRI revealed an acute right corona radiata CVA. TDD:UKGUR transplant 05/2022 secondary to NASH at [facility], osteoporosis, CKD, IBD with chronic diarrhea, hypertension, hypomagnesemia  Clinical Impression  Pt independent prior to hospitalization and from mobility standpoint remains independent. Family available to assist PRN. Pt has no PT or equipment needs at discharge. PT signing off        PT Assessment Patient does not need any further PT services  Assistance Needed at Discharge  PRN    Equipment Recommendations None recommended by PT     Precautions/Restrictions Precautions Precautions: Fall Restrictions Weight Bearing Restrictions: No        Mobility  Bed Mobility   Supine/Sidelying to sit: Modified independent (Device/Increased time) Sit to supine/sidelying: Independent General bed mobility comments: mild use of bed rails to pull to EoB  Transfers Overall transfer level: Independent                 General transfer comment: good power up and self steady, no dizzy lightheaded               Modified Rankin (Stroke Patients Only) Modified Rankin (Stroke Patients Only) Pre-Morbid Rankin Score: No symptoms Modified Rankin: No significant disability      Balance Overall balance assessment: Independent (ambulates with head turns, speed change and quick turns without impairment)                        Pertinent Vitals/Pain PT - Brief Vital Signs All Vital Signs Stable: Yes Pain Assessment Pain Assessment: No/denies pain     Home Living   Living Arrangements: Spouse/significant other       Home Equipment: Grab bars - tub/shower;Hand held shower head;Rollator (4  wheels);Crutches   Additional Comments: Owns a rollator, however was not using it- She keeps a cane in the car to use as she fatigues.    Prior Function Level of Independence: Independent      UE/LE Assessment   UE ROM/Strength/Tone/Coordination: WFL    LE ROM/Strength/Tone/Coordination: [facility]      Communication   Communication Communication: Difficulty communicating thoughts/reduced clarity of speech     Cognition Overall Cognitive Status: Appears within functional limits for tasks assessed/performed       General Comments General comments (skin integrity, edema, etc.): Family present during session, pt reports some back discomfort from laying in bed    Exercises     Assessment/Plan    PT Problem List         PT Visit Diagnosis Other symptoms and signs involving the nervous system (R29.898)    No Skilled PT All education completed;Patient at baseline level of functioning;Patient is independent with all acitivity/mobility    AMPAC 6 Clicks Help needed turning from your back to your side while in a flat bed without using bedrails?: None Help needed moving from lying on your back to sitting on the side of a flat bed without using bedrails?: None Help needed moving to and from a bed to a chair (including a wheelchair)?: None Help needed standing up from a chair using your arms (e.g., wheelchair or bedside chair)?: None Help needed to walk in hospital room?: None Help needed climbing 3-5 steps with a railing? : None 6 Click Score:  24      End of Session Equipment Utilized During Treatment: Gait belt Activity Tolerance: Patient tolerated treatment well Patient left: in bed;Other (comment) (ECHO present) Nurse Communication: Mobility status PT Visit Diagnosis: Other symptoms and signs involving the nervous system (W29.562)     Time: 1308-6578 PT Time Calculation (min) (ACUTE ONLY): 21 min  Charges:   PT Evaluation $PT Eval Low Complexity: 1 Low       Hendry Speas B. Beverely Risen PT, DPT Acute Rehabilitation Services Please use secure chat or  Call Office [phone]   Elon Alas Fleet  10/25/2023, 11:09 AM

## 2023-10-25 NOTE — Progress Notes (Signed)
Initial Nutrition Assessment  DOCUMENTATION CODES:   Obesity unspecified  INTERVENTION:  Continue with current diet Provide multivitamin with minerals daily   NUTRITION DIAGNOSIS:   Increased nutrient needs related to chronic illness as evidenced by estimated needs.    GOAL:   Patient will meet greater than or equal to 90% of their needs    MONITOR:   PO intake, Labs, Weight trends, Skin  REASON FOR ASSESSMENT:   Consult Assessment of nutrition requirement/status  ASSESSMENT:67 y.o. F, came in from home with stroke like symptoms, MRI revealed acute right corona radiata CVA.   Pmh; Liver transplant 05/2022 secondary to NASH at [facility]. Osteoporosis, CKD, IBD, HTN, Hypomagnesemia. Patient walking in room at time of visit.  Husband and middle daughter in room.  Patient stated that she has had no appetite changes nor weight changes.  She did state that since stroke she is now pocketing foods, SLP working with. She reports ~ 25% of breakfast this morning and, 15% of last night meals.  Stated last BM this morning.  She is anticipating discharge today.  Deferred receiving supplements as she has never tried them and does not want to irritate IBD. Stated UBW around 160#.  Updated weight pending RN.   Suspect patient would benefit from MVI.   Current weight: 76.7 gk (09/06/23)  Weight history: 09/06/23 76.7 kg  11/18/22 73 kg  11/12/22 74.1 kg  10/29/22 75.8 kg   Average Meal Intake: No current documentation at this time.   Nutritionally Relevant Medications: Scheduled Meds:  mycophenolate  250 mg Oral BID   tacrolimus  3 mg Oral q AM   And   tacrolimus  2 mg Oral QHS    Labs Reviewed:  CBG ranges from 80-100 mg/dL over the last 24 hours    NUTRITION - FOCUSED PHYSICAL EXAM:  Flowsheet Row Most Recent Value  Orbital Region No depletion  Upper Arm Region No depletion  Thoracic and Lumbar Region No depletion  Temple Region Mild depletion  Clavicle Bone  Region No depletion  Clavicle and Acromion Bone Region No depletion  Scapular Bone Region No depletion  Dorsal Hand No depletion  Patellar Region No depletion  Anterior Thigh Region No depletion  Posterior Calf Region No depletion  Edema (RD Assessment) None  Hair Reviewed  Eyes Reviewed  Mouth Reviewed  Skin Reviewed  Nails Reviewed       Diet Order:   Diet Order             Diet regular Room service appropriate? Yes; Fluid consistency: Thin  Diet effective now                   EDUCATION NEEDS:   Education needs have been addressed  Skin:  Skin Assessment: Reviewed RN Assessment  Last BM:  PTA  Height:   Ht Readings from Last 1 Encounters:  09/06/23 5\' 3"  (1.6 m)    Weight:   Wt Readings from Last 1 Encounters:  09/06/23 76.7 kg    Ideal Body Weight:     BMI:  There is no height or weight on file to calculate BMI.  Estimated Nutritional Needs:   Kcal:  1600-1950 kcal/d  Protein:  100-115 g/day  Fluid:  69ml/kcal    Jamelle Haring RDN, LDN Clinical Dietitian  RDN pager # available on Amion

## 2023-10-26 MED ORDER — EZETIMIBE 10 MG PO TABS: 10.0000 mg | ORAL_TABLET | Freq: Every day | ORAL | 3 refills | Status: AC

## 2023-10-26 NOTE — Telephone Encounter (Signed)
Spoke with pt, aware that monitor will be mailed to her. Aware prescription for the ezetimibe has been sent to the pharmacy. Aware of follow up appointment.

## 2023-10-28 ENCOUNTER — Telehealth: Payer: Self-pay

## 2023-10-28 NOTE — Patient Outreach (Signed)
  Emmi Stroke Care Coordination Follow Up  10/28/2023 Name:  Dawn Moon MRN:  [id number] DOB:  13-Jun-1956  Subjective: Dawn Moon is a 67 y.o. year old female who is a primary care patient of White, Bonnell [employer], NP An Emmi alert was received indicating patient responded to questions: Scheduled a follow-up appointment?. I reached out by phone to follow up on the alert and spoke to Patient. Patient has since scheduled follow up appointments with PCP and Neurologist.  PCP scheduled next week and Neurologist in February.  Care Coordination Interventions:  Yes, provided   Follow up plan: No further intervention required.   Encounter Outcome:  Patient Visit Completed   Bary Leriche, RN, MSN [facility]  [facility], [facility] Management Community Coordinator Direct Dial: [phone]  Fax: [phone] Website: [email or web address]

## 2023-10-28 NOTE — Patient Outreach (Signed)
Received a red flag Emmi stroke notification. I have assigned Roshanda Florance, RN to call for follow up and determine if there are any Case Management needs.    Laura Greeson, CBCS, CMAA [facility] Care Management Assistant [facility] Care Management [phone]  

## 2023-10-28 NOTE — Telephone Encounter (Signed)
This encounter was created in error - please disregard.

## 2023-11-02 ENCOUNTER — Ambulatory Visit: Payer: Medicare Other | Attending: Internal Medicine

## 2023-11-02 ENCOUNTER — Other Ambulatory Visit: Payer: Self-pay

## 2023-11-02 DIAGNOSIS — I639 Cerebral infarction, unspecified: Secondary | ICD-10-CM

## 2023-11-02 DIAGNOSIS — R1311 Dysphagia, oral phase: Secondary | ICD-10-CM | POA: Insufficient documentation

## 2023-11-02 DIAGNOSIS — R471 Dysarthria and anarthria: Secondary | ICD-10-CM | POA: Insufficient documentation

## 2023-11-02 NOTE — Patient Instructions (Addendum)
   Perform these exercises twice to three times per day, 15 repetitions each Tongue to roof of mouth and press hard  Tongue to left cheek and press with finger  Smile as far back as you can and pucker as far forward as you can  Sweep your tongue in your left pocket ====================================  Speech Exercises  Repeat these phrases 2 times, twice a day  Call the cat "Buttercup" A calendar of [place] Four floors to cover Yellow oil ointment Fellow lovers of felines Catastrophe in [place] Plump plumbers' plums The church's chimes chimed Telling time 'til eleven Five valve levers Keep the gate closed Go see that guy Fat cows give milk Automatic Data Gophers Fat frogs flip freely [employer] into bed Get that game to [employer] Thick thistles stick together Cinnamon aluminum linoleum Black bugs blood Lovely lemon linament Red leather, yellow leather  Big grocery buggy    Purple baby carriage [facility] [place] copper coffee pot Ripe purple cabbage Three free throws [employer] tackled  [employer] dipped the dessert  [employer] Buckle that [employer] [facility] [place] Shirts shrink, shells shouldn't [place] [employer] Take the tackle box File the flash message Give me five flapjacks Fundamental relatives Dye the pets purple Talking [place] time after time Dark chocolate chunks Political landscape of the kingdom Actuary genius We played yo-yos yesterday

## 2023-11-02 NOTE — Therapy (Unsigned)
OUTPATIENT SPEECH LANGUAGE PATHOLOGY EVALUATION   Patient Name: Dawn Moon MRN: [id number] DOB:11-13-1956, 67 y.o., female Today's Date: 11/03/2023  PCP: Dawn Moon, Dawn Ewings, NP REFERRING PROVIDER: Lorin Glass, MD  END OF SESSION:  End of Session - 11/03/23 1400     Visit Number 1    Number of Visits 7    Date for SLP Re-Evaluation 12/17/23    SLP Start Time 1318    SLP Stop Time  1400    SLP Time Calculation (min) 42 min    Activity Tolerance Patient tolerated treatment well             Past Medical History:  Diagnosis Date   Ascites    Dysrhythmia    hx of SVT   Encephalopathy, hepatic (HCC) 05/13/2018   GERD (gastroesophageal reflux disease)    History of exercise stress test    03-07-2010  Stress echo--- no arrhythmias or conduction abnormalilites and negative for ischemia or chest pain   History of kidney stones    History of paroxysmal supraventricular tachycardia    episode 2011  consult w/ dr Dawn Moon --  put on atenolol--  per pt no longer an issue   HTN (hypertension)    cardiologist --  dr Dawn Moon   IBS (irritable bowel syndrome)    diarrhea   Nonalcoholic steatohepatitis (NASH)    Numbness and tingling of left lower extremity    post achilles tendon repair   OA (osteoarthritis)    knees   Osteoporosis    Pleural effusion on right    hepatic hydrothorax   PMB (postmenopausal bleeding) none recent   thickened endometrium   PONV (postoperative nausea and vomiting)    Recurrent UTI none since 2019   Secondary esophageal varices without bleeding (HCC) 06/03/2018   Vitamin D deficiency    Wears glasses    Past Surgical History:  Procedure Laterality Date   ACHILLES TENDON SURGERY Left 06/2016   BIOPSY  11/25/2021   Procedure: BIOPSY;  Surgeon: Dawn Boop, MD;  Location: [facility];  Service: Endoscopy;;   BREAST BIOPSY Left 02/2016   benign   CARDIAC CATHETERIZATION     COLONOSCOPY  2005   COLONOSCOPY WITH PROPOFOL N/A  11/25/2021   Procedure: COLONOSCOPY WITH PROPOFOL;  Surgeon: Dawn Boop, MD;  Location: [facility];  Service: Endoscopy;  Laterality: N/A;   CT CTA CORONARY W/CA SCORE W/CM &/OR WO/CM  01/01/2014   non-obstructive calcified plaque in pLAD (0-25%), no significant incidental noncardiac findings noted   DILATATION & CURETTAGE/HYSTEROSCOPY WITH MYOSURE N/A 12/02/2020   Procedure: DILATATION & CURETTAGE/HYSTEROSCOPY WITH POLYPECTOMY AND REMOVAL OF VAGINAL LESION;  Surgeon: Dawn Majors, MD;  Location: [facility];  Service: Gynecology;  Laterality: N/A;  request to follow in [facility] iqueue held (DR. Penni Moon has two other cases that morning starting 7:30am)   ESOPHAGOGASTRODUODENOSCOPY  01/2014   EXTRACORPOREAL SHOCK WAVE LITHOTRIPSY  2010   KNEE ARTHROPLASTY Right 03/24/2018   Procedure: RIGHT TOTAL KNEE ARTHROPLASTY WITH COMPUTER NAVIGATION;  Surgeon: Samson Frederic, MD;  Location: [facility];  Service: Orthopedics;  Laterality: Right;  Needs RNFA   KNEE ARTHROSCOPY Right 12/04/2016   Procedure: ARTHROSCOPY KNEE WITH PARTIAL MEDIAL MENISCECTOMY;  Surgeon: Samson Frederic, MD;  Location: [facility];  Service: Orthopedics;  Laterality: Right;   LEFT HEART CATH AND CORONARY ANGIOGRAPHY N/A 11/20/2021   Procedure: LEFT HEART CATH AND CORONARY ANGIOGRAPHY;  Surgeon: Kathleene Hazel, MD;  Location: [facility] CV [facility];  Service: Cardiovascular;  Laterality: N/A;   LEFT HEART CATHETERIZATION WITH CORONARY ANGIOGRAM N/A 01/11/2012   Procedure: LEFT HEART CATHETERIZATION WITH CORONARY ANGIOGRAM;  Surgeon: Tonny Bollman, MD;  Location: [facility] CATH LAB;  Service: Cardiovascular;  Laterality: N/A;  widely patent coronary arterires without significant obstructive CAD,  normal LVF, ef 55-56%   LIVER TRANSPLANTATION  06/03/2022   TONSILLECTOMY AND ADENOIDECTOMY  child   TRANSTHORACIC ECHOCARDIOGRAM  01/18/2012   mild LVH,  ef 60%/  trivial TR   TUBAL LIGATION  1985    UPPER GASTROINTESTINAL ENDOSCOPY  last done 2019   Patient Active Problem List   Diagnosis Date Noted   Stroke (HCC) 10/24/2023   Diarrhea 09/06/2023   Thoracic compression fracture (HCC) 11/18/2022   Splenic laceration 11/11/2022   Liver transplant status (HCC) 08/12/2022   Coronary artery disease involving native coronary artery of native heart without angina pectoris    Adnexal mass 10/25/2020   Elevated cancer antigen 125 (CA 125) 10/25/2020   Thickened endometrium 10/25/2020   Recurrent UTI 06/12/2019   Secondary insomnia 04/10/2019   Anemia 02/28/2019   Degeneration of lumbar intervertebral disc 08/06/2018   History of paroxysmal supraventricular tachycardia    Liver cirrhosis secondary to NASH (HCC) 04/23/2018   Irritable bowel syndrome-diarrhea predominant 06/15/2017   Gastroesophageal reflux disease 06/15/2017   Hyperlipidemia    Kidney stone    Essential hypertension 02/13/2010    ONSET DATE: 10/23/23   REFERRING DIAG:  I63.9 (ICD-10-CM) - Cerebrovascular accident (CVA), unspecified mechanism (HCC)    THERAPY DIAG:  Dysphagia, oral phase  Dysarthria and anarthria  Rationale for Evaluation and Treatment: Rehabilitation  SUBJECTIVE:   SUBJECTIVE STATEMENT: "I can put my tongue down in my cheek so I'm not pocketing." Pt accompanied by: significant other  PERTINENT HISTORY: Dawn Moon is a 67 y.o. female with medical history significant for liver transplant 05/2022 secondary to NASH at [facility], osteoporosis, CKD, IBD with chronic diarrhea, hypertension, hypomagnesemia who presents with greater than 24 hours of slurred speech.  The patient noticed slurred speech when she woke up 10/23/23. They came in to ED 10/24/23.  In the emergency department her workup included a CT which was unremarkable.  She then had an MRI which revealed an acute right corona radiata CVA.   PAIN:  Are you having pain? No  FALLS: Has patient fallen in last 6 months?   No  LIVING ENVIRONMENT: Lives with: lives with their spouse Lives in: House/apartment  PLOF:  Level of assistance: Independent with ADLs, Independent with IADLs Employment: On disability - cirrhosis  PATIENT GOALS: "Not have to think about swallowing (my secretions)."  OBJECTIVE:  Note: Objective measures were completed at Evaluation unless otherwise noted.  DIAGNOSTIC FINDINGS:  MRI 10/24/23: IMPRESSION: 1. Acute to early subacute infarct in the right corona radiata in the MCA distribution without hemorrhage or mass effect. 2. Additional small remote infarcts and background chronic small-vessel ischemic change as above.   COGNITION: Overall cognitive status: Within functional limits for tasks assessed  AUDITORY COMPREHENSION: Overall auditory comprehension: Appears intact  READING COMPREHENSION: Intact  EXPRESSION: verbal  VERBAL EXPRESSION: Level of generative/spontaneous verbalization: conversation Comments: Appears WNL for extent of evaluation today - mod complex conversation  WRITTEN EXPRESSION: Dominant hand: right Written expression: Appears intact  MOTOR SPEECH: Overall motor speech: impaired Level of impairment: Phrase Respiration: thoracic breathing and diaphragmatic/abdominal breathing Phonation: normal Resonance: WFL Articulation: Impaired: phrase Intelligibility: Intelligible Motor planning: Appears intact Motor speech errors: aware and consistent Effective technique: slow  rate and intention/mindfulness  ORAL MOTOR EXAMINATION: Overall status: Impaired: Labial: Left (Coordination) Lingual: Left (ROM, Strength, and Coordination)  CLINICAL SWALLOW ASSESSMENT:  To be performed next session PRN Comments: Pt reports pocketing is still common but she can clear residual from lt labial sulcus successfully with tongue at this time. No coughing reported with POs.  Pt completed a BSE in the hospital on 10/25/23, as follows: Continue regular diet and thin  liquids as tolerated given pt is aware of pocketing and able to clear oral residue. She is able to select softer items from menu and/or cut up items as needed.  No acute SLP services identified for swallowing. SLP reviewed lingual strengthening exercises pt can complete independently in effort to aid oral efficiency and reduce pocketing.   PATIENT REPORTED OUTCOME MEASURES (PROM): EAT-10: pt scored 2/40 indicating very little difficulty with eating currently, and Speech - abbreviated PROM and pt scored 9/30, indicating slight impact of speech intelligibility on daily tasks. ("Rarely" indicated for difficult to speak clearly, "a little bit" indicated for trouble speaking clearly, and being frustrated by speech difficulties.   TODAY'S TREATMENT:                                                                                                                                         DATE:  11/02/23: SLP reviewed pt's HEP with her in today's session. SLP also discussed compensations for her speech with pt/husband and encouraged her to cont with these successful strategies.  PATIENT EDUCATION: Education details: see "today's treatment" Person educated: Patient and Spouse Education method: Explanation, Demonstration, Verbal cues, and Handouts Education comprehension: verbalized understanding, returned demonstration, verbal cues required, and needs further education   GOALS: Goals reviewed with patient? Yes  SHORT TERM GOALS: Target date: 12/03/23  Pt will complete HEP with modified independence in 2 sessions Baseline: Goal status: INITIAL  2.  Pt will report an improvement in her speech clarity to score 7 on Short Form-Speech PROM Baseline:  Goal status: INITIAL  3.  Pt will score 0 or 1 on EAT-10 in ease of swallowing solids, to demo improvement with swallowing skills Baseline:  Goal status: INITIAL   LONG TERM GOALS: Target date: 01/07/24  Pt will improve Speech PROM compared to initial  administration Baseline:  Goal status: INITIAL  2.  Pt will complete HEP with modified independence in 2 sessions Baseline:  Goal status: INITIAL  3.  Pt will report an improvement in her speech clarity to score 6 on Short Form-Speech PROM Baseline:  Goal status: INITIAL   ASSESSMENT:  CLINICAL IMPRESSION: Patient is a 67 y.o. F who was seen today for assessment of speech and swallowing in light of lingering mild lingual weakness following a CVA earlier this month. Labial strength remains WNL, with some mild decr'd coordination in nonspeech tasks. Pt remains 100% intelligible. With swallowing skills, pt states she is not experiencing significant  difficulty currently (see "patient-related outcome measures" above).   OBJECTIVE IMPAIRMENTS: include dysarthria and dysphagia. These impairments are limiting patient from ADLs/IADLs, effectively communicating at home and in community, and safety when swallowing. Factors affecting potential to achieve goals and functional outcome are  none noted today . Patient will benefit from skilled SLP services to address above impairments and improve overall function.  REHAB POTENTIAL: Excellent  PLAN:  SLP FREQUENCY: 1x/week  SLP DURATION: 6 weeks  PLANNED INTERVENTIONS: 92526 Treatment of swallowing function, 92507 Treatment of speech (30 or 45 min) , Environmental controls, Internal/external aids, Oral motor exercises, Functional tasks, Multimodal communication approach, SLP instruction and feedback, Compensatory strategies, and Patient/family education    [facility], CCC-SLP 11/03/2023, 3:07 PM

## 2023-11-09 ENCOUNTER — Ambulatory Visit: Payer: Medicare Other

## 2023-11-09 DIAGNOSIS — R471 Dysarthria and anarthria: Secondary | ICD-10-CM

## 2023-11-09 DIAGNOSIS — R1311 Dysphagia, oral phase: Secondary | ICD-10-CM

## 2023-11-09 NOTE — Therapy (Signed)
OUTPATIENT SPEECH LANGUAGE PATHOLOGY EVALUATION   Patient Name: Dawn Moon MRN: [id number] DOB:1956-10-01, 67 y.o., female Today's Date: 11/09/2023  PCP: Kathlen Brunswick, Elbert Ewings, NP REFERRING PROVIDER: Lorin Glass, MD  END OF SESSION:  End of Session - 11/09/23 1025     Visit Number 2    Number of Visits 7    Date for SLP Re-Evaluation 12/17/23    SLP Start Time 1022    SLP Stop Time  1100    SLP Time Calculation (min) 38 min    Activity Tolerance Patient tolerated treatment well             Past Medical History:  Diagnosis Date   Ascites    Dysrhythmia    hx of SVT   Encephalopathy, hepatic (HCC) 05/13/2018   GERD (gastroesophageal reflux disease)    History of exercise stress test    03-07-2010  Stress echo--- no arrhythmias or conduction abnormalilites and negative for ischemia or chest pain   History of kidney stones    History of paroxysmal supraventricular tachycardia    episode 2011  consult w/ dr Graciela Husbands --  put on atenolol--  per pt no longer an issue   HTN (hypertension)    cardiologist --  dr Jens Som   IBS (irritable bowel syndrome)    diarrhea   Nonalcoholic steatohepatitis (NASH)    Numbness and tingling of left lower extremity    post achilles tendon repair   OA (osteoarthritis)    knees   Osteoporosis    Pleural effusion on right    hepatic hydrothorax   PMB (postmenopausal bleeding) none recent   thickened endometrium   PONV (postoperative nausea and vomiting)    Recurrent UTI none since 2019   Secondary esophageal varices without bleeding (HCC) 06/03/2018   Vitamin D deficiency    Wears glasses    Past Surgical History:  Procedure Laterality Date   ACHILLES TENDON SURGERY Left 06/2016   BIOPSY  11/25/2021   Procedure: BIOPSY;  Surgeon: Iva Boop, MD;  Location: [facility];  Service: Endoscopy;;   BREAST BIOPSY Left 02/2016   benign   CARDIAC CATHETERIZATION     COLONOSCOPY  2005   COLONOSCOPY WITH PROPOFOL N/A  11/25/2021   Procedure: COLONOSCOPY WITH PROPOFOL;  Surgeon: Iva Boop, MD;  Location: [facility];  Service: Endoscopy;  Laterality: N/A;   CT CTA CORONARY W/CA SCORE W/CM &/OR WO/CM  01/01/2014   non-obstructive calcified plaque in pLAD (0-25%), no significant incidental noncardiac findings noted   DILATATION & CURETTAGE/HYSTEROSCOPY WITH MYOSURE N/A 12/02/2020   Procedure: DILATATION & CURETTAGE/HYSTEROSCOPY WITH POLYPECTOMY AND REMOVAL OF VAGINAL LESION;  Surgeon: Theresia Majors, MD;  Location: [facility];  Service: Gynecology;  Laterality: N/A;  request to follow in [facility] iqueue held (DR. Penni Bombard has two other cases that morning starting 7:30am)   ESOPHAGOGASTRODUODENOSCOPY  01/2014   EXTRACORPOREAL SHOCK WAVE LITHOTRIPSY  2010   KNEE ARTHROPLASTY Right 03/24/2018   Procedure: RIGHT TOTAL KNEE ARTHROPLASTY WITH COMPUTER NAVIGATION;  Surgeon: Samson Frederic, MD;  Location: [facility];  Service: Orthopedics;  Laterality: Right;  Needs RNFA   KNEE ARTHROSCOPY Right 12/04/2016   Procedure: ARTHROSCOPY KNEE WITH PARTIAL MEDIAL MENISCECTOMY;  Surgeon: Samson Frederic, MD;  Location: [facility];  Service: Orthopedics;  Laterality: Right;   LEFT HEART CATH AND CORONARY ANGIOGRAPHY N/A 11/20/2021   Procedure: LEFT HEART CATH AND CORONARY ANGIOGRAPHY;  Surgeon: Kathleene Hazel, MD;  [facility]: [facility];  Service: Cardiovascular;  Laterality: N/A;   LEFT HEART CATHETERIZATION WITH CORONARY ANGIOGRAM N/A 01/11/2012   Procedure: LEFT HEART CATHETERIZATION WITH CORONARY ANGIOGRAM;  Surgeon: Tonny Bollman, MD;  Location: [facility];  Service: Cardiovascular;  Laterality: N/A;  widely patent coronary arterires without significant obstructive CAD,  normal LVF, ef 55-56%   LIVER TRANSPLANTATION  06/03/2022   TONSILLECTOMY AND ADENOIDECTOMY  child   TRANSTHORACIC ECHOCARDIOGRAM  01/18/2012   mild LVH,  ef 60%/  trivial TR   TUBAL LIGATION  1985    UPPER GASTROINTESTINAL ENDOSCOPY  last done 2019   Patient Active Problem List   Diagnosis Date Noted   Stroke [facility]) 10/24/2023   Diarrhea 09/06/2023   Thoracic compression fracture (HCC) 11/18/2022   Splenic laceration 11/11/2022   Liver transplant status (HCC) 08/12/2022   Coronary artery disease involving native coronary artery of native heart without angina pectoris    Adnexal mass 10/25/2020   Elevated cancer antigen 125 (CA 125) 10/25/2020   Thickened endometrium 10/25/2020   Recurrent UTI 06/12/2019   Secondary insomnia 04/10/2019   Anemia 02/28/2019   Degeneration of lumbar intervertebral disc 08/06/2018   History of paroxysmal supraventricular tachycardia    Liver cirrhosis secondary to NASH (HCC) 04/23/2018   Irritable bowel syndrome-diarrhea predominant 06/15/2017   Gastroesophageal reflux disease 06/15/2017   Hyperlipidemia    Kidney stone    Essential hypertension 02/13/2010    ONSET DATE: 10/23/23   REFERRING DIAG:  I63.9 (ICD-10-CM) - Cerebrovascular accident (CVA), unspecified mechanism (HCC)    THERAPY DIAG:  Dysarthria and anarthria  Dysphagia, oral phase  Rationale for Evaluation and Treatment: Rehabilitation  SUBJECTIVE:   SUBJECTIVE STATEMENT: "I forgot my gluten free crackers."  Pt accompanied by: self  PERTINENT HISTORY: Dawn Moon is a 67 y.o. female with medical history significant for liver transplant 05/2022 secondary to NASH at [facility], osteoporosis, CKD, IBD with chronic diarrhea, hypertension, hypomagnesemia who presents with greater than 24 hours of slurred speech.  The patient noticed slurred speech when she woke up 10/23/23. They came in to ED 10/24/23.  In the emergency department her workup included a CT which was unremarkable.  She then had an MRI which revealed an acute right corona radiata CVA.   PAIN:  Are you having pain? No  FALLS: Has patient fallen in last 6 months?  No   PATIENT GOALS: "Not have to  think about swallowing (my secretions)."  OBJECTIVE:  Note: Objective measures were completed at Evaluation unless otherwise noted.  DIAGNOSTIC FINDINGS:  MRI 10/24/23: IMPRESSION: 1. Acute to early subacute infarct in the right corona radiata in the MCA distribution without hemorrhage or mass effect. 2. Additional small remote infarcts and background chronic small-vessel ischemic change as above.    CLINICAL SWALLOW ASSESSMENT:  To be performed next session (11/16/23) due to pt forgetting her gluten-free crackers. She states pocketing is even less frequent now than it was at time of eval.   Pt completed a BSE in the hospital on 10/25/23, as follows: Continue regular diet and thin liquids as tolerated given pt is aware of pocketing and able to clear oral residue. She is able to select softer items from menu and/or cut up items as needed.  No acute SLP services identified for swallowing. SLP reviewed lingual strengthening exercises pt can complete independently in effort to aid oral efficiency and reduce pocketing.   PATIENT REPORTED OUTCOME MEASURES (PROM): EAT-10: pt scored 2/40 indicating very little difficulty with eating currently, and Speech - abbreviated  PROM and pt scored 9/30, indicating slight impact of speech intelligibility on daily tasks. ("Rarely" indicated for difficult to speak clearly, "a little bit" indicated for trouble speaking clearly, and being frustrated by speech difficulties.   TODAY'S TREATMENT:                                                                                                                                         DATE:  11/09/23: BSE next session, PRN. Pt indicated at the baby shower Sunday with friends and family "nobody noticed" her speech differences she still feels a little of, occasionally. She notices differences in her speech mostly now at nights. Today SLP worked with pt on compensations for speech intelligibility. SLP printed a handout of  compensations and directed pt to perform open mouth and slowed speech compensations. She read sentences with excellent success for compensation use and intelligibility. SLP took pt outdoors and assessed her ability to use compensations outside of ST room. Pt with very good success with this task. She agreed next week either decr frequency to once every other week, or once every four weeks, or d/c and return PRN.  11/02/23: SLP reviewed pt's HEP with her in today's session. SLP also discussed compensations for her speech with pt/husband and encouraged her to cont with these successful strategies.  PATIENT EDUCATION: Education details: see "today's treatment" Person educated: Patient and Spouse Education method: Explanation, Demonstration, Verbal cues, and Handouts Education comprehension: verbalized understanding, returned demonstration, verbal cues required, and needs further education   GOALS: Goals reviewed with patient? Yes  SHORT TERM GOALS: Target date: 12/03/23  Pt will complete HEP with modified independence in 2 sessions Baseline: Goal status: INITIAL  2.  Pt will report an improvement in her speech clarity to score 7 on Short Form-Speech PROM Baseline:  Goal status: INITIAL  3.  Pt will score 0 or 1 on EAT-10 in ease of swallowing solids, to demo improvement with swallowing skills Baseline:  Goal status: INITIAL   LONG TERM GOALS: Target date: 01/07/24  Pt will improve Speech PROM compared to initial administration Baseline:  Goal status: INITIAL  2.  Pt will complete HEP with modified independence in 2 sessions Baseline:  Goal status: INITIAL  3.  Pt will report an improvement in her speech clarity to score 6 on Short Form-Speech PROM Baseline:  Goal status: INITIAL   ASSESSMENT:  CLINICAL IMPRESSION: Patient is a 67 y.o. F who was seen today for treatment of speech and swallowing in light of lingering mild lingual weakness following a CVA earlier this month.  See "today's treatment" for more details. Pt remains 100% intelligible. With swallowing skills, pt states she is not experiencing significant difficulty currently (see "patient-related outcome measures" above).   OBJECTIVE IMPAIRMENTS: include dysarthria and dysphagia. These impairments are limiting patient from ADLs/IADLs, effectively communicating at home and in community, and safety when swallowing. Factors affecting potential  to achieve goals and functional outcome are  none noted today . Patient will benefit from skilled SLP services to address above impairments and improve overall function.  REHAB POTENTIAL: Excellent  PLAN:  SLP FREQUENCY: 1x/week  SLP DURATION: 6 weeks  PLANNED INTERVENTIONS: 92526 Treatment of swallowing function, 92507 Treatment of speech (30 or 45 min) , Environmental controls, Internal/external aids, Oral motor exercises, Functional tasks, Multimodal communication approach, SLP instruction and feedback, Compensatory strategies, and Patient/family education    [facility], CCC-SLP 11/09/2023, 10:25 AM

## 2023-11-09 NOTE — Patient Instructions (Addendum)
   Compensations for Dysarthria  Slowing down your speech. Extend the length of the vowel sounds (a,e,i,o,u). Using more breath to speak louder. Moving your lips and tongue more.  "OPEN YOUR MOUTH MORE" Saying sounds as clearly as you can.

## 2023-11-16 ENCOUNTER — Ambulatory Visit: Payer: Medicare Other

## 2023-11-16 DIAGNOSIS — R1311 Dysphagia, oral phase: Secondary | ICD-10-CM | POA: Diagnosis not present

## 2023-11-16 DIAGNOSIS — R471 Dysarthria and anarthria: Secondary | ICD-10-CM

## 2023-11-16 NOTE — Therapy (Signed)
OUTPATIENT SPEECH LANGUAGE PATHOLOGY EVALUATION   Patient Name: Dawn Moon MRN: [id number] DOB:[birth year], 67 y.o., female Today's Date: 11/16/2023  PCP: Kathlen Brunswick, Elbert Ewings, NP REFERRING PROVIDER: Lorin Glass, MD  END OF SESSION:  End of Session - 11/16/23 1325     Visit Number 3    Number of Visits 7    Date for SLP Re-Evaluation 12/17/23    SLP Start Time 1318    SLP Stop Time  1400    SLP Time Calculation (min) 42 min    Activity Tolerance Patient tolerated treatment well             Past Medical History:  Diagnosis Date   Ascites    Dysrhythmia    hx of SVT   Encephalopathy, hepatic (HCC) 05/13/2018   GERD (gastroesophageal reflux disease)    History of exercise stress test    03-07-2010  Stress echo--- no arrhythmias or conduction abnormalilites and negative for ischemia or chest pain   History of kidney stones    History of paroxysmal supraventricular tachycardia    episode 2011  consult w/ dr Graciela Husbands --  put on atenolol--  per pt no longer an issue   HTN (hypertension)    cardiologist --  dr Jens Som   IBS (irritable bowel syndrome)    diarrhea   Nonalcoholic steatohepatitis (NASH)    Numbness and tingling of left lower extremity    post achilles tendon repair   OA (osteoarthritis)    knees   Osteoporosis    Pleural effusion on right    hepatic hydrothorax   PMB (postmenopausal bleeding) none recent   thickened endometrium   PONV (postoperative nausea and vomiting)    Recurrent UTI none since 2019   Secondary esophageal varices without bleeding (HCC) 06/03/2018   Vitamin D deficiency    Wears glasses    Past Surgical History:  Procedure Laterality Date   ACHILLES TENDON SURGERY Left 06/2016   BIOPSY  11/25/2021   Procedure: BIOPSY;  Surgeon: Iva Boop, MD;  [facility];  Service: Endoscopy;;   BREAST BIOPSY Left 02/2016   benign   CARDIAC CATHETERIZATION     COLONOSCOPY  2005   COLONOSCOPY WITH PROPOFOL N/A  11/25/2021   Procedure: COLONOSCOPY WITH PROPOFOL;  Surgeon: Iva Boop, MD;  [facility];  Service: Endoscopy;  Laterality: N/A;   CT CTA CORONARY W/CA SCORE W/CM &/OR WO/CM  01/01/2014   non-obstructive calcified plaque in pLAD (0-25%), no significant incidental noncardiac findings noted   DILATATION & CURETTAGE/HYSTEROSCOPY WITH MYOSURE N/A 12/02/2020   Procedure: DILATATION & CURETTAGE/HYSTEROSCOPY WITH POLYPECTOMY AND REMOVAL OF VAGINAL LESION;  Surgeon: Theresia Majors, MD;  Location: [facility];  Service: Gynecology;  Laterality: N/A;  request to follow in [facility] iqueue held (DR. Penni Bombard has two other cases that morning starting 7:30am)   ESOPHAGOGASTRODUODENOSCOPY  01/2014   EXTRACORPOREAL SHOCK WAVE LITHOTRIPSY  2010   KNEE ARTHROPLASTY Right 03/24/2018   Procedure: RIGHT TOTAL KNEE ARTHROPLASTY WITH COMPUTER NAVIGATION;  Surgeon: Samson Frederic, MD;  [facility];  Service: Orthopedics;  Laterality: Right;  Needs RNFA   KNEE ARTHROSCOPY Right 12/04/2016   Procedure: ARTHROSCOPY KNEE WITH PARTIAL MEDIAL MENISCECTOMY;  Surgeon: Samson Frederic, MD;  Location: [facility];  Service: Orthopedics;  Laterality: Right;   LEFT HEART CATH AND CORONARY ANGIOGRAPHY N/A 11/20/2021   Procedure: LEFT HEART CATH AND CORONARY ANGIOGRAPHY;  Surgeon: Kathleene Hazel, MD;  [facility];  Service: Cardiovascular;  Laterality: N/A;   LEFT HEART CATHETERIZATION WITH CORONARY ANGIOGRAM N/A 01/11/2012   Procedure: LEFT HEART CATHETERIZATION WITH CORONARY ANGIOGRAM;  Surgeon: Tonny Bollman, MD;  Location: [facility];  Service: Cardiovascular;  Laterality: N/A;  widely patent coronary arterires without significant obstructive CAD,  normal LVF, ef 55-56%   LIVER TRANSPLANTATION  06/03/2022   TONSILLECTOMY AND ADENOIDECTOMY  child   TRANSTHORACIC ECHOCARDIOGRAM  01/18/2012   mild LVH,  ef 60%/  trivial TR   TUBAL LIGATION  1985    UPPER GASTROINTESTINAL ENDOSCOPY  last done 2019   Patient Active Problem List   Diagnosis Date Noted   Stroke [facility]) 10/24/2023   Diarrhea 09/06/2023   Thoracic compression fracture (HCC) 11/18/2022   Splenic laceration 11/11/2022   Liver transplant status (HCC) 08/12/2022   Coronary artery disease involving native coronary artery of native heart without angina pectoris    Adnexal mass 10/25/2020   Elevated cancer antigen 125 (CA 125) 10/25/2020   Thickened endometrium 10/25/2020   Recurrent UTI 06/12/2019   Secondary insomnia 04/10/2019   Anemia 02/28/2019   Degeneration of lumbar intervertebral disc 08/06/2018   History of paroxysmal supraventricular tachycardia    Liver cirrhosis secondary to NASH (HCC) 04/23/2018   Irritable bowel syndrome-diarrhea predominant 06/15/2017   Gastroesophageal reflux disease 06/15/2017   Hyperlipidemia    Kidney stone    Essential hypertension 02/13/2010    ONSET DATE: 10/23/23   REFERRING DIAG:  I63.9 (ICD-10-CM) - Cerebrovascular accident (CVA), unspecified mechanism (HCC)    THERAPY DIAG:  Dysarthria and anarthria  Rationale for Evaluation and Treatment: Rehabilitation  SUBJECTIVE:   SUBJECTIVE STATEMENT: "I forgot my gluten free crackers."  Pt accompanied by: self  PERTINENT HISTORY: Dawn Moon is a 67 y.o. female with medical history significant for liver transplant 05/2022 secondary to NASH at [facility], osteoporosis, CKD, IBD with chronic diarrhea, hypertension, hypomagnesemia who presents with greater than 24 hours of slurred speech.  The patient noticed slurred speech when she woke up 10/23/23. They came in to ED 10/24/23.  In the emergency department her workup included a CT which was unremarkable.  She then had an MRI which revealed an acute right corona radiata CVA.   PAIN:  Are you having pain? No  FALLS: Has patient fallen in last 6 months?  No   PATIENT GOALS: "Not have to think about swallowing (my  secretions)."  OBJECTIVE:  Note: Objective measures were completed at Evaluation unless otherwise noted.  DIAGNOSTIC FINDINGS:  MRI 10/24/23: IMPRESSION: 1. Acute to early subacute infarct in the right corona radiata in the MCA distribution without hemorrhage or mass effect. 2. Additional small remote infarcts and background chronic small-vessel ischemic change as above.    CLINICAL SWALLOW ASSESSMENT:  To be performed next session (11/16/23) due to pt forgetting her gluten-free crackers. She states pocketing is even less frequent now than it was at time of eval.   Pt completed a BSE in the hospital on 10/25/23, as follows: Continue regular diet and thin liquids as tolerated given pt is aware of pocketing and able to clear oral residue. She is able to select softer items from menu and/or cut up items as needed.  No acute SLP services identified for swallowing. SLP reviewed lingual strengthening exercises pt can complete independently in effort to aid oral efficiency and reduce pocketing.   PATIENT REPORTED OUTCOME MEASURES (PROM): EAT-10: pt scored 2/40 indicating very little difficulty with eating currently, and Speech - abbreviated PROM and pt scored  9/30, indicating slight impact of speech intelligibility on daily tasks. ("Rarely" indicated for difficult to speak clearly, "a little bit" indicated for trouble speaking clearly, and being frustrated by speech difficulties.   TODAY'S TREATMENT:                                                                                                                                         DATE:  11/16/23: Pt arrives with gluten free crackers for BSE. Pt appears safe with regular diet and thin liquids at this time, based upon this assessment. Pt stated she felt she was approx baseline with swallowing. Details of BSE below: SWALLOW: ORAL MOTOR EXAMINATION: Overall status: Impaired: Lingual: Left (Strength) Comments: MINIMAL weakness (80% improved over  initial eval)  CLINICAL SWALLOW ASSESSMENT (BSE):   Dentition: adequate natural dentition Vocal quality at baseline: normal Patient directly observed with POs: Yes: regular and thin liquids  Feeding: able to feed self Liquids provided by: bottle Oral phase signs and symptoms: nothing noted nor reported Pharyngeal phase signs and symptoms: nothing noted nor reported SPEECH: Pt states she was ordering food this morning and had difficulty with verbalizing "sausage" clearly. SLP and pt targeted compensations of incr'd articulatory movement and rate reduction in multiple sentence responses in 30-90 second intervals. Pt reported she had overarticulation in mind approx 80% of the time.  Pt suggested she decr frequency to once/ month. SLP agreed. [place] told SLP she feels she is much improved with her speech skills but has more difficulty with   11/09/23: BSE next session, PRN. Pt indicated at the baby shower Sunday with friends and family "nobody noticed" her speech differences she still feels a little of, occasionally. She notices differences in her speech mostly now at nights. Today SLP worked with pt on compensations for speech intelligibility. SLP printed a handout of compensations and directed pt to perform open mouth and slowed speech compensations. She read sentences with excellent success for compensation use and intelligibility. SLP took pt outdoors and assessed her ability to use compensations outside of ST room. Pt with very good success with this task. She agreed next week either decr frequency to once every other week, or once every four weeks, or d/c and return PRN.  11/02/23: SLP reviewed pt's HEP with her in today's session. SLP also discussed compensations for her speech with pt/husband and encouraged her to cont with these successful strategies.  PATIENT EDUCATION: Education details: see "today's treatment" Person educated: Patient and Spouse Education method: Explanation,  Demonstration, Verbal cues, and Handouts Education comprehension: verbalized understanding, returned demonstration, verbal cues required, and needs further education   GOALS: Goals reviewed with patient? Yes  SHORT TERM GOALS: Target date: 12/03/23  Pt will complete HEP with modified independence in 2 sessions Baseline: Goal status: INITIAL  2.  Pt will report an improvement in her speech clarity to score 7 on Short Form-Speech PROM Baseline:  Goal status: INITIAL  3.  Pt will score 0 or 1 on EAT-10 in ease of swallowing solids, to demo improvement with swallowing skills Baseline:  Goal status: deferred - swallowing at 90% of baseline   LONG TERM GOALS: Target date: 01/07/24  Pt will improve Speech PROM compared to initial administration Baseline:  Goal status: INITIAL  2.  Pt will complete HEP with modified independence in 2 sessions Baseline:  Goal status: INITIAL  3.  Pt will report an improvement in her speech clarity to score 6 on Short Form-Speech PROM Baseline:  Goal status: INITIAL   ASSESSMENT:  CLINICAL IMPRESSION: Patient is a 66 y.o. F who was seen today for treatment of speech and swallowing in light of lingering mild lingual weakness following a CVA earlier this month. See "today's treatment" for more details. Pt remains 100% intelligible. With swallowing skills, pt states she is not experiencing significant difficulty currently (see "patient-related outcome measures" above). A bSE was completed today and confirmed pt's oral and pharyngeal swallow are both WNL. Pt stated she felt she was approx baseline with swallowing, and near-baseline with her speech skills. SLPagreed pt could reduce to once/month.  OBJECTIVE IMPAIRMENTS: include dysarthria and dysphagia. These impairments are limiting patient from ADLs/IADLs, effectively communicating at home and in community, and safety when swallowing. Factors affecting potential to achieve goals and functional outcome  are  none noted today . Patient will benefit from skilled SLP services to address above impairments and improve overall function.  REHAB POTENTIAL: Excellent  PLAN:  SLP FREQUENCY: 1x/month  SLP DURATION: 6 weeks  PLANNED INTERVENTIONS: 92526 Treatment of swallowing function, 92507 Treatment of speech (30 or 45 min) , Environmental controls, Internal/external aids, Oral motor exercises, Functional tasks, Multimodal communication approach, SLP instruction and feedback, Compensatory strategies, and Patient/family education    [facility], CCC-SLP 11/16/2023, 1:26 PM    [email or web address]

## 2023-12-05 NOTE — Progress Notes (Unsigned)
Cardiology Office Note    Date:  12/06/2023  ID:  Dawn Moon 26-Jan-1956, MRN [id number] PCP:  April Manson, NP  Cardiologist:  Olga Millers, MD  Electrophysiologist:  None   Chief Complaint: Hospital follow up   History of Present Illness: .    Dawn Moon is a 67 y.o. female with visit-pertinent history of nonobstructive CAD on cardiac cath in 2013 and an cardiac CT in 2015, palpitations with brief atrial tachycardia and nonsustained VT noted on CardioNet in 2011, hypertension, obstructive sleep apnea, IBS, cirrhosis secondary to NASH, esophageal varices.   Dawn Moon has a history of palpitations, CardioNet in 2011 showed 4 beat of nonsustained VT brief episodes of atrial tachycardia as well as sinus tachycardia.  She was seen by Dr. Graciela Husbands and started on verapamil.  Her symptoms did not improve and she was changed to beta-blocker, symptoms improved with atenolol.  She underwent cardiac catheterization in 2013 which showed nonobstructive CAD.  EF was 55 to 65%.  She then had a cardiac CT in 12/2013 which showed nonobstructive plaque in the proximal LAD.  She was admitted in 03/2018 and underwent right knee replacement.  Following discharge she developed worsening dyspnea.  Echo in 04/2018 showed LVEF of 60 to 65% with normal wall motion and mildly dilated left atrium.  Chest CTA was negative for PE but did show a large right pleural effusion and lobulated liver compatible with cirrhosis.  She also had ascites and therefore underwent right thoracentesis and paracentesis.  She was diagnosed with cirrhosis secondary to NASH.  She was diuresed and discharged on high dose of diuretics.  She was then readmitted with confusion and diagnosed with hepatic encephalopathy and acute renal insufficiency.  She was treated with lactulose and Xifaxan, diuretics were reduced.  She is referred to [facility] for consideration of transplant.  EGD in March 2021 showed esophageal varices.  CTA in  September 21 showed 5 mm left upper lobe pulmonary nodule, cirrhotic liver with portal venous hypertension including perigastric and paraesophageal varices and cystic lesion in the right adnexal space.  Echocardiogram at [facility] in October 2022 showed normal LV function and late positive microcavitation study consistent with extracardiac shunting.  Stress echo in October 2022 suggested wall motion abnormality in the inferior wall.  Cardiac CTA at [facility] with FFR suggesting significant ischemia in the distal LAD.  In 11/2021 she underwent left heart catheterization that showed a focal 50 to 60% stenosis in the small caliber distal LAD, no obstructive disease and a moderate caliber diagonal branch.  Circumflex artery was normal in caliber and terminates into an obtuse marginal branches, no obstructive disease noted, RCA with no obstructive disease, she had normal LV filling pressures.  Medical management of moderate stenosis in the small caliber distal LAD was recommended.  In June 2023 she underwent liver transplant at [facility], transesophageal echocardiogram at that time showed normal LV function and mild to moderate tricuspid regurgitation.  She was last seen by Dr. Jens Som on 10/19/2022.  She reported that she had overall been doing significantly well following her liver transplant.  She denied dyspnea chest pain, palpitations or syncope.  On 10/24/2023 she presented to the emergency department with slurred speech that been present for more than 24 hours.  Patient reported episodes of palpitations in the preceding few days.  CT head was unremarkable however MRI brain showed acute to early subacute infarct in the right corona radiata in the MCA distribution without hemorrhage or mass effect.  There were additional small remote infarcts and background chronic small vessel ischemic changes.  MRI brain unremarkable.  Per neurology it was felt to likely be a lacunar stroke secondary to small vessel.  Neurology  recommended 3 weeks of aspirin and Plavix followed by Plavix alone.  Carotid duplex indicated bilateral 1 to 39% stenosis.  Echocardiogram indicated LVEF of 60 to 65%, no RWMA, grade 1 diastolic dysfunction trivial mitral valve regurgitation, aortic valve sclerosis was evident without any stenosis.  She was mailed 30-day cardiac monitor, unfortunately these results are not available for review today.  Today she presents for follow up. She reports that she is doing well, her slurred speech and left-sided weakness has resolved.  She does note some slight ongoing brain fog.  She denies chest pain, shortness of breath or palpitations.  She reports that she has returned her cardiac monitor.  She notes that her fluid retention since her liver transplant has overall resolved.  She notes that she has some slight right leg swelling and discomfort on palpation related to her knee replacment and a MCV, this is overall unchanged.  ROS: .   Today she denies chest pain, shortness of breath, fatigue, palpitations, melena, hematuria, hemoptysis, diaphoresis, weakness, presyncope, syncope, orthopnea, and PND.  All other systems are reviewed and otherwise negative. Studies Reviewed: Marland Kitchen    EKG:  EKG is ordered today, personally reviewed, demonstrating  EKG Interpretation Date/Time:  Monday December 06 2023 11:00:50 EST Ventricular Rate:  61 PR Interval:  130 QRS Duration:  72 QT Interval:  388 QTC Calculation: 390 R Axis:   26  Text Interpretation: Normal sinus rhythm Low voltage QRS No significant change since last tracing Confirmed by Reather Littler [phone]) on 12/06/2023 11:41:02 AM    CV Studies:  Cardiac Studies & Procedures   CARDIAC CATHETERIZATION  CARDIAC CATHETERIZATION 11/20/2021  Narrative   Dist LAD lesion is 60% stenosed.  The LAD is a normal caliber vessel that courses to the apex. There is a focal 50-60% stenosis in the small caliber distal LAD. No obstructive disease in a moderate caliber  diagonal branch. The Circumflex artery is normal in caliber and terminates into an obtuse marginal branch. No obstructive disease noted. The RCA is a large dominant vessel with no obstructive disease. Normal LV filling pressures  Recommendations: Medical management of moderate stenosis in the small caliber distal LAD. Proceed with planning for liver transplantation.  Findings Coronary Findings Diagnostic  Dominance: Right  Left Anterior Descending Vessel is large. Dist LAD lesion is 60% stenosed.  Left Circumflex Vessel is large.  Intervention  No interventions have been documented.    ECHOCARDIOGRAM  ECHOCARDIOGRAM COMPLETE 10/25/2023  Narrative ECHOCARDIOGRAM REPORT    Patient Name:   Dawn Moon Date of Exam: 10/25/2023 Medical Rec #:  [id number]              Height:       63.0 in Accession #:    [id number]             Weight:       169.0 lb Date of Birth:  May 29, 1956              BSA:          1.800 m Patient Age:    67 years               BP:           126/84 mmHg Patient Gender: F  HR:           77 bpm. Exam Location:  Inpatient  Procedure: 2D Echo, Cardiac Doppler and Color Doppler  Indications:    TIA  History:        Patient has prior history of Echocardiogram examinations, most recent 09/05/2020. TIA; Risk Factors:Hypertension.  Sonographer:    Darlys Gales Referring Phys: [id number] SRISHTI L BHAGAT  IMPRESSIONS   1. Left ventricular ejection fraction, by estimation, is 60 to 65%. The left ventricle has normal function. The left ventricle has no regional wall motion abnormalities. Left ventricular diastolic parameters are consistent with Grade I diastolic dysfunction (impaired relaxation). 2. Right ventricular systolic function is normal. The right ventricular size is normal. 3. The mitral valve is normal in structure. Trivial mitral valve regurgitation. No evidence of mitral stenosis. 4. The aortic valve is normal in  structure. There is mild calcification of the aortic valve. Aortic valve regurgitation is not visualized. Aortic valve sclerosis/calcification is present, without any evidence of aortic stenosis.  FINDINGS Left Ventricle: Left ventricular ejection fraction, by estimation, is 60 to 65%. The left ventricle has normal function. The left ventricle has no regional wall motion abnormalities. The left ventricular internal cavity size was normal in size. There is no left ventricular hypertrophy. Left ventricular diastolic parameters are consistent with Grade I diastolic dysfunction (impaired relaxation).  Right Ventricle: The right ventricular size is normal. No increase in right ventricular wall thickness. Right ventricular systolic function is normal.  Left Atrium: Left atrial size was normal in size.  Right Atrium: Right atrial size was normal in size.  Pericardium: There is no evidence of pericardial effusion.  Mitral Valve: The mitral valve is normal in structure. Trivial mitral valve regurgitation. No evidence of mitral valve stenosis.  Tricuspid Valve: The tricuspid valve is normal in structure. Tricuspid valve regurgitation is not demonstrated. No evidence of tricuspid stenosis.  Aortic Valve: The aortic valve is normal in structure. There is mild calcification of the aortic valve. Aortic valve regurgitation is not visualized. Aortic valve sclerosis/calcification is present, without any evidence of aortic stenosis. Aortic valve mean gradient measures 3.0 mmHg. Aortic valve peak gradient measures 5.5 mmHg. Aortic valve area, by VTI measures 3.10 cm.  Pulmonic Valve: The pulmonic valve was normal in structure. Pulmonic valve regurgitation is not visualized. No evidence of pulmonic stenosis.  Aorta: The aortic root is normal in size and structure.  Venous: The inferior vena cava was not well visualized.  IAS/Shunts: No atrial level shunt detected by color flow Doppler.   LEFT  VENTRICLE PLAX 2D LVOT diam:     2.00 cm   Diastology LV SV:         70        LV e' medial:    4.79 cm/s LV SV Index:   39        LV E/e' medial:  15.3 LVOT Area:     3.14 cm  LV e' lateral:   7.94 cm/s LV E/e' lateral: 9.2   RIGHT VENTRICLE RV S prime:     11.70 cm/s TAPSE (M-mode): 1.8 cm  LEFT ATRIUM             Index        RIGHT ATRIUM          Index LA Vol (A2C):   22.6 ml 12.56 ml/m  RA Area:     9.30 cm LA Vol (A4C):   39.4 ml 21.89 ml/m  RA Volume:  18.80 ml 10.44 ml/m LA Biplane Vol: 30.8 ml 17.11 ml/m AORTIC VALVE AV Area (Vmax):    2.95 cm AV Area (Vmean):   2.76 cm AV Area (VTI):     3.10 cm AV Vmax:           117.00 cm/s AV Vmean:          77.400 cm/s AV VTI:            0.227 m AV Peak Grad:      5.5 mmHg AV Mean Grad:      3.0 mmHg LVOT Vmax:         110.00 cm/s LVOT Vmean:        67.900 cm/s LVOT VTI:          0.224 m LVOT/AV VTI ratio: 0.99  AORTA Ao Root diam: 2.70 cm  MITRAL VALVE MV Area (PHT): 3.99 cm    SHUNTS MV Decel Time: 190 msec    Systemic VTI:  0.22 m MV E velocity: 73.20 cm/s  Systemic Diam: 2.00 cm MV A velocity: 75.90 cm/s MV E/A ratio:  0.96  Arvilla Meres MD Electronically signed by Arvilla Meres MD Signature Date/Time: 10/25/2023/10:47:07 AM    Final    CT SCANS  CT CORONARY MORPH W/CTA COR W/SCORE 01/01/2014  Addendum 01/02/2014  4:01 PM ADDENDUM REPORT: 01/02/2014 15:58  ADDENDUM: OVER-READ INTERPRETATION  CT CHEST  The following report is an over-read performed by radiologist Dr. [facility] on 01/02/2014. This over-read does not include interpretation of cardiac or coronary anatomy or pathology. The CTA interpretation by the cardiologist is attached.  FINDINGS: Within the visualized portions of the thorax there is no acute consolidative airspace disease, no pneumothorax, no suspicious appearing pulmonary nodules or masses, and no pleural effusion. Visualized portions of the  upper abdomen are unremarkable. There are no aggressive appearing lytic or blastic lesions noted in the visualized portions of the skeleton.  IMPRESSION: 1. No significant incidental noncardiac findings noted.   Electronically Signed By: Trudie Reed M.D. On: 01/02/2014 15:58  Narrative CLINICAL DATA:  Chest pain  EXAM: Cardiac/ Coronary CT  TECHNIQUE: The patient was scanned on a Philips 256 scanner. A 120 kV prospective scan was triggered in the Systolic and descending thoracic aorta at 111 HU's. Gantry rotation speed was 270 msecs and collimation was .9 mm. 5 mg of iv Metoprolol and 0.4 mg of sl NTG were given. The 3D data set was reconstructed in 3% intervals of the 72-78 % of R-R cycle. Diastolic phases were analyzed on a dedicated work station using MPR, MIP and VRT modes. The patient received 80 cc of contrast.  FINDINGS: Coronary Arteries: Originating in a normal position. There is right dominance.  Left main:  No plague.  Gives rise to LAD and LCX.  LAD is a large caliber vessel. LAD gives rise to a large diagonal branch. There is minimal calcified plague in the proximal LAD associated with 0-25% stenosis. The mid and distal LAD only have luminal irregularities.  The first diagonal branch has no significant plague.  LCX is a non-dominant vessel that gives rise to a medium size obtuse marginal branch. There is no obvious plague in the LCX territory.  RCA is a large dominant vessel. There is motion in the mid RCA, however only luminal irregularities are noted. RCA gives rise to both PDA and PLVB, both free of significant plague.  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 Medical management is recommended.  Tobias Alexander  Electronically Signed: By: Tobias Alexander On: 01/01/2014 16:40            Current Reported Medications:.    Current Meds  Medication Sig   Cholecalciferol (VITAMIN D3 PO) Take 1 capsule by mouth  daily.   clopidogrel (PLAVIX) 75 MG tablet Take 1 tablet (75 mg total) by mouth daily.   denosumab (PROLIA) 60 MG/ML SOSY injection    diphenoxylate-atropine (LOMOTIL) 2.5-0.025 MG tablet Take 1 tablet by mouth every 6 (six) hours as needed for diarrhea or loose stools.   ezetimibe (ZETIA) 10 MG tablet Take 1 tablet (10 mg total) by mouth daily.   GNP MELATONIN 3 MG TABS tablet Take 6 mg by mouth at bedtime. 2 at bedtime   metoprolol tartrate (LOPRESSOR) 25 MG tablet Take 1 tablet (25 mg total) by mouth 2 (two) times daily.   mycophenolate (CELLCEPT) 250 MG capsule Take 250 mg by mouth 2 (two) times daily.   Specialty Vitamins Products (MG PLUS PROTEIN) 133 MG TABS Take 3 tablets by mouth in the morning and at bedtime.    Physical Exam:    VS:  BP 124/80   Pulse 61   Ht 5\' 3"  (1.6 m)   Wt 173 lb 6.4 oz (78.7 kg)   SpO2 99%   BMI 30.72 kg/m    Wt Readings from Last 3 Encounters:  12/06/23 173 lb 6.4 oz (78.7 kg)  10/25/23 171 lb 6.4 oz (77.7 kg)  09/06/23 169 lb (76.7 kg)    GEN: Well nourished, well developed in no acute distress NECK: No JVD; No carotid bruits CARDIAC: RRR, no murmurs, rubs, gallops RESPIRATORY:  Clear to auscultation without rales, wheezing or rhonchi  ABDOMEN: Soft, non-tender, non-distended EXTREMITIES:  No edema; No acute deformity   Asessement and Plan:.    CAD: In 11/2021 she underwent left heart catheterization that showed a focal 50 to 60% stenosis in the small caliber distal LAD, no obstructive disease and a moderate caliber diagonal branch.  Circumflex artery was normal in caliber and terminates into an obtuse marginal branches, no obstructive disease noted, RCA with no obstructive disease, she had normal LV filling pressures.  Medical management of moderate stenosis in the small caliber distal LAD was recommended. Stable with no anginal symptoms. No indication for ischemic evaluation.  Heart healthy diet and regular cardiovascular exercise encouraged.   Reviewed ED precautions. Continue Plavix 75 mg daily, Zetia 10 mg daily, Lopressor 25 mg twice daily.  CVA: Admitted 10/24/2023 for over 24 hours of slurred speech and slight left-sided facial droop. CT head was unremarkable however MRI brain showed acute to early subacute infarct in the right corona radiata in the MCA distribution without hemorrhage or mass effect.  There were additional small remote infarcts and background chronic small vessel ischemic changes.  MRA brain unremarkable.  Per neurology it was felt to likely be a lacunar stroke secondary to small vessel.  Patient has worn 30-day cardiac monitor, results unfortunate not available for review today.  Patient notes symptoms have overall resolved, notes some intermittent brain fog. Patient denies any palpitations prior to CVA, she notes she has not had palpitations in recent years.  She is now on Plavix 75 mg daily per neurology.  History of volume excess: Due to cirrhosis, underwent liver transplant at [facility] in 05/2022.  Today she appears euvolemic and well compensated on exam.  History of palpitations/nonsustained ventricular tachycardia: Reports she has not had any palpitations in recent years.  Continue Lopressor 25 mg twice  daily.  Hypertension: Blood pressure today 124/80. Continue current antihypertensive regimen.   Carotid artery stenosis: Carotid duplex in 11//24 indicated bilateral 1 to 39% stenosis.  On Plavix per neurology.  Hyperlipidemia: Last LDL 98.  On Zetia 10 mg daily.  Patient intolerant to statin therapy transplant providers also counseled patient to avoid statins, she plans to discuss starting on Leqvio on follow-up with Dr. Pearlean Brownie pending further discussion with [facility] transplant provider. She is working with insurance to determine coverage.     Disposition: F/u with Dr. Jens Som in 3-4 months or sooner if needed.   Signed, Rip Harbour, NP

## 2023-12-06 ENCOUNTER — Ambulatory Visit: Payer: Medicare Other | Attending: Cardiology | Admitting: Cardiology

## 2023-12-06 ENCOUNTER — Encounter: Payer: Self-pay | Admitting: Cardiology

## 2023-12-06 VITALS — BP 124/80 | HR 61 | Ht 63.0 in | Wt 173.4 lb

## 2023-12-06 DIAGNOSIS — E782 Mixed hyperlipidemia: Secondary | ICD-10-CM | POA: Diagnosis present

## 2023-12-06 DIAGNOSIS — E8770 Fluid overload, unspecified: Secondary | ICD-10-CM | POA: Diagnosis present

## 2023-12-06 DIAGNOSIS — R002 Palpitations: Secondary | ICD-10-CM | POA: Insufficient documentation

## 2023-12-06 DIAGNOSIS — I639 Cerebral infarction, unspecified: Secondary | ICD-10-CM | POA: Diagnosis not present

## 2023-12-06 DIAGNOSIS — I10 Essential (primary) hypertension: Secondary | ICD-10-CM | POA: Insufficient documentation

## 2023-12-06 DIAGNOSIS — I2510 Atherosclerotic heart disease of native coronary artery without angina pectoris: Secondary | ICD-10-CM | POA: Diagnosis not present

## 2023-12-06 NOTE — Patient Instructions (Signed)
Medication Instructions:  No changes *If you need a refill on your cardiac medications before your next appointment, please call your pharmacy*  Lab Work: No labs  Testing/Procedures: No testing  Follow-Up: At [facility], you and your health needs are our priority.  As part of our continuing mission to provide you with exceptional heart care, we have created designated Provider Care Teams.  These Care Teams include your primary Cardiologist (physician) and Advanced Practice Providers (APPs -  Physician Assistants and Nurse Practitioners) who all work together to provide you with the care you need, when you need it.  We recommend signing up for the patient portal called "MyChart".  Sign up information is provided on this After Visit Summary.  MyChart is used to connect with patients for Virtual Visits (Telemedicine).  Patients are able to view lab/test results, encounter notes, upcoming appointments, etc.  Non-urgent messages can be sent to your provider as well.   To learn more about what you can do with MyChart, go to [email or web address].    Your next appointment:   3-4 month(s)  Provider:   Olga Millers, MD

## 2023-12-07 ENCOUNTER — Ambulatory Visit: Payer: Medicare Other | Attending: Cardiology

## 2023-12-07 ENCOUNTER — Telehealth: Payer: Self-pay

## 2023-12-07 DIAGNOSIS — I639 Cerebral infarction, unspecified: Secondary | ICD-10-CM

## 2023-12-07 NOTE — Telephone Encounter (Signed)
Called patient advised of below they verbalized understanding.

## 2023-12-07 NOTE — Telephone Encounter (Signed)
-----   Message from Jonita Albee sent at 12/07/2023  1:13 PM EST ----- Please tell patient that her cardiac monitor showed sinus bradycardia, normal sinus rhythm, and sinus tachycardia. No atrial fibrillation detected. No heart rhythms detected that would have caused her stroke   Thanks KJ

## 2023-12-13 ENCOUNTER — Ambulatory Visit: Payer: Medicare Other

## 2024-01-07 ENCOUNTER — Other Ambulatory Visit: Payer: Self-pay

## 2024-01-07 MED ORDER — RIFAXIMIN 550 MG PO TABS
550.0000 mg | ORAL_TABLET | Freq: Three times a day (TID) | ORAL | 0 refills | Status: AC
Start: 2024-01-07 — End: 2024-01-21

## 2024-01-07 NOTE — Telephone Encounter (Signed)
Prescription faxed to [place] pharmacy.

## 2024-01-24 ENCOUNTER — Inpatient Hospital Stay: Payer: PRIVATE HEALTH INSURANCE | Admitting: Neurology

## 2024-01-26 NOTE — Telephone Encounter (Signed)
 PJ do you still have the form that was faxed to the [place] pharmacy? I can refax it- the pt states that the pharmacy has not received.

## 2024-01-28 ENCOUNTER — Encounter: Payer: Self-pay | Admitting: Gastroenterology

## 2024-02-10 ENCOUNTER — Ambulatory Visit (INDEPENDENT_AMBULATORY_CARE_PROVIDER_SITE_OTHER): Payer: Medicare Other | Admitting: Neurology

## 2024-02-10 ENCOUNTER — Encounter: Payer: Self-pay | Admitting: Neurology

## 2024-02-10 ENCOUNTER — Inpatient Hospital Stay: Payer: Medicare Other | Admitting: Neurology

## 2024-02-10 VITALS — BP 150/79 | HR 71 | Ht 63.0 in | Wt 177.0 lb

## 2024-02-10 DIAGNOSIS — I6381 Other cerebral infarction due to occlusion or stenosis of small artery: Secondary | ICD-10-CM | POA: Diagnosis not present

## 2024-02-10 DIAGNOSIS — I679 Cerebrovascular disease, unspecified: Secondary | ICD-10-CM

## 2024-02-10 NOTE — Progress Notes (Signed)
[facility] [address] [place].  [id number] [phone]       OFFICE FOLLOW-UP NOTE  Ms. Dawn Moon Date of Birth:  11-18-1956 Medical Record Number:  [id number]   HPI: Dawn Moon is a pleasant 67 year old Caucasian lady seen today for initial office follow-up visit following hospital consultation for stroke in November 2024.  History is obtained from the patient and personally reviewed electronic medical records.  I personally reviewed pertinent available imaging films in PACS.he has past medical history of  NASH cirrhosis status post liver transplant in June 2023 on long-term immunosuppressants, hypertension, hyperlipidemia, history of statin intolerance, PSVT, gastroesophageal reflux disease, irritable bowel syndrome who presented on 10/24/2023 with sudden onset of slurred speech and left facial droop.  She presented outside time window for thrombolysis. NIH was 2 on admission for facial droop and dysarthria.  MRI scan showed acute to subacute infarct in the right corona radiator measuring 13 mm as well as small remote age infarcts in the deep white matter and chronic changes of small vessel disease.  MR angiogram of the brain showed no large vessel intracranial stenosis.  Carotid ultrasound showed no significant extracranial stenosis.  Transthoracic echo showed ejection fraction of 60-65%.  LDL cholesterol 98 mg percent.  Hemoglobin A1c was 5.3.  Telemetry monitoring during hospitalization did not reveal any atrial fibrillation.  Patient was discharged on dual antiplatelet therapy aspirin and Plavix for 3 weeks and then Plavix alone.  Patient states she has done well.  Her facial droop and speech have improved completely back to baseline.  She has no residual deficits.  She denies any new recurrent stroke or TIA symptoms.  She is tolerating Plavix well with minor bruising but no bleeding episodes.  She is on Zetia for elevated lipids and tolerating it well but  has not had any follow-up lipid profile checked.  She did have outpatient 30-day heart monitor which was negative for paroxysmal A-fib.  She is bothered by low back pain members who plans to do elective ablation.  Complaints today.  She denies any known prior history of stroke. ROS:   14 system review of systems is positive for bruising, weakness all other systems negative  PMH:  Past Medical History:  Diagnosis Date   Ascites    Dysrhythmia    hx of SVT   Encephalopathy, hepatic (HCC) 05/13/2018   GERD (gastroesophageal reflux disease)    History of exercise stress test    03-07-2010  Stress echo--- no arrhythmias or conduction abnormalilites and negative for ischemia or chest pain   History of kidney stones    History of paroxysmal supraventricular tachycardia    episode 2011  consult w/ dr Graciela Husbands --  put on atenolol--  per pt no longer an issue   HTN (hypertension)    cardiologist --  dr Jens Som   IBS (irritable bowel syndrome)    diarrhea   Nonalcoholic steatohepatitis (NASH)    Numbness and tingling of left lower extremity    post achilles tendon repair   OA (osteoarthritis)    knees   Osteoporosis    Pleural effusion on right    hepatic hydrothorax   PMB (postmenopausal bleeding) none recent   thickened endometrium   PONV (postoperative nausea and vomiting)    Recurrent UTI none since 2019   Secondary esophageal varices without bleeding (HCC) 06/03/2018   Vitamin D deficiency    Wears glasses     Social History:  Social History   Socioeconomic History  Marital status: Married    Spouse name: Not on file   Number of children: 3   Years of education: 16   Highest education level: Bachelor's degree (e.g., BA, AB, BS)  Occupational History   Occupation: inpatient case Event organiser: Advertising copywriter  Tobacco Use   Smoking status: Never   Smokeless tobacco: Never  Vaping Use   Vaping status: Never Used  Substance and Sexual Activity   Alcohol use: Not  Currently   Drug use: No   Sexual activity: Yes    Birth control/protection: Post-menopausal    Comment: 1st intercourse 68 yo-Fewer than 5 partners  Other Topics Concern   Not on file  Social History Narrative   Married, 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   Hunger Vital Sign    Worried About Running Out of Food in the Last Year: Never true    Ran Out of Food in the Last Year: Never true  Transportation Needs: No Transportation Needs (01/13/2024)   Received from [facility] - Transportation    Lack of Transportation (Medical): No    Lack of Transportation (Non-Medical): No  Physical Activity: Insufficiently Active (09/14/2023)   Received from [facility]   Exercise Vital Sign    Days of Exercise per Week: 2 days    Minutes of Exercise per Session: 20 min  Stress: No Stress Concern Present (09/14/2023)   Received from [facility] - Occupational Stress Questionnaire    Feeling of Stress : Not at all  Social Connections: Socially Integrated (09/14/2023)   Received from [facility]   Social Network    How would you rate your social network (family, work, friends)?: Good participation with social networks  Intimate Partner Violence: Not At Risk (09/14/2023)   Received from [facility]   HITS    Over the last 12 months how often did your partner physically hurt you?: Never    Over the last 12 months how often did your partner insult you or talk down to you?: Never    Over the last 12 months how  often did your partner threaten you with physical harm?: Never    Over the last 12 months how often did your partner scream or curse at you?: Never    Medications:   Current Outpatient Medications on File Prior to Visit  Medication Sig Dispense Refill   Calcium Carbonate-Vit D-Min (CALTRATE 600+D PLUS MINERALS) 600-800 MG-UNIT TABS Take 2 tablets by mouth in the morning and at bedtime.     Cholecalciferol (VITAMIN D3 PO) Take 1 capsule by mouth daily.     denosumab (PROLIA) 60 MG/ML SOSY injection      diphenoxylate-atropine (LOMOTIL) 2.5-0.025 MG tablet Take 1 tablet by mouth every 6 (six) hours as needed for diarrhea or loose stools. 60 tablet 3   ezetimibe (ZETIA) 10 MG tablet Take 1 tablet (10 mg total) by mouth daily. 90 tablet 3   metoprolol tartrate (LOPRESSOR) 25  MG tablet Take 1 tablet (25 mg total) by mouth 2 (two) times daily. 30 tablet 0   mycophenolate (CELLCEPT) 250 MG capsule Take 250 mg by mouth 2 (two) times daily.     rifaximin (XIFAXAN) 550 MG TABS tablet Take 550 mg by mouth 3 (three) times daily. X 14 days RX faxed to [facility], fax # [phone], phone # [phone]     Specialty Vitamins Products (MG PLUS PROTEIN) 133 MG TABS Take 3 tablets by mouth in the morning and at bedtime.     tacrolimus (PROGRAF) 1 MG capsule Take 4 capsules (4 mg total) by mouth every 12 (twelve) hours. (Patient taking differently: Take 3 mg by mouth every 12 (twelve) hours. Take 3 capsules by mouth every Morning, and 2 capsules at bedtime.) 60 capsule 1   No current facility-administered medications on file prior to visit.    Allergies:   Allergies  Allergen Reactions   Atorvastatin Other (See Comments)    Muscle pain and cramps   Rosuvastatin Other (See Comments)    Muscle pain and cramps    Physical Exam General: Pleasant mildly obese middle-age Caucasian lady seated, in no evident distress Head: head normocephalic and atraumatic.  Neck: supple with no carotid or  supraclavicular bruits Cardiovascular: regular rate and rhythm, no murmurs Musculoskeletal: no deformity Skin:  no rash/petichiae Vascular:  Normal pulses all extremities Vitals:   02/10/24 1403  BP: (!) 150/79  Pulse: 71   Neurologic Exam Mental Status: Awake and fully alert. Oriented to place and time. Recent and remote memory intact. Attention span, concentration and fund of knowledge appropriate. Mood and affect appropriate.  Cranial Nerves: Fundoscopic exam reveals sharp disc margins. Pupils equal, briskly reactive to light. Extraocular movements full without nystagmus. Visual fields full to confrontation. Hearing intact. Facial sensation intact. Face, tongue, palate moves normally and symmetrically.  Motor: Normal bulk and tone. Normal strength in all tested extremity muscles.  Mild diminished fine finger movements on the left.  Orbits right over left upper extremity. Sensory.: intact to touch ,pinprick .position and vibratory sensation.  Coordination: Rapid alternating movements normal in all extremities. Finger-to-nose and heel-to-shin performed accurately bilaterally. Gait and Station: Arises from chair without difficulty. Stance is normal. Gait demonstrates normal stride length and balance . Able to heel, toe and tandem walk with only slight difficulty.  Reflexes: 1+ and symmetric. Toes downgoing.   NIHSS  0 Modified Rankin  0   ASSESSMENT: 68 year old Caucasian lady with right subcortical lacunar infarct in November 2024.Marland Kitchen  Doing very well with no residual deficits.  Stroke risk factors of hypertension, hyperlipidemia, silent cerebrovascular and mild obesity.     PLAN:I had a long d/w patient about her  recent lacunar stroke, risk for recurrent stroke/TIAs, personally independently reviewed imaging studies and stroke evaluation results and answered questions.Continue plavix 75 mg daily   for secondary stroke prevention and maintain strict control of hypertension with blood  pressure goal below 130/90, diabetes with hemoglobin A1c goal below 6.5% and lipids with LDL cholesterol goal below 70 mg/dL. I also advised the patient to eat a healthy diet with plenty of whole grains, cereals, fruits and vegetables, exercise regularly and maintain ideal body weight check follow-up lipid profile and if not satisfactory may consider Leqvio injections as patient is intolerant to statins..  Followup in the future with my nurse practitioner in 6 months or call earlier if necessary.  Greater than 50% of time during this 35 minute visit was spent on counseling,explanation of  diagnosis of lacunar stroke, planning of further management, discussion with patient and family and coordination of care Delia Heady, MD Note: This document was prepared with digital dictation and possible smart phrase technology. Any transcriptional errors that result from this process are unintentional

## 2024-02-10 NOTE — Patient Instructions (Signed)
I had a long d/w patient about her  recent lacunar stroke, risk for recurrent stroke/TIAs, personally independently reviewed imaging studies and stroke evaluation results and answered questions.Continue plavix 75 mg daily   for secondary stroke prevention and maintain strict control of hypertension with blood pressure goal below 130/90, diabetes with hemoglobin A1c goal below 6.5% and lipids with LDL cholesterol goal below 70 mg/dL. I also advised the patient to eat a healthy diet with plenty of whole grains, cereals, fruits and vegetables, exercise regularly and maintain ideal body weight check follow-up lipid profile and if not satisfactory may consider Leqvio injections as patient is intolerant to statins..  Followup in the future with my nurse practitioner in 6 months or call earlier if necessary.  Stroke Prevention Some medical conditions and behaviors can lead to a higher chance of having a stroke. You can help prevent a stroke by eating healthy, exercising, not smoking, and managing any medical conditions you have. Stroke is a leading cause of functional impairment. Primary prevention is particularly important because a majority of strokes are first-time events. Stroke changes the lives of not only those who experience a stroke but also their family and other caregivers. How can this condition affect me? A stroke is a medical emergency and should be treated right away. A stroke can lead to brain damage and can sometimes be life-threatening. If a person gets medical treatment right away, there is a better chance of surviving and recovering from a stroke. What can increase my risk? The following medical conditions may increase your risk of a stroke: Cardiovascular disease. High blood pressure (hypertension). Diabetes. High cholesterol. Sickle cell disease. Blood clotting disorders (hypercoagulable state). Obesity. Sleep disorders (obstructive sleep apnea). Other risk factors include: Being  older than age 78. Having a history of blood clots, stroke, or mini-stroke (transient ischemic attack, TIA). Genetic factors, such as race, ethnicity, or a family history of stroke. Smoking cigarettes or using other tobacco products. Taking birth control pills, especially if you also use tobacco. Heavy use of alcohol or drugs, especially cocaine and methamphetamine. Physical inactivity. What actions can I take to prevent this? Manage your health conditions High cholesterol levels. Eating a healthy diet is important for preventing high cholesterol. If cholesterol cannot be managed through diet alone, you may need to take medicines. Take any prescribed medicines to control your cholesterol as told by your health care provider. Hypertension. To reduce your risk of stroke, try to keep your blood pressure below 130/80. Eating a healthy diet and exercising regularly are important for controlling blood pressure. If these steps are not enough to manage your blood pressure, you may need to take medicines. Take any prescribed medicines to control hypertension as told by your health care provider. Ask your health care provider if you should monitor your blood pressure at home. Have your blood pressure checked every year, even if your blood pressure is normal. Blood pressure increases with age and some medical conditions. Diabetes. Eating a healthy diet and exercising regularly are important parts of managing your blood sugar (glucose). If your blood sugar cannot be managed through diet and exercise, you may need to take medicines. Take any prescribed medicines to control your diabetes as told by your health care provider. Get evaluated for obstructive sleep apnea. Talk to your health care provider about getting a sleep evaluation if you snore a lot or have excessive sleepiness. Make sure that any other medical conditions you have, such as atrial fibrillation or atherosclerosis, are  managed.  Nutrition Follow instructions from your health care provider about what to eat or drink to help manage your health condition. These instructions may include: Reducing your daily calorie intake. Limiting how much salt (sodium) you use to 1,500 milligrams (mg) each day. Using only healthy fats for cooking, such as olive oil, canola oil, or sunflower oil. Eating healthy foods. You can do this by: Choosing foods that are high in fiber, such as whole grains, and fresh fruits and vegetables. Eating at least 5 servings of fruits and vegetables a day. Try to fill one-half of your plate with fruits and vegetables at each meal. Choosing lean protein foods, such as lean cuts of meat, poultry without skin, fish, tofu, beans, and nuts. Eating low-fat dairy products. Avoiding foods that are high in sodium. This can help lower blood pressure. Avoiding foods that have saturated fat, trans fat, and cholesterol. This can help prevent high cholesterol. Avoiding processed and prepared foods. Counting your daily carbohydrate intake.  Lifestyle If you drink alcohol: Limit how much you have to: 0-1 drink a day for women who are not pregnant. 0-2 drinks a day for men. Know how much alcohol is in your drink. In the [place] one drink equals one 12 oz bottle of beer ( ), one 5 oz glass of wine ( ), or one 1 oz glass of hard liquor (44mL). Do not use any products that contain nicotine or tobacco. These products include cigarettes, chewing tobacco, and vaping devices, such as e-cigarettes. If you need help quitting, ask your health care provider. Avoid secondhand smoke. Do not use drugs. Activity  Try to stay at a healthy weight. Get at least 30 minutes of exercise on most days, such as: Fast walking. Biking. Swimming. Medicines Take over-the-counter and prescription medicines only as told by your health care provider. Aspirin or blood thinners (antiplatelets or anticoagulants) may be recommended  to reduce your risk of forming blood clots that can lead to stroke. Avoid taking birth control pills. Talk to your health care provider about the risks of taking birth control pills if: You are over 70 years old. You smoke. You get very bad headaches. You have had a blood clot. Where to find more information American Stroke Association: www.strokeassociation.org Get help right away if: You or a loved one has any symptoms of a stroke. "BE FAST" is an easy way to remember the main warning signs of a stroke: B - Balance. Signs are dizziness, sudden trouble walking, or loss of balance. E - Eyes. Signs are trouble seeing or a sudden change in vision. F - Face. Signs are sudden weakness or numbness of the face, or the face or eyelid drooping on one side. A - Arms. Signs are weakness or numbness in an arm. This happens suddenly and usually on one side of the body. S - Speech. Signs are sudden trouble speaking, slurred speech, or trouble understanding what people say. T - Time. Time to call emergency services. Write down what time symptoms started. You or a loved one has other signs of a stroke, such as: A sudden, severe headache with no known cause. Nausea or vomiting. Seizure. These symptoms may represent a serious problem that is an emergency. Do not wait to see if the symptoms will go away. Get medical help right away. Call your local emergency services (911 in the [place] Do not drive yourself to the hospital. Summary You can help to prevent a stroke by eating healthy, exercising, not smoking, limiting alcohol intake, and managing any  medical conditions you may have. Do not use any products that contain nicotine or tobacco. These include cigarettes, chewing tobacco, and vaping devices, such as e-cigarettes. If you need help quitting, ask your health care provider. Remember "BE FAST" for warning signs of a stroke. Get help right away if you or a loved one has any of these signs. This information  is not intended to replace advice given to you by your health care provider. Make sure you discuss any questions you have with your health care provider. Document Revised: 11/09/2022 Document Reviewed: 11/09/2022 Elsevier Patient Education  2024 [employer].

## 2024-02-11 LAB — LIPID PANEL
Chol/HDL Ratio: 4 {ratio} (ref 0.0–4.4)
Cholesterol, Total: 203 mg/dL — ABNORMAL HIGH (ref 100–199)
HDL: 51 mg/dL (ref >39)
LDL Chol Calc (NIH): 75 mg/dL (ref 0–99)
Triglycerides: 489 mg/dL — ABNORMAL HIGH (ref 0–149)
VLDL Cholesterol Cal: 77 mg/dL — ABNORMAL HIGH (ref 5–40)

## 2024-02-15 ENCOUNTER — Encounter: Payer: Self-pay | Admitting: Neurology

## 2024-02-28 ENCOUNTER — Telehealth: Payer: Self-pay

## 2024-02-28 NOTE — Telephone Encounter (Signed)
 Contacted pt, inform her cholesterol profile is nearly at goal. No medication changes needed at this time, per MD. advised to call the office back with any questions or concerns as she had none at this time.  Patient verbally understood and was appreciative.

## 2024-02-28 NOTE — Progress Notes (Signed)
 Kindly inform the patient that cholesterol profile is nearly at goal.  No medication changes needed at this time

## 2024-02-28 NOTE — Telephone Encounter (Signed)
-----   Message from Delia Heady sent at 02/28/2024  4:10 PM EDT ----- Dawn Moon inform the patient that cholesterol profile is nearly at goal.  No medication changes needed at this time

## 2024-03-03 NOTE — Progress Notes (Signed)
 HPI: FU CAD, hypertension and volume excess. CardioNet 2011 showed 4 beats of nonsustained ventricular tachycardia; also brief atrial tachycardia; note some of symptoms also noted to be sinus tachycardia. Patient was seen by Dr. Graciela Husbands and started on verapamil. Her symptoms did not improve and she was changed to a beta blocker. Her symptoms improved with atenolol.  Echocardiogram at [facility] October 2022 showed normal LV function and late positive microcavitation study consistent with extracardiac shunting.  Cardiac catheterization December 2022 showed 60% distal LAD.  Medical therapy recommended.  Patient underwent liver transplant at [facility] June 2023 (history of NASH).  Had CVA November 2024.  Carotid Dopplers November 2024 showed 1 to 39% right and left stenosis.  Echocardiogram November 2024 showed normal LV function, grade 1 diastolic dysfunction.  MRA of the head November 2024 normal.  Monitor December 2024 showed sinus rhythm with no atrial fibrillation noted.  Since last seen, she denies dyspnea, chest pain, palpitations or syncope.  Current Outpatient Medications  Medication Sig Dispense Refill   Calcium Carbonate-Vit D-Min (CALTRATE 600+D PLUS MINERALS) 600-800 MG-UNIT TABS Take 2 tablets by mouth in the morning and at bedtime.     Cholecalciferol (VITAMIN D3 PO) Take 1 capsule by mouth daily.     denosumab (PROLIA) 60 MG/ML SOSY injection      diphenoxylate-atropine (LOMOTIL) 2.5-0.025 MG tablet Take 1 tablet by mouth every 6 (six) hours as needed for diarrhea or loose stools. 60 tablet 3   ezetimibe (ZETIA) 10 MG tablet Take 1 tablet (10 mg total) by mouth daily. 90 tablet 3   metoprolol tartrate (LOPRESSOR) 25 MG tablet Take 1 tablet (25 mg total) by mouth 2 (two) times daily. 30 tablet 0   mycophenolate (CELLCEPT) 250 MG capsule Take 250 mg by mouth 2 (two) times daily.     Specialty Vitamins Products (MG PLUS PROTEIN) 133 MG TABS Take 3 tablets by mouth in the morning  and at bedtime.     tacrolimus (PROGRAF) 1 MG capsule Take 4 capsules (4 mg total) by mouth every 12 (twelve) hours. (Patient taking differently: Take 3 mg by mouth every 12 (twelve) hours. Take 3 capsules by mouth every Morning, and 2 capsules at bedtime.) 60 capsule 1   No current facility-administered medications for this visit.     Past Medical History:  Diagnosis Date   Ascites    Dysrhythmia    hx of SVT   Encephalopathy, hepatic (HCC) 05/13/2018   GERD (gastroesophageal reflux disease)    History of exercise stress test    03-07-2010  Stress echo--- no arrhythmias or conduction abnormalilites and negative for ischemia or chest pain   History of kidney stones    History of paroxysmal supraventricular tachycardia    episode 2011  consult w/ dr Graciela Husbands --  put on atenolol--  per pt no longer an issue   HTN (hypertension)    cardiologist --  dr Jens Som   IBS (irritable bowel syndrome)    diarrhea   Nonalcoholic steatohepatitis (NASH)    Numbness and tingling of left lower extremity    post achilles tendon repair   OA (osteoarthritis)    knees   Osteoporosis    Pleural effusion on right    hepatic hydrothorax   PMB (postmenopausal bleeding) none recent   thickened endometrium   PONV (postoperative nausea and vomiting)    Recurrent UTI none since 2019   Secondary esophageal varices without bleeding (HCC) 06/03/2018   Vitamin D deficiency  Wears glasses     Past Surgical History:  Procedure Laterality Date   ACHILLES TENDON SURGERY Left 06/2016   BIOPSY  11/25/2021   Procedure: BIOPSY;  Surgeon: Iva Boop, MD;  Location: [facility];  Service: Endoscopy;;   BREAST BIOPSY Left 02/2016   benign   CARDIAC CATHETERIZATION     COLONOSCOPY  2005   COLONOSCOPY WITH PROPOFOL N/A 11/25/2021   Procedure: COLONOSCOPY WITH PROPOFOL;  Surgeon: Iva Boop, MD;  Location: [facility];  Service: Endoscopy;  Laterality: N/A;   CT CTA CORONARY W/CA SCORE W/CM &/OR WO/CM   01/01/2014   non-obstructive calcified plaque in pLAD (0-25%), no significant incidental noncardiac findings noted   DILATATION & CURETTAGE/HYSTEROSCOPY WITH MYOSURE N/A 12/02/2020   Procedure: DILATATION & CURETTAGE/HYSTEROSCOPY WITH POLYPECTOMY AND REMOVAL OF VAGINAL LESION;  Surgeon: Theresia Majors, MD;  Location: [facility];  Service: Gynecology;  Laterality: N/A;  request to follow in [facility] iqueue held (DR. Penni Bombard has two other cases that morning starting 7:30am)   ESOPHAGOGASTRODUODENOSCOPY  01/2014   EXTRACORPOREAL SHOCK WAVE LITHOTRIPSY  2010   KNEE ARTHROPLASTY Right 03/24/2018   Procedure: RIGHT TOTAL KNEE ARTHROPLASTY WITH COMPUTER NAVIGATION;  Surgeon: Samson Frederic, MD;  Location: [facility];  Service: Orthopedics;  Laterality: Right;  Needs RNFA   KNEE ARTHROSCOPY Right 12/04/2016   Procedure: ARTHROSCOPY KNEE WITH PARTIAL MEDIAL MENISCECTOMY;  Surgeon: Samson Frederic, MD;  Location: [facility];  Service: Orthopedics;  Laterality: Right;   LEFT HEART CATH AND CORONARY ANGIOGRAPHY N/A 11/20/2021   Procedure: LEFT HEART CATH AND CORONARY ANGIOGRAPHY;  Surgeon: Kathleene Hazel, MD;  Location: [facility] INVASIVE CV LAB;  Service: Cardiovascular;  Laterality: N/A;   LEFT HEART CATHETERIZATION WITH CORONARY ANGIOGRAM N/A 01/11/2012   Procedure: LEFT HEART CATHETERIZATION WITH CORONARY ANGIOGRAM;  Surgeon: Tonny Bollman, MD;  Location: [facility] CATH LAB;  Service: Cardiovascular;  Laterality: N/A;  widely patent coronary arterires without significant obstructive CAD,  normal LVF, ef 55-56%   LIVER TRANSPLANTATION  06/03/2022   TONSILLECTOMY AND ADENOIDECTOMY  child   TRANSTHORACIC ECHOCARDIOGRAM  01/18/2012   mild LVH,  ef 60%/  trivial TR   TUBAL LIGATION  1985   UPPER GASTROINTESTINAL ENDOSCOPY  last done 2019    Social History   Socioeconomic History   Marital status: Married    Spouse name: Not on file   Number of children: 3   Years of  education: 16   Highest education level: Bachelor's degree (e.g., BA, AB, BS)  Occupational History   Occupation: inpatient case Event organiser: Advertising copywriter  Tobacco Use   Smoking status: Never   Smokeless tobacco: Never  Vaping Use   Vaping status: Never Used  Substance and Sexual Activity   Alcohol use: Not Currently   Drug use: No   Sexual activity: Yes    Birth control/protection: Post-menopausal    Comment: 1st intercourse 68 yo-Fewer than 5 partners  Other Topics Concern   Not on file  Social History Narrative   Married, 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   Has daughters - one is former [facility] EP NP (Counselling psychologist) now working in Interior and spatial designer   rare EtOH, never smoker, no drugs   Social Drivers of Corporate investment banker Strain: Low Risk  (01/13/2024)   Received from [employer]   Overall Financial Resource Strain (CARDIA)    Difficulty of Paying Living Expenses: Not hard at all  Food Insecurity:  No Food Insecurity (01/13/2024)   Received from [facility]   Hunger Vital Sign    Worried About Running Out of Food in the Last Year: Never true    Ran Out of Food in the Last Year: Never true  Transportation Needs: No Transportation Needs (01/13/2024)   Received from [facility] - Transportation    Lack of Transportation (Medical): No    Lack of Transportation (Non-Medical): No  Physical Activity: Insufficiently Active (09/14/2023)   Received from [facility]   Exercise Vital Sign    Days of Exercise per Week: 2 days    Minutes of Exercise per Session: 20 min  Stress: No Stress Concern Present (09/14/2023)   Received from [facility] - Occupational Stress Questionnaire    Feeling of Stress : Not at all  Social Connections: Socially Integrated (09/14/2023)   Received from [facility]   Social Network    How would you rate your social network (family, work, friends)?: Good  participation with social networks  Intimate Partner Violence: Not At Risk (09/14/2023)   Received from [facility]   HITS    Over the last 12 months how often did your partner physically hurt you?: Never    Over the last 12 months how often did your partner insult you or talk down to you?: Never    Over the last 12 months how often did your partner threaten you with physical harm?: Never    Over the last 12 months how often did your partner scream or curse at you?: Never    Family History  Problem Relation Age of Onset   Lymphoma Mother        non-hodgkins - died @ 76   Coronary artery disease Mother    Coronary artery disease Father        CABG in his 62s, died @ 64   Diabetes Father    Healthy Brother    Hypertension Sister    Arthritis Sister    IgA nephropathy Daughter    Kidney failure Daughter    Celiac disease Daughter    Breast cancer Maternal Aunt    Colon cancer Neg Hx    Esophageal cancer Neg Hx    Stomach cancer Neg Hx    Rectal cancer Neg Hx    Ovarian cancer Neg Hx    Cervical cancer Neg Hx     ROS: no fevers or chills, productive cough, hemoptysis, dysphasia, odynophagia, melena, hematochezia, dysuria, hematuria, rash, seizure activity, orthopnea, PND, pedal edema, claudication. Remaining systems are negative.  Physical Exam: Well-developed well-nourished in no acute distress.  Skin is warm and dry.  HEENT is normal.  Neck is supple.  Chest is clear to auscultation with normal expansion.  Cardiovascular exam is regular rate and rhythm.  Abdominal exam nontender or distended. No masses palpated. Extremities show no edema. neuro grossly intact  A/P  1 coronary artery disease-patient doing well with no chest pain.  Continue aspirin.  Intolerant to statins.  2 history of CVA-continue Plavix.  3 hypertension-patient's blood pressure is elevated.  However she states typically controlled.  She will follow this at home and we will advance regimen as  needed.  4 status post liver transplant-followed at [facility].  5 palpitations-symptoms are controlled.  Continue beta-blocker.  6 hyperlipidemia-continue Zetia.  Intolerant to statins.  Most recent LDL not at goal.  Will refer to lipid clinic for PCSK9 inhibitor.  Olga Millers,  MD

## 2024-03-14 ENCOUNTER — Other Ambulatory Visit (INDEPENDENT_AMBULATORY_CARE_PROVIDER_SITE_OTHER)

## 2024-03-14 ENCOUNTER — Ambulatory Visit (INDEPENDENT_AMBULATORY_CARE_PROVIDER_SITE_OTHER)
Admission: RE | Admit: 2024-03-14 | Discharge: 2024-03-14 | Disposition: A | Discharge status: Home / Self Care | Source: Ambulatory Visit | Attending: Physician Assistant | Admitting: Physician Assistant

## 2024-03-14 ENCOUNTER — Ambulatory Visit (INDEPENDENT_AMBULATORY_CARE_PROVIDER_SITE_OTHER): Payer: Medicare Other | Admitting: Physician Assistant

## 2024-03-14 ENCOUNTER — Encounter: Payer: Self-pay | Admitting: Physician Assistant

## 2024-03-14 VITALS — BP 110/76 | HR 67 | Ht 63.0 in | Wt 183.0 lb

## 2024-03-14 DIAGNOSIS — A09 Infectious gastroenteritis and colitis, unspecified: Secondary | ICD-10-CM

## 2024-03-14 DIAGNOSIS — K7581 Nonalcoholic steatohepatitis (NASH): Secondary | ICD-10-CM | POA: Diagnosis not present

## 2024-03-14 DIAGNOSIS — Z944 Liver transplant status: Secondary | ICD-10-CM

## 2024-03-14 DIAGNOSIS — K7460 Unspecified cirrhosis of liver: Secondary | ICD-10-CM

## 2024-03-14 DIAGNOSIS — R159 Full incontinence of feces: Secondary | ICD-10-CM | POA: Diagnosis not present

## 2024-03-14 DIAGNOSIS — K219 Gastro-esophageal reflux disease without esophagitis: Secondary | ICD-10-CM

## 2024-03-14 DIAGNOSIS — K580 Irritable bowel syndrome with diarrhea: Secondary | ICD-10-CM

## 2024-03-14 DIAGNOSIS — Z8673 Personal history of transient ischemic attack (TIA), and cerebral infarction without residual deficits: Secondary | ICD-10-CM

## 2024-03-14 LAB — SEDIMENTATION RATE: Sed Rate: 16 mm/h (ref 0–30)

## 2024-03-14 MED ORDER — RIFAXIMIN 550 MG PO TABS: 550.0000 mg | ORAL_TABLET | Freq: Three times a day (TID) | ORAL | 0 refills | Status: AC

## 2024-03-14 NOTE — Patient Instructions (Addendum)
 Your provider has requested that you go to the basement level for lab work before leaving today. Press "B" on the elevator. The lab is located at the first door on the left as you exit the elevator.  I believe your loose stools may be due to something called overflow diarrhea. If you predominantly have constipation, straining or incomplete bowel movements at times the only thing to get through the large amounts of stool is liquid. I describe it like rocks in a tube, if you pour water into the tube, only water will come out with occ rocks(stools).  This can cause someone to feel like they are having diarrhea yet actually they have too much backup of stool. We can evaluate this with a rectal exam, and x-ray of your abdomen, or CT scan. If this is the case usually doing a combination of Benefiber 1-2 times daily with MiraLAX 17 g daily can be helpful. At times there are medications that can also be helpful for this but generally I start with over-the-counter I also like somebody to get a squatty potty to help straighten out the colon whether having a bowel movement. Sometimes we will schedule for pelvic floor physical therapy if you are having incomplete bowel movements and failure pelvic floor is weak and contributing to the constipation.  Benefiber or Citracel is good for constipation/diarrhea/irritable bowel syndrome, it helps with weight loss and can help lower your bad cholesterol. Please do 1 TBSP in the morning in water, coffee, or tea up to twice a day. It can take up to a month before you can see a difference with your bowel movements. It is cheapest from [employer], [employer], [employer].   Miralax is an osmotic laxative.  It only brings more water into the stool.  This is safe to take daily.  Can take up to 17 gram of miralax twice a day.  Mix with juice or coffee.  Start 1 capful at night for 3-4 days and reassess your response in 3-4 days.  You can increase and decrease the dose based on your  response.  Remember, it can take up to 3-4 days to take effect OR for the effects to wear off.   I often pair this with benefiber in the morning to help assure the stool is not too loose.   Here some information about pelvic floor dysfunction. This may be contributing to some of your symptoms. We will continue with our evaluation but I do want you to consider adding on fiber supplement with low-dose MiraLAX daily. We could also refer to pelvic floor physical therapy.   Pelvic Floor Dysfunction, Female Pelvic floor dysfunction (PFD) is a condition that results when the group of muscles and connective tissues that support the organs in the pelvis (pelvic floor muscles) do not work well. These muscles and their connections form a sling that supports the colon and bladder. In women, they also support the uterus. PFD causes pelvic floor muscles to be too weak, too tight, or both. In PFD, muscle movements are not coordinated. This may cause bowel or bladder problems. It may also cause pain. What are the causes? This condition may be caused by an injury to the pelvic area or by a weakening of pelvic muscles. This often results from pregnancy and childbirth or other types of strain. In many cases, the exact cause is not known. What increases the risk? The following factors may make you more likely to develop this condition: Having chronic bladder tissue inflammation (interstitial  cystitis). Being an older person. Being overweight. History of radiation treatment for cancer in the pelvic region. Previous pelvic surgery, such as removal of the uterus (hysterectomy). What are the signs or symptoms? Symptoms of this condition vary and may include: Bladder symptoms, such as: Trouble starting urination and emptying the bladder. Frequent urinary tract infections. Leaking urine when coughing, laughing, or exercising (stress incontinence). Having to pass urine urgently or frequently. Pain when passing  urine. Bowel symptoms, such as: Constipation. Urgent or frequent bowel movements. Incomplete bowel movements. Painful bowel movements. Leaking stool or gas. Unexplained genital or rectal pain. Genital or rectal muscle spasms. Low back pain. Other symptoms may include: A heavy, full, or aching feeling in the vagina. A bulge that protrudes into the vagina. Pain during or after sex. How is this diagnosed? This condition may be diagnosed based on: Your symptoms and medical history. A physical exam. During the exam, your health care provider may check your pelvic muscles for tightness, spasm, pain, or weakness. This may include a rectal exam and a pelvic exam. In some cases, you may have diagnostic tests, such as: Electrical muscle function tests. Urine flow testing. X-ray tests of bowel function. Ultrasound of the pelvic organs. How is this treated? Treatment for this condition depends on the symptoms. Treatment options include: Physical therapy. This may include Kegel exercises to help relax or strengthen the pelvic floor muscles. Biofeedback. This type of therapy provides feedback on how tight your pelvic floor muscles are so that you can learn to control them. Internal or external massage therapy. A treatment that involves electrical stimulation of the pelvic floor muscles to help control pain (transcutaneous electrical nerve stimulation, or TENS). Sound wave therapy (ultrasound) to reduce muscle spasms. Medicines, such as: Muscle relaxants. Bladder control medicines. Surgery to reconstruct or support pelvic floor muscles may be an option if other treatments do not help. Follow these instructions at home: Activity Do your usual activities as told by your health care provider. Ask your health care provider if you should modify any activities. Do pelvic floor strengthening or relaxing exercises at home as told by your physical therapist. Lifestyle Maintain a healthy weight. Eat  foods that are high in fiber, such as beans, whole grains, and fresh fruits and vegetables. Limit foods that are high in fat and processed sugars, such as fried or sweet foods. Manage stress with relaxation techniques such as yoga or meditation. General instructions If you have problems with leakage: Use absorbable pads or wear padded underwear. Wash frequently with mild soap. Keep your genital and anal area as clean and dry as possible. Ask your health care provider if you should try a barrier cream to prevent skin irritation. Take warm baths to relieve pelvic muscle tension or spasms. Take over-the-counter and prescription medicines only as told by your health care provider. Keep all follow-up visits. How is this prevented? The cause of PFD is not always known, but there are a few things you can do to reduce the risk of developing this condition, including: Staying at a healthy weight. Getting regular exercise. Managing stress. Contact a health care provider if: Your symptoms are not improving with home care. You have signs or symptoms of PFD that get worse at home. You develop new signs or symptoms. You have signs of a urinary tract infection, such as: Fever. Chills. Increased urinary frequency. A burning feeling when urinating. You have not had a bowel movement in 3 days (constipation). Summary Pelvic floor dysfunction results when the  muscles and connective tissues in your pelvic floor do not work well. These muscles and their connections form a sling that supports your colon and bladder. In women, they also support the uterus. PFD may be caused by an injury to the pelvic area or by a weakening of pelvic muscles. PFD causes pelvic floor muscles to be too weak, too tight, or a combination of both. Symptoms may vary from person to person. In most cases, PFD can be treated with physical therapies and medicines. Surgery may be an option if other treatments do not help. This  information is not intended to replace advice given to you by your health care provider. Make sure you discuss any questions you have with your health care provider. Document Revised: 04/16/2021 Document Reviewed: 04/16/2021 Elsevier Patient Education  2022 [employer].
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
## 2024-03-15 ENCOUNTER — Ambulatory Visit: Payer: Medicare Other | Attending: Cardiology | Admitting: Cardiology

## 2024-03-15 VITALS — BP 170/80 | HR 60 | Ht 63.0 in | Wt 181.2 lb

## 2024-03-15 DIAGNOSIS — E782 Mixed hyperlipidemia: Secondary | ICD-10-CM | POA: Diagnosis not present

## 2024-03-15 DIAGNOSIS — R002 Palpitations: Secondary | ICD-10-CM | POA: Insufficient documentation

## 2024-03-15 DIAGNOSIS — I10 Essential (primary) hypertension: Secondary | ICD-10-CM | POA: Diagnosis present

## 2024-03-15 DIAGNOSIS — I2510 Atherosclerotic heart disease of native coronary artery without angina pectoris: Secondary | ICD-10-CM | POA: Insufficient documentation

## 2024-03-15 NOTE — Patient Instructions (Signed)
   Follow-Up: At [facility], you and your health needs are our priority.  As part of our continuing mission to provide you with exceptional heart care, we have created designated Provider Care Teams.  These Care Teams include your primary Cardiologist (physician) and Advanced Practice Providers (APPs -  Physician Assistants and Nurse Practitioners) who all work together to provide you with the care you need, when you need it.    Your next appointment:   6 month(s)  Provider:   Olga Millers, MD

## 2024-03-16 ENCOUNTER — Other Ambulatory Visit

## 2024-03-16 DIAGNOSIS — A09 Infectious gastroenteritis and colitis, unspecified: Secondary | ICD-10-CM

## 2024-03-16 DIAGNOSIS — K7581 Nonalcoholic steatohepatitis (NASH): Secondary | ICD-10-CM

## 2024-03-16 DIAGNOSIS — K7460 Unspecified cirrhosis of liver: Secondary | ICD-10-CM

## 2024-03-16 DIAGNOSIS — K580 Irritable bowel syndrome with diarrhea: Secondary | ICD-10-CM

## 2024-03-17 LAB — GIARDIA AND CRYPTOSPORIDIUM ANTIGEN PANEL
MICRO NUMBER:: 16255897
RESULT:: NOT DETECTED
SPECIMEN QUALITY:: ADEQUATE
Specimen Quality:: ADEQUATE
micro Number:: 16255896

## 2024-03-22 LAB — C. DIFFICILE GDH AND TOXIN A/B
GDH ANTIGEN: NOT DETECTED
MICRO NUMBER:: 16256019
SPECIMEN QUALITY:: ADEQUATE
TOXIN A AND B: NOT DETECTED

## 2024-03-22 LAB — SALMONELLA/SHIGELLA CULT, CAMPY EIA AND SHIGA TOXIN RFL ECOLI
MICRO NUMBER: 16255917
MICRO NUMBER:: 16255918
MICRO NUMBER:: 16255919
Result:: NOT DETECTED
SHIGA RESULT:: NOT DETECTED
SPECIMEN QUALITY: ADEQUATE
SPECIMEN QUALITY:: ADEQUATE
SPECIMEN QUALITY:: ADEQUATE

## 2024-03-27 ENCOUNTER — Other Ambulatory Visit: Payer: Self-pay | Admitting: Gastroenterology

## 2024-03-30 MED ORDER — DIPHENOXYLATE-ATROPINE 2.5-0.025 MG PO TABS: 1.0000 | ORAL_TABLET | Freq: Four times a day (QID) | ORAL | 1 refills | Status: DC | PRN

## 2024-03-30 NOTE — Addendum Note (Signed)
 Addended by: Quentin Mulling on: 03/30/2024 07:34 AM   Modules accepted: Orders

## 2024-04-18 ENCOUNTER — Telehealth (HOSPITAL_BASED_OUTPATIENT_CLINIC_OR_DEPARTMENT_OTHER): Payer: Self-pay | Admitting: Pharmacist Clinician (PhC)/ Clinical Pharmacy Specialist

## 2024-04-18 ENCOUNTER — Ambulatory Visit (HOSPITAL_BASED_OUTPATIENT_CLINIC_OR_DEPARTMENT_OTHER): Admitting: Pharmacist Clinician (PhC)/ Clinical Pharmacy Specialist

## 2024-04-18 ENCOUNTER — Telehealth: Payer: Self-pay | Admitting: Pharmacy Technician

## 2024-04-18 ENCOUNTER — Other Ambulatory Visit (HOSPITAL_COMMUNITY): Payer: Self-pay

## 2024-04-18 ENCOUNTER — Encounter (HOSPITAL_BASED_OUTPATIENT_CLINIC_OR_DEPARTMENT_OTHER): Payer: Self-pay | Admitting: Pharmacist Clinician (PhC)/ Clinical Pharmacy Specialist

## 2024-04-18 DIAGNOSIS — E782 Mixed hyperlipidemia: Secondary | ICD-10-CM

## 2024-04-18 DIAGNOSIS — G720 Drug-induced myopathy: Secondary | ICD-10-CM

## 2024-04-18 DIAGNOSIS — T466X5D Adverse effect of antihyperlipidemic and antiarteriosclerotic drugs, subsequent encounter: Secondary | ICD-10-CM

## 2024-04-18 NOTE — Patient Instructions (Signed)
 Your Results:             Your most recent labs Goal  Total Cholesterol 203 < 200  Triglycerides 489 < 150  HDL (happy/good cholesterol) 51 > 40  LDL (lousy/bad cholesterol 75 < 55   Medication changes:  We will start the process to get Vascepa (icosapent ethyl) and Leqvio covered by your insurance.  Once the prior authorization is complete, I will send a MyChart message to let you know and confirm pharmacy information.   For the Vascepa you will take 2 capsules twice daily.   You will get a call from the [facility] regarding scheduling your Leqvio dose.    Lab orders:  We want to repeat labs after 2-3 months.  We will send you a lab order to remind you once we get closer to that time.    Patient Assistance:    We will sign you up for a Healthwell Grant once your medication is approved by [employer].  I will call you with the ID number, then you will take this information to the pharmacy.  They will bill it after your insurance, bringing your copay to $0.  The grant will pay the first $2,500 in a one year period.    ID   BIN [id number]  PCN [id number]  GRP [id number]    Thank you for choosing [facility]

## 2024-04-18 NOTE — Telephone Encounter (Signed)
 Insurance said the generic is not preferred. I ran brand and it went through with no errors. Copay is 316.97 for 30 days says because of a deductible. Sent to Kristin

## 2024-04-18 NOTE — Assessment & Plan Note (Signed)
 Assessment: Patient with ASCVD not at LDL goal of < 55 Most recent LDL 75 on 02/10/24 Not able to tolerate statins secondary to myalgias (atorvastatin, rosuvastatin) Reviewed options for lowering LDL cholesterol, including PCSK-9 inhibitors and inclisiran.  Discussed mechanisms of action, dosing, side effects, potential decreases in LDL cholesterol and costs.  Also reviewed potential options for patient assistance. Triglycerides elevated at 489 Plan: Patient agreeable to starting icosapent ethyl for triglycerides.  Fenofibrate contraindicated secondary to liver transplant Patient agreeable to starting Leqvio.  Note that patient has reviewed this with transplant team at [facility] and was told okay to use.   Repeat labs after:  4 months Lipid Liver function Patient was given information on [employer] - will sign patient up if high deductible/copays on icoaspent ethyl when PA approved Marital status Income < $75,000 (single) or < $105,000 (married)

## 2024-04-18 NOTE — Telephone Encounter (Signed)
 Please do PA for icosapent ethyl/Vascepa

## 2024-04-18 NOTE — Progress Notes (Signed)
 Office Visit    Patient Name: Dawn Moon Date of Encounter: 04/18/2024  Primary Care Provider:  Sallye Crease, NP Primary Cardiologist:  Alexandria Angel, MD  Chief Complaint    Hyperlipidemia   Significant Past Medical History   CAD 1-39% carotid stenosis (both L and R)  CVA 10/2023, on clopidogrel , no residual issues  Liver transplant 05/2022 ([facility])           Allergies  Allergen Reactions   Atorvastatin Other (See Comments)    Muscle pain and cramps   Rosuvastatin Other (See Comments)    Muscle pain and cramps    History of Present Illness    Dawn Moon is a 68 y.o. female patient of Dr Audery Blazing, in the office today to discuss options for cholesterol management.  Insurance Carrier: [employer] AARP Plan G  Pharmacy:   [facility]:  perhaps, depends on cost    LDL Cholesterol goal:    Current Medications:  ezetimibe  10 mg every day    Previously tried:  atorvastatin, rosuvastatin - myalgias, took about 2 weeks to recover each time  Family Hx: mother had familial high cholesterol, dad had embolic stroke at 63, then more later on , DM, CABG; brother healthy, sister high cholesterol; 3 daughters (one with kidney transplant)  Social Hx: Tobacco: no Alcohol :    no  Diet:    eating more fast food, travelling to take care of fil; doesn't snack much; protein - some shakes at first after translplant, now fish, some beef and chicken; vegetables when home usually frozen   Exercise: water aerobics - started this week twice week and chair yoga once weekly   Accessory Clinical Findings   Lab Results  Component Value Date   CHOL 203 (H) 02/10/2024   HDL 51 02/10/2024   LDLCALC 75 02/10/2024   LDLDIRECT 66.0 09/05/2019   TRIG 489 (H) 02/10/2024   CHOLHDL 4.0 02/10/2024    No results found for: "LIPOA"  Lab Results  Component Value Date   ALT 13 10/24/2023   AST 18 10/24/2023   ALKPHOS 45 10/24/2023   BILITOT 0.8 10/24/2023    Lab Results  Component Value Date   CREATININE 1.24 (H) 10/25/2023   BUN 18 10/25/2023   NA 138 10/25/2023   K 4.7 10/25/2023   CL 104 10/25/2023   CO2 26 10/25/2023   Lab Results  Component Value Date   HGBA1C 5.3 10/25/2023    Home Medications    Current Outpatient Medications  Medication Sig Dispense Refill   Calcium  Carbonate-Vit D-Min (CALTRATE 600+D PLUS MINERALS) 600-800 MG-UNIT TABS Take 2 tablets by mouth in the morning and at bedtime.     Cholecalciferol  (VITAMIN D3 PO) Take 1 capsule by mouth daily.     clopidogrel  (PLAVIX ) 75 MG tablet Take 75 mg by mouth daily.     denosumab (PROLIA) 60 MG/ML SOSY injection      diphenoxylate -atropine  (LOMOTIL ) 2.5-0.025 MG tablet Take 1 tablet by mouth every 6 (six) hours as needed for diarrhea or loose stools. 60 tablet 1   ezetimibe  (ZETIA ) 10 MG tablet Take 1 tablet (10 mg total) by mouth daily. 90 tablet 3   metoprolol  tartrate (LOPRESSOR ) 25 MG tablet Take 1 tablet (25 mg total) by mouth 2 (two) times daily. 30 tablet 0   mycophenolate  (CELLCEPT ) 250 MG capsule Take 250 mg by mouth 2 (two) times daily.     Probiotic Product (ALIGN) 10 MG CAPS Take 1 capsule by  mouth daily.     Specialty Vitamins Products (MG PLUS PROTEIN) 133 MG TABS Take 3 tablets by mouth in the morning and at bedtime.     tacrolimus  (PROGRAF ) 1 MG capsule Take 3 mg by mouth 2 (two) times daily.     No current facility-administered medications for this visit.     Assessment & Plan    Hyperlipidemia Assessment: Patient with ASCVD not at LDL goal of < 55 Most recent LDL 75 on 02/10/24 Not able to tolerate statins secondary to myalgias (atorvastatin, rosuvastatin) Reviewed options for lowering LDL cholesterol, including PCSK-9 inhibitors and inclisiran.  Discussed mechanisms of action, dosing, side effects, potential decreases in LDL cholesterol and costs.  Also reviewed potential options for patient assistance. Triglycerides elevated at  489 Plan: Patient agreeable to starting icosapent ethyl for triglycerides.  Fenofibrate contraindicated secondary to liver transplant Patient agreeable to starting Leqvio.  Note that patient has reviewed this with transplant team at [facility] and was told okay to use.   Repeat labs after:  4 months Lipid Liver function Patient was given information on [employer] - will sign patient up if high deductible/copays on icoaspent ethyl when PA approved Marital status Income < $75,000 (single) or < $105,000 (married)   Donivan Furry, PharmD CPP [facility] [address]  [place], [place] [address] [phone]  04/18/2024, 12:42 PM

## 2024-04-20 ENCOUNTER — Other Ambulatory Visit (HOSPITAL_COMMUNITY): Payer: Self-pay

## 2024-04-20 ENCOUNTER — Inpatient Hospital Stay: Payer: PRIVATE HEALTH INSURANCE | Admitting: Neurology

## 2024-04-20 ENCOUNTER — Encounter: Payer: Self-pay | Admitting: Pharmacy Technician

## 2024-04-20 ENCOUNTER — Telehealth: Payer: Self-pay | Admitting: Pharmacy Technician

## 2024-04-20 DIAGNOSIS — E782 Mixed hyperlipidemia: Secondary | ICD-10-CM

## 2024-04-20 NOTE — Telephone Encounter (Signed)
 Patient Advocate Encounter   The patient was approved for a Healthwell grant that will help cover the cost of vascepa Total amount awarded, 2500.00.  Effective: 03/21/24 - 03/20/25   [id number] [id number] Healthwell ID: [id number]   Pharmacy provided with approval and processing information. Patient informed via [facility]

## 2024-04-20 NOTE — Telephone Encounter (Signed)
Yes please, and thank you

## 2024-05-01 NOTE — Telephone Encounter (Signed)
 Called and left a message that pt haven't seen Dr. Jonelle Neri before.    Copied from CRM [id number]. Topic: General - Other >> May 01, 2024 11:37 AM Howard Macho wrote: Reason for CRM: Lupe from [facility] called wanting to confirm that the patient doctor is Dawn Moon and to see If the patient still on blood thinners CB [phone] option 2

## 2024-05-04 ENCOUNTER — Encounter: Payer: Self-pay | Admitting: Pharmacist Clinician (PhC)/ Clinical Pharmacy Specialist

## 2024-05-05 MED ORDER — ICOSAPENT ETHYL 1 G PO CAPS: 2.0000 g | ORAL_CAPSULE | Freq: Two times a day (BID) | ORAL | 5 refills | Status: DC

## 2024-05-05 NOTE — Addendum Note (Signed)
 Addended by: Sunny English on: 05/05/2024 07:38 AM   Modules accepted: Orders

## 2024-05-08 ENCOUNTER — Telehealth: Payer: Self-pay

## 2024-05-08 ENCOUNTER — Other Ambulatory Visit: Payer: Self-pay | Admitting: Pharmacist Clinician (PhC)/ Clinical Pharmacy Specialist

## 2024-05-08 NOTE — Telephone Encounter (Signed)
 Dr. Crenshaw and Kristin, patient will be scheduled as soon as possible.  Auth Submission: NO AUTH NEEDED Site of care: Site of care: [facility] Payer: Medicare A/B with AARP supplement Medication & CPT/J Code(s) submitted: Leqvio (Inclisiran) [phone] Route of submission (phone, fax, portal):  Phone # Fax # Auth type: Buy/Bill PB Units/visits requested: 284mg  x 2 doses Reference number:  Approval from: 05/08/24 to 01/20/25  Medicare will cover 80%, AARP will cover the remaining 20%.

## 2024-05-16 ENCOUNTER — Encounter: Payer: Self-pay | Admitting: Cardiology

## 2024-05-16 ENCOUNTER — Ambulatory Visit (INDEPENDENT_AMBULATORY_CARE_PROVIDER_SITE_OTHER)

## 2024-05-16 VITALS — BP 148/84 | HR 63 | Temp 98.2°F | Resp 16 | Ht 63.0 in | Wt 186.0 lb

## 2024-05-16 DIAGNOSIS — E782 Mixed hyperlipidemia: Secondary | ICD-10-CM

## 2024-05-16 MED ORDER — INCLISIRAN SODIUM 284 MG/1.5ML ~~LOC~~ SOSY
284.0000 mg | PREFILLED_SYRINGE | Freq: Once | SUBCUTANEOUS | Status: AC
  Administered 2024-05-16: 284 mg via SUBCUTANEOUS
  Filled 2024-05-16: qty 1.5

## 2024-05-16 NOTE — Progress Notes (Signed)
 Diagnosis: Hyperlipidemia  Provider:  Mannam, Praveen MD  Procedure: Injection  Leqvio  (inclisiran), Dose: 284 mg, Site: subcutaneous, Number of injections: 1  Injection Site(s): Right lower quad. abdomne  Post Care: Observation period completed  Discharge: Condition: Good, Destination: Home . AVS Declined  Performed by:  Lauran Pollard, LPN

## 2024-05-17 ENCOUNTER — Emergency Department (HOSPITAL_COMMUNITY)

## 2024-05-17 ENCOUNTER — Emergency Department (HOSPITAL_BASED_OUTPATIENT_CLINIC_OR_DEPARTMENT_OTHER)

## 2024-05-17 ENCOUNTER — Other Ambulatory Visit: Payer: Self-pay

## 2024-05-17 ENCOUNTER — Emergency Department (HOSPITAL_BASED_OUTPATIENT_CLINIC_OR_DEPARTMENT_OTHER)
Admission: EM | Admit: 2024-05-17 | Discharge: 2024-05-17 | Disposition: A | Discharge status: Home / Self Care | Attending: Emergency Medicine | Admitting: Emergency Medicine

## 2024-05-17 ENCOUNTER — Ambulatory Visit: Admitting: Gastroenterology

## 2024-05-17 ENCOUNTER — Encounter (HOSPITAL_BASED_OUTPATIENT_CLINIC_OR_DEPARTMENT_OTHER): Payer: Self-pay | Admitting: Emergency Medicine

## 2024-05-17 DIAGNOSIS — R42 Dizziness and giddiness: Secondary | ICD-10-CM | POA: Diagnosis present

## 2024-05-17 DIAGNOSIS — R519 Headache, unspecified: Secondary | ICD-10-CM | POA: Insufficient documentation

## 2024-05-17 DIAGNOSIS — Z7901 Long term (current) use of anticoagulants: Secondary | ICD-10-CM | POA: Diagnosis not present

## 2024-05-17 DIAGNOSIS — I10 Essential (primary) hypertension: Secondary | ICD-10-CM | POA: Diagnosis not present

## 2024-05-17 DIAGNOSIS — Z79899 Other long term (current) drug therapy: Secondary | ICD-10-CM | POA: Diagnosis not present

## 2024-05-17 LAB — CBC
HCT: 41.4 % (ref 36.0–46.0)
Hemoglobin: 13 g/dL (ref 12.0–15.0)
MCH: 25.9 pg — ABNORMAL LOW (ref 26.0–34.0)
MCHC: 31.4 g/dL (ref 30.0–36.0)
MCV: 82.5 fL (ref 80.0–100.0)
Platelets: 192 10*3/uL (ref 150–400)
RBC: 5.02 MIL/uL (ref 3.87–5.11)
RDW: 14.3 % (ref 11.5–15.5)
WBC: 6.4 10*3/uL (ref 4.0–10.5)
nRBC: 0 % (ref 0.0–0.2)

## 2024-05-17 LAB — URINALYSIS, ROUTINE W REFLEX MICROSCOPIC
Bacteria, UA: NONE SEEN
Bilirubin Urine: NEGATIVE
Glucose, UA: NEGATIVE mg/dL
Hgb urine dipstick: NEGATIVE
Ketones, ur: 15 mg/dL — AB
Nitrite: NEGATIVE
Specific Gravity, Urine: 1.025 (ref 1.005–1.030)
pH: 5.5 (ref 5.0–8.0)

## 2024-05-17 LAB — COMPREHENSIVE METABOLIC PANEL WITH GFR
ALT: 9 U/L (ref 0–44)
AST: 19 U/L (ref 15–41)
Albumin: 4.5 g/dL (ref 3.5–5.0)
Alkaline Phosphatase: 61 U/L (ref 38–126)
Anion gap: 13 (ref 5–15)
BUN: 17 mg/dL (ref 8–23)
CO2: 21 mmol/L — ABNORMAL LOW (ref 22–32)
Calcium: 9.3 mg/dL (ref 8.9–10.3)
Chloride: 104 mmol/L (ref 98–111)
Creatinine, Ser: 1.09 mg/dL — ABNORMAL HIGH (ref 0.44–1.00)
GFR, Estimated: 55 mL/min — ABNORMAL LOW (ref >60)
Glucose, Bld: 119 mg/dL — ABNORMAL HIGH (ref 70–99)
Potassium: 5.1 mmol/L (ref 3.5–5.1)
Sodium: 138 mmol/L (ref 135–145)
Total Bilirubin: 0.7 mg/dL (ref 0.0–1.2)
Total Protein: 7.4 g/dL (ref 6.5–8.1)

## 2024-05-17 LAB — CBG MONITORING, ED: Glucose-Capillary: 111 mg/dL — ABNORMAL HIGH (ref 70–99)

## 2024-05-17 MED ORDER — MECLIZINE HCL 25 MG PO TABS
25.0000 mg | ORAL_TABLET | Freq: Once | ORAL | Status: AC
  Administered 2024-05-17: 25 mg via ORAL
  Filled 2024-05-17: qty 1

## 2024-05-17 MED ORDER — ONDANSETRON 4 MG PO TBDP: 4.0000 mg | ORAL_TABLET | Freq: Three times a day (TID) | ORAL | 0 refills | Status: AC | PRN

## 2024-05-17 MED ORDER — PREDNISONE 10 MG PO TABS: 20.0000 mg | ORAL_TABLET | Freq: Every day | ORAL | 0 refills | Status: DC

## 2024-05-17 MED ORDER — ONDANSETRON HCL 4 MG/2ML IJ SOLN
4.0000 mg | Freq: Once | INTRAMUSCULAR | Status: AC
  Administered 2024-05-17: 4 mg via INTRAVENOUS
  Filled 2024-05-17: qty 2

## 2024-05-17 MED ORDER — LACTATED RINGERS IV BOLUS
1000.0000 mL | Freq: Once | INTRAVENOUS | Status: AC
  Administered 2024-05-17: 1000 mL via INTRAVENOUS

## 2024-05-17 MED ORDER — PREDNISONE 10 MG PO TABS: 30.0000 mg | ORAL_TABLET | Freq: Every day | ORAL | 0 refills | Status: AC

## 2024-05-17 MED ORDER — PREDNISONE 20 MG PO TABS
40.0000 mg | ORAL_TABLET | Freq: Once | ORAL | Status: AC
  Administered 2024-05-17: 40 mg via ORAL
  Filled 2024-05-17: qty 2

## 2024-05-17 MED ORDER — ONDANSETRON HCL 4 MG/2ML IJ SOLN
4.0000 mg | Freq: Once | INTRAMUSCULAR | Status: DC
  Filled 2024-05-17: qty 2

## 2024-05-17 MED ORDER — IOHEXOL 350 MG/ML SOLN
100.0000 mL | Freq: Once | INTRAVENOUS | Status: AC | PRN
  Administered 2024-05-17: 100 mL via INTRAVENOUS

## 2024-05-17 MED ORDER — MECLIZINE HCL 25 MG PO TABS: 25.0000 mg | ORAL_TABLET | Freq: Three times a day (TID) | ORAL | 0 refills | Status: DC | PRN

## 2024-05-17 MED ORDER — ONDANSETRON 4 MG PO TBDP
4.0000 mg | ORAL_TABLET | Freq: Once | ORAL | Status: AC
  Administered 2024-05-17: 4 mg via ORAL
  Filled 2024-05-17: qty 1

## 2024-05-17 NOTE — Discharge Instructions (Addendum)
 Your test results today were reassuring.  Continue vestibular rehab: Meclizine  as needed, drink plenty fluids to stay hydrated.  Avoid activities that worsen your symptoms.  Return to the emergency department for any new or worsening symptoms of concern.

## 2024-05-17 NOTE — Progress Notes (Signed)
 15ml saline extravasated into pt's IV at the end of CT scan after contrast administration. ED RN Cortney notified and new IV placed.

## 2024-05-17 NOTE — ED Provider Notes (Signed)
 [place] EMERGENCY DEPARTMENT AT [facility]: Lambert Rhonda W Provider Note   CSN: [id number] Arrival date & time: 05/17/24  1026     History  Chief Complaint  Patient presents with   Dizziness    Dawn Moon is a 68 y.o. female.  68 year old female with complex medical history including status post liver transplant, CVA, GERD, hyperlipidemia, IBS, hypertension and osteoporosis presents with complaint of dizziness.  Patient states that her symptoms started on Monday or entheses 2 days ago).  States that she had a pressure in her left ear at that time after getting out of the swimming pool where she attended a water aerobics class.  Patient then developed a ringing in her right ear and dizziness the following day, Tuesday.  Began having vomiting last night, was unable to keep down any of her medications last night or this morning including her transplant medications.  Patient unable to control vomiting with Zofran  at home.  She denies changes in vision, speech, unilateral weakness or numbness.  States that she feels like her gait is altered because the room is spinning.  The symptoms are worse with changes in position.  Denies falls or injuries or recent illness.       Home Medications Prior to Admission medications   Medication Sig Start Date End Date Taking? Authorizing Provider  Calcium  Carbonate-Vit D-Min (CALTRATE 600+D PLUS MINERALS) 600-800 MG-UNIT TABS Take 2 tablets by mouth in the morning and at bedtime. 07/03/22   [provider]  Cholecalciferol  (VITAMIN D3 PO) Take 1 capsule by mouth daily.    [provider]  clopidogrel  (PLAVIX ) 75 MG tablet Take 75 mg by mouth daily. 03/06/24   [provider]  denosumab (PROLIA) 60 MG/ML SOSY injection     [provider]  diphenoxylate -atropine  (LOMOTIL ) 2.5-0.025 MG tablet Take 1 tablet by mouth every 6 (six) hours as needed for diarrhea or loose stools. 03/30/24   Edmonia Gottron, PA-C   ezetimibe  (ZETIA ) 10 MG tablet Take 1 tablet (10 mg total) by mouth daily. 10/26/23 10/20/24  Lenise Quince, MD  icosapent  Ethyl (VASCEPA ) 1 g capsule Take 2 capsules (2 g total) by mouth 2 (two) times daily. With food 05/05/24   Lenise Quince, MD  metoprolol  tartrate (LOPRESSOR ) 25 MG tablet Take 1 tablet (25 mg total) by mouth 2 (two) times daily. 11/24/22   Angiulli, Everlyn Hockey, PA-C  mycophenolate  (CELLCEPT ) 250 MG capsule Take 250 mg by mouth 2 (two) times daily. 08/05/22   [provider]  Probiotic Product (ALIGN) 10 MG CAPS Take 1 capsule by mouth daily.    [provider]  Specialty Vitamins Products (MG PLUS PROTEIN) 133 MG TABS Take 3 tablets by mouth in the morning and at bedtime. 07/06/22   [provider]  tacrolimus  (PROGRAF ) 1 MG capsule Take 3 mg by mouth 2 (two) times daily.    [provider]      Allergies    Atorvastatin and Rosuvastatin    Review of Systems   Review of Systems Negative except as per HPI Physical Exam Updated Vital Signs BP (!) 150/91 (BP Location: Right Arm)   Pulse 86   Temp 99.2 F (37.3 C) (Oral)   Resp 15   Ht 5\' 3"  (1.6 m)   Wt 82.1 kg   SpO2 98%   BMI 32.06 kg/m  Physical Exam Vitals and nursing note reviewed.  Constitutional:      General: She is not in acute distress.  Appearance: She is well-developed. She is not diaphoretic.  HENT:     Head: Normocephalic and atraumatic.     Mouth/Throat:     Mouth: Mucous membranes are moist.  Eyes:     General: No visual field deficit.    Extraocular Movements: Extraocular movements intact.     Pupils: Pupils are equal, round, and reactive to light.  Cardiovascular:     Rate and Rhythm: Normal rate and regular rhythm.     Pulses: Normal pulses.     Heart sounds: Normal heart sounds.  Pulmonary:     Effort: Pulmonary effort is normal.     Breath sounds: Normal breath sounds.  Abdominal:     Palpations: Abdomen is soft.     Tenderness: There is  no abdominal tenderness.  Musculoskeletal:     Cervical back: Neck supple.  Skin:    General: Skin is warm and dry.  Neurological:     Mental Status: She is alert and oriented to person, place, and time.     GCS: GCS eye subscore is 4. GCS verbal subscore is 5. GCS motor subscore is 6.     Cranial Nerves: No cranial nerve deficit, dysarthria or facial asymmetry.     Sensory: Sensation is intact.     Motor: Motor function is intact. No weakness, tremor or pronator drift.     Coordination: Coordination normal. Finger-Nose-Finger Test and Heel to [facility] Test normal. Rapid alternating movements normal.  Psychiatric:        Behavior: Behavior normal.     ED Results / Procedures / Treatments   Labs (all labs ordered are listed, but only abnormal results are displayed) Labs Reviewed  COMPREHENSIVE METABOLIC PANEL WITH GFR - Abnormal; Notable for the following components:      Result Value   CO2 21 (*)    Glucose, Bld 119 (*)    Creatinine, Ser 1.09 (*)    GFR, Estimated 55 (*)    All other components within normal limits  CBC - Abnormal; Notable for the following components:   MCH 25.9 (*)    All other components within normal limits  URINALYSIS, ROUTINE W REFLEX MICROSCOPIC - Abnormal; Notable for the following components:   APPearance HAZY (*)    Ketones, ur 15 (*)    Protein, ur TRACE (*)    Leukocytes,Ua TRACE (*)    All other components within normal limits  CBG MONITORING, ED - Abnormal; Notable for the following components:   Glucose-Capillary 111 (*)    All other components within normal limits    EKG None  Radiology CT Angio Head Neck W WO CM Result Date: 05/17/2024 CLINICAL DATA:  Neuro deficit, concern for stroke. Dizziness and vomiting since Monday night. Pressure and ringing in the ears. EXAM: CT ANGIOGRAPHY HEAD AND NECK WITH AND WITHOUT CONTRAST TECHNIQUE: Multidetector CT imaging of the head and neck was performed using the standard protocol during bolus  administration of intravenous contrast. Multiplanar CT image reconstructions and MIPs were obtained to evaluate the vascular anatomy. Carotid stenosis measurements (when applicable) are obtained utilizing NASCET criteria, using the distal internal carotid diameter as the denominator. RADIATION DOSE REDUCTION: This exam was performed according to the departmental dose-optimization program which includes automated exposure control, adjustment of the mA and/or kV according to patient size and/or use of iterative reconstruction technique. CONTRAST:  OMNIPAQUE  IOHEXOL  350 MG/ML SOLN COMPARISON:  MRI/MRA head 10/24/2023. FINDINGS: CT HEAD FINDINGS Brain: No acute intracranial hemorrhage. No CT evidence of acute  infarct. Remote infarct in the right corona radiata. Additional remote infarct in the left parietal periventricular white matter. Nonspecific hypoattenuation in the periventricular and subcortical white matter favored to reflect chronic microvascular ischemic changes. No edema, mass effect, or midline shift. The basilar cisterns are patent. Ventricles: Ventricles are normal in size and configuration. Vascular: No hyperdense vessel. Skull: No acute or aggressive finding. Sinuses/orbits: The visualized paranasal sinuses are clear. Orbits are symmetric. Other: Mastoid air cells are clear. CTA NECK FINDINGS Aortic arch: Standard configuration of the aortic arch. Imaged portion shows no evidence of aneurysm or dissection. No significant stenosis of the major arch vessel origins. Pulmonary arteries: As permitted by contrast timing, there are no filling defects in the visualized pulmonary arteries. Subclavian arteries: The subclavian arteries are patent bilaterally. Right carotid system: No evidence of dissection, stenosis (50% or greater), or occlusion. Minimal atherosclerosis at the carotid bifurcation. Left carotid system: No evidence of dissection, stenosis (50% or greater), or occlusion. Mild atherosclerosis at  the carotid bifurcation. Vertebral arteries: Codominant. No evidence of dissection, stenosis (50% or greater), or occlusion. Skeleton: No acute findings. Degenerative changes in the cervical spine. Irregularity of the T3 superior endplate with associated sclerosis. Degenerative changes of the mandibular condyles. Other neck: The visualized airway is patent. No cervical lymphadenopathy. Upper chest: Visualized lung apices are clear. Review of the MIP images confirms the above findings CTA HEAD FINDINGS ANTERIOR CIRCULATION: The intracranial ICAs are patent bilaterally. Mild atherosclerosis of the carotid siphons. No significant stenosis, proximal occlusion, aneurysm, or vascular malformation. MCAs: The middle cerebral arteries are patent bilaterally. ACAs: The anterior cerebral arteries are patent bilaterally. POSTERIOR CIRCULATION: No significant stenosis, proximal occlusion, aneurysm, or vascular malformation. PCAs: The posterior cerebral arteries are patent bilaterally. Pcomm: Not well visualized. SCAs: The superior cerebellar arteries are patent bilaterally. Basilar artery: Patent AICAs: Patent PICAs: Patent Vertebral arteries: The intracranial vertebral arteries are patent. Venous sinuses: As permitted by contrast timing, patent. Anatomic variants: None Review of the MIP images confirms the above findings IMPRESSION: No large vessel occlusion. No high-grade stenosis, aneurysm, or dissection of the arteries in the head and neck. No CT evidence of acute intracranial abnormality. Remote infarct in the right corona radiata. Additional small remote infarcts in the left parietal periventricular white matter. Chronic microvascular ischemic changes. Irregularity of the T3 superior endplate with associated sclerosis which appears similar to prior CT chest abdomen pelvis. Additional mild irregularity of the C7 superior endplate with anterior height loss. Recommend correlation with history of trauma and consider MRI for  further evaluation if clinically indicated. Electronically Signed   By: Denny Flack M.D.   On: 05/17/2024 15:38    Procedures Procedures    Medications Ordered in ED Medications  ondansetron  (ZOFRAN ) injection 4 mg (4 mg Intravenous Not Given 05/17/24 1646)  ondansetron  (ZOFRAN -ODT) disintegrating tablet 4 mg (has no administration in time range)  ondansetron  (ZOFRAN ) injection 4 mg (4 mg Intravenous Given 05/17/24 1254)  meclizine  (ANTIVERT ) tablet 25 mg (25 mg Oral Given 05/17/24 1254)  iohexol  (OMNIPAQUE ) 350 MG/ML injection 100 mL (100 mLs Intravenous Contrast Given 05/17/24 1340)    ED Course/ Medical Decision Making/ A&P                                 Medical Decision Making Amount and/or Complexity of Data Reviewed Labs: ordered. Radiology: ordered.  Risk Prescription drug management.   This patient presents to the ED for concern  of dizziness, nausea, vomiting, this involves an extensive number of treatment options, and is a complaint that carries with it a high risk of complications and morbidity.  The differential diagnosis includes but not limited to CVA, vertigo, organ rejection   Co morbidities / Chronic conditions that complicate the patient evaluation  Liver transplant, CVA, additional history as listed above   Additional history obtained:  Additional history obtained from EMR External records from outside source obtained and reviewed including records on file, spouse at bedside with additional history.   Lab Tests:  I Ordered, and personally interpreted labs.  The pertinent results include: CBC without significant findings.  CMP without significant findings.  Urinalysis with trace protein, trace leukocytes, no bacteria, no urinary symptoms.   Imaging Studies ordered:  I ordered imaging studies including CT angio head and neck I independently visualized and interpreted imaging which showed no acute findings I agree with the radiologist  interpretation   Cardiac Monitoring: / EKG:  The patient was maintained on a cardiac monitor.  I personally viewed and interpreted the cardiac monitored which showed an underlying rhythm of: Sinus rhythm, rate 87   Problem List / ED Course / Critical interventions / Medication management  68 year old female with past medical history of liver transplant and CVA, additional history in chart with complaint of dizziness.  There is definitely a positional component to her dizziness however given her history and negative workup, she was transferred to [facility] for MRI for further evaluation. I ordered medication including Zofran , Antivert  Reevaluation of the patient after these medicines showed that the patient nausea slightly improved, continues to feel dizzy I have reviewed the patients home medicines and have made adjustments as needed   Consultations Obtained:  I requested consultation with the ER attending, Dr. Adrain Alar,  and discussed lab and imaging findings as well as pertinent plan - they recommend: Agrees with plan to transfer to [facility] for MRI   Social Determinants of Health:  Lives with spouse   Test / Admission - Considered:  Transfer to [facility] for MRI.  Patient is aware if MRI is negative she could possibly be discharged to follow-up outpatient.         Final Clinical Impression(s) / ED Diagnoses Final diagnoses:  Dizziness  Nonintractable episodic headache, unspecified headache type    Rx / DC Orders ED Discharge Orders     None         Erna He 05/17/24 1723    Rosealee Concha, MD 05/17/24 2051

## 2024-05-17 NOTE — ED Notes (Signed)
 Pt en route to [facility] ED for MRI

## 2024-05-17 NOTE — ED Provider Notes (Signed)
 Care of patient assumed from [facility].  Patient presented to [facility] with complaint of 2 days of dizziness.  This was associated with fullness and tinnitus in left ear after recent swimming.  She had some emesis last night and this morning.  She was treated with with meclizine  and Zofran .  Dizziness was noted to be positional.  She was answered to [facility] for MRI. Physical Exam  BP (!) 154/84 (BP Location: Right Arm)   Pulse 97   Temp 98 F (36.7 C) (Oral)   Resp 16   Ht 5\' 3"  (1.6 m)   Wt 82.1 kg   SpO2 99%   BMI 32.06 kg/m   Physical Exam Vitals and nursing note reviewed.  Constitutional:      General: She is not in acute distress.    Appearance: Normal appearance. She is well-developed. She is not ill-appearing, toxic-appearing or diaphoretic.  HENT:     Head: Normocephalic and atraumatic.     Right Ear: Tympanic membrane, ear canal and external ear normal.     Left Ear: Tympanic membrane, ear canal and external ear normal.     Nose: Nose normal.     Mouth/Throat:     Mouth: Mucous membranes are moist.  Eyes:     Extraocular Movements: Extraocular movements intact.     Conjunctiva/sclera: Conjunctivae normal.  Cardiovascular:     Rate and Rhythm: Normal rate and regular rhythm.  Pulmonary:     Effort: Pulmonary effort is normal. No respiratory distress.  Abdominal:     General: There is no distension.     Palpations: Abdomen is soft.     Tenderness: There is no abdominal tenderness.  Musculoskeletal:        General: No swelling. Normal range of motion.     Cervical back: Normal range of motion and neck supple.  Skin:    General: Skin is warm and dry.     Capillary Refill: Capillary refill takes less than 2 seconds.     Coloration: Skin is not jaundiced or pale.  Neurological:     General: No focal deficit present.     Mental Status: She is alert and oriented to person, place, and time.     Cranial Nerves: No cranial nerve deficit.     Sensory: No sensory  deficit.     Motor: No weakness.     Coordination: Coordination normal.  Psychiatric:        Mood and Affect: Mood normal.        Behavior: Behavior normal.     Procedures  Procedures  ED Course / MDM    Medical Decision Making Amount and/or Complexity of Data Reviewed Labs: ordered. Radiology: ordered.  Risk Prescription drug management.   On assessment, patient sitting on ED stretcher, resting comfortably.  She is well-appearing.  She does have worsening of dizziness with any movements of her head.  This does seem to be worsened to the right.  She does not have any focal neurologic deficits.  EAM's are normal in appearance bilaterally.  Patient has small amount of wax in left EAC.  Left TM does appear slightly dark but there is no clear evidence of bulging or erythema.  Patient underwent attempted Epley maneuver.  With Dix-Hallpike, she did not have extinguishing nystagmus.  Epley maneuver was ineffective.  Last dose of meclizine  was 7 hours ago.  Additional meclizine  was ordered in addition to IV fluids.  MRI was negative for acute findings.  Patient does have  improved symptoms with meclizine .  She was advised to continue vestibular rehab at home with meclizine  as needed and p.o. fluids.  She requested a steroid for treatment of inner ear inflammation.  Although it is not clear if this will help her symptoms, I feel that it is a reasonable request.  Patient was prescribed short course of prednisone .  She was discharged in stable condition.       Iva Mariner, MD 05/17/24 2126

## 2024-05-17 NOTE — ED Notes (Signed)
 Patient transported to MRI

## 2024-05-17 NOTE — ED Triage Notes (Signed)
 Pt c/o dizziness and vomiting since Monday night. Pt also c/o pressure and ringing in ears. Dizziness is described as "room is spinning" that is constant and worsens when laying flat. PMH Vertigo.

## 2024-05-17 NOTE — ED Notes (Signed)
 Patient transported to CT

## 2024-06-16 NOTE — Progress Notes (Signed)
 Subjective  Patient ID: Dawn Moon is a 68 y.o. female being seen for:  Chief Complaint  Patient presents with  . Tinnitus     HPI   68 year old female with a left sudden hearing loss and severe vertigo approximately 1 month ago.  She has never had this previously.  She reports that the vertigo slowly has improved and is mild at this time.  At the initial time that this occurred she had 1 week of prednisone  which upon her record review appears that this was a 10 mg dose for 5 days.  She also had had very sudden onset and severe left tinnitus associated with this and was seen in the emergency room at that time.  MRI done at [facility] was reviewed and was unremarkable.   Review of Systems: all relevant systems have been reviewed unless otherwise documented.  Medical History[1]  Surgical History[2]  Family History[3]  Allergies[4]   Objective  Physical Exam: General/Constitutional: Patient is a well-nourished, well-developed in no distress. Answers questions appropriately.  Skin/scalp : Normal and without lesions. No rashes, ulcerations or masses noted.  Head: No facial deformities  Eyes: Vision grossly intact. Normal extraocular movements. No nystagmus noted.  Ears: Right ear: Normal ear canal. Normal tympanic membrane. Left ear: Normal ear canal. Normal tympanic membrane.  Nose: Septum midline, turbinates slightly enlarged  Oral cavity and oropharynx: No concerning lesions in the oral cavity or pharynx.  Neck: No palpable masses or lesions    Assessment/Plan  1. Labyrinthitis of left ear (Primary) We discussed that she has symptoms and audiometric testing findings of left acute severe labyrinthitis.  The left ear has a severe to profound hearing loss with no word recognition and normal hearing in the right ear.  Her dizzy symptoms are improved and we discussed the potential role of vestibular therapy if not continuing to improve.  We discussed options for the  sudden hearing loss 1 week ago including observation, intratympanic steroid injections and additional oral steroids.  She would like to try oral steroids again which is not unreasonable given that she had a very low dose.  A high dose of prednisone  was sent in for 10 days and she was scheduled for follow-up audiometric testing in 1 month.  We also discussed the role of a CROS hearing aid if she does not have significant return in the hearing.   2. Tinnitus of left ear We discussed the nature of tinnitus, masking techniques, use of hearing aids and potential medical therapy for severe and refractory symptoms.    No orders of the defined types were placed in this encounter.   No follow-ups on file.   Electronically signed by: Arthea Fries, MD 06/16/2024 2:06 PM       [1] Past Medical History: Diagnosis Date  . Cerebrovascular accident    (CMD)   . Coronary artery disease   . Essential hypertension   . Hypomagnesemia   . Non-alcoholic cirrhosis    (CMD)   . Stage 3b chronic kidney disease (CKD) (CMD)   . Ventricular tachycardia    (CMD)   [2] History reviewed. No pertinent surgical history. [3] No family history on file. [4] Allergies Allergen Reactions  . Atorvastatin Myalgias and Other (See Comments)    Muscle pain and cramps  atorvastatin calcium   . Rosuvastatin Myalgias and Other (See Comments)    Muscle pain and cramps  . Simvastatin Myalgias and Other (See Comments)    Muscle aches and cramps.  Muscle aches and  cramps.  Muscle aches and cramps.  simvastatin  . Nsaids (Non-Steroidal Anti-Inflammatory Drug) Other (See Comments)    Non-steroidal anti-inflammatory agent (product)

## 2024-06-30 ENCOUNTER — Ambulatory Visit (INDEPENDENT_AMBULATORY_CARE_PROVIDER_SITE_OTHER): Admitting: Gastroenterology

## 2024-06-30 ENCOUNTER — Encounter: Payer: Self-pay | Admitting: Gastroenterology

## 2024-06-30 VITALS — BP 130/80 | HR 69 | Ht 63.0 in | Wt 190.8 lb

## 2024-06-30 DIAGNOSIS — K529 Noninfective gastroenteritis and colitis, unspecified: Secondary | ICD-10-CM

## 2024-06-30 DIAGNOSIS — R159 Full incontinence of feces: Secondary | ICD-10-CM | POA: Diagnosis not present

## 2024-06-30 DIAGNOSIS — K219 Gastro-esophageal reflux disease without esophagitis: Secondary | ICD-10-CM

## 2024-06-30 DIAGNOSIS — Z944 Liver transplant status: Secondary | ICD-10-CM

## 2024-06-30 DIAGNOSIS — K580 Irritable bowel syndrome with diarrhea: Secondary | ICD-10-CM | POA: Diagnosis not present

## 2024-06-30 DIAGNOSIS — Z8719 Personal history of other diseases of the digestive system: Secondary | ICD-10-CM

## 2024-06-30 MED ORDER — DIPHENOXYLATE-ATROPINE 2.5-0.025 MG PO TABS: 1.0000 | ORAL_TABLET | Freq: Four times a day (QID) | ORAL | 1 refills | Status: DC | PRN

## 2024-06-30 MED ORDER — CHOLESTYRAMINE 4 G PO PACK: 4.0000 g | PACK | Freq: Every day | ORAL | 3 refills | Status: DC

## 2024-06-30 NOTE — Patient Instructions (Addendum)
 We have sent the following medications to your pharmacy for you to pick up at your convenience: Questran  1 packet daily at lunch.  Lomotil    IBgard samples provided.   Call or send Mychart message in one month with update.   _______________________________________________________  If your blood pressure at your visit was 140/90 or greater, please contact your primary care physician to follow up on this.  _______________________________________________________  If you are age 7 or older, your body mass index should be between 23-30. Your Body mass index is 33.8 kg/m. If this is out of the aforementioned range listed, please consider follow up with your Primary Care Provider.  If you are age 35 or younger, your body mass index should be between 19-25. Your Body mass index is 33.8 kg/m. If this is out of the aformentioned range listed, please consider follow up with your Primary Care Provider.   ________________________________________________________  The [facility] providers would like to encourage you to use MYCHART to communicate with providers for non-urgent requests or questions.  Due to long hold times on the telephone, sending your provider a message by [facility] may be a faster and more efficient way to get a response.  Please allow 48 business hours for a response.  Please remember that this is for non-urgent requests.  _______________________________________________________

## 2024-06-30 NOTE — Progress Notes (Signed)
 06/30/2024 Dawn Moon [id number] 02-12-1956   HISTORY OF PRESENT ILLNESS: This is a pleasant 68 year old female who is a patient of Dr. Darilyn.  She follows here for chronic diarrhea.  Has history of liver transplant and still follows with [facility] for that.  Has had longstanding issues with diarrhea that seem to have worsened with time.  Feels like it has worsened around the time that she started the CellCept  for her transplant.  Has issues with fecal incontinence as well.  Has tried Xifaxan  in the past with minimal relief.  Previously on Viberzi  but discontinued due to symptoms resembling pancreatitis.  Currently he is using Lomotil  to help with her symptoms.  That does help some, but she is hoping for another potential solution.  She says that she is discussed with transplant about decreasing her CellCept  dosing, but they do not seem willing to do that.   Colonoscopy 11/2021: inflammation consistent with prep related changes, no diagnostic features of inflammatory bowel disease, microscopic colitis or colitis   PATHOLOGY Biopsy of terminal ileum: benign ulcer, negative for neoplasm Biopsy of cecum: ulcer, no diagnostic features of inflammatory bowel disease, possibly NSAID induced or prep induced      Past Medical History:  Diagnosis Date   Ascites    Dysrhythmia    hx of SVT   Encephalopathy, hepatic (HCC) 05/13/2018   GERD (gastroesophageal reflux disease)    History of exercise stress test    03-07-2010  Stress echo--- no arrhythmias or conduction abnormalilites and negative for ischemia or chest pain   History of kidney stones    History of paroxysmal supraventricular tachycardia    episode 2011  consult w/ dr fernande --  put on atenolol --  per pt no longer an issue   HTN (hypertension)    cardiologist --  dr pietro   IBS (irritable bowel syndrome)    diarrhea   Nonalcoholic steatohepatitis (NASH)    Numbness and tingling of left lower extremity    post  achilles tendon repair   OA (osteoarthritis)    knees   Osteoporosis    Pleural effusion on right    hepatic hydrothorax   PMB (postmenopausal bleeding) none recent   thickened endometrium   PONV (postoperative nausea and vomiting)    Recurrent UTI none since 2019   Secondary esophageal varices without bleeding (HCC) 06/03/2018   Vitamin D deficiency    Wears glasses    Past Surgical History:  Procedure Laterality Date   ACHILLES TENDON SURGERY Left 06/2016   BIOPSY  11/25/2021   Procedure: BIOPSY;  Surgeon: Avram Lupita BRAVO, MD;  Location: [facility];  Service: Endoscopy;;   BREAST BIOPSY Left 02/2016   benign   CARDIAC CATHETERIZATION     COLONOSCOPY  2005   COLONOSCOPY WITH PROPOFOL  N/A 11/25/2021   Procedure: COLONOSCOPY WITH PROPOFOL ;  Surgeon: Avram Lupita BRAVO, MD;  Location: [facility];  Service: Endoscopy;  Laterality: N/A;   CT CTA CORONARY W/CA SCORE W/CM &/OR WO/CM  01/01/2014   non-obstructive calcified plaque in pLAD (0-25%), no significant incidental noncardiac findings noted   DILATATION & CURETTAGE/HYSTEROSCOPY WITH MYOSURE N/A 12/02/2020   Procedure: DILATATION & CURETTAGE/HYSTEROSCOPY WITH POLYPECTOMY AND REMOVAL OF VAGINAL LESION;  Surgeon: Arnaldo Purchase, MD;  Location: [facility];  Service: Gynecology;  Laterality: N/A;  request to follow in [facility] iqueue held (DR. Arnaldo has two other cases that morning starting 7:30am)   ESOPHAGOGASTRODUODENOSCOPY  01/2014   EXTRACORPOREAL SHOCK WAVE LITHOTRIPSY  2010   KNEE ARTHROPLASTY Right 03/24/2018   Procedure: RIGHT TOTAL KNEE ARTHROPLASTY WITH COMPUTER NAVIGATION;  Surgeon: Fidel Rogue, MD;  Location: [facility];  Service: Orthopedics;  Laterality: Right;  Needs RNFA   KNEE ARTHROSCOPY Right 12/04/2016   Procedure: ARTHROSCOPY KNEE WITH PARTIAL MEDIAL MENISCECTOMY;  Surgeon: Rogue Fidel, MD;  Location: [facility];  Service: Orthopedics;  Laterality: Right;   LEFT HEART  CATH AND CORONARY ANGIOGRAPHY N/A 11/20/2021   Procedure: LEFT HEART CATH AND CORONARY ANGIOGRAPHY;  Surgeon: Verlin Lonni BIRCH, MD;  Location: [facility];  Service: Cardiovascular;  Laterality: N/A;   LEFT HEART CATHETERIZATION WITH CORONARY ANGIOGRAM N/A 01/11/2012   Procedure: LEFT HEART CATHETERIZATION WITH CORONARY ANGIOGRAM;  Surgeon: Ozell Fell, MD;  Location: [facility] C[facility];  Service: Cardiovascular;  Laterality: N/A;  widely patent coronary arterires without significant obstructive CAD,  normal LVF, ef 55-56%   LIVER TRANSPLANTATION  06/03/2022   TONSILLECTOMY AND ADENOIDECTOMY  child   TRANSTHORACIC ECHOCARDIOGRAM  01/18/2012   mild LVH,  ef 60%/  trivial TR   TUBAL LIGATION  1985   UPPER GASTROINTESTINAL ENDOSCOPY  last done 2019    reports that she has never smoked. She has never used smokeless tobacco. She reports that she does not currently use alcohol . She reports that she does not use drugs. family history includes Arthritis in her sister; Breast cancer in her maternal aunt; Celiac disease in her daughter; Coronary artery disease in her father and mother; Diabetes in her father; Healthy in her brother; Hypertension in her sister; IgA nephropathy in her daughter; Kidney failure in her daughter; Lymphoma in her mother. Allergies  Allergen Reactions   Atorvastatin Other (See Comments)    Muscle pain and cramps   Rosuvastatin Other (See Comments)    Muscle pain and cramps      Outpatient Encounter Medications as of 06/30/2024  Medication Sig   Psyllium (METAMUCIL 4 IN 1 FIBER PO) Take by mouth as needed.   Calcium  Carbonate-Vit D-Min (CALTRATE 600+D PLUS MINERALS) 600-800 MG-UNIT TABS Take 2 tablets by mouth in the morning and at bedtime.   Cholecalciferol  (VITAMIN D3 PO) Take 1 capsule by mouth daily.   clopidogrel  (PLAVIX ) 75 MG tablet Take 75 mg by mouth daily.   denosumab (PROLIA) 60 MG/ML SOSY injection    diphenoxylate -atropine  (LOMOTIL ) 2.5-0.025 MG  tablet Take 1 tablet by mouth every 6 (six) hours as needed for diarrhea or loose stools.   ezetimibe  (ZETIA ) 10 MG tablet Take 1 tablet (10 mg total) by mouth daily.   icosapent  Ethyl (VASCEPA ) 1 g capsule Take 2 capsules (2 g total) by mouth 2 (two) times daily. With food   meclizine  (ANTIVERT ) 25 MG tablet Take 1 tablet (25 mg total) by mouth 3 (three) times daily as needed for dizziness.   metoprolol  tartrate (LOPRESSOR ) 25 MG tablet Take 1 tablet (25 mg total) by mouth 2 (two) times daily.   mycophenolate  (CELLCEPT ) 250 MG capsule Take 250 mg by mouth 2 (two) times daily.   ondansetron  (ZOFRAN -ODT) 4 MG disintegrating tablet Take 1 tablet (4 mg total) by mouth every 8 (eight) hours as needed for nausea or vomiting.   Probiotic Product (ALIGN) 10 MG CAPS Take 1 capsule by mouth daily.   Specialty Vitamins Products (MG PLUS PROTEIN) 133 MG TABS Take 3 tablets by mouth in the morning and at bedtime.   tacrolimus  (PROGRAF ) 1 MG capsule Take 3 mg by mouth 2 (two) times daily.   No facility-administered  encounter medications on file as of 06/30/2024.    REVIEW OF SYSTEMS  : All other systems reviewed and negative except where noted in the History of Present Illness.   PHYSICAL EXAM: BP 130/80   Ht 5' 3 (1.6 m)   Wt 190 lb 12.8 oz (86.5 kg)   BMI 33.80 kg/m  General: Well developed white female in no acute distress Head: Normocephalic and atraumatic Eyes:  Sclerae anicteric, conjunctiva pink. Ears: Normal auditory acuity Lungs: Clear throughout to auscultation; no W/R/R. Heart: Regular rate and rhythm; no M/R/G. Abdomen: Soft, non-distended.  BS present.  Non-tender. Musculoskeletal: Symmetrical with no gross deformities  Skin: No lesions on visible extremities Neurological: Alert oriented x 4, grossly non-focal Psychological:  Alert and cooperative. Normal mood and affect  ASSESSMENT AND PLAN: IBS-D with chronic diarrhea and fecal incontinence:  Colonoscopy 11/2021 inflammation  ulcerated area cecum biopsies without features of IBD or microscopic colitis.  No Viberzi  as she does not have a gallbladder.  She has discussed with transplant team and they will not decrease her CellCept .  Maybe only minimal and short-lived improvement with Xifaxan .  Uses Lomotil  which does help transiently.  -Will try Questran  starting with 1 packet daily at lunchtime to be taken away from all of her other medications.  Prescription sent to pharmacy. - Will also refill her Lomotil  to be used as needed for now. - She will send us  an update via MyChart in about a month and let us  know how she is doing.  Status post liver transplant for Hollie cirrhosis June 2023, follows with [facility] On tacrolimus  and CellCept  250 mg twice a day   GERD 02/2010 EGD grade 2 varices, portal hypertensive gastropathy, no specimens   History of subacute right CVA 10/2023 Symptoms resolved other than mild arthralgia and left sided weakness Echo 60 to 65% follows with Dr. Rosemarie On Plavix   CC:  Teresa Aldona CROME, NP

## 2024-07-18 ENCOUNTER — Encounter: Payer: Self-pay | Admitting: Cardiology

## 2024-07-24 NOTE — Progress Notes (Unsigned)
 [facility] [address] [place]. [place] [address] [phone]       OFFICE FOLLOW-UP NOTE  Dawn Moon Date of Birth:  13-Apr-1956 Medical Record Number:  [id number]   HPI:   Update 07/25/2024 JM: Patient returns for 88-month follow-up visit.  She was seen in the ED back in May with onset of dizziness with left ear pressure and tinnitus with decreased hearing after water aerobics class and N/V the following day.  MRI brain negative for acute findings.  Noted positional component to dizziness.  She was treated with Zofran  and meclizine  with improvement of symptoms.  She was seen by PCP at discharge who referred her to ENT who noted labyrinthitis with profound left ear hearing loss and was prescribed high-dose steroid and recommended repeat hearing test in 1 month.  No new stroke/TIA symptoms.  Remains on Plavix , Zetia  and Vascepa , she was also started on Leqvio  in May through cardiology.      Consult visit 02/10/2024 Dr. Rosemarie: Dawn Moon is a pleasant 68 year old Caucasian lady seen today for initial office follow-up visit following hospital consultation for stroke in November 2024.  History is obtained from the patient and personally reviewed electronic medical records.  I personally reviewed pertinent available imaging films in PACS.he has past medical history of  NASH cirrhosis status post liver transplant in June 2023 on long-term immunosuppressants, hypertension, hyperlipidemia, history of statin intolerance, PSVT, gastroesophageal reflux disease, irritable bowel syndrome who presented on 10/24/2023 with sudden onset of slurred speech and left facial droop.  She presented outside time window for thrombolysis. NIH was 2 on admission for facial droop and dysarthria.  MRI scan showed acute to subacute infarct in the right corona radiator measuring 13 mm as well as small remote age infarcts in the deep white matter and chronic changes of small vessel disease.   MR angiogram of the brain showed no large vessel intracranial stenosis.  Carotid ultrasound showed no significant extracranial stenosis.  Transthoracic echo showed ejection fraction of 60-65%.  LDL cholesterol 98 mg percent.  Hemoglobin A1c was 5.3.  Telemetry monitoring during hospitalization did not reveal any atrial fibrillation.  Patient was discharged on dual antiplatelet therapy aspirin  and Plavix  for 3 weeks and then Plavix  alone.  Patient states she has done well.  Her facial droop and speech have improved completely back to baseline.  She has no residual deficits.  She denies any new recurrent stroke or TIA symptoms.  She is tolerating Plavix  well with minor bruising but no bleeding episodes.  She is on Zetia  for elevated lipids and tolerating it well but has not had any follow-up lipid profile checked.  She did have outpatient 30-day heart monitor which was negative for paroxysmal A-fib.  She is bothered by low back pain members who plans to do elective ablation.  Complaints today.  She denies any known prior history of stroke. ROS:   14 system review of systems is positive for bruising, weakness all other systems negative  PMH:  Past Medical History:  Diagnosis Date   Ascites    Dysrhythmia    hx of SVT   Encephalopathy, hepatic (HCC) 05/13/2018   GERD (gastroesophageal reflux disease)    History of exercise stress test    03-07-2010  Stress echo--- no arrhythmias or conduction abnormalilites and negative for ischemia or chest pain   History of kidney stones    History of paroxysmal supraventricular tachycardia    episode 2011  consult w/ dr fernande --  put on atenolol --  per pt no longer an issue   HTN (hypertension)    cardiologist --  dr pietro   IBS (irritable bowel syndrome)    diarrhea   Nonalcoholic steatohepatitis (NASH)    Numbness and tingling of left lower extremity    post achilles tendon repair   OA (osteoarthritis)    knees   Osteoporosis    Pleural effusion on  right    hepatic hydrothorax   PMB (postmenopausal bleeding) none recent   thickened endometrium   PONV (postoperative nausea and vomiting)    Recurrent UTI none since 2019   Secondary esophageal varices without bleeding (HCC) 06/03/2018   Vitamin D deficiency    Wears glasses     Social History:  Social History   Socioeconomic History   Marital status: Married    Spouse name: Not on file   Number of children: 3   Years of education: 16   Highest education level: Bachelor's degree (e.g., BA, AB, BS)  Occupational History   Occupation: inpatient case Event organiser: Advertising copywriter  Tobacco Use   Smoking status: Never   Smokeless tobacco: Never  Vaping Use   Vaping status: Never Used  Substance and Sexual Activity   Alcohol  use: Not Currently   Drug use: No   Sexual activity: Yes    Birth control/protection: Post-menopausal    Comment: 1st intercourse 68 yo-Fewer than 5 partners  Other Topics Concern   Not on file  Social History Narrative   Married, 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   Has daughters - one is former [facility] EP NP (Counselling psychologist) now working in Interior and spatial designer   rare EtOH, never smoker, no drugs   Social Drivers of Corporate investment banker Strain: Low Risk  (07/14/2024)   Received from [facility]   Overall Financial Resource Strain (CARDIA)    How hard is it for you to pay for the very basics like food, housing, medical care, and heating?: Not hard at all  Food Insecurity: No Food Insecurity (07/14/2024)   Received from [facility]   Hunger Vital Sign    Within the past 12 months, you worried that your food would run out before you got the money to buy more.: Never true    Within the past 12 months, the food you bought just didn't last and you didn't have money to get more.: Never true  Transportation Needs: No Transportation Needs (07/14/2024)   Received from [facility] - Transportation    In the past 12  months, has lack of transportation kept you from medical appointments or from getting medications?: No    In the past 12 months, has lack of transportation kept you from meetings, work, or from getting things needed for daily living?: No  Physical Activity: Insufficiently Active (07/14/2024)   Received from [facility]   Exercise Vital Sign    On average, how many days per week do you engage in moderate to strenuous exercise (like a brisk walk)?: 2 days    On average, how many minutes do you engage in exercise at this level?: 60 min  Stress: No Stress Concern Present (07/14/2024)   Received from [facility] - Occupational Stress Questionnaire    Do you feel stress - tense, restless, nervous, or anxious, or unable to sleep at night because your mind is troubled all the time - these  days?: Not at all  Social Connections: Moderately Integrated (07/14/2024)   Received from [facility]   Social Network    How would you rate your social network (family, work, friends)?: Adequate participation with social networks  Intimate Partner Violence: Not At Risk (07/14/2024)   Received from [facility]   HITS    Over the last 12 months how often did your partner physically hurt you?: Sometimes    Over the last 12 months how often did your partner insult you or talk down to you?: Rarely    Over the last 12 months how often did your partner threaten you with physical harm?: Never    Over the last 12 months how often did your partner scream or curse at you?: Never    Medications:   Current Outpatient Medications on File Prior to Visit  Medication Sig Dispense Refill   Calcium  Carbonate-Vit D-Min (CALTRATE 600+D PLUS MINERALS) 600-800 MG-UNIT TABS Take 2 tablets by mouth in the morning and at bedtime.     Cholecalciferol  (VITAMIN D3 PO) Take 1 capsule by mouth daily.     cholestyramine  (QUESTRAN ) 4 g packet Take 1 packet (4 g total) by mouth daily with lunch.  30 each 3   clopidogrel  (PLAVIX ) 75 MG tablet Take 75 mg by mouth daily.     denosumab (PROLIA) 60 MG/ML SOSY injection      diphenoxylate -atropine  (LOMOTIL ) 2.5-0.025 MG tablet Take 1 tablet by mouth every 6 (six) hours as needed for diarrhea or loose stools. 60 tablet 1   ezetimibe  (ZETIA ) 10 MG tablet Take 1 tablet (10 mg total) by mouth daily. 90 tablet 3   icosapent  Ethyl (VASCEPA ) 1 g capsule Take 2 capsules (2 g total) by mouth 2 (two) times daily. With food 120 capsule 5   meclizine  (ANTIVERT ) 25 MG tablet Take 1 tablet (25 mg total) by mouth 3 (three) times daily as needed for dizziness. 30 tablet 0   metoprolol  tartrate (LOPRESSOR ) 25 MG tablet Take 1 tablet (25 mg total) by mouth 2 (two) times daily. 30 tablet 0   mycophenolate  (CELLCEPT ) 250 MG capsule Take 250 mg by mouth 2 (two) times daily.     ondansetron  (ZOFRAN -ODT) 4 MG disintegrating tablet Take 1 tablet (4 mg total) by mouth every 8 (eight) hours as needed for nausea or vomiting. 20 tablet 0   Probiotic Product (ALIGN) 10 MG CAPS Take 1 capsule by mouth daily.     Psyllium (METAMUCIL 4 IN 1 FIBER PO) Take by mouth as needed.     Specialty Vitamins Products (MG PLUS PROTEIN) 133 MG TABS Take 3 tablets by mouth in the morning and at bedtime.     tacrolimus  (PROGRAF ) 1 MG capsule Take 3 mg by mouth 2 (two) times daily.     No current facility-administered medications on file prior to visit.    Allergies:   Allergies  Allergen Reactions   Atorvastatin Other (See Comments)    Muscle pain and cramps   Rosuvastatin Other (See Comments)    Muscle pain and cramps    Physical Exam There were no vitals filed for this visit. There is no height or weight on file to calculate BMI.   General: Pleasant mildly obese middle-age Caucasian lady seated, in no evident distress Head: head normocephalic and atraumatic.  Neck: supple with no carotid or supraclavicular bruits Cardiovascular: regular rate and rhythm, no  murmurs Musculoskeletal: no deformity Skin:  no rash/petichiae Vascular:  Normal pulses all extremities  Neurologic Exam  Mental Status: Awake and fully alert. Oriented to place and time. Recent and remote memory intact. Attention span, concentration and fund of knowledge appropriate. Mood and affect appropriate.  Cranial Nerves: Pupils equal, briskly reactive to light. Extraocular movements full without nystagmus. Visual fields full to confrontation. Hearing intact. Facial sensation intact. Face, tongue, palate moves normally and symmetrically.  Motor: Normal bulk and tone. Normal strength in all tested extremity muscles.  Mild diminished fine finger movements on the left.  Orbits right over left upper extremity. Sensory.: intact to touch ,pinprick .position and vibratory sensation.  Coordination: Rapid alternating movements normal in all extremities. Finger-to-nose and heel-to-shin performed accurately bilaterally. Gait and Station: Arises from chair without difficulty. Stance is normal. Gait demonstrates normal stride length and balance . Able to heel, toe and tandem walk with only slight difficulty.  Reflexes: 1+ and symmetric. Toes downgoing.       ASSESSMENT: 68 year old Caucasian lady with right subcortical lacunar infarct in November 2024.SABRA  Doing very well with no residual deficits.  Stroke risk factors of hypertension, hyperlipidemia, silent cerebrovascular and mild obesity.     PLAN:  - Continue Plavix  75 mg daily, Zetia  and Vascepa  for secondary stroke prevention manner/prescribed by PCP/cardiology - Continue close PCP and cardiology follow-up for aggressive stroke risk factor management       I personally spent a total of *** minutes in the care of the patient today including {Time Based Coding:210964241}.  Harlene Bogaert, AGNP-BC  [facility] [address] [place], [place] [address]  Phone [phone] Fax [phone] Note: This  document was prepared with digital dictation and possible smart phrase technology. Any transcriptional errors that result from this process are unintentional.

## 2024-07-25 ENCOUNTER — Encounter: Payer: Self-pay | Admitting: Adult Health

## 2024-07-25 ENCOUNTER — Ambulatory Visit (INDEPENDENT_AMBULATORY_CARE_PROVIDER_SITE_OTHER): Payer: PRIVATE HEALTH INSURANCE | Admitting: Adult Health

## 2024-07-25 VITALS — BP 157/85 | HR 70 | Ht 63.0 in | Wt 191.0 lb

## 2024-07-25 DIAGNOSIS — I639 Cerebral infarction, unspecified: Secondary | ICD-10-CM

## 2024-07-25 NOTE — Patient Instructions (Signed)
 Continue Plavix , Zetia , Vascepa  and Leqvio  for secondary stroke prevention manner/prescribed by PCP/cardiology  Continue to follow up with PCP and cardiology regarding blood pressure and cholesterol management  Maintain strict control of hypertension with blood pressure goal below 130/90 and cholesterol with LDL cholesterol (bad cholesterol) goal below 70 mg/dL.   Signs of a Stroke? Follow the BEFAST method:  Balance Watch for a sudden loss of balance, trouble with coordination or vertigo Eyes Is there a sudden loss of vision in one or both eyes? Or double vision?  Face: Ask the person to smile. Does one side of the face droop or is it numb?  Arms: Ask the person to raise both arms. Does one arm drift downward? Is there weakness or numbness of a leg? Speech: Ask the person to repeat a simple phrase. Does the speech sound slurred/strange? Is the person confused ? Time: If you observe any of these signs, call 911.        Thank you for coming to see us  at [facility]. I hope we have been able to provide you high quality care today.  You may receive a patient satisfaction survey over the next few weeks. We would appreciate your feedback and comments so that we may continue to improve ourselves and the health of our patients.

## 2024-08-14 ENCOUNTER — Ambulatory Visit: Payer: PRIVATE HEALTH INSURANCE | Admitting: Adult Health

## 2024-08-15 ENCOUNTER — Other Ambulatory Visit: Payer: Self-pay | Admitting: Internal Medicine

## 2024-08-15 MED ORDER — DIPHENOXYLATE-ATROPINE 2.5-0.025 MG PO TABS: ORAL_TABLET | ORAL | 3 refills | Status: DC

## 2024-08-16 ENCOUNTER — Ambulatory Visit (INDEPENDENT_AMBULATORY_CARE_PROVIDER_SITE_OTHER)

## 2024-08-16 VITALS — BP 151/85 | HR 64 | Temp 97.6°F | Resp 18 | Ht 63.0 in | Wt 186.8 lb

## 2024-08-16 DIAGNOSIS — E782 Mixed hyperlipidemia: Secondary | ICD-10-CM | POA: Diagnosis not present

## 2024-08-16 MED ORDER — INCLISIRAN SODIUM 284 MG/1.5ML ~~LOC~~ SOSY
284.0000 mg | PREFILLED_SYRINGE | Freq: Once | SUBCUTANEOUS | Status: AC
  Administered 2024-08-16: 284 mg via SUBCUTANEOUS
  Filled 2024-08-16: qty 1.5

## 2024-08-16 NOTE — Progress Notes (Signed)
 Diagnosis: Hyperlipidemia  Provider:  Mannam, Praveen MD  Procedure: Injection  Leqvio  (inclisiran), Dose: 284 mg, Site: subcutaneous, Number of injections: 1  Injection Site(s): Right upper quad. abdomen  Post Care:    Discharge: Condition: Good, Destination: Home . AVS Provided  Performed by:  Molina Hollenback, RN

## 2024-08-19 ENCOUNTER — Other Ambulatory Visit: Payer: Self-pay | Admitting: Cardiology

## 2024-09-06 NOTE — Progress Notes (Signed)
 HPI: FU CAD, hypertension and volume excess. CardioNet 2011 showed 4 beats of nonsustained ventricular tachycardia; also brief atrial tachycardia; note some of symptoms also noted to be sinus tachycardia. Patient was seen by Dr. Fernande and started on verapamil . Her symptoms did not improve and she was changed to a beta blocker. Her symptoms improved with atenolol .  Echocardiogram at [facility] October 2022 showed normal LV function and late positive microcavitation study consistent with extracardiac shunting.  Cardiac catheterization December 2022 showed 60% distal LAD.  Medical therapy recommended.  Patient underwent liver transplant at [facility] June 2023 (history of NASH).  Had CVA November 2024.  Carotid Dopplers November 2024 showed 1 to 39% right and left stenosis.  Echocardiogram November 2024 showed normal LV function, grade 1 diastolic dysfunction.  MRA of the head November 2024 normal.  Monitor December 2024 showed sinus rhythm with no atrial fibrillation noted.  CTA of the neck May 2025 showed no large vessel occlusion or high-grade stenosis.  Since last seen, she denies dyspnea, chest pain, palpitations or syncope.  Current Outpatient Medications  Medication Sig Dispense Refill   Calcium  Carbonate-Vit D-Min (CALTRATE 600+D PLUS MINERALS) 600-800 MG-UNIT TABS Take 1 tablet by mouth 1 day or 1 dose.     Cholecalciferol  (VITAMIN D3 PO) Take 1 capsule by mouth daily. (Patient taking differently: Take 2,000 Units by mouth daily.)     clopidogrel  (PLAVIX ) 75 MG tablet Take 75 mg by mouth daily.     denosumab (PROLIA) 60 MG/ML SOSY injection      diphenoxylate -atropine  (LOMOTIL ) 2.5-0.025 MG tablet 2 tablets bid and 2 more tablets prn diarrhea 180 tablet 3   ezetimibe  (ZETIA ) 10 MG tablet Take 1 tablet (10 mg total) by mouth daily. 90 tablet 3   icosapent  Ethyl (VASCEPA ) 1 g capsule TAKE 2 CAPSULES (2 G TOTAL) BY MOUTH 2 (TWO) TIMES DAILY. WITH FOOD 360 capsule 2   inclisiran  (LEQVIO ) 284 MG/1.5ML SOSY injection Inject 284 mg into the skin.     Magnesium  Oxide -Mg Supplement 400 MG CAPS Take 1 capsule by mouth daily. (Patient taking differently: Take 2 capsules by mouth in the morning, at noon, in the evening, and at bedtime.)     metoprolol  tartrate (LOPRESSOR ) 25 MG tablet Take 1 tablet (25 mg total) by mouth 2 (two) times daily. 30 tablet 0   mycophenolate  (CELLCEPT ) 250 MG capsule Take 250 mg by mouth 2 (two) times daily.     ondansetron  (ZOFRAN -ODT) 4 MG disintegrating tablet Take 1 tablet (4 mg total) by mouth every 8 (eight) hours as needed for nausea or vomiting. 20 tablet 0   Probiotic Product (ALIGN) 10 MG CAPS Take 1 capsule by mouth daily.     Specialty Vitamins Products (MG PLUS PROTEIN) 133 MG TABS Take 3 tablets by mouth in the morning and at bedtime.     tacrolimus  (PROGRAF ) 1 MG capsule Take 3 mg by mouth 2 (two) times daily.     cholestyramine  (QUESTRAN ) 4 g packet Take 1 packet (4 g total) by mouth daily with lunch. 30 each 3   meclizine  (ANTIVERT ) 25 MG tablet Take 1 tablet (25 mg total) by mouth 3 (three) times daily as needed for dizziness. 30 tablet 0   Psyllium (METAMUCIL 4 IN 1 FIBER PO) Take by mouth as needed.     No current facility-administered medications for this visit.     Past Medical History:  Diagnosis Date   Ascites    Dysrhythmia  hx of SVT   Encephalopathy, hepatic (HCC) 05/13/2018   GERD (gastroesophageal reflux disease)    History of exercise stress test    03-07-2010  Stress echo--- no arrhythmias or conduction abnormalilites and negative for ischemia or chest pain   History of kidney stones    History of paroxysmal supraventricular tachycardia    episode 2011  consult w/ dr fernande --  put on atenolol --  per pt no longer an issue   HTN (hypertension)    cardiologist --  dr pietro   IBS (irritable bowel syndrome)    diarrhea   Nonalcoholic steatohepatitis (NASH)    Numbness and tingling of left lower extremity     post achilles tendon repair   OA (osteoarthritis)    knees   Osteoporosis    Pleural effusion on right    hepatic hydrothorax   PMB (postmenopausal bleeding) none recent   thickened endometrium   PONV (postoperative nausea and vomiting)    Recurrent UTI none since 2019   Secondary esophageal varices without bleeding (HCC) 06/03/2018   Vitamin D deficiency    Wears glasses     Past Surgical History:  Procedure Laterality Date   ACHILLES TENDON SURGERY Left 06/2016   BIOPSY  11/25/2021   Procedure: BIOPSY;  Surgeon: Avram Lupita BRAVO, MD;  Location: [facility];  Service: Endoscopy;;   BREAST BIOPSY Left 02/2016   benign   CARDIAC CATHETERIZATION     COLONOSCOPY  2005   COLONOSCOPY WITH PROPOFOL  N/A 11/25/2021   Procedure: COLONOSCOPY WITH PROPOFOL ;  Surgeon: Avram Lupita BRAVO, MD;  Location: [facility];  Service: Endoscopy;  Laterality: N/A;   CT CTA CORONARY W/CA SCORE W/CM &/OR WO/CM  01/01/2014   non-obstructive calcified plaque in pLAD (0-25%), no significant incidental noncardiac findings noted   DILATATION & CURETTAGE/HYSTEROSCOPY WITH MYOSURE N/A 12/02/2020   Procedure: DILATATION & CURETTAGE/HYSTEROSCOPY WITH POLYPECTOMY AND REMOVAL OF VAGINAL LESION;  Surgeon: Arnaldo Purchase, MD;  Location: [facility];  Service: Gynecology;  Laterality: N/A;  request to follow in [facility] iqueue held (DR. Arnaldo has two other cases that morning starting 7:30am)   ESOPHAGOGASTRODUODENOSCOPY  01/2014   EXTRACORPOREAL SHOCK WAVE LITHOTRIPSY  2010   KNEE ARTHROPLASTY Right 03/24/2018   Procedure: RIGHT TOTAL KNEE ARTHROPLASTY WITH COMPUTER NAVIGATION;  Surgeon: Fidel Rogue, MD;  [facility]: [facility];  Service: Orthopedics;  Laterality: Right;  Needs RNFA   KNEE ARTHROSCOPY Right 12/04/2016   Procedure: ARTHROSCOPY KNEE WITH PARTIAL MEDIAL MENISCECTOMY;  Surgeon: Rogue Fidel, MD;  Location: [facility] ;  Service: Orthopedics;  Laterality: Right;    LEFT HEART CATH AND CORONARY ANGIOGRAPHY N/A 11/20/2021   Procedure: LEFT HEART CATH AND CORONARY ANGIOGRAPHY;  Surgeon: Verlin Lonni BIRCH, MD;  [facility];  Service: Cardiovascular;  Laterality: N/A;   LEFT HEART CATHETERIZATION WITH CORONARY ANGIOGRAM N/A 01/11/2012   Procedure: LEFT HEART CATHETERIZATION WITH CORONARY ANGIOGRAM;  Surgeon: Ozell Fell, MD;  Location: [facility] CATH LAB;  Service: Cardiovascular;  Laterality: N/A;  widely patent coronary arterires without significant obstructive CAD,  normal LVF, ef 55-56%   LIVER TRANSPLANTATION  06/03/2022   TONSILLECTOMY AND ADENOIDECTOMY  child   TRANSTHORACIC ECHOCARDIOGRAM  01/18/2012   mild LVH,  ef 60%/  trivial TR   TUBAL LIGATION  1985   UPPER GASTROINTESTINAL ENDOSCOPY  last done 2019    Social History   Socioeconomic History   Marital status: Married    Spouse name: Not on file   Number of  children: 3   Years of education: 16   Highest education level: Bachelor's degree (e.g., BA, AB, BS)  Occupational History   Occupation: inpatient case Event organiser: Advertising copywriter  Tobacco Use   Smoking status: Never   Smokeless tobacco: Never  Vaping Use   Vaping status: Never Used  Substance and Sexual Activity   Alcohol  use: Not Currently   Drug use: No   Sexual activity: Yes    Birth control/protection: Post-menopausal    Comment: 1st intercourse 68 yo-Fewer than 5 partners  Other Topics Concern   Not on file  Social History Narrative   Married, 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   Has daughters - one is former [facility] EP NP (Counselling psychologist) now working in Interior and spatial designer   rare EtOH, never smoker, no drugs   Social Drivers of Corporate investment banker Strain: Low Risk  (07/14/2024)   Received from [facility]   Overall Financial Resource Strain (CARDIA)    How hard is it for you to pay for the very basics like food, housing, medical care, and heating?: Not hard at all   Food Insecurity: No Food Insecurity (07/14/2024)   Received from [facility]   Hunger Vital Sign    Within the past 12 months, you worried that your food would run out before you got the money to buy more.: Never true    Within the past 12 months, the food you bought just didn't last and you didn't have money to get more.: Never true  Transportation Needs: No Transportation Needs (07/14/2024)   Received from [facility] - Transportation    In the past 12 months, has lack of transportation kept you from medical appointments or from getting medications?: No    In the past 12 months, has lack of transportation kept you from meetings, work, or from getting things needed for daily living?: No  Physical Activity: Insufficiently Active (07/14/2024)   Received from [facility]   Exercise Vital Sign    On average, how many days per week do you engage in moderate to strenuous exercise (like a brisk walk)?: 2 days    On average, how many minutes do you engage in exercise at this level?: 60 min  Stress: No Stress Concern Present (07/14/2024)   Received from [facility] - Occupational Stress Questionnaire    Do you feel stress - tense, restless, nervous, or anxious, or unable to sleep at night because your mind is troubled all the time - these days?: Not at all  Social Connections: Moderately Integrated (07/14/2024)   Received from [facility]   Social Network    How would you rate your social network (family, work, friends)?: Adequate participation with social networks  Intimate Partner Violence: Not At Risk (07/14/2024)   Received from [facility]   HITS    Over the last 12 months how often did your partner physically hurt you?: Sometimes    Over the last 12 months how often did your partner insult you or talk down to you?: Rarely    Over the last 12 months how often did your partner threaten you with physical harm?: Never    Over the  last 12 months how often did your partner scream or curse at you?: Never    Family History  Problem Relation Age of Onset   Lymphoma Mother  non-hodgkins - died @ 63   Coronary artery disease Mother    Coronary artery disease Father        CABG in his 46s, died @ 46   Diabetes Father    Healthy Brother    Hypertension Sister    Arthritis Sister    IgA nephropathy Daughter    Kidney failure Daughter    Celiac disease Daughter    Breast cancer Maternal Aunt    Colon cancer Neg Hx    Esophageal cancer Neg Hx    Stomach cancer Neg Hx    Rectal cancer Neg Hx    Ovarian cancer Neg Hx    Cervical cancer Neg Hx     ROS: no fevers or chills, productive cough, hemoptysis, dysphasia, odynophagia, melena, hematochezia, dysuria, hematuria, rash, seizure activity, orthopnea, PND, pedal edema, claudication. Remaining systems are negative.  Physical Exam: Well-developed well-nourished in no acute distress.  Skin is warm and dry.  HEENT is normal.  Neck is supple.  Chest is clear to auscultation with normal expansion.  Cardiovascular exam is regular rate and rhythm.  Abdominal exam nontender or distended. No masses palpated. Extremities show no edema. neuro grossly intact   A/P  1 coronary artery disease-patient denies chest pain.  Continue Plavix .  She is intolerant to statins.  2 hypertension-blood pressure elevated.  However she follows this at home and it is typically controlled.  She will follow and contact us  if it runs higher.  3 prior CVA-continue Plavix .  4 palpitations-continue beta-blocker.  No recent symptoms.  5 hyperlipidemia-continue Zetia , inclisiran and Vascepa .  She is intolerant to statins.  Check lipids and liver.  6 status post liver transplant-followed at [facility].  Redell Shallow, MD

## 2024-09-13 ENCOUNTER — Ambulatory Visit: Attending: Cardiology | Admitting: Cardiology

## 2024-09-13 ENCOUNTER — Encounter: Payer: Self-pay | Admitting: Cardiology

## 2024-09-13 VITALS — BP 155/95 | HR 70 | Ht 63.0 in | Wt 185.0 lb

## 2024-09-13 DIAGNOSIS — I2510 Atherosclerotic heart disease of native coronary artery without angina pectoris: Secondary | ICD-10-CM | POA: Diagnosis present

## 2024-09-13 DIAGNOSIS — E782 Mixed hyperlipidemia: Secondary | ICD-10-CM | POA: Insufficient documentation

## 2024-09-13 DIAGNOSIS — I10 Essential (primary) hypertension: Secondary | ICD-10-CM | POA: Diagnosis not present

## 2024-09-13 NOTE — Patient Instructions (Signed)

## 2024-09-14 ENCOUNTER — Ambulatory Visit: Payer: Self-pay | Admitting: Cardiology

## 2024-09-14 LAB — HEPATIC FUNCTION PANEL
ALT: 43 IU/L — ABNORMAL HIGH (ref 0–32)
AST: 34 IU/L (ref 0–40)
Albumin: 4.6 g/dL (ref 3.9–4.9)
Alkaline Phosphatase: 65 IU/L (ref 49–135)
Bilirubin Total: 0.7 mg/dL (ref 0.0–1.2)
Bilirubin, Direct: 0.25 mg/dL (ref 0.00–0.40)
Total Protein: 6.9 g/dL (ref 6.0–8.5)

## 2024-09-14 LAB — LIPID PANEL
Chol/HDL Ratio: 2.2 ratio (ref 0.0–4.4)
Cholesterol, Total: 114 mg/dL (ref 100–199)
HDL: 51 mg/dL (ref >39)
LDL Chol Calc (NIH): 30 mg/dL (ref 0–99)
Triglycerides: 212 mg/dL — ABNORMAL HIGH (ref 0–149)
VLDL Cholesterol Cal: 33 mg/dL (ref 5–40)

## 2024-11-08 ENCOUNTER — Other Ambulatory Visit (HOSPITAL_BASED_OUTPATIENT_CLINIC_OR_DEPARTMENT_OTHER): Payer: Self-pay

## 2024-11-08 ENCOUNTER — Encounter: Payer: Self-pay | Admitting: Cardiology

## 2024-11-08 MED ORDER — FLUZONE HIGH-DOSE 0.5 ML IM SUSY
0.5000 mL | PREFILLED_SYRINGE | Freq: Once | INTRAMUSCULAR | 0 refills | Status: AC
  Filled 2024-11-08: qty 0.5, 1d supply, fill #0

## 2024-11-08 MED ORDER — COMIRNATY 30 MCG/0.3ML IM SUSY
0.3000 mL | PREFILLED_SYRINGE | Freq: Once | INTRAMUSCULAR | 0 refills | Status: AC
  Filled 2024-11-08: qty 0.3, 1d supply, fill #0

## 2024-12-01 ENCOUNTER — Ambulatory Visit: Admitting: Internal Medicine

## 2024-12-01 ENCOUNTER — Encounter: Payer: Self-pay | Admitting: Internal Medicine

## 2024-12-01 VITALS — BP 100/64 | HR 80 | Ht 63.0 in | Wt 188.4 lb

## 2024-12-01 DIAGNOSIS — R1013 Epigastric pain: Secondary | ICD-10-CM

## 2024-12-01 DIAGNOSIS — K6289 Other specified diseases of anus and rectum: Secondary | ICD-10-CM

## 2024-12-01 DIAGNOSIS — R159 Full incontinence of feces: Secondary | ICD-10-CM

## 2024-12-01 DIAGNOSIS — K580 Irritable bowel syndrome with diarrhea: Secondary | ICD-10-CM

## 2024-12-01 NOTE — Progress Notes (Signed)
 Dawn Moon 68 y.o. 10-Feb-1956 [id number]  Assessment & Plan:   Encounter Diagnoses  Name Primary?   Irritable bowel syndrome with diarrhea Yes   Urinary and fecal incontinence    Anal sphincter incompetence    Dyspepsia    Diarrhea and fecal incontinence are improved.  She is significantly better after stopping CellCept  but still has her underlying IBS.  I am not sure what the terminal ileal changes in 2022 where, they were not diagnostic and I do not think I would pursue another colonoscopy at this point given her improvement and current status.  She will continue Lomotil  2 daily and as needed on top of that may need to schedule differently.  Refer to pelvic floor physical therapy to try to help with weak anal sphincter, urinary and fecal incontinence.  She may use antacids or consider Pepcid or Pepcid Complete as needed dyspepsia.  If that is a more persistent issue might need regular Pepcid.  She takes tacrolimus  and lansoprazole and omeprazole are generally avoided due to interaction and potential change in tacrolimus  levels.  Other PPIs are reportedly okay to use.  She is to contact me if she has persistent issues to address this.  She will follow-up as needed currently.   Subjective:   Chief Complaint: Diarrhea, IBS follow-up  HPI 68 year old woman status post orthotopic liver transplant June 2023 for MASH cirrhosis and longstanding IBS diarrhea predominant presents for follow-up.  Her husband is with her today.  Dawn Moon is much better after CellCept  has been discontinued, she is taking Lomotil  2 every morning and then 1 maybe 2 later in the day.  She has about 3 generally formed stools a day.  She still has some incontinence with a couple of occasions in the last month or so.  She also describes urinary incontinence with lifting something or coughing and sneezing.  She saw gynecology in 2023 have reviewed those notes, last pelvic ultrasound for ovarian cyst was  stable.  November 2023.  Yesterday she had a lot of epigastric and retrosternal burning, helped by Tums.  It is better today though not completely gone.  This is not a regular occurrence.  She is not on any acid suppression now.  She does not have any odynophagia or dysphagia.     Colonoscopy 11/25/2021 - Atrophic and vascular-pattern-decreased mucosa in the terminal ileum. Biopsied. She has a dx of IBS-D ? other - Vascular-pattern-decreased mucosa in the entire examined colon. Biopsied. IBS-D vs other? - Rectal varices. - Diverticulosis in the sigmoid colon and in the descending colon. - The examination was otherwise normal on direct and retroflexion views.  A. TERMINAL ILEUM, BIOPSY:  Benign ulcer with overlying polypoid granulation tissue in fragments of  colonic mucosa.  Ileal mucosa is not identified.  Negative for neoplasm.  Please see comment.   B. COLON, RANDOM, BIOPSY:  Mild nonspecific inflammation consistent with prep related changes.  There are no diagnostic features of inflammatory bowel disease,  microscopic colitis and collagenous colitis.   Comment: The ulcer in the biopsies of the cecum do not have diagnostic  features of inflammatory bowel disease.  Possibility of NSAID induced  ulcer should be considered.     EGD 02/29/2020 was on PPI Esophageal varices and portal gastropathy otherwise negative  Allergies[1] Active Medications[2] Past Medical History:  Diagnosis Date   Ascites    Dysrhythmia    hx of SVT   Encephalopathy, hepatic (HCC) 05/13/2018   GERD (gastroesophageal reflux disease)  History of exercise stress test    03-07-2010  Stress echo--- no arrhythmias or conduction abnormalilites and negative for ischemia or chest pain   History of kidney stones    History of paroxysmal supraventricular tachycardia    episode 2011  consult w/ dr fernande --  put on atenolol --  per pt no longer an issue   HTN (hypertension)    cardiologist --  dr pietro    IBS (irritable bowel syndrome)    diarrhea   Nonalcoholic steatohepatitis (NASH)    Numbness and tingling of left lower extremity    post achilles tendon repair   OA (osteoarthritis)    knees   Osteoporosis    Pleural effusion on right    hepatic hydrothorax   PMB (postmenopausal bleeding) none recent   thickened endometrium   PONV (postoperative nausea and vomiting)    Recurrent UTI none since 2019   Secondary esophageal varices without bleeding (HCC) 06/03/2018   Tinnitus    Vitamin D deficiency    Wears glasses    Past Surgical History:  Procedure Laterality Date   ACHILLES TENDON SURGERY Left 06/2016   BIOPSY  11/25/2021   Procedure: BIOPSY;  Surgeon: Avram Lupita BRAVO, MD;  [facility];  Service: Endoscopy;;   BREAST BIOPSY Left 02/2016   benign   CARDIAC CATHETERIZATION     COLONOSCOPY  2005   COLONOSCOPY WITH PROPOFOL  N/A 11/25/2021   Procedure: COLONOSCOPY WITH PROPOFOL ;  Surgeon: Avram Lupita BRAVO, MD;  [facility];  Service: Endoscopy;  Laterality: N/A;   CT CTA CORONARY W/CA SCORE W/CM &/OR WO/CM  01/01/2014   non-obstructive calcified plaque in pLAD (0-25%), no significant incidental noncardiac findings noted   DILATATION & CURETTAGE/HYSTEROSCOPY WITH MYOSURE N/A 12/02/2020   Procedure: DILATATION & CURETTAGE/HYSTEROSCOPY WITH POLYPECTOMY AND REMOVAL OF VAGINAL LESION;  Surgeon: Arnaldo Purchase, MD;  Location: [facility];  Service: Gynecology;  Laterality: N/A;  request to follow in [facility] iqueue held (DR. Arnaldo has two other cases that morning starting 7:30am)   ESOPHAGOGASTRODUODENOSCOPY  01/2014   EXTRACORPOREAL SHOCK WAVE LITHOTRIPSY  2010   KNEE ARTHROPLASTY Right 03/24/2018   Procedure: RIGHT TOTAL KNEE ARTHROPLASTY WITH COMPUTER NAVIGATION;  Surgeon: Fidel Rogue, MD;  [facility];  Service: Orthopedics;  Laterality: Right;  Needs RNFA   KNEE ARTHROSCOPY Right 12/04/2016   Procedure: ARTHROSCOPY KNEE WITH  PARTIAL MEDIAL MENISCECTOMY;  Surgeon: Rogue Fidel, MD;  Location: [facility];  Service: Orthopedics;  Laterality: Right;   LEFT HEART CATH AND CORONARY ANGIOGRAPHY N/A 11/20/2021   Procedure: LEFT HEART CATH AND CORONARY ANGIOGRAPHY;  Surgeon: Verlin Lonni BIRCH, MD;  [facility]: [facility];  Service: Cardiovascular;  Laterality: N/A;   LEFT HEART CATHETERIZATION WITH CORONARY ANGIOGRAM N/A 01/11/2012   Procedure: LEFT HEART CATHETERIZATION WITH CORONARY ANGIOGRAM;  Surgeon: Ozell Fell, MD;  Location: [facility] CATH LAB;  Service: Cardiovascular;  Laterality: N/A;  widely patent coronary arterires without significant obstructive CAD,  normal LVF, ef 55-56%   LIVER TRANSPLANTATION  06/03/2022   TONSILLECTOMY AND ADENOIDECTOMY  child   TRANSTHORACIC ECHOCARDIOGRAM  01/18/2012   mild LVH,  ef 60%/  trivial TR   TUBAL LIGATION  1985   UPPER GASTROINTESTINAL ENDOSCOPY  last done 2019   Social History   Social History Narrative   Married, 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   Review of Systems   Objective:   Physical Exam     [1]  Allergies Allergen Reactions   Atorvastatin Other (See Comments)    Muscle pain and cramps   Rosuvastatin Other (See Comments)    Muscle pain and cramps  [2]  Current Meds  Medication Sig   Calcium  Carbonate-Vit D-Min (CALTRATE 600+D PLUS MINERALS) 600-800 MG-UNIT TABS Take 1 tablet by mouth 1 day or 1 dose.   Cholecalciferol  (VITAMIN D3 PO) Take 1 capsule by  mouth daily. (Patient taking differently: Take 2,000 Units by mouth daily.)   clopidogrel  (PLAVIX ) 75 MG tablet Take 75 mg by mouth daily.   denosumab (PROLIA) 60 MG/ML SOSY injection    diphenoxylate -atropine  (LOMOTIL ) 2.5-0.025 MG tablet 2 tablets bid and 2 more tablets prn diarrhea   ezetimibe  (ZETIA ) 10 MG tablet Take 1 tablet (10 mg total) by mouth daily.   icosapent  Ethyl (VASCEPA ) 1 g capsule TAKE 2 CAPSULES (2 G TOTAL) BY MOUTH 2 (TWO) TIMES DAILY. WITH FOOD   inclisiran (LEQVIO ) 284 MG/1.5ML SOSY injection Inject 284 mg into the skin.   Magnesium  Oxide -Mg Supplement 400 MG CAPS Take 1 capsule by mouth daily. (Patient taking differently: Take 2 capsules by mouth 2 (two) times daily.)   metoprolol  tartrate (LOPRESSOR ) 25 MG tablet Take 1 tablet (25 mg total) by mouth 2 (two) times daily.   ondansetron  (ZOFRAN -ODT) 4 MG disintegrating tablet Take 1 tablet (4 mg total) by mouth every 8 (eight) hours as needed for nausea or vomiting.   Probiotic Product (ALIGN) 10 MG CAPS Take 1 capsule by mouth daily.   Specialty Vitamins Products (MG PLUS PROTEIN) 133 MG TABS Take 3 tablets by mouth in the morning and at bedtime. (Patient taking differently: Take 2 tablets by mouth in the morning and at bedtime.)   tacrolimus  (PROGRAF ) 1 MG capsule Take 3 mg by mouth 2 (two) times daily.

## 2024-12-01 NOTE — Patient Instructions (Addendum)
 We have placed a referral for pelvic floor PT. They will contact you about setting up an appointment.   You may use over the counter Pepcid or Pepcid Complete as needed for heartburn. If it becomes more of an issue for you contact us .   Great to see you today!!   I appreciate the opportunity to care for you. Lupita Commander, MD, [facility]

## 2024-12-18 ENCOUNTER — Other Ambulatory Visit: Payer: Self-pay | Admitting: Internal Medicine

## 2024-12-18 NOTE — Telephone Encounter (Signed)
 Patient seen earlier this month by Dr Avram and he said to continue Lomotil  2 daily and as needed. Can you please refill as this is a controlled rx. Thank you. Dr Avram is out this week.

## 2024-12-29 ENCOUNTER — Telehealth: Payer: Self-pay

## 2024-12-29 NOTE — Telephone Encounter (Signed)
 Auth Submission: NO AUTH NEEDED Site of care: Site of care: [facility] Payer: Medicare A/B with AARP supplement Medication & CPT/J Code(s) submitted: Leqvio  (Inclisiran) J1306 Diagnosis Code:  Route of submission (phone, fax, portal):  Phone # Fax # Auth type: Buy/Bill PB Units/visits requested: 284mg  x 2 doses Reference number:  Approval from: 12/29/24 to 01/20/26

## 2025-01-31 ENCOUNTER — Ambulatory Visit: Admitting: Physical Therapy

## 2025-02-16 ENCOUNTER — Ambulatory Visit
